# Patient Record
Sex: Female | Born: 1955 | Race: Black or African American | Hispanic: No | Marital: Married | Smoking: Never smoker
Health system: Southern US, Community
[De-identification: ages and names within clinical notes are randomized; demographics above are authoritative.]

## PROBLEM LIST (undated history)

## (undated) DIAGNOSIS — G8929 Other chronic pain: Secondary | ICD-10-CM

## (undated) DIAGNOSIS — I639 Cerebral infarction, unspecified: Secondary | ICD-10-CM

## (undated) DIAGNOSIS — R739 Hyperglycemia, unspecified: Secondary | ICD-10-CM

## (undated) DIAGNOSIS — F329 Major depressive disorder, single episode, unspecified: Secondary | ICD-10-CM

## (undated) DIAGNOSIS — D573 Sickle-cell trait: Secondary | ICD-10-CM

## (undated) DIAGNOSIS — K529 Noninfective gastroenteritis and colitis, unspecified: Secondary | ICD-10-CM

## (undated) DIAGNOSIS — E785 Hyperlipidemia, unspecified: Secondary | ICD-10-CM

## (undated) DIAGNOSIS — F419 Anxiety disorder, unspecified: Secondary | ICD-10-CM

## (undated) DIAGNOSIS — I10 Essential (primary) hypertension: Secondary | ICD-10-CM

## (undated) DIAGNOSIS — M1990 Unspecified osteoarthritis, unspecified site: Secondary | ICD-10-CM

## (undated) DIAGNOSIS — I5041 Acute combined systolic (congestive) and diastolic (congestive) heart failure: Secondary | ICD-10-CM

## (undated) DIAGNOSIS — I509 Heart failure, unspecified: Secondary | ICD-10-CM

## (undated) DIAGNOSIS — F32A Depression, unspecified: Secondary | ICD-10-CM

## (undated) HISTORY — DX: Anxiety disorder, unspecified: F41.9

## (undated) HISTORY — DX: Cerebral infarction, unspecified: I63.9

## (undated) HISTORY — DX: Depression, unspecified: F32.A

## (undated) HISTORY — DX: Essential (primary) hypertension: I10

## (undated) HISTORY — DX: Other chronic pain: G89.29

## (undated) HISTORY — DX: Sickle-cell trait: D57.3

## (undated) HISTORY — DX: Major depressive disorder, single episode, unspecified: F32.9

## (undated) HISTORY — DX: Hyperlipidemia, unspecified: E78.5

---

## 1998-05-30 ENCOUNTER — Emergency Department (HOSPITAL_COMMUNITY): Admission: EM | Admit: 1998-05-30 | Discharge: 1998-05-30 | Payer: Self-pay | Admitting: Emergency Medicine

## 2000-02-28 ENCOUNTER — Emergency Department (HOSPITAL_COMMUNITY): Admission: EM | Admit: 2000-02-28 | Discharge: 2000-02-28 | Payer: Self-pay | Admitting: *Deleted

## 2000-05-01 ENCOUNTER — Encounter: Payer: Self-pay | Admitting: Cardiovascular Disease

## 2000-05-01 ENCOUNTER — Encounter: Admission: RE | Admit: 2000-05-01 | Discharge: 2000-05-01 | Payer: Self-pay | Admitting: Cardiovascular Disease

## 2003-07-10 ENCOUNTER — Emergency Department (HOSPITAL_COMMUNITY): Admission: EM | Admit: 2003-07-10 | Discharge: 2003-07-10 | Payer: Self-pay | Admitting: Emergency Medicine

## 2003-07-10 ENCOUNTER — Encounter: Payer: Self-pay | Admitting: Emergency Medicine

## 2003-11-30 ENCOUNTER — Emergency Department (HOSPITAL_COMMUNITY): Admission: AD | Admit: 2003-11-30 | Discharge: 2003-11-30 | Payer: Self-pay | Admitting: Family Medicine

## 2003-12-12 ENCOUNTER — Emergency Department (HOSPITAL_COMMUNITY): Admission: AD | Admit: 2003-12-12 | Discharge: 2003-12-12 | Payer: Self-pay | Admitting: Family Medicine

## 2004-04-22 ENCOUNTER — Emergency Department (HOSPITAL_COMMUNITY): Admission: EM | Admit: 2004-04-22 | Discharge: 2004-04-22 | Payer: Self-pay | Admitting: Internal Medicine

## 2004-06-16 ENCOUNTER — Emergency Department (HOSPITAL_COMMUNITY): Admission: EM | Admit: 2004-06-16 | Discharge: 2004-06-17 | Payer: Self-pay | Admitting: Emergency Medicine

## 2004-08-10 ENCOUNTER — Encounter: Admission: RE | Admit: 2004-08-10 | Discharge: 2004-08-10 | Payer: Self-pay | Admitting: Nephrology

## 2005-01-06 ENCOUNTER — Emergency Department (HOSPITAL_COMMUNITY): Admission: EM | Admit: 2005-01-06 | Discharge: 2005-01-06 | Payer: Self-pay | Admitting: Emergency Medicine

## 2005-05-31 ENCOUNTER — Inpatient Hospital Stay (HOSPITAL_COMMUNITY): Admission: AD | Admit: 2005-05-31 | Discharge: 2005-05-31 | Payer: Self-pay | Admitting: Cardiology

## 2006-10-08 ENCOUNTER — Encounter (INDEPENDENT_AMBULATORY_CARE_PROVIDER_SITE_OTHER): Payer: Self-pay | Admitting: *Deleted

## 2006-10-08 LAB — CONVERTED CEMR LAB: Pap Smear: NORMAL

## 2006-10-30 ENCOUNTER — Ambulatory Visit: Payer: Self-pay | Admitting: Internal Medicine

## 2006-10-31 ENCOUNTER — Encounter: Payer: Self-pay | Admitting: *Deleted

## 2006-10-31 DIAGNOSIS — D573 Sickle-cell trait: Secondary | ICD-10-CM

## 2006-10-31 DIAGNOSIS — F3289 Other specified depressive episodes: Secondary | ICD-10-CM | POA: Insufficient documentation

## 2006-10-31 DIAGNOSIS — F411 Generalized anxiety disorder: Secondary | ICD-10-CM

## 2006-10-31 DIAGNOSIS — F329 Major depressive disorder, single episode, unspecified: Secondary | ICD-10-CM

## 2006-11-14 ENCOUNTER — Encounter (INDEPENDENT_AMBULATORY_CARE_PROVIDER_SITE_OTHER): Payer: Self-pay | Admitting: *Deleted

## 2006-11-14 ENCOUNTER — Ambulatory Visit: Payer: Self-pay | Admitting: Internal Medicine

## 2006-11-14 LAB — CONVERTED CEMR LAB
CO2: 26 meq/L (ref 19–32)
Cholesterol: 172 mg/dL (ref 0–200)
Creatinine, Ser: 0.58 mg/dL (ref 0.40–1.20)
Glucose, Bld: 99 mg/dL (ref 70–99)
HCT: 41.3 % (ref 34.4–43.3)
HDL: 52 mg/dL (ref >39)
MCV: 68.5 fL — ABNORMAL LOW (ref 78.8–100.0)
RBC: 6.03 M/uL — ABNORMAL HIGH (ref 3.79–4.96)
Total Bilirubin: 0.4 mg/dL (ref 0.3–1.2)
Total CHOL/HDL Ratio: 3.3
Triglycerides: 63 mg/dL (ref <150)
VLDL: 13 mg/dL (ref 0–40)
WBC: 6.8 10*3/uL (ref 3.7–10.0)

## 2007-01-17 ENCOUNTER — Inpatient Hospital Stay (HOSPITAL_COMMUNITY): Admission: AD | Admit: 2007-01-17 | Discharge: 2007-01-17 | Payer: Self-pay | Admitting: Family Medicine

## 2007-12-11 ENCOUNTER — Telehealth (INDEPENDENT_AMBULATORY_CARE_PROVIDER_SITE_OTHER): Payer: Self-pay | Admitting: *Deleted

## 2007-12-11 ENCOUNTER — Emergency Department (HOSPITAL_COMMUNITY): Admission: EM | Admit: 2007-12-11 | Discharge: 2007-12-11 | Payer: Self-pay | Admitting: Family Medicine

## 2008-01-05 ENCOUNTER — Ambulatory Visit: Payer: Self-pay | Admitting: Internal Medicine

## 2008-01-07 ENCOUNTER — Telehealth: Payer: Self-pay | Admitting: *Deleted

## 2008-03-14 ENCOUNTER — Ambulatory Visit: Payer: Self-pay | Admitting: Hospitalist

## 2008-03-14 DIAGNOSIS — J301 Allergic rhinitis due to pollen: Secondary | ICD-10-CM

## 2008-03-17 ENCOUNTER — Ambulatory Visit: Payer: Self-pay | Admitting: Internal Medicine

## 2008-03-17 LAB — CONVERTED CEMR LAB

## 2008-04-08 ENCOUNTER — Ambulatory Visit: Payer: Self-pay | Admitting: Internal Medicine

## 2008-04-08 LAB — CONVERTED CEMR LAB: Hgb A1c MFr Bld: 11.2 %

## 2008-05-06 ENCOUNTER — Telehealth (INDEPENDENT_AMBULATORY_CARE_PROVIDER_SITE_OTHER): Payer: Self-pay | Admitting: *Deleted

## 2008-06-04 ENCOUNTER — Emergency Department (HOSPITAL_COMMUNITY): Admission: EM | Admit: 2008-06-04 | Discharge: 2008-06-05 | Payer: Self-pay | Admitting: Emergency Medicine

## 2008-07-12 ENCOUNTER — Emergency Department (HOSPITAL_COMMUNITY): Admission: EM | Admit: 2008-07-12 | Discharge: 2008-07-12 | Payer: Self-pay | Admitting: Emergency Medicine

## 2008-07-15 ENCOUNTER — Telehealth: Payer: Self-pay | Admitting: Internal Medicine

## 2008-07-15 ENCOUNTER — Telehealth (INDEPENDENT_AMBULATORY_CARE_PROVIDER_SITE_OTHER): Payer: Self-pay | Admitting: Internal Medicine

## 2008-08-26 ENCOUNTER — Ambulatory Visit: Payer: Self-pay | Admitting: Internal Medicine

## 2008-08-26 ENCOUNTER — Encounter (INDEPENDENT_AMBULATORY_CARE_PROVIDER_SITE_OTHER): Payer: Self-pay | Admitting: Internal Medicine

## 2008-08-26 LAB — CONVERTED CEMR LAB
Blood Glucose, Fingerstick: 107
Hgb A1c MFr Bld: 6.7 %

## 2008-08-29 LAB — CONVERTED CEMR LAB
CO2: 22 meq/L (ref 19–32)
Cholesterol: 197 mg/dL (ref 0–200)
Glucose, Bld: 104 mg/dL — ABNORMAL HIGH (ref 70–99)
Sodium: 138 meq/L (ref 135–145)
Total Bilirubin: 0.3 mg/dL (ref 0.3–1.2)
Total Protein: 8.5 g/dL — ABNORMAL HIGH (ref 6.0–8.3)
Triglycerides: 83 mg/dL (ref <150)
VLDL: 17 mg/dL (ref 0–40)

## 2008-09-07 ENCOUNTER — Ambulatory Visit: Payer: Self-pay | Admitting: Infectious Disease

## 2008-09-07 LAB — CONVERTED CEMR LAB
OCCULT 2: NEGATIVE
OCCULT 3: NEGATIVE

## 2008-09-13 ENCOUNTER — Encounter (INDEPENDENT_AMBULATORY_CARE_PROVIDER_SITE_OTHER): Payer: Self-pay | Admitting: Internal Medicine

## 2008-09-16 ENCOUNTER — Ambulatory Visit (HOSPITAL_COMMUNITY): Admission: RE | Admit: 2008-09-16 | Discharge: 2008-09-16 | Payer: Self-pay | Admitting: Internal Medicine

## 2008-09-28 ENCOUNTER — Encounter (INDEPENDENT_AMBULATORY_CARE_PROVIDER_SITE_OTHER): Payer: Self-pay | Admitting: Internal Medicine

## 2008-10-12 ENCOUNTER — Encounter: Admission: RE | Admit: 2008-10-12 | Discharge: 2008-10-12 | Payer: Self-pay | Admitting: Internal Medicine

## 2009-06-08 ENCOUNTER — Ambulatory Visit: Payer: Self-pay | Admitting: Internal Medicine

## 2009-06-08 ENCOUNTER — Encounter: Payer: Self-pay | Admitting: Internal Medicine

## 2009-06-08 LAB — CONVERTED CEMR LAB
Blood Glucose, Fingerstick: 409
CO2: 23 meq/L (ref 19–32)
Calcium: 9.4 mg/dL (ref 8.4–10.5)
Creatinine, Urine: 28 mg/dL
Hgb A1c MFr Bld: >14 %
Microalb Creat Ratio: 90.4 mg/g — ABNORMAL HIGH (ref 0.0–30.0)
Microalb, Ur: 2.53 mg/dL — ABNORMAL HIGH (ref 0.00–1.89)
Sodium: 134 meq/L — ABNORMAL LOW (ref 135–145)

## 2009-06-22 ENCOUNTER — Encounter: Payer: Self-pay | Admitting: Internal Medicine

## 2009-06-22 ENCOUNTER — Ambulatory Visit: Payer: Self-pay | Admitting: Internal Medicine

## 2009-06-22 LAB — CONVERTED CEMR LAB: Blood Glucose, Fingerstick: 147

## 2009-07-24 ENCOUNTER — Ambulatory Visit: Payer: Self-pay | Admitting: Internal Medicine

## 2009-07-24 DIAGNOSIS — E785 Hyperlipidemia, unspecified: Secondary | ICD-10-CM | POA: Insufficient documentation

## 2009-07-26 ENCOUNTER — Telehealth: Payer: Self-pay | Admitting: Licensed Clinical Social Worker

## 2009-07-30 LAB — CONVERTED CEMR LAB
ALT: 17 units/L (ref 0–35)
AST: 18 units/L (ref 0–37)
Alkaline Phosphatase: 67 units/L (ref 39–117)
Creatinine, Ser: 0.55 mg/dL (ref 0.40–1.20)
HDL: 61 mg/dL (ref >39)
Total Bilirubin: 0.4 mg/dL (ref 0.3–1.2)
Total CHOL/HDL Ratio: 3.3
VLDL: 20 mg/dL (ref 0–40)

## 2009-08-07 ENCOUNTER — Ambulatory Visit: Payer: Self-pay | Admitting: Infectious Diseases

## 2009-08-07 ENCOUNTER — Encounter: Payer: Self-pay | Admitting: Licensed Clinical Social Worker

## 2009-08-07 LAB — CONVERTED CEMR LAB: Blood Glucose, Home Monitor: 1 mg/dL

## 2009-08-08 ENCOUNTER — Telehealth (INDEPENDENT_AMBULATORY_CARE_PROVIDER_SITE_OTHER): Payer: Self-pay | Admitting: *Deleted

## 2009-08-29 ENCOUNTER — Ambulatory Visit: Payer: Self-pay | Admitting: Internal Medicine

## 2009-10-05 ENCOUNTER — Telehealth (INDEPENDENT_AMBULATORY_CARE_PROVIDER_SITE_OTHER): Payer: Self-pay | Admitting: *Deleted

## 2009-12-25 ENCOUNTER — Telehealth (INDEPENDENT_AMBULATORY_CARE_PROVIDER_SITE_OTHER): Payer: Self-pay | Admitting: Internal Medicine

## 2010-06-18 ENCOUNTER — Telehealth: Payer: Self-pay | Admitting: Internal Medicine

## 2010-06-26 ENCOUNTER — Ambulatory Visit: Payer: Self-pay | Admitting: Internal Medicine

## 2010-06-26 DIAGNOSIS — D239 Other benign neoplasm of skin, unspecified: Secondary | ICD-10-CM | POA: Insufficient documentation

## 2010-06-26 LAB — CONVERTED CEMR LAB: Blood Glucose, Fingerstick: 209

## 2010-06-29 LAB — CONVERTED CEMR LAB
Chlamydia, DNA Probe: NEGATIVE
GC Probe Amp, Genital: NEGATIVE
Gardnerella vaginalis: NEGATIVE

## 2010-11-21 ENCOUNTER — Telehealth: Payer: Self-pay | Admitting: Internal Medicine

## 2010-12-16 ENCOUNTER — Encounter: Payer: Self-pay | Admitting: Internal Medicine

## 2010-12-16 ENCOUNTER — Encounter: Payer: Self-pay | Admitting: Cardiology

## 2010-12-19 ENCOUNTER — Ambulatory Visit: Admission: RE | Admit: 2010-12-19 | Discharge: 2010-12-19 | Payer: Self-pay | Source: Home / Self Care

## 2010-12-19 LAB — CONVERTED CEMR LAB
ALT: 15 units/L (ref 0–35)
Alkaline Phosphatase: 63 units/L (ref 39–117)
Creatinine, Ser: 0.52 mg/dL (ref 0.40–1.20)
Glucose, Bld: 177 mg/dL — ABNORMAL HIGH (ref 70–99)
Hgb A1c MFr Bld: 9.8 %
Sodium: 135 meq/L (ref 135–145)
Total Bilirubin: 0.3 mg/dL (ref 0.3–1.2)
Total Protein: 8.5 g/dL — ABNORMAL HIGH (ref 6.0–8.3)

## 2010-12-25 NOTE — Progress Notes (Signed)
Summary: Refill/gh  Phone Note Refill Request Message from:  Pharmacy on December 25, 2009 11:59 AM  Refills Requested: Medication #1:  GLIPIZIDE 2.5 MG XR24H-TAB Take 1 tablet by mouth once a day Call from pharmacy say that they do not carry the 2.5 mg tablets.  5 mg tablets cannot be broken.  What should they do.   Method Requested: Fax to Local Pharmacy Initial call taken by: Angelina Ok RN,  December 25, 2009 12:00 PM  Follow-up for Phone Call        She can take the 5 mg tablets.  I have not seen her but it appears from her record her last a1c would support a slightly higher dose anyway.  However, please let herknow she is overdue for an appt.  Follow-up by: Joaquin Courts  MD,  December 25, 2009 12:03 PM  Additional Follow-up for Phone Call Additional follow up Details #1::        Rx faxed to pharmacy.  Flag to [facility] to schedule an appointment. Additional Follow-up by: Angelina Ok RN,  December 28, 2009 1:47 PM    New/Updated Medications: GLIPIZIDE 5 MG XR24H-TAB (GLIPIZIDE) Take 1 tablet by mouth once a day Prescriptions: GLIPIZIDE 5 MG XR24H-TAB (GLIPIZIDE) Take 1 tablet by mouth once a day  #30 x 0   Entered and Authorized by:   Joaquin Courts  MD   Signed by:   Joaquin Courts  MD on 12/25/2009   Method used:   Faxed to ...       [facility] (retail)       [address]       [place], [place]  [address]       Ph: [phone]       Fax: [phone]   RxID:   [id number]

## 2010-12-25 NOTE — Progress Notes (Signed)
Summary: med refill/gp  #1  Phone Note Refill Request Message from:  Patient on June 18, 2010 11:32 AM  Refills Requested: Medication #1:  XANAX 1 MG TABS Take 1 tablet by mouth two times a day as needed  Medication #2:  METFORMIN HCL 1000 MG  TABS Take 1 tablet by mouth two times a day  Medication #3:  LISINOPRIL 10 MG TABS Take 1 tablet by mouth once a day  Medication #4:  CETIRIZINE HCL 10 MG  TABS Take 1 tablet by mouth once a day Pt. has been scheduled for Aug 23 w/Dr. Rogelia Boga.   Method Requested: Telephone to Pharmacy Initial call taken by: Chinita Pester RN,  June 18, 2010 11:32 AM  Follow-up for Phone Call        I faxed all meds except Xanax to pharmacy. I made a mistake with the Xanax but don;t know how to delete the Rx with refills.  Please call Xanax 1 mg #60 with no refills to Pharmacy.  Thanks for making pt an appt. Follow-up by: Blanch Media MD,  June 18, 2010 1:21 PM  Additional Follow-up for Phone Call Additional follow up Details #1::        Xanax w/no refills called to [facility]; pt. was called and made awared. Additional Follow-up by: Chinita Pester RN,  June 19, 2010 9:55 AM    Prescriptions: Prudy Feeler 1 MG TABS (ALPRAZOLAM) Take 1 tablet by mouth two times a day as needed  #60 x 0   Entered and Authorized by:   Blanch Media MD   Signed by:   Blanch Media MD on 06/18/2010   Method used:   Telephoned to ...       [facility] (retail)       [address]       [place], [place]  [address]       Ph: [phone]       Fax: [phone]   RxID:   [id number] Prudy Feeler 1 MG TABS (ALPRAZOLAM) Take 1 tablet by mouth two times a day as needed  #60 x 3   Entered and Authorized by:   Blanch Media MD   Signed by:   Blanch Media MD on 06/18/2010   Method used:   Telephoned to ...       [facility] (retail)       [address]       [place], [place]   [address]       Ph: [phone]       Fax: [phone]   RxID:   [id number] GLIPIZIDE 5 MG XR24H-TAB (GLIPIZIDE) Take 1 tablet by mouth once a day  #30 x 0   Entered and Authorized by:   Blanch Media MD   Signed by:   Blanch Media MD on 06/18/2010   Method used:   Faxed to ...       [facility] (retail)       [address]       [place], [place]  [address]       Ph: [phone]       Fax: [phone]   RxID:   [id number] SINGULAIR 10 MG TABS (MONTELUKAST SODIUM) Take 1 tablet by mouth once a day  #30 x 0   Entered and Authorized by:   Blanch Media MD   Signed by:   Blanch Media MD on 06/18/2010   Method used:  Faxed to ...       [facility] (retail)       [address]       [place], [place]  [address]       Ph: [phone]       Fax: [phone]   RxID:   [id number] CETIRIZINE HCL 10 MG  TABS (CETIRIZINE HCL) Take 1 tablet by mouth once a day  #30 x 0   Entered and Authorized by:   Blanch Media MD   Signed by:   Blanch Media MD on 06/18/2010   Method used:   Faxed to ...       [facility] (retail)       [address]       [place], [place]  [address]       Ph: [phone]       Fax: [phone]   RxID:   [id number] LISINOPRIL 10 MG TABS (LISINOPRIL) Take 1 tablet by mouth once a day  #31 x 0   Entered and Authorized by:   Blanch Media MD   Signed by:   Blanch Media MD on 06/18/2010   Method used:   Faxed to ...       [facility] (retail)       [address]       [place], [place]  [address]       Ph: [phone]       Fax: [phone]   RxID:   [id number] METFORMIN HCL 1000 MG  TABS (METFORMIN HCL) Take 1 tablet by mouth two times a day  #60 x 0   Entered and Authorized by:   Blanch Media MD   Signed by:   Blanch Media MD on 06/18/2010   Method  used:   Faxed to ...       [facility] (retail)       [address]       [place], [place]  [address]       Ph: [phone]       Fax: [phone]   RxID:   [id number]

## 2010-12-25 NOTE — Progress Notes (Signed)
Summary: med refill/gp  #2  Phone Note Refill Request Message from:  Patient on June 18, 2010 11:34 AM  Refills Requested: Medication #1:  GLIPIZIDE 5 MG XR24H-TAB Take 1 tablet by mouth once a day  Medication #2:  SINGULAIR 10 MG TABS Take 1 tablet by mouth once a day  Method Requested: Telephone to Pharmacy Initial call taken by: Morrison Old RN,  June 18, 2010 11:35 AM

## 2010-12-25 NOTE — Assessment & Plan Note (Signed)
Summary: acute/butcher/vaginal discharge/ch   Vital Signs:  Patient profile:   55 year old female Height:      65 inches (165.10 cm) Weight:      123.7 pounds (56.23 kg) BMI:     20.66 Temp:     98.1 degrees F Pulse rate:   98 / minute BP sitting:   153 / 86  (right arm) Cuff size:   regular  Vitals Entered By: Dorie Rank RN (June 26, 2010 2:41 PM)  Serial Vital Signs/Assessments:  Time      Position  BP       Pulse  Resp  Temp     By 2:54 PM   L Arm     148/84                         Dorie Rank RN   Diabetic Foot Exam Last Podiatry Exam Date: 04/08/2008 Foot Inspection Is there a history of a foot ulcer?              No Is there a foot ulcer now?              No Can the patient see the bottom of their feet?          Yes Are the shoes appropriate in style and fit?          Yes Is there swelling or an abnormal foot shape?          No Are the toenails long?                No Are the toenails thick?                No Are the toenails ingrown?              No Is there heavy callous build-up?              No Is there pain in the calf muscle (Intermittent claudication) when walking?    NoIs there a claw toe deformity?              No Is there elevated skin temperature?            No Is there limited ankle dorsiflexion?            No Is there foot or ankle muscle weakness?            No  Diabetic Foot Care Education Patient educated on appropriate care of diabetic feet.  Comments: takes care of own feet and goes to nail salon - wearing protective slip on shoes   10-g (5.07) Semmes-Weinstein Monofilament Test Performed by: Dorie Rank RN          Right Foot          Left Foot Visual Inspection     normal           normal Site 1         normal         normal Site 2         normal         normal Site 3         normal         normal Site 4         normal         normal Site 5         normal  normal Site 6         normal         normal Site 9         normal          normal  Impression      normal         normal  Legend:  Site 1 = Plantar aspect of first toe (center of pad) Site 2 = Plantar aspect of third toe (center of pad) Site 3 = Plantar aspect of fifth toe (center of pad) Site 4 = Plantar aspect of first metatarsal head Site 5 = Plantar aspect of third metatarsal head Site 6 = Plantar aspect of fifth metatarsal head Site 7 = Plantar aspect of medial midfoot Site 8 = Plantar aspect of lateral midfoot Site 9 = Plantar aspect of heel Site 10 = dorsal aspect of foot between the base of the first and second toes   Result is Abnormal if patient was unable to perceive the monofilament at site indicated.   CC: "eyes bothering me" - "thought I had the pink eye - my left eye was stuck together but I can see a lot better now"- ran out of several meds for at least a week - started back on them last week, Depression Is Patient Diabetic? Yes Did you bring your meter with you today? No Pain Assessment Patient in pain? no      Nutritional Status BMI of 19 -24 = normal CBG Result 209  Have you ever been in a relationship where you felt threatened, hurt or afraid?No   Does patient need assistance? Functional Status Self care Ambulation Normal Comments also c/o vaginal discharge about 1 week - yellow and clear needs meds sent to [facility] on [address] - needs meds refills   Primary Care Provider:  Joaquin Courts  MD  CC:  "eyes bothering me" - "thought I had the pink eye - my left eye was stuck together but I can see a lot better now"- ran out of several meds for at least a week - started back on them last week and Depression.  History of Present Illness: 55 yo female with PMH of HTN, DM type II, HLP, Depression/Anxiety, DJD presents for pink eye and vaginal discharge.  Pink eye- started about 1 week ago on her left eye, red, watery eyes and runny nose, itching, kept rubbing, white crusty discharge when she wakes upon Saturday morning, sunlight  does bother her a little.   She uses her drops that was prescribed for allergy and her symptoms improved.   Vaginal discharge- 1 wk duration, pouring out like water, clear and yellow with odor, sore & itch, wetting her panties.  Burning sensation, no dysuria.  Urinary frequency-4 times per day.  Sexually active with 1 partner.    DM- she ran out of her meds: she ran out of metformin and Glipizide for about 1 week.  CBG 209, HbA1c >14.  She reports of BS at home 140s.  She said she knows all the soda she drink makes her sugar run very high.  Also complains of 5x16mm mole on her right cheek and would like to see a dermatologist.    Depression History:      The patient denies a depressed mood most of the day and a diminished interest in her usual daily activities.        Comments:  "sometimes my nerves make me upset but most days I am OK".  Lipid Management History:      Positive NCEP/ATP III risk factors include female age 40 years old or older, diabetes, and hypertension.  Negative NCEP/ATP III risk factors include HDL cholesterol greater than 60 and non-tobacco-user status.      Allergies: No Known Drug Allergies  Review of Systems  The patient denies anorexia, fever, weight loss, weight gain, vision loss, decreased hearing, hoarseness, chest pain, syncope, dyspnea on exertion, peripheral edema, prolonged cough, headaches, hemoptysis, abdominal pain, melena, hematochezia, severe indigestion/heartburn, hematuria, incontinence, genital sores, muscle weakness, suspicious skin lesions, transient blindness, difficulty walking, depression, unusual weight change, abnormal bleeding, enlarged lymph nodes, angioedema, breast masses, and testicular masses.         vaginal discharge, pink eye (L)  Physical Exam  General:  alert, well-developed, well-nourished, and well-hydrated.  alert, well-developed, well-nourished, and well-hydrated.   Head:  normocephalic and atraumatic.  normocephalic and  atraumatic.   Eyes:  vision grossly intact, pupils equal, pupils round, and pupils reactive to light.  vision grossly intact, pupils equal, pupils round, and pupils reactive to light.   Ears:  R ear normal and L ear normal.  R ear normal and L ear normal.   Nose:  no external deformity.  no external deformity.   Mouth:  good dentition and no gingival abnormalities.  good dentition and no gingival abnormalities.   Neck:  supple, full ROM, and no masses.  supple, full ROM, and no masses.   Chest Wall:  no deformities and no tenderness.  no deformities and no tenderness.   Breasts:  skin/areolae normal and no masses.  skin/areolae normal and no masses.   Lungs:  normal respiratory effort, no intercostal retractions, no accessory muscle use, normal breath sounds, no crackles, and no wheezes.  normal respiratory effort, no intercostal retractions, no accessory muscle use, normal breath sounds, no crackles, and no wheezes.   Heart:  normal rate, regular rhythm, no murmur, no gallop, no rub, and no JVD.  normal rate, regular rhythm, no murmur, no gallop, no rub, and no JVD.   Abdomen:  soft, non-tender, normal bowel sounds, no distention, and no guarding.  soft, non-tender, normal bowel sounds, no distention, and no guarding.   Genitalia:  no external lesions, white discharge, erythematous and blood around cervix os.  No supra-pubis tenderness. Msk:  No deformity or scoliosis noted of thoracic or lumbar spine.   Extremities:  No clubbing, cyanosis, edema, or deformity noted with normal full range of motion of all joints.   Neurologic:  alert & oriented X3, cranial nerves II-XII intact, strength normal in all extremities, gait normal, and DTRs symmetrical and normal.  Decrease sensation to lower extremities bilaterally. Abnormal monofilament test. Skin:  turgor normal, color normal, no rashes, and no edema.  turgor normal, color normal, no rashes, and no edema.   5x74mm hyperpigmented, lobulated ,irregular  border mole on right cheek.   Cervical Nodes:  no anterior cervical adenopathy and no posterior cervical adenopathy.  no anterior cervical adenopathy and no posterior cervical adenopathy.   Psych:  Oriented X3, memory intact for recent and remote, normally interactive, good eye contact, not anxious appearing, and not depressed appearing.  Oriented X3, memory intact for recent and remote, normally interactive, good eye contact, not anxious appearing, and not depressed appearing.    Diabetes Management Exam:    Foot Exam (with socks and/or shoes not present):       Sensory-Monofilament:          Left foot: normal  Right foot: normal   Impression & Recommendations:  Problem # 1:  VAGINAL DISCHARGE (ICD-623.5) Assessment New Most likely yeast infection given she has uncontrolled DM with HbA1c of >14 today.  I will call the patient for test results: Wet prep, chlamydia and Gonorrhea probe and will prescribe appropriate medications depending on the results.  I also discussed with patient the risk of recurrent vaginal yeast infection if she does not get her DM under good control.  Advised her to stop drinking all the sodas and drink more water instead.  Orders: T-Wet Prep by Molecular Probe [id number]) T-Chlamydia & GC Probe, Genital ([id number])  Problem # 2:  UNSPECIFIED CONJUNCTIVITIS (ICD-372.30) Assessment: New Patient has allergic rhinitis and her conjunctivitis improves with allergy medications that she is taking at home. Her conjunctiva was slight pink and was not inflammed with no discharge.   I advised her to continue Singulair, Zyrtec, and Flonase.  If symptoms worsen, she can call clinic to see me sooner.  Problem # 3:  DIABETES MELLITUS, TYPE II (ICD-250.00) Assessment: Unchanged Her HbA1c today was >14.  Patient reported running out of her meds in the past week due to financial problem.  I discussed the option of insulin; however, patient does not want to be on  insulin at this time.  I advised her to make an appointment to see Lupita Leash, Diabetic Educator so she can discuss more in depth about how we can help her manage DM better.  I will increase her Glipizide to 10mg  Xr by mouth daily and follow-up in 3 months.     Her updated medication list for this problem includes:    Metformin Hcl 1000 Mg Tabs (Metformin hcl) .Marland Kitchen... Take 1 tablet by mouth two times a day    Lisinopril 10 Mg Tabs (Lisinopril) .Marland Kitchen... Take 1 tablet by mouth once a day    Glipizide 5 Mg Xr24h-tab (Glipizide) .Marland Kitchen... Take 1 tablet by mouth once a day  Orders: T-Hgb A1C (in-house) ([id number]) T- Capillary Blood Glucose (30865)  Problem # 4:  HYPERTENSION (ICD-401.9) Assessment: Deteriorated BP today 153/86.  Rechecked 150/80.  Patient reported not taking her BP med in the past week.  Continue to take Lisinopril 10mg  and will need to adjust medication at next office visit if her BP is still elevated.    Her updated medication list for this problem includes:    Lisinopril 10 Mg Tabs (Lisinopril) .Marland Kitchen... Take 1 tablet by mouth once a day  Complete Medication List: 1)  Xanax 1 Mg Tabs (Alprazolam) .... Take 1 tablet by mouth two times a day as needed 2)  Metformin Hcl 1000 Mg Tabs (Metformin hcl) .... Take 1 tablet by mouth two times a day 3)  Lisinopril 10 Mg Tabs (Lisinopril) .... Take 1 tablet by mouth once a day 4)  Cetirizine Hcl 10 Mg Tabs (Cetirizine hcl) .... Take 1 tablet by mouth once a day 5)  Fluticasone Propionate 50 Mcg/act Susp (Fluticasone propionate) .... 2 sprays per nostril once a day 6)  Singulair 10 Mg Tabs (Montelukast sodium) .... Take 1 tablet by mouth once a day 7)  Pravachol 20 Mg Tabs (Pravastatin sodium) .... Take 1 tab by mouth at bedtime 8)  Glipizide 5 Mg Xr24h-tab (Glipizide) .... Take 1 tablet by mouth once a day 9)  Truetrack Test Strp (Glucose blood) .... Use to test blood sugar once daily in the morning before breakfast 10)  Accu-chek Softclix Lancets Misc  (Lancets) .... Use to check blood sugar  Other  Orders: Dermatology Referral (Derma)  Lipid Assessment/Plan:      Based on NCEP/ATP III, the patient's risk factor category is "history of diabetes".  The patient's lipid goals are as follows: Total cholesterol goal is 200; LDL cholesterol goal is 100; HDL cholesterol goal is 40; Triglyceride goal is 150.  Her LDL cholesterol goal has not been met.  Secondary causes for hyperlipidemia have been ruled out.  She has been counseled on adjunctive measures for lowering her cholesterol and has been provided with dietary instructions.    Patient Instructions: 1)  1. Continue taking zyrtec, singulair, flonase for allergic rhinitis 2)  2. Will call if test result is positive 3)  3. Increase Glipizide to 10mg  once daily 4)  4. Make an appointment with Lupita Leash, Diabetic educator 5)  5. Follow up in 3 months Process Orders Check Orders Results:     [facility]: Order checked:     5990 -- T-Chlamydia & GC Probe, Genital -- ABN required due to diagnosis (CPT: [address])     [id number] -- T-Wet Prep by Molecular Probe -- ABN required due to diagnosis (CPT: 9141056864) Tests Sent for requisitioning (June 27, 2010 9:55 AM):     06/26/2010: [facility] -- T-Wet Prep by Molecular Probe [phone] (signed)     06/26/2010: [facility] -- T-Chlamydia & GC Probe, Genital [[id number]] (signed)     Prevention & Chronic Care Immunizations   Influenza vaccine: Not documented    Tetanus booster: Not documented    Pneumococcal vaccine: Not documented  Colorectal Screening   Hemoccult: Not documented   Hemoccult action/deferral: Ordered  (06/22/2009)    Colonoscopy: Not documented   Colonoscopy action/deferral: Deferred  (06/22/2009)  Other Screening   Pap smear: NEGATIVE FOR INTRAEPITHELIAL LESIONS OR MALIGNANCY.  (06/22/2009)   Pap smear action/deferral: Ordered  (06/22/2009)    Mammogram:  BI-RADS CATEGORY 2:  Benign finding(s).^MM DIGITAL DIAGNOSTIC BILAT LTD  (10/12/2008)   Smoking status: never  (07/24/2009)  Diabetes Mellitus   HgbA1C: >14.0  (06/26/2010)   HgbA1C action/deferral: Ordered  (06/26/2010)    Eye exam: No diabetic retinopathy.     (09/13/2008)   Eye exam due: 09/2009    Foot exam: yes  (06/26/2010)   Foot exam action/deferral: Do today   High risk foot: Not documented   Foot care education: Done  (06/26/2010)    Urine microalbumin/creatinine ratio: 90.4  (06/08/2009)  Lipids   Total Cholesterol: 203  (07/24/2009)   LDL: 122  (07/24/2009)   LDL Direct: Not documented   HDL: 61  (07/24/2009)   Triglycerides: 102  (07/24/2009)    SGOT (AST): 18  (07/24/2009)   SGPT (ALT): 17  (07/24/2009)   Alkaline phosphatase: 67  (07/24/2009)   Total bilirubin: 0.4  (07/24/2009)  Hypertension   Last Blood Pressure: 153 / 86  (06/26/2010)   Serum creatinine: 0.55  (07/24/2009)   Serum potassium 4.1  (07/24/2009)  Self-Management Support :   Personal Goals (by the next clinic visit) :     Personal A1C goal: 7  (06/26/2010)     Personal blood pressure goal: 130/80  (06/26/2010)     Personal LDL goal: 100  (06/26/2010)    Patient will work on the following items until the next clinic visit to reach self-care goals:     Medications and monitoring: examine my feet every day  (06/26/2010)     Eating: drink diet soda or water instead of juice or soda, eat more vegetables, eat foods  that are low in salt, eat baked foods instead of fried foods  (06/26/2010)     Activity: take a 30 minute walk every day, take the stairs instead of the elevator  (06/26/2010)     Other: drinks some regular sodas and some fried foods  (06/26/2010)    Diabetes self-management support: Pre-printed educational material, Written self-care plan, Resources for patients handout  (06/26/2010)   Diabetes care plan printed   Last diabetes self-management training by diabetes educator:  08/29/2009   Last medical nutrition therapy: 04/08/2008    Hypertension self-management support: Pre-printed educational material, Written self-care plan, Resources for patients handout  (06/26/2010)   Hypertension self-care plan printed.    Lipid self-management support: Pre-printed educational material, Written self-care plan, Resources for patients handout  (06/26/2010)   Lipid self-care plan printed.      Resource handout printed.   Nursing Instructions: HgbA1C today (see order) CBG today (see order) Diabetic foot exam today    Laboratory Results   Blood Tests   Date/Time Received: June 26, 2010 3:04 PM  Date/Time Reported: Alric Quan  June 26, 2010 3:04 PM   HGBA1C: >14.0%   (Normal Range: Non-Diabetic - 3-6%   Control Diabetic - 6-8%) CBG Random:: 209mg /dL     Process Orders Check Orders Results:     [facility]: Order checked:     5990 -- T-Chlamydia & GC Probe, Genital -- ABN required due to diagnosis (CPT: 04540,98119)     14782 -- T-Wet Prep by Molecular Probe -- ABN required due to diagnosis (CPT: [phone]) Tests Sent for requisitioning (June 27, 2010 9:55 AM):     06/26/2010: [facility] -- T-Wet Prep by Molecular Probe [phone] (signed)     06/26/2010: [facility] -- T-Chlamydia & GC Probe, Genital [[id number]] (signed)

## 2010-12-27 NOTE — Progress Notes (Signed)
Summary: med refill/gp  Phone Note Refill Request Message from:  Fax from Pharmacy on November 21, 2010 4:24 PM  Refills Requested: Medication #1:  XANAX 1 MG TABS Take 1 tablet by mouth two times a day as needed   Last Refilled: 06/20/2010 Last appt. 06/26/10.   Method Requested: Telephone to Pharmacy Initial call taken by: Morrison Old RN,  November 21, 2010 4:24 PM  Follow-up for Phone Call        No showed / cancelled last two appts with me. Xanax has not been addressed in over one year. Will give 10 and ask that pt be assessed. I sent a flag to Ms Cyndi Bender to schedule an appt.  Follow-up by: Larey Dresser MD,  November 21, 2010 8:36 PM  Additional Follow-up for Phone Call Additional follow up Details #1::        Rx called to [facility]. Additional Follow-up by: Morrison Old RN,  November 22, 2010 12:10 PM    Prescriptions: Duanne Moron 1 MG TABS (ALPRAZOLAM) Take 1 tablet by mouth two times a day as needed  #10 x 0   Entered and Authorized by:   Larey Dresser MD   Signed by:   Larey Dresser MD on 11/21/2010   Method used:   Telephoned to ...       [facility] [phone]* (retail)       [address]       [place], [place]  [address]       Ph: [phone]       Fax: [phone]   RxID:   [id number]
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
## 2011-02-08 LAB — GLUCOSE, CAPILLARY: Glucose-Capillary: 209 mg/dL — ABNORMAL HIGH (ref 70–99)

## 2011-03-03 LAB — GLUCOSE, CAPILLARY: Glucose-Capillary: 147 mg/dL — ABNORMAL HIGH (ref 70–99)

## 2011-03-22 ENCOUNTER — Encounter: Payer: Self-pay | Admitting: Internal Medicine

## 2011-03-22 DIAGNOSIS — Z0000 Encounter for general adult medical examination without abnormal findings: Secondary | ICD-10-CM | POA: Insufficient documentation

## 2011-03-22 DIAGNOSIS — I10 Essential (primary) hypertension: Secondary | ICD-10-CM | POA: Insufficient documentation

## 2011-03-22 DIAGNOSIS — G8929 Other chronic pain: Secondary | ICD-10-CM | POA: Insufficient documentation

## 2011-03-22 DIAGNOSIS — E119 Type 2 diabetes mellitus without complications: Secondary | ICD-10-CM | POA: Insufficient documentation

## 2011-04-09 ENCOUNTER — Ambulatory Visit: Payer: Self-pay | Admitting: Internal Medicine

## 2011-07-15 ENCOUNTER — Other Ambulatory Visit: Payer: Self-pay | Admitting: Internal Medicine

## 2011-07-15 NOTE — Telephone Encounter (Signed)
Message to scheduler to schedule pt with an appointment.

## 2011-07-15 NOTE — Telephone Encounter (Signed)
I have never seen pt. Last seen 1/12 and asked for 3 month F/U but no showed. Give 2 month supply and ask that she be seen. May have to be seen by resident as my CC are limited.

## 2011-08-16 LAB — POCT URINALYSIS DIP (DEVICE)
Bilirubin Urine: NEGATIVE
Glucose, UA: >=1000 — AB
Nitrite: NEGATIVE

## 2011-08-20 ENCOUNTER — Ambulatory Visit: Payer: Self-pay | Admitting: Internal Medicine

## 2011-08-26 LAB — GLUCOSE, CAPILLARY: Glucose-Capillary: 107 — ABNORMAL HIGH

## 2011-09-11 ENCOUNTER — Other Ambulatory Visit: Payer: Self-pay | Admitting: Internal Medicine

## 2011-09-12 NOTE — Telephone Encounter (Signed)
No showed last two appts with me. Last seen Jan. DM uncontrolled. Lipids uncontrolled. HTN uncontrolled. Got #60 with 2 refills in Jan so Xanax use really isn't excessive. Will give #30 Xanax and require that she be seen for chronic med mgmt before getting more meds

## 2011-09-13 NOTE — Telephone Encounter (Signed)
Called to pharm, they will add a note for appt, will also send to schedulers for appt

## 2011-09-13 NOTE — Telephone Encounter (Signed)
Called pt w/ appt 11/20 at 1015, pt states she does not need to be "running to the md every time she turns around", " i don't have any serious health problems" it is explained that high blood pressure and diabetes can be very serious and so she can keep them under control and not be hospitalized that regular appts w/ md are a good idea, she states she will come to appt but don't expect her come all the time!

## 2011-09-13 NOTE — Telephone Encounter (Signed)
Noted. Thanks.

## 2011-10-15 ENCOUNTER — Ambulatory Visit: Payer: Self-pay | Admitting: Internal Medicine

## 2011-10-29 ENCOUNTER — Ambulatory Visit (INDEPENDENT_AMBULATORY_CARE_PROVIDER_SITE_OTHER): Payer: Medicare Other | Admitting: Internal Medicine

## 2011-10-29 ENCOUNTER — Encounter: Payer: Self-pay | Admitting: Internal Medicine

## 2011-10-29 DIAGNOSIS — F329 Major depressive disorder, single episode, unspecified: Secondary | ICD-10-CM

## 2011-10-29 DIAGNOSIS — Z0000 Encounter for general adult medical examination without abnormal findings: Secondary | ICD-10-CM

## 2011-10-29 DIAGNOSIS — F411 Generalized anxiety disorder: Secondary | ICD-10-CM

## 2011-10-29 DIAGNOSIS — E119 Type 2 diabetes mellitus without complications: Secondary | ICD-10-CM

## 2011-10-29 DIAGNOSIS — E785 Hyperlipidemia, unspecified: Secondary | ICD-10-CM

## 2011-10-29 DIAGNOSIS — I10 Essential (primary) hypertension: Secondary | ICD-10-CM

## 2011-10-29 LAB — COMPLETE METABOLIC PANEL WITH GFR
Alkaline Phosphatase: 69 U/L (ref 39–117)
CO2: 22 mEq/L (ref 19–32)
Chloride: 97 mEq/L (ref 96–112)
Creat: 0.66 mg/dL (ref 0.50–1.10)
GFR, Est African American: >89 mL/min
GFR, Est Non African American: >89 mL/min
Glucose, Bld: 264 mg/dL — ABNORMAL HIGH (ref 70–99)
Potassium: 4.1 mEq/L (ref 3.5–5.3)
Sodium: 135 mEq/L (ref 135–145)
Total Bilirubin: 0.4 mg/dL (ref 0.3–1.2)
Total Protein: 8.3 g/dL (ref 6.0–8.3)

## 2011-10-29 LAB — LIPID PANEL
HDL: 55 mg/dL (ref >39)
Total CHOL/HDL Ratio: 4.2 Ratio
Triglycerides: 128 mg/dL (ref <150)
VLDL: 26 mg/dL (ref 0–40)

## 2011-10-29 LAB — GLUCOSE, CAPILLARY: Glucose-Capillary: 320 mg/dL — ABNORMAL HIGH (ref 70–99)

## 2011-10-29 MED ORDER — LISINOPRIL 10 MG PO TABS: 10.0000 mg | ORAL_TABLET | Freq: Every day | ORAL | Status: DC

## 2011-10-29 MED ORDER — GLIPIZIDE ER 10 MG PO TB24: 20.0000 mg | ORAL_TABLET | Freq: Every day | ORAL | Status: DC

## 2011-10-29 MED ORDER — ALPRAZOLAM 1 MG PO TABS: 1.0000 mg | ORAL_TABLET | Freq: Every evening | ORAL | Status: DC | PRN

## 2011-10-29 NOTE — Progress Notes (Signed)
  Subjective:    Patient ID: Kristen Lozano, female    DOB: October 13, 1956, 55 y.o.   MRN: [id number]  HPI  This is my first visit with Kristen Lozano. Please see the A&P for the status of the pt's chronic medical problems.   Review of Systems  Constitutional: Positive for diaphoresis and unexpected weight change. Negative for chills.  HENT: Positive for hearing loss, sneezing and dental problem.   Musculoskeletal: Positive for arthralgias.  Skin:       Multiple skin tags  Psychiatric/Behavioral: Positive for sleep disturbance. Negative for agitation.       Objective:   Physical Exam  Constitutional: She is oriented to person, place, and time. She appears well-developed and well-nourished. No distress.  HENT:  Head: Normocephalic and atraumatic.  Right Ear: External ear normal.  Left Ear: External ear normal.  Nose: Nose normal.       Poor detention, many missing teeth  Eyes: Conjunctivae and EOM are normal. Pupils are equal, round, and reactive to light.  Neck: No thyromegaly present.  Cardiovascular: Normal rate, regular rhythm and normal heart sounds.   Pulmonary/Chest: Breath sounds normal. No respiratory distress. She has no wheezes.  Musculoskeletal: She exhibits no edema.  Lymphadenopathy:    She has no cervical adenopathy.  Neurological: She is alert and oriented to person, place, and time.  Skin: Skin is warm and dry. She is not diaphoretic.       Multiple skin tags  Psychiatric: She has a normal mood and affect. Her behavior is normal. Judgment and thought content normal.          Assessment & Plan:

## 2011-10-29 NOTE — Patient Instructions (Signed)
1. Stop drinking regular sodas 2. See me in March 2013 3. Take TWO sugar pills each day 3. We will need to work more on your sugar control in March

## 2011-10-30 LAB — MICROALBUMIN / CREATININE URINE RATIO: Microalb, Ur: 3.23 mg/dL — ABNORMAL HIGH (ref 0.00–1.89)

## 2011-10-31 ENCOUNTER — Telehealth: Payer: Self-pay | Admitting: Licensed Clinical Social Worker

## 2011-10-31 NOTE — Telephone Encounter (Signed)
Faxed form to DSS to change Medicaid provider from [facility] back to [facility].

## 2011-11-01 MED ORDER — PRAVASTATIN SODIUM 20 MG PO TABS: 20.0000 mg | ORAL_TABLET | Freq: Every day | ORAL | Status: DC

## 2011-11-01 NOTE — Assessment & Plan Note (Addendum)
Her depression screening was 0. She is tolerating the deaths of her sisters well.
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
## 2011-11-01 NOTE — Assessment & Plan Note (Signed)
Refuses flu vaccine - "makes me sick" Eye - problem with medicaid card. Will retry next appt. MMG - not sure she wants. Will think about.  PAP - not had for "some time" Colon - not discussed today.

## 2011-11-01 NOTE — Assessment & Plan Note (Signed)
See A/P for DM.

## 2011-11-01 NOTE — Assessment & Plan Note (Addendum)
She does not have a meter bc she states she cannot afford it. She has no hypoglycemic episodes. Her A1C today is 13.3. Her only med is glucotrol. She is intolerant of metformin "makes her sick". I increased her glipizide to max. She will need insulin but with her grief over her sisters' deaths, the fact that this is the first time I am meeting her, and the coming holidays make this a less than ideal timing to make a sig lifestyle change. I will see her back early next year to discuss insulin. Will send visit to Christene Slates so we can coordinate appts.   Her microalb is elevated but she is on an ACEI.  She had been prescribed a statin but doesn't know why she isn't taking it. Her LDL came back elevated at 152. I will call her and let her know to restart a statin. Prava 20 mg will get about a 33% LDL reduction which should get her LDL < 100.   I wanted to refer her to optho but there was an issue with her medicaid card and she would have had to pay out of pocket. Will wait until her card gets straightened out.  She has agreed to change from regular soda to diet. She doesn't think she can give up on soda entirely.

## 2011-11-01 NOTE — Assessment & Plan Note (Signed)
BP Readings from Last 3 Encounters:  10/29/11 144/84  12/19/10 149/92  06/26/10 153/86    BP remains elevated. I am trying to tackle one thing at a time and not overwhelm her. She does not recall ever being on a diuretic. I will add HCTZ at next visit.

## 2011-11-13 ENCOUNTER — Telehealth: Payer: Self-pay | Admitting: Internal Medicine

## 2011-11-13 NOTE — Telephone Encounter (Signed)
Pt was called about Pravastatin 13m; stated she has already started taking this medication; her pharmacy had called her also.

## 2011-11-13 NOTE — Telephone Encounter (Signed)
Left message for pt to call. Just need to tell her that a statin (the one she had before) has been sent to pharmacy for her since her LDL is too high.

## 2012-02-18 ENCOUNTER — Ambulatory Visit (INDEPENDENT_AMBULATORY_CARE_PROVIDER_SITE_OTHER): Payer: Medicare Other | Admitting: Internal Medicine

## 2012-02-18 ENCOUNTER — Encounter: Payer: Self-pay | Admitting: Internal Medicine

## 2012-02-18 VITALS — BP 139/84 | HR 106 | Temp 97.8°F | Ht 65.0 in | Wt 130.6 lb

## 2012-02-18 DIAGNOSIS — Z0000 Encounter for general adult medical examination without abnormal findings: Secondary | ICD-10-CM

## 2012-02-18 DIAGNOSIS — R718 Other abnormality of red blood cells: Secondary | ICD-10-CM

## 2012-02-18 DIAGNOSIS — E119 Type 2 diabetes mellitus without complications: Secondary | ICD-10-CM

## 2012-02-18 DIAGNOSIS — F411 Generalized anxiety disorder: Secondary | ICD-10-CM

## 2012-02-18 DIAGNOSIS — E785 Hyperlipidemia, unspecified: Secondary | ICD-10-CM

## 2012-02-18 DIAGNOSIS — I10 Essential (primary) hypertension: Secondary | ICD-10-CM

## 2012-02-18 DIAGNOSIS — Z1231 Encounter for screening mammogram for malignant neoplasm of breast: Secondary | ICD-10-CM

## 2012-02-18 DIAGNOSIS — Z79899 Other long term (current) drug therapy: Secondary | ICD-10-CM

## 2012-02-18 DIAGNOSIS — J301 Allergic rhinitis due to pollen: Secondary | ICD-10-CM

## 2012-02-18 DIAGNOSIS — R5383 Other fatigue: Secondary | ICD-10-CM

## 2012-02-18 DIAGNOSIS — G8929 Other chronic pain: Secondary | ICD-10-CM

## 2012-02-18 LAB — GLUCOSE, CAPILLARY: Glucose-Capillary: 272 mg/dL — ABNORMAL HIGH (ref 70–99)

## 2012-02-18 MED ORDER — ALPRAZOLAM 1 MG PO TABS: 1.0000 mg | ORAL_TABLET | Freq: Two times a day (BID) | ORAL | Status: DC | PRN

## 2012-02-18 MED ORDER — LISINOPRIL 10 MG PO TABS: 10.0000 mg | ORAL_TABLET | Freq: Every day | ORAL | Status: DC

## 2012-02-18 MED ORDER — ACETAMINOPHEN 500 MG PO CAPS: 500.0000 mg | ORAL_CAPSULE | ORAL | Status: DC | PRN

## 2012-02-18 MED ORDER — GLIPIZIDE ER 10 MG PO TB24: 20.0000 mg | ORAL_TABLET | Freq: Every day | ORAL | Status: DC

## 2012-02-18 MED ORDER — FREESTYLE SYSTEM KIT: 1.0000 | PACK | Status: AC | PRN

## 2012-02-18 MED ORDER — PRAVASTATIN SODIUM 20 MG PO TABS: 20.0000 mg | ORAL_TABLET | Freq: Every day | ORAL | Status: DC

## 2012-02-18 NOTE — Assessment & Plan Note (Signed)
She is taking the statin. No side effects. Will check lipids.

## 2012-02-18 NOTE — Assessment & Plan Note (Signed)
She c/o fatigue and alternating hot and cold. Will do some basic labs. She requested vitamins but will wait for labs.

## 2012-02-18 NOTE — Assessment & Plan Note (Signed)
Uses the meds in the spring / summer only.

## 2012-02-18 NOTE — Assessment & Plan Note (Addendum)
MMG ordered today but wants to wait a few months to get her teeth straightened out.   Optho referral - again wants to wait.   Has other HM needs but will not overwhelm her.  Seeing oral surgeon tomorrow to extract two teeth. Completed ABX. Also on NSAID.

## 2012-02-18 NOTE — Assessment & Plan Note (Signed)
Moderate control on ACEI only. Follow.

## 2012-02-18 NOTE — Assessment & Plan Note (Signed)
C/O OA of the knees occasionally. Hasn't tried any OTC. Will start with tylenol. Could try NSAID as Creatinine OK.

## 2012-02-18 NOTE — Progress Notes (Signed)
  Subjective:    Patient ID: Kristen Lozano, female    DOB: November 28, 1955, 56 y.o.   MRN: [id number]  HPI  Please see the A&P for the status of the pt's chronic medical problems.   Review of Systems  Constitutional: Positive for fatigue. Negative for appetite change and unexpected weight change.  HENT: Positive for facial swelling and dental problem.   Psychiatric/Behavioral: Positive for sleep disturbance.       Objective:   Physical Exam  Constitutional: She is oriented to person, place, and time. She appears well-developed and well-nourished. No distress.  HENT:  Head: Normocephalic and atraumatic.  Nose: Nose normal.  Neurological: She is alert and oriented to person, place, and time.  Skin: Skin is warm and dry. She is not diaphoretic.  Psychiatric: She has a normal mood and affect. Her behavior is normal. Judgment and thought content normal.          Assessment & Plan:

## 2012-02-18 NOTE — Assessment & Plan Note (Signed)
States is taking the Xanax once in the AM to help her relax for a nap and at night to help her sleep. I would like to eventually stop the AM dose as it probably isn't that important to ensure an AM nap and just continue at night. However, she is focused on her teeth and now is not the best time to change this. I refilled for 60 and 5 refills. Will reassess next visit. No red flags.

## 2012-02-18 NOTE — Assessment & Plan Note (Addendum)
She doesn't have a meter. I gave her a Rx and Christene Slates will show her how to use. Butch Penny has previously worked with her without much success. Her last A1C Dec was 13.3 and I increased her glucotrol to max bc she is intolerant of  Metformin and has been resistant to Insulin. Christene Slates suggested a referral to [facility] and [place] which I will do. She likely will still need insulin but I do not have A1C today and her focus is on her tooth abscess so again this is not the best time. Hopefully, next visit there will not be an acute issue and I can really talk to her about insulin. When Butch Penny talked to her today, she shut her down immediately regarding insulin.   On Ace I.  Referred to optho.  Check lipids today. She is taking the statin - I saw in her med bag.

## 2012-02-19 ENCOUNTER — Other Ambulatory Visit: Payer: Self-pay | Admitting: Internal Medicine

## 2012-02-19 ENCOUNTER — Encounter: Payer: Self-pay | Admitting: Internal Medicine

## 2012-02-19 LAB — LIPID PANEL
Cholesterol: 209 mg/dL — ABNORMAL HIGH (ref 0–200)
HDL: 50 mg/dL (ref >39)
LDL Cholesterol: 120 mg/dL — ABNORMAL HIGH (ref 0–99)
Total CHOL/HDL Ratio: 4.2 Ratio
VLDL: 39 mg/dL (ref 0–40)

## 2012-02-19 LAB — COMPLETE METABOLIC PANEL WITH GFR
ALT: 14 U/L (ref 0–35)
AST: 16 U/L (ref 0–37)
Albumin: 4.6 g/dL (ref 3.5–5.2)
Alkaline Phosphatase: 76 U/L (ref 39–117)
Calcium: 9.8 mg/dL (ref 8.4–10.5)
Chloride: 97 mEq/L (ref 96–112)
Creat: 0.67 mg/dL (ref 0.50–1.10)
GFR, Est African American: >89 mL/min
Glucose, Bld: 241 mg/dL — ABNORMAL HIGH (ref 70–99)
Potassium: 4.1 mEq/L (ref 3.5–5.3)
Sodium: 136 mEq/L (ref 135–145)
Total Protein: 7.9 g/dL (ref 6.0–8.3)

## 2012-02-19 MED ORDER — PRAVASTATIN SODIUM 40 MG PO TABS: 40.0000 mg | ORAL_TABLET | Freq: Every day | ORAL | Status: DC

## 2012-02-19 NOTE — Telephone Encounter (Signed)
I saw her yesterday and apparently the Rx was set to print and I never gave her the Rx. Would you please call it in. #[phone]. No red flags. Thanks

## 2012-02-27 ENCOUNTER — Telehealth: Payer: Self-pay | Admitting: Licensed Clinical Social Worker

## 2012-02-27 NOTE — Telephone Encounter (Signed)
CSW called pt to discuss referral to [facility].  Pt in agreement for referral. Pt has M/C but also M/A, will fax referral to [facility].

## 2012-03-18 ENCOUNTER — Telehealth: Payer: Self-pay | Admitting: Licensed Clinical Social Worker

## 2012-03-18 NOTE — Progress Notes (Signed)
Addended by: Hulan Fray on: 03/18/2012 03:26 PM   Modules accepted: Orders

## 2012-03-18 NOTE — Telephone Encounter (Signed)
Thank you for talking with the pt and the Home health agency.

## 2012-03-18 NOTE — Telephone Encounter (Signed)
Pt's friend, Kristen Lozano - [employer] [place], came to [facility] to pick up PCS form.  CSW and friend, telephoned Kristen Lozano regarding PCS services.  Discussed with pt and friend, per Dr. Lynnae January, Kristen Lozano is independent with ADL's.  Pt states she does have knee pain, but can put on her clothes and bathe herself.  Friend states that Kristen Lozano is a fall risk due to her arthritis and needs help around the house.  CSW informed Kristen Lozano her concerns will be forwarded to the PCP.  CSW informed pt her ADL's will be evaluated during medical appointments and pt can always discuss with PCP if changes in her abilities to perform her ADL's change.  Pt is aware that a referral to [facility] has been placed.

## 2012-03-23 ENCOUNTER — Ambulatory Visit (HOSPITAL_COMMUNITY)
Admission: RE | Admit: 2012-03-23 | Discharge: 2012-03-23 | Disposition: A | Discharge status: Home / Self Care | Payer: Medicare Other | Source: Ambulatory Visit | Attending: Internal Medicine | Admitting: Internal Medicine

## 2012-03-23 DIAGNOSIS — Z1231 Encounter for screening mammogram for malignant neoplasm of breast: Secondary | ICD-10-CM | POA: Insufficient documentation

## 2012-05-19 ENCOUNTER — Ambulatory Visit: Payer: Medicare Other | Admitting: Internal Medicine

## 2012-08-10 ENCOUNTER — Encounter: Payer: Self-pay | Admitting: Internal Medicine

## 2012-08-10 DIAGNOSIS — Z9114 Patient's other noncompliance with medication regimen: Secondary | ICD-10-CM | POA: Insufficient documentation

## 2012-08-11 ENCOUNTER — Ambulatory Visit: Payer: Medicare Other | Admitting: Internal Medicine

## 2012-08-11 ENCOUNTER — Encounter: Payer: Medicare Other | Admitting: Dietician

## 2012-12-02 ENCOUNTER — Other Ambulatory Visit: Payer: Self-pay | Admitting: Internal Medicine

## 2012-12-02 MED ORDER — HYDRALAZINE HCL 50 MG PO TABS: ORAL_TABLET | ORAL | Status: DC

## 2013-05-04 ENCOUNTER — Encounter: Payer: Self-pay | Admitting: Dietician

## 2013-06-03 ENCOUNTER — Other Ambulatory Visit: Payer: Self-pay

## 2013-10-14 ENCOUNTER — Telehealth: Payer: Self-pay | Admitting: Dietician

## 2013-10-14 NOTE — Telephone Encounter (Signed)
Patient's sister said she is out of town.

## 2013-10-15 ENCOUNTER — Encounter: Payer: Self-pay | Admitting: Internal Medicine

## 2014-04-01 ENCOUNTER — Emergency Department (HOSPITAL_COMMUNITY): Payer: No Typology Code available for payment source

## 2014-04-01 ENCOUNTER — Encounter (HOSPITAL_COMMUNITY): Payer: Self-pay | Admitting: Emergency Medicine

## 2014-04-01 ENCOUNTER — Emergency Department (HOSPITAL_COMMUNITY)
Admission: EM | Admit: 2014-04-01 | Discharge: 2014-04-02 | Disposition: A | Discharge status: Home / Self Care | Payer: No Typology Code available for payment source | Attending: Emergency Medicine | Admitting: Emergency Medicine

## 2014-04-01 DIAGNOSIS — S199XXA Unspecified injury of neck, initial encounter: Principal | ICD-10-CM

## 2014-04-01 DIAGNOSIS — R000 Tachycardia, unspecified: Secondary | ICD-10-CM | POA: Insufficient documentation

## 2014-04-01 DIAGNOSIS — Y92410 Unspecified street and highway as the place of occurrence of the external cause: Secondary | ICD-10-CM | POA: Insufficient documentation

## 2014-04-01 DIAGNOSIS — F3289 Other specified depressive episodes: Secondary | ICD-10-CM | POA: Insufficient documentation

## 2014-04-01 DIAGNOSIS — N281 Cyst of kidney, acquired: Secondary | ICD-10-CM | POA: Insufficient documentation

## 2014-04-01 DIAGNOSIS — F411 Generalized anxiety disorder: Secondary | ICD-10-CM | POA: Insufficient documentation

## 2014-04-01 DIAGNOSIS — G8929 Other chronic pain: Secondary | ICD-10-CM | POA: Insufficient documentation

## 2014-04-01 DIAGNOSIS — M129 Arthropathy, unspecified: Secondary | ICD-10-CM | POA: Insufficient documentation

## 2014-04-01 DIAGNOSIS — Z862 Personal history of diseases of the blood and blood-forming organs and certain disorders involving the immune mechanism: Secondary | ICD-10-CM | POA: Insufficient documentation

## 2014-04-01 DIAGNOSIS — S0993XA Unspecified injury of face, initial encounter: Secondary | ICD-10-CM | POA: Insufficient documentation

## 2014-04-01 DIAGNOSIS — I10 Essential (primary) hypertension: Secondary | ICD-10-CM | POA: Insufficient documentation

## 2014-04-01 DIAGNOSIS — S3981XA Other specified injuries of abdomen, initial encounter: Secondary | ICD-10-CM | POA: Insufficient documentation

## 2014-04-01 DIAGNOSIS — Z791 Long term (current) use of non-steroidal anti-inflammatories (NSAID): Secondary | ICD-10-CM | POA: Insufficient documentation

## 2014-04-01 DIAGNOSIS — E119 Type 2 diabetes mellitus without complications: Secondary | ICD-10-CM | POA: Insufficient documentation

## 2014-04-01 DIAGNOSIS — F329 Major depressive disorder, single episode, unspecified: Secondary | ICD-10-CM | POA: Insufficient documentation

## 2014-04-01 DIAGNOSIS — Y9389 Activity, other specified: Secondary | ICD-10-CM | POA: Insufficient documentation

## 2014-04-01 DIAGNOSIS — Z79899 Other long term (current) drug therapy: Secondary | ICD-10-CM | POA: Insufficient documentation

## 2014-04-01 HISTORY — DX: Unspecified osteoarthritis, unspecified site: M19.90

## 2014-04-01 LAB — URINALYSIS, ROUTINE W REFLEX MICROSCOPIC
Bilirubin Urine: NEGATIVE
Glucose, UA: >1000 mg/dL — AB
Hgb urine dipstick: NEGATIVE
Ketones, ur: 15 mg/dL — AB
LEUKOCYTES UA: NEGATIVE
NITRITE: NEGATIVE
Protein, ur: NEGATIVE mg/dL
SPECIFIC GRAVITY, URINE: 1.038 — AB (ref 1.005–1.030)
UROBILINOGEN UA: 0.2 mg/dL (ref 0.0–1.0)
pH: 5.5 (ref 5.0–8.0)

## 2014-04-01 LAB — I-STAT CHEM 8, ED
BUN: 7 mg/dL (ref 6–23)
CHLORIDE: 94 meq/L — AB (ref 96–112)
Calcium, Ion: 1.22 mmol/L (ref 1.12–1.23)
Creatinine, Ser: 0.5 mg/dL (ref 0.50–1.10)
GLUCOSE: 526 mg/dL — AB (ref 70–99)
HEMATOCRIT: 48 % — AB (ref 36.0–46.0)
HEMOGLOBIN: 16.3 g/dL — AB (ref 12.0–15.0)
POTASSIUM: 3.9 meq/L (ref 3.7–5.3)
SODIUM: 135 meq/L — AB (ref 137–147)
TCO2: 24 mmol/L (ref 0–100)

## 2014-04-01 LAB — URINE MICROSCOPIC-ADD ON

## 2014-04-01 LAB — CBG MONITORING, ED
GLUCOSE-CAPILLARY: 320 mg/dL — AB (ref 70–99)
Glucose-Capillary: 403 mg/dL — ABNORMAL HIGH (ref 70–99)

## 2014-04-01 MED ORDER — MORPHINE SULFATE 4 MG/ML IJ SOLN
4.0000 mg | Freq: Once | INTRAMUSCULAR | Status: AC
  Administered 2014-04-01: 4 mg via INTRAVENOUS
  Filled 2014-04-01: qty 1

## 2014-04-01 MED ORDER — SODIUM CHLORIDE 0.9 % IV BOLUS (SEPSIS)
1000.0000 mL | Freq: Once | INTRAVENOUS | Status: AC
  Administered 2014-04-01: 1000 mL via INTRAVENOUS

## 2014-04-01 MED ORDER — IOHEXOL 300 MG/ML  SOLN
100.0000 mL | Freq: Once | INTRAMUSCULAR | Status: AC | PRN
  Administered 2014-04-01: 100 mL via INTRAVENOUS

## 2014-04-01 MED ORDER — INSULIN ASPART 100 UNIT/ML ~~LOC~~ SOLN
12.0000 [IU] | Freq: Once | SUBCUTANEOUS | Status: AC
  Administered 2014-04-01: 12 [IU] via SUBCUTANEOUS
  Filled 2014-04-01: qty 1

## 2014-04-01 NOTE — ED Notes (Signed)
Patient transported to CT 

## 2014-04-01 NOTE — ED Notes (Signed)
Pt visitor came to nurse's station asking to speak with the nurse. Also requesting pain medication and something to lower her blood sugar.
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
## 2014-04-01 NOTE — ED Notes (Signed)
Portable xray at bedside.
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
## 2014-05-03 ENCOUNTER — Encounter: Payer: Self-pay | Admitting: Physical Medicine & Rehabilitation

## 2014-05-21 ENCOUNTER — Encounter (HOSPITAL_COMMUNITY): Payer: Self-pay | Admitting: Emergency Medicine

## 2014-05-21 ENCOUNTER — Emergency Department (HOSPITAL_COMMUNITY)
Admission: EM | Admit: 2014-05-21 | Discharge: 2014-05-21 | Disposition: A | Discharge status: Home / Self Care | Payer: Medicare Other | Attending: Emergency Medicine | Admitting: Emergency Medicine

## 2014-05-21 DIAGNOSIS — M79606 Pain in leg, unspecified: Secondary | ICD-10-CM

## 2014-05-21 DIAGNOSIS — R739 Hyperglycemia, unspecified: Secondary | ICD-10-CM

## 2014-05-21 DIAGNOSIS — Z791 Long term (current) use of non-steroidal anti-inflammatories (NSAID): Secondary | ICD-10-CM | POA: Insufficient documentation

## 2014-05-21 DIAGNOSIS — E119 Type 2 diabetes mellitus without complications: Secondary | ICD-10-CM | POA: Insufficient documentation

## 2014-05-21 DIAGNOSIS — F3289 Other specified depressive episodes: Secondary | ICD-10-CM | POA: Insufficient documentation

## 2014-05-21 DIAGNOSIS — D573 Sickle-cell trait: Secondary | ICD-10-CM | POA: Insufficient documentation

## 2014-05-21 DIAGNOSIS — R000 Tachycardia, unspecified: Secondary | ICD-10-CM

## 2014-05-21 DIAGNOSIS — IMO0001 Reserved for inherently not codable concepts without codable children: Secondary | ICD-10-CM | POA: Diagnosis present

## 2014-05-21 DIAGNOSIS — M79609 Pain in unspecified limb: Secondary | ICD-10-CM | POA: Diagnosis not present

## 2014-05-21 DIAGNOSIS — I498 Other specified cardiac arrhythmias: Secondary | ICD-10-CM | POA: Diagnosis not present

## 2014-05-21 DIAGNOSIS — F329 Major depressive disorder, single episode, unspecified: Secondary | ICD-10-CM | POA: Diagnosis not present

## 2014-05-21 DIAGNOSIS — G8929 Other chronic pain: Secondary | ICD-10-CM | POA: Diagnosis not present

## 2014-05-21 DIAGNOSIS — Z79899 Other long term (current) drug therapy: Secondary | ICD-10-CM | POA: Insufficient documentation

## 2014-05-21 DIAGNOSIS — I10 Essential (primary) hypertension: Secondary | ICD-10-CM | POA: Diagnosis not present

## 2014-05-21 DIAGNOSIS — M129 Arthropathy, unspecified: Secondary | ICD-10-CM | POA: Insufficient documentation

## 2014-05-21 DIAGNOSIS — F411 Generalized anxiety disorder: Secondary | ICD-10-CM | POA: Insufficient documentation

## 2014-05-21 LAB — COMPREHENSIVE METABOLIC PANEL
ALK PHOS: 61 U/L (ref 39–117)
ALT: 12 U/L (ref 0–35)
AST: 18 U/L (ref 0–37)
Albumin: 4.1 g/dL (ref 3.5–5.2)
BUN: 17 mg/dL (ref 6–23)
CO2: 24 meq/L (ref 19–32)
Calcium: 10.1 mg/dL (ref 8.4–10.5)
Chloride: 98 mEq/L (ref 96–112)
Creatinine, Ser: 0.5 mg/dL (ref 0.50–1.10)
GFR calc Af Amer: >90 mL/min (ref >90)
GFR calc non Af Amer: >90 mL/min (ref >90)
Glucose, Bld: 203 mg/dL — ABNORMAL HIGH (ref 70–99)
Potassium: 4.4 mEq/L (ref 3.7–5.3)
SODIUM: 138 meq/L (ref 137–147)
Total Bilirubin: 0.5 mg/dL (ref 0.3–1.2)
Total Protein: 8.9 g/dL — ABNORMAL HIGH (ref 6.0–8.3)

## 2014-05-21 LAB — CBC
HEMATOCRIT: 44 % (ref 36.0–46.0)
HEMOGLOBIN: 14.6 g/dL (ref 12.0–15.0)
MCH: 22.6 pg — AB (ref 26.0–34.0)
MCHC: 33.2 g/dL (ref 30.0–36.0)
MCV: 68.1 fL — ABNORMAL LOW (ref 78.0–100.0)
Platelets: 372 10*3/uL (ref 150–400)
RBC: 6.46 MIL/uL — AB (ref 3.87–5.11)
RDW: 14.9 % (ref 11.5–15.5)
WBC: 6.3 10*3/uL (ref 4.0–10.5)

## 2014-05-21 LAB — CBG MONITORING, ED: Glucose-Capillary: 194 mg/dL — ABNORMAL HIGH (ref 70–99)

## 2014-05-21 MED ORDER — SODIUM CHLORIDE 0.9 % IV BOLUS (SEPSIS)
1000.0000 mL | Freq: Once | INTRAVENOUS | Status: AC
  Administered 2014-05-21: 1000 mL via INTRAVENOUS

## 2014-05-21 NOTE — Discharge Instructions (Signed)
Take your Insulin as prescribed.   °

## 2014-05-21 NOTE — ED Notes (Signed)
Pt states she has "a aching pain" in bilateral thighs since missing 4 days of insulin.  Pt states pain is relieved with heat pad.
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
## 2014-05-21 NOTE — ED Notes (Signed)
Pt d/c to home with family

## 2014-05-23 NOTE — ED Provider Notes (Signed)
Medical screening examination/treatment/procedure(s) were performed by non-physician practitioner and as supervising physician I was immediately available for consultation/collaboration.   EKG Interpretation   Date/Time:  Saturday May 21 2014 12:57:25 EDT Ventricular Rate:  116 PR Interval:  146 QRS Duration: 82 QT Interval:  357 QTC Calculation: 496 R Axis:   88 Text Interpretation:  Sinus tachycardia Biatrial enlargement Borderline  prolonged QT interval Baseline wander in lead(s) V6 No significant change  since last tracing Confirmed by WARD,  DO, KRISTEN [phone]) on 05/21/2014  1:03:05 PM        Morrison, DO 05/23/14 2318

## 2014-06-20 ENCOUNTER — Encounter: Payer: Medicare Other | Attending: Physical Medicine & Rehabilitation

## 2014-06-20 ENCOUNTER — Ambulatory Visit: Payer: Self-pay | Admitting: Physical Medicine & Rehabilitation

## 2014-07-25 ENCOUNTER — Ambulatory Visit: Payer: Medicare Other | Admitting: Physical Medicine & Rehabilitation

## 2014-07-25 ENCOUNTER — Encounter: Payer: Medicare Other | Attending: Physical Medicine & Rehabilitation

## 2014-08-08 ENCOUNTER — Encounter: Payer: Self-pay | Admitting: Gastroenterology

## 2014-08-10 ENCOUNTER — Emergency Department (HOSPITAL_COMMUNITY)
Admission: EM | Admit: 2014-08-10 | Discharge: 2014-08-11 | Disposition: A | Discharge status: Home / Self Care | Payer: Medicare Other | Attending: Emergency Medicine | Admitting: Emergency Medicine

## 2014-08-10 ENCOUNTER — Ambulatory Visit: Payer: Medicare Other | Admitting: Gastroenterology

## 2014-08-10 ENCOUNTER — Encounter (HOSPITAL_COMMUNITY): Payer: Self-pay | Admitting: Emergency Medicine

## 2014-08-10 DIAGNOSIS — R112 Nausea with vomiting, unspecified: Secondary | ICD-10-CM

## 2014-08-10 DIAGNOSIS — E119 Type 2 diabetes mellitus without complications: Secondary | ICD-10-CM | POA: Insufficient documentation

## 2014-08-10 DIAGNOSIS — F411 Generalized anxiety disorder: Secondary | ICD-10-CM | POA: Diagnosis not present

## 2014-08-10 DIAGNOSIS — Z79899 Other long term (current) drug therapy: Secondary | ICD-10-CM | POA: Insufficient documentation

## 2014-08-10 DIAGNOSIS — Z862 Personal history of diseases of the blood and blood-forming organs and certain disorders involving the immune mechanism: Secondary | ICD-10-CM | POA: Insufficient documentation

## 2014-08-10 DIAGNOSIS — G8929 Other chronic pain: Secondary | ICD-10-CM | POA: Diagnosis not present

## 2014-08-10 DIAGNOSIS — Z794 Long term (current) use of insulin: Secondary | ICD-10-CM | POA: Diagnosis not present

## 2014-08-10 DIAGNOSIS — F329 Major depressive disorder, single episode, unspecified: Secondary | ICD-10-CM | POA: Diagnosis not present

## 2014-08-10 DIAGNOSIS — I10 Essential (primary) hypertension: Secondary | ICD-10-CM | POA: Insufficient documentation

## 2014-08-10 DIAGNOSIS — F3289 Other specified depressive episodes: Secondary | ICD-10-CM | POA: Insufficient documentation

## 2014-08-10 DIAGNOSIS — N390 Urinary tract infection, site not specified: Secondary | ICD-10-CM | POA: Insufficient documentation

## 2014-08-10 DIAGNOSIS — Z8709 Personal history of other diseases of the respiratory system: Secondary | ICD-10-CM | POA: Insufficient documentation

## 2014-08-10 DIAGNOSIS — M129 Arthropathy, unspecified: Secondary | ICD-10-CM | POA: Insufficient documentation

## 2014-08-10 DIAGNOSIS — R634 Abnormal weight loss: Secondary | ICD-10-CM | POA: Diagnosis not present

## 2014-08-10 LAB — COMPREHENSIVE METABOLIC PANEL
ALK PHOS: 58 U/L (ref 39–117)
ALT: 25 U/L (ref 0–35)
ANION GAP: 14 (ref 5–15)
AST: 39 U/L — ABNORMAL HIGH (ref 0–37)
Albumin: 3.7 g/dL (ref 3.5–5.2)
BILIRUBIN TOTAL: 0.7 mg/dL (ref 0.3–1.2)
BUN: 13 mg/dL (ref 6–23)
CO2: 26 meq/L (ref 19–32)
Calcium: 10.2 mg/dL (ref 8.4–10.5)
Chloride: 97 mEq/L (ref 96–112)
Creatinine, Ser: 0.6 mg/dL (ref 0.50–1.10)
GLUCOSE: 145 mg/dL — AB (ref 70–99)
POTASSIUM: 4.2 meq/L (ref 3.7–5.3)
Sodium: 137 mEq/L (ref 137–147)
TOTAL PROTEIN: 8.4 g/dL — AB (ref 6.0–8.3)

## 2014-08-10 LAB — CBC WITH DIFFERENTIAL/PLATELET
BASOS ABS: 0 10*3/uL (ref 0.0–0.1)
Basophils Relative: 0 % (ref 0–1)
Eosinophils Absolute: 0 10*3/uL (ref 0.0–0.7)
Eosinophils Relative: 0 % (ref 0–5)
HCT: 39.2 % (ref 36.0–46.0)
Hemoglobin: 14.1 g/dL (ref 12.0–15.0)
LYMPHS PCT: 20 % (ref 12–46)
Lymphs Abs: 2.3 10*3/uL (ref 0.7–4.0)
MCH: 23.5 pg — ABNORMAL LOW (ref 26.0–34.0)
MCHC: 36 g/dL (ref 30.0–36.0)
MCV: 65.2 fL — ABNORMAL LOW (ref 78.0–100.0)
Monocytes Absolute: 1.3 10*3/uL — ABNORMAL HIGH (ref 0.1–1.0)
Monocytes Relative: 11 % (ref 3–12)
Neutro Abs: 7.8 10*3/uL — ABNORMAL HIGH (ref 1.7–7.7)
Neutrophils Relative %: 69 % (ref 43–77)
PLATELETS: 333 10*3/uL (ref 150–400)
RBC: 6.01 MIL/uL — ABNORMAL HIGH (ref 3.87–5.11)
RDW: 14.5 % (ref 11.5–15.5)
WBC: 11.4 10*3/uL — ABNORMAL HIGH (ref 4.0–10.5)

## 2014-08-10 NOTE — ED Notes (Signed)
Pt. reports intermittent nausea with vomitting for several days , poor appetite and weight loss, denies fever or chills / no diarrhea.

## 2014-08-11 DIAGNOSIS — R112 Nausea with vomiting, unspecified: Secondary | ICD-10-CM | POA: Diagnosis not present

## 2014-08-11 LAB — URINALYSIS, ROUTINE W REFLEX MICROSCOPIC
Glucose, UA: NEGATIVE mg/dL
HGB URINE DIPSTICK: NEGATIVE
KETONES UR: 40 mg/dL — AB
NITRITE: NEGATIVE
PROTEIN: 30 mg/dL — AB
Specific Gravity, Urine: 1.02 (ref 1.005–1.030)
UROBILINOGEN UA: 1 mg/dL (ref 0.0–1.0)
pH: 5.5 (ref 5.0–8.0)

## 2014-08-11 LAB — URINE MICROSCOPIC-ADD ON

## 2014-08-11 MED ORDER — ONDANSETRON 4 MG PO TBDP
8.0000 mg | ORAL_TABLET | Freq: Once | ORAL | Status: AC
  Administered 2014-08-11: 8 mg via ORAL
  Filled 2014-08-11: qty 2

## 2014-08-11 MED ORDER — CIPROFLOXACIN HCL 500 MG PO TABS: 500.0000 mg | ORAL_TABLET | Freq: Two times a day (BID) | ORAL | Status: DC

## 2014-08-11 MED ORDER — METOCLOPRAMIDE HCL 10 MG PO TABS
10.0000 mg | ORAL_TABLET | Freq: Three times a day (TID) | ORAL | Status: DC
Start: 2014-08-11 — End: 2014-10-18

## 2014-08-11 MED ORDER — SODIUM CHLORIDE 0.9 % IV SOLN
1000.0000 mL | Freq: Once | INTRAVENOUS | Status: AC
  Administered 2014-08-11: 1000 mL via INTRAVENOUS

## 2014-08-11 MED ORDER — CIPROFLOXACIN HCL 500 MG PO TABS
500.0000 mg | ORAL_TABLET | Freq: Once | ORAL | Status: DC
  Filled 2014-08-11: qty 1

## 2014-08-11 MED ORDER — ONDANSETRON 8 MG PO TBDP: 8.0000 mg | ORAL_TABLET | Freq: Three times a day (TID) | ORAL | Status: DC | PRN

## 2014-08-11 MED ORDER — METOCLOPRAMIDE HCL 5 MG/ML IJ SOLN
10.0000 mg | Freq: Once | INTRAMUSCULAR | Status: AC
  Administered 2014-08-11: 10 mg via INTRAVENOUS
  Filled 2014-08-11: qty 2

## 2014-08-11 MED ORDER — SODIUM CHLORIDE 0.9 % IV SOLN: 1000.0000 mL | INTRAVENOUS | Status: DC

## 2014-08-11 NOTE — ED Notes (Signed)
Pt states that she would not like pain medication.

## 2014-08-11 NOTE — ED Notes (Signed)
Pt states that she is feeling better, so this RN gave the pt a ginger ale and some graham crackers as a challenge.

## 2014-08-11 NOTE — Discharge Instructions (Signed)
Nausea and Vomiting °Nausea is a sick feeling that often comes before throwing up (vomiting). Vomiting is a reflex where stomach contents come out of your mouth. Vomiting can cause severe loss of body fluids (dehydration). Children and elderly adults can become dehydrated quickly, especially if they also have diarrhea. Nausea and vomiting are symptoms of a condition or disease. It is important to find the cause of your symptoms. °CAUSES  °· Direct irritation of the stomach lining. This irritation can result from increased acid production (gastroesophageal reflux disease), infection, food poisoning, taking certain medicines (such as nonsteroidal anti-inflammatory drugs), alcohol use, or tobacco use. °· Signals from the brain. These signals could be caused by a headache, heat exposure, an inner ear disturbance, increased pressure in the brain from injury, infection, a tumor, or a concussion, pain, emotional stimulus, or metabolic problems. °· An obstruction in the gastrointestinal tract (bowel obstruction). °· Illnesses such as diabetes, hepatitis, gallbladder problems, appendicitis, kidney problems, cancer, sepsis, atypical symptoms of a heart attack, or eating disorders. °· Medical treatments such as chemotherapy and radiation. °· Receiving medicine that makes you sleep (general anesthetic) during surgery. °DIAGNOSIS °Your caregiver may ask for tests to be done if the problems do not improve after a few days. Tests may also be done if symptoms are severe or if the reason for the nausea and vomiting is not clear. Tests may include: °· Urine tests. °· Blood tests. °· Stool tests. °· Cultures (to look for evidence of infection). °· X-rays or other imaging studies. °Test results can help your caregiver make decisions about treatment or the need for additional tests. °TREATMENT °You need to stay well hydrated. Drink frequently but in small amounts. You may wish to drink water, sports drinks, clear broth, or eat frozen  ice pops or gelatin dessert to help stay hydrated. When you eat, eating slowly may help prevent nausea. There are also some antinausea medicines that may help prevent nausea. °HOME CARE INSTRUCTIONS  °· Take all medicine as directed by your caregiver. °· If you do not have an appetite, do not force yourself to eat. However, you must continue to drink fluids. °· If you have an appetite, eat a normal diet unless your caregiver tells you differently. °¨ Eat a variety of complex carbohydrates (rice, wheat, potatoes, bread), lean meats, yogurt, fruits, and vegetables. °¨ Avoid high-fat foods because they are more difficult to digest. °· Drink enough water and fluids to keep your urine clear or pale yellow. °· If you are dehydrated, ask your caregiver for specific rehydration instructions. Signs of dehydration may include: °¨ Severe thirst. °¨ Dry lips and mouth. °¨ Dizziness. °¨ Dark urine. °¨ Decreasing urine frequency and amount. °¨ Confusion. °¨ Rapid breathing or pulse. °SEEK IMMEDIATE MEDICAL CARE IF:  °· You have blood or brown flecks (like coffee grounds) in your vomit. °· You have black or bloody stools. °· You have a severe headache or stiff neck. °· You are confused. °· You have severe abdominal pain. °· You have chest pain or trouble breathing. °· You do not urinate at least once every 8 hours. °· You develop cold or clammy skin. °· You continue to vomit for longer than 24 to 48 hours. °· You have a fever. °MAKE SURE YOU:  °· Understand these instructions. °· Will watch your condition. °· Will get help right away if you are not doing well or get worse. °Document Released: 11/11/2005 Document Revised: 02/03/2012 Document Reviewed: 04/10/2011 °[employer]® Patient Information ©2015 [employer]. This information is not intended   to replace advice given to you by your health care provider. Make sure you discuss any questions you have with your health care provider.  Urinary Tract Infection Urinary tract  infections (UTIs) can develop anywhere along your urinary tract. Your urinary tract is your body's drainage system for removing wastes and extra water. Your urinary tract includes two kidneys, two ureters, a bladder, and a urethra. Your kidneys are a pair of bean-shaped organs. Each kidney is about the size of your fist. They are located below your ribs, one on each side of your spine. CAUSES Infections are caused by microbes, which are microscopic organisms, including fungi, viruses, and bacteria. These organisms are so small that they can only be seen through a microscope. Bacteria are the microbes that most commonly cause UTIs. SYMPTOMS  Symptoms of UTIs may vary by age and gender of the patient and by the location of the infection. Symptoms in young women typically include a frequent and intense urge to urinate and a painful, burning feeling in the bladder or urethra during urination. Older women and men are more likely to be tired, shaky, and weak and have muscle aches and abdominal pain. A fever may mean the infection is in your kidneys. Other symptoms of a kidney infection include pain in your back or sides below the ribs, nausea, and vomiting. DIAGNOSIS To diagnose a UTI, your caregiver will ask you about your symptoms. Your caregiver also will ask to provide a urine sample. The urine sample will be tested for bacteria and white blood cells. White blood cells are made by your body to help fight infection. TREATMENT  Typically, UTIs can be treated with medication. Because most UTIs are caused by a bacterial infection, they usually can be treated with the use of antibiotics. The choice of antibiotic and length of treatment depend on your symptoms and the type of bacteria causing your infection. HOME CARE INSTRUCTIONS  If you were prescribed antibiotics, take them exactly as your caregiver instructs you. Finish the medication even if you feel better after you have only taken some of the  medication.  Drink enough water and fluids to keep your urine clear or pale yellow.  Avoid caffeine, tea, and carbonated beverages. They tend to irritate your bladder.  Empty your bladder often. Avoid holding urine for long periods of time.  Empty your bladder before and after sexual intercourse.  After a bowel movement, women should cleanse from front to back. Use each tissue only once. SEEK MEDICAL CARE IF:   You have back pain.  You develop a fever.  Your symptoms do not begin to resolve within 3 days. SEEK IMMEDIATE MEDICAL CARE IF:   You have severe back pain or lower abdominal pain.  You develop chills.  You have nausea or vomiting.  You have continued burning or discomfort with urination. MAKE SURE YOU:   Understand these instructions.  Will watch your condition.  Will get help right away if you are not doing well or get worse. Document Released: 08/21/2005 Document Revised: 05/12/2012 Document Reviewed: 12/20/2011 [facility] Patient Information 2015 [place], [place]. This information is not intended to replace advice given to you by your health care provider. Make sure you discuss any questions you have with your health care provider.

## 2014-08-11 NOTE — ED Notes (Addendum)
Pt tolerating ginger ale and crackers. Pt denies nausea at this time. Sharol Given, MD notified of the pt's progress.

## 2014-08-11 NOTE — ED Notes (Signed)
Kristen Fila- [phone]

## 2014-08-11 NOTE — ED Notes (Signed)
This RN unable to gain IV access.  

## 2014-08-11 NOTE — ED Provider Notes (Signed)
CSN: [id number]     Arrival date & time 08/10/14  1900 History   First MD Initiated Contact with Patient 08/10/14 2355     Chief Complaint  Patient presents with  . Emesis  . Weight Loss     (Consider location/radiation/quality/duration/timing/severity/associated sxs/prior Treatment) HPI 58 yo female presents to the ER from home with complaint of 2-3 weeks of vomiting after each meal, weight loss.  No fever, chills, diarrhea.  No change in meds.  Pt reports she was seen yesterday by her PCM at the [facility], and had blood work done.  She is supposed to f/u in December.  Pt reports every time she eats something she gets upper abdominal pain and vomits until her stomach is empty again.  She has had diabetes for 2 years.  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   Past Medical History  Diagnosis Date  . Anxiety     Has been on Xanax since 2009. Uses it for stress, anxiety, and insomnia. No contract yet.  . Depression   . Sickle cell trait   . Diabetes mellitus     Type 2, non insulin dependent. Was dx'd prior to 2008.  Marland Kitchen Hypertension   . Chronic pain     Has OA of knees B. No Xrays in echart. Not requiring narcotics.  . Allergic rhinitis     Requires cetirizine, singulair, and fluticasone.  . Arthritis    History reviewed. No pertinent past surgical history. Family History  Problem Relation Age of Onset  . Sickle cell anemia Daughter   . Schizophrenia Daughter   . Diabetes Mother   . Heart disease Father   . Cancer Sister   . Cancer Sister    History  Substance Use Topics  . Smoking status: Never Smoker   . Smokeless tobacco: Not on file  . Alcohol Use: No   OB History   Grav Para Term Preterm Abortions TAB SAB Ect Mult Living                 Review of Systems  All other systems reviewed and are negative.     Allergies  Review of patient's allergies indicates no known allergies.  Home Medications   Prior to Admission medications    Medication Sig Start Date End Date Taking? Authorizing Provider  lisinopril (PRINIVIL,ZESTRIL) 10 MG tablet Take 10 mg by mouth daily.   Yes Historical Provider, MD  alprazolam Duanne Moron) 2 MG tablet Take 2 mg by mouth 3 (three) times daily as needed for anxiety.    Historical Provider, MD  insulin aspart protamine- aspart (NOVOLOG MIX 70/30) (70-30) 100 UNIT/ML injection Inject into the skin every morning.     Historical Provider, MD  Insulin Glargine (LANTUS SOLOSTAR) 100 UNIT/ML Solostar Pen Inject into the skin every evening.     Historical Provider, MD  meloxicam (MOBIC) 15 MG tablet Take 15 mg by mouth 2 (two) times daily as needed for pain.     Historical Provider, MD  metFORMIN (GLUCOPHAGE) 1000 MG tablet Take 1,000 mg by mouth 2 (two) times daily as needed (when insulin is not available).     Historical Provider, MD   BP 159/85  Pulse 60  Temp(Src) 98.4 F (36.9 C) (Oral)  Resp 11  Ht 5\' 4"  (1.626 m)  Wt 96 lb (43.545 kg)  BMI 16.47 kg/m2  SpO2 100% Physical Exam  Nursing note and vitals reviewed. Constitutional: She is oriented to person, place, and  time. She appears well-developed and well-nourished.  Pt appears underweight, tired  HENT:  Head: Normocephalic and atraumatic.  Nose: Nose normal.  Mouth/Throat: Oropharynx is clear and moist.  Eyes: Conjunctivae and EOM are normal. Pupils are equal, round, and reactive to light.  Neck: Normal range of motion. Neck supple. No JVD present. No tracheal deviation present. No thyromegaly present.  Cardiovascular: Normal rate, regular rhythm, normal heart sounds and intact distal pulses.  Exam reveals no gallop and no friction rub.   No murmur heard. Pulmonary/Chest: Effort normal and breath sounds normal. No stridor. No respiratory distress. She has no wheezes. She has no rales. She exhibits no tenderness.  Abdominal: Soft. Bowel sounds are normal. She exhibits no distension and no mass. There is no tenderness. There is no rebound and  no guarding.  Musculoskeletal: Normal range of motion. She exhibits no edema and no tenderness.  Lymphadenopathy:    She has no cervical adenopathy.  Neurological: She is alert and oriented to person, place, and time. She displays normal reflexes. She exhibits normal muscle tone. Coordination normal.  Skin: Skin is warm and dry. No rash noted. No erythema. No pallor.  Psychiatric: She has a normal mood and affect. Her behavior is normal. Judgment and thought content normal.    ED Course  Procedures (including critical care time) Labs Review Labs Reviewed  CBC WITH DIFFERENTIAL - Abnormal; Notable for the following:    WBC 11.4 (*)    RBC 6.01 (*)    MCV 65.2 (*)    MCH 23.5 (*)    Neutro Abs 7.8 (*)    Monocytes Absolute 1.3 (*)    All other components within normal limits  COMPREHENSIVE METABOLIC PANEL - Abnormal; Notable for the following:    Glucose, Bld 145 (*)    Total Protein 8.4 (*)    AST 39 (*)    All other components within normal limits  URINALYSIS, ROUTINE W REFLEX MICROSCOPIC - Abnormal; Notable for the following:    Color, Urine AMBER (*)    APPearance CLOUDY (*)    Bilirubin Urine SMALL (*)    Ketones, ur 40 (*)    Protein, ur 30 (*)    Leukocytes, UA SMALL (*)    All other components within normal limits  URINE MICROSCOPIC-ADD ON - Abnormal; Notable for the following:    Squamous Epithelial / LPF FEW (*)    Bacteria, UA MANY (*)    Casts HYALINE CASTS (*)    All other components within normal limits    Imaging Review No results found.   EKG Interpretation None      MDM   Final diagnoses:  Non-intractable vomiting with nausea, vomiting of unspecified type  UTI (lower urinary tract infection)    58 yo female with persistent vomiting after eating, concern for gastroparesis.  Will start on Reglan, refer to GI.      Kalman Drape, MD 08/11/14 [phone]

## 2014-09-04 ENCOUNTER — Emergency Department (HOSPITAL_COMMUNITY): Payer: Medicare Other

## 2014-09-04 ENCOUNTER — Encounter (HOSPITAL_COMMUNITY): Payer: Self-pay | Admitting: Emergency Medicine

## 2014-09-04 ENCOUNTER — Inpatient Hospital Stay (HOSPITAL_COMMUNITY)
Admission: EM | Admit: 2014-09-04 | Discharge: 2014-09-10 | DRG: 064 | Disposition: A | Discharge status: Home Health | Payer: Medicare Other | Attending: Internal Medicine | Admitting: Internal Medicine

## 2014-09-04 DIAGNOSIS — J309 Allergic rhinitis, unspecified: Secondary | ICD-10-CM | POA: Diagnosis present

## 2014-09-04 DIAGNOSIS — R29810 Facial weakness: Secondary | ICD-10-CM | POA: Diagnosis present

## 2014-09-04 DIAGNOSIS — F329 Major depressive disorder, single episode, unspecified: Secondary | ICD-10-CM | POA: Diagnosis present

## 2014-09-04 DIAGNOSIS — E119 Type 2 diabetes mellitus without complications: Secondary | ICD-10-CM | POA: Diagnosis present

## 2014-09-04 DIAGNOSIS — R000 Tachycardia, unspecified: Secondary | ICD-10-CM | POA: Diagnosis present

## 2014-09-04 DIAGNOSIS — Z794 Long term (current) use of insulin: Secondary | ICD-10-CM

## 2014-09-04 DIAGNOSIS — K3184 Gastroparesis: Secondary | ICD-10-CM | POA: Diagnosis present

## 2014-09-04 DIAGNOSIS — M1990 Unspecified osteoarthritis, unspecified site: Secondary | ICD-10-CM | POA: Diagnosis present

## 2014-09-04 DIAGNOSIS — I2510 Atherosclerotic heart disease of native coronary artery without angina pectoris: Secondary | ICD-10-CM | POA: Diagnosis present

## 2014-09-04 DIAGNOSIS — I639 Cerebral infarction, unspecified: Secondary | ICD-10-CM | POA: Diagnosis not present

## 2014-09-04 DIAGNOSIS — Z9114 Patient's other noncompliance with medication regimen: Secondary | ICD-10-CM

## 2014-09-04 DIAGNOSIS — I10 Essential (primary) hypertension: Secondary | ICD-10-CM | POA: Diagnosis present

## 2014-09-04 DIAGNOSIS — I509 Heart failure, unspecified: Secondary | ICD-10-CM | POA: Diagnosis present

## 2014-09-04 DIAGNOSIS — Z809 Family history of malignant neoplasm, unspecified: Secondary | ICD-10-CM

## 2014-09-04 DIAGNOSIS — I739 Peripheral vascular disease, unspecified: Secondary | ICD-10-CM | POA: Diagnosis present

## 2014-09-04 DIAGNOSIS — E1169 Type 2 diabetes mellitus with other specified complication: Secondary | ICD-10-CM

## 2014-09-04 DIAGNOSIS — E118 Type 2 diabetes mellitus with unspecified complications: Secondary | ICD-10-CM

## 2014-09-04 DIAGNOSIS — Z818 Family history of other mental and behavioral disorders: Secondary | ICD-10-CM

## 2014-09-04 DIAGNOSIS — Z833 Family history of diabetes mellitus: Secondary | ICD-10-CM

## 2014-09-04 DIAGNOSIS — G9341 Metabolic encephalopathy: Secondary | ICD-10-CM | POA: Diagnosis present

## 2014-09-04 DIAGNOSIS — K5900 Constipation, unspecified: Secondary | ICD-10-CM | POA: Diagnosis not present

## 2014-09-04 DIAGNOSIS — D573 Sickle-cell trait: Secondary | ICD-10-CM | POA: Diagnosis present

## 2014-09-04 DIAGNOSIS — F419 Anxiety disorder, unspecified: Secondary | ICD-10-CM | POA: Diagnosis present

## 2014-09-04 DIAGNOSIS — Z681 Body mass index (BMI) 19 or less, adult: Secondary | ICD-10-CM

## 2014-09-04 DIAGNOSIS — E46 Unspecified protein-calorie malnutrition: Secondary | ICD-10-CM | POA: Diagnosis present

## 2014-09-04 DIAGNOSIS — Z9229 Personal history of other drug therapy: Secondary | ICD-10-CM

## 2014-09-04 DIAGNOSIS — F411 Generalized anxiety disorder: Secondary | ICD-10-CM | POA: Diagnosis present

## 2014-09-04 DIAGNOSIS — Z8249 Family history of ischemic heart disease and other diseases of the circulatory system: Secondary | ICD-10-CM

## 2014-09-04 DIAGNOSIS — R634 Abnormal weight loss: Secondary | ICD-10-CM | POA: Diagnosis present

## 2014-09-04 DIAGNOSIS — I429 Cardiomyopathy, unspecified: Secondary | ICD-10-CM | POA: Diagnosis present

## 2014-09-04 DIAGNOSIS — I255 Ischemic cardiomyopathy: Secondary | ICD-10-CM

## 2014-09-04 DIAGNOSIS — G8929 Other chronic pain: Secondary | ICD-10-CM | POA: Diagnosis present

## 2014-09-04 DIAGNOSIS — F801 Expressive language disorder: Secondary | ICD-10-CM | POA: Diagnosis present

## 2014-09-04 DIAGNOSIS — R339 Retention of urine, unspecified: Secondary | ICD-10-CM | POA: Diagnosis not present

## 2014-09-04 DIAGNOSIS — R64 Cachexia: Secondary | ICD-10-CM | POA: Diagnosis present

## 2014-09-04 LAB — CBG MONITORING, ED: Glucose-Capillary: 135 mg/dL — ABNORMAL HIGH (ref 70–99)

## 2014-09-04 LAB — COMPREHENSIVE METABOLIC PANEL
ALT: 8 U/L (ref 0–35)
AST: 15 U/L (ref 0–37)
Albumin: 3.5 g/dL (ref 3.5–5.2)
Alkaline Phosphatase: 49 U/L (ref 39–117)
Anion gap: 16 — ABNORMAL HIGH (ref 5–15)
BUN: 12 mg/dL (ref 6–23)
CALCIUM: 10 mg/dL (ref 8.4–10.5)
CO2: 25 meq/L (ref 19–32)
Chloride: 98 mEq/L (ref 96–112)
Creatinine, Ser: 0.6 mg/dL (ref 0.50–1.10)
GFR calc non Af Amer: >90 mL/min (ref >90)
Glucose, Bld: 126 mg/dL — ABNORMAL HIGH (ref 70–99)
Potassium: 4 mEq/L (ref 3.7–5.3)
SODIUM: 139 meq/L (ref 137–147)
TOTAL PROTEIN: 8 g/dL (ref 6.0–8.3)
Total Bilirubin: 0.8 mg/dL (ref 0.3–1.2)

## 2014-09-04 LAB — AMMONIA: Ammonia: 20 umol/L (ref 11–60)

## 2014-09-04 LAB — I-STAT CG4 LACTIC ACID, ED: Lactic Acid, Venous: 1.53 mmol/L (ref 0.5–2.2)

## 2014-09-04 LAB — TSH: TSH: 3.52 u[IU]/mL (ref 0.350–4.500)

## 2014-09-04 LAB — ETHANOL

## 2014-09-04 LAB — CBC WITH DIFFERENTIAL/PLATELET
Basophils Absolute: 0.1 10*3/uL (ref 0.0–0.1)
Basophils Relative: 1 % (ref 0–1)
EOS ABS: 0.2 10*3/uL (ref 0.0–0.7)
EOS PCT: 2 % (ref 0–5)
HCT: 39.5 % (ref 36.0–46.0)
HEMOGLOBIN: 13 g/dL (ref 12.0–15.0)
Lymphocytes Relative: 31 % (ref 12–46)
Lymphs Abs: 2.4 10*3/uL (ref 0.7–4.0)
MCH: 22 pg — ABNORMAL LOW (ref 26.0–34.0)
MCHC: 32.9 g/dL (ref 30.0–36.0)
MCV: 66.7 fL — ABNORMAL LOW (ref 78.0–100.0)
Monocytes Absolute: 0.6 10*3/uL (ref 0.1–1.0)
Monocytes Relative: 8 % (ref 3–12)
NEUTROS PCT: 58 % (ref 43–77)
Neutro Abs: 4.3 10*3/uL (ref 1.7–7.7)
PLATELETS: 350 10*3/uL (ref 150–400)
RBC: 5.92 MIL/uL — AB (ref 3.87–5.11)
RDW: 14.9 % (ref 11.5–15.5)
WBC: 7.6 10*3/uL (ref 4.0–10.5)

## 2014-09-04 LAB — I-STAT TROPONIN, ED: TROPONIN I, POC: 0.01 ng/mL (ref 0.00–0.08)

## 2014-09-04 MED ORDER — SODIUM CHLORIDE 0.9 % IV BOLUS (SEPSIS)
500.0000 mL | Freq: Once | INTRAVENOUS | Status: AC
  Administered 2014-09-05: 500 mL via INTRAVENOUS

## 2014-09-04 NOTE — ED Notes (Signed)
Husband at bedside, states pt last seen normal at 8a.

## 2014-09-04 NOTE — ED Provider Notes (Signed)
CSN: [id number]     Arrival date & time 09/04/14  2200 History   First MD Initiated Contact with Patient 09/04/14 2203     Chief Complaint  Patient presents with  . Altered Mental Status     (Consider location/radiation/quality/duration/timing/severity/associated sxs/prior Treatment) HPI Comments: Patient is a 58 year old [place] American female comes in with chief complaint of altered mental status. Patient past medical history of sickle cell trait, anxiety, hypertension, diabetes. Patient was last seen normal at 1 PM by her husband. She had no complaints this morning and was acting normally. Husband came home and noticed that she was naked and not acting right. She has been intermittently talking to her husband but has not been her normal self. Normally she does all ADLs. Patient cannot give any history as she is aphasic. Blood sugar normal in route. Patient does not drink alcohol or take drugs per her husband.   Level V caveat as patient aphasic  The history is provided by the spouse. The history is limited by the condition of the patient.    Past Medical History  Diagnosis Date  . Anxiety     Has been on Xanax since 2009. Uses it for stress, anxiety, and insomnia. No contract yet.  . Depression   . Sickle cell trait   . Diabetes mellitus     Type 2, non insulin dependent. Was dx'd prior to 2008.  Marland Kitchen Hypertension   . Chronic pain     Has OA of knees B. No Xrays in echart. Not requiring narcotics.  . Allergic rhinitis     Requires cetirizine, singulair, and fluticasone.  . Arthritis    History reviewed. No pertinent past surgical history. Family History  Problem Relation Age of Onset  . Sickle cell anemia Daughter   . Schizophrenia Daughter   . Diabetes Mother   . Heart disease Father   . Cancer Sister   . Cancer Sister    History  Substance Use Topics  . Smoking status: Never Smoker   . Smokeless tobacco: Not on file  . Alcohol Use: No   OB History   Grav Para Term  Preterm Abortions TAB SAB Ect Mult Living                 Review of Systems  Unable to perform ROS: Patient nonverbal      Allergies  Review of patient's allergies indicates no known allergies.  Home Medications   Prior to Admission medications   Medication Sig Start Date End Date Taking? Authorizing Provider  alprazolam Duanne Moron) 2 MG tablet Take 2 mg by mouth 3 (three) times daily as needed for anxiety.   Yes Historical Provider, MD  insulin aspart protamine- aspart (NOVOLOG MIX 70/30) (70-30) 100 UNIT/ML injection Inject into the skin every morning.    Yes Historical Provider, MD  Insulin Glargine (LANTUS SOLOSTAR) 100 UNIT/ML Solostar Pen Inject into the skin every evening.    Yes Historical Provider, MD  ciprofloxacin (CIPRO) 500 MG tablet Take 1 tablet (500 mg total) by mouth 2 (two) times daily. 08/11/14   Kalman Drape, MD  lisinopril (PRINIVIL,ZESTRIL) 10 MG tablet Take 10 mg by mouth daily.    Historical Provider, MD  meloxicam (MOBIC) 15 MG tablet Take 15 mg by mouth 2 (two) times daily as needed for pain.     Historical Provider, MD  metFORMIN (GLUCOPHAGE) 1000 MG tablet Take 1,000 mg by mouth 2 (two) times daily as needed (when insulin is not available).  Historical Provider, MD  metoCLOPramide (REGLAN) 10 MG tablet Take 1 tablet (10 mg total) by mouth 4 (four) times daily -  before meals and at bedtime. 08/11/14   Kalman Drape, MD  ondansetron (ZOFRAN ODT) 8 MG disintegrating tablet Take 1 tablet (8 mg total) by mouth every 8 (eight) hours as needed for nausea or vomiting. 08/11/14   Kalman Drape, MD   BP 163/81  Pulse 110  Temp(Src) 98.4 F (36.9 C) (Oral)  Resp 32  SpO2 100% Physical Exam  Nursing note and vitals reviewed. Constitutional: She appears well-developed and well-nourished. She appears distressed.  Patient aphasic, eyes open moving all extremities but does not follow command  HENT:  Head: Normocephalic and atraumatic.  Mouth/Throat: Oropharynx is clear  and moist. No oropharyngeal exudate.  No signs of head trauma  Eyes: Conjunctivae and EOM are normal. Pupils are equal, round, and reactive to light. Right eye exhibits no discharge. Left eye exhibits no discharge. No scleral icterus.  3 mm reactive  Neck: Normal range of motion. Neck supple.  Cardiovascular: Regular rhythm and normal heart sounds.   No murmur heard. Tachycardic regular  Pulmonary/Chest: Effort normal and breath sounds normal. No respiratory distress. She has no wheezes. She has no rales.  No respiratory distress  Abdominal: Soft. She exhibits no distension and no mass. There is no tenderness (no pain response).  Neurological: She is alert. She exhibits normal muscle tone. Coordination normal.  Patient resting: Alert. Aphasic. Cannot assess orientation. 4/5 strength in all extremities 2+ DTRs in patella and brachioradialis bilateral Mild high-frequency low amplitude tremors of bilateral lower extremity  Skin: Skin is warm. No rash noted. She is not diaphoretic.    ED Course  Procedures (including critical care time) Labs Review Labs Reviewed  CBC WITH DIFFERENTIAL - Abnormal; Notable for the following:    RBC 5.92 (*)    MCV 66.7 (*)    MCH 22.0 (*)    All other components within normal limits  COMPREHENSIVE METABOLIC PANEL - Abnormal; Notable for the following:    Glucose, Bld 126 (*)    Anion gap 16 (*)    All other components within normal limits  CBG MONITORING, ED - Abnormal; Notable for the following:    Glucose-Capillary 135 (*)    All other components within normal limits  AMMONIA  TSH  ETHANOL  URINE RAPID DRUG SCREEN (HOSP PERFORMED)  URINALYSIS, ROUTINE W REFLEX MICROSCOPIC  T4, FREE  I-STAT CG4 LACTIC ACID, ED  I-STAT TROPOININ, ED    Imaging Review Ct Head Wo Contrast  09/05/2014   CLINICAL DATA:  Altered mental status, aphasia and right facial droop, onset today. Initial encounter.  EXAM: CT HEAD WITHOUT CONTRAST  TECHNIQUE: Contiguous  axial images were obtained from the base of the skull through the vertex without intravenous contrast.  COMPARISON:  None.  FINDINGS: Skull and Sinuses:Negative for fracture or destructive process. The mastoids, middle ears, and imaged paranasal sinuses are clear.  Orbits: No acute abnormality.  Brain: Wedge of low-attenuation extending from the lower left sylvian fissure through the putamen, anterior limb internal capsule, and into the left caudate head. Given the clinical circumstances, this is likely an acute perforator infarct. 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 There is no hemorrhage. No hydrocephalus, mass lesion, or shift.  These results were called by telephone at the time of interpretation on 09/04/2014 at 11:58 pm to Dr. Sol Passer , who verbally acknowledged these results.  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   Electronically Signed   By: Jorje Guild M.D.   On: 09/05/2014 00:03   Dg Chest Portable 1 View  09/04/2014   CLINICAL DATA:  Altered mental status, 1 day history of RIGHT facial droop, decreased communication.  EXAM: PORTABLE CHEST - 1 VIEW  COMPARISON:  Chest radiograph Apr 01, 2014  FINDINGS: Cardiomediastinal silhouette is unremarkable. The lungs are clear without pleural effusions or focal consolidations. Trachea projects midline and there is no pneumothorax. Soft tissue planes and included osseous structures are non-suspicious.  IMPRESSION: No acute cardiopulmonary process.   Electronically Signed   By: Elon Alas   On: 09/04/2014 23:06     EKG Interpretation None      MDM    MDM: 58 year-old female less than normal at 1 PM with aphasia. Out of code stroke window due to time. Blood sugar normal. Concern for sepsis versus CVA versus metabolic encephalopathy. We'll check head CT labs chest x-ray. We'll give bolus of fluid. Continue to monitor. Blood studies negative. CT scan shows concern  for new infarct on left. Likely cause of symptoms based on patient presentation. Discussed with neurology, out of window for any type of intervention. Neurology to see, we will admit to hospitalist service. Admit.  Final diagnoses:  Stroke    Admit    Sol Passer, MD 09/05/14 [phone]

## 2014-09-04 NOTE — ED Notes (Signed)
To room via EMS.  Pts family reports pt has had ALOC today. Pt usually has conversation with family, today she is answering in yes/no, not following commands at times, inappropriate responses.  Last seen normal 6a-7a today.  EMS reports negative drift, pt would not smile on command but noticed right sided facial droop- family states this is not normal.  Hand grips equal/weak.  Pt ambulatory at scene.  No other s/s noted.

## 2014-09-05 ENCOUNTER — Inpatient Hospital Stay (HOSPITAL_COMMUNITY): Payer: Medicare Other

## 2014-09-05 DIAGNOSIS — I639 Cerebral infarction, unspecified: Principal | ICD-10-CM

## 2014-09-05 DIAGNOSIS — R29810 Facial weakness: Secondary | ICD-10-CM | POA: Diagnosis present

## 2014-09-05 DIAGNOSIS — G9341 Metabolic encephalopathy: Secondary | ICD-10-CM | POA: Diagnosis present

## 2014-09-05 DIAGNOSIS — F411 Generalized anxiety disorder: Secondary | ICD-10-CM | POA: Diagnosis present

## 2014-09-05 DIAGNOSIS — R64 Cachexia: Secondary | ICD-10-CM | POA: Diagnosis present

## 2014-09-05 DIAGNOSIS — I10 Essential (primary) hypertension: Secondary | ICD-10-CM

## 2014-09-05 DIAGNOSIS — I509 Heart failure, unspecified: Secondary | ICD-10-CM | POA: Diagnosis present

## 2014-09-05 DIAGNOSIS — M1990 Unspecified osteoarthritis, unspecified site: Secondary | ICD-10-CM | POA: Diagnosis present

## 2014-09-05 DIAGNOSIS — Z818 Family history of other mental and behavioral disorders: Secondary | ICD-10-CM | POA: Diagnosis not present

## 2014-09-05 DIAGNOSIS — J309 Allergic rhinitis, unspecified: Secondary | ICD-10-CM | POA: Diagnosis present

## 2014-09-05 DIAGNOSIS — Z681 Body mass index (BMI) 19 or less, adult: Secondary | ICD-10-CM | POA: Diagnosis not present

## 2014-09-05 DIAGNOSIS — D573 Sickle-cell trait: Secondary | ICD-10-CM

## 2014-09-05 DIAGNOSIS — F329 Major depressive disorder, single episode, unspecified: Secondary | ICD-10-CM | POA: Diagnosis present

## 2014-09-05 DIAGNOSIS — F801 Expressive language disorder: Secondary | ICD-10-CM | POA: Diagnosis present

## 2014-09-05 DIAGNOSIS — I2510 Atherosclerotic heart disease of native coronary artery without angina pectoris: Secondary | ICD-10-CM | POA: Diagnosis present

## 2014-09-05 DIAGNOSIS — E118 Type 2 diabetes mellitus with unspecified complications: Secondary | ICD-10-CM

## 2014-09-05 DIAGNOSIS — K5900 Constipation, unspecified: Secondary | ICD-10-CM | POA: Diagnosis not present

## 2014-09-05 DIAGNOSIS — Z833 Family history of diabetes mellitus: Secondary | ICD-10-CM | POA: Diagnosis not present

## 2014-09-05 DIAGNOSIS — K3184 Gastroparesis: Secondary | ICD-10-CM | POA: Diagnosis present

## 2014-09-05 DIAGNOSIS — R634 Abnormal weight loss: Secondary | ICD-10-CM | POA: Diagnosis present

## 2014-09-05 DIAGNOSIS — R000 Tachycardia, unspecified: Secondary | ICD-10-CM | POA: Diagnosis present

## 2014-09-05 DIAGNOSIS — F419 Anxiety disorder, unspecified: Secondary | ICD-10-CM | POA: Diagnosis present

## 2014-09-05 DIAGNOSIS — I059 Rheumatic mitral valve disease, unspecified: Secondary | ICD-10-CM

## 2014-09-05 DIAGNOSIS — I739 Peripheral vascular disease, unspecified: Secondary | ICD-10-CM | POA: Diagnosis present

## 2014-09-05 DIAGNOSIS — Z8249 Family history of ischemic heart disease and other diseases of the circulatory system: Secondary | ICD-10-CM | POA: Diagnosis not present

## 2014-09-05 DIAGNOSIS — Z809 Family history of malignant neoplasm, unspecified: Secondary | ICD-10-CM | POA: Diagnosis not present

## 2014-09-05 DIAGNOSIS — E46 Unspecified protein-calorie malnutrition: Secondary | ICD-10-CM | POA: Diagnosis present

## 2014-09-05 DIAGNOSIS — E119 Type 2 diabetes mellitus without complications: Secondary | ICD-10-CM | POA: Diagnosis present

## 2014-09-05 DIAGNOSIS — I429 Cardiomyopathy, unspecified: Secondary | ICD-10-CM | POA: Diagnosis present

## 2014-09-05 DIAGNOSIS — Z794 Long term (current) use of insulin: Secondary | ICD-10-CM | POA: Diagnosis not present

## 2014-09-05 DIAGNOSIS — G8929 Other chronic pain: Secondary | ICD-10-CM | POA: Diagnosis present

## 2014-09-05 DIAGNOSIS — I6350 Cerebral infarction due to unspecified occlusion or stenosis of unspecified cerebral artery: Secondary | ICD-10-CM | POA: Insufficient documentation

## 2014-09-05 DIAGNOSIS — Z9229 Personal history of other drug therapy: Secondary | ICD-10-CM | POA: Diagnosis not present

## 2014-09-05 DIAGNOSIS — R339 Retention of urine, unspecified: Secondary | ICD-10-CM | POA: Diagnosis not present

## 2014-09-05 HISTORY — DX: Cerebral infarction, unspecified: I63.9

## 2014-09-05 LAB — COMPREHENSIVE METABOLIC PANEL
ALBUMIN: 3.1 g/dL — AB (ref 3.5–5.2)
ALT: 7 U/L (ref 0–35)
ANION GAP: 13 (ref 5–15)
AST: 14 U/L (ref 0–37)
Alkaline Phosphatase: 44 U/L (ref 39–117)
BILIRUBIN TOTAL: 0.8 mg/dL (ref 0.3–1.2)
BUN: 11 mg/dL (ref 6–23)
CALCIUM: 9.2 mg/dL (ref 8.4–10.5)
CHLORIDE: 102 meq/L (ref 96–112)
CO2: 22 mEq/L (ref 19–32)
CREATININE: 0.54 mg/dL (ref 0.50–1.10)
GFR calc Af Amer: >90 mL/min (ref >90)
Glucose, Bld: 105 mg/dL — ABNORMAL HIGH (ref 70–99)
Potassium: 4 mEq/L (ref 3.7–5.3)
Sodium: 137 mEq/L (ref 137–147)
Total Protein: 7 g/dL (ref 6.0–8.3)

## 2014-09-05 LAB — CBC WITH DIFFERENTIAL/PLATELET
Basophils Absolute: 0.1 10*3/uL (ref 0.0–0.1)
Basophils Relative: 2 % — ABNORMAL HIGH (ref 0–1)
Eosinophils Absolute: 0.1 10*3/uL (ref 0.0–0.7)
Eosinophils Relative: 2 % (ref 0–5)
HEMATOCRIT: 35.3 % — AB (ref 36.0–46.0)
Hemoglobin: 12.1 g/dL (ref 12.0–15.0)
LYMPHS ABS: 1.9 10*3/uL (ref 0.7–4.0)
Lymphocytes Relative: 31 % (ref 12–46)
MCH: 22.2 pg — ABNORMAL LOW (ref 26.0–34.0)
MCHC: 34.3 g/dL (ref 30.0–36.0)
MCV: 64.8 fL — ABNORMAL LOW (ref 78.0–100.0)
MONOS PCT: 10 % (ref 3–12)
Monocytes Absolute: 0.6 10*3/uL (ref 0.1–1.0)
NEUTROS ABS: 3.4 10*3/uL (ref 1.7–7.7)
Neutrophils Relative %: 55 % (ref 43–77)
Platelets: 295 10*3/uL (ref 150–400)
RBC: 5.45 MIL/uL — ABNORMAL HIGH (ref 3.87–5.11)
RDW: 14.8 % (ref 11.5–15.5)
WBC: 6.1 10*3/uL (ref 4.0–10.5)

## 2014-09-05 LAB — GLUCOSE, CAPILLARY
GLUCOSE-CAPILLARY: 115 mg/dL — AB (ref 70–99)
Glucose-Capillary: 117 mg/dL — ABNORMAL HIGH (ref 70–99)
Glucose-Capillary: 116 mg/dL — ABNORMAL HIGH (ref 70–99)
Glucose-Capillary: 107 mg/dL — ABNORMAL HIGH (ref 70–99)
Glucose-Capillary: 121 mg/dL — ABNORMAL HIGH (ref 70–99)
Glucose-Capillary: 125 mg/dL — ABNORMAL HIGH (ref 70–99)

## 2014-09-05 LAB — LIPID PANEL
CHOL/HDL RATIO: 2.6 ratio
CHOLESTEROL: 143 mg/dL (ref 0–200)
HDL: 56 mg/dL (ref >39)
LDL Cholesterol: 75 mg/dL (ref 0–99)
Triglycerides: 58 mg/dL (ref <150)
VLDL: 12 mg/dL (ref 0–40)

## 2014-09-05 LAB — HEMOGLOBIN A1C
HEMOGLOBIN A1C: 6.2 % — AB (ref <5.7)
Mean Plasma Glucose: 131 mg/dL — ABNORMAL HIGH (ref <117)

## 2014-09-05 LAB — RAPID URINE DRUG SCREEN, HOSP PERFORMED
Amphetamines: NOT DETECTED
Barbiturates: NOT DETECTED
Benzodiazepines: NOT DETECTED
COCAINE: NOT DETECTED
OPIATES: NOT DETECTED
Tetrahydrocannabinol: NOT DETECTED

## 2014-09-05 LAB — URINALYSIS, ROUTINE W REFLEX MICROSCOPIC
BILIRUBIN URINE: NEGATIVE
GLUCOSE, UA: NEGATIVE mg/dL
HGB URINE DIPSTICK: NEGATIVE
Ketones, ur: NEGATIVE mg/dL
LEUKOCYTES UA: NEGATIVE
Nitrite: NEGATIVE
PH: 7 (ref 5.0–8.0)
PROTEIN: NEGATIVE mg/dL
Specific Gravity, Urine: 1.015 (ref 1.005–1.030)
Urobilinogen, UA: 1 mg/dL (ref 0.0–1.0)

## 2014-09-05 LAB — T4, FREE: FREE T4: 1.43 ng/dL (ref 0.80–1.80)

## 2014-09-05 LAB — PROTIME-INR
INR: 1.04 (ref 0.00–1.49)
PROTHROMBIN TIME: 13.6 s (ref 11.6–15.2)

## 2014-09-05 MED ORDER — GLUCERNA SHAKE PO LIQD
237.0000 mL | Freq: Two times a day (BID) | ORAL | Status: DC
  Administered 2014-09-06 – 2014-09-10 (×7): 237 mL via ORAL

## 2014-09-05 MED ORDER — ONDANSETRON 4 MG PO TBDP: 8.0000 mg | ORAL_TABLET | Freq: Three times a day (TID) | ORAL | Status: DC | PRN

## 2014-09-05 MED ORDER — HEPARIN SODIUM (PORCINE) 5000 UNIT/ML IJ SOLN
5000.0000 [IU] | Freq: Three times a day (TID) | INTRAMUSCULAR | Status: DC
  Administered 2014-09-05 – 2014-09-08 (×10): 5000 [IU] via SUBCUTANEOUS
  Filled 2014-09-05 (×12): qty 1

## 2014-09-05 MED ORDER — SENNOSIDES-DOCUSATE SODIUM 8.6-50 MG PO TABS: 1.0000 | ORAL_TABLET | Freq: Every evening | ORAL | Status: DC | PRN

## 2014-09-05 MED ORDER — ALPRAZOLAM 0.5 MG PO TABS
2.0000 mg | ORAL_TABLET | Freq: Two times a day (BID) | ORAL | Status: DC | PRN
  Filled 2014-09-05: qty 4

## 2014-09-05 MED ORDER — ATORVASTATIN CALCIUM 10 MG PO TABS
20.0000 mg | ORAL_TABLET | Freq: Every day | ORAL | Status: DC
  Administered 2014-09-05 – 2014-09-09 (×5): 20 mg via ORAL
  Filled 2014-09-05 (×5): qty 2

## 2014-09-05 MED ORDER — LISINOPRIL 10 MG PO TABS
10.0000 mg | ORAL_TABLET | Freq: Every day | ORAL | Status: DC
  Administered 2014-09-05 – 2014-09-10 (×6): 10 mg via ORAL
  Filled 2014-09-05 (×6): qty 1

## 2014-09-05 MED ORDER — ACETAMINOPHEN 325 MG PO TABS
650.0000 mg | ORAL_TABLET | ORAL | Status: DC | PRN
  Filled 2014-09-05: qty 2

## 2014-09-05 MED ORDER — INSULIN ASPART 100 UNIT/ML ~~LOC~~ SOLN
0.0000 [IU] | Freq: Three times a day (TID) | SUBCUTANEOUS | Status: DC
  Administered 2014-09-06: 3 [IU] via SUBCUTANEOUS
  Administered 2014-09-06 – 2014-09-07 (×2): 2 [IU] via SUBCUTANEOUS
  Administered 2014-09-07: 3 [IU] via SUBCUTANEOUS
  Administered 2014-09-08 – 2014-09-09 (×5): 2 [IU] via SUBCUTANEOUS
  Administered 2014-09-10: 3 [IU] via SUBCUTANEOUS

## 2014-09-05 MED ORDER — ATORVASTATIN CALCIUM 40 MG PO TABS: 40.0000 mg | ORAL_TABLET | Freq: Every day | ORAL | Status: DC

## 2014-09-05 MED ORDER — INSULIN ASPART 100 UNIT/ML ~~LOC~~ SOLN: 0.0000 [IU] | Freq: Four times a day (QID) | SUBCUTANEOUS | Status: DC

## 2014-09-05 MED ORDER — ASPIRIN EC 81 MG PO TBEC
81.0000 mg | DELAYED_RELEASE_TABLET | Freq: Every day | ORAL | Status: DC
  Administered 2014-09-05 – 2014-09-09 (×5): 81 mg via ORAL
  Filled 2014-09-05 (×5): qty 1

## 2014-09-05 MED ORDER — SODIUM CHLORIDE 0.9 % IV SOLN
INTRAVENOUS | Status: DC
  Administered 2014-09-05: 1000 mL via INTRAVENOUS

## 2014-09-05 MED ORDER — METOCLOPRAMIDE HCL 10 MG PO TABS
10.0000 mg | ORAL_TABLET | Freq: Three times a day (TID) | ORAL | Status: DC
  Administered 2014-09-05 – 2014-09-10 (×18): 10 mg via ORAL
  Filled 2014-09-05 (×17): qty 1

## 2014-09-05 MED ORDER — STROKE: EARLY STAGES OF RECOVERY BOOK
Freq: Once | Status: AC
  Administered 2014-09-05: 1
  Filled 2014-09-05: qty 1

## 2014-09-05 MED ORDER — INSULIN ASPART 100 UNIT/ML ~~LOC~~ SOLN: 0.0000 [IU] | Freq: Every day | SUBCUTANEOUS | Status: DC

## 2014-09-05 NOTE — Progress Notes (Signed)
*  PRELIMINARY RESULTS* Vascular Ultrasound Carotid Duplex (Doppler) has been completed.  Findings suggest 1-39% internal carotid artery stenosis bilaterally. Vertebral arteries are patent with antegrade flow.  09/05/2014 1:38 PM Maudry Mayhew, RVT, RDCS, RDMS

## 2014-09-05 NOTE — Progress Notes (Signed)
    [facility] has been requested to perform a transesophageal echocardiogram on Kristen Lozano 09/06/14 for CVA.  After careful review of history and examination, the risks and benefits of transesophageal echocardiogram have been explained including risks of esophageal damage, perforation (1:10,000 risk), bleeding, pharyngeal hematoma as well as other potential complications associated with conscious sedation including aspiration, arrhythmia, respiratory failure and death. Alternatives to treatment were discussed, questions were answered.   It is difficult to tell if pt understands or not. Flat affect  And does not follow commands. Tries to go back to sleep. Will notify Dr. Debara Pickett, no orders yet until he agrees. Will need speech to see and evaluate first.  58 y.o. female with acute onset of a neurological syndrome consistent with a left cortical stroke, most likely embolism to the left MCA branch distribution.  INGOLD,LAURA R, NP 09/05/2014 2:30 PM

## 2014-09-05 NOTE — H&P (Signed)
[facility] History and Physical  Patient: Kristen Lozano  PVX:[id number]  DOB: 05/02/1956  DOS: the patient was seen and examined on 09/05/2014 PCP: Elyn Peers, MD  Chief Complaint: Aphasia  HPI: Kristen Lozano is a 58 y.o. female with Past medical history of hypertension, depression, specifically, diabetes mellitus. The patient presented with complaints of aphasia. She was last seen and normal at around 1 PM. The history was obtained from patient's daughter as the patient has been nonspeaking. In the evening at around 6 PM when the daughter came to take a look at the patient she was confused and responding to every question with "yes". As per the EMS the noticed a facial droopiness on right. No further abnormalities has been reported by daughter. Patient did not have any recent chest pain, fever, cough, abdominal pain. She presented with nausea vomiting with concern for gastroparesis on 08/11/2014. Her blood sugar has been running in the range of 100-120. Patient has been compliant with all her medications.  The patient is coming from home. And at her baseline independent for most of her ADL.  Review of Systems: as mentioned in the history of present illness.  A Comprehensive review of the other systems is negative.  Past Medical History  Diagnosis Date  . Anxiety     Has been on Xanax since 2009. Uses it for stress, anxiety, and insomnia. No contract yet.  . Depression   . Sickle cell trait   . Diabetes mellitus     Type 2, non insulin dependent. Was dx'd prior to 2008.  Marland Kitchen Hypertension   . Chronic pain     Has OA of knees B. No Xrays in echart. Not requiring narcotics.  . Allergic rhinitis     Requires cetirizine, singulair, and fluticasone.  . Arthritis    History reviewed. No pertinent past surgical history. Social History:  reports that she has never smoked. She does not have any smokeless tobacco history on file. She reports that she does not drink alcohol or  use illicit drugs.  No Known Allergies  Family History  Problem Relation Age of Onset  . Sickle cell anemia Daughter   . Schizophrenia Daughter   . Diabetes Mother   . Heart disease Father   . Cancer Sister   . Cancer Sister     Prior to Admission medications   Medication Sig Start Date End Date Taking? Authorizing Provider  alprazolam Duanne Moron) 2 MG tablet Take 2 mg by mouth 3 (three) times daily as needed for anxiety.   Yes Historical Provider, MD  insulin aspart protamine- aspart (NOVOLOG MIX 70/30) (70-30) 100 UNIT/ML injection Inject into the skin every morning.    Yes Historical Provider, MD  Insulin Glargine (LANTUS SOLOSTAR) 100 UNIT/ML Solostar Pen Inject into the skin every evening.    Yes Historical Provider, MD  ciprofloxacin (CIPRO) 500 MG tablet Take 1 tablet (500 mg total) by mouth 2 (two) times daily. 08/11/14   Kalman Drape, MD  lisinopril (PRINIVIL,ZESTRIL) 10 MG tablet Take 10 mg by mouth daily.    Historical Provider, MD  meloxicam (MOBIC) 15 MG tablet Take 15 mg by mouth 2 (two) times daily as needed for pain.     Historical Provider, MD  metFORMIN (GLUCOPHAGE) 1000 MG tablet Take 1,000 mg by mouth 2 (two) times daily as needed (when insulin is not available).     Historical Provider, MD  metoCLOPramide (REGLAN) 10 MG tablet Take 1 tablet (10 mg total) by mouth 4 (  four) times daily -  before meals and at bedtime. 08/11/14   Kalman Drape, MD  ondansetron (ZOFRAN ODT) 8 MG disintegrating tablet Take 1 tablet (8 mg total) by mouth every 8 (eight) hours as needed for nausea or vomiting. 08/11/14   Kalman Drape, MD    Physical Exam: Filed Vitals:   09/05/14 0130 09/05/14 0142 09/05/14 0204 09/05/14 0400  BP: 156/84  162/89 129/89  Pulse: 114  108 113  Temp:  98.6 F (37 C) 97.7 F (36.5 C) 98.1 F (36.7 C)  TempSrc:   Axillary Oral  Resp: 16  18 22   Height:   5' 6"  (1.676 m)   Weight:   45.813 kg (101 lb)   SpO2: 99%  100% 100%    General: Alert, Awake and  aphasic and unable to follow command. Appear in mild distress Eyes: PERRL ENT: Oral Mucosa clear moist. Neck: no JVD Cardiovascular: S1 and S2 Present, aortic systolic Murmur, Peripheral Pulses Present Respiratory: Bilateral Air entry equal and Decreased, Clear to Auscultation, noCrackles, no wheezes Abdomen: Bowel Sound present, Soft and non tender Skin: no Rash Extremities: no Pedal edema, no calf tenderness Neurologic: Grossly no focal neuro deficit. Withdraws to painful stimuli bilaterally, sensation is present bilaterally, strength 4 x 5 bilaterally, reflexes present bilaterally,  A physic and unable to follow command stated  Labs on Admission:  CBC:  Recent Labs Lab 09/04/14 2240  WBC 7.6  NEUTROABS 4.3  HGB 13.0  HCT 39.5  MCV 66.7*  PLT 350    CMP     Component Value Date/Time   NA 139 09/04/2014 2240   K 4.0 09/04/2014 2240   CL 98 09/04/2014 2240   CO2 25 09/04/2014 2240   GLUCOSE 126* 09/04/2014 2240   BUN 12 09/04/2014 2240   CREATININE 0.60 09/04/2014 2240   CREATININE 0.67 02/18/2012 1518   CALCIUM 10.0 09/04/2014 2240   PROT 8.0 09/04/2014 2240   ALBUMIN 3.5 09/04/2014 2240   AST 15 09/04/2014 2240   ALT 8 09/04/2014 2240   ALKPHOS 49 09/04/2014 2240   BILITOT 0.8 09/04/2014 2240   GFRNONAA >90 09/04/2014 2240   GFRNONAA >89 02/18/2012 1518   GFRAA >90 09/04/2014 2240   GFRAA >89 02/18/2012 1518    No results found for this basename: LIPASE, AMYLASE,  in the last 168 hours  Recent Labs Lab 09/04/14 2240  AMMONIA 20    No results found for this basename: CKTOTAL, CKMB, CKMBINDEX, TROPONINI,  in the last 168 hours BNP (last 3 results) No results found for this basename: PROBNP,  in the last 8760 hours  Radiological Exams on Admission: Ct Head Wo Contrast  09/05/2014   CLINICAL DATA:  Altered mental status, aphasia and right facial droop, onset today. Initial encounter.  EXAM: CT HEAD WITHOUT CONTRAST  TECHNIQUE: Contiguous axial images were  obtained from the base of the skull through the vertex without intravenous contrast.  COMPARISON:  None.  FINDINGS: Skull and Sinuses:Negative for fracture or destructive process. The mastoids, middle ears, and imaged paranasal sinuses are clear.  Orbits: No acute abnormality.  Brain: Wedge of low-attenuation extending from the lower left sylvian fissure through the putamen, anterior limb internal capsule, and into the left caudate head. Given the clinical circumstances, this is likely an acute perforator infarct. 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 There is no hemorrhage. No hydrocephalus, mass lesion, or shift.  These results were called by telephone at the time of interpretation on  09/04/2014 at 11:58 pm to Dr. Sol Passer , who verbally acknowledged these results.  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   Electronically Signed   By: Jorje Guild M.D.   On: 09/05/2014 00:03   Dg Chest Portable 1 View  09/04/2014   CLINICAL DATA:  Altered mental status, 1 day history of RIGHT facial droop, decreased communication.  EXAM: PORTABLE CHEST - 1 VIEW  COMPARISON:  Chest radiograph Apr 01, 2014  FINDINGS: Cardiomediastinal silhouette is unremarkable. The lungs are clear without pleural effusions or focal consolidations. Trachea projects midline and there is no pneumothorax. Soft tissue planes and included osseous structures are non-suspicious.  IMPRESSION: No acute cardiopulmonary process.   Electronically Signed   By: Elon Alas   On: 09/04/2014 23:06    EKG: Independently reviewed. normal sinus rhythm, nonspecific ST and T waves changes.  Assessment/Plan Principal Problem:   CVA (cerebral infarction) Active Problems:   SICKLE CELL TRAIT   Anxiety state   Diabetes mellitus   HTN (hypertension)   1. CVA (cerebral infarction) The patient is presenting with complaints of aphasia and is found to have as left perforator  infarct and a CT scan. Patient was evaluated by the neurologist and was found not to be a candidate for TPA or any other intervention. Patient was recommended for hospitalist admission. At present she will be admitted in the hospital but we will keep her n.p.o. with speech therapy evaluation in the morning. PTOT consultation an MRI brain as well as further stroke workup. At present continue with aspirin per neurology.  2. History of diabetes. Patient has history of diabetes but her blood sugar has been running a lower side recently and she has not been eating significantly recently. Placing her on sensitive siding scale.  3. Tachycardia. Etiology currently unclear. Appears sinus on evaluation. Continue gentle IV hydration.  4. Hypertension. Resume antihypertensive medications in the morning  Advance goals of care discussion: Full code as per my discussion with daughter. Patient has a living will but she does not have any information, and we discussed with her father and bring the document tomorrow.  Consults: Neurology  DVT Prophylaxis: subcutaneous Heparin Nutrition: N.p.o.  Family Communication: Daughter was present at bedside, opportunity was given to ask question and all questions were answered satisfactorily at the time of interview. Disposition: Admitted to inpatient in telemetry unit.  Author: Berle Mull, MD [facility] Pager: [phone] 09/05/2014, 4:52 AM    If 7PM-7AM, please contact night-coverage [email or web address] Password [email or web address]

## 2014-09-05 NOTE — Progress Notes (Signed)
INITIAL NUTRITION ASSESSMENT  DOCUMENTATION CODES Per approved criteria  -Underweight   INTERVENTION: Glucerna Shake po BID, each supplement provides 220 kcal and 10 grams of protein RD to follow for nutrition care plan  NUTRITION DIAGNOSIS: Inadequate oral intake related to poor appetite, s/p CVA as evidenced by PO intake 25%  Goal: Pt to meet >/= 90% of their estimated nutrition needs   Monitor:  PO & supplemental intake, weight, labs, I/O's  Reason for Assessment: Malnutrition Screening Tool Report  58 y.o. female  Admitting Dx: CVA (cerebral infarction)  ASSESSMENT: 58 y.o. Female with PMH of HTN, depression & diabetes mellitus; presented with complaints of aphasia; found to have as left perforator infarct and a CT scan.  RD unable to obtain nutrition hx; pt unresponsive and laying in fetal position; per Malnutrition Screening Tool pt has been eating poorly because of a decreased appetite and has had recent weight lost without trying.  Per wt readings below, pt's weight has trended up since September 2015; pt meets criteria for underweight state; per Nurse Tech PO intake poor at lunch (25%); would benefit from addition of oral nutrition supplements; RD to order.  RD unable to complete Nutrition Focused Physical Exam at this time.  Height: Ht Readings from Last 1 Encounters:  09/05/14 5\' 6"  (1.676 m)    Weight: Wt Readings from Last 1 Encounters:  09/05/14 101 lb (45.813 kg)    Ideal Body Weight: 130 lb  % Ideal Body Weight: 77%  Wt Readings from Last 10 Encounters:  09/05/14 101 lb (45.813 kg)  08/10/14 96 lb (43.545 kg)  02/18/12 130 lb 9.6 oz (59.24 kg)  10/29/11 127 lb 3.2 oz (57.698 kg)  12/19/10 131 lb 4.8 oz (59.557 kg)  06/26/10 123 lb 11.2 oz (56.11 kg)  07/24/09 129 lb 1.6 oz (58.559 kg)  06/22/09 126 lb 1.6 oz (57.199 kg)  06/08/09 122 lb 4.8 oz (55.475 kg)  08/26/08 134 lb 14.4 oz (61.19 kg)    Usual Body Weight: 96 lb -- September 2015  %  Usual Body Weight: 105%  BMI:  Body mass index is 16.31 kg/(m^2).  Estimated Nutritional Needs: Kcal: 1400-1600 Protein: 65-75 gm Fluid: >/= 1.5 L  Skin: Intact  Diet Order: Heart Healthy/Carbohydrate Modified   EDUCATION NEEDS: -No education needs identified at this time  Labs:   Recent Labs Lab 09/04/14 2240 09/05/14 0705  NA 139 137  K 4.0 4.0  CL 98 102  CO2 25 22  BUN 12 11  CREATININE 0.60 0.54  CALCIUM 10.0 9.2  GLUCOSE 126* 105*    CBG (last 3)   Recent Labs  09/05/14 0650 09/05/14 0905 09/05/14 1214  GLUCAP 116* 115* 107*    Scheduled Meds: . aspirin EC  81 mg Oral Daily  . atorvastatin  20 mg Oral q1800  . heparin  5,000 Units Subcutaneous 3 times per day  . insulin aspart  0-9 Units Subcutaneous Q6H  . lisinopril  10 mg Oral Daily  . metoCLOPramide  10 mg Oral TID AC & HS    Continuous Infusions: . sodium chloride 1,000 mL (09/05/14 0317)    Past Medical History  Diagnosis Date  . Anxiety     Has been on Xanax since 2009. Uses it for stress, anxiety, and insomnia. No contract yet.  . Depression   . Sickle cell trait   . Diabetes mellitus     Type 2, non insulin dependent. Was dx'd prior to 2008.  Marland Kitchen Hypertension   .  Chronic pain     Has OA of knees B. No Xrays in echart. Not requiring narcotics.  . Allergic rhinitis     Requires cetirizine, singulair, and fluticasone.  . Arthritis     History reviewed. No pertinent past surgical history.  Arthur Holms, RD, LDN Pager #: [phone] After-Hours Pager #: [phone]

## 2014-09-05 NOTE — Progress Notes (Signed)
[facility] PROGRESS NOTE  Kristen Lozano VOH:[id number] DOB: 1956-02-15 DOA: 09/04/2014 PCP: Elyn Peers, MD   Brief narrative 58 y.o. female with past medical history of hypertension, depression,  diabetes mellitus with  Possible gastroparesis  presented with complaints of aphasia. She was last seen and normal about 6 hours prior to being found by daughter to be non verbal. As per daughter,  Patient was confused and responding to every question with "yes".  EMS  noticed a facial droopon the right. Head CT on admission showing a left perforator infarct. Admitted for further stroke w/up. Patient was out of therapeutic tPA window so did not receive tPA.   Assessment/Plan: Acute left sided stroke  head CT shows left perforator infarct involving large area of left MCA distrubution.has residual aphasia and poorly responds  to commands.  MRI brain, MRA head results pending, 2D echo , carotid dopplers  cleared swallow eval PT/OT eval. May benefit from CIR. continue daily aspirin. LDL of 75. Will add statin Allow permissive BP  DM type 2 Monitor on SSI. Check A1C  Sinus tachycardia  no clear etiology. Has PVCs. continue telemetry monitoring. Follow echo  Code Status: FULL  Family Communication: Husband and daughters at bedside Disposition Plan: Pending stroke w/up and PT eval   Consultants:  Stroke team  Procedures:  MRI brain  2D echo carotid doppler  Antibiotics:  none  HPI/Subjective: Admission H&P reviewed.   Objective: Filed Vitals:   09/05/14 0600  BP: 128/84  Pulse: 110  Temp: 98.1 F (36.7 C)  Resp: 18   No intake or output data in the 24 hours ending 09/05/14 1007 Filed Weights   09/05/14 0204  Weight: 45.813 kg (101 lb)    Exam:   General: NAD, flat affect, responding poorly  HEETN: rt facial. Droop, no pallor, moist mucosa  Cardiovascular: S1&S2 tachycardic, no muurmurs  Respiratory: clear b/l  Abdomen: soft, NT, ND,  BS+  Musculoskeletal:warm, no edema  CNS: global aphsia, rt facial droop. Has good tone in all extremities, unabel to check muscle strength, hyperreflexia in RLE, plantar withdrawing b/l, unable to assess sensation and cerebellar fn   Data Reviewed: Basic Metabolic Panel:  Recent Labs Lab 09/04/14 2240 09/05/14 0705  NA 139 137  K 4.0 4.0  CL 98 102  CO2 25 22  GLUCOSE 126* 105*  BUN 12 11  CREATININE 0.60 0.54  CALCIUM 10.0 9.2   Liver Function Tests:  Recent Labs Lab 09/04/14 2240 09/05/14 0705  AST 15 14  ALT 8 7  ALKPHOS 49 44  BILITOT 0.8 0.8  PROT 8.0 7.0  ALBUMIN 3.5 3.1*   No results found for this basename: LIPASE, AMYLASE,  in the last 168 hours  Recent Labs Lab 09/04/14 2240  AMMONIA 20   CBC:  Recent Labs Lab 09/04/14 2240 09/05/14 0705  WBC 7.6 6.1  NEUTROABS 4.3 3.4  HGB 13.0 12.1  HCT 39.5 35.3*  MCV 66.7* 64.8*  PLT 350 295   Cardiac Enzymes: No results found for this basename: CKTOTAL, CKMB, CKMBINDEX, TROPONINI,  in the last 168 hours BNP (last 3 results) No results found for this basename: PROBNP,  in the last 8760 hours CBG:  Recent Labs Lab 09/04/14 2229 09/05/14 0242 09/05/14 0650 09/05/14 0905  GLUCAP 135* 117* 116* 115*    No results found for this or any previous visit (from the past 240 hour(s)).   Studies: Ct Head Wo Contrast  09/05/2014   CLINICAL DATA:  Altered mental status,  aphasia and right facial droop, onset today. Initial encounter.  EXAM: CT HEAD WITHOUT CONTRAST  TECHNIQUE: Contiguous axial images were obtained from the base of the skull through the vertex without intravenous contrast.  COMPARISON:  None.  FINDINGS: Skull and Sinuses:Negative for fracture or destructive process. The mastoids, middle ears, and imaged paranasal sinuses are clear.  Orbits: No acute abnormality.  Brain: Wedge of low-attenuation extending from the lower left sylvian fissure through the putamen, anterior limb internal capsule,  and into the left caudate head. Given the clinical circumstances, this is likely an acute perforator infarct. 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 There is no hemorrhage. No hydrocephalus, mass lesion, or shift.  These results were called by telephone at the time of interpretation on 09/04/2014 at 11:58 pm to Dr. Sol Passer , who verbally acknowledged these results.  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   Electronically Signed   By: Jorje Guild M.D.   On: 09/05/2014 00:03   Dg Chest Portable 1 View  09/04/2014   CLINICAL DATA:  Altered mental status, 1 day history of RIGHT facial droop, decreased communication.  EXAM: PORTABLE CHEST - 1 VIEW  COMPARISON:  Chest radiograph Apr 01, 2014  FINDINGS: Cardiomediastinal silhouette is unremarkable. The lungs are clear without pleural effusions or focal consolidations. Trachea projects midline and there is no pneumothorax. Soft tissue planes and included osseous structures are non-suspicious.  IMPRESSION: No acute cardiopulmonary process.   Electronically Signed   By: Elon Alas   On: 09/04/2014 23:06    Scheduled Meds: . aspirin EC  81 mg Oral Daily  . heparin  5,000 Units Subcutaneous 3 times per day  . insulin aspart  0-9 Units Subcutaneous Q6H  . lisinopril  10 mg Oral Daily  . metoCLOPramide  10 mg Oral TID AC & HS   Continuous Infusions: . sodium chloride 1,000 mL (09/05/14 0317)      Time spent: 25 minutes    Zenia Guest, [place]  [facility] Pager [phone]. If 7PM-7AM, please contact night-coverage at [email or web address], password [email or web address] 09/05/2014, 10:07 AM  LOS: 1 day

## 2014-09-05 NOTE — Progress Notes (Signed)
Pt arrived on unit Rm [id number] from ED approx 0200hrs, no obvious distress, aphasic, family present, admissions notified, telemetry applied and central monitoring notified, pt and family oriented to room and unit. Family only provides limited information and becomes agitated with too many questions. Continued assessment as documented.

## 2014-09-05 NOTE — ED Notes (Signed)
Dr. Donzetta Kohut at bedside

## 2014-09-05 NOTE — Progress Notes (Signed)
STROKE TEAM PROGRESS NOTE   HISTORY Kristen Lozano is an 58 y.o. female with a past medical history significant for HTN, DM type 2, sickle cell trait, OA, brought in by family due to new onset language impairment and confusion. Never had similar symptoms before. Husband is at the bedside and said that she was doing well when he left the house around 1 pm yesterday 09/03/2014 but when he came back she was confused, naked, no acting right, and answering yes/no to all questions. He did not noticed facial droopiness and said that she was walking and the house without any trouble except for the confusion and inability to engage in conversation. CT brain after arrival to the ED showed a wedge of low-attenuation extending from the lower left sylvian fissure through the putamen, anterior limb internal capsule, and into the left caudate head. She is alert, awake, moving all limbs, but globally aphasic. Patient was not administered TPA secondary to late presentation. She was admitted for further evaluation and treatment.  SUBJECTIVE Her family is at the bedside.  Overall she feels her condition is unchanged. They are hopeful Dr. Erlinda Hong will find the reason for her problems. Her sister stated that for the last 2-3 months, she last 100lbs. She did not want to eat, and was told that her food did not go down. She has constipation for 1-2 days. For the last 10 days she eats fair. Friday had aphasia, went away in one hour.    OBJECTIVE Temp:  [97.7 F (36.5 C)-98.6 F (37 C)] 97.7 F (36.5 C) (10/12 1400) Pulse Rate:  [87-131] 108 (10/12 1400) Cardiac Rhythm:  [-] Sinus tachycardia (10/12 0300) Resp:  [13-32] 20 (10/12 1400) BP: (128-166)/(71-94) 138/80 mmHg (10/12 1400) SpO2:  [98 %-100 %] 100 % (10/12 1400) Weight:  [45.813 kg (101 lb)] 45.813 kg (101 lb) (10/12 0204)   Recent Labs Lab 09/05/14 0242 09/05/14 0650 09/05/14 0905 09/05/14 1214 09/05/14 1558  GLUCAP 117* 116* 115* 107* 121*    Recent  Labs Lab 09/04/14 2240 09/05/14 0705  NA 139 137  K 4.0 4.0  CL 98 102  CO2 25 22  GLUCOSE 126* 105*  BUN 12 11  CREATININE 0.60 0.54  CALCIUM 10.0 9.2    Recent Labs Lab 09/04/14 2240 09/05/14 0705  AST 15 14  ALT 8 7  ALKPHOS 49 44  BILITOT 0.8 0.8  PROT 8.0 7.0  ALBUMIN 3.5 3.1*    Recent Labs Lab 09/04/14 2240 09/05/14 0705  WBC 7.6 6.1  NEUTROABS 4.3 3.4  HGB 13.0 12.1  HCT 39.5 35.3*  MCV 66.7* 64.8*  PLT 350 295   No results found for this basename: CKTOTAL, CKMB, CKMBINDEX, TROPONINI,  in the last 168 hours  Recent Labs  09/05/14 0705  LABPROT 13.6  INR 1.04    Recent Labs  09/05/14 0021  COLORURINE YELLOW  LABSPEC 1.015  PHURINE 7.0  GLUCOSEU NEGATIVE  HGBUR NEGATIVE  BILIRUBINUR NEGATIVE  KETONESUR NEGATIVE  PROTEINUR NEGATIVE  UROBILINOGEN 1.0  NITRITE NEGATIVE  LEUKOCYTESUR NEGATIVE       Component Value Date/Time   CHOL 143 09/05/2014 0615   TRIG 58 09/05/2014 0615   HDL 56 09/05/2014 0615   CHOLHDL 2.6 09/05/2014 0615   VLDL 12 09/05/2014 0615   LDLCALC 75 09/05/2014 0615   Lab Results  Component Value Date   HGBA1C 6.2* 09/04/2014      Component Value Date/Time   LABOPIA NONE DETECTED 09/05/2014 0021   COCAINSCRNUR NONE  DETECTED 09/05/2014 0021   LABBENZ NONE DETECTED 09/05/2014 0021   AMPHETMU NONE DETECTED 09/05/2014 0021   THCU NONE DETECTED 09/05/2014 0021   LABBARB NONE DETECTED 09/05/2014 0021     Recent Labs Lab 09/04/14 2240  ETH <11    Ct Head Wo Contrast  09/05/2014   IMPRESSION: 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     Mr Jodene Nam Head Wo Contrast  09/05/2014   IMPRESSION: 1. Acute nonhemorrhagic infarct involving the anterior left lentiform nucleus and caudate head. 2. Acute nonhemorrhagic infarct involving the posterior left temporal and parietal lobe, potentially affecting Wernicke's area. 3. Distal small vessel disease is apparent on the MRA without  significant proximal stenosis, aneurysm, or branch vessel occlusion. 4. The right A1 segment is hypoplastic or possibly stenosed. The left A1 segment is dominant.      Dg Chest Portable 1 View  09/04/2014   IMPRESSION: No acute cardiopulmonary process.     Carotid Doppler  No evidence of hemodynamically significant internal carotid artery stenosis. Vertebral artery flow is antegrade.   2D Echocardiogram  LVEF 35-40%, severe global hypokinesis, normal wall thickness and chamber sizes, mild MR, diastolic dysfunction with normal LV filling pressure.  PHYSICAL EXAM Physical exam  Temp:  [97.7 F (36.5 C)-98.6 F (37 C)] 98.6 F (37 C) (10/12 2147) Pulse Rate:  [87-114] 112 (10/12 2147) Resp:  [15-32] 18 (10/12 2147) BP: (116-163)/(68-89) 116/68 mmHg (10/12 2147) SpO2:  [98 %-100 %] 98 % (10/12 2147) Weight:  [101 lb (45.813 kg)] 101 lb (45.813 kg) (10/12 0204)  General - cachectic, well developed, in no apparent distress.  Ophthalmologic - not cooperative on exam  Cardiovascular - Regular rate and rhythm with no murmur.  Neuro - awake alert, global aphasia, not following commands, nonverbal. PERRL, EOMI, right facial droop, spontaneously moving all extremities. Symmetrical on muscle strength testing. Reflex 1+. No babinski  ASSESSMENT/PLAN Kristen Lozano is a 58 y.o. female with history of  HTN, DM type 2, sickle cell trait, OA, presenting with new onset language impairment and confusion. She did not receive IV t-PA due to delay in arrival.   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   MRA  small vessel disease   Carotid Doppler  No significant stenosis   2D Echo  No source of embolus TEE to look for embolic source. Arranged with [facility] for tomorrow.  If positive for PFO (patent foramen ovale),  check bilateral lower extremity venous dopplers to rule out DVT as possible source of stroke. (I have made patient NPO after midnight tonight).  Given her young age, recommend checking hypercoagulable tests (lupus anticoagulant, cardiolipin antibody, prothrombin G20120A, homocysteine) and vasculitic labs (C3, C4, CH50, ANA, ESR, ANCA screen w/ reflex titer, Sjogren's Syndrome Antibodies [SSA & SSB]) as well as HIV and RPR as possible stroke sources if not already done. Recommend and pan CT scans to look for source of malignancy  LDL 75, recommend adding a statin to meet the joint commission core measure. Now on Lipitor 20  Heparin 5000 units sq tid for VTE prophylaxis    heart healthy/carbohydrate modified thin liquids.   no antithrombotics prior to admission, now on aspirin 81 mg orally every day  Ongoing aggressive risk factor management  Therapy recommendations:  pending   Disposition:  pending  Hypertension  Home meds:   lisinopril  Stable  Diabetes  HgbA1c 6.2, at Goal < 7.0  Controlled  Other Stroke Risk Factors   Cachectic Body mass index is 16.31 kg/(m^2).  Sickle cell trait  Other Pertinent History  Chronic pain  Recent 100 pound weight loss  Hospital day # 1  SHARON BIBY, MSN, RN, ANVP-BC, ANP-BC, [facility] Pager: [phone] 09/05/2014 5:53 PM   I, the attending vascular neurologist, have personally obtained a history, examined the patient, evaluated laboratory data, individually viewed imaging studies, and formulated the assessment and plan of care.  I have made any additions or clarifications directly to the above note and agree with the findings and plan as currently documented.   Rosalin Hawking, MD PhD Stroke Neurology 09/05/2014 11:41 PM  To contact Stroke Continuity provider, please refer to [email or web address] After hours, contact General Neurology

## 2014-09-05 NOTE — Progress Notes (Signed)
UR complete.  Shalamar Plourde RN, MSN 

## 2014-09-05 NOTE — Consult Note (Addendum)
Referring Physician:  Dr. Jeanell Sparrow    Chief Complaint: aphasia  HPI:                                                                                                                                         Kristen Lozano is an 58 y.o. female with a past medical history significant for HTN, DM type 2, sickle cell trait, OA, brought in by family due to new onset language impairment and confusion. Never had similar symptoms before. Husband is at the bedside and said that she was doing well when he left the house around 1 pm yesterday but when he came back she was confused, naked, no acting right, and answering yes/no to all questions. He did not noticed facial droopiness and said that she was walking and the house without any trouble except for the confusion and inability to engage in conversation. CT brain after arrival to the ED showed a wedge of low-attenuation extending from the lower left sylvian fissure through the putamen, anterior limb internal capsule, and into the left caudate head. At this moment she is alert, awake, moving all limbs, but globally aphasic. Date last known well: 09/03/14 Time last known well: 1 pm tPA Given: no, late presentation   Past Medical History  Diagnosis Date  . Anxiety     Has been on Xanax since 2009. Uses it for stress, anxiety, and insomnia. No contract yet.  . Depression   . Sickle cell trait   . Diabetes mellitus     Type 2, non insulin dependent. Was dx'd prior to 2008.  Marland Kitchen Hypertension   . Chronic pain     Has OA of knees B. No Xrays in echart. Not requiring narcotics.  . Allergic rhinitis     Requires cetirizine, singulair, and fluticasone.  . Arthritis     History reviewed. No pertinent past surgical history.  Family History  Problem Relation Age of Onset  . Sickle cell anemia Daughter   . Schizophrenia Daughter   . Diabetes Mother   . Heart disease Father   . Cancer Sister   . Cancer Sister    Social History:  reports that she has never  smoked. She does not have any smokeless tobacco history on file. She reports that she does not drink alcohol or use illicit drugs.  Allergies: No Known Allergies  Medications:  I have reviewed the patient's current medications.  ROS: unable to obtain due to global aphasia.                                                                                                History obtained from chart review and husband  Physical exam: pleasant female in no apparent distress. Blood pressure 159/87, pulse 113, temperature 98.3 F (36.8 C), temperature source Rectal, resp. rate 15, SpO2 100.00%. Head: normocephalic. Neck: supple, no bruits, no JVD. Cardiac: no murmurs. Lungs: clear. Abdomen: soft, no tender, no mass. Extremities: no edema. Neurologic Examination:                                                                                                      General: Mental Status: Alert and awake but with global aphasia. Cranial Nerves: II: Discs flat bilaterally; Visual fields grossly normal, pupils equal, round, reactive to light III,IV, VI: ptosis not present, extra-ocular motions intact bilaterally V,VII: smile asymmetric due to right face weakness, facial light touch sensation unreliable to test VIII: hearing normal bilaterally IX,X: gag reflex present XI: bilateral shoulder shrug no tested XII: midline tongue  Motor: Moves all limbs symmetrically. Tone normal  Sensory: localizes pain Deep Tendon Reflexes:  Brisk all over Plantars: Right: downgoing   Left: downgoing Cerebellar: Unable to test due to receptive aphasia Gait:  Unable to test  Results for orders placed during the hospital encounter of 09/04/14 (from the past 48 hour(s))  CBG MONITORING, ED     Status: Abnormal   Collection Time    09/04/14 10:29 PM      Result Value Ref Range    Glucose-Capillary 135 (*) 70 - 99 mg/dL   Comment 1 Notify RN     Comment 2 Call MD NNP PA CNM    CBC WITH DIFFERENTIAL     Status: Abnormal   Collection Time    09/04/14 10:40 PM      Result Value Ref Range   WBC 7.6  4.0 - 10.5 K/uL   RBC 5.92 (*) 3.87 - 5.11 MIL/uL   Hemoglobin 13.0  12.0 - 15.0 g/dL   HCT 39.5  36.0 - 46.0 %   MCV 66.7 (*) 78.0 - 100.0 fL   MCH 22.0 (*) 26.0 - 34.0 pg   MCHC 32.9  30.0 - 36.0 g/dL   RDW 14.9  11.5 - 15.5 %   Platelets 350  150 - 400 K/uL   Neutrophils Relative % 58  43 - 77 %   Lymphocytes Relative 31  12 - 46 %   Monocytes Relative 8  3 - 12 %   Eosinophils Relative 2  0 - 5 %  Basophils Relative 1  0 - 1 %   Neutro Abs 4.3  1.7 - 7.7 K/uL   Lymphs Abs 2.4  0.7 - 4.0 K/uL   Monocytes Absolute 0.6  0.1 - 1.0 K/uL   Eosinophils Absolute 0.2  0.0 - 0.7 K/uL   Basophils Absolute 0.1  0.0 - 0.1 K/uL   Smear Review MORPHOLOGY UNREMARKABLE    COMPREHENSIVE METABOLIC PANEL     Status: Abnormal   Collection Time    09/04/14 10:40 PM      Result Value Ref Range   Sodium 139  137 - 147 mEq/L   Potassium 4.0  3.7 - 5.3 mEq/L   Chloride 98  96 - 112 mEq/L   CO2 25  19 - 32 mEq/L   Glucose, Bld 126 (*) 70 - 99 mg/dL   BUN 12  6 - 23 mg/dL   Creatinine, Ser 0.60  0.50 - 1.10 mg/dL   Calcium 10.0  8.4 - 10.5 mg/dL   Total Protein 8.0  6.0 - 8.3 g/dL   Albumin 3.5  3.5 - 5.2 g/dL   AST 15  0 - 37 U/L   ALT 8  0 - 35 U/L   Alkaline Phosphatase 49  39 - 117 U/L   Total Bilirubin 0.8  0.3 - 1.2 mg/dL   GFR calc non Af Amer >90  >90 mL/min   GFR calc Af Amer >90  >90 mL/min   Comment: (NOTE)     The eGFR has been calculated using the CKD EPI equation.     This calculation has not been validated in all clinical situations.     eGFR's persistently <90 mL/min signify possible Chronic Kidney     Disease.   Anion gap 16 (*) 5 - 15  AMMONIA     Status: None   Collection Time    09/04/14 10:40 PM      Result Value Ref Range   Ammonia 20  11 - 60  umol/L  TSH     Status: None   Collection Time    09/04/14 10:40 PM      Result Value Ref Range   TSH 3.520  0.350 - 4.500 uIU/mL  ETHANOL     Status: None   Collection Time    09/04/14 10:40 PM      Result Value Ref Range   Alcohol, Ethyl (B) <11  0 - 11 mg/dL   Comment:            LOWEST DETECTABLE LIMIT FOR     SERUM ALCOHOL IS 11 mg/dL     FOR MEDICAL PURPOSES ONLY  I-STAT TROPOININ, ED     Status: None   Collection Time    09/04/14 10:55 PM      Result Value Ref Range   Troponin i, poc 0.01  0.00 - 0.08 ng/mL   Comment 3            Comment: Due to the release kinetics of cTnI,     a negative result within the first hours     of the onset of symptoms does not rule out     myocardial infarction with certainty.     If myocardial infarction is still suspected,     repeat the test at appropriate intervals.  I-STAT CG4 LACTIC ACID, ED     Status: None   Collection Time    09/04/14 10:57 PM      Result Value Ref Range   Lactic  Acid, Venous 1.53  0.5 - 2.2 mmol/L  URINALYSIS, ROUTINE W REFLEX MICROSCOPIC     Status: None   Collection Time    09/05/14 12:21 AM      Result Value Ref Range   Color, Urine YELLOW  YELLOW   APPearance CLEAR  CLEAR   Specific Gravity, Urine 1.015  1.005 - 1.030   pH 7.0  5.0 - 8.0   Glucose, UA NEGATIVE  NEGATIVE mg/dL   Hgb urine dipstick NEGATIVE  NEGATIVE   Bilirubin Urine NEGATIVE  NEGATIVE   Ketones, ur NEGATIVE  NEGATIVE mg/dL   Protein, ur NEGATIVE  NEGATIVE mg/dL   Urobilinogen, UA 1.0  0.0 - 1.0 mg/dL   Nitrite NEGATIVE  NEGATIVE   Leukocytes, UA NEGATIVE  NEGATIVE   Comment: MICROSCOPIC NOT DONE ON URINES WITH NEGATIVE PROTEIN, BLOOD, LEUKOCYTES, NITRITE, OR GLUCOSE <1000 mg/dL.   Ct Head Wo Contrast  09/05/2014   CLINICAL DATA:  Altered mental status, aphasia and right facial droop, onset today. Initial encounter.  EXAM: CT HEAD WITHOUT CONTRAST  TECHNIQUE: Contiguous axial images were obtained from the base of the skull through  the vertex without intravenous contrast.  COMPARISON:  None.  FINDINGS: Skull and Sinuses:Negative for fracture or destructive process. The mastoids, middle ears, and imaged paranasal sinuses are clear.  Orbits: No acute abnormality.  Brain: Wedge of low-attenuation extending from the lower left sylvian fissure through the putamen, anterior limb internal capsule, and into the left caudate head. Given the clinical circumstances, this is likely an acute perforator infarct. 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 There is no hemorrhage. No hydrocephalus, mass lesion, or shift.  These results were called by telephone at the time of interpretation on 09/04/2014 at 11:58 pm to Dr. Sol Passer , who verbally acknowledged these results.  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   Electronically Signed   By: Jorje Guild M.D.   On: 09/05/2014 00:03   Dg Chest Portable 1 View  09/04/2014   CLINICAL DATA:  Altered mental status, 1 day history of RIGHT facial droop, decreased communication.  EXAM: PORTABLE CHEST - 1 VIEW  COMPARISON:  Chest radiograph Apr 01, 2014  FINDINGS: Cardiomediastinal silhouette is unremarkable. The lungs are clear without pleural effusions or focal consolidations. Trachea projects midline and there is no pneumothorax. Soft tissue planes and included osseous structures are non-suspicious.  IMPRESSION: No acute cardiopulmonary process.   Electronically Signed   By: Elon Alas   On: 09/04/2014 23:06     Assessment: 58 y.o. female with acute onset of a neurological syndrome consistent with a left cortical stroke, most likely embolism to the left MCA branch distribution. She is out of the window for thrombolysis. Admit to medicine, complete stroke work up. ASA 81 mg after passing swallowing evaluation  Stroke Risk Factors - HTN, DM  Plan: 1. HgbA1c, fasting lipid panel 2. MRI, MRA  of the brain without  contrast 3. Echocardiogram 4. Carotid dopplers 5. Prophylactic therapy-aspirin 6. Risk factor modification 7. Telemetry monitoring 8. Frequent neuro checks 9. PT/OT SLP  Dorian Pod, MD [facility] [phone]  09/05/2014, 12:47 AM

## 2014-09-06 ENCOUNTER — Encounter (HOSPITAL_COMMUNITY)
Admission: EM | Disposition: A | Discharge status: Home Health | Payer: Self-pay | Source: Home / Self Care | Attending: Internal Medicine

## 2014-09-06 ENCOUNTER — Encounter (HOSPITAL_COMMUNITY): Payer: Self-pay | Admitting: Internal Medicine

## 2014-09-06 DIAGNOSIS — I639 Cerebral infarction, unspecified: Secondary | ICD-10-CM

## 2014-09-06 DIAGNOSIS — I429 Cardiomyopathy, unspecified: Secondary | ICD-10-CM

## 2014-09-06 DIAGNOSIS — I341 Nonrheumatic mitral (valve) prolapse: Secondary | ICD-10-CM

## 2014-09-06 DIAGNOSIS — I255 Ischemic cardiomyopathy: Secondary | ICD-10-CM

## 2014-09-06 HISTORY — PX: TEE WITHOUT CARDIOVERSION: SHX5443

## 2014-09-06 LAB — LIPID PANEL
CHOL/HDL RATIO: 2.9 ratio
CHOLESTEROL: 141 mg/dL (ref 0–200)
HDL: 49 mg/dL (ref >39)
LDL Cholesterol: 77 mg/dL (ref 0–99)
TRIGLYCERIDES: 76 mg/dL (ref <150)
VLDL: 15 mg/dL (ref 0–40)

## 2014-09-06 LAB — RPR

## 2014-09-06 LAB — HOMOCYSTEINE: HOMOCYSTEINE-NORM: 7.5 umol/L (ref 4.0–15.4)

## 2014-09-06 LAB — ANTITHROMBIN III: ANTITHROMB III FUNC: 95 % (ref 75–120)

## 2014-09-06 LAB — GLUCOSE, CAPILLARY
Glucose-Capillary: 127 mg/dL — ABNORMAL HIGH (ref 70–99)
Glucose-Capillary: 99 mg/dL (ref 70–99)
Glucose-Capillary: 94 mg/dL (ref 70–99)
Glucose-Capillary: 160 mg/dL — ABNORMAL HIGH (ref 70–99)
Glucose-Capillary: 115 mg/dL — ABNORMAL HIGH (ref 70–99)

## 2014-09-06 LAB — HIV ANTIBODY (ROUTINE TESTING W REFLEX): HIV 1&2 Ab, 4th Generation: NONREACTIVE

## 2014-09-06 LAB — SEDIMENTATION RATE: Sed Rate: 13 mm/hr (ref 0–22)

## 2014-09-06 SURGERY — ECHOCARDIOGRAM, TRANSESOPHAGEAL
Anesthesia: Moderate Sedation

## 2014-09-06 MED ORDER — BUTAMBEN-TETRACAINE-BENZOCAINE 2-2-14 % EX AERO
INHALATION_SPRAY | CUTANEOUS | Status: DC | PRN
  Administered 2014-09-06: 2 via TOPICAL

## 2014-09-06 MED ORDER — IOHEXOL 300 MG/ML  SOLN
25.0000 mL | INTRAMUSCULAR | Status: AC
  Administered 2014-09-06: 25 mL via ORAL

## 2014-09-06 MED ORDER — FENTANYL CITRATE 0.05 MG/ML IJ SOLN
INTRAMUSCULAR | Status: AC
  Filled 2014-09-06: qty 2

## 2014-09-06 MED ORDER — MIDAZOLAM HCL 10 MG/2ML IJ SOLN
INTRAMUSCULAR | Status: DC | PRN
  Administered 2014-09-06: 1 mg via INTRAVENOUS
  Administered 2014-09-06: 2 mg via INTRAVENOUS
  Administered 2014-09-06: 1 mg via INTRAVENOUS

## 2014-09-06 MED ORDER — LIDOCAINE VISCOUS 2 % MT SOLN
OROMUCOSAL | Status: AC
  Filled 2014-09-06: qty 15

## 2014-09-06 MED ORDER — FENTANYL CITRATE 0.05 MG/ML IJ SOLN
INTRAMUSCULAR | Status: DC | PRN
  Administered 2014-09-06: 25 ug via INTRAVENOUS

## 2014-09-06 MED ORDER — MIDAZOLAM HCL 5 MG/ML IJ SOLN
INTRAMUSCULAR | Status: AC
  Filled 2014-09-06: qty 2

## 2014-09-06 NOTE — Progress Notes (Signed)
  Echocardiogram 2D Echocardiogram has been performed.  Kristen Lozano 09/06/2014, 1:09 PM

## 2014-09-06 NOTE — H&P (Signed)
     INTERVAL PROCEDURE H&P  History and Physical Interval Note:  09/06/2014 10:29 AM  Noni Saupe has presented today for their planned procedure. The various methods of treatment have been discussed with the patient and family. After consideration of risks, benefits and other options for treatment, the patient has consented to the procedure.  The patients' outpatient history has been reviewed, patient examined, and no change in status from most recent office note within the past 30 days. I have reviewed the patients' chart and labs and will proceed as planned. Questions were answered to the patient's satisfaction.   Pixie Casino, MD, [facility] Attending Cardiologist [facility]  Analys Ryden C 09/06/2014, 10:29 AM

## 2014-09-06 NOTE — Progress Notes (Signed)
Echocardiogram Echocardiogram Transesophageal has been performed.  Kristen Lozano 09/06/2014, 11:28 AM

## 2014-09-06 NOTE — Progress Notes (Signed)
STROKE TEAM PROGRESS NOTE   HISTORY Kristen Lozano is an 58 y.o. female with a past medical history significant for HTN, DM type 2, sickle cell trait, OA, brought in by family due to new onset language impairment and confusion. Never had similar symptoms before. Husband is at the bedside and said that she was doing well when he left the house around 1 pm yesterday 09/03/2014 but when he came back she was confused, naked, no acting right, and answering yes/no to all questions. He did not noticed facial droopiness and said that she was walking and the house without any trouble except for the confusion and inability to engage in conversation. CT brain after arrival to the ED showed a wedge of low-attenuation extending from the lower left sylvian fissure through the putamen, anterior limb internal capsule, and into the left caudate head. She is alert, awake, moving all limbs, but globally aphasic. Patient was not administered TPA secondary to late presentation. She was admitted for further evaluation and treatment.  SUBJECTIVE Husband is at the bedside.  Overall she feels her condition is unchanged. Had TEE today and venous doppler.    OBJECTIVE Temp:  [97.9 F (36.6 C)-99.5 F (37.5 C)] 97.9 F (36.6 C) (10/13 2113) Pulse Rate:  [98-164] 106 (10/13 2113) Cardiac Rhythm:  [-] Sinus tachycardia (10/13 1200) Resp:  [9-33] 18 (10/13 2113) BP: (123-208)/(58-135) 141/74 mmHg (10/13 2113) SpO2:  [94 %-100 %] 100 % (10/13 2113)   Recent Labs Lab 09/06/14 0641 09/06/14 0852 09/06/14 1142 09/06/14 1639 09/06/14 2106  GLUCAP 127* 99 94 160* 115*    Recent Labs Lab 09/04/14 2240 09/05/14 0705  NA 139 137  K 4.0 4.0  CL 98 102  CO2 25 22  GLUCOSE 126* 105*  BUN 12 11  CREATININE 0.60 0.54  CALCIUM 10.0 9.2    Recent Labs Lab 09/04/14 2240 09/05/14 0705  AST 15 14  ALT 8 7  ALKPHOS 49 44  BILITOT 0.8 0.8  PROT 8.0 7.0  ALBUMIN 3.5 3.1*    Recent Labs Lab 09/04/14 2240  09/05/14 0705  WBC 7.6 6.1  NEUTROABS 4.3 3.4  HGB 13.0 12.1  HCT 39.5 35.3*  MCV 66.7* 64.8*  PLT 350 295   No results found for this basename: CKTOTAL, CKMB, CKMBINDEX, TROPONINI,  in the last 168 hours  Recent Labs  09/05/14 0705  LABPROT 13.6  INR 1.04    Recent Labs  09/05/14 0021  COLORURINE YELLOW  LABSPEC 1.015  PHURINE 7.0  GLUCOSEU NEGATIVE  HGBUR NEGATIVE  BILIRUBINUR NEGATIVE  KETONESUR NEGATIVE  PROTEINUR NEGATIVE  UROBILINOGEN 1.0  NITRITE NEGATIVE  LEUKOCYTESUR NEGATIVE       Component Value Date/Time   CHOL 141 09/05/2014 2308   TRIG 76 09/05/2014 2308   HDL 49 09/05/2014 2308   CHOLHDL 2.9 09/05/2014 2308   VLDL 15 09/05/2014 2308   LDLCALC 77 09/05/2014 2308   Lab Results  Component Value Date   HGBA1C 6.2* 09/04/2014      Component Value Date/Time   LABOPIA NONE DETECTED 09/05/2014 0021   COCAINSCRNUR NONE DETECTED 09/05/2014 0021   LABBENZ NONE DETECTED 09/05/2014 0021   AMPHETMU NONE DETECTED 09/05/2014 0021   THCU NONE DETECTED 09/05/2014 0021   LABBARB NONE DETECTED 09/05/2014 0021     Recent Labs Lab 09/04/14 2240  ETH <11    Ct Head Wo Contrast  09/05/2014   IMPRESSION: 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  Mr Jodene Nam Head Wo Contrast  09/05/2014   IMPRESSION: 1. Acute nonhemorrhagic infarct involving the anterior left lentiform nucleus and caudate head. 2. Acute nonhemorrhagic infarct involving the posterior left temporal and parietal lobe, potentially affecting Wernicke's area. 3. Distal small vessel disease is apparent on the MRA without significant proximal stenosis, aneurysm, or branch vessel occlusion. 4. The right A1 segment is hypoplastic or possibly stenosed. The left A1 segment is dominant.      Dg Chest Portable 1 View  09/04/2014   IMPRESSION: No acute cardiopulmonary process.     Carotid Doppler  No evidence of hemodynamically significant internal carotid artery  stenosis. Vertebral artery flow is antegrade.   2D Echocardiogram  LVEF 35-40%, severe global hypokinesis, normal wall thickness and chamber sizes, mild MR, diastolic dysfunction with normal LV filling pressure.  Venous doppler - Bilateral: No evidence of DVT, superficial thrombosis, or Baker's Cyst.  TEE -  1. No LAA thrombus. 2. No PFO 3. Severe global hypokinesis - LVEF 30-35%  Pan CT - pending  Hypercoagulable labs - pending  PHYSICAL EXAM  Temp:  [97.9 F (36.6 C)-99.5 F (37.5 C)] 97.9 F (36.6 C) (10/13 2113) Pulse Rate:  [98-164] 106 (10/13 2113) Resp:  [9-33] 18 (10/13 2113) BP: (123-208)/(58-135) 141/74 mmHg (10/13 2113) SpO2:  [94 %-100 %] 100 % (10/13 2113)  General - cachectic, well developed, in no apparent distress.  Ophthalmologic - not cooperative on exam  Cardiovascular - Regular rate and rhythm with no murmur.  Neuro - awake alert, global aphasia, not following commands, nonverbal. PERRL, EOMI, right facial droop, spontaneously moving all extremities. Symmetrical on muscle strength testing. Reflex 1+. No babinski  ASSESSMENT/PLAN Kristen Lozano is a 58 y.o. female with history of  HTN, DM type 2, sickle cell trait, OA, presenting with new onset language impairment and confusion. She did not receive IV t-PA due to delay in arrival.   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   MRA  small vessel disease   Carotid Doppler  No significant stenosis   2D Echo  No source of embolus TEE showed EF 30-35% - recommend cardiology consult  Will consider coumadin given cardiomyopathy with low EF and embolic stroke pattern Given her young age, recommend checking hypercoagulable tests (lupus anticoagulant, cardiolipin antibody, prothrombin G20120A, homocysteine) and vasculitic labs (C3, C4, CH50, ANA, ESR, ANCA  screen w/ reflex titer, Sjogren's Syndrome Antibodies [SSA & SSB]) as well as HIV and RPR as possible stroke sources if not already done. Recommend and pan CT scans to look for source of malignancy - pending  LDL 75, on Lipitor 20  Heparin 5000 units sq tid for VTE prophylaxis  Carb Control heart healthy/carbohydrate modified thin liquids.   no antithrombotics prior to admission, now on aspirin 81 mg orally every day  Ongoing aggressive risk factor management  Therapy recommendations:  pending   Disposition:  pending   Hypertension  Home meds:   lisinopril  Stable  Diabetes  HgbA1c 6.2, at Goal < 7.0  Controlled  Other Stroke Risk Factors   Cachectic Body mass index is 16.31 kg/(m^2).  Sickle cell trait  Other Pertinent History  Chronic pain  Recent 100 pound weight loss  Hospital day # 2  Burnetta Sabin, MSN, RN, ANVP-BC, ANP-BC, [facility] Pager: [phone] 09/06/2014 10:49 PM   I, the  attending vascular neurologist, have personally obtained a history, examined the patient, evaluated laboratory data, individually viewed imaging studies, and formulated the assessment and plan of care.  I have made any additions or clarifications directly to the above note and agree with the findings and plan as currently documented.   Rosalin Hawking, MD PhD Stroke Neurology 09/06/2014 10:49 PM  To contact Stroke Continuity provider, please refer to [email or web address] After hours, contact General Neurology

## 2014-09-06 NOTE — CV Procedure (Signed)
    TRANSESOPHAGEAL ECHOCARDIOGRAM (TEE) NOTE  INDICATIONS: stroke  PROCEDURE:   Informed consent was obtained prior to the procedure. The risks, benefits and alternatives for the procedure were discussed and the patient comprehended these risks.  Risks include, but are not limited to, cough, sore throat, vomiting, nausea, somnolence, esophageal and stomach trauma or perforation, bleeding, low blood pressure, aspiration, pneumonia, infection, trauma to the teeth and death.    After a procedural time-out, the patient was given 3 mg versed and 25 mcg fentanyl for moderate sedation.  The oropharynx was anesthetized 2 topical cetacaine sprays.  The transesophageal probe was inserted in the esophagus and stomach without difficulty and multiple views were obtained.  The patient was kept under observation until the patient left the procedure room.  The patient left the procedure room in stable condition.   Agitated microbubble saline contrast was administered.  COMPLICATIONS:    There were no immediate complications.  Findings:  1. LEFT VENTRICLE: The left ventricular wall thickness is mildly increased.  The left ventricular cavity is dilated in size. Wall motion is severely hypokinetic.  LVEF is 30-35%.  2. RIGHT VENTRICLE:  The right ventricle is normal in structure and function without any thrombus or masses.    3. LEFT ATRIUM:  The left atrium is dilated in size without any thrombus or masses.  There is not spontaneous echo contrast ("smoke") in the left atrium consistent with a low flow state.  4. LEFT ATRIAL APPENDAGE:  The left atrial appendage is free of any thrombus or masses. The appendage has single lobes. Pulse doppler indicates moderate flow in the appendage.  5. ATRIAL SEPTUM:  The atrial septum appears intact and is free of thrombus and/or masses.  There is no evidence for interatrial shunting by color doppler and saline microbubble.  6. RIGHT ATRIUM:  The right atrium is  normal in size and function without any thrombus or masses.  7. MITRAL VALVE:  The mitral valve is normal in structure and function with Mild regurgitation.  There were no vegetations or stenosis.  8. AORTIC VALVE:  The aortic valve is trileaflet, normal in structure and function with no regurgitation.  There were no vegetations or stenosis  9. TRICUSPID VALVE:  The tricuspid valve is normal in structure and function with Mild regurgitation.  There were no vegetations or stenosis  10.  PULMONIC VALVE:  The pulmonic valve is normal in structure and function with no regurgitation.  There were no vegetations or stenosis.   11. AORTIC ARCH, ASCENDING AND DESCENDING AORTA:  There was grade 1 Ron Parker et. Al, 1992) atherosclerosis of the proximal descending aorta.  12. PULMONARY VEINS: Anomalous pulmonary venous return was not noted.  13. PERICARDIUM: The pericardium appeared normal and non-thickened.  There is no pericardial effusion.  IMPRESSION:   1. No LAA thrombus. 2. No PFO 3. Severe global hypokinesis - LVEF 30-35%  RECOMMENDATIONS:    1. Further stroke management per neurology. 2. Cardiology evaluation is recommended in light of the patient's cardiomyopathy.  Time Spent Directly with the Patient:  30 minutes   Pixie Casino, MD, [facility] Attending Cardiologist [facility]  09/06/2014, 11:06 AM

## 2014-09-06 NOTE — Evaluation (Signed)
Physical Therapy Evaluation Patient Details Name: Kristen Lozano MRN: [id number] DOB: 23-Aug-1956 Today's Date: 09/06/2014   History of Present Illness  Kristen Lozano is a 58 y.o. female with Past medical history of hypertension, depression, diabetes mellitus admitted with confusion, language impairment. Imaging positive for CVA. 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   Clinical Impression  *Pt admitted with *acute CVA**. Pt currently with functional limitations due to the deficits listed below (see PT Problem List).  Pt will benefit from skilled PT to increase their independence and safety with mobility to allow discharge to the venue listed below.   Pt ambulated 150' with min/guard assist. She was non-verbal but shook her head for yes/no. She had some difficulty with following directions. HHPT recommended.   **    Follow Up Recommendations Home health PT    Equipment Recommendations  None recommended by PT    Recommendations for Other Services       Precautions / Restrictions Precautions Precautions: Fall Restrictions Weight Bearing Restrictions: No      Mobility  Bed Mobility Overal bed mobility: Needs Assistance Bed Mobility: Supine to Sit     Supine to sit: Mod assist     General bed mobility comments: assist to rise  Transfers Overall transfer level: Needs assistance Equipment used: 1 person hand held assist Transfers: Sit to/from Stand Sit to Stand: Mod assist         General transfer comment: assist to initiate movement  Ambulation/Gait Ambulation/Gait assistance: Min guard Ambulation Distance (Feet): 150 Feet Assistive device: None Gait Pattern/deviations: Step-through pattern;Decreased stride length;Narrow base of support   Gait velocity interpretation: Below  normal speed for age/gender General Gait Details: min/guard assist for safety/balance  Stairs            Wheelchair Mobility    Modified Rankin (Stroke Patients Only)       Balance Overall balance assessment: Needs assistance   Sitting balance-Leahy Scale: Good       Standing balance-Leahy Scale: Fair                               Pertinent Vitals/Pain Pain Assessment: No/denies pain    Home Living Family/patient expects to be discharged to:: Private residence Living Arrangements: Spouse/significant other Available Help at Discharge: Family;Available 24 hours/day Type of Home: House Home Access: Stairs to enter   [employer] of Steps: 1 Home Layout: Laundry or work area in basement;Able to live on main level with bedroom/bathroom (husband can do Medical sales representative) Home Equipment: Radio producer - single point      Prior Function Level of Independence: Independent               Hand Dominance        Extremity/Trunk Assessment               Lower Extremity Assessment: Difficult to assess due to impaired cognition (pt able to weight bear thru BLEs, unable to follow directions for manual muscle testing)      Cervical / Trunk Assessment: Normal  Communication   Communication: Expressive difficulties (pt did not verbalize, but shook head yes/no)  Cognition Arousal/Alertness: Awake/alert Behavior During Therapy: WFL for tasks assessed/performed;Flat affect Overall Cognitive Status: Difficult to assess  General Comments      Exercises        Assessment/Plan    PT Assessment Patient needs continued PT services  PT Diagnosis Altered mental status;Generalized weakness   PT Problem List Decreased balance;Decreased mobility;Decreased cognition  PT Treatment Interventions Gait training;Stair training;Functional mobility training;Therapeutic activities;Therapeutic exercise;Balance training;Patient/family  education   PT Goals (Current goals can be found in the Care Plan section) Acute Rehab PT Goals Patient Stated Goal: to get stronger PT Goal Formulation: With family Time For Goal Achievement: 09/20/14 Potential to Achieve Goals: Good    Frequency Min 4X/week   Barriers to discharge        Co-evaluation               End of Session Equipment Utilized During Treatment: Gait belt Activity Tolerance: Patient tolerated treatment well Patient left: in chair;with call bell/phone within reach;with family/visitor present Nurse Communication: Mobility status         Time: 6546-5035 PT Time Calculation (min): 18 min   Charges:   PT Evaluation $Initial PT Evaluation Tier I: 1 Procedure PT Treatments $Gait Training: 8-22 mins   PT G Codes:          Philomena Doheny 09/06/2014, 1:41 PM [phone]

## 2014-09-06 NOTE — Progress Notes (Signed)
I agree with the following treatment note after reviewing documentation.   Aaleeyah Bias, Brynn OTR/L Pager: [phone] Office: [phone] .   

## 2014-09-06 NOTE — Progress Notes (Signed)
[facility] PROGRESS NOTE  Kristen Lozano YQM:[id number] DOB: [birth year] DOA: 09/04/2014 PCP: Elyn Peers, MD   Brief narrative 58 y.o. female with past medical history of hypertension, depression,  diabetes mellitus with  Possible gastroparesis  presented with complaints of aphasia. She was last seen and normal about 6 hours prior to being found by daughter to be non verbal. As per daughter,  Patient was confused and responding to every question with "yes".  EMS  noticed a facial droopon the right. Head CT on admission showing a left perforator infarct. Admitted for further stroke w/up. Patient was out of therapeutic tPA window so did not receive tPA.   Assessment/Plan: Acute left sided stroke  head CT shows left perforator infarct involving large area of left MCA distrubution.has residual aphasia and poorly responds  to commands on admission. More alert and talking today, answering questions today although still has work finding difficultly. . No focal weakness onexam.  MRI brain, MRA head shows diffuse left sided infarct as outlined below , 2D echo shows EF of 35-40% with global hypokinesis , possible recent ischemic heart disease., carotid dopplers with non significant stenosis.  cleared swallow eval, tolerating diet PT/OT eval recommends HHPT continue daily aspirin. LDL of 75. Added statin Allow permissive BP hypercoagulable and vasculitis w/up sent    DM type 2 Monitor on SSI.  A1C of 6.2  Cardiomyopathy  possibly ischemic  add BB and ACEi upon discharge. Needs cardiology follow up as outpt ( may need stress test)  HTN  stable  malnutrition  added suppleemnts  Code Status: FULL  Family Communication: Husband  at bedside Disposition Plan: further neurology recs, possible d/c home in 1-2 days   Consultants:  Stroke team  Procedures:  MRI brain  2D echo carotid doppler  Antibiotics:  none  HPI/Subjective: Pt verbal today but still has word finding  difficulty  Objective: Filed Vitals:   09/06/14 1344  BP: 128/71  Pulse: 109  Temp: 98.1 F (36.7 C)  Resp: 18   No intake or output data in the 24 hours ending 09/06/14 1647 Filed Weights   09/05/14 0204  Weight: 45.813 kg (101 lb)    Exam:   General: NAD,   HEETN: rt facial. Droop,  moist mucosa  Cardiovascular: S1&S2 normal  no muurmurs  Respiratory: clear b/l  Abdomen: soft, NT, ND,   Musculoskeletal:warm, no edema  CNS: alert and awake, oriented 1x2, has word finding difficulty, rt facial droop. Good strength b/l   Data Reviewed: Basic Metabolic Panel:  Recent Labs Lab 09/04/14 2240 09/05/14 0705  NA 139 137  K 4.0 4.0  CL 98 102  CO2 25 22  GLUCOSE 126* 105*  BUN 12 11  CREATININE 0.60 0.54  CALCIUM 10.0 9.2   Liver Function Tests:  Recent Labs Lab 09/04/14 2240 09/05/14 0705  AST 15 14  ALT 8 7  ALKPHOS 49 44  BILITOT 0.8 0.8  PROT 8.0 7.0  ALBUMIN 3.5 3.1*   No results found for this basename: LIPASE, AMYLASE,  in the last 168 hours  Recent Labs Lab 09/04/14 2240  AMMONIA 20   CBC:  Recent Labs Lab 09/04/14 2240 09/05/14 0705  WBC 7.6 6.1  NEUTROABS 4.3 3.4  HGB 13.0 12.1  HCT 39.5 35.3*  MCV 66.7* 64.8*  PLT 350 295   Cardiac Enzymes: No results found for this basename: CKTOTAL, CKMB, CKMBINDEX, TROPONINI,  in the last 168 hours BNP (last 3 results) No results found for this basename:  PROBNP,  in the last 8760 hours CBG:  Recent Labs Lab 09/05/14 2120 09/06/14 0641 09/06/14 0852 09/06/14 1142 09/06/14 1639  GLUCAP 125* 127* 99 94 160*    No results found for this or any previous visit (from the past 240 hour(s)).   Studies: Ct Head Wo Contrast  09/05/2014   CLINICAL DATA:  Altered mental status, aphasia and right facial droop, onset today. Initial encounter.  EXAM: CT HEAD WITHOUT CONTRAST  TECHNIQUE: Contiguous axial images were obtained from the base of the skull through the vertex without intravenous  contrast.  COMPARISON:  None.  FINDINGS: Skull and Sinuses:Negative for fracture or destructive process. The mastoids, middle ears, and imaged paranasal sinuses are clear.  Orbits: No acute abnormality.  Brain: Wedge of low-attenuation extending from the lower left sylvian fissure through the putamen, anterior limb internal capsule, and into the left caudate head. Given the clinical circumstances, this is likely an acute perforator infarct. 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 There is no hemorrhage. No hydrocephalus, mass lesion, or shift.  These results were called by telephone at the time of interpretation on 09/04/2014 at 11:58 pm to Dr. Sol Passer , who verbally acknowledged these results.  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   Electronically Signed   By: Jorje Guild M.D.   On: 09/05/2014 00:03   Mr Jodene Nam Head Wo Contrast  09/05/2014   CLINICAL DATA:  A aphasia beginning at 1 o\'clock p.m. yesterday. Confusion. Right-sided facial droop. Initial encounter.  EXAM: MRI HEAD WITHOUT CONTRAST  MRA HEAD WITHOUT CONTRAST  TECHNIQUE: Multiplanar, multiecho pulse sequences of the brain and surrounding structures were obtained without intravenous contrast. Angiographic images of the head were obtained using MRA technique without contrast.  COMPARISON:  CT head without contrast 09/04/2014.  FINDINGS: MRI HEAD FINDINGS  The diffusion-weighted images confirm an acute nonhemorrhagic infarct involving the left lentiform nucleus extending superiorly into the caudate head. Additionally, there is cortical infarction within the posterior left temporal and parietal lobe. This involves or is just posterior to the expected location of Wernicke's area. No hemorrhage or mass lesion is present. The study is somewhat distorted by significant patient motion.  The examination had to be discontinued prior to completion due to patient being unable to  cooperate.  Flow is present in the major intracranial arteries. The globes and orbits are intact. The paranasal sinuses and mastoid air cells are clear.  MRA HEAD FINDINGS  Despite motion on other sequences, the patient was able level relatively still for the MRA sequence.  There is mild ectasia of the left internal carotid artery, particularly through the cavernous segment without a discrete aneurysm. The internal carotid arteries are otherwise within normal limits bilaterally. The right A1 segment is hypoplastic with a prominent posterior anterior communicating artery. Both A2 segments are well visualized on the left. The M1 segments are within normal limits. The MCA bifurcations are within normal limits bilaterally. There is some attenuation of distal MCA branch vessels.  The left vertebral artery is slightly dominant to the right. There is some tortuosity of the vertebral arteries bilaterally without significant stenoses. The PICA origins are not visualized. The basilar artery is within normal limits. Both posterior cerebral arteries originate from the basilar tip. There is some attenuation of distal PCA branch vessels.  IMPRESSION: 1. Acute nonhemorrhagic infarct involving the anterior left lentiform nucleus and caudate head. 2. Acute nonhemorrhagic infarct involving the posterior left temporal and parietal lobe,  potentially affecting Wernicke's area. 3. Distal small vessel disease is apparent on the MRA without significant proximal stenosis, aneurysm, or branch vessel occlusion. 4. The right A1 segment is hypoplastic or possibly stenosed. The left A1 segment is dominant.   Electronically Signed   By: Lawrence Santiago M.D.   On: 09/05/2014 10:58   Mr Brain Wo Contrast  09/05/2014   CLINICAL DATA:  A aphasia beginning at 1 o\'clock p.m. yesterday. Confusion. Right-sided facial droop. Initial encounter.  EXAM: MRI HEAD WITHOUT CONTRAST  MRA HEAD WITHOUT CONTRAST  TECHNIQUE: Multiplanar, multiecho pulse  sequences of the brain and surrounding structures were obtained without intravenous contrast. Angiographic images of the head were obtained using MRA technique without contrast.  COMPARISON:  CT head without contrast 09/04/2014.  FINDINGS: MRI HEAD FINDINGS  The diffusion-weighted images confirm an acute nonhemorrhagic infarct involving the left lentiform nucleus extending superiorly into the caudate head. Additionally, there is cortical infarction within the posterior left temporal and parietal lobe. This involves or is just posterior to the expected location of Wernicke's area. No hemorrhage or mass lesion is present. The study is somewhat distorted by significant patient motion.  The examination had to be discontinued prior to completion due to patient being unable to cooperate.  Flow is present in the major intracranial arteries. The globes and orbits are intact. The paranasal sinuses and mastoid air cells are clear.  MRA HEAD FINDINGS  Despite motion on other sequences, the patient was able level relatively still for the MRA sequence.  There is mild ectasia of the left internal carotid artery, particularly through the cavernous segment without a discrete aneurysm. The internal carotid arteries are otherwise within normal limits bilaterally. The right A1 segment is hypoplastic with a prominent posterior anterior communicating artery. Both A2 segments are well visualized on the left. The M1 segments are within normal limits. The MCA bifurcations are within normal limits bilaterally. There is some attenuation of distal MCA branch vessels.  The left vertebral artery is slightly dominant to the right. There is some tortuosity of the vertebral arteries bilaterally without significant stenoses. The PICA origins are not visualized. The basilar artery is within normal limits. Both posterior cerebral arteries originate from the basilar tip. There is some attenuation of distal PCA branch vessels.  IMPRESSION: 1. Acute  nonhemorrhagic infarct involving the anterior left lentiform nucleus and caudate head. 2. Acute nonhemorrhagic infarct involving the posterior left temporal and parietal lobe, potentially affecting Wernicke's area. 3. Distal small vessel disease is apparent on the MRA without significant proximal stenosis, aneurysm, or branch vessel occlusion. 4. The right A1 segment is hypoplastic or possibly stenosed. The left A1 segment is dominant.   Electronically Signed   By: Lawrence Santiago M.D.   On: 09/05/2014 10:58   Dg Chest Portable 1 View  09/04/2014   CLINICAL DATA:  Altered mental status, 1 day history of RIGHT facial droop, decreased communication.  EXAM: PORTABLE CHEST - 1 VIEW  COMPARISON:  Chest radiograph Apr 01, 2014  FINDINGS: Cardiomediastinal silhouette is unremarkable. The lungs are clear without pleural effusions or focal consolidations. Trachea projects midline and there is no pneumothorax. Soft tissue planes and included osseous structures are non-suspicious.  IMPRESSION: No acute cardiopulmonary process.   Electronically Signed   By: Elon Alas   On: 09/04/2014 23:06    Scheduled Meds: . aspirin EC  81 mg Oral Daily  . atorvastatin  20 mg Oral q1800  . feeding supplement (GLUCERNA SHAKE)  237 mL Oral BID  BM  . heparin  5,000 Units Subcutaneous 3 times per day  . insulin aspart  0-15 Units Subcutaneous TID WC  . insulin aspart  0-5 Units Subcutaneous QHS  . lisinopril  10 mg Oral Daily  . metoCLOPramide  10 mg Oral TID AC & HS   Continuous Infusions: . sodium chloride 1,000 mL (09/05/14 0317)      Time spent: 25 minutes    [name], [place]  [facility] Pager [phone]. If 7PM-7AM, please contact night-coverage at [email or web address], password [facility] 09/06/2014, 4:47 PM  LOS: 2 days

## 2014-09-06 NOTE — Progress Notes (Signed)
VASCULAR LAB PRELIMINARY  PRELIMINARY  PRELIMINARY  PRELIMINARY  Bilateral lower extremity venous duplex  completed.    Preliminary report:  Bilateral:  No evidence of DVT, superficial thrombosis, or Baker's Cyst.    Jahmere Bramel, RVT 09/06/2014, 4:04 PM

## 2014-09-06 NOTE — Progress Notes (Signed)
OT Cancellation Note  Patient Details Name: KAYLENN CIVIL MRN: [id number] DOB: 06-Jul-1956   Cancelled Treatment:    Reason Eval/Treat Not Completed: Patient at procedure or test/ unavailable (Vascular)  Redmond Baseman 09/06/2014, 4:00 PM

## 2014-09-07 ENCOUNTER — Encounter (HOSPITAL_COMMUNITY): Payer: Self-pay | Admitting: Radiology

## 2014-09-07 ENCOUNTER — Other Ambulatory Visit: Payer: Self-pay | Admitting: Neurology

## 2014-09-07 ENCOUNTER — Inpatient Hospital Stay (HOSPITAL_COMMUNITY): Payer: Medicare Other

## 2014-09-07 DIAGNOSIS — I429 Cardiomyopathy, unspecified: Secondary | ICD-10-CM

## 2014-09-07 DIAGNOSIS — R339 Retention of urine, unspecified: Secondary | ICD-10-CM

## 2014-09-07 DIAGNOSIS — I639 Cerebral infarction, unspecified: Secondary | ICD-10-CM

## 2014-09-07 LAB — SJOGRENS SYNDROME-A EXTRACTABLE NUCLEAR ANTIBODY: SSA (RO) (ENA) ANTIBODY, IGG: NEGATIVE

## 2014-09-07 LAB — URINALYSIS, ROUTINE W REFLEX MICROSCOPIC
BILIRUBIN URINE: NEGATIVE
Glucose, UA: NEGATIVE mg/dL
Hgb urine dipstick: NEGATIVE
Ketones, ur: NEGATIVE mg/dL
Leukocytes, UA: NEGATIVE
NITRITE: NEGATIVE
PH: 6 (ref 5.0–8.0)
Protein, ur: NEGATIVE mg/dL
SPECIFIC GRAVITY, URINE: 1.021 (ref 1.005–1.030)
UROBILINOGEN UA: 1 mg/dL (ref 0.0–1.0)

## 2014-09-07 LAB — SJOGRENS SYNDROME-B EXTRACTABLE NUCLEAR ANTIBODY: SSB (La) (ENA) Antibody, IgG: <1

## 2014-09-07 LAB — CBC
HCT: 35.5 % — ABNORMAL LOW (ref 36.0–46.0)
Hemoglobin: 12 g/dL (ref 12.0–15.0)
MCH: 21.9 pg — AB (ref 26.0–34.0)
MCHC: 33.8 g/dL (ref 30.0–36.0)
MCV: 64.9 fL — ABNORMAL LOW (ref 78.0–100.0)
PLATELETS: 312 10*3/uL (ref 150–400)
RBC: 5.47 MIL/uL — AB (ref 3.87–5.11)
RDW: 14.7 % (ref 11.5–15.5)
WBC: 5.9 10*3/uL (ref 4.0–10.5)

## 2014-09-07 LAB — GLUCOSE, CAPILLARY
GLUCOSE-CAPILLARY: 172 mg/dL — AB (ref 70–99)
GLUCOSE-CAPILLARY: 154 mg/dL — AB (ref 70–99)
Glucose-Capillary: 135 mg/dL — ABNORMAL HIGH (ref 70–99)
Glucose-Capillary: 122 mg/dL — ABNORMAL HIGH (ref 70–99)

## 2014-09-07 LAB — C4 COMPLEMENT: COMPLEMENT C4, BODY FLUID: 39 mg/dL (ref 10–40)

## 2014-09-07 LAB — MPO/PR-3 (ANCA) ANTIBODIES: Myeloperoxidase Abs: <1

## 2014-09-07 LAB — ANCA SCREEN W REFLEX TITER
Atypical p-ANCA Screen: NEGATIVE
c-ANCA Screen: NEGATIVE
p-ANCA Screen: NEGATIVE

## 2014-09-07 LAB — C3 COMPLEMENT: C3 Complement: 99 mg/dL (ref 90–180)

## 2014-09-07 MED ORDER — DOCUSATE SODIUM 100 MG PO CAPS: 100.0000 mg | ORAL_CAPSULE | Freq: Two times a day (BID) | ORAL | Status: DC

## 2014-09-07 MED ORDER — IOHEXOL 300 MG/ML  SOLN: 80.0000 mL | Freq: Once | INTRAMUSCULAR | Status: AC | PRN

## 2014-09-07 MED ORDER — CARVEDILOL 6.25 MG PO TABS
6.2500 mg | ORAL_TABLET | Freq: Two times a day (BID) | ORAL | Status: DC
  Administered 2014-09-07 – 2014-09-09 (×4): 6.25 mg via ORAL
  Filled 2014-09-07 (×4): qty 1

## 2014-09-07 MED ORDER — DEXTROSE 5 % IV SOLN
1.0000 g | INTRAVENOUS | Status: DC
  Administered 2014-09-07 – 2014-09-09 (×3): 1 g via INTRAVENOUS
  Filled 2014-09-07 (×3): qty 10

## 2014-09-07 MED ORDER — POLYETHYLENE GLYCOL 3350 17 G PO PACK
17.0000 g | PACK | Freq: Every day | ORAL | Status: DC
  Administered 2014-09-07 – 2014-09-09 (×2): 17 g via ORAL
  Filled 2014-09-07 (×3): qty 1

## 2014-09-07 NOTE — Care Management Note (Addendum)
  Page 1 of 1   09/07/2014     3:55:11 PM CARE MANAGEMENT NOTE 09/07/2014  Patient:  Kristen Lozano, Kristen Lozano   Account Number:  [id number]  Date Initiated:  09/05/2014  Documentation initiated by:  Lorne Skeens  Subjective/Objective Assessment:   Patient was admitted with CVA.  Lives at home with spouse     Action/Plan:   Will follow for discharge needs pending PT/OT evals and physician orders.   Anticipated DC Date:     Anticipated DC Plan:           Choice offered to / List presented to:             Status of service:  In process, will continue to follow Medicare Important Message given?  YES (If response is "NO", the following Medicare IM given date fields will be blank) Date Medicare IM given:  09/07/2014 Medicare IM given by:  Lorne Skeens Date Additional Medicare IM given:   Additional Medicare IM given by:    Discharge Disposition:    Per UR Regulation:  Reviewed for med. necessity/level of care/duration of stay  If discussed at [place] of Stay Meetings, dates discussed:    Comments:  09/07/14 [place], MSN, CM- Medicare IM letter provided.

## 2014-09-07 NOTE — Progress Notes (Signed)
I agree with the following treatment note after reviewing documentation.   Tashanna Dolin, Brynn OTR/L Pager: [phone] Office: [phone] .   

## 2014-09-07 NOTE — Progress Notes (Signed)
STROKE TEAM PROGRESS NOTE   HISTORY Kristen Lozano is an 58 y.o. female with a past medical history significant for HTN, DM type 2, sickle cell trait, OA, brought in by family due to new onset language impairment and confusion. Never had similar symptoms before. Husband is at the bedside and said that she was doing well when he left the house around 1 pm yesterday 09/03/2014 but when he came back she was confused, naked, no acting right, and answering yes/no to all questions. He did not noticed facial droopiness and said that she was walking and the house without any trouble except for the confusion and inability to engage in conversation. CT brain after arrival to the ED showed a wedge of low-attenuation extending from the lower left sylvian fissure through the putamen, anterior limb internal capsule, and into the left caudate head. She is alert, awake, moving all limbs, but globally aphasic. Patient was not administered TPA secondary to late presentation. She was admitted for further evaluation and treatment.  SUBJECTIVE Her daughter is at the bedside.  Working with therapy. Her daughter thinks she is not revealing her true abilities. However, pt speech is much better than 2 days ago, able to follow some commands and speak short sentences. Pan CT did not show malignancy but TEE showed low EF and cardiology was consulted.   OBJECTIVE Temp:  [97.9 F (36.6 C)-99.1 F (37.3 C)] 98.7 F (37.1 C) (10/14 0553) Pulse Rate:  [102-111] 111 (10/14 0553) Cardiac Rhythm:  [-] Sinus tachycardia (10/14 0800) Resp:  [16-20] 18 (10/14 0553) BP: (128-149)/(71-80) 137/80 mmHg (10/14 0553) SpO2:  [94 %-100 %] 98 % (10/14 0553)   Recent Labs Lab 09/06/14 0852 09/06/14 1142 09/06/14 1639 09/06/14 2106 09/07/14 0633  GLUCAP 99 94 160* 115* 135*    Recent Labs Lab 09/04/14 2240 09/05/14 0705  NA 139 137  K 4.0 4.0  CL 98 102  CO2 25 22  GLUCOSE 126* 105*  BUN 12 11  CREATININE 0.60 0.54  CALCIUM  10.0 9.2    Recent Labs Lab 09/04/14 2240 09/05/14 0705  AST 15 14  ALT 8 7  ALKPHOS 49 44  BILITOT 0.8 0.8  PROT 8.0 7.0  ALBUMIN 3.5 3.1*    Recent Labs Lab 09/04/14 2240 09/05/14 0705  WBC 7.6 6.1  NEUTROABS 4.3 3.4  HGB 13.0 12.1  HCT 39.5 35.3*  MCV 66.7* 64.8*  PLT 350 295   No results found for this basename: CKTOTAL, CKMB, CKMBINDEX, TROPONINI,  in the last 168 hours  Recent Labs  09/05/14 0705  LABPROT 13.6  INR 1.04    Recent Labs  09/05/14 0021  COLORURINE YELLOW  LABSPEC 1.015  PHURINE 7.0  GLUCOSEU NEGATIVE  HGBUR NEGATIVE  BILIRUBINUR NEGATIVE  KETONESUR NEGATIVE  PROTEINUR NEGATIVE  UROBILINOGEN 1.0  NITRITE NEGATIVE  LEUKOCYTESUR NEGATIVE       Component Value Date/Time   CHOL 141 09/05/2014 2308   TRIG 76 09/05/2014 2308   HDL 49 09/05/2014 2308   CHOLHDL 2.9 09/05/2014 2308   VLDL 15 09/05/2014 2308   LDLCALC 77 09/05/2014 2308   Lab Results  Component Value Date   HGBA1C 6.2* 09/04/2014      Component Value Date/Time   LABOPIA NONE DETECTED 09/05/2014 0021   COCAINSCRNUR NONE DETECTED 09/05/2014 0021   LABBENZ NONE DETECTED 09/05/2014 0021   AMPHETMU NONE DETECTED 09/05/2014 0021   THCU NONE DETECTED 09/05/2014 0021   LABBARB NONE DETECTED 09/05/2014 0021  Recent Labs Lab 09/04/14 [place] <11    Ct Head Wo Contrast 09/05/2014    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     Mr Jodene Nam Head Wo Contrast 09/05/2014    1. Acute nonhemorrhagic infarct involving the anterior left lentiform nucleus and caudate head. 2. Acute nonhemorrhagic infarct involving the posterior left temporal and parietal lobe, potentially affecting Wernicke's area. 3. Distal small vessel disease is apparent on the MRA without significant proximal stenosis, aneurysm, or branch vessel occlusion. 4. The right A1 segment is hypoplastic or possibly stenosed. The left A1 segment is dominant.      Dg Chest Portable 1  View 09/04/2014    No acute cardiopulmonary process.     Carotid Doppler  No evidence of hemodynamically significant internal carotid artery stenosis. Vertebral artery flow is antegrade.   2D Echocardiogram  LVEF 35-40%, severe global hypokinesis, normal wall thickness and chamber sizes, mild MR, diastolic dysfunction with normal LV filling pressure.  Venous doppler - Bilateral: No evidence of DVT, superficial thrombosis, or Baker's Cyst.  TEE -  1. No LAA thrombus.  2. No PFO  3. Severe global hypokinesis - LVEF 30-35%  Pan CT non-revealing for malignancy  Ischemic Hypercoagulable & Vasculitic Workup Normal - RPR, ESR, HIV, antithrombin III,  Homocysteine, C3, C4 Pending - Protein C & S,  CH50, ANA, lupus anticoagulant, cardiolipin antibody, prothrombin gene mutation, ANCA screen, Mpo/pr-3 (anca), SSA & SSB   PHYSICAL EXAM Temp:  [97.9 F (36.6 C)-99.1 F (37.3 C)] 98.7 F (37.1 C) (10/14 0553) Pulse Rate:  [102-111] 111 (10/14 0553) Resp:  [16-20] 18 (10/14 0553) BP: (128-149)/(71-80) 137/80 mmHg (10/14 0553) SpO2:  [94 %-100 %] 98 % (10/14 0553)  General - cachectic, well developed, in no apparent distress.  Ophthalmologic - not cooperative on exam  Cardiovascular - Regular rate and rhythm with no murmur.  Neuro - awake alert, partial global aphasia, able to following some simple commands but not complex commands, able to see short sentences but still largely impaired. PERRL, EOMI, right facial droop, spontaneously moving all extremities. Symmetrical on muscle strength testing. Reflex 1+. No babinski  ASSESSMENT/PLAN [name] is a 58 y.o. female with history of  HTN, DM type 2, sickle cell trait, OA, presenting with new onset language impairment and confusion. She did not receive IV t-PA due to delay in arrival.   Stroke:  Dominant left MCA infarcts secondary to unknown embolic source   MRI  anterior left lentiform nucleus and caudate head infarct and posterior  left temporal and parietal lobe infarct.   MRA  small vessel disease   Carotid Doppler  No significant stenosis   2D Echo  No source of embolus TEE showed EF 30-35% - recommend cardiology consult  Will consider coumadin given cardiomyopathy with low EF and embolic stroke pattern Given her young age, recommend checking hypercoagulable tests (lupus anticoagulant, cardiolipin antibody, prothrombin G20120A, homocysteine) and vasculitic labs (C3, C4, CH50, ANA, ESR, ANCA screen w/ reflex titer, Sjogren's Syndrome Antibodies [SSA & SSB]) as well as HIV and RPR as possible stroke sources if not already done. Pan CT non-revealing for malignancy  LDL 75, on Lipitor 20  Heparin 5000 units sq tid for VTE prophylaxis  Carb Control heart healthy/carbohydrate modified thin liquids.   no antithrombotics prior to admission, now on aspirin 81 mg orally every day  Ongoing aggressive risk factor management  Resultant aphasia which is improving, right facial weakness  Therapy recommendations:  CIR. Have placed consult order  Disposition:  pending  Cardiomyopathy - cardiology consult - EF 64-29% - with embolic stroke, we will recommend coumadin for stroke prevention - due to the stroke size and her possible procedure , would recommend to start coumadin on 10/16 (day 5) with expectation of goal INR at day 7-10 post stroke. INR goal 2-3.   Hypertension  Home meds:   lisinopril  Stable  Diabetes  HgbA1c 6.2, at Goal < 7.0  Controlled  Other Stroke Risk Factors   Cachectic Body mass index is 16.31 kg/(m^2).  Sickle cell trait  Other Pertinent History  Chronic pain  Recent 100 pound weight loss  Hospital day # 3  SHARON BIBY, MSN, RN, ANVP-BC, ANP-BC, [facility] Pager: [phone] 09/07/2014 11:09 AM   I, the attending vascular neurologist, have personally obtained a history, examined the patient, evaluated laboratory data, individually viewed imaging  studies, and formulated the assessment and plan of care.  I have made any additions or clarifications directly to the above note and agree with the findings and plan as currently documented.   Neurology will sign off. Please call with questions. Pt will follow up with Dr. Erlinda Hong at [facility] in about 2 months. Thanks for the consult.  Rosalin Hawking, MD PhD Stroke Neurology 09/07/2014 10:51 PM    To contact Stroke Continuity provider, please refer to [email or web address] After hours, contact General Neurology

## 2014-09-07 NOTE — ED Provider Notes (Signed)
59 y.o. Female presents with family with difficulty speaking.  LNN at 1300- patient presents at 2200. PE tachycardia Filed Vitals:   09/07/14 0553  BP: 137/80  Pulse: 111  Temp: 98.7 F (37.1 C)  Resp: 18   Patient with perseveration in speech.  Facial asymmetry.  No lateralized extremity deficit noted. eom intact.  Patient is out side of window for tpa.  CT shows low attenuation area from left lower sylvian fissure though putamen and into left caudate head. I have reviewed the report and personally reviewed the above radiology studies.    I saw and evaluated the patient, reviewed the resident's note and I agree with the findings and plan.   EKG Interpretation   Date/Time:  Sunday September 04 2014 22:04:49 EDT Ventricular Rate:  134 PR Interval:  127 QRS Duration: 77 QT Interval:  329 QTC Calculation: 491 R Axis:   44 Text Interpretation:  Sinus tachycardia Atrial premature complex Consider  right atrial enlargement Left ventricular hypertrophy Borderline prolonged  QT interval ED PHYSICIAN INTERPRETATION AVAILABLE IN [facility]  Confirmed by TEST, Record ([id number]) on 09/06/2014 7:08:44 AM        Shaune Pollack, MD 09/07/14 [phone]

## 2014-09-07 NOTE — Consult Note (Signed)
CONSULTATION NOTE  Reason for Consult: Cardiomyopathy  Requesting Physician: Dr. Tyrell Antonio  Cardiologist: None (New)  HPI: This is a 58 y.o. female with a past medical history significant for HTN, DM type 2, sickle cell trait, OA, brought in by family due to new aphasia. CT brain after arrival to the ED showed a wedge of low-attenuation extending from the lower left sylvian fissure through the putamen, anterior limb internal capsule, and into the left caudate head, consistent with acute stroke (09/05/14). Yesterday, I was asked to perform a TEE to evaluate for source of embolism.  No source was identified, however, she was noted to have a new cardiomyopathy with global hypokinesis. Cardiology is now consulted for recommendations regarding her cardiomyopathy.  PMHx:  Past Medical History  Diagnosis Date  . Anxiety     Has been on Xanax since 2009. Uses it for stress, anxiety, and insomnia. No contract yet.  . Depression   . Sickle cell trait   . Diabetes mellitus     Type 2, non insulin dependent. Was dx'd prior to 2008.  Marland Kitchen Hypertension   . Chronic pain     Has OA of knees B. No Xrays in echart. Not requiring narcotics.  . Allergic rhinitis     Requires cetirizine, singulair, and fluticasone.  . Arthritis    Past Surgical History  Procedure Laterality Date  . Tee without cardioversion N/A 09/06/2014    Procedure: TRANSESOPHAGEAL ECHOCARDIOGRAM (TEE);  Surgeon: Pixie Casino, MD;  Location: [facility] ENDOSCOPY;  Service: Cardiovascular;  Laterality: N/A;    FAMHx: Family History  Problem Relation Age of Onset  . Sickle cell anemia Daughter   . Schizophrenia Daughter   . Diabetes Mother   . Heart disease Father   . Cancer Sister   . Cancer Sister     SOCHx:  reports that she has never smoked. She does not have any smokeless tobacco history on file. She reports that she does not drink alcohol or use illicit drugs.  ALLERGIES: No Known Allergies  ROS: Review of  systems not obtained due to patient factors.  HOME MEDICATIONS: Prescriptions prior to admission  Medication Sig Dispense Refill  . alprazolam (XANAX) 2 MG tablet Take 2 mg by mouth 3 (three) times daily as needed for anxiety.      . insulin aspart protamine- aspart (NOVOLOG MIX 70/30) (70-30) 100 UNIT/ML injection Inject into the skin every morning.       . Insulin Glargine (LANTUS SOLOSTAR) 100 UNIT/ML Solostar Pen Inject into the skin every evening.       Marland Kitchen lisinopril (PRINIVIL,ZESTRIL) 10 MG tablet Take 10 mg by mouth daily.      . meloxicam (MOBIC) 15 MG tablet Take 15 mg by mouth 2 (two) times daily as needed for pain.       . metFORMIN (GLUCOPHAGE) 1000 MG tablet Take 1,000 mg by mouth 2 (two) times daily as needed (when insulin is not available).       . metoCLOPramide (REGLAN) 10 MG tablet Take 1 tablet (10 mg total) by mouth 4 (four) times daily -  before meals and at bedtime.  30 tablet  0  . ondansetron (ZOFRAN ODT) 8 MG disintegrating tablet Take 1 tablet (8 mg total) by mouth every 8 (eight) hours as needed for nausea or vomiting.  20 tablet  0  . oxyCODONE-acetaminophen (PERCOCET) 10-325 MG per tablet Take 1 tablet by mouth 3 (three) times daily as needed. For pain  HOSPITAL MEDICATIONS: Scheduled: . aspirin EC  81 mg Oral Daily  . atorvastatin  20 mg Oral q1800  . cefTRIAXone (ROCEPHIN)  IV  1 g Intravenous Q24H  . docusate sodium  100 mg Oral BID  . feeding supplement (GLUCERNA SHAKE)  237 mL Oral BID BM  . heparin  5,000 Units Subcutaneous 3 times per day  . insulin aspart  0-15 Units Subcutaneous TID WC  . insulin aspart  0-5 Units Subcutaneous QHS  . lisinopril  10 mg Oral Daily  . metoCLOPramide  10 mg Oral TID AC & HS    VITALS: Blood pressure 137/80, pulse 111, temperature 98.7 F (37.1 C), temperature source Oral, resp. rate 18, height 5' 6"  (1.676 m), weight 101 lb (45.813 kg), SpO2 98.00%.  PHYSICAL EXAM: General appearance: alert and thin, aphasic  female Neck: no carotid bruit and no JVD Lungs: clear to auscultation bilaterally Heart: regular rate and rhythm, S1, S2 normal, no S3 or S4 and systolic murmur: early systolic 2/6, crescendo at apex Abdomen: soft, non-tender; bowel sounds normal; no masses,  no organomegaly and scaphoid Extremities: extremities normal, atraumatic, no cyanosis or edema Pulses: 2+ and symmetric Skin: Skin color, texture, turgor normal. No rashes or lesions Neurologic: Mental status: Awake, aphasic Psych: Flat affect  LABS: Results for orders placed during the hospital encounter of 09/04/14 (from the past 48 hour(s))  GLUCOSE, CAPILLARY     Status: Abnormal   Collection Time    09/05/14  3:58 PM      Result Value Ref Range   Glucose-Capillary 121 (*) 70 - 99 mg/dL  GLUCOSE, CAPILLARY     Status: Abnormal   Collection Time    09/05/14  9:20 PM      Result Value Ref Range   Glucose-Capillary 125 (*) 70 - 99 mg/dL   Comment 1 Notify RN     Comment 2 Documented in Chart    LIPID PANEL     Status: None   Collection Time    09/05/14 11:08 PM      Result Value Ref Range   Cholesterol 141  0 - 200 mg/dL   Triglycerides 76  <150 mg/dL   HDL 49  >39 mg/dL   Total CHOL/HDL Ratio 2.9     VLDL 15  0 - 40 mg/dL   LDL Cholesterol 77  0 - 99 mg/dL   Comment:            Total Cholesterol/HDL:CHD Risk     Coronary Heart Disease Risk Table                         Men   Women      1/2 Average Risk   3.4   3.3      Average Risk       5.0   4.4      2 X Average Risk   9.6   7.1      3 X Average Risk  23.4   11.0                Use the calculated Patient Ratio     above and the CHD Risk Table     to determine the patient's CHD Risk.                ATP III CLASSIFICATION (LDL):      <100     mg/dL   Optimal      100-129  mg/dL   Near or Above                        Optimal      130-159  mg/dL   Borderline      160-189  mg/dL   High      >190     mg/dL   Very High  GLUCOSE, CAPILLARY     Status: Abnormal     Collection Time    09/06/14  6:41 AM      Result Value Ref Range   Glucose-Capillary 127 (*) 70 - 99 mg/dL  ANTITHROMBIN III     Status: None   Collection Time    09/06/14  8:50 AM      Result Value Ref Range   AntiThromb III Func 95  75 - 120 %  HOMOCYSTEINE     Status: None   Collection Time    09/06/14  8:50 AM      Result Value Ref Range   Homocysteine 7.5  4.0 - 15.4 umol/L   Comment: Performed at [place], IGG, IGM, IGA     Status: Abnormal   Collection Time    09/06/14  8:50 AM      Result Value Ref Range   Anticardiolipin IgG 4 (*) <23 GPL U/mL   Anticardiolipin IgM 1 (*) <11 MPL U/mL   Anticardiolipin IgA 9 (*) <22 APL U/mL   Comment: (NOTE)     Reference Range:  Cardiolipin IgG       Normal                  <23       Low Positive (+)        23-35       Moderate Positive (+)   36-50       High Positive (+)       >50     Reference Range:  Cardiolipin IgM       Normal                  <11       Low Positive (+)        11-20       Moderate Positive (+)   21-30       High Positive (+)       >30     Reference Range:  Cardiolipin IgA       Normal                  <22       Low Positive (+)        22-35       Moderate Positive (+)   36-45       High Positive (+)       >45     Performed at [facility]  HIV ANTIBODY (ROUTINE TESTING)     Status: None   Collection Time    09/06/14  8:50 AM      Result Value Ref Range   HIV 1&2 Ab, 4th Generation NONREACTIVE  NONREACTIVE   Comment: (NOTE)     A NONREACTIVE HIV Ag/Ab result does not exclude HIV infection since     the time frame for seroconversion is variable. If acute HIV infection     is suspected, a HIV-1 RNA Qualitative TMA test is recommended.     HIV-1/2 Antibody Diff  Not indicated.     HIV-1 RNA, Qual TMA           Not indicated.     PLEASE NOTE: This information has been disclosed to you from records     whose confidentiality may be protected by state law.  If your state     requires such protection, then the state law prohibits you from making     any further disclosure of the information without the specific written     consent of the person to whom it pertains, or as otherwise permitted     by law. A general authorization for the release of medical or other     information is NOT sufficient for this purpose.     The performance of this assay has not been clinically validated in     patients less than 2 years old.     Performed at [facility]  RPR     Status: None   Collection Time    09/06/14  8:50 AM      Result Value Ref Range   RPR NON REAC  NON REAC   Comment: Performed at [facility]  MPO/PR-3 (ANCA) ANTIBODIES     Status: None   Collection Time    09/06/14  8:50 AM      Result Value Ref Range   Myeloperoxidase Abs <1.0  <1.0 AI   Comment: (NOTE)                                  Value   Interpretation                                 <1.0 AI: No Antibody Detected                              >or=1.0 AI: Antibody Detected     Autoantibodies to myeloperoxidase (MPO) are commonly     associated with the following small-vessel     vasculitides: microscopic polyangiitis,     polyarteritis nodosa, Churg-Strauss syndrome,     necrotizing and crescentic glomerulonephritis and     occasionally Wegener's granulomatosis. The perinuclear     IFA pattern, (p-ANCA), is based largely on autoantibody     to myeloperoxidase which serves as the primary antigen     These autoantibodies are present in active disease     state.   Serine Protease 3 <1.0  <1.0 AI   Comment: (NOTE)                                  Value   Interpretation                                 <1.0 AI: No Antibody Detected                              >or=1.0 AI: Antibody Detected     Autoantibodies to proteinase-3 (PR-3) are accepted as     characteristic for granulomatosis with polyangiitis     (Wegener's), and are detectable in 95% of the  histologically proven cases. The cytoplasmic IFA     pattern, (c-ANCA), is based largely on autoantibody to     PR-3 which serves as the primary antigen.     These autoantibodies are present in active disease     state.     Performed at [place]     Status: None   Collection Time    09/06/14  8:50 AM      Result Value Ref Range   C3 Complement 99  90 - 180 mg/dL   Comment: Performed at [place]     Status: None   Collection Time    09/06/14  8:50 AM      Result Value Ref Range   Complement C4, Body Fluid 39  10 - 40 mg/dL   Comment: Performed at [place]     Status: None   Collection Time    09/06/14  8:50 AM      Result Value Ref Range   SSA (Ro) (ENA) Antibody, IgG <1.0 NEG  <1.0 NEG AI   Comment: Performed at [place]     Status: None   Collection Time    09/06/14  8:50 AM      Result Value Ref Range   SSB ([place]) (ENA) Antibody, IgG <1.0 NEG  <1.0 NEG AI   Comment: Performed at [place]     Status: None   Collection Time    09/06/14  8:50 AM      Result Value Ref Range   Sed Rate 13  0 - 22 mm/hr  GLUCOSE, CAPILLARY     Status: None   Collection Time    09/06/14  8:52 AM      Result Value Ref Range   Glucose-Capillary 99  70 - 99 mg/dL  GLUCOSE, CAPILLARY     Status: None   Collection Time    09/06/14 11:42 AM      Result Value Ref Range   Glucose-Capillary 94  70 - 99 mg/dL  GLUCOSE, CAPILLARY     Status: Abnormal   Collection Time    09/06/14  4:39 PM      Result Value Ref Range   Glucose-Capillary 160 (*) 70 - 99 mg/dL  GLUCOSE, CAPILLARY     Status: Abnormal   Collection Time    09/06/14  9:06 PM      Result Value Ref Range   Glucose-Capillary 115 (*) 70 - 99 mg/dL   Comment 1 Notify RN     Comment 2 Documented in Chart    GLUCOSE, CAPILLARY      Status: Abnormal   Collection Time    09/07/14  6:33 AM      Result Value Ref Range   Glucose-Capillary 135 (*) 70 - 99 mg/dL   Comment 1 Notify RN     Comment 2 Documented in Chart    GLUCOSE, CAPILLARY     Status: Abnormal   Collection Time    09/07/14 11:46 AM      Result Value Ref Range   Glucose-Capillary 122 (*) 70 - 99 mg/dL  CBC     Status: Abnormal   Collection Time    09/07/14 12:15 PM      Result Value Ref Range   WBC 5.9  4.0 - 10.5 K/uL   RBC 5.47 (*) 3.87 - 5.11  MIL/uL   Hemoglobin 12.0  12.0 - 15.0 g/dL   HCT 35.5 (*) 36.0 - 46.0 %   MCV 64.9 (*) 78.0 - 100.0 fL   MCH 21.9 (*) 26.0 - 34.0 pg   MCHC 33.8  30.0 - 36.0 g/dL   RDW 14.7  11.5 - 15.5 %   Platelets 312  150 - 400 K/uL    IMAGING: Ct Chest W Contrast  09/07/2014   CLINICAL DATA:  Unintentional weight loss.  Acute left CVA.  EXAM: CT CHEST, ABDOMEN, AND PELVIS WITH CONTRAST  TECHNIQUE: Multidetector CT imaging of the chest, abdomen and pelvis was performed following the standard protocol during bolus administration of intravenous contrast.  CONTRAST:  80 cc Omnipaque 300  COMPARISON:  09/04/2014 chest radiograph  FINDINGS: CT CHEST FINDINGS  Right lower paratracheal lymph node upper normal sized at 9 mm. No additional adenopathy in the chest.  Minimal biapical pleural parenchymal scarring. The lungs appear otherwise clear.  CT ABDOMEN AND PELVIS FINDINGS  Hepatobiliary: Punctate 4 mm calcification in segment 2 of the liver, without surrounding abnormal enhancement. Central common bile duct appears dilated at 0.9 cm, with subtle intrahepatic biliary dilatation. Difficult to differentiate the CBD and proximal pancreatic duct as they approach the ampulla ; there may be a small stenotic segment of the distal CBD or the distal CBD may be completely tapered with a dilated segment of the dorsal pancreatic duct in the ampulla.  Pancreas: Possible dilated segment of the dorsal pancreatic duct near the ampulla as noted  above. No differential enhancing mass in the pancreatic head or elsewhere in the pancreas.  Spleen: Intact  Adrenals/Urinary Tract: Abnormal peripheral regions of hypoenhancement scattered in the parenchyma of both kidneys, without enhancing capsular margins. No hydronephrosis. Distended urinary bladder, bladder a volume currently 1 L.  Stomach/Bowel: Unremarkable  Vascular/Lymphatic: Unremarkable  Reproductive: Unremarkable  Other: No supplemental non-categorized findings.  Musculoskeletal: Unremarkable  IMPRESSION: 1. Bilateral regions of abnormal low-density in both kidneys, without cortical rim sign. Appearance favors bilateral pyelonephritis. There may be incipient abscess formation in the left mid to upper kidney on image 11 of series 7. Renal infarcts right less likely differential diagnostic consideration given the appearance. 2. Mildly dilated CBD with an unusual appearance distally where there is narrowing of a segment of the CBD and either subsequent distal dilatation or a small dilated segment of the dorsal pancreatic duct. No differential enhancement in the pancreatic tissue is observed to suggest a neoplasm, although today's exam was protocol does a standard CT abdomen over rather than a pancreatic protocol exam. 3. Distended urinary bladder, significance uncertain, possibly simply incidental. 4. No worrisome findings in the chest.   Electronically Signed   By: Sherryl Barters M.D.   On: 09/07/2014 07:48   Ct Abdomen Pelvis W Contrast  09/07/2014   CLINICAL DATA:  Unintentional weight loss.  Acute left CVA.  EXAM: CT CHEST, ABDOMEN, AND PELVIS WITH CONTRAST  TECHNIQUE: Multidetector CT imaging of the chest, abdomen and pelvis was performed following the standard protocol during bolus administration of intravenous contrast.  CONTRAST:  80 cc Omnipaque 300  COMPARISON:  09/04/2014 chest radiograph  FINDINGS: CT CHEST FINDINGS  Right lower paratracheal lymph node upper normal sized at 9 mm. No  additional adenopathy in the chest.  Minimal biapical pleural parenchymal scarring. The lungs appear otherwise clear.  CT ABDOMEN AND PELVIS FINDINGS  Hepatobiliary: Punctate 4 mm calcification in segment 2 of the liver, without surrounding abnormal enhancement. Central common bile duct  appears dilated at 0.9 cm, with subtle intrahepatic biliary dilatation. Difficult to differentiate the CBD and proximal pancreatic duct as they approach the ampulla ; there may be a small stenotic segment of the distal CBD or the distal CBD may be completely tapered with a dilated segment of the dorsal pancreatic duct in the ampulla.  Pancreas: Possible dilated segment of the dorsal pancreatic duct near the ampulla as noted above. No differential enhancing mass in the pancreatic head or elsewhere in the pancreas.  Spleen: Intact  Adrenals/Urinary Tract: Abnormal peripheral regions of hypoenhancement scattered in the parenchyma of both kidneys, without enhancing capsular margins. No hydronephrosis. Distended urinary bladder, bladder a volume currently 1 L.  Stomach/Bowel: Unremarkable  Vascular/Lymphatic: Unremarkable  Reproductive: Unremarkable  Other: No supplemental non-categorized findings.  Musculoskeletal: Unremarkable  IMPRESSION: 1. Bilateral regions of abnormal low-density in both kidneys, without cortical rim sign. Appearance favors bilateral pyelonephritis. There may be incipient abscess formation in the left mid to upper kidney on image 11 of series 7. Renal infarcts right less likely differential diagnostic consideration given the appearance. 2. Mildly dilated CBD with an unusual appearance distally where there is narrowing of a segment of the CBD and either subsequent distal dilatation or a small dilated segment of the dorsal pancreatic duct. No differential enhancement in the pancreatic tissue is observed to suggest a neoplasm, although today's exam was protocol does a standard CT abdomen over rather than a pancreatic  protocol exam. 3. Distended urinary bladder, significance uncertain, possibly simply incidental. 4. No worrisome findings in the chest.   Electronically Signed   By: Sherryl Barters M.D.   On: 09/07/2014 07:48    HOSPITAL DIAGNOSES: Principal Problem:   CVA (cerebral infarction) Active Problems:   SICKLE CELL TRAIT   Anxiety state   Diabetes mellitus   HTN (hypertension)   Secondary cardiomyopathy   IMPRESSION: 1. Newly diagnosed cardiomyopathy - EF 30-35%, global hypokinesis 2. Stroke 3. DM2 4. Uncontrolled HTN  RECOMMENDATION: 1. Ms. Grogan has a newly diagnosed cardiomyopathy with global hypokinesis which could be hypertensive cardiomyopathy or given her risk factors be due to multivessel CAD. She will need an ischemia evaluation. She was active and functional prior to her stroke - he main limitation now is aphasia, but no significant motor problems. Unable to assess whether she has had chest pain symptoms. Her daughter said she had not complained of anything prior to her stroke. Given the recent stroke, catheterization is relatively contraindicated. I would recommend a lexiscan NST tomorrow to evaluate for significant ischemia. She is on aspirin, statin, and ACE-I.  Her persistent tachycardia is likely due to a hyperadrenergic state - would add carvedilol 6.25 mg BID as well.  Please keep NPO p MN for stress test tomorrow.  Thanks for consulting [place]. We will follow along with you.  Time Spent Directly with Patient: 30 minutes  Pixie Casino, MD, [facility] Attending Cardiologist [facility]  Teia Freitas C 09/07/2014, 2:34 PM

## 2014-09-07 NOTE — Progress Notes (Addendum)
[facility] PROGRESS NOTE  Kristen Lozano:[id number] DOB: 11-Sep-1956 DOA: 09/04/2014 PCP: Elyn Peers, MD   Brief narrative 58 y.o. female with past medical history of hypertension, depression,  diabetes mellitus with  Possible gastroparesis  presented with complaints of aphasia. She was last seen and normal about 6 hours prior to being found by daughter to be non verbal. As per daughter,  Patient was confused and responding to every question with "yes".  EMS  noticed a facial droopon the right. Head CT on admission showing a left perforator infarct. Admitted for further stroke w/up. Patient was out of therapeutic tPA window so did not receive tPA.   Assessment/Plan: Acute left sided stroke  -CT head shows left perforator infarct involving large area of left MCA distrubution. - MRI brain, MRA head shows diffuse left sided infarct as outlined below. -2D echo shows EF of 35-40% with global hypokinesis , possible recent ischemic heart disease. -carotid dopplers with non significant stenosis. -PT/OT eval recommends HHPT, SNF ?  -continue daily aspirin. LDL of 75. On statin -hypercoagulable and vasculitis w/up sent : ESR normal, SSA negative, Myeloperoxide antibodies negative, ANCA negative, RPR non reactive, HIV non reactive, anticardiolipin low, antithrombin III negative, Factor V leiden, lupus anticoagulant, Protein S, C  , B 2 globulin, Complement pending.  -Neuro recommending coumadin to be started in 2 to 3 days. Family wants opinion from cardiologist.   Abnormal renal finding on CT scan:  -Bilateral regions of abnormal low-density in both kidneys, without cortical rim sign. Appearance favors bilateral  pyelonephritis. There may be incipient abscess formation in the left mid to upper kidney on image 11 of series 7. Renal infarcts right less likely differential diagnostic consideration given the appearance. -Check UA, urine culture.  -bladder distension on CT. Foley placed.   -Urology consulted.  -Start Ceftriaxone.  -per husband she was diagnosed with UTI last week.   Mildly dilated CBD; LFT normal.   DM type 2 Monitor on SSI.  A1C of 6.2  Cardiomyopathy  possibly ischemic Cardiology consulted per neurologist request.  Started on coreg and lisinopril.  Follow cardiology recommendation regarding anticoagulation.   HTN  stable  malnutrition  suppleemnts  Code Status: FULL  Family Communication: Husband, daughter   at bedside Disposition Plan: possible d/c home in 1-2 days   Consultants:  Stroke team   urology  Procedures:  MRI brain  2D echo carotid doppler  Antibiotics:  Ceftriaxone 10-14  HPI/Subjective: Patient with word finding difficulty.  She relates feeling ok.  Her husband is concern regarding starting coumadin, he think she wont take the medications.    Objective: Filed Vitals:   09/07/14 0553  BP: 137/80  Pulse: 111  Temp: 98.7 F (37.1 C)  Resp: 18    Intake/Output Summary (Last 24 hours) at 09/07/14 1147 Last data filed at 09/06/14 1700  Gross per 24 hour  Intake    360 ml  Output      0 ml  Net    360 ml   Filed Weights   09/05/14 0204  Weight: 45.813 kg (101 lb)    Exam:   General: NAD,   HEETN: rt facial. Droop,  moist mucosa  Cardiovascular: S1&S2 normal  no muurmurs  Respiratory: clear b/l  Abdomen: soft, NT, ND,   Musculoskeletal:warm, no edema  CNS: alert and awake, oriented 1x2, has word finding difficulty, rt facial droop. Good strength b/l   Data Reviewed: Basic Metabolic Panel:  Recent Labs Lab 09/04/14 2240 09/05/14 0705  NA 139 137  K 4.0 4.0  CL 98 102  CO2 25 22  GLUCOSE 126* 105*  BUN 12 11  CREATININE 0.60 0.54  CALCIUM 10.0 9.2   Liver Function Tests:  Recent Labs Lab 09/04/14 2240 09/05/14 0705  AST 15 14  ALT 8 7  ALKPHOS 49 44  BILITOT 0.8 0.8  PROT 8.0 7.0  ALBUMIN 3.5 3.1*   No results found for this basename: LIPASE, AMYLASE,  in the  last 168 hours  Recent Labs Lab 09/04/14 2240  AMMONIA 20   CBC:  Recent Labs Lab 09/04/14 2240 09/05/14 0705  WBC 7.6 6.1  NEUTROABS 4.3 3.4  HGB 13.0 12.1  HCT 39.5 35.3*  MCV 66.7* 64.8*  PLT 350 295   Cardiac Enzymes: No results found for this basename: CKTOTAL, CKMB, CKMBINDEX, TROPONINI,  in the last 168 hours BNP (last 3 results) No results found for this basename: PROBNP,  in the last 8760 hours CBG:  Recent Labs Lab 09/06/14 0852 09/06/14 1142 09/06/14 1639 09/06/14 2106 09/07/14 0633  GLUCAP 99 94 160* 115* 135*    No results found for this or any previous visit (from the past 240 hour(s)).   Studies: Ct Chest W Contrast  09/07/2014   CLINICAL DATA:  Unintentional weight loss.  Acute left CVA.  EXAM: CT CHEST, ABDOMEN, AND PELVIS WITH CONTRAST  TECHNIQUE: Multidetector CT imaging of the chest, abdomen and pelvis was performed following the standard protocol during bolus administration of intravenous contrast.  CONTRAST:  80 cc Omnipaque 300  COMPARISON:  09/04/2014 chest radiograph  FINDINGS: CT CHEST FINDINGS  Right lower paratracheal lymph node upper normal sized at 9 mm. No additional adenopathy in the chest.  Minimal biapical pleural parenchymal scarring. The lungs appear otherwise clear.  CT ABDOMEN AND PELVIS FINDINGS  Hepatobiliary: Punctate 4 mm calcification in segment 2 of the liver, without surrounding abnormal enhancement. Central common bile duct appears dilated at 0.9 cm, with subtle intrahepatic biliary dilatation. Difficult to differentiate the CBD and proximal pancreatic duct as they approach the ampulla ; there may be a small stenotic segment of the distal CBD or the distal CBD may be completely tapered with a dilated segment of the dorsal pancreatic duct in the ampulla.  Pancreas: Possible dilated segment of the dorsal pancreatic duct near the ampulla as noted above. No differential enhancing mass in the pancreatic head or elsewhere in the  pancreas.  Spleen: Intact  Adrenals/Urinary Tract: Abnormal peripheral regions of hypoenhancement scattered in the parenchyma of both kidneys, without enhancing capsular margins. No hydronephrosis. Distended urinary bladder, bladder a volume currently 1 L.  Stomach/Bowel: Unremarkable  Vascular/Lymphatic: Unremarkable  Reproductive: Unremarkable  Other: No supplemental non-categorized findings.  Musculoskeletal: Unremarkable  IMPRESSION: 1. Bilateral regions of abnormal low-density in both kidneys, without cortical rim sign. Appearance favors bilateral pyelonephritis. There may be incipient abscess formation in the left mid to upper kidney on image 11 of series 7. Renal infarcts right less likely differential diagnostic consideration given the appearance. 2. Mildly dilated CBD with an unusual appearance distally where there is narrowing of a segment of the CBD and either subsequent distal dilatation or a small dilated segment of the dorsal pancreatic duct. No differential enhancement in the pancreatic tissue is observed to suggest a neoplasm, although today's exam was protocol does a standard CT abdomen over rather than a pancreatic protocol exam. 3. Distended urinary bladder, significance uncertain, possibly simply incidental. 4. No worrisome findings in the chest.   Electronically  Signed   By: Sherryl Barters M.D.   On: 09/07/2014 07:48   Ct Abdomen Pelvis W Contrast  09/07/2014   CLINICAL DATA:  Unintentional weight loss.  Acute left CVA.  EXAM: CT CHEST, ABDOMEN, AND PELVIS WITH CONTRAST  TECHNIQUE: Multidetector CT imaging of the chest, abdomen and pelvis was performed following the standard protocol during bolus administration of intravenous contrast.  CONTRAST:  80 cc Omnipaque 300  COMPARISON:  09/04/2014 chest radiograph  FINDINGS: CT CHEST FINDINGS  Right lower paratracheal lymph node upper normal sized at 9 mm. No additional adenopathy in the chest.  Minimal biapical pleural parenchymal scarring. The  lungs appear otherwise clear.  CT ABDOMEN AND PELVIS FINDINGS  Hepatobiliary: Punctate 4 mm calcification in segment 2 of the liver, without surrounding abnormal enhancement. Central common bile duct appears dilated at 0.9 cm, with subtle intrahepatic biliary dilatation. Difficult to differentiate the CBD and proximal pancreatic duct as they approach the ampulla ; there may be a small stenotic segment of the distal CBD or the distal CBD may be completely tapered with a dilated segment of the dorsal pancreatic duct in the ampulla.  Pancreas: Possible dilated segment of the dorsal pancreatic duct near the ampulla as noted above. No differential enhancing mass in the pancreatic head or elsewhere in the pancreas.  Spleen: Intact  Adrenals/Urinary Tract: Abnormal peripheral regions of hypoenhancement scattered in the parenchyma of both kidneys, without enhancing capsular margins. No hydronephrosis. Distended urinary bladder, bladder a volume currently 1 L.  Stomach/Bowel: Unremarkable  Vascular/Lymphatic: Unremarkable  Reproductive: Unremarkable  Other: No supplemental non-categorized findings.  Musculoskeletal: Unremarkable  IMPRESSION: 1. Bilateral regions of abnormal low-density in both kidneys, without cortical rim sign. Appearance favors bilateral pyelonephritis. There may be incipient abscess formation in the left mid to upper kidney on image 11 of series 7. Renal infarcts right less likely differential diagnostic consideration given the appearance. 2. Mildly dilated CBD with an unusual appearance distally where there is narrowing of a segment of the CBD and either subsequent distal dilatation or a small dilated segment of the dorsal pancreatic duct. No differential enhancement in the pancreatic tissue is observed to suggest a neoplasm, although today's exam was protocol does a standard CT abdomen over rather than a pancreatic protocol exam. 3. Distended urinary bladder, significance uncertain, possibly simply  incidental. 4. No worrisome findings in the chest.   Electronically Signed   By: Sherryl Barters M.D.   On: 09/07/2014 07:48    Scheduled Meds: . aspirin EC  81 mg Oral Daily  . atorvastatin  20 mg Oral q1800  . feeding supplement (GLUCERNA SHAKE)  237 mL Oral BID BM  . heparin  5,000 Units Subcutaneous 3 times per day  . insulin aspart  0-15 Units Subcutaneous TID WC  . insulin aspart  0-5 Units Subcutaneous QHS  . lisinopril  10 mg Oral Daily  . metoCLOPramide  10 mg Oral TID AC & HS   Continuous Infusions: . sodium chloride 1,000 mL (09/05/14 0317)      Time spent: 25 minutes    Merryl Buckels, [facility] Pager [phone]. If 7PM-7AM, please contact night-coverage at [email or web address], password [facility] 09/07/2014, 11:47 AM  LOS: 3 days

## 2014-09-07 NOTE — Progress Notes (Signed)
Physical Therapy Treatment Patient Details Name: Kristen Lozano MRN: [id number] DOB: 03-22-1956 Today's Date: 09/07/2014    History of Present Illness 58 y.o. female with PMH significant for hypertension, depression, diabetes mellitus admitted with confusion, language impairment. Imaging positive for CVA. 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     PT Comments    Pt progressing with mobility but updated d/c recommendation to CIR because of safety issues and balance deficits when challenged. Pt requiring min A to prevent fall when balance challenged during ambulation as well as being very distracted by her environment and decreased awareness of safety and deficits. Pt would greatly benefit from collaborative treatment that PT/OT/SLP can provide in an inpt rehab environment. PT will continue to follow.   Follow Up Recommendations  CIR     Equipment Recommendations  None recommended by PT    Recommendations for Other Services Rehab consult     Precautions / Restrictions Precautions Precautions: Fall Restrictions Weight Bearing Restrictions: No    Mobility  Bed Mobility Overal bed mobility: Needs Assistance Bed Mobility: Supine to Sit;Sit to Supine     Supine to sit: Supervision Sit to supine: Min assist   General bed mobility comments: pt able to come to sitting with supervision but quickly began to lie back down. Hand gestures needed to get pt to stay up to walk. With return to bed, pt able to get self into bed but laying on very edge and not following instructions, difficulty scooting to get into middle of bed. Min A given to move hips to center, bed alarm set  Transfers Overall transfer level: Needs assistance Equipment used: None Transfers: Sit to/from Stand Sit to Stand: Min  guard         General transfer comment: min-guard for safety, no LOB with standing  Ambulation/Gait Ambulation/Gait assistance: Min guard;Min assist Ambulation Distance (Feet): 200 Feet Assistive device: None Gait Pattern/deviations: Staggering left;Staggering right;Drifts right/left;Narrow base of support Gait velocity: WFL   General Gait Details: pt staggers left and right with head turns, easily distracted by environment. When pt attending to environment, ambulates with min-guard A but staggering resulted in need for intermittent min A to prevent fall. Worked on quick starting and stopping but pt having difficulty following instructions to complete task appropriately.    Stairs            Wheelchair Mobility    Modified Rankin (Stroke Patients Only) Modified Rankin (Stroke Patients Only) Pre-Morbid Rankin Score: No symptoms Modified Rankin: Moderately severe disability     Balance Overall balance assessment: Needs assistance Sitting-balance support: No upper extremity supported;Feet supported Sitting balance-Leahy Scale: Good     Standing balance support: No upper extremity supported;During functional activity Standing balance-Leahy Scale: Fair Standing balance comment: worked on standing balance activities with and without support. Again, pt shows decreased awareness to self and safety and min A needed at times.                     Cognition Arousal/Alertness: Awake/alert Behavior During Therapy: Flat affect Overall Cognitive Status: Impaired/Different from baseline Area of Impairment: Following commands;Safety/judgement       Following Commands: Follows one step commands inconsistently;Follows multi-step commands inconsistently Safety/Judgement: Decreased awareness of safety;Decreased awareness of deficits     General Comments: pt inconsistently follows commands, obvious receptive issues as well as expressive.  This along with decreased awareness  significantly limits safety with mobility    Exercises      General Comments        Pertinent Vitals/Pain Pain Assessment: No/denies pain    Home Living                      Prior Function            PT Goals (current goals can now be found in the care plan section) Acute Rehab PT Goals Patient Stated Goal: none stated PT Goal Formulation: With family Time For Goal Achievement: 09/20/14 Potential to Achieve Goals: Good Progress towards PT goals: Progressing toward goals    Frequency  Min 4X/week    PT Plan Discharge plan needs to be updated    Co-evaluation             End of Session Equipment Utilized During Treatment: Gait belt Activity Tolerance: Patient tolerated treatment well Patient left: in bed;with bed alarm set;with family/visitor present;with call bell/phone within reach     Time: 2831-5176 PT Time Calculation (min): 18 min  Charges:  $Gait Training: 8-22 mins                    G Codes:     Leighton Roach, [place]  [phone]  Leighton Roach 09/07/2014, 3:57 PM

## 2014-09-07 NOTE — Progress Notes (Signed)
Occupational Therapy Evaluation Patient Details Name: Kristen Lozano MRN: [id number] DOB: [birth year] Today's Date: 09/07/2014    History of Present Illness 58 y.o. female with PMH significant for hypertension, depression, diabetes mellitus admitted with confusion, language impairment. Imaging positive for CVA. 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    Clinical Impression   PT admitted with an acute nonhemmorrhagic infarct involving posterior L temporal and parietal lobe potentially affecting Wernicke's area . Pt currently with functional limitiations due to the deficits listed below (see OT problem list). Pt presents with global aphasia, but daughter claims that pt's communication deficits are attention seeking behaviors although pt demonstrates receptive and expressive language deficits. During adl tasks, pt demonstrates difficulty with initiating and sequencing. Pt will benefit from skilled OT to increase independence and safety with adls to allow discharge to SNF.    Follow Up Recommendations  SNF    Equipment Recommendations  None recommended by OT    Recommendations for Other Services       Precautions / Restrictions Precautions Precautions: Fall Restrictions Weight Bearing Restrictions: No      Mobility Bed Mobility Overal bed mobility: Needs Assistance Bed Mobility: Supine to Sit     Supine to sit: Min assist     General bed mobility comments: Needed min (A) to initiate moving from laying to sitting EOB  Transfers Overall transfer level: Needs assistance Equipment used: None   Sit to Stand: Min assist         General transfer comment: Pt needed verbal and tactile cues to initiate movement from sit to stand.    Balance     Sitting balance-Leahy Scale: Good        Standing balance-Leahy Scale: Fair                              ADL Overall ADL's : Needs assistance/impaired     Grooming: Wash/dry hands;Wash/dry face;Minimal assistance;Cueing for sequencing;Standing Grooming Details (indicate cue type and reason): Pt needed verbal and visual cues to initiate and complete sequence of ADL tasks.                 Toilet Transfer: Supervision/safety;Regular Museum/gallery exhibitions officer and Hygiene: Minimal assistance;Cueing for sequencing;Sit to/from stand Toileting - Clothing Manipulation Details (indicate cue type and reason): Pt stood up and walked to sink before pulling up pants completely. Pt needed mod cues to pull up pants and button them.     Functional mobility during ADLs: Supervision/safety General ADL Comments: Pt required verbal and tactile cues to initiate and complete sequence of adl tasks. Pt required manual assist to initiate hand washing and face washing and mod verbal cues to complete sequence of both tasks.      Vision                     Perception Perception Perception Tested?: Yes Perception Deficits: Object recognition   Praxis Praxis Praxis tested?: Deficits Deficits: Perseveration    Pertinent Vitals/Pain Faces Pain Scale: No hurt     Hand Dominance     Extremity/Trunk Assessment         Cervical / Trunk Assessment Cervical / Trunk Assessment: Normal   Communication Communication Communication: Receptive difficulties;Expressive difficulties   Cognition Arousal/Alertness: Awake/alert Behavior During Therapy: WFL for  tasks assessed/performed Overall Cognitive Status: Difficult to assess (aphasia)                     General Comments       Exercises       Shoulder Instructions      Home Living Family/patient expects to be discharged to:: Private residence Living Arrangements: Alone Available Help at Discharge: Family Type of Home: House        Home Layout: One level     Bathroom Shower/Tub: Teacher, early years/pre: Standard     Home Equipment: None          Prior Functioning/Environment Level of Independence: Independent        Comments: Family would come to help with household tasks    OT Diagnosis: Generalized weakness;Cognitive deficits;Other (comment) (global aphasia)   OT Problem List: Decreased cognition;Decreased safety awareness   OT Treatment/Interventions: Self-care/ADL training;Therapeutic activities;Cognitive remediation/compensation;Patient/family education    OT Goals(Current goals can be found in the care plan section) Acute Rehab OT Goals Patient Stated Goal: none stated Time For Goal Achievement: 09/21/14 Potential to Achieve Goals: Good ADL Goals Pt Will Perform Grooming: standing;with supervision Pt Will Perform Upper Body Bathing: with supervision;standing Pt Will Perform Upper Body Dressing: with supervision;standing  OT Frequency: Min 2X/week   Barriers to D/C:            Co-evaluation              End of Session Equipment Utilized During Treatment: Gait belt Nurse Communication: Mobility status;Precautions  Activity Tolerance: Patient tolerated treatment well Patient left: in bed;with call bell/phone within reach;with family/visitor present   Time: 1448-1856 OT Time Calculation (min): 24 min Charges:  OT Evaluation $Initial OT Evaluation Tier I: 1 Procedure OT Treatments $Self Care/Home Management : 8-22 mins G-Codes:    Redmond Baseman Sep 15, 2014, 2:59 PM

## 2014-09-07 NOTE — Progress Notes (Signed)
Thank you for consult on Kristen Lozano. PT evaluation done and patient is min/guard assist. Patient with language deficits due to global aphasia. Would recommend HHST in addition to HHPT recommended for follow up. Will defer CIR consult for now.

## 2014-09-07 NOTE — Evaluation (Signed)
Speech Language Pathology Evaluation Patient Details Name: Kristen Lozano MRN: [id number] DOB: 10-04-1956 Today's Date: 09/07/2014 Time: 0910-0929 SLP Time Calculation (min): 19 min  Problem List:  Patient Active Problem List   Diagnosis Date Noted  . 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 Stroke 09/05/2014  . CVA (cerebral infarction) 09/05/2014  . Non compliance w medication regimen 08/10/2012  . Fatigue 02/18/2012  . Diabetes mellitus 03/22/2011  . HTN (hypertension) 03/22/2011  . Healthcare maintenance 03/22/2011  . Chronic pain   . ALLERGIC RHINITIS, SEASONAL 03/14/2008  . SICKLE CELL TRAIT 10/31/2006  . Anxiety state 10/31/2006  . DEPRESSION 10/31/2006   Past Medical History:  Past Medical History  Diagnosis Date  . Anxiety     Has been on Xanax since 2009. Uses it for stress, anxiety, and insomnia. No contract yet.  . Depression   . Sickle cell trait   . Diabetes mellitus     Type 2, non insulin dependent. Was dx'd prior to 2008.  Marland Kitchen Hypertension   . Chronic pain     Has OA of knees B. No Xrays in echart. Not requiring narcotics.  . Allergic rhinitis     Requires cetirizine, singulair, and fluticasone.  . Arthritis    Past Surgical History:  Past Surgical History  Procedure Laterality Date  . Tee without cardioversion N/A 09/06/2014    Procedure: TRANSESOPHAGEAL ECHOCARDIOGRAM (TEE);  Surgeon: Pixie Casino, MD;  Location: [facility] ENDOSCOPY;  Service: Cardiovascular;  Laterality: N/A;   HPI:  Kristen Lozano is a 58 y.o. female with PMH of hypertension, depression, diabetes mellitus admitted with confusion, language impairment. MRI showed Acute nonhemorrhagic infarct involving the anterior left lentiform nucleus and caudate head as well as acute nonhemmorrhagic infarct involving posterior L temporal and parietal lobe.   Assessment / Plan / Recommendation Clinical Impression  Pt presents with a global aphasia, with spontaneous verbal output limited to her name. Pt  with intermittent abilities for one-word repetition, then with perseverative errors. Pt requires Max cues for one-step commands due to both impaired comprehension as well as perseveration. SLP provided Max cues for attempts at automatic speech tasks, with patient then able to count from 1-12. Her sustained attention is reduced with decreased emergent awareness of linguistic errors. Given a binary choice presented verbally by SLP, she was able to correctly determine where she was and why she was there. At this time patient has severely impaired cognitive-linguistic function which impacts her functional communication across modalities (verbal, writing, reading), decreasing her ability to convey basic wants/needs. Pt will therefore need skilled SLP services.     SLP Assessment  Patient needs continued Speech Lanaguage Pathology Services    Follow Up Recommendations  Inpatient Rehab;24 hour supervision/assistance    Frequency and Duration min 2x/week  2 weeks   Pertinent Vitals/Pain Pain Assessment: Faces Faces Pain Scale: No hurt   SLP Goals  Patient/Family Stated Goal: pt unable to state, family asleep throughout eval Potential to Achieve Goals: Fair Potential Considerations: Previous level of function;Severity of impairments;Family/community support;Other (comment) (family asleep, unable to determine PLOF/support level)  SLP Evaluation Prior Functioning  Cognitive/Linguistic Baseline: Information not available   Cognition  Overall Cognitive Status: Difficult to assess (aphasia) Arousal/Alertness: Awake/alert Orientation Level: Oriented to person;Other (comment);Oriented to place;Oriented to situation (oriented to location and situation given choice of 2) Attention: Sustained Sustained Attention: Impaired Sustained Attention Impairment: Verbal basic;Functional basic Awareness: Impaired Awareness Impairment: Emergent impairment (does not seem aware of linguistic errors) Behaviors:  Perseveration Safety/Judgment: Impaired  Comprehension  Auditory Comprehension Overall Auditory Comprehension: Impaired Yes/No Questions: Impaired Basic Biographical Questions: 0-25% accurate Basic Immediate Environment Questions: 0-24% accurate Commands: Impaired One Step Basic Commands: 25-49% accurate Interfering Components: Attention Visual Recognition/Discrimination Discrimination: Exceptions to [facility] Common Objects: Unable to indentify Reading Comprehension Reading Status: Impaired Word level: Impaired    Expression Expression Primary Mode of Expression: Nonverbal - gestures Verbal Expression Overall Verbal Expression: Impaired Initiation: Impaired Automatic Speech: Name;Counting;Social Response (counted 1-9, repeated "Sunday") Level of Generative/Spontaneous Verbalization: Word Repetition: Impaired Level of Impairment: Word level Naming: Impairment Responsive: 0-25% accurate Confrontation: Impaired Common Objects: Unable to indentify Verbal Errors: Perseveration;Not aware of errors Interfering Components: Attention Non-Verbal Means of Communication: Gestures (head nods, although inaccurate) Written Expression Written Expression: Exceptions to [facility] Dictation Ability:  (name) Self Formulation Ability:  (name)   Oral / Motor Motor Speech Overall Motor Speech: Appears within functional limits for tasks assessed (during limited assessment due to limited verbal output)   GO      Kristen Lozano, M.A. CCC-SLP [phone]  Kristen Lozano 09/07/2014, 9:53 AM

## 2014-09-07 NOTE — Progress Notes (Signed)
Rehab Admissions Coordinator Note:  Patient was screened by Sani Madariaga L for appropriateness for an Inpatient Acute Rehab Consult. PT now recommending CIR and noted pt's activity is min to min guard with gait, though pt has balance and safety concerns.  At this time, we are recommending Inpatient Rehab consult.  Christyn Gutkowski L 09/07/2014, 4:52 PM  I can be reached at [phone].

## 2014-09-07 NOTE — Consult Note (Signed)
Urology Consult   Physician requesting consult: Regalado  Reason for consult: Possible pyelonephritis and AUR  History of Present Illness: Kristen Lozano is a 58 y.o. AA female with PMH significant for DM II, sickle cell trait, HTN, anxiety, and arthritis who was admitted 09/04/14 for eval and treatment of an acute left sided CVA.  She is being treated/followed by neurology, cardiology, and IM.   As part of the workup to identify an embolic source a Pan CT was performed.  The CT A/P revealed: bilateral regions of abnormal low density in the kidneys without a cortical rim sign with an appearance that favors bilateral pyelonephritis, possible incipient abscess left upper kidney, possible renal infarcts right kidney, and a distended urinary bladder.  UA on 09/05/14 was normal, renal function is normal, pt is AF, and denies back/abdominal pain.  Per RN, output not being measured/monitored.  PVR has been ordered but not yet performed.  Pt has been going to the bathroom without assist.  UA has been ordered for today but not yet collected.  Rocephin is ordered to be started after UA collection.    Pt is still with aphasia and confusion.  She is unable to provide adequate information regarding her GU hx.  She will answer yes/no questions but will contradict herself with additional questions.  She is also unable to tell me when she last had a BM or if she has issues with constipation.  She was evaled in the ED on 08/11/14 for c/o N/V and weight loss.  Clean catch UA in ED was nitrite negative with many bacteria, small leukocytes, and no RBCs.  Probable contamination but no culture was performed.  Family states she was given an ABx at that visit but did not take it. Daughters are unable to provide any additional information regarding GI/GU hx.    Past Medical History  Diagnosis Date  . Anxiety     Has been on Xanax since 2009. Uses it for stress, anxiety, and insomnia. No contract yet.  . Depression   .  Sickle cell trait   . Diabetes mellitus     Type 2, non insulin dependent. Was dx'd prior to 2008.  Marland Kitchen Hypertension   . Chronic pain     Has OA of knees B. No Xrays in echart. Not requiring narcotics.  . Allergic rhinitis     Requires cetirizine, singulair, and fluticasone.  . Arthritis     Past Surgical History  Procedure Laterality Date  . Tee without cardioversion N/A 09/06/2014    Procedure: TRANSESOPHAGEAL ECHOCARDIOGRAM (TEE);  Surgeon: Pixie Casino, MD;  Location: [facility] ENDOSCOPY;  Service: Cardiovascular;  Laterality: N/A;     Current Hospital Medications:  Home meds:    Medication List    ASK your doctor about these medications       alprazolam 2 MG tablet  Commonly known as:  XANAX  Take 2 mg by mouth 3 (three) times daily as needed for anxiety.     LANTUS SOLOSTAR 100 UNIT/ML Solostar Pen  Generic drug:  Insulin Glargine  Inject into the skin every evening.     lisinopril 10 MG tablet  Commonly known as:  PRINIVIL,ZESTRIL  Take 10 mg by mouth daily.     meloxicam 15 MG tablet  Commonly known as:  MOBIC  Take 15 mg by mouth 2 (two) times daily as needed for pain.     metFORMIN 1000 MG tablet  Commonly known as:  GLUCOPHAGE  Take 1,000 mg by  mouth 2 (two) times daily as needed (when insulin is not available).     metoCLOPramide 10 MG tablet  Commonly known as:  REGLAN  Take 1 tablet (10 mg total) by mouth 4 (four) times daily -  before meals and at bedtime.     NOVOLOG MIX 70/30 (70-30) 100 UNIT/ML injection  Generic drug:  insulin aspart protamine- aspart  Inject into the skin every morning.     ondansetron 8 MG disintegrating tablet  Commonly known as:  ZOFRAN ODT  Take 1 tablet (8 mg total) by mouth every 8 (eight) hours as needed for nausea or vomiting.     oxyCODONE-acetaminophen 10-325 MG per tablet  Commonly known as:  PERCOCET  Take 1 tablet by mouth 3 (three) times daily as needed. For pain        Scheduled Meds: . aspirin EC  81 mg  Oral Daily  . atorvastatin  20 mg Oral q1800  . cefTRIAXone (ROCEPHIN)  IV  1 g Intravenous Q24H  . docusate sodium  100 mg Oral BID  . feeding supplement (GLUCERNA SHAKE)  237 mL Oral BID BM  . heparin  5,000 Units Subcutaneous 3 times per day  . insulin aspart  0-15 Units Subcutaneous TID WC  . insulin aspart  0-5 Units Subcutaneous QHS  . lisinopril  10 mg Oral Daily  . metoCLOPramide  10 mg Oral TID AC & HS   Continuous Infusions: . sodium chloride 1,000 mL (09/05/14 0317)   PRN Meds:.acetaminophen, alprazolam, iohexol, ondansetron, senna-docusate  Allergies: No Known Allergies  Family History  Problem Relation Age of Onset  . Sickle cell anemia Daughter   . Schizophrenia Daughter   . Diabetes Mother   . Heart disease Father   . Cancer Sister   . Cancer Sister     Social History:  reports that she has never smoked. She does not have any smokeless tobacco history on file. She reports that she does not drink alcohol or use illicit drugs.  ROS: A complete review of systems was performed.  All systems are negative except for pertinent findings as noted.  Physical Exam:  Vital signs in last 24 hours: Temp:  [97.9 F (36.6 C)-98.7 F (37.1 C)] 98.7 F (37.1 C) (10/14 0553) Pulse Rate:  [102-111] 111 (10/14 0553) Resp:  [16-18] 18 (10/14 0553) BP: (128-141)/(71-80) 137/80 mmHg (10/14 0553) SpO2:  [94 %-100 %] 98 % (10/14 0553) Constitutional:  Alert, No acute distress Cardiovascular: Regular rate and rhythm Respiratory: Normal respiratory effort GI: Abdomen is soft, nontender, nondistended, no abdominal masses GU: No CVA tenderness Lymphatic: No lymphadenopathy Neurologic: aphasia, confusion Psychiatric: Normal mood and affect  Laboratory Data:   Recent Labs  09/04/14 2240 09/05/14 0705 09/07/14 1215  WBC 7.6 6.1 5.9  HGB 13.0 12.1 12.0  HCT 39.5 35.3* 35.5*  PLT 350 295 312     Recent Labs  09/04/14 2240 09/05/14 0705  NA 139 137  K 4.0 4.0  CL 98  102  GLUCOSE 126* 105*  BUN 12 11  CALCIUM 10.0 9.2  CREATININE 0.60 0.54     Results for orders placed during the hospital encounter of 09/04/14 (from the past 24 hour(s))  GLUCOSE, CAPILLARY     Status: Abnormal   Collection Time    09/06/14  4:39 PM      Result Value Ref Range   Glucose-Capillary 160 (*) 70 - 99 mg/dL  GLUCOSE, CAPILLARY     Status: Abnormal   Collection Time  09/06/14  9:06 PM      Result Value Ref Range   Glucose-Capillary 115 (*) 70 - 99 mg/dL   Comment 1 Notify RN     Comment 2 Documented in Chart    GLUCOSE, CAPILLARY     Status: Abnormal   Collection Time    09/07/14  6:33 AM      Result Value Ref Range   Glucose-Capillary 135 (*) 70 - 99 mg/dL   Comment 1 Notify RN     Comment 2 Documented in Chart    GLUCOSE, CAPILLARY     Status: Abnormal   Collection Time    09/07/14 11:46 AM      Result Value Ref Range   Glucose-Capillary 122 (*) 70 - 99 mg/dL  CBC     Status: Abnormal   Collection Time    09/07/14 12:15 PM      Result Value Ref Range   WBC 5.9  4.0 - 10.5 K/uL   RBC 5.47 (*) 3.87 - 5.11 MIL/uL   Hemoglobin 12.0  12.0 - 15.0 g/dL   HCT 35.5 (*) 36.0 - 46.0 %   MCV 64.9 (*) 78.0 - 100.0 fL   MCH 21.9 (*) 26.0 - 34.0 pg   MCHC 33.8  30.0 - 36.0 g/dL   RDW 14.7  11.5 - 15.5 %   Platelets 312  150 - 400 K/uL   No results found for this or any previous visit (from the past 240 hour(s)).  Renal Function:  Recent Labs  09/04/14 2240 09/05/14 0705  CREATININE 0.60 0.54   Estimated Creatinine Clearance: 54.7 ml/min (by C-G formula based on Cr of 0.54).  Radiologic Imaging: Ct Chest W Contrast  09/07/2014   CLINICAL DATA:  Unintentional weight loss.  Acute left CVA.  EXAM: CT CHEST, ABDOMEN, AND PELVIS WITH CONTRAST  TECHNIQUE: Multidetector CT imaging of the chest, abdomen and pelvis was performed following the standard protocol during bolus administration of intravenous contrast.  CONTRAST:  80 cc Omnipaque 300  COMPARISON:   09/04/2014 chest radiograph  FINDINGS: CT CHEST FINDINGS  Right lower paratracheal lymph node upper normal sized at 9 mm. No additional adenopathy in the chest.  Minimal biapical pleural parenchymal scarring. The lungs appear otherwise clear.  CT ABDOMEN AND PELVIS FINDINGS  Hepatobiliary: Punctate 4 mm calcification in segment 2 of the liver, without surrounding abnormal enhancement. Central common bile duct appears dilated at 0.9 cm, with subtle intrahepatic biliary dilatation. Difficult to differentiate the CBD and proximal pancreatic duct as they approach the ampulla ; there may be a small stenotic segment of the distal CBD or the distal CBD may be completely tapered with a dilated segment of the dorsal pancreatic duct in the ampulla.  Pancreas: Possible dilated segment of the dorsal pancreatic duct near the ampulla as noted above. No differential enhancing mass in the pancreatic head or elsewhere in the pancreas.  Spleen: Intact  Adrenals/Urinary Tract: Abnormal peripheral regions of hypoenhancement scattered in the parenchyma of both kidneys, without enhancing capsular margins. No hydronephrosis. Distended urinary bladder, bladder a volume currently 1 L.  Stomach/Bowel: Unremarkable  Vascular/Lymphatic: Unremarkable  Reproductive: Unremarkable  Other: No supplemental non-categorized findings.  Musculoskeletal: Unremarkable  IMPRESSION: 1. Bilateral regions of abnormal low-density in both kidneys, without cortical rim sign. Appearance favors bilateral pyelonephritis. There may be incipient abscess formation in the left mid to upper kidney on image 11 of series 7. Renal infarcts right less likely differential diagnostic consideration given the appearance. 2. Mildly dilated  CBD with an unusual appearance distally where there is narrowing of a segment of the CBD and either subsequent distal dilatation or a small dilated segment of the dorsal pancreatic duct. No differential enhancement in the pancreatic tissue is  observed to suggest a neoplasm, although today's exam was protocol does a standard CT abdomen over rather than a pancreatic protocol exam. 3. Distended urinary bladder, significance uncertain, possibly simply incidental. 4. No worrisome findings in the chest.   Electronically Signed   By: Sherryl Barters M.D.   On: 09/07/2014 07:48   Ct Abdomen Pelvis W Contrast  09/07/2014   CLINICAL DATA:  Unintentional weight loss.  Acute left CVA.  EXAM: CT CHEST, ABDOMEN, AND PELVIS WITH CONTRAST  TECHNIQUE: Multidetector CT imaging of the chest, abdomen and pelvis was performed following the standard protocol during bolus administration of intravenous contrast.  CONTRAST:  80 cc Omnipaque 300  COMPARISON:  09/04/2014 chest radiograph  FINDINGS: CT CHEST FINDINGS  Right lower paratracheal lymph node upper normal sized at 9 mm. No additional adenopathy in the chest.  Minimal biapical pleural parenchymal scarring. The lungs appear otherwise clear.  CT ABDOMEN AND PELVIS FINDINGS  Hepatobiliary: Punctate 4 mm calcification in segment 2 of the liver, without surrounding abnormal enhancement. Central common bile duct appears dilated at 0.9 cm, with subtle intrahepatic biliary dilatation. Difficult to differentiate the CBD and proximal pancreatic duct as they approach the ampulla ; there may be a small stenotic segment of the distal CBD or the distal CBD may be completely tapered with a dilated segment of the dorsal pancreatic duct in the ampulla.  Pancreas: Possible dilated segment of the dorsal pancreatic duct near the ampulla as noted above. No differential enhancing mass in the pancreatic head or elsewhere in the pancreas.  Spleen: Intact  Adrenals/Urinary Tract: Abnormal peripheral regions of hypoenhancement scattered in the parenchyma of both kidneys, without enhancing capsular margins. No hydronephrosis. Distended urinary bladder, bladder a volume currently 1 L.  Stomach/Bowel: Unremarkable  Vascular/Lymphatic:  Unremarkable  Reproductive: Unremarkable  Other: No supplemental non-categorized findings.  Musculoskeletal: Unremarkable  IMPRESSION: 1. Bilateral regions of abnormal low-density in both kidneys, without cortical rim sign. Appearance favors bilateral pyelonephritis. There may be incipient abscess formation in the left mid to upper kidney on image 11 of series 7. Renal infarcts right less likely differential diagnostic consideration given the appearance. 2. Mildly dilated CBD with an unusual appearance distally where there is narrowing of a segment of the CBD and either subsequent distal dilatation or a small dilated segment of the dorsal pancreatic duct. No differential enhancement in the pancreatic tissue is observed to suggest a neoplasm, although today's exam was protocol does a standard CT abdomen over rather than a pancreatic protocol exam. 3. Distended urinary bladder, significance uncertain, possibly simply incidental. 4. No worrisome findings in the chest.   Electronically Signed   By: Sherryl Barters M.D.   On: 09/07/2014 07:48    Impression/Recommendation:  Abnormal kidney appearance--low suspicion of pyelonephritis as pt has had clear UA, no fever, no leukocytosis, and no back/abd pain.  Also very low suspicion of renal abscess as pt would again appear much sicker (pain, fever, elevated WBC, etc).   Review UA collected today and send for culture if appropriate.  Would not start Rocephin unless UA positive.  Kidney appearance more likely due to embolic infarcts.  Workup for embolic source is ongoing. Pt already on ASA and heparin.   Urinary retention--likely long term and related to DM with  possible neurogenic bladder.  Pt had very distended bladder on CT scan with no pain which indicates adaptation over time.  Pt has been voiding on her own but no documentation of output or residual volume in bladder.  Had RN place foley for accurate volume and to obtain UA.  There was an immediate return of  300cc clear urine with continued drainage noted.  Leave foley in place for adequate urine drainage, bladder rest, and prevention of possible UTI/hydronephrosis.  Pt will need to keep foley for several days with void trial at time of removal.  She will also need outpt workup for definitive cause of retention with possible urodynamics.   Maxden Naji 09/07/2014, 1:35 PM

## 2014-09-07 NOTE — Plan of Care (Deleted)
Problem: Acute Rehab OT Goals (only OT should resolve) Goal: Pt. Will Perform Grooming requiring min verbal cues for sequence Goal: Pt. Will Perform Upper Body Bathing Requiring min verbal cues for sequence Goal: Pt. Will Perform Upper Body Dressing Requiring min verbal cues for sequence

## 2014-09-08 ENCOUNTER — Inpatient Hospital Stay (HOSPITAL_COMMUNITY): Payer: Medicare Other

## 2014-09-08 DIAGNOSIS — R079 Chest pain, unspecified: Secondary | ICD-10-CM

## 2014-09-08 LAB — GLUCOSE, CAPILLARY
GLUCOSE-CAPILLARY: 148 mg/dL — AB (ref 70–99)
Glucose-Capillary: 153 mg/dL — ABNORMAL HIGH (ref 70–99)
Glucose-Capillary: 134 mg/dL — ABNORMAL HIGH (ref 70–99)
Glucose-Capillary: 118 mg/dL — ABNORMAL HIGH (ref 70–99)

## 2014-09-08 LAB — BASIC METABOLIC PANEL
ANION GAP: 14 (ref 5–15)
BUN: 9 mg/dL (ref 6–23)
CO2: 21 mEq/L (ref 19–32)
Calcium: 9.4 mg/dL (ref 8.4–10.5)
Chloride: 103 mEq/L (ref 96–112)
Creatinine, Ser: 0.53 mg/dL (ref 0.50–1.10)
Glucose, Bld: 139 mg/dL — ABNORMAL HIGH (ref 70–99)
POTASSIUM: 3.9 meq/L (ref 3.7–5.3)
SODIUM: 138 meq/L (ref 137–147)

## 2014-09-08 LAB — CBC
HCT: 33.7 % — ABNORMAL LOW (ref 36.0–46.0)
HEMOGLOBIN: 11.3 g/dL — AB (ref 12.0–15.0)
MCH: 21.9 pg — ABNORMAL LOW (ref 26.0–34.0)
MCHC: 33.5 g/dL (ref 30.0–36.0)
MCV: 65.4 fL — ABNORMAL LOW (ref 78.0–100.0)
PLATELETS: 281 10*3/uL (ref 150–400)
RBC: 5.15 MIL/uL — AB (ref 3.87–5.11)
RDW: 14.7 % (ref 11.5–15.5)
WBC: 5.7 10*3/uL (ref 4.0–10.5)

## 2014-09-08 LAB — CARDIOLIPIN ANTIBODIES, IGG, IGM, IGA
ANTICARDIOLIPIN IGG: 4 GPL U/mL — AB (ref <23)
ANTICARDIOLIPIN IGM: 1 [MPL'U]/mL — AB (ref <11)
Anticardiolipin IgA: 9 APL U/mL — ABNORMAL LOW (ref <22)

## 2014-09-08 LAB — BETA-2-GLYCOPROTEIN I ABS, IGG/M/A
BETA 2 GLYCO I IGG: 0 G Units (ref <20)
Beta-2-Glycoprotein I IgA: 5 A Units (ref <20)
Beta-2-Glycoprotein I IgM: 3 M Units (ref <20)

## 2014-09-08 LAB — PROTEIN C ACTIVITY: PROTEIN C ACTIVITY: 160 % — AB (ref 75–133)

## 2014-09-08 LAB — URINE CULTURE
Colony Count: NO GROWTH
Culture: NO GROWTH

## 2014-09-08 LAB — PROTEIN S ACTIVITY: Protein S Activity: 116 % (ref 69–129)

## 2014-09-08 LAB — LUPUS ANTICOAGULANT PANEL
DRVVT: 32.9 s
Lupus Anticoagulant: NOT DETECTED
PTT Lupus Anticoagulant: 40.2 s (ref 28.0–43.0)

## 2014-09-08 LAB — PROTEIN S, TOTAL: Protein S Ag, Total: 133 % (ref 60–150)

## 2014-09-08 LAB — PROTEIN C, TOTAL: Protein C, Total: 106 % (ref 72–160)

## 2014-09-08 MED ORDER — REGADENOSON 0.4 MG/5ML IV SOLN
0.4000 mg | Freq: Once | INTRAVENOUS | Status: AC
  Administered 2014-09-08: 0.4 mg via INTRAVENOUS
  Filled 2014-09-08: qty 5

## 2014-09-08 MED ORDER — TECHNETIUM TC 99M SESTAMIBI GENERIC - CARDIOLITE
10.0000 | Freq: Once | INTRAVENOUS | Status: AC | PRN
  Administered 2014-09-08: 10 via INTRAVENOUS

## 2014-09-08 MED ORDER — REGADENOSON 0.4 MG/5ML IV SOLN
INTRAVENOUS | Status: AC
  Administered 2014-09-08: 0.4 mg via INTRAVENOUS
  Filled 2014-09-08: qty 5

## 2014-09-08 MED ORDER — TECHNETIUM TC 99M SESTAMIBI GENERIC - CARDIOLITE
30.0000 | Freq: Once | INTRAVENOUS | Status: AC | PRN
  Administered 2014-09-08: 30 via INTRAVENOUS

## 2014-09-08 NOTE — Progress Notes (Signed)
Patient Profile: 58 y.o. female with a past medical history significant for HTN, DM type 2, sickle cell trait, OA admitted for CVA. TEE negative for thrombus but demonstrated newly diagnosed cardiomyopathy with global hypokinesis. EF 30-35%.   Subjective: No complaints. Denies CP. Speech improved.   Objective: Vital signs in last 24 hours: Temp:  [97.9 F (36.6 C)-98.7 F (37.1 C)] 97.9 F (36.6 C) (10/15 0454) Pulse Rate:  [95-106] 95 (10/15 0454) Resp:  [16-19] 16 (10/15 0454) BP: (127-138)/(58-76) 128/76 mmHg (10/15 0454) SpO2:  [98 %-100 %] 98 % (10/15 0454) Last BM Date: 09/03/14  Intake/Output from previous day: 10/14 0701 - 10/15 0700 In: 68 [IV Piggyback:50] Out: 700 [Urine:700] Intake/Output this shift:    Medications Current Facility-Administered Medications  Medication Dose Route Frequency Provider Last Rate Last Dose  . acetaminophen (TYLENOL) tablet 650 mg  650 mg Oral Q4H PRN Nishant Dhungel, MD      . ALPRAZolam Duanne Moron) tablet 2 mg  2 mg Oral BID PRN Berle Mull, MD      . aspirin EC tablet 81 mg  81 mg Oral Daily Amie Portland, MD   81 mg at 09/08/14 0910  . atorvastatin (LIPITOR) tablet 20 mg  20 mg Oral q1800 Nishant Dhungel, MD   20 mg at 09/07/14 1758  . carvedilol (COREG) tablet 6.25 mg  6.25 mg Oral BID WC Pixie Casino, MD   6.25 mg at 09/08/14 0854  . cefTRIAXone (ROCEPHIN) 1 g in dextrose 5 % 50 mL IVPB  1 g Intravenous Q24H Belkys A Regalado, MD   1 g at 09/07/14 1434  . feeding supplement (GLUCERNA SHAKE) (GLUCERNA SHAKE) liquid 237 mL  237 mL Oral BID BM Rogue Bussing, RD   237 mL at 09/07/14 1038  . heparin injection 5,000 Units  5,000 Units Subcutaneous 3 times per day Berle Mull, MD   5,000 Units at 09/08/14 0451  . insulin aspart (novoLOG) injection 0-15 Units  0-15 Units Subcutaneous TID WC Nishant Dhungel, MD   2 Units at 09/08/14 0716  . insulin aspart (novoLOG) injection 0-5 Units  0-5 Units Subcutaneous QHS Nishant Dhungel,  MD      . lisinopril (PRINIVIL,ZESTRIL) tablet 10 mg  10 mg Oral Daily Berle Mull, MD   10 mg at 09/08/14 0915  . metoCLOPramide (REGLAN) tablet 10 mg  10 mg Oral TID AC & HS Berle Mull, MD   10 mg at 09/08/14 0909  . ondansetron (ZOFRAN-ODT) disintegrating tablet 8 mg  8 mg Oral Q8H PRN Berle Mull, MD      . polyethylene glycol (MIRALAX / GLYCOLAX) packet 17 g  17 g Oral Daily Belkys A Regalado, MD   17 g at 09/07/14 2000  . senna-docusate (Senokot-S) tablet 1 tablet  1 tablet Oral QHS PRN Berle Mull, MD        PE: General appearance: alert, cooperative, appears older than stated age, cachectic and no distress Lungs: clear to auscultation bilaterally Heart: regular rate and rhythm, S1, S2 normal, no murmur, click, rub or gallop Extremities: frail. No LEE Pulses: 2+ and symmetric Skin: warm and dry Neurologic: Grossly normal  Lab Results:   Recent Labs  09/07/14 1215 09/08/14 0548  WBC 5.9 5.7  HGB 12.0 11.3*  HCT 35.5* 33.7*  PLT 312 281   BMET  Recent Labs  09/08/14 0548  NA 138  K 3.9  CL 103  CO2 21  GLUCOSE 139*  BUN 9  CREATININE 0.53  CALCIUM 9.4  PT/INR  Cholesterol  Recent Labs  09/05/14 2308  CHOL 141    Assessment/Plan    Principal Problem:   CVA (cerebral infarction) Active Problems:   SICKLE CELL TRAIT   Anxiety state   Diabetes mellitus   HTN (hypertension)   Secondary cardiomyopathy   Urinary retention  1. Cardiomyopathy: EF 30-35% by TEE. Given the recent stroke, catheterization is relatively contraindicated. Plan for Lexiscan NST today to assess for ischemia. Her husband has given consent. Continue ACE-I(lisinopril) and BB (Coreg). She has had some periods of sinus tach on telemetry. She may need further titration of Coreg to 12.5 mg BID. Her volume status appears stable. Continue ASA.   2. CVA: TEE negative for thrombus. No Afib on tele. Neurologic deficits improving with notable improvement with speech. On ASA and statin.    3. HTN: BP well controlled.    LOS: 4 days    Brittainy M. Ladoris Gene 09/08/2014 9:58 AM  Personally seen and examined. Agree with above. Await results of NUC stress. Consider increasing coreg tomorrow if BP allows.  Candee Furbish, MD

## 2014-09-08 NOTE — Progress Notes (Signed)
UR complete.  Kenshin Splawn RN, MSN 

## 2014-09-08 NOTE — Progress Notes (Signed)
Rehab admissions - I met with pt and her daughter in follow up to rehab MD consult. Per Dr. Kirsteins: "Mainly needs speech therapy and some supervision for mobility. With a Wernicke's aphasia do not think he will have a receptive ability to make adequate progress at CIR level"  We are recommending that home with outpt therapies be pursued if 24-7 supervision is available. If family cannot provide needed supervision, then skilled nursing could be pursued.  Dtr was in understanding of this recommendation. I will now sign off pt's case. Please call me with any questions. Thanks.   , PT Rehabilitation Admissions Coordinator [phone]   

## 2014-09-08 NOTE — Progress Notes (Signed)
Occupational Therapy Treatment Patient Details Name: Kristen Lozano MRN: [id number] DOB: 1955/12/27 Today's Date: 09/08/2014    History of present illness 58 y.o. female with PMH significant for hypertension, depression, diabetes mellitus admitted with confusion, language impairment. Imaging positive for CVA. 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    OT comments  Pt admitted with an acute L temporal and parietal CVA. Pt currently with functional limitiations due to the deficits listed below (see OT problem list). Pt was very impulsive and easily distracted during session today and had difficulty following commands. Pt demonstrating increased verbalizations, but still has significant receptive and expressive language deficits.  Pt will benefit from skilled OT to increase independence and safety with adls and balance to allow discharge home.     Follow Up Recommendations  CIR    Equipment Recommendations  None recommended by OT    Recommendations for Other Services      Precautions / Restrictions Precautions Precautions: Fall Restrictions Weight Bearing Restrictions: No       Mobility Bed Mobility Overal bed mobility: Needs Assistance Bed Mobility: Supine to Sit     Supine to sit: Supervision Sit to supine: Min guard   General bed mobility comments: Pt very impulsive and sat EOB and attempted to stand before asked to   Transfers Overall transfer level: Needs assistance Equipment used: None Transfers: Sit to/from Stand Sit to Stand: Min guard         General transfer comment: min guard for safety, pt is impulsive and has difficulty following simple commands    Balance Overall balance assessment: Needs assistance Sitting-balance support: No upper extremity supported;Feet  supported Sitting balance-Leahy Scale: Good     Standing balance support: No upper extremity supported;During functional activity Standing balance-Leahy Scale: Good Standing balance comment: Pt leaning to right side during sink level adl task.                    ADL Overall ADL's : Needs assistance/impaired     Grooming: Wash/dry face;Oral care;Min guard;Standing                               Functional mobility during ADLs: Min guard General ADL Comments: Pt's communciation improving. Pt recalled name of 2 items during adl task. pt would not walk over obstacles (x3) placed in front over her even with verbal cues while walking in hallway. Pt stepped around obstacles instead. Pt would not follow commands to look up, down, right, and left while ambulating in hallway. Pt easily distracted in hallway      Vision                     Perception     Praxis      Cognition   Behavior During Therapy: Flat affect Overall Cognitive Status: Impaired/Different from baseline Area of Impairment: Following commands;Safety/judgement;Awareness;Orientation;Attention;Problem solving   Current Attention Level: Sustained    Following Commands: Follows one step commands inconsistently;Follows multi-step commands inconsistently Safety/Judgement: Decreased awareness of safety;Decreased awareness of deficits Awareness: Intellectual Problem Solving: Decreased initiation;Difficulty sequencing;Requires verbal cues;Requires tactile cues General Comments: Pt easily distracted by people coming in and out of the room during sink level adl task. Pt follows commands inconsistently and has receptive and expressive language deficits. Pt is also  impulsive and has decreased awareness of deficits which limits safety with adls and mobility    Extremity/Trunk Assessment               Exercises     Shoulder Instructions       General Comments      Pertinent Vitals/ Pain        Pain Assessment: No/denies pain  Home Living                                          Prior Functioning/Environment              Frequency Min 2X/week     Progress Toward Goals  OT Goals(current goals can now be found in the care plan section)     Acute Rehab OT Goals Patient Stated Goal: none stated ADL Goals Pt Will Perform Grooming: standing;with supervision Pt Will Perform Upper Body Bathing: with supervision;standing Pt Will Perform Upper Body Dressing: with supervision;standing  Plan      Co-evaluation                 End of Session Equipment Utilized During Treatment: Gait belt   Activity Tolerance Patient tolerated treatment well   Patient Left in chair;with call bell/phone within reach;with chair alarm set   Nurse Communication Mobility status;Precautions        Time: 7048-8891 OT Time Calculation (min): 24 min  Charges:    Redmond Baseman 09/08/2014, 2:19 PM

## 2014-09-08 NOTE — Progress Notes (Signed)
Physical Therapy Treatment Patient Details Name: Kristen Lozano MRN: [id number] DOB: 11-16-1956 Today's Date: 09/08/2014    History of Present Illness 58 y.o. female with PMH significant for hypertension, depression, diabetes mellitus admitted with confusion, language impairment. Imaging positive for CVA. 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     PT Comments    *Increased verbalization noted today. Pt not oriented to place nor to date, she has difficulty following directions and is easily distracted. She walked 42' with min/guard assist for safety, no LOB noted. **  Follow Up Recommendations  CIR     Equipment Recommendations  None recommended by PT    Recommendations for Other Services Rehab consult     Precautions / Restrictions Precautions Precautions: Fall Restrictions Weight Bearing Restrictions: No    Mobility  Bed Mobility Overal bed mobility: Needs Assistance Bed Mobility: Supine to Sit;Sit to Supine     Supine to sit: Supervision Sit to supine: Min assist   General bed mobility comments: able to come up to sit with supervision, min A and verbal cues for BLEs into bed with sit to supine  Transfers Overall transfer level: Needs assistance Equipment used: None Transfers: Sit to/from Stand Sit to Stand: Min guard         General transfer comment: min-guard for safety, no LOB with standing  Ambulation/Gait Ambulation/Gait assistance: Min guard Ambulation Distance (Feet): 260 Feet Assistive device: None Gait Pattern/deviations: Drifts right/left;Narrow base of support;Step-through pattern Gait velocity: WFL   General Gait Details:  easily distracted by environment, no LOB with head turns today   Stairs            Wheelchair Mobility    Modified Rankin  (Stroke Patients Only) Modified Rankin (Stroke Patients Only) Pre-Morbid Rankin Score: No symptoms Modified Rankin: Moderately severe disability     Balance     Sitting balance-Leahy Scale: Good       Standing balance-Leahy Scale: Good                      Cognition Arousal/Alertness: Awake/alert Behavior During Therapy: Flat affect Overall Cognitive Status: Impaired/Different from baseline Area of Impairment: Following commands;Safety/judgement;Orientation (Pt not oriented to location, nor to year. )       Following Commands: Follows one step commands inconsistently;Follows multi-step commands inconsistently Safety/Judgement: Decreased awareness of safety;Decreased awareness of deficits     General Comments: pt inconsistently follows commands, obvious receptive issues as well as expressive. This along with decreased awareness significantly limits safety with mobility    Exercises      General Comments        Pertinent Vitals/Pain Pain Assessment: No/denies pain    Home Living                      Prior Function            PT Goals (current goals can now be found in the care plan section) Acute Rehab PT Goals Patient Stated Goal: none stated PT Goal Formulation: With family Time For Goal Achievement: 09/20/14 Potential to Achieve Goals: Good Progress towards PT goals: Progressing toward goals    Frequency  Min 4X/week    PT Plan Discharge plan needs to be updated    Co-evaluation             End of  Session Equipment Utilized During Treatment: Gait belt Activity Tolerance: Patient tolerated treatment well Patient left: in bed;with bed alarm set;with family/visitor present;with call bell/phone within reach     Time: 1015-1038 PT Time Calculation (min): 23 min  Charges:  $Gait Training: 8-22 mins $Therapeutic Activity: 8-22 mins                    G Codes:      Philomena Doheny 09/08/2014, 10:49  AM [phone]

## 2014-09-08 NOTE — Progress Notes (Signed)
Pt family expressed concern for pt possible discharge home. They do not believe there will be enough supervision for the patient at home and are concerned about her safety. Both daughters and husband would like to be spoken to about other options and possible skilled nursing.

## 2014-09-08 NOTE — Progress Notes (Signed)
[facility] PROGRESS NOTE  MONTIE GELARDI RUE:[id number] DOB: 1956/11/11 DOA: 09/04/2014 PCP: Elyn Peers, MD   Brief narrative 58 y.o. female with past medical history of hypertension, depression,  diabetes mellitus with  Possible gastroparesis  presented with complaints of aphasia. She was last seen and normal about 6 hours prior to being found by daughter to be non verbal. As per daughter,  Patient was confused and responding to every question with "yes".  EMS  noticed a facial droopon the right. Head CT on admission showing a left perforator infarct. Admitted for further stroke w/up. Patient was out of therapeutic tPA window so did not receive tPA.   Assessment/Plan: Acute left sided stroke  -CT head shows left perforator infarct involving large area of left MCA distrubution. - MRI brain, MRA head shows diffuse left sided infarct as outlined below. -2D echo shows EF of 35-40% with global hypokinesis , possible recent ischemic heart disease. -carotid dopplers with non significant stenosis. -PT/OT eval recommends HHPT, SNF ?  -continue daily aspirin. LDL of 75. On statin -hypercoagulable and vasculitis w/up sent : ESR normal, SSA negative, Myeloperoxide antibodies negative, ANCA negative, RPR non reactive, HIV non reactive, anticardiolipin low, antithrombin III negative, Factor V leiden, lupus anticoagulant, Protein S, C  , B 2 globulin negative, Complement pending.  -discussed with family regarding neurologist recommendation, coumadin for secondary stroke prevention. Family still thinking about coumadin vs aspirin. . Dr Erlinda Hong will speak with family again. Cardiology also to follow.   Abnormal renal finding on CT scan:  -Bilateral regions of abnormal low-density in both kidneys, without cortical rim sign. Appearance favors bilateral  pyelonephritis. There may be incipient abscess formation in the left mid to upper kidney on image 11 of series 7. Renal infarcts right less likely  differential diagnostic consideration given the appearance. -Check UA, urine culture pending.  -bladder distension on CT. Foley placed.  -Urology consulted.  -Start Ceftriaxone.  -per husband she was diagnosed with UTI last week.   Mildly dilated CBD; LFT normal.   DM type 2 Monitor on SSI.  A1C of 6.2  Cardiomyopathy  possibly ischemic Cardiology consulted per neurologist request.  Started on coreg and lisinopril.  Follow cardiology recommendation regarding anticoagulation.  lexiscan pending.   HTN  stable  malnutrition  suppleemnts  Code Status: FULL  Family Communication: Husband, daughter   at bedside Disposition Plan: possible d/c home in 1-2 days   Consultants:  Stroke team   urology  Procedures:  MRI brain  2D echo carotid doppler  Antibiotics:  Ceftriaxone 10-14  HPI/Subjective: Patient with word finding difficulty.    Objective: Filed Vitals:   09/08/14 1234  BP: 120/68  Pulse:   Temp:   Resp:     Intake/Output Summary (Last 24 hours) at 09/08/14 1406 Last data filed at 09/07/14 1434  Gross per 24 hour  Intake     50 ml  Output    700 ml  Net   -650 ml   Filed Weights   09/05/14 0204  Weight: 45.813 kg (101 lb)    Exam:   General: NAD,   HEETN: rt facial. Droop,  moist mucosa  Cardiovascular: S1&S2 normal  no muurmurs  Respiratory: clear b/l  Abdomen: soft, NT, ND,   Musculoskeletal:warm, no edema  CNS: alert and awake, oriented 1x2, has word finding difficulty, rt facial droop. Good strength b/l   Data Reviewed: Basic Metabolic Panel:  Recent Labs Lab 09/04/14 2240 09/05/14 0705 09/08/14 0548  NA 139 137  138  K 4.0 4.0 3.9  CL 98 102 103  CO2 25 22 21   GLUCOSE 126* 105* 139*  BUN 12 11 9   CREATININE 0.60 0.54 0.53  CALCIUM 10.0 9.2 9.4   Liver Function Tests:  Recent Labs Lab 09/04/14 2240 09/05/14 0705  AST 15 14  ALT 8 7  ALKPHOS 49 44  BILITOT 0.8 0.8  PROT 8.0 7.0  ALBUMIN 3.5 3.1*    No results found for this basename: LIPASE, AMYLASE,  in the last 168 hours  Recent Labs Lab 09/04/14 2240  AMMONIA 20   CBC:  Recent Labs Lab 09/04/14 2240 09/05/14 0705 09/07/14 1215 09/08/14 0548  WBC 7.6 6.1 5.9 5.7  NEUTROABS 4.3 3.4  --   --   HGB 13.0 12.1 12.0 11.3*  HCT 39.5 35.3* 35.5* 33.7*  MCV 66.7* 64.8* 64.9* 65.4*  PLT 350 295 312 281   Cardiac Enzymes: No results found for this basename: CKTOTAL, CKMB, CKMBINDEX, TROPONINI,  in the last 168 hours BNP (last 3 results) No results found for this basename: PROBNP,  in the last 8760 hours CBG:  Recent Labs Lab 09/07/14 0633 09/07/14 1146 09/07/14 1638 09/07/14 2054 09/08/14 0631  GLUCAP 135* 122* 172* 154* 148*    No results found for this or any previous visit (from the past 240 hour(s)).   Studies: Ct Chest W Contrast  09/07/2014   CLINICAL DATA:  Unintentional weight loss.  Acute left CVA.  EXAM: CT CHEST, ABDOMEN, AND PELVIS WITH CONTRAST  TECHNIQUE: Multidetector CT imaging of the chest, abdomen and pelvis was performed following the standard protocol during bolus administration of intravenous contrast.  CONTRAST:  80 cc Omnipaque 300  COMPARISON:  09/04/2014 chest radiograph  FINDINGS: CT CHEST FINDINGS  Right lower paratracheal lymph node upper normal sized at 9 mm. No additional adenopathy in the chest.  Minimal biapical pleural parenchymal scarring. The lungs appear otherwise clear.  CT ABDOMEN AND PELVIS FINDINGS  Hepatobiliary: Punctate 4 mm calcification in segment 2 of the liver, without surrounding abnormal enhancement. Central common bile duct appears dilated at 0.9 cm, with subtle intrahepatic biliary dilatation. Difficult to differentiate the CBD and proximal pancreatic duct as they approach the ampulla ; there may be a small stenotic segment of the distal CBD or the distal CBD may be completely tapered with a dilated segment of the dorsal pancreatic duct in the ampulla.  Pancreas: Possible  dilated segment of the dorsal pancreatic duct near the ampulla as noted above. No differential enhancing mass in the pancreatic head or elsewhere in the pancreas.  Spleen: Intact  Adrenals/Urinary Tract: Abnormal peripheral regions of hypoenhancement scattered in the parenchyma of both kidneys, without enhancing capsular margins. No hydronephrosis. Distended urinary bladder, bladder a volume currently 1 L.  Stomach/Bowel: Unremarkable  Vascular/Lymphatic: Unremarkable  Reproductive: Unremarkable  Other: No supplemental non-categorized findings.  Musculoskeletal: Unremarkable  IMPRESSION: 1. Bilateral regions of abnormal low-density in both kidneys, without cortical rim sign. Appearance favors bilateral pyelonephritis. There may be incipient abscess formation in the left mid to upper kidney on image 11 of series 7. Renal infarcts right less likely differential diagnostic consideration given the appearance. 2. Mildly dilated CBD with an unusual appearance distally where there is narrowing of a segment of the CBD and either subsequent distal dilatation or a small dilated segment of the dorsal pancreatic duct. No differential enhancement in the pancreatic tissue is observed to suggest a neoplasm, although today's exam was protocol does a standard CT abdomen over  rather than a pancreatic protocol exam. 3. Distended urinary bladder, significance uncertain, possibly simply incidental. 4. No worrisome findings in the chest.   Electronically Signed   By: Sherryl Barters M.D.   On: 09/07/2014 07:48   Ct Abdomen Pelvis W Contrast  09/07/2014   CLINICAL DATA:  Unintentional weight loss.  Acute left CVA.  EXAM: CT CHEST, ABDOMEN, AND PELVIS WITH CONTRAST  TECHNIQUE: Multidetector CT imaging of the chest, abdomen and pelvis was performed following the standard protocol during bolus administration of intravenous contrast.  CONTRAST:  80 cc Omnipaque 300  COMPARISON:  09/04/2014 chest radiograph  FINDINGS: CT CHEST FINDINGS   Right lower paratracheal lymph node upper normal sized at 9 mm. No additional adenopathy in the chest.  Minimal biapical pleural parenchymal scarring. The lungs appear otherwise clear.  CT ABDOMEN AND PELVIS FINDINGS  Hepatobiliary: Punctate 4 mm calcification in segment 2 of the liver, without surrounding abnormal enhancement. Central common bile duct appears dilated at 0.9 cm, with subtle intrahepatic biliary dilatation. Difficult to differentiate the CBD and proximal pancreatic duct as they approach the ampulla ; there may be a small stenotic segment of the distal CBD or the distal CBD may be completely tapered with a dilated segment of the dorsal pancreatic duct in the ampulla.  Pancreas: Possible dilated segment of the dorsal pancreatic duct near the ampulla as noted above. No differential enhancing mass in the pancreatic head or elsewhere in the pancreas.  Spleen: Intact  Adrenals/Urinary Tract: Abnormal peripheral regions of hypoenhancement scattered in the parenchyma of both kidneys, without enhancing capsular margins. No hydronephrosis. Distended urinary bladder, bladder a volume currently 1 L.  Stomach/Bowel: Unremarkable  Vascular/Lymphatic: Unremarkable  Reproductive: Unremarkable  Other: No supplemental non-categorized findings.  Musculoskeletal: Unremarkable  IMPRESSION: 1. Bilateral regions of abnormal low-density in both kidneys, without cortical rim sign. Appearance favors bilateral pyelonephritis. There may be incipient abscess formation in the left mid to upper kidney on image 11 of series 7. Renal infarcts right less likely differential diagnostic consideration given the appearance. 2. Mildly dilated CBD with an unusual appearance distally where there is narrowing of a segment of the CBD and either subsequent distal dilatation or a small dilated segment of the dorsal pancreatic duct. No differential enhancement in the pancreatic tissue is observed to suggest a neoplasm, although today's exam was  protocol does a standard CT abdomen over rather than a pancreatic protocol exam. 3. Distended urinary bladder, significance uncertain, possibly simply incidental. 4. No worrisome findings in the chest.   Electronically Signed   By: Sherryl Barters M.D.   On: 09/07/2014 07:48    Scheduled Meds: . aspirin EC  81 mg Oral Daily  . atorvastatin  20 mg Oral q1800  . carvedilol  6.25 mg Oral BID WC  . cefTRIAXone (ROCEPHIN)  IV  1 g Intravenous Q24H  . feeding supplement (GLUCERNA SHAKE)  237 mL Oral BID BM  . heparin  5,000 Units Subcutaneous 3 times per day  . insulin aspart  0-15 Units Subcutaneous TID WC  . insulin aspart  0-5 Units Subcutaneous QHS  . lisinopril  10 mg Oral Daily  . metoCLOPramide  10 mg Oral TID AC & HS  . polyethylene glycol  17 g Oral Daily   Continuous Infusions:      Time spent: 25 minutes    Hendrik Donath, [facility] Pager [phone]. If 7PM-7AM, please contact night-coverage at [email or web address], password [facility] 09/08/2014, 2:06 PM  LOS: 4 days

## 2014-09-08 NOTE — Progress Notes (Signed)
Attempted to evaluate patient--is of the room for procedure.

## 2014-09-08 NOTE — Progress Notes (Signed)
09/08/14 1300  OT Time Calculation  OT Start Time 1328  OT Stop Time 1352  OT Time Calculation (min) 24 min  OT Evaluation  $Initial OT Evaluation Tier I 1 Procedure  OT Treatments  $Self Care/Home Management  23-37 mins   I agree with the following treatment note after reviewing documentation.   Jeri Modena OTR/L Pager: [phone] Office: [phone] .

## 2014-09-08 NOTE — Consult Note (Signed)
Physical Medicine and Rehabilitation Consult  Reason for Consult: Global aphasia with balance deficits. Referring Physician: Dr. Tyrell Antonio.    HPI: Kristen Lozano is a 58 y.o. female with history of DM type 2, anxiety disorder, sickle cell trait, DJD bilateral knees, who was admitted on 09/04/14 with confusion and difficulty speaking. MRI brain with acute nonhemorrhagic infarct affecting left t  lentiform nucleus and caudate head as well as  posterior left temporal and parietal lobe, potentially affecting Wernicke's area. 2D echo with EF 30-35% with severe global hypokinesis with inferior akinesis. Carotid dopplers without ICA stenosis. TEE without PFO or LAA thrombus. BLE dopplers without DVT. Cardiology recommended Lexicon scan for ischemia workup as well as BB for persistent tachycardia. CT abdomen/Chest/pelvis negative for malignancy but showed evidence of bilateral pyelonephritis with question of incipient abscess formation, distended bladder and mildly dilated CBD. Urology consulted for input and recommended foley to help with likely long term urinary retention due to diabetic neurogenic bladder and doubt pyelonephritis due to negative UA and no leucocytosis or back/abdominal pain. Voiding trial after several days as well as follow up with GU on outpatient basis.   She continues to have global aphasia with poor safety awareness, difficulty following commands with verbal/tactile cues with ADL tasks as well as lack of insight into deficits. Verbal output is improving.  Patient lives alone and required assistance with home management tasks PTA. She continues to have balance deficits  with high fall risk and CIR recommended for follow up therapy.    ROS   Past Medical History  Diagnosis Date  . Anxiety     Has been on Xanax since 2009. Uses it for stress, anxiety, and insomnia. No contract yet.  . Depression   . Sickle cell trait   . Diabetes mellitus     Type 2, non insulin  dependent. Was dx'd prior to 2008.  Marland Kitchen Hypertension   . Chronic pain     Has OA of knees B. No Xrays in echart. Not requiring narcotics.  . Allergic rhinitis     Requires cetirizine, singulair, and fluticasone.  . Arthritis     Past Surgical History  Procedure Laterality Date  . Tee without cardioversion N/A 09/06/2014    Procedure: TRANSESOPHAGEAL ECHOCARDIOGRAM (TEE);  Surgeon: Pixie Casino, MD;  Location: [facility] ENDOSCOPY;  Service: Cardiovascular;  Laterality: N/A;    Family History  Problem Relation Age of Onset  . Sickle cell anemia Daughter   . Schizophrenia Daughter   . Diabetes Mother   . Heart disease Father   . Cancer Sister   . Cancer Sister     Social History:  reports that she has never smoked. She does not have any smokeless tobacco history on file. She reports that she does not drink alcohol or use illicit drugs.  Allergies: No Known Allergies  Medications Prior to Admission  Medication Sig Dispense Refill  . alprazolam (XANAX) 2 MG tablet Take 2 mg by mouth 3 (three) times daily as needed for anxiety.      . insulin aspart protamine- aspart (NOVOLOG MIX 70/30) (70-30) 100 UNIT/ML injection Inject into the skin every morning.       . Insulin Glargine (LANTUS SOLOSTAR) 100 UNIT/ML Solostar Pen Inject into the skin every evening.       Marland Kitchen lisinopril (PRINIVIL,ZESTRIL) 10 MG tablet Take 10 mg by mouth daily.      . meloxicam (MOBIC) 15 MG tablet Take 15 mg by mouth 2 (two)  times daily as needed for pain.       . metFORMIN (GLUCOPHAGE) 1000 MG tablet Take 1,000 mg by mouth 2 (two) times daily as needed (when insulin is not available).       . metoCLOPramide (REGLAN) 10 MG tablet Take 1 tablet (10 mg total) by mouth 4 (four) times daily -  before meals and at bedtime.  30 tablet  0  . ondansetron (ZOFRAN ODT) 8 MG disintegrating tablet Take 1 tablet (8 mg total) by mouth every 8 (eight) hours as needed for nausea or vomiting.  20 tablet  0  . oxyCODONE-acetaminophen  (PERCOCET) 10-325 MG per tablet Take 1 tablet by mouth 3 (three) times daily as needed. For pain        Home: Home Living Family/patient expects to be discharged to:: Private residence Living Arrangements: Alone Available Help at Discharge: Family Type of Home: House Home Access: Stairs to enter Technical brewer of Steps: 1 Home Layout: One level Home Equipment: None  Functional History: Prior Function Level of Independence: Independent Comments: Family would come to help with household tasks Functional Status:  Mobility: Bed Mobility Overal bed mobility: Needs Assistance Bed Mobility: Supine to Sit;Sit to Supine Supine to sit: Supervision Sit to supine: Min assist General bed mobility comments: pt able to come to sitting with supervision but quickly began to lie back down. Hand gestures needed to get pt to stay up to walk. With return to bed, pt able to get self into bed but laying on very edge and not following instructions, difficulty scooting to get into middle of bed. Min A given to move hips to center, bed alarm set Transfers Overall transfer level: Needs assistance Equipment used: None Transfers: Sit to/from Stand Sit to Stand: Min guard General transfer comment: min-guard for safety, no LOB with standing Ambulation/Gait Ambulation/Gait assistance: Min guard;Min assist Ambulation Distance (Feet): 200 Feet Assistive device: None Gait Pattern/deviations: Staggering left;Staggering right;Drifts right/left;Narrow base of support Gait velocity: WFL Gait velocity interpretation: Below normal speed for age/gender General Gait Details: pt staggers left and right with head turns, easily distracted by environment. When pt attending to environment, ambulates with min-guard A but staggering resulted in need for intermittent min A to prevent fall. Worked on quick starting and stopping but pt having difficulty following instructions to complete task appropriately.      ADL: ADL Overall ADL's : Needs assistance/impaired Grooming: Wash/dry hands;Wash/dry face;Minimal assistance;Cueing for sequencing;Standing Grooming Details (indicate cue type and reason): Pt needed verbal and visual cues to initiate and complete sequence of ADL tasks. Toilet Transfer: Supervision/safety;Regular Theatre manager and Hygiene: Minimal assistance;Cueing for sequencing;Sit to/from stand Toileting - Clothing Manipulation Details (indicate cue type and reason): Pt stood up and walked to sink before pulling up pants completely. Pt needed mod cues to pull up pants and button them. Functional mobility during ADLs: Supervision/safety General ADL Comments: Pt required verbal and tactile cues to initiate and complete sequence of adl tasks. Pt required manual assist to initiate hand washing and face washing and mod verbal cues to complete sequence of both tasks.   Cognition: Cognition Overall Cognitive Status: Impaired/Different from baseline Arousal/Alertness: Awake/alert Orientation Level: Oriented to person;Disoriented to place;Disoriented to time;Disoriented to situation Attention: Sustained Sustained Attention: Impaired Sustained Attention Impairment: Verbal basic;Functional basic Awareness: Impaired Awareness Impairment: Emergent impairment (does not seem aware of linguistic errors) Behaviors: Perseveration Safety/Judgment: Impaired Cognition Arousal/Alertness: Awake/alert Behavior During Therapy: Flat affect Overall Cognitive Status: Impaired/Different from baseline Area of Impairment: Following commands;Safety/judgement  Following Commands: Follows one step commands inconsistently;Follows multi-step commands inconsistently Safety/Judgement: Decreased awareness of safety;Decreased awareness of deficits General Comments: pt inconsistently follows commands, obvious receptive issues as well as expressive. This along with decreased awareness  significantly limits safety with mobility Difficult to assess due to: Impaired communication  Blood pressure 128/76, pulse 95, temperature 97.9 F (36.6 C), temperature source Oral, resp. rate 16, height 5' 6"  (1.676 m), weight 45.813 kg (101 lb), SpO2 98.00%. Physical Exam  Results for orders placed during the hospital encounter of 09/04/14 (from the past 24 hour(s))  GLUCOSE, CAPILLARY     Status: Abnormal   Collection Time    09/07/14 11:46 AM      Result Value Ref Range   Glucose-Capillary 122 (*) 70 - 99 mg/dL  CBC     Status: Abnormal   Collection Time    09/07/14 12:15 PM      Result Value Ref Range   WBC 5.9  4.0 - 10.5 K/uL   RBC 5.47 (*) 3.87 - 5.11 MIL/uL   Hemoglobin 12.0  12.0 - 15.0 g/dL   HCT 35.5 (*) 36.0 - 46.0 %   MCV 64.9 (*) 78.0 - 100.0 fL   MCH 21.9 (*) 26.0 - 34.0 pg   MCHC 33.8  30.0 - 36.0 g/dL   RDW 14.7  11.5 - 15.5 %   Platelets 312  150 - 400 K/uL  URINALYSIS, ROUTINE W REFLEX MICROSCOPIC     Status: None   Collection Time    09/07/14  2:05 PM      Result Value Ref Range   Color, Urine YELLOW  YELLOW   APPearance CLEAR  CLEAR   Specific Gravity, Urine 1.021  1.005 - 1.030   pH 6.0  5.0 - 8.0   Glucose, UA NEGATIVE  NEGATIVE mg/dL   Hgb urine dipstick NEGATIVE  NEGATIVE   Bilirubin Urine NEGATIVE  NEGATIVE   Ketones, ur NEGATIVE  NEGATIVE mg/dL   Protein, ur NEGATIVE  NEGATIVE mg/dL   Urobilinogen, UA 1.0  0.0 - 1.0 mg/dL   Nitrite NEGATIVE  NEGATIVE   Leukocytes, UA NEGATIVE  NEGATIVE  GLUCOSE, CAPILLARY     Status: Abnormal   Collection Time    09/07/14  4:38 PM      Result Value Ref Range   Glucose-Capillary 172 (*) 70 - 99 mg/dL   Comment 1 Notify RN    GLUCOSE, CAPILLARY     Status: Abnormal   Collection Time    09/07/14  8:54 PM      Result Value Ref Range   Glucose-Capillary 154 (*) 70 - 99 mg/dL   Comment 1 Notify RN     Comment 2 Documented in Chart    BASIC METABOLIC PANEL     Status: Abnormal   Collection Time    09/08/14   5:48 AM      Result Value Ref Range   Sodium 138  137 - 147 mEq/L   Potassium 3.9  3.7 - 5.3 mEq/L   Chloride 103  96 - 112 mEq/L   CO2 21  19 - 32 mEq/L   Glucose, Bld 139 (*) 70 - 99 mg/dL   BUN 9  6 - 23 mg/dL   Creatinine, Ser 0.53  0.50 - 1.10 mg/dL   Calcium 9.4  8.4 - 10.5 mg/dL   GFR calc non Af Amer >90  >90 mL/min   GFR calc Af Amer >90  >90 mL/min   Anion gap 14  5 -  15  CBC     Status: Abnormal   Collection Time    09/08/14  5:48 AM      Result Value Ref Range   WBC 5.7  4.0 - 10.5 K/uL   RBC 5.15 (*) 3.87 - 5.11 MIL/uL   Hemoglobin 11.3 (*) 12.0 - 15.0 g/dL   HCT 33.7 (*) 36.0 - 46.0 %   MCV 65.4 (*) 78.0 - 100.0 fL   MCH 21.9 (*) 26.0 - 34.0 pg   MCHC 33.5  30.0 - 36.0 g/dL   RDW 14.7  11.5 - 15.5 %   Platelets 281  150 - 400 K/uL  GLUCOSE, CAPILLARY     Status: Abnormal   Collection Time    09/08/14  6:31 AM      Result Value Ref Range   Glucose-Capillary 148 (*) 70 - 99 mg/dL   Comment 1 Notify RN     Comment 2 Documented in Chart     Ct Chest W Contrast  09/07/2014   CLINICAL DATA:  Unintentional weight loss.  Acute left CVA.  EXAM: CT CHEST, ABDOMEN, AND PELVIS WITH CONTRAST  TECHNIQUE: Multidetector CT imaging of the chest, abdomen and pelvis was performed following the standard protocol during bolus administration of intravenous contrast.  CONTRAST:  80 cc Omnipaque 300  COMPARISON:  09/04/2014 chest radiograph  FINDINGS: CT CHEST FINDINGS  Right lower paratracheal lymph node upper normal sized at 9 mm. No additional adenopathy in the chest.  Minimal biapical pleural parenchymal scarring. The lungs appear otherwise clear.  CT ABDOMEN AND PELVIS FINDINGS  Hepatobiliary: Punctate 4 mm calcification in segment 2 of the liver, without surrounding abnormal enhancement. Central common bile duct appears dilated at 0.9 cm, with subtle intrahepatic biliary dilatation. Difficult to differentiate the CBD and proximal pancreatic duct as they approach the ampulla ; there  may be a small stenotic segment of the distal CBD or the distal CBD may be completely tapered with a dilated segment of the dorsal pancreatic duct in the ampulla.  Pancreas: Possible dilated segment of the dorsal pancreatic duct near the ampulla as noted above. No differential enhancing mass in the pancreatic head or elsewhere in the pancreas.  Spleen: Intact  Adrenals/Urinary Tract: Abnormal peripheral regions of hypoenhancement scattered in the parenchyma of both kidneys, without enhancing capsular margins. No hydronephrosis. Distended urinary bladder, bladder a volume currently 1 L.  Stomach/Bowel: Unremarkable  Vascular/Lymphatic: Unremarkable  Reproductive: Unremarkable  Other: No supplemental non-categorized findings.  Musculoskeletal: Unremarkable  IMPRESSION: 1. Bilateral regions of abnormal low-density in both kidneys, without cortical rim sign. Appearance favors bilateral pyelonephritis. There may be incipient abscess formation in the left mid to upper kidney on image 11 of series 7. Renal infarcts right less likely differential diagnostic consideration given the appearance. 2. Mildly dilated CBD with an unusual appearance distally where there is narrowing of a segment of the CBD and either subsequent distal dilatation or a small dilated segment of the dorsal pancreatic duct. No differential enhancement in the pancreatic tissue is observed to suggest a neoplasm, although today's exam was protocol does a standard CT abdomen over rather than a pancreatic protocol exam. 3. Distended urinary bladder, significance uncertain, possibly simply incidental. 4. No worrisome findings in the chest.   Electronically Signed   By: Sherryl Barters M.D.   On: 09/07/2014 07:48     Assessment/Plan: Diagnosis: Left Temp parietal infarct with minimal mobility issues and a Wernicke's aphasia 1. Does the need for close, 24 hr/day medical supervision  in concert with the patient's rehab needs make it unreasonable for this  patient to be served in a less intensive setting? No 2. Co-Morbidities requiring supervision/potential complications: DM 3. Due to safety, does the patient require 24 hr/day rehab nursing? No 4. Does the patient require coordinated care of a physician, rehab nurse, PT,OT,SLP to address physical and functional deficits in the context of the above medical diagnosis(es)? No Addressing deficits in the following areas: balance, endurance, locomotion, strength, transferring, bowel/bladder control, bathing, dressing, cognition and speech 5. Can the patient actively participate in an intensive therapy program of at least 3 hrs of therapy per day at least 5 days per week? Potentially 6. The potential for patient to make measurable gains while on inpatient rehab is poor 7. Anticipated functional outcomes upon discharge from inpatient rehab are n/a  with PT, n/a with OT, n/a with SLP. 8. Estimated rehab length of stay to reach the above functional goals is: NA 9. Does the patient have adequate social supports to accommodate these discharge functional goals? Potentially 10. Anticipated D/C setting: Home 11. Anticipated post D/C treatments: Outpatient therapy 12. Overall Rehab/Functional Prognosis: good  RECOMMENDATIONS: This patient's condition is appropriate for continued rehabilitative care in the following setting: Home with oupt if 24/7 sup available otherwise SNF Patient has agreed to participate in recommended program. N/A Note that insurance prior authorization may be required for reimbursement for recommended care.  Comment: Mainly needs speech therapy and some supervision for mobility. With a Wernicke's aphasia do not think he will have a receptive ability to make adequate progress at CIR level    09/08/2014

## 2014-09-09 LAB — GLUCOSE, CAPILLARY
GLUCOSE-CAPILLARY: 125 mg/dL — AB (ref 70–99)
Glucose-Capillary: 128 mg/dL — ABNORMAL HIGH (ref 70–99)
Glucose-Capillary: 128 mg/dL — ABNORMAL HIGH (ref 70–99)
Glucose-Capillary: 126 mg/dL — ABNORMAL HIGH (ref 70–99)

## 2014-09-09 LAB — COMPLEMENT, TOTAL: Compl, Total (CH50): 60 U/mL (ref 31–60)

## 2014-09-09 MED ORDER — ASPIRIN 325 MG PO TABS
325.0000 mg | ORAL_TABLET | Freq: Every day | ORAL | Status: DC
  Administered 2014-09-10: 325 mg via ORAL
  Filled 2014-09-09: qty 1

## 2014-09-09 MED ORDER — CARVEDILOL 12.5 MG PO TABS
12.5000 mg | ORAL_TABLET | Freq: Two times a day (BID) | ORAL | Status: DC
  Administered 2014-09-09 – 2014-09-10 (×2): 12.5 mg via ORAL
  Filled 2014-09-09 (×2): qty 1

## 2014-09-09 NOTE — Progress Notes (Signed)
Patient Profile: 58 y.o. female with a past medical history significant for HTN, DM type 2, sickle cell trait, OA admitted for CVA. TEE negative for thrombus but demonstrated newly diagnosed cardiomyopathy with global hypokinesis. EF 30-40%.   Subjective: No complaints. Denies CP. Speech mildly improved.   Objective: Vital signs in last 24 hours: Temp:  [98.1 F (36.7 C)-98.4 F (36.9 C)] 98.4 F (36.9 C) (10/16 0619) Pulse Rate:  [92-110] 105 (10/16 0814) Resp:  [16-18] 17 (10/16 0619) BP: (115-137)/(57-95) 120/64 mmHg (10/16 0814) SpO2:  [99 %] 99 % (10/16 0619) Last BM Date: 09/03/14  Intake/Output from previous day: 10/15 0701 - 10/16 0700 In: 240 [P.O.:240] Out: 500 [Urine:500] Intake/Output this shift:    Medications Current Facility-Administered Medications  Medication Dose Route Frequency Provider Last Rate Last Dose  . acetaminophen (TYLENOL) tablet 650 mg  650 mg Oral Q4H PRN Nishant Dhungel, MD      . ALPRAZolam Duanne Moron) tablet 2 mg  2 mg Oral BID PRN Berle Mull, MD      . aspirin EC tablet 81 mg  81 mg Oral Daily Amie Portland, MD   81 mg at 09/08/14 0910  . atorvastatin (LIPITOR) tablet 20 mg  20 mg Oral q1800 Nishant Dhungel, MD   20 mg at 09/08/14 1739  . carvedilol (COREG) tablet 6.25 mg  6.25 mg Oral BID WC Pixie Casino, MD   6.25 mg at 09/09/14 9381  . cefTRIAXone (ROCEPHIN) 1 g in dextrose 5 % 50 mL IVPB  1 g Intravenous Q24H Belkys A Regalado, MD   1 g at 09/08/14 1351  . feeding supplement (GLUCERNA SHAKE) (GLUCERNA SHAKE) liquid 237 mL  237 mL Oral BID BM Rogue Bussing, RD   237 mL at 09/08/14 1351  . heparin injection 5,000 Units  5,000 Units Subcutaneous 3 times per day Berle Mull, MD   5,000 Units at 09/08/14 1351  . insulin aspart (novoLOG) injection 0-15 Units  0-15 Units Subcutaneous TID WC Nishant Dhungel, MD   2 Units at 09/09/14 0649  . insulin aspart (novoLOG) injection 0-5 Units  0-5 Units Subcutaneous QHS Nishant Dhungel, MD       . lisinopril (PRINIVIL,ZESTRIL) tablet 10 mg  10 mg Oral Daily Berle Mull, MD   10 mg at 09/08/14 0915  . metoCLOPramide (REGLAN) tablet 10 mg  10 mg Oral TID AC & HS Berle Mull, MD   10 mg at 09/09/14 0814  . ondansetron (ZOFRAN-ODT) disintegrating tablet 8 mg  8 mg Oral Q8H PRN Berle Mull, MD      . polyethylene glycol (MIRALAX / GLYCOLAX) packet 17 g  17 g Oral Daily Belkys A Regalado, MD   17 g at 09/07/14 2000  . senna-docusate (Senokot-S) tablet 1 tablet  1 tablet Oral QHS PRN Berle Mull, MD        PE: General appearance: alert, cooperative, appears older than stated age, cachectic and no distress Lungs: clear to auscultation bilaterally Heart: regular rate and rhythm, S1, S2 normal, no murmur, click, rub or gallop Extremities: frail. No LEE Pulses: 2+ and symmetric Skin: warm and dry Neurologic: Grossly normal  Lab Results:   Recent Labs  09/07/14 1215 09/08/14 0548  WBC 5.9 5.7  HGB 12.0 11.3*  HCT 35.5* 33.7*  PLT 312 281   BMET  Recent Labs  09/08/14 0548  NA 138  K 3.9  CL 103  CO2 21  GLUCOSE 139*  BUN 9  CREATININE 0.53  CALCIUM 9.4  PT/INR  Cholesterol No results found for this basename: CHOL,  in the last 72 hours  Assessment/Plan    Principal Problem:   CVA (cerebral infarction) Active Problems:   SICKLE CELL TRAIT   Anxiety state   Diabetes mellitus   HTN (hypertension)   Secondary cardiomyopathy   Urinary retention  1. Cardiomyopathy: EF 30-35% by TEE. Given the recent stroke, catheterization is relatively contraindicated.  Lexiscan NST:  1. Anteroseptal and inferior wall scar. Potential inducible ischemia  in a small region of the distal anterior wall.  2. Global hypokinesis and near akinesis of the septum.  3. Left ventricular ejection fraction 37%  4. Intermediate-risk stress test findings.  Continue ACE-I (lisinopril) and BB (Coreg). She has had some periods of sinus tach on telemetry. Will increase Coreg to 12.5  mg BID. Her volume status appears stable. Continue ASA.   2. CVA: TEE negative for thrombus. No Afib on tele. Neurologic deficits improving with notable improvement with speech. On ASA and statin. Without documented atrial fibrillation/ thrombus, use of coumadin in this setting (reduced EF and likely thromboembolic CVA) is suggested at some institutions although the supporting data are limited. Family is hesitant about using coumadin. I would support the decision to proceed with warfarin if family is willing to proceed. Bleeding risk would need to be weighed.   3. HTN: BP well controlled.    LOS: 5 days   Candee Furbish, MD

## 2014-09-09 NOTE — Progress Notes (Signed)
[facility] PROGRESS NOTE  Kristen Lozano FYB:[id number] DOB: 08/27/1956 DOA: 09/04/2014 PCP: Elyn Peers, MD   Brief narrative 58 y.o. female with past medical history of hypertension, depression,  diabetes mellitus with  Possible gastroparesis  presented with complaints of aphasia. She was last seen and normal about 6 hours prior to being found by daughter to be non verbal. As per daughter,  Patient was confused and responding to every question with "yes".  EMS  noticed a facial droopon the right. Head CT on admission showing a left perforator infarct. Admitted for further stroke w/up. Patient was out of therapeutic tPA window so did not receive tPA.   Assessment/Plan: Acute left sided stroke  -CT head shows left perforator infarct involving large area of left MCA distrubution. - MRI brain, MRA head shows diffuse left sided infarct as outlined below. -2D echo shows EF of 35-40% with global hypokinesis , possible recent ischemic heart disease. -carotid dopplers with non significant stenosis. -PT/OT eval recommends HHPT, SNF ?  -continue daily aspirin. LDL of 75. On statin -hypercoagulable and vasculitis w/up sent : ESR normal, SSA negative, Myeloperoxide antibodies negative, ANCA negative, RPR non reactive, HIV non reactive, anticardiolipin low, antithrombin III negative, Factor V leiden, lupus anticoagulant, Protein S, C  , B 2 globulin negative, Complement pending.  -discussed with family regarding neurologist recommendation, coumadin for secondary stroke prevention. -discussed with husband and sister, they prefer aspirin for now. Explain to them that coumadin was better option.   Abnormal renal finding on CT scan:  -Bilateral regions of abnormal low-density in both kidneys, without cortical rim sign. Appearance favors bilateral  pyelonephritis. There may be incipient abscess formation in the left mid to upper kidney on image 11 of series 7. Renal infarcts right less likely  differential diagnostic consideration given the appearance. -urine culture no growth.  -bladder distension on CT. Foley placed. Voiding trial today, if patient continue to have residual of more than 300-400 will need to insert foley catheter.  -Urology consulted. Discussed case with Dr Dalthted. CT finding probably related to infarct.  -discontinue ceftriaxone, urine culture negative.  -discussed with family, patient will benefit from coumadin. They still continue to refuse. They will follow with PCP if they decide to go with that option.   Mildly dilated CBD; LFT normal.   DM type 2 Monitor on SSI.  A1C of 6.2  Cardiomyopathy  possibly ischemic Cardiology consulted per neurologist request.  Started on coreg and lisinopril.  Lexiscan: 1. Anteroseptal and inferior wall scar. Potential inducible ischemia in a small region of the distal anterior wall. Global hypokinesis and near akinesis of the septum. Left ventricular ejection fraction 37% . Intermediate-risk stress test findings. Medical management.  Coreg dose increase today.   HTN  stable  malnutrition  suppleemnts  Code Status: FULL  Family Communication: Husband, sister   at bedside Disposition Plan: possible d/c home tomorrow.    Consultants:  Stroke team   urology  Procedures:  MRI brain  2D echo carotid doppler  Antibiotics:  Ceftriaxone 10-14--10-16  HPI/Subjective: Patient with word finding difficulty. No complaints.    Objective: Filed Vitals:   09/09/14 1337  BP: 106/57  Pulse: 109  Temp: 98.1 F (36.7 C)  Resp: 20    Intake/Output Summary (Last 24 hours) at 09/09/14 1341 Last data filed at 09/09/14 5277  Gross per 24 hour  Intake    240 ml  Output    500 ml  Net   -260 ml   Filed  Weights   09/05/14 0204  Weight: 45.813 kg (101 lb)    Exam:   General: NAD,   HEETN: rt facial. Droop,  moist mucosa  Cardiovascular: S1&S2 normal  no muurmurs  Respiratory: clear b/l  Abdomen:  soft, NT, ND,   Musculoskeletal:warm, no edema  CNS: alert and awake, oriented 1x2, has word finding difficulty, rt facial droop. Good strength b/l   Data Reviewed: Basic Metabolic Panel:  Recent Labs Lab 09/04/14 2240 09/05/14 0705 09/08/14 0548  NA 139 137 138  K 4.0 4.0 3.9  CL 98 102 103  CO2 _0 GLUCOSE 126* 105* 139*  BUN _1 CREATININE 0.60 0.54 0.53  CALCIUM 10.0 9.2 9.4   Liver Function Tests:  Recent Labs Lab 09/04/14 2240 09/05/14 0705  AST 15 14  ALT 8 7  ALKPHOS 49 44  BILITOT 0.8 0.8  PROT 8.0 7.0  ALBUMIN 3.5 3.1*   No results found for this basename: LIPASE, AMYLASE,  in the last 168 hours  Recent Labs Lab 09/04/14 2240  AMMONIA 20   CBC:  Recent Labs Lab 09/04/14 2240 09/05/14 0705 09/07/14 1215 09/08/14 0548  WBC 7.6 6.1 5.9 5.7  NEUTROABS 4.3 3.4  --   --   HGB 13.0 12.1 12.0 11.3*  HCT 39.5 35.3* 35.5* 33.7*  MCV 66.7* 64.8* 64.9* 65.4*  PLT 350 295 312 281   Cardiac Enzymes: No results found for this basename: CKTOTAL, CKMB, CKMBINDEX, TROPONINI,  in the last 168 hours BNP (last 3 results) No results found for this basename: PROBNP,  in the last 8760 hours CBG:  Recent Labs Lab 09/08/14 1454 09/08/14 1643 09/08/14 2205 09/09/14 0621 09/09/14 1115  GLUCAP 153* 134* 118* 125* 128*    Recent Results (from the past 240 hour(s))  URINE CULTURE     Status: None   Collection Time    09/07/14  2:05 PM      Result Value Ref Range Status   Specimen Description URINE, CATHETERIZED   Final   Special Requests NONE   Final   Culture  Setup Time     Final   Value: 09/07/2014 20:56     Performed at [facility]     Final   Value: NO GROWTH     Performed at [facility]   Culture     Final   Value: NO GROWTH     Performed at [facility]   Report Status 09/08/2014 FINAL   Final     Studies: Nm Myocar Multi W/spect W/wall Motion / Ef  09/08/2014   CLINICAL DATA:  Chest  pain and history of cardiomyopathy, hypertension, diabetes and cerebral infarction.  EXAM: MYOCARDIAL IMAGING WITH SPECT (REST AND PHARMACOLOGIC-STRESS)  GATED LEFT VENTRICULAR WALL MOTION STUDY  LEFT VENTRICULAR EJECTION FRACTION  TECHNIQUE: Standard myocardial SPECT imaging was performed after resting intravenous injection of 10 mCi Tc-32msestamibi. Subsequently, intravenous infusion of Lexiscan was performed under the supervision of the Cardiology staff. At peak effect of the drug, 30 mCi Tc-930mestamibi was injected intravenously and standard myocardial SPECT imaging was performed. Quantitative gated imaging was also performed to evaluate left ventricular wall motion, and estimate left ventricular ejection fraction.  COMPARISON:  None  FINDINGS: Perfusion: There is evidence of scar involving the mid to distal anteroseptal wall and the mid to basilar inferior wall. There is potentially a small region of distal anterior wall ischemia.  Wall Motion: Global hypokinesis  and nearly akinetic septum.  Left Ventricular Ejection Fraction: 37 %  End diastolic volume 073 ml  End systolic volume 64 ml  IMPRESSION: 1. Anteroseptal and inferior wall scar. Potential inducible ischemia in a small region of the distal anterior wall.  2. Global hypokinesis and near akinesis of the septum.  3. Left ventricular ejection fraction 37%  4. Intermediate-risk stress test findings*.  *2012 Appropriate Use Criteria for Coronary Revascularization Focused Update: J Am Coll Cardiol. 7106;26(9):485-462. http://content.airportbarriers.com.aspx?articleid=1201161   Electronically Signed   By: Aletta Edouard M.D.   On: 09/08/2014 16:13    Scheduled Meds: . aspirin EC  81 mg Oral Daily  . atorvastatin  20 mg Oral q1800  . carvedilol  12.5 mg Oral BID WC  . feeding supplement (GLUCERNA SHAKE)  237 mL Oral BID BM  . heparin  5,000 Units Subcutaneous 3 times per day  . insulin aspart  0-15 Units Subcutaneous TID WC  . insulin aspart   0-5 Units Subcutaneous QHS  . lisinopril  10 mg Oral Daily  . metoCLOPramide  10 mg Oral TID AC & HS  . polyethylene glycol  17 g Oral Daily   Continuous Infusions:      Time spent: 25 minutes    Kristen Lozano, [facility] Pager [phone]. If 7PM-7AM, please contact night-coverage at [email or web address], password [facility] 09/09/2014, 1:41 PM  LOS: 5 days

## 2014-09-09 NOTE — Clinical Social Work Psychosocial (Signed)
Clinical Social Work Department BRIEF PSYCHOSOCIAL ASSESSMENT 09/09/2014  Patient:  Kristen Lozano, Kristen Lozano     Account Number:  [id number]     Admit date:  09/04/2014  Clinical Social Worker:  Glendon Axe, CLINICAL SOCIAL WORKER  Date/Time:  09/09/2014 01:45 PM  Referred by:  Physician  Date Referred:  09/08/2014 Referred for  SNF Placement   Other Referral:   Interview type:  Other - See comment Other interview type:   Clinical Social Worker spoke with pt and pt's husband and pt's sister at bedside.    PSYCHOSOCIAL DATA Living Status:  HUSBAND Admitted from facility:   Level of care:   Primary support name:  Kristen Lozano Primary support relationship to patient:  SPOUSE Degree of support available:   Strong.    CURRENT CONCERNS Current Concerns  Post-Acute Placement   Other Concerns:    SOCIAL WORK ASSESSMENT / Musician met with pt and pt's husband and pt's sister in reference to post-acute placement. CSW explained SNF process with an option for Home Health. CSW provided pt and pt's husband with SNF list and bed offers. Pt's husband reviewed SNF list and reported wanting to have pt placed close to the hospital [facility]). Pt and pt's husband later reported a request for Home Health vs SNF. CSW explained the difference between SNF and Home Health. Pt and pt's husband decided on [facility]. RNCM notified of [place] request. CSW siging off but remains available as needed.   Assessment/plan status:  No Further Intervention Required Other assessment/ plan:   Information/referral to community resources:   Pt and pt's family provided with SNF list and bed offers.    PATIENT'S/FAMILY'S RESPONSE TO PLAN OF CARE: Pt lying in bed, alert and disoriented. Pt also sat up during interview without assistance. Pt and pt's husband requesting Home Health vs SNF. Pt and pt's family refusing SNF placement. RNCM notified.     Glendon Axe, MSW, LCSWA [phone] 09/09/2014 1:58 PM

## 2014-09-09 NOTE — Progress Notes (Signed)
CARE MANAGEMENT NOTE 09/09/2014  Patient:  Kristen Lozano, Kristen Lozano   Account Number:  [id number]  Date Initiated:  09/05/2014  Documentation initiated by:  Bianey Tesoro  Subjective/Objective Assessment:   Patient was admitted with CVA.  Lives at home with spouse     Action/Plan:   Will follow for discharge needs pending PT/OT evals and physician orders.   Anticipated DC Date:  09/09/2014   Anticipated DC Plan:  [place]  CM consult      Choice offered to / List presented to:          [facility] arranged  HH-1 RN  [place]      [facility].   Status of service:  Completed, signed off Medicare Important Message given?  YES (If response is "NO", the following Medicare IM given date fields will be blank) Date Medicare IM given:  09/07/2014 Medicare IM given by:  Lorne Skeens Date Additional Medicare IM given:   Additional Medicare IM given by:    Discharge Disposition:    Per UR Regulation:  Reviewed for med. necessity/level of care/duration of stay  If discussed at [place] of Stay Meetings, dates discussed:    Comments:  09/09/14 [place], MSN, CM- Home health orders received. Amy with [facility] notified and accepted the referral for discharge home today or tomorrow.     09/09/14 [place], MSN, CM- Met with patient, husband and patient's sister to discuss discharge planning, per their request.  Patient is declining any post-hospitalization and is requesting [facility] services.  Family is in agreement, but is concerned about having care in the home while her husband is working.  CM discussed that home health cannot provide 24 hr care and explained what home health can provide.  Patient was provided with a private duty agency list as well.  Family has chosen to use [facility], which they are familiar with.  Message was sent  to Dr Tyrell Antonio to relay family's request and obtain orders.  Once orders are received, CM will contact [facility].    09/07/14 [place] RN, MSN, CM- Medicare IM letter provided.

## 2014-09-09 NOTE — Clinical Social Work Placement (Addendum)
Clinical Social Work Department CLINICAL SOCIAL WORK PLACEMENT NOTE 09/09/2014  Patient:  Kristen Lozano, Kristen Lozano  Account Number:  [id number] Admit date:  09/04/2014  Clinical Social Worker:  Glendon Axe, CLINICAL SOCIAL WORKER  Date/time:  09/09/2014 01:59 PM  Clinical Social Work is seeking post-discharge placement for this patient at the following level of care:   SKILLED NURSING   (*CSW will update this form in Epic as items are completed)   09/09/2014  Patient/family provided with [place] Department of Clinical Social Work's list of facilities offering this level of care within the geographic area requested by the patient (or if unable, by the patient's family).  09/09/2014  Patient/family informed of their freedom to choose among providers that offer the needed level of care, that participate in Medicare, Medicaid or managed care program needed by the patient, have an available bed and are willing to accept the patient.  09/09/2014  Patient/family informed of [facility]' ownership interest in [facility], as well as of the fact that they are under no obligation to receive care at this facility.  PASARR submitted to EDS on 09/08/2014 PASARR number received on 09/08/2014  FL2 transmitted to all facilities in geographic area requested by pt/family on  09/08/2014 FL2 transmitted to all facilities within larger geographic area on   Patient informed that his/her managed care company has contracts with or will negotiate with  certain facilities, including the following:   YES.     Patient/family informed of bed offers received:  09/09/2014 Patient chooses bed at patient now refusing SNF placement.  Physician recommends and patient chooses bed at    Patient to be transferred to  on   Patient to be transferred to facility by  Patient and family notified of transfer on  Name of family member notified:    The following physician request were entered in  Epic:   Additional Comments: Patient and pt's family declined SNF bed offers. Pt and pt's family requesting Home Health. RNCM notified.     Glendon Axe, MSW, [place] [phone] 09/09/2014 2:04 PM

## 2014-09-09 NOTE — Progress Notes (Signed)
Speech Language Pathology Treatment: Cognitive-Linquistic  Patient Details Name: Kristen Lozano MRN: [id number] DOB: 09-06-1956 Today's Date: 09/09/2014 Time: 1130-1200 SLP Time Calculation (min): 30 min  Assessment / Plan / Recommendation Clinical Impression  SLP provided moderate question, choice, semantic and phonemic cues to elicit naming, requests and functional communcation with a meal. SLP able to extend pts spontaneous efforts to accurate phrase length speech with moderate cueing. Family observing and participated in education regarding cueing and aphasia. Pt demonstrates significant improvement since assessment. Recommend Home Health SLP f/u given pts decision to return home.    HPI HPI: Kristen Lozano is a 58 y.o. female with PMH of hypertension, depression, diabetes mellitus admitted with confusion, language impairment. MRI showed Acute nonhemorrhagic infarct involving the anterior left lentiform nucleus and caudate head as well as acute nonhemmorrhagic infarct involving posterior L temporal and parietal lobe.   Pertinent Vitals Pain Assessment: No/denies pain Faces Pain Scale: No hurt  SLP Plan  Continue with current plan of care    Recommendations                Follow up Recommendations: Home health SLP Plan: Continue with current plan of care    GO    [facility], MA CCC-SLP [phone]  Lynann Beaver 09/09/2014, 1:27 PM

## 2014-09-09 NOTE — Progress Notes (Signed)
Occupational Therapy Treatment Patient Details Name: Kristen Lozano MRN: [id number] DOB: Oct 18, 1956 Today's Date: 09/09/2014    History of present illness 58 y.o. female with PMH significant for hypertension, depression, diabetes mellitus admitted with confusion, language impairment. Imaging positive for CVA. 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    OT comments  Pt is a 58 y/o female admitted as above whom continues to demonstrate deficits in the areas of expressive and receptive aphasia. Pt participated in ADL retraining session today w/ focus on task initiation, object recognition and sequencing of tasks during ADL's while standing at sink. She is easily distracted and will cont to benefit from acute OT to address areas of impairment.  Follow Up Recommendations  SNF;Supervision/Assistance - 24 hour    Equipment Recommendations       Recommendations for Other Services      Precautions / Restrictions Precautions Precautions: Fall Restrictions Weight Bearing Restrictions: No       Mobility Bed Mobility Overal bed mobility:  (Pt sitting up at EOB w/ bed alarm on and family in room upon OT arrival.)                Transfers Overall transfer level: Needs assistance Equipment used: None Transfers: Sit to/from Stand Sit to Stand: Supervision;Min guard         General transfer comment: min guard for safety, pt is impulsive and has difficulty following simple commands    Balance Overall balance assessment: Needs assistance Sitting-balance support: No upper extremity supported;Feet supported Sitting balance-Leahy Scale: Good     Standing balance support: No upper extremity supported;During functional activity Standing balance-Leahy Scale: Good                     ADL  Overall ADL's : Needs assistance/impaired     Grooming: Wash/dry hands;Wash/dry face;Standing;Cueing for safety;Cueing for sequencing;Supervision/safety;Min guard Grooming Details (indicate cue type and reason): Pt requires frequent verbal and visual cues to initiate and complete sequence of ADL tasks.                 Toilet Transfer: Supervision/safety;BSC   Toileting- Water quality scientist and Hygiene: Minimal assistance;Cueing for sequencing;Sit to/from stand Toileting - Clothing Manipulation Details (indicate cue type and reason): Pt sat on 3:1 in bathroom during bathing/ADL's to urinate. Pt was told prior to entering bathroom that she had a catheter but sat on 3:1 to go despite Mod and consistent vc's.     Functional mobility during ADLs: Supervision/safety;Min guard General ADL Comments: Pt stood at sink today for ADL retraining session with focus on task initiation, object recognition and sequencing. She is easily distracted and when asked to wash her face, she lifted her night qown and washed her peri area. She then repeated this 3x's despite redirection to tasks at hand and being given a new wash cloth to groom/bathe with.      Vision                     Perception Perception Perception Tested?: Yes Perception Deficits: Object recognition   Praxis Praxis Praxis tested?: Deficits Deficits: Perseveration    Cognition   Behavior During Therapy: Flat affect Overall Cognitive Status: Impaired/Different from baseline Area of Impairment: Following commands;Safety/judgement;Awareness;Orientation;Attention;Problem solving   Current Attention Level: Sustained    Following Commands: Follows one step commands inconsistently;Follows multi-step  commands inconsistently Safety/Judgement: Decreased awareness of safety;Decreased awareness of deficits Awareness: Intellectual Problem Solving: Decreased initiation;Difficulty sequencing;Requires verbal cues;Requires tactile  cues General Comments: Pt easily distracted by people coming in and out of the room during sink level adl task. Pt follows commands inconsistently and has receptive and expressive language deficits. Pt is also impulsive and has decreased awareness of deficits which limits safety with adls and mobility    Extremity/Trunk Assessment               Exercises     Shoulder Instructions       General Comments      Pertinent Vitals/ Pain       Pain Assessment: No/denies pain Faces Pain Scale: No hurt  Home Living                                          Prior Functioning/Environment              Frequency Min 2X/week     Progress Toward Goals  OT Goals(current goals can now be found in the care plan section)  Progress towards OT goals: Progressing toward goals  Acute Rehab OT Goals Patient Stated Goal: none stated  Plan Discharge plan remains appropriate    Co-evaluation                 End of Session Equipment Utilized During Treatment: Gait belt   Activity Tolerance Patient tolerated treatment well   Patient Left in bed;with call bell/phone within reach;with bed alarm set;with family/visitor present   Nurse Communication Mobility status;Precautions        Time: 7893-8101 OT Time Calculation (min): 23 min  Charges: OT General Charges $OT Visit: 1 Procedure OT Treatments $Self Care/Home Management : 23-37 mins (23 min)  Carlynn Herald, Suzana Sohail Beth Dixon 09/09/2014, 11:45 AM

## 2014-09-09 NOTE — Progress Notes (Signed)
Physical Therapy Treatment Patient Details Name: Kristen Lozano MRN: [id number] DOB: 03/06/1956 Today's Date: 09/09/2014    History of Present Illness 58 y.o. female with PMH significant for hypertension, depression, diabetes mellitus admitted with confusion, language impairment. Imaging positive for CVA. 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     PT Comments    Patient's mobility is close to baseline, however needs 24/7 assist due to cognitive deficits including decreased awareness of deficits, decreased safety and decreased problem solving.  Worked today on mobility while naming items in hallway picture and color ID with assist needed 75% of the time.  Follow Up Recommendations  Home health PT;Supervision/Assistance - 24 hour     Equipment Recommendations  None recommended by PT    Recommendations for Other Services       Precautions / Restrictions Precautions Precautions: Fall    Mobility  Bed Mobility Overal bed mobility:  (Pt sitting up at EOB w/ bed alarm on and family in room upon OT arrival.)             General bed mobility comments: sitting on EOB after back from bathroom with nurse tech  Transfers Overall transfer level: Needs assistance Equipment used: None Transfers: Sit to/from Stand Sit to Stand: Supervision         General transfer comment: assist with safety due to foley  Ambulation/Gait Ambulation/Gait assistance: Supervision Ambulation Distance (Feet): 400 Feet Assistive device: None Gait Pattern/deviations: Step-through pattern     General Gait Details:  easily distracted by environment, no LOB with head turns today   Stairs            Wheelchair Mobility    Modified Rankin (Stroke Patients Only) Modified Rankin (Stroke Patients  Only) Pre-Morbid Rankin Score: No symptoms Modified Rankin: Moderately severe disability     Balance Overall balance assessment: Needs assistance Sitting-balance support: No upper extremity supported;Feet supported Sitting balance-Leahy Scale: Good     Standing balance support: No upper extremity supported;During functional activity Standing balance-Leahy Scale: Good                      Cognition Arousal/Alertness: Awake/alert Behavior During Therapy: Flat affect Overall Cognitive Status: Impaired/Different from baseline Area of Impairment: Following commands;Safety/judgement;Awareness;Problem solving   Current Attention Level: Sustained   Following Commands: Follows one step commands inconsistently Safety/Judgement: Decreased awareness of safety;Decreased awareness of deficits Awareness: Intellectual Problem Solving: Requires verbal cues;Requires tactile cues General Comments: Easily distracted walking in hallway turning to look into rooms and at staff in hallway.  Worked on naming items in pictures throughout hallway, naming colors, reading signs, etc for cognitive/language skills.    Exercises      General Comments        Pertinent Vitals/Pain Pain Assessment: No/denies pain Faces Pain Scale: No hurt    Home Living                      Prior Function            PT Goals (current goals can now be found in the care plan section) Acute Rehab PT Goals Patient Stated Goal: none stated Progress towards PT goals: Progressing toward goals    Frequency  Min 4X/week    PT Plan Discharge plan needs to be updated    Co-evaluation  End of Session   Activity Tolerance: Patient tolerated treatment well Patient left: in chair;with chair alarm set     Time: 1400-1419 PT Time Calculation (min): 19 min  Charges:  $Gait Training: 8-22 mins                    G Codes:      WYNN,CYNDI 09/12/14, 3:30 PM Magda Kiel,  [place] 09/12/14

## 2014-09-10 LAB — BASIC METABOLIC PANEL
ANION GAP: 12 (ref 5–15)
BUN: 11 mg/dL (ref 6–23)
CHLORIDE: 99 meq/L (ref 96–112)
CO2: 24 mEq/L (ref 19–32)
Calcium: 9.6 mg/dL (ref 8.4–10.5)
Creatinine, Ser: 0.51 mg/dL (ref 0.50–1.10)
GFR calc Af Amer: >90 mL/min (ref >90)
Glucose, Bld: 149 mg/dL — ABNORMAL HIGH (ref 70–99)
Potassium: 4.2 mEq/L (ref 3.7–5.3)
SODIUM: 135 meq/L — AB (ref 137–147)

## 2014-09-10 LAB — GLUCOSE, CAPILLARY
GLUCOSE-CAPILLARY: 162 mg/dL — AB (ref 70–99)
Glucose-Capillary: 154 mg/dL — ABNORMAL HIGH (ref 70–99)

## 2014-09-10 MED ORDER — POLYETHYLENE GLYCOL 3350 17 G PO PACK: 17.0000 g | PACK | Freq: Every day | ORAL | Status: DC

## 2014-09-10 MED ORDER — ATORVASTATIN CALCIUM 20 MG PO TABS: 20.0000 mg | ORAL_TABLET | Freq: Every day | ORAL | Status: DC

## 2014-09-10 MED ORDER — CARVEDILOL 12.5 MG PO TABS: 12.5000 mg | ORAL_TABLET | Freq: Two times a day (BID) | ORAL | Status: DC

## 2014-09-10 MED ORDER — GLUCERNA SHAKE PO LIQD: 237.0000 mL | Freq: Three times a day (TID) | ORAL | Status: DC

## 2014-09-10 MED ORDER — ASPIRIN 325 MG PO TABS: 325.0000 mg | ORAL_TABLET | Freq: Every day | ORAL | Status: DC

## 2014-09-10 MED ORDER — INSULIN GLARGINE 100 UNIT/ML SOLOSTAR PEN: 4.0000 [IU] | PEN_INJECTOR | Freq: Every evening | SUBCUTANEOUS | Status: DC

## 2014-09-10 NOTE — Discharge Summary (Addendum)
Physician Discharge Summary  Kristen Lozano VZD:[id number] DOB: [birth year] DOA: 09/04/2014  PCP: Kristen Peers, MD  Admit date: 09/04/2014 Discharge date: 09/10/2014  Time spent: 35 minutes  Recommendations for Outpatient Follow-up:  1. Follow up with cardio for further titration of medications.  2. Follow up with PCP , patient might benefit from coumadin in setting of stroke and probably renal infarct.  3. Needs to follow up with urology for bladder retention and further urodynamic studies.  4. Might need loop recorder, to be evaluated by cardiology.   Discharge Diagnoses:    CVA (cerebral infarction)   Possible renal infarct.    Cardiomyopathy Ef 37 %   SICKLE CELL TRAIT   Anxiety state   Diabetes mellitus   HTN (hypertension)   Secondary cardiomyopathy   Urinary retention   Discharge Condition: Stable.  Diet recommendation: Heart Healthy  Filed Weights   09/05/14 0204  Weight: 45.813 kg (101 lb)    History of present illness:  Kristen Lozano is a 58 y.o. female with Past medical history of hypertension, depression, specifically, diabetes mellitus.  The patient presented with complaints of aphasia. She was last seen and normal at around 1 PM. The history was obtained from patient's daughter as the patient has been nonspeaking. In the evening at around 6 PM when the daughter came to take a look at the patient she was confused and responding to every question with "yes". As per the EMS the noticed a facial droopiness on right.  No further abnormalities has been reported by daughter.  Patient did not have any recent chest pain, fever, cough, abdominal pain.  She presented with nausea vomiting with concern for gastroparesis on 08/11/2014.  Her blood sugar has been running in the range of 100-120.  Patient has been compliant with all her medications.   Hospital Course:  Brief narrative  58 y.o. female with past medical history of hypertension, depression, diabetes mellitus  with Possible gastroparesis presented with complaints of aphasia. She was last seen and normal about 6 hours prior to being found by daughter to be non verbal. As per daughter, Patient was confused and responding to every question with "yes". EMS noticed a facial droopon the right. Head CT on admission showing a left perforator infarct. Admitted for further stroke w/up. Patient was out of therapeutic tPA window so did not receive tPA.  Assessment/Plan:  Acute left sided stroke  -CT head shows left perforator infarct involving large area of left MCA distrubution.  - MRI brain, MRA head shows diffuse left sided infarct as outlined below.  -2D echo shows EF of 35-40% with global hypokinesis , possible recent ischemic heart disease.  -carotid dopplers with non significant stenosis.  -PT/OT eval recommends HHPT, SNF ?  -continue daily aspirin. LDL of 75. On statin  -hypercoagulable and vasculitis w/up sent : ESR normal, SSA negative, Myeloperoxide antibodies negative, ANCA negative, RPR non reactive, HIV non reactive, anticardiolipin low, antithrombin III negative, Factor V leiden, lupus anticoagulant, Protein S, C , B 2 globulin negative, Complement pending.  -discussed with family regarding neurologist recommendation, coumadin for secondary stroke prevention.  -discussed with husband and sister, they prefer aspirin for now. Explain to them that coumadin was better option.   Probably Renal infarct:  -Bilateral regions of abnormal low-density in both kidneys, without cortical rim sign. Appearance favors bilateral pyelonephritis. There may be incipient abscess formation in the left mid to upper kidney on image 11 of series 7. Renal infarcts right less likely differential  diagnostic consideration given the appearance.  -urine culture no growth.  -bladder distension on CT. Foley placed. Voiding trial today, if patient continue to have residual of more than 300-400 will need to insert foley catheter.   -Urology consulted. Discussed case with Dr Dalthted. CT finding probably related to infarct.  -discontinue ceftriaxone, urine culture negative.  -Discussed with family, patient will benefit from coumadin. They still continue to refuse. They will follow with PCP if they decide to go with that option.   Mildly dilated CBD; LFT normal.   DM type 2  Monitor on SSI. A1C of 6.2   Cardiomyopathy  possibly ischemic  Cardiology consulted. Started on coreg and lisinopril.  Lexiscan: 1. Anteroseptal and inferior wall scar. Potential inducible ischemia in a small region of the distal anterior wall. Global hypokinesis and near akinesis of the septum. Left ventricular ejection fraction 37% . Intermediate-risk stress test findings.  Medical management.  Tolerating increase dose of coreg.   HTN  stable   malnutrition  suppleemnts   Consultations:  Neurology  Urology  cardiology  Discharge Exam: Filed Vitals:   09/10/14 1006  BP: 103/52  Pulse: 96  Temp: 98.6 F (37 C)  Resp: 16    General: Alert in no distress.  Cardiovascular: S 1, S 2 RRR Respiratory: CTA  Discharge Instructions You were cared for by a hospitalist during your hospital stay. If you have any questions about your discharge medications or the care you received while you were in the hospital after you are discharged, you can call the unit and asked to speak with the hospitalist on call if the hospitalist that took care of you is not available. Once you are discharged, your primary care physician will handle any further medical issues. Please note that NO REFILLS for any discharge medications will be authorized once you are discharged, as it is imperative that you return to your primary care physician (or establish a relationship with a primary care physician if you do not have one) for your aftercare needs so that they can reassess your need for medications and monitor your lab values.  Discharge Instructions   Diet  Carb Modified    Complete by:  As directed      Increase activity slowly    Complete by:  As directed           Current Discharge Medication List    START taking these medications   Details  aspirin 325 MG tablet Take 1 tablet (325 mg total) by mouth daily. Qty: 30 tablet, Refills: 0    atorvastatin (LIPITOR) 20 MG tablet Take 1 tablet (20 mg total) by mouth daily at 6 PM. Qty: 30 tablet, Refills: 0    carvedilol (COREG) 12.5 MG tablet Take 1 tablet (12.5 mg total) by mouth 2 (two) times daily with a meal. Qty: 60 tablet, Refills: 0    feeding supplement, GLUCERNA SHAKE, (GLUCERNA SHAKE) LIQD Take 237 mLs by mouth 3 (three) times daily with meals. Qty: 30 Can, Refills: 2    polyethylene glycol (MIRALAX / GLYCOLAX) packet Take 17 g by mouth daily. Qty: 14 each, Refills: 0      CONTINUE these medications which have CHANGED   Details  Insulin Glargine (LANTUS SOLOSTAR) 100 UNIT/ML Solostar Pen Inject 4 Units into the skin every evening. Qty: 15 mL, Refills: 11      CONTINUE these medications which have NOT CHANGED   Details  alprazolam (XANAX) 2 MG tablet Take 2 mg by mouth 3 (  three) times daily as needed for anxiety.    lisinopril (PRINIVIL,ZESTRIL) 10 MG tablet Take 10 mg by mouth daily.    metFORMIN (GLUCOPHAGE) 1000 MG tablet Take 1,000 mg by mouth 2 (two) times daily as needed (when insulin is not available).     metoCLOPramide (REGLAN) 10 MG tablet Take 1 tablet (10 mg total) by mouth 4 (four) times daily -  before meals and at bedtime. Qty: 30 tablet, Refills: 0    ondansetron (ZOFRAN ODT) 8 MG disintegrating tablet Take 1 tablet (8 mg total) by mouth every 8 (eight) hours as needed for nausea or vomiting. Qty: 20 tablet, Refills: 0    oxyCODONE-acetaminophen (PERCOCET) 10-325 MG per tablet Take 1 tablet by mouth 3 (three) times daily as needed. For pain      STOP taking these medications     insulin aspart protamine- aspart (NOVOLOG MIX 70/30) (70-30) 100  UNIT/ML injection      meloxicam (MOBIC) 15 MG tablet        No Known Allergies Follow-up Information   Follow up with Kristen Peers, MD In 1 week.   Specialty:  Family Medicine   Contact information:   [address] [place] [address] [phone]       Follow up with Xu,Jindong, MD In 2 weeks.   Specialty:  Neurology   Contact information:   [address] [place] [address] [phone]       Follow up with Jorja Loa, MD. Call in 2 days.   Specialty:  Urology   Contact information:   [facility] [address] [phone]       Follow up with Candee Furbish, MD In 1 week.   Specialty:  Cardiology   Contact information:   [address] [place] [address] [phone]        The results of significant diagnostics from this hospitalization (including imaging, microbiology, ancillary and laboratory) are listed below for reference.    Significant Diagnostic Studies: Ct Head Wo Contrast  09/05/2014   CLINICAL DATA:  Altered mental status, aphasia and right facial droop, onset today. Initial encounter.  EXAM: CT HEAD WITHOUT CONTRAST  TECHNIQUE: Contiguous axial images were obtained from the base of the skull through the vertex without intravenous contrast.  COMPARISON:  None.  FINDINGS: Skull and Sinuses:Negative for fracture or destructive process. The mastoids, middle ears, and imaged paranasal sinuses are clear.  Orbits: No acute abnormality.  Brain: Wedge of low-attenuation extending from the lower left sylvian fissure through the putamen, anterior limb internal capsule, and into the left caudate head. Given the clinical circumstances, this is likely an acute perforator infarct. 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 There is no hemorrhage. No hydrocephalus, mass lesion, or shift.  These results were called by telephone at the time of interpretation on 09/04/2014 at 11:58 pm to  Dr. Sol Passer , who verbally acknowledged these results.  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   Electronically Signed   By: Jorje Guild M.D.   On: 09/05/2014 00:03   Ct Chest W Contrast  09/07/2014   CLINICAL DATA:  Unintentional weight loss.  Acute left CVA.  EXAM: CT CHEST, ABDOMEN, AND PELVIS WITH CONTRAST  TECHNIQUE: Multidetector CT imaging of the chest, abdomen and pelvis was performed following the standard protocol during bolus administration of intravenous contrast.  CONTRAST:  80 cc Omnipaque 300  COMPARISON:  09/04/2014 chest radiograph  FINDINGS: CT CHEST FINDINGS  Right lower paratracheal lymph node upper normal sized at 9 mm. No additional adenopathy in the chest.  Minimal biapical pleural parenchymal scarring. The lungs appear otherwise clear.  CT ABDOMEN AND PELVIS FINDINGS  Hepatobiliary: Punctate 4 mm calcification in segment 2 of the liver, without surrounding abnormal enhancement. Central common bile duct appears dilated at 0.9 cm, with subtle intrahepatic biliary dilatation. Difficult to differentiate the CBD and proximal pancreatic duct as they approach the ampulla ; there may be a small stenotic segment of the distal CBD or the distal CBD may be completely tapered with a dilated segment of the dorsal pancreatic duct in the ampulla.  Pancreas: Possible dilated segment of the dorsal pancreatic duct near the ampulla as noted above. No differential enhancing mass in the pancreatic head or elsewhere in the pancreas.  Spleen: Intact  Adrenals/Urinary Tract: Abnormal peripheral regions of hypoenhancement scattered in the parenchyma of both kidneys, without enhancing capsular margins. No hydronephrosis. Distended urinary bladder, bladder a volume currently 1 L.  Stomach/Bowel: Unremarkable  Vascular/Lymphatic: Unremarkable  Reproductive: Unremarkable  Other: No supplemental non-categorized findings.  Musculoskeletal: Unremarkable   IMPRESSION: 1. Bilateral regions of abnormal low-density in both kidneys, without cortical rim sign. Appearance favors bilateral pyelonephritis. There may be incipient abscess formation in the left mid to upper kidney on image 11 of series 7. Renal infarcts right less likely differential diagnostic consideration given the appearance. 2. Mildly dilated CBD with an unusual appearance distally where there is narrowing of a segment of the CBD and either subsequent distal dilatation or a small dilated segment of the dorsal pancreatic duct. No differential enhancement in the pancreatic tissue is observed to suggest a neoplasm, although today's exam was protocol does a standard CT abdomen over rather than a pancreatic protocol exam. 3. Distended urinary bladder, significance uncertain, possibly simply incidental. 4. No worrisome findings in the chest.   Electronically Signed   By: Sherryl Barters M.D.   On: 09/07/2014 07:48   Mr Jodene Nam Head Wo Contrast  09/05/2014   CLINICAL DATA:  A aphasia beginning at 1 o\'clock p.m. yesterday. Confusion. Right-sided facial droop. Initial encounter.  EXAM: MRI HEAD WITHOUT CONTRAST  MRA HEAD WITHOUT CONTRAST  TECHNIQUE: Multiplanar, multiecho pulse sequences of the brain and surrounding structures were obtained without intravenous contrast. Angiographic images of the head were obtained using MRA technique without contrast.  COMPARISON:  CT head without contrast 09/04/2014.  FINDINGS: MRI HEAD FINDINGS  The diffusion-weighted images confirm an acute nonhemorrhagic infarct involving the left lentiform nucleus extending superiorly into the caudate head. Additionally, there is cortical infarction within the posterior left temporal and parietal lobe. This involves or is just posterior to the expected location of Wernicke's area. No hemorrhage or mass lesion is present. The study is somewhat distorted by significant patient motion.  The examination had to be discontinued prior to completion  due to patient being unable to cooperate.  Flow is present in the major intracranial arteries. The globes and orbits are intact. The paranasal sinuses and mastoid air cells are clear.  MRA HEAD FINDINGS  Despite motion on other sequences, the patient was able level relatively still for the MRA sequence.  There is mild ectasia of the left internal carotid artery, particularly through the cavernous segment without a discrete aneurysm. The internal carotid arteries are otherwise within normal limits bilaterally. The right A1 segment is hypoplastic with a prominent posterior anterior communicating artery. Both A2 segments are well visualized  on the left. The M1 segments are within normal limits. The MCA bifurcations are within normal limits bilaterally. There is some attenuation of distal MCA branch vessels.  The left vertebral artery is slightly dominant to the right. There is some tortuosity of the vertebral arteries bilaterally without significant stenoses. The PICA origins are not visualized. The basilar artery is within normal limits. Both posterior cerebral arteries originate from the basilar tip. There is some attenuation of distal PCA branch vessels.  IMPRESSION: 1. Acute nonhemorrhagic infarct involving the anterior left lentiform nucleus and caudate head. 2. Acute nonhemorrhagic infarct involving the posterior left temporal and parietal lobe, potentially affecting Wernicke's area. 3. Distal small vessel disease is apparent on the MRA without significant proximal stenosis, aneurysm, or branch vessel occlusion. 4. The right A1 segment is hypoplastic or possibly stenosed. The left A1 segment is dominant.   Electronically Signed   By: Lawrence Santiago M.D.   On: 09/05/2014 10:58   Mr Brain Wo Contrast  09/05/2014   CLINICAL DATA:  A aphasia beginning at 1 o\'clock p.m. yesterday. Confusion. Right-sided facial droop. Initial encounter.  EXAM: MRI HEAD WITHOUT CONTRAST  MRA HEAD WITHOUT CONTRAST  TECHNIQUE:  Multiplanar, multiecho pulse sequences of the brain and surrounding structures were obtained without intravenous contrast. Angiographic images of the head were obtained using MRA technique without contrast.  COMPARISON:  CT head without contrast 09/04/2014.  FINDINGS: MRI HEAD FINDINGS  The diffusion-weighted images confirm an acute nonhemorrhagic infarct involving the left lentiform nucleus extending superiorly into the caudate head. Additionally, there is cortical infarction within the posterior left temporal and parietal lobe. This involves or is just posterior to the expected location of Wernicke's area. No hemorrhage or mass lesion is present. The study is somewhat distorted by significant patient motion.  The examination had to be discontinued prior to completion due to patient being unable to cooperate.  Flow is present in the major intracranial arteries. The globes and orbits are intact. The paranasal sinuses and mastoid air cells are clear.  MRA HEAD FINDINGS  Despite motion on other sequences, the patient was able level relatively still for the MRA sequence.  There is mild ectasia of the left internal carotid artery, particularly through the cavernous segment without a discrete aneurysm. The internal carotid arteries are otherwise within normal limits bilaterally. The right A1 segment is hypoplastic with a prominent posterior anterior communicating artery. Both A2 segments are well visualized on the left. The M1 segments are within normal limits. The MCA bifurcations are within normal limits bilaterally. There is some attenuation of distal MCA branch vessels.  The left vertebral artery is slightly dominant to the right. There is some tortuosity of the vertebral arteries bilaterally without significant stenoses. The PICA origins are not visualized. The basilar artery is within normal limits. Both posterior cerebral arteries originate from the basilar tip. There is some attenuation of distal PCA branch  vessels.  IMPRESSION: 1. Acute nonhemorrhagic infarct involving the anterior left lentiform nucleus and caudate head. 2. Acute nonhemorrhagic infarct involving the posterior left temporal and parietal lobe, potentially affecting Wernicke's area. 3. Distal small vessel disease is apparent on the MRA without significant proximal stenosis, aneurysm, or branch vessel occlusion. 4. The right A1 segment is hypoplastic or possibly stenosed. The left A1 segment is dominant.   Electronically Signed   By: Lawrence Santiago M.D.   On: 09/05/2014 10:58   Ct Abdomen Pelvis W Contrast  09/07/2014   CLINICAL DATA:  Unintentional weight loss.  Acute  left CVA.  EXAM: CT CHEST, ABDOMEN, AND PELVIS WITH CONTRAST  TECHNIQUE: Multidetector CT imaging of the chest, abdomen and pelvis was performed following the standard protocol during bolus administration of intravenous contrast.  CONTRAST:  80 cc Omnipaque 300  COMPARISON:  09/04/2014 chest radiograph  FINDINGS: CT CHEST FINDINGS  Right lower paratracheal lymph node upper normal sized at 9 mm. No additional adenopathy in the chest.  Minimal biapical pleural parenchymal scarring. The lungs appear otherwise clear.  CT ABDOMEN AND PELVIS FINDINGS  Hepatobiliary: Punctate 4 mm calcification in segment 2 of the liver, without surrounding abnormal enhancement. Central common bile duct appears dilated at 0.9 cm, with subtle intrahepatic biliary dilatation. Difficult to differentiate the CBD and proximal pancreatic duct as they approach the ampulla ; there may be a small stenotic segment of the distal CBD or the distal CBD may be completely tapered with a dilated segment of the dorsal pancreatic duct in the ampulla.  Pancreas: Possible dilated segment of the dorsal pancreatic duct near the ampulla as noted above. No differential enhancing mass in the pancreatic head or elsewhere in the pancreas.  Spleen: Intact  Adrenals/Urinary Tract: Abnormal peripheral regions of hypoenhancement scattered  in the parenchyma of both kidneys, without enhancing capsular margins. No hydronephrosis. Distended urinary bladder, bladder a volume currently 1 L.  Stomach/Bowel: Unremarkable  Vascular/Lymphatic: Unremarkable  Reproductive: Unremarkable  Other: No supplemental non-categorized findings.  Musculoskeletal: Unremarkable  IMPRESSION: 1. Bilateral regions of abnormal low-density in both kidneys, without cortical rim sign. Appearance favors bilateral pyelonephritis. There may be incipient abscess formation in the left mid to upper kidney on image 11 of series 7. Renal infarcts right less likely differential diagnostic consideration given the appearance. 2. Mildly dilated CBD with an unusual appearance distally where there is narrowing of a segment of the CBD and either subsequent distal dilatation or a small dilated segment of the dorsal pancreatic duct. No differential enhancement in the pancreatic tissue is observed to suggest a neoplasm, although today's exam was protocol does a standard CT abdomen over rather than a pancreatic protocol exam. 3. Distended urinary bladder, significance uncertain, possibly simply incidental. 4. No worrisome findings in the chest.   Electronically Signed   By: Sherryl Barters M.D.   On: 09/07/2014 07:48   Nm Myocar Multi W/spect W/wall Motion / Ef  09/08/2014   CLINICAL DATA:  Chest pain and history of cardiomyopathy, hypertension, diabetes and cerebral infarction.  EXAM: MYOCARDIAL IMAGING WITH SPECT (REST AND PHARMACOLOGIC-STRESS)  GATED LEFT VENTRICULAR WALL MOTION STUDY  LEFT VENTRICULAR EJECTION FRACTION  TECHNIQUE: Standard myocardial SPECT imaging was performed after resting intravenous injection of 10 mCi [id number]. Subsequently, intravenous infusion of Lexiscan was performed under the supervision of the Cardiology staff. At peak effect of the drug, 30 mCi [id number] was injected intravenously and standard myocardial SPECT imaging was performed. Quantitative  gated imaging was also performed to evaluate left ventricular wall motion, and estimate left ventricular ejection fraction.  COMPARISON:  None  FINDINGS: Perfusion: There is evidence of scar involving the mid to distal anteroseptal wall and the mid to basilar inferior wall. There is potentially a small region of distal anterior wall ischemia.  Wall Motion: Global hypokinesis and nearly akinetic septum.  Left Ventricular Ejection Fraction: 37 %  End diastolic volume 10741l  End systolic volume 64 ml  IMPRESSION: 1. Anteroseptal and inferior wall scar. Potential inducible ischemia in a small region of the distal anterior wall.  2. Global hypokinesis and near akinesis  of the septum.  3. Left ventricular ejection fraction 37%  4. Intermediate-risk stress test findings*.  *2012 Appropriate Use Criteria for Coronary Revascularization Focused Update: J Am Coll Cardiol. 5697;94(8):016-553. http://content.airportbarriers.com.aspx?articleid=1201161   Electronically Signed   By: Aletta Edouard M.D.   On: 09/08/2014 16:13   Dg Chest Portable 1 View  09/04/2014   CLINICAL DATA:  Altered mental status, 1 day history of RIGHT facial droop, decreased communication.  EXAM: PORTABLE CHEST - 1 VIEW  COMPARISON:  Chest radiograph Apr 01, 2014  FINDINGS: Cardiomediastinal silhouette is unremarkable. The lungs are clear without pleural effusions or focal consolidations. Trachea projects midline and there is no pneumothorax. Soft tissue planes and included osseous structures are non-suspicious.  IMPRESSION: No acute cardiopulmonary process.   Electronically Signed   By: Elon Alas   On: 09/04/2014 23:06    Microbiology: Recent Results (from the past 240 hour(s))  URINE CULTURE     Status: None   Collection Time    09/07/14  2:05 PM      Result Value Ref Range Status   Specimen Description URINE, CATHETERIZED   Final   Special Requests NONE   Final   Culture  Setup Time     Final   Value: 09/07/2014 20:56      Performed at [facility]     Final   Value: NO GROWTH     Performed at [facility]   Culture     Final   Value: NO GROWTH     Performed at [facility]   Report Status 09/08/2014 FINAL   Final     Labs: Basic Metabolic Panel:  Recent Labs Lab 09/04/14 2240 09/05/14 0705 09/08/14 0548 09/10/14 0940  NA 139 137 138 135*  K 4.0 4.0 3.9 4.2  CL 98 102 103 99  CO2 25 22 21 24   GLUCOSE 126* 105* 139* 149*  BUN 12 11 9 11   CREATININE 0.60 0.54 0.53 0.51  CALCIUM 10.0 9.2 9.4 9.6   Liver Function Tests:  Recent Labs Lab 09/04/14 2240 09/05/14 0705  AST 15 14  ALT 8 7  ALKPHOS 49 44  BILITOT 0.8 0.8  PROT 8.0 7.0  ALBUMIN 3.5 3.1*   No results found for this basename: LIPASE, AMYLASE,  in the last 168 hours  Recent Labs Lab 09/04/14 2240  AMMONIA 20   CBC:  Recent Labs Lab 09/04/14 2240 09/05/14 0705 09/07/14 1215 09/08/14 0548  WBC 7.6 6.1 5.9 5.7  NEUTROABS 4.3 3.4  --   --   HGB 13.0 12.1 12.0 11.3*  HCT 39.5 35.3* 35.5* 33.7*  MCV 66.7* 64.8* 64.9* 65.4*  PLT 350 295 312 281   Cardiac Enzymes: No results found for this basename: CKTOTAL, CKMB, CKMBINDEX, TROPONINI,  in the last 168 hours BNP: BNP (last 3 results) No results found for this basename: PROBNP,  in the last 8760 hours CBG:  Recent Labs Lab 09/09/14 0621 09/09/14 1115 09/09/14 1611 09/09/14 2129 09/10/14 0638  GLUCAP 125* 128* 128* 126* 154*       Signed:  Gift Rueckert A  [facility] 09/10/2014, 11:27 AM

## 2014-09-10 NOTE — Progress Notes (Signed)
Bladder scan was zero ml residual at present time; patient has ambulated to the bathroom to void twice since 0700; abd. Soft,nondistended, no pain.

## 2014-09-10 NOTE — Progress Notes (Signed)
    SUBJECTIVE:  She denies any pain or SOB.  She is answering questions   PHYSICAL EXAM Filed Vitals:   09/09/14 1739 09/09/14 2128 09/10/14 0148 09/10/14 0540  BP: 111/59 125/72 111/47 118/72  Pulse: 112 102 107 102  Temp: 97.9 F (36.6 C) 98.5 F (36.9 C) 98.7 F (37.1 C) 98.9 F (37.2 C)  TempSrc: Axillary Oral Oral Oral  Resp: 18 16 16 16   Height:      Weight:      SpO2: 100% 99% 98% 100%   General:  No distress Lungs:  Clear Heart:  RRR Abdomen:  Positive bowel sounds, no rebound no guarding Extremities:  No edema   LABS: No results found for this basename: TROPONINI   Results for orders placed during the hospital encounter of 09/04/14 (from the past 24 hour(s))  GLUCOSE, CAPILLARY     Status: Abnormal   Collection Time    09/09/14 11:15 AM      Result Value Ref Range   Glucose-Capillary 128 (*) 70 - 99 mg/dL   Comment 1 Notify RN    GLUCOSE, CAPILLARY     Status: Abnormal   Collection Time    09/09/14  4:11 PM      Result Value Ref Range   Glucose-Capillary 128 (*) 70 - 99 mg/dL   Comment 1 Notify RN    GLUCOSE, CAPILLARY     Status: Abnormal   Collection Time    09/09/14  9:29 PM      Result Value Ref Range   Glucose-Capillary 126 (*) 70 - 99 mg/dL   Comment 1 Notify RN     Comment 2 Documented in Chart    GLUCOSE, CAPILLARY     Status: Abnormal   Collection Time    09/10/14  6:38 AM      Result Value Ref Range   Glucose-Capillary 154 (*) 70 - 99 mg/dL    Intake/Output Summary (Last 24 hours) at 09/10/14 0924 Last data filed at 09/10/14 0000  Gross per 24 hour  Intake      0 ml  Output    400 ml  Net   -400 ml    ASSESSMENT AND PLAN:  Cardiomyopathy:  Abnormal stress test with scar and possible ischemia.  However, the plan now is for no invasive evaluation.  Continue medical therapy.  EF 40%.  Beta blocker increased yesterday.  No change in meds today.   HTN:  This is being managed in the context of treating his CHF  CVA:  Dr. Marlou Porch has  suggested warfarin and discussed it with the family.   They would like to continue ASA.  I would suggest talking to EP about a Linq prior to discharge to follow for atrial fib.  They would certainly understand starting NOAC or warfarin if there is proven fib.   Jeneen Rinks [facility] 09/10/2014 9:24 AM

## 2014-09-10 NOTE — Progress Notes (Signed)
Patient ready for discharge home; discharge instructions given and reviewed with patient and her husband;Rx's given; rollator walker delivered to the room for home use; patient impulsive to leave; she did sign her discharge paper's appropriately; all belongings returned from room; husband accompanied patient for discharge.  Patient will be receiving home health care after discharge.

## 2014-09-13 LAB — FACTOR 5 LEIDEN

## 2014-09-13 LAB — PROTHROMBIN GENE MUTATION

## 2014-09-14 ENCOUNTER — Telehealth: Payer: Self-pay | Admitting: Internal Medicine

## 2014-09-14 NOTE — Telephone Encounter (Signed)
Patient to see provider 10/22 at 12:10pm

## 2014-09-14 NOTE — Telephone Encounter (Signed)
Attempted to call & number provided that called in - also patient's listed # - was not available at this time

## 2014-09-14 NOTE — Telephone Encounter (Signed)
New message           Pt was seen in the hospital and Dr Debara Pickett did a consult on a pt and pt would like to know if she can get some instructions on how to take medication when pt cannot keep food down / pt has appt tomorrow at 12 with PA here / Please give pt a call

## 2014-09-15 ENCOUNTER — Ambulatory Visit (INDEPENDENT_AMBULATORY_CARE_PROVIDER_SITE_OTHER): Payer: Medicare Other | Admitting: Physician Assistant

## 2014-09-15 ENCOUNTER — Encounter: Payer: Self-pay | Admitting: Physician Assistant

## 2014-09-15 VITALS — BP 90/58 | HR 107 | Ht 66.0 in | Wt 92.0 lb

## 2014-09-15 DIAGNOSIS — I429 Cardiomyopathy, unspecified: Secondary | ICD-10-CM

## 2014-09-15 DIAGNOSIS — I10 Essential (primary) hypertension: Secondary | ICD-10-CM | POA: Diagnosis not present

## 2014-09-15 DIAGNOSIS — E118 Type 2 diabetes mellitus with unspecified complications: Secondary | ICD-10-CM

## 2014-09-15 DIAGNOSIS — I639 Cerebral infarction, unspecified: Secondary | ICD-10-CM | POA: Diagnosis not present

## 2014-09-15 NOTE — Patient Instructions (Signed)
YOU WILL NEED TO FOLLOW UP WITH PRIMARY CARE ABOUT YOUR DIABETES  Your physician recommends that you schedule a follow-up appointment in: [place] DR. HILTY

## 2014-09-15 NOTE — Progress Notes (Signed)
Cardiology Office Note   Date:  09/15/2014   ID:  Kristen Lozano, DOB 1956/08/10, MRN [id number]  PCP:  Vonna Drafts., FNP  Cardiologist:  Dr. K. Mali Hilty      History of Present Illness: Kristen Lozano is a 58 y.o. female with a hx of HTN, DM2, sickle cell trait, OA.  She was admitted recently with a L brain CVA and Echo/TEE demonstrated EF ~ 35%.  Work up included a nuclear stress test which demonstrated ant-septal and inf scar with small area of distal anterior ischemia.  However, cath was felt to be contraindicated in the setting of a recent CVA.  Coumadin was considered given reduced EF and likely thromboembolic CVA.  However, the family was hesitant to start this medication.  Prior to DC Dr. Percival Spanish mentioned an EP eval for possible LINQ device.  It does not appear that this was done.  Unfortunately, the DC summary has not been completed.  She returns for follow up.  She is here with her sister. She still continues to struggle with some expressive aphasia. She denies chest pain, dyspnea, syncope, dizziness, orthopnea, PND or edema. She appears to have a difficult situation at home. Her sister tells me that her husband does not allow her to take some medications at times. Apparently, the [facility] is coming out to discuss medications further with the patient and her husband. Her sugars have been increasing. She was apparently taken off of insulin and placed on metformin at discharge.   Studies:  - Echo (10/15): EF 35% to 40%. Severe global HK,  Gr 1 diastolic dysfunction, mild MR, mild TR, PA peak pressure: 28 mm Hg (S).  - TEE (10/15):  No LAA thrombus.  No PFO  LVEF 30-35%  - Nuclear (10/15):  Anteroseptal and inferior wall scar. Potential inducible ischemia in a small region of the distal anterior wall. Global hypokinesis and near akinesis of the septum. EF 37%; Intermediate-risk   - Carotid US (10/15):  1-39% internal carotid artery stenosis bilaterally    Recent  Labs/Images:  Recent Labs  09/04/14 2240  09/05/14 0705 09/05/14 2308  09/08/14 0548 09/10/14 0940  NA 139  --  137  --   --  138 135*  K 4.0  --  4.0  --   --  3.9 4.2  BUN 12  --  11  --   --  9 11  CREATININE 0.60  --  0.54  --   --  0.53 0.51  ALT 8  --  7  --   --   --   --   HGB 13.0  --  12.1  --   < > 11.3*  --   TSH 3.520  --   --   --   --   --   --   LDLCALC  --   < >  --  77  --   --   --   HDL  --   < >  --  49  --   --   --   < > = values in this interval not displayed.    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   Electronically Signed   By: Kristen Lozano M.D.   On: 09/05/2014 00:03   Mr Kristen Lozano Head Wo Contrast  09/05/2014    IMPRESSION: 1. Acute nonhemorrhagic infarct involving the anterior left  lentiform nucleus and caudate head. 2. Acute nonhemorrhagic infarct involving the posterior left temporal and parietal lobe, potentially affecting Wernicke's area. 3. Distal small vessel disease is apparent on the MRA without significant proximal stenosis, aneurysm, or branch vessel occlusion. 4. The right A1 segment is hypoplastic or possibly stenosed. The left A1 segment is dominant.   Electronically Signed   By: Lawrence Santiago M.D.   On: 09/05/2014 10:58    Wt Readings from Last 3 Encounters:  09/15/14 92 lb (41.731 kg)  09/05/14 101 lb (45.813 kg)  09/05/14 101 lb (45.813 kg)     Past Medical History  Diagnosis Date  . Anxiety     Has been on Xanax since 2009. Uses it for stress, anxiety, and insomnia. No contract yet.  . Depression   . Sickle cell trait   . Diabetes mellitus     Type 2, non insulin dependent. Was dx'd prior to 2008.  Marland Kitchen Hypertension   . Chronic pain     Has OA of knees B. No Xrays in echart. Not requiring narcotics.  . Allergic rhinitis     Requires cetirizine, singulair, and fluticasone.  . Arthritis     Current Outpatient Prescriptions  Medication Sig  Dispense Refill  . aspirin 325 MG tablet Take 1 tablet (325 mg total) by mouth daily.  30 tablet  0  . atorvastatin (LIPITOR) 20 MG tablet Take 20 mg by mouth daily at 6 PM.      . carvedilol (COREG) 12.5 MG tablet Take 1 tablet (12.5 mg total) by mouth 2 (two) times daily with a meal.  60 tablet  0  . feeding supplement, GLUCERNA SHAKE, (GLUCERNA SHAKE) LIQD Take 237 mLs by mouth 3 (three) times daily with meals.  30 Can  2  . lisinopril (PRINIVIL,ZESTRIL) 10 MG tablet Take 10 mg by mouth daily.      . metFORMIN (GLUCOPHAGE) 1000 MG tablet Take 1,000 mg by mouth 2 (two) times daily as needed (when insulin is not available).       . metoCLOPramide (REGLAN) 10 MG tablet Take 1 tablet (10 mg total) by mouth 4 (four) times daily -  before meals and at bedtime.  30 tablet  0  . ondansetron (ZOFRAN ODT) 8 MG disintegrating tablet Take 1 tablet (8 mg total) by mouth every 8 (eight) hours as needed for nausea or vomiting.  20 tablet  0  . oxyCODONE-acetaminophen (PERCOCET) 10-325 MG per tablet Take 1 tablet by mouth 3 (three) times daily as needed. For pain      . polyethylene glycol (MIRALAX / GLYCOLAX) packet Take 17 g by mouth daily.  14 each  0   No current facility-administered medications for this visit.     Allergies:   Review of patient's allergies indicates no known allergies.   Social History:  The patient  reports that she has never smoked. She does not have any smokeless tobacco history on file. She reports that she does not drink alcohol or use illicit drugs.   Family History:  The patient's family history includes Cancer in her sister and sister; Diabetes in her mother; Heart attack in her father; Heart disease in her father; Hypertension in her brother, daughter, father, mother, and sister; Schizophrenia in her daughter; Sickle cell anemia in her daughter. There is no history of Stroke.   ROS:  Please see the history of present illness.       All other systems reviewed and negative.     PHYSICAL  EXAM: VS:  BP 90/58  Pulse 107  Ht 5\' 6"  (1.676 m)  Wt 92 lb (41.731 kg)  BMI 14.86 kg/m2 Well nourished, well developed, in no acute distress HEENT: normal Neck:  no JVD Cardiac:  normal S1, S2;  RRR; no murmur Lungs:   clear to auscultation bilaterally, no wheezing, rhonchi or rales Abd: soft, nontender, no hepatomegaly Ext:  no edema Skin: warm and dry Neuro:  CNs 2-12 intact, no focal abnormalities noted  EKG:  Sinus tachycardia, HR 107, normal axis, no ST changes      ASSESSMENT AND PLAN:  Secondary cardiomyopathy -  She likely has an ischemic cardiomyopathy given scar noted on nuclear stress test. Cardiac catheterization was not recommended given the close proximity to her recent CVA. Medical therapy was initiated in the hospital. She remains on a beta blocker and ACE inhibitor. Apparently, Coumadin was recommended by neurology. The patient's family apparently was hesitant to proceed with this. The patient's sister tells me that the patient's husband is somewhat hesitant to allow his wife to take different medications. I will try to be in touch with the home health nurse. Her blood pressure is too low to tolerate an increase in her beta blocker or ACE inhibitor. Heart rate remains somewhat elevated. We could consider adding digoxin.  However, I am hesitant to add any more medications given her current situation.  She also has issues with gastroparesis.  She sees GI soon.  Recent Albumin was 3.1. She is malnourished.    Essential hypertension, benign -  BP running low.  Continue current Rx.  Diabetes Mellitus -  Recent A1c 6.2.  Good control. However, at DC her insulin was stopped.  Sugars at home have been higher.  I will try to arrange FU with her PCP.  Stroke -  I reviewed her case today with Dr. Lovena Le (EP). At this point, it does not seem reasonable to consider implanting a loop recorder unless the patient and her family are agreeable to starting on anticoagulation  therapy. At this point, this does not seem to be the case. This can certainly be considered in the future. For now she will continue aspirin, statin and follow up with neurology and cardiology.  Disposition:   I spent > 30 minutes today reviewing the chart and counseling the patient and her sister.  FU with Dr. Debara Pickett 4 weeks.   Signed, Versie Starks, MHS 09/15/2014 1:16 PM    [facility] [place], [place], [place]  [address] Phone: [phone]; Fax: [phone]

## 2014-09-16 ENCOUNTER — Telehealth: Payer: Self-pay | Admitting: Physician Assistant

## 2014-09-16 NOTE — Telephone Encounter (Signed)
New message     Pt want to start coumadin.  Pt saw Scott yesterday and decided to start coumadin.  Please call it in to [facility] pharmacy.  Call back and let her know it has been called in please.

## 2014-09-16 NOTE — Telephone Encounter (Signed)
Attempted to call. voicemailbox is full

## 2014-09-16 NOTE — Telephone Encounter (Signed)
Neuro in the hospital recommended coumadin and Cardiology agreed. Family was hesitant to start and she was continued on ASA only. I saw her with her sister.  Patient is still somewhat aphasic.  She was not ready to begin coumadin. I would rather she come in for a coumadin clinic visit for education prior to starting.  She can be started on Coumadin that day if the family and patient still agree to take it. Arrange coumadin clinic consult visit.

## 2014-09-16 NOTE — Telephone Encounter (Signed)
Attempted to call again/mailbox is full. Can't leave message.

## 2014-09-16 NOTE — Telephone Encounter (Signed)
Sent to [employer] PA

## 2014-09-19 ENCOUNTER — Telehealth: Payer: Self-pay | Admitting: Gastroenterology

## 2014-09-19 NOTE — Telephone Encounter (Signed)
Spoke with home health RN, Ebony Hail. She was called to the home by the family. The patient has stopped swallowing solids. She has been surviving on liquid diet. There is a difficult situation in the home. Appointment made with PA. Appointment with Deatra Ina not cancelled. Patient's records are in [place].

## 2014-09-19 NOTE — Telephone Encounter (Signed)
Got in touch with a female relative at the home, saying he was the brother-in-law of patient. Asked him to get in touch with patient's sister and ask her to call us.

## 2014-09-20 ENCOUNTER — Telehealth: Payer: Self-pay | Admitting: Gastroenterology

## 2014-09-20 NOTE — Telephone Encounter (Signed)
I have left message for the Bryson Ha (speech pathology) to call back

## 2014-09-21 ENCOUNTER — Ambulatory Visit (INDEPENDENT_AMBULATORY_CARE_PROVIDER_SITE_OTHER): Payer: Medicare Other | Admitting: Gastroenterology

## 2014-09-21 ENCOUNTER — Encounter: Payer: Self-pay | Admitting: Gastroenterology

## 2014-09-21 ENCOUNTER — Other Ambulatory Visit (INDEPENDENT_AMBULATORY_CARE_PROVIDER_SITE_OTHER): Payer: Medicare Other

## 2014-09-21 VITALS — BP 94/62 | HR 64 | Ht 64.25 in | Wt 90.1 lb

## 2014-09-21 DIAGNOSIS — K838 Other specified diseases of biliary tract: Secondary | ICD-10-CM

## 2014-09-21 DIAGNOSIS — R9389 Abnormal findings on diagnostic imaging of other specified body structures: Secondary | ICD-10-CM

## 2014-09-21 DIAGNOSIS — R938 Abnormal findings on diagnostic imaging of other specified body structures: Secondary | ICD-10-CM

## 2014-09-21 DIAGNOSIS — R1314 Dysphagia, pharyngoesophageal phase: Secondary | ICD-10-CM

## 2014-09-21 LAB — HEPATIC FUNCTION PANEL
ALK PHOS: 43 U/L (ref 39–117)
ALT: 26 U/L (ref 0–35)
AST: 28 U/L (ref 0–37)
Albumin: 3.5 g/dL (ref 3.5–5.2)
Bilirubin, Direct: 0.1 mg/dL (ref 0.0–0.3)
Total Bilirubin: 1 mg/dL (ref 0.2–1.2)
Total Protein: 7.8 g/dL (ref 6.0–8.3)

## 2014-09-21 NOTE — Telephone Encounter (Signed)
Again, attempted to contact this patient. [phone] is disconnected.

## 2014-09-21 NOTE — Telephone Encounter (Signed)
Ok. Richardson Dopp, PA-C   09/21/2014 1:23 PM

## 2014-09-21 NOTE — Telephone Encounter (Signed)
[phone] is also disconnected.

## 2014-09-21 NOTE — Patient Instructions (Signed)
You have been scheduled for a MRI/MRCP at [facility] (1st floor of the hospital) on 09-29-2014 at 10 am. Please arrive 15 minutes prior to your appointment for registration. Make certain not to have anything to eat or drink 4 hours prior to your test. If you need to reschedule for any reason, please contact radiology at [phone] to do so. ____________________________________________________________________________________________________________________________________________________________________________________________  You have been scheduled for a Barium Esophogram at [facility] (1st floor of the hospital) on 09-26-2014 at 9:30 am. Please arrive 15 minutes prior to your appointment for registration. Make certain not to have anything to eat or drink 6 hours prior to your test. If you need to reschedule for any reason, please contact radiology at [phone] to do so. __________________________________________________________________ A barium swallow is an examination that concentrates on views of the esophagus. This tends to be a double contrast exam (barium and two liquids which, when combined, create a gas to distend the wall of the oesophagus) or single contrast (non-ionic iodine based). The study is usually tailored to your symptoms so a good history is essential. Attention is paid during the study to the form, structure and configuration of the esophagus, looking for functional disorders (such as aspiration, dysphagia, achalasia, motility and reflux) EXAMINATION You may be asked to change into a gown, depending on the type of swallow being performed. A radiologist and radiographer will perform the procedure. The radiologist will advise you of the type of contrast selected for your procedure and direct you during the exam. You will be asked to stand, sit or lie in several different positions and to hold a small amount of fluid in your mouth before being asked to swallow  while the imaging is performed .In some instances you may be asked to swallow barium coated marshmallows to assess the motility of a solid food bolus. The exam can be recorded as a digital or video fluoroscopy procedure. POST PROCEDURE It will take 1-2 days for the barium to pass through your system. To facilitate this, it is important, unless otherwise directed, to increase your fluids for the next 24-48hrs and to resume your normal diet.  This test typically takes about 30 minutes to perform. _________________________________________________________________________________________________________________________________________________________________________________________

## 2014-09-21 NOTE — Telephone Encounter (Signed)
Per voicemail, Kristen Lozano (Electrical engineer) reports with much effort and verbal que, the patient swallowed a cheeto and some applesauce. It is still taking a lot of concentration.

## 2014-09-23 ENCOUNTER — Encounter: Payer: Self-pay | Admitting: Gastroenterology

## 2014-09-23 DIAGNOSIS — K838 Other specified diseases of biliary tract: Secondary | ICD-10-CM | POA: Insufficient documentation

## 2014-09-23 DIAGNOSIS — R9389 Abnormal findings on diagnostic imaging of other specified body structures: Secondary | ICD-10-CM | POA: Insufficient documentation

## 2014-09-23 DIAGNOSIS — R1314 Dysphagia, pharyngoesophageal phase: Secondary | ICD-10-CM | POA: Insufficient documentation

## 2014-09-23 NOTE — Progress Notes (Signed)
09/23/2014 Kristen Lozano [id number] 1956-04-02   HISTORY OF PRESENT ILLNESS:  This is a 58 year old female who is new to our practice.  She was apparently referred here at the request of the speech pathologist for dysphagia.  Patient had a CVA on 10/12, which left her aphasic.  She has been working with speech pathology for that issue.  She apparently has also been having issues with swallowing.  Because patient is not speaking her sister who accompanied her to her visit today provided the history.  Her sister reports that she has not been eating.  She is holding food and drinks in her mouth and sometimes will not swallow them at all.  She says that even before the stroke that her appetite was poor and she seemed to have issues with swallowing.  She has lost weight and is only 90 pounds currently.  Her sister says that the speech pathologist wanted her seen here because they think that she has an esophageal issue causing her "dysphagia".  There are no formal reports regarding swallowing evaluation with speech pathology in the computer.  Also, on CT scan that was performed in the hospital there were findings as follows:  "Mildly dilated CBD with an unusual appearance distally where there is narrowing of a segment of the CBD and either subsequent distal dilatation or a small dilated segment of the dorsal pancreatic duct. No differential enhancement in the pancreatic tissue is observed to suggest a neoplasm, although today's exam was  protocol does a standard CT abdomen over rather than a pancreatic protocol exam."  Her sister reports that she does complain of epigastric pain on occasion.  LFT's have been normal.    Past Medical History  Diagnosis Date  . Anxiety     Has been on Xanax since 2009. Uses it for stress, anxiety, and insomnia. No contract yet.  . Depression   . Sickle cell trait   . Diabetes mellitus     Type 2, non insulin dependent. Was dx'd prior to 2008.  Marland Kitchen Hypertension     . Chronic pain     Has OA of knees B. No Xrays in echart. Not requiring narcotics.  . Allergic rhinitis     Requires cetirizine, singulair, and fluticasone.  . Arthritis   . Hyperlipemia   . Stroke 09/05/14   Past Surgical History  Procedure Laterality Date  . Tee without cardioversion N/A 09/06/2014    Procedure: TRANSESOPHAGEAL ECHOCARDIOGRAM (TEE);  Surgeon: Pixie Casino, MD;  Location: [facility] ENDOSCOPY;  Service: Cardiovascular;  Laterality: N/A;    reports that she has never smoked. She does not have any smokeless tobacco history on file. She reports that she does not drink alcohol or use illicit drugs. family history includes Diabetes in her father and mother; Heart attack in her father; Heart disease in her father; Hypertension in her brother, daughter, father, mother, and sister; Kidney disease in her brother and sister; Liver cancer in her sister; Ovarian cancer in her sister; Schizophrenia in her daughter; Sickle cell anemia in her daughter. There is no history of Stroke, Esophageal cancer, Colon cancer, or Colon polyps. No Known Allergies    Outpatient Encounter Prescriptions as of 09/21/2014  Medication Sig  . ALPRAZolam (XANAX XR) 2 MG 24 hr tablet Take 2 mg by mouth daily.  Marland Kitchen aspirin 325 MG tablet Take 1 tablet (325 mg total) by mouth daily.  Marland Kitchen atorvastatin (LIPITOR) 20 MG tablet Take 20 mg by mouth daily  at 6 PM.  . carvedilol (COREG) 12.5 MG tablet Take 1 tablet (12.5 mg total) by mouth 2 (two) times daily with a meal.  . feeding supplement, GLUCERNA SHAKE, (GLUCERNA SHAKE) LIQD Take 237 mLs by mouth 3 (three) times daily with meals.  Marland Kitchen lisinopril (PRINIVIL,ZESTRIL) 10 MG tablet Take 10 mg by mouth daily.  . metFORMIN (GLUCOPHAGE) 1000 MG tablet Take 1,000 mg by mouth 2 (two) times daily as needed (when insulin is not available).   . metoCLOPramide (REGLAN) 10 MG tablet Take 1 tablet (10 mg total) by mouth 4 (four) times daily -  before meals and at bedtime.  .  ondansetron (ZOFRAN ODT) 8 MG disintegrating tablet Take 1 tablet (8 mg total) by mouth every 8 (eight) hours as needed for nausea or vomiting.  Marland Kitchen oxyCODONE-acetaminophen (PERCOCET) 10-325 MG per tablet Take 1 tablet by mouth 3 (three) times daily as needed. For pain  . polyethylene glycol (MIRALAX / GLYCOLAX) packet Take 17 g by mouth daily.     REVIEW OF SYSTEMS  : All other systems reviewed and negative except where noted in the History of Present Illness.   PHYSICAL EXAM: BP 94/62  Pulse 64  Ht 5' 4.25" (1.632 m)  Wt 90 lb 2 oz (40.88 kg)  BMI 15.35 kg/m2 General: Very thin black female in no acute distress Head: Normocephalic and atraumatic Eyes:  Sclerae anicteric,conjunctive pink. Ears: Normal auditory acuity Lungs: Clear throughout to auscultation Heart: Regular rate and rhythm Abdomen: Soft, non-distended.  Normal bowel sounds. Non-tender. Musculoskeletal: Symmetrical with no gross deformities  Skin: No lesions on visible extremities Extremities: No edema  Neurological:  Patient is aphasic since her stroke   ASSESSMENT AND PLAN: -"Dysphagia":  Difficult to decipher exactly what is happening.  It sounds like some of what is occurring is related to her stroke.  We will start with an esophagram to rule out structural issues of the esophagus.  She is not a good candidate for endoscopy at this time due to her recent stroke.  If esophagram is normal then we will ask speech pathology to do a thorough evaluation with MBSS, etc.  ? If she will need PEG tube if she continues with poor PO intake; she cannot afford to lose more weight. -Abnormal CT scan showing dilated CBD:  Will recheck LFT's today and schedule for MRI abdomen/MRCP.

## 2014-09-25 NOTE — Progress Notes (Signed)
Reviewed and agree with management plan.  Roemello Speyer T. Avel Ogawa, MD FACG 

## 2014-09-26 ENCOUNTER — Ambulatory Visit (HOSPITAL_COMMUNITY)
Admission: RE | Admit: 2014-09-26 | Discharge: 2014-09-26 | Disposition: A | Discharge status: Home / Self Care | Payer: Medicare Other | Source: Ambulatory Visit | Attending: Gastroenterology | Admitting: Gastroenterology

## 2014-09-26 ENCOUNTER — Other Ambulatory Visit: Payer: Self-pay | Admitting: *Deleted

## 2014-09-26 DIAGNOSIS — R1314 Dysphagia, pharyngoesophageal phase: Secondary | ICD-10-CM

## 2014-09-26 DIAGNOSIS — R1310 Dysphagia, unspecified: Secondary | ICD-10-CM

## 2014-09-27 ENCOUNTER — Other Ambulatory Visit (HOSPITAL_COMMUNITY): Payer: Self-pay | Admitting: Gastroenterology

## 2014-09-27 DIAGNOSIS — R1314 Dysphagia, pharyngoesophageal phase: Secondary | ICD-10-CM

## 2014-09-29 ENCOUNTER — Other Ambulatory Visit: Payer: Self-pay | Admitting: Gastroenterology

## 2014-09-29 ENCOUNTER — Ambulatory Visit (HOSPITAL_COMMUNITY)
Admission: RE | Admit: 2014-09-29 | Discharge: 2014-09-29 | Disposition: A | Discharge status: Home / Self Care | Payer: Medicare Other | Source: Ambulatory Visit | Attending: Gastroenterology | Admitting: Gastroenterology

## 2014-09-29 DIAGNOSIS — R9389 Abnormal findings on diagnostic imaging of other specified body structures: Secondary | ICD-10-CM

## 2014-09-29 DIAGNOSIS — K838 Other specified diseases of biliary tract: Secondary | ICD-10-CM | POA: Insufficient documentation

## 2014-09-29 MED ORDER — GADOBENATE DIMEGLUMINE 529 MG/ML IV SOLN
8.0000 mL | Freq: Once | INTRAVENOUS | Status: AC | PRN
  Administered 2014-09-29: 8 mL via INTRAVENOUS

## 2014-10-03 ENCOUNTER — Other Ambulatory Visit: Payer: Self-pay | Admitting: *Deleted

## 2014-10-03 ENCOUNTER — Telehealth: Payer: Self-pay | Admitting: Gastroenterology

## 2014-10-03 DIAGNOSIS — R945 Abnormal results of liver function studies: Secondary | ICD-10-CM

## 2014-10-03 DIAGNOSIS — R7989 Other specified abnormal findings of blood chemistry: Secondary | ICD-10-CM

## 2014-10-03 NOTE — Telephone Encounter (Signed)
Patient is calling for MRCP results. Please, advise.

## 2014-10-06 ENCOUNTER — Ambulatory Visit: Payer: Medicare Other | Admitting: Gastroenterology

## 2014-10-07 ENCOUNTER — Ambulatory Visit (HOSPITAL_COMMUNITY)
Admission: RE | Admit: 2014-10-07 | Discharge: 2014-10-07 | Disposition: A | Discharge status: Home / Self Care | Payer: Medicare Other | Source: Ambulatory Visit | Attending: Gastroenterology | Admitting: Gastroenterology

## 2014-10-07 ENCOUNTER — Other Ambulatory Visit (HOSPITAL_COMMUNITY): Payer: Self-pay | Admitting: Gastroenterology

## 2014-10-07 DIAGNOSIS — M1990 Unspecified osteoarthritis, unspecified site: Secondary | ICD-10-CM | POA: Insufficient documentation

## 2014-10-07 DIAGNOSIS — R1314 Dysphagia, pharyngoesophageal phase: Secondary | ICD-10-CM

## 2014-10-07 DIAGNOSIS — D573 Sickle-cell trait: Secondary | ICD-10-CM | POA: Diagnosis not present

## 2014-10-07 DIAGNOSIS — E119 Type 2 diabetes mellitus without complications: Secondary | ICD-10-CM | POA: Insufficient documentation

## 2014-10-07 DIAGNOSIS — G8929 Other chronic pain: Secondary | ICD-10-CM | POA: Insufficient documentation

## 2014-10-07 DIAGNOSIS — R1310 Dysphagia, unspecified: Secondary | ICD-10-CM

## 2014-10-07 DIAGNOSIS — I10 Essential (primary) hypertension: Secondary | ICD-10-CM | POA: Insufficient documentation

## 2014-10-07 DIAGNOSIS — R1311 Dysphagia, oral phase: Secondary | ICD-10-CM | POA: Diagnosis not present

## 2014-10-07 DIAGNOSIS — F329 Major depressive disorder, single episode, unspecified: Secondary | ICD-10-CM | POA: Insufficient documentation

## 2014-10-07 DIAGNOSIS — Z8673 Personal history of transient ischemic attack (TIA), and cerebral infarction without residual deficits: Secondary | ICD-10-CM | POA: Insufficient documentation

## 2014-10-07 DIAGNOSIS — E785 Hyperlipidemia, unspecified: Secondary | ICD-10-CM | POA: Insufficient documentation

## 2014-10-07 DIAGNOSIS — F419 Anxiety disorder, unspecified: Secondary | ICD-10-CM | POA: Insufficient documentation

## 2014-10-07 NOTE — Procedures (Addendum)
Objective Swallowing Evaluation: Modified Barium Swallowing Study  Patient Details  Name: Kristen Lozano MRN: [id number] Date of Birth: 03-28-1956  Today's Date: 10/07/2014 Time: 1250-1332 SLP Time Calculation (min) (ACUTE ONLY): 42 min  Past Medical History:  Past Medical History  Diagnosis Date  . Anxiety     Has been on Xanax since 2009. Uses it for stress, anxiety, and insomnia. No contract yet.  . Depression   . Sickle cell trait   . Diabetes mellitus     Type 2, non insulin dependent. Was dx'd prior to 2008.  Marland Kitchen Hypertension   . Chronic pain     Has OA of knees B. No Xrays in echart. Not requiring narcotics.  . Allergic rhinitis     Requires cetirizine, singulair, and fluticasone.  . Arthritis   . Hyperlipemia   . Stroke 09/05/14   Past Surgical History:  Past Surgical History  Procedure Laterality Date  . Tee without cardioversion N/A 09/06/2014    Procedure: TRANSESOPHAGEAL ECHOCARDIOGRAM (TEE);  Surgeon: Pixie Casino, MD;  Location: [facility] ENDOSCOPY;  Service: Cardiovascular;  Laterality: N/A;   HPI:  Pt referred for MBS due to concerns pt may be aspirating. Pt with PMH + dysphagia including :  changes in voice/speech, trouble swallowing pills, poor appetite, holding food in mouth.  Medication list includes (per EPIC) Coreg, Zofran, Reglan, Miralax, Glucophage, Glucerna, Lisinopril.  Pt with PMH + for anxiety, depression, sickle cell trait, HLD, chronic pain, arthritis, CVA 09/05/14 impacting left temportal/parietal lobe (Wernicke's area).  Sister Inez Catalina reports pt has lost weight - losing from 125 to 90 pounds within 4 months.       Assessment / Plan / Recommendation Clinical Impression  Dysphagia Diagnosis: Mild oral phase dysphagia  Clinical impression: Mild oral phase dysphagia suspect due to h/o recent CVA resulting in decreased coordination, impaired mastication and delayed oral transiting.  Premature spillage of boluses into pharynx noted.  Liquids spilled to  level of pyriform sinus inconsistently prior to reflexive swallow trigger.  Trace flash penetration of liquid x1 only noted with sequential swallows.  Pt takes large boluses of liquids which will increase her aspiration risk if dysphagia worsens.  Pharyngeal swallow was timely and strong.    Educated pt and sister to results, strategies to compensate for oral deficits including providing items with better sensory input, assure pt feeds herself.  Sister Inez Catalina reports pt only eats sweets.  SLP advised Inez Catalina to consider using artificial sweetner on foods (with md permission) if it helps with intake given pt's significant weight loss.    Question source of weight loss as this occured prior to her CVA in October 2015.   Recommend consider a dietician consult to help to maximize pt's nutrition.      Treatment Recommendation  Defer treatment plan to SLP at (Comment)    Diet Recommendation Thin liquid;Regular   Liquid Administration via: Cup;Straw (consider provale cup use in future if pt with increased diffiulties with liquids) Medication Administration: Crushed with puree Supervision: Patient able to self feed Compensations: Slow rate;Small sips/bites Postural Changes and/or Swallow Maneuvers: Seated upright 90 degrees;Upright 30-60 min after meal    Other  Recommendations Oral Care Recommendations: Oral care BID   Follow Up Recommendations  Home health SLP      General Date of Onset: 10/07/14 HPI: Pt referred for MBS due to concerns pt may be aspirating. Pt with PMH + dysphagia including :  changes in voice/speech, trouble swallowing pills, poor appetite, holding food in mouth.  Medication list includes (per EPIC) Coreg, Zofran, Reglan, Miralax, Glucophage, Glucerna, Lisinopril.  Pt with PMH + for anxiety, depression, sickle cell trait, HLD, chronic pain, arthritis, CVA 09/05/14 impacting left temportal/parietal lobe (Wernicke's area).  Sister Inez Catalina reports pt has lost weight - losing from 125 to  90 pounds within 4 months.   Type of Study: Modified Barium Swallowing Study Reason for Referral: Objectively evaluate swallowing function Diet Prior to this Study: Regular;Thin liquids Temperature Spikes Noted: No Respiratory Status: Room air History of Recent Intubation: No Behavior/Cognition: Alert;Cooperative;Pleasant mood Oral Cavity - Dentition: Adequate natural dentition Oral Motor / Sensory Function: Impaired motor;Impaired sensory Oral impairment: Right lingual;Right facial;Right labial Self-Feeding Abilities: Able to feed self Patient Positioning: Upright in bed Baseline Vocal Quality: Clear Volitional Cough: Weak Volitional Swallow: Unable to elicit Anatomy: Within functional limits Pharyngeal Secretions: Not observed secondary MBS    Reason for Referral Objectively evaluate swallowing function   Oral Phase Oral Preparation/Oral Phase Oral Phase: Impaired Oral - Nectar Oral - Nectar Cup: Delayed oral transit;Weak lingual manipulation;Lingual pumping;Reduced posterior propulsion Oral - Thin Oral - Thin Teaspoon: Reduced posterior propulsion;Delayed oral transit;Weak lingual manipulation;Lingual pumping Oral - Thin Cup: Delayed oral transit;Weak lingual manipulation;Lingual pumping;Reduced posterior propulsion Oral - Thin Straw: Delayed oral transit;Weak lingual manipulation;Lingual pumping;Reduced posterior propulsion Oral - Solids Oral - Puree: Delayed oral transit;Weak lingual manipulation;Lingual pumping;Reduced posterior propulsion Oral - Regular: Delayed oral transit;Impaired mastication;Weak lingual manipulation;Lingual pumping;Reduced posterior propulsion   Pharyngeal Phase Pharyngeal Phase Pharyngeal Phase: Impaired Pharyngeal - Nectar Pharyngeal - Nectar Cup: Premature spillage to valleculae Pharyngeal - Thin Pharyngeal - Thin Teaspoon: Premature spillage to valleculae Pharyngeal - Thin Cup: Penetration/Aspiration during swallow;Premature spillage to pyriform  sinuses;Premature spillage to valleculae Penetration/Aspiration details (thin cup): Material enters airway, remains ABOVE vocal cords then ejected out Pharyngeal - Thin Straw: Premature spillage to pyriform sinuses;Premature spillage to valleculae Pharyngeal - Solids Pharyngeal - Puree: Premature spillage to valleculae Pharyngeal - Regular: Premature spillage to valleculae Pharyngeal Phase - Comment Pharyngeal Comment: pharyngeal swallow was strong without residuals  Cervical Esophageal Phase    GO    Cervical Esophageal Phase Cervical Esophageal Phase: WFL (esophageal sweep x1 appeared clear)    Functional Assessment Tool Used: clinical judgement, MBS Functional Limitations: Swallowing Swallow Current Status (X6553): At least 1 percent but less than 20 percent impaired, limited or restricted Swallow Goal Status [phone]): At least 1 percent but less than 20 percent impaired, limited or restricted Swallow Discharge Status [phone]): At least 1 percent but less than 20 percent impaired, limited or restricted    Luanna Salk, [facility] SLP [phone]

## 2014-10-14 ENCOUNTER — Encounter: Payer: Self-pay | Admitting: Internal Medicine

## 2014-10-14 ENCOUNTER — Ambulatory Visit (INDEPENDENT_AMBULATORY_CARE_PROVIDER_SITE_OTHER): Payer: Medicare Other | Admitting: Internal Medicine

## 2014-10-14 VITALS — BP 100/60 | HR 98 | Ht 64.0 in | Wt 92.5 lb

## 2014-10-14 DIAGNOSIS — R9439 Abnormal result of other cardiovascular function study: Secondary | ICD-10-CM

## 2014-10-14 DIAGNOSIS — Z9114 Patient's other noncompliance with medication regimen: Secondary | ICD-10-CM | POA: Diagnosis not present

## 2014-10-14 DIAGNOSIS — I639 Cerebral infarction, unspecified: Secondary | ICD-10-CM

## 2014-10-14 DIAGNOSIS — R9389 Abnormal findings on diagnostic imaging of other specified body structures: Secondary | ICD-10-CM

## 2014-10-14 DIAGNOSIS — I429 Cardiomyopathy, unspecified: Secondary | ICD-10-CM

## 2014-10-14 DIAGNOSIS — R938 Abnormal findings on diagnostic imaging of other specified body structures: Secondary | ICD-10-CM

## 2014-10-14 DIAGNOSIS — I10 Essential (primary) hypertension: Secondary | ICD-10-CM | POA: Diagnosis not present

## 2014-10-14 DIAGNOSIS — I428 Other cardiomyopathies: Secondary | ICD-10-CM

## 2014-10-14 NOTE — Patient Instructions (Signed)
Your physician has requested that you have an echocardiogram. Echocardiography is a painless test that uses sound waves to create images of your heart. It provides your doctor with information about the size and shape of your heart and how well your heart's chambers and valves are working. This procedure takes approximately one hour. There are no restrictions for this procedure. >> in 6 months  Your physician wants you to follow-up in: 6 months with Dr. Debara Pickett. You will receive a reminder letter in the mail two months in advance. If you don't receive a letter, please call our office to schedule the follow-up appointment.

## 2014-10-17 ENCOUNTER — Encounter: Payer: Self-pay | Admitting: Internal Medicine

## 2014-10-17 DIAGNOSIS — R9439 Abnormal result of other cardiovascular function study: Secondary | ICD-10-CM | POA: Insufficient documentation
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
## 2014-10-18 ENCOUNTER — Ambulatory Visit (INDEPENDENT_AMBULATORY_CARE_PROVIDER_SITE_OTHER): Payer: Medicare Other | Admitting: Nurse Practitioner

## 2014-10-18 ENCOUNTER — Encounter: Payer: Self-pay | Admitting: Nurse Practitioner

## 2014-10-18 VITALS — BP 114/79 | HR 112 | Ht 66.5 in | Wt 93.6 lb

## 2014-10-18 DIAGNOSIS — D573 Sickle-cell trait: Secondary | ICD-10-CM

## 2014-10-18 DIAGNOSIS — I69319 Unspecified symptoms and signs involving cognitive functions following cerebral infarction: Secondary | ICD-10-CM | POA: Insufficient documentation

## 2014-10-18 DIAGNOSIS — IMO0002 Reserved for concepts with insufficient information to code with codable children: Secondary | ICD-10-CM

## 2014-10-18 DIAGNOSIS — I429 Cardiomyopathy, unspecified: Secondary | ICD-10-CM

## 2014-10-18 DIAGNOSIS — G8929 Other chronic pain: Secondary | ICD-10-CM

## 2014-10-18 DIAGNOSIS — I6981 Cognitive deficits following other cerebrovascular disease: Secondary | ICD-10-CM

## 2014-10-18 DIAGNOSIS — Z8673 Personal history of transient ischemic attack (TIA), and cerebral infarction without residual deficits: Secondary | ICD-10-CM | POA: Insufficient documentation

## 2014-10-18 DIAGNOSIS — I639 Cerebral infarction, unspecified: Secondary | ICD-10-CM

## 2014-10-18 DIAGNOSIS — I69920 Aphasia following unspecified cerebrovascular disease: Secondary | ICD-10-CM

## 2014-10-18 NOTE — Patient Instructions (Addendum)
Continue aspirin 325 mg orally every day  for secondary stroke prevention and maintain strict control of hypertension with blood pressure goal below 130/90, diabetes with hemoglobin A1c goal below 6.5% and lipids with LDL cholesterol goal below 70 mg/dL.  Continue Home Health PT/OT and Speech Therapy. Outpatient therapies are recommended when home health finishes. Please see your Primary MD for Pain medication. Followup in the future with me Dr. Erlinda Hong in 3 months, sooner as needed.    Stroke Prevention Some medical conditions and behaviors are associated with an increased chance of having a stroke. You may prevent a stroke by making healthy choices and managing medical conditions. HOW CAN I REDUCE MY RISK OF HAVING A STROKE?   Stay physically active. Get at least 30 minutes of activity on most or all days.  Do not smoke. It may also be helpful to avoid exposure to secondhand smoke.  Limit alcohol use. Moderate alcohol use is considered to be:  No more than 2 drinks per day for men.  No more than 1 drink per day for nonpregnant women.  Eat healthy foods. This involves:  Eating 5 or more servings of fruits and vegetables a day.  Making dietary changes that address high blood pressure (hypertension), high cholesterol, diabetes, or obesity.  Manage your cholesterol levels.  Making food choices that are high in fiber and low in saturated fat, trans fat, and cholesterol may control cholesterol levels.  Take any prescribed medicines to control cholesterol as directed by your health care provider.  Manage your diabetes.  Controlling your carbohydrate and sugar intake is recommended to manage diabetes.  Take any prescribed medicines to control diabetes as directed by your health care provider.  Control your hypertension.  Making food choices that are low in salt (sodium), saturated fat, trans fat, and cholesterol is recommended to manage hypertension.  Take any prescribed medicines to  control hypertension as directed by your health care provider.  Maintain a healthy weight.  Reducing calorie intake and making food choices that are low in sodium, saturated fat, trans fat, and cholesterol are recommended to manage weight.  Stop drug abuse.  Avoid taking birth control pills.  Talk to your health care provider about the risks of taking birth control pills if you are over 60 years old, smoke, get migraines, or have ever had a blood clot.  Get evaluated for sleep disorders (sleep apnea).  Talk to your health care provider about getting a sleep evaluation if you snore a lot or have excessive sleepiness.  Take medicines only as directed by your health care provider.  For some people, aspirin or blood thinners (anticoagulants) are helpful in reducing the risk of forming abnormal blood clots that can lead to stroke. If you have the irregular heart rhythm of atrial fibrillation, you should be on a blood thinner unless there is a good reason you cannot take them.  Understand all your medicine instructions.  Make sure that other conditions (such as anemia or atherosclerosis) are addressed. SEEK IMMEDIATE MEDICAL CARE IF:   You have sudden weakness or numbness of the face, arm, or leg, especially on one side of the body.  Your face or eyelid droops to one side.  You have sudden confusion.  You have trouble speaking (aphasia) or understanding.  You have sudden trouble seeing in one or both eyes.  You have sudden trouble walking.  You have dizziness.  You have a loss of balance or coordination.  You have a sudden, severe headache with  no known cause.  You have new chest pain or an irregular heartbeat. Any of these symptoms may represent a serious problem that is an emergency. Do not wait to see if the symptoms will go away. Get medical help at once. Call your local emergency services (911 in [place] Do not drive yourself to the hospital. Document Released: 12/19/2004  Document Revised: 03/28/2014 Document Reviewed: 05/14/2013 [facility] Patient Information 2015 [place], [place]. This information is not intended to replace advice given to you by your health care provider. Make sure you discuss any questions you have with your health care provider.

## 2014-10-18 NOTE — Progress Notes (Signed)
PATIENT: Kristen Lozano DOB: 04-18-1956  REASON FOR VISIT: hospital follow up for stroke HISTORY FROM: patient and husband  HISTORY OF PRESENT ILLNESS: Kristen Lozano is an 58 y.o. female who comes to the office for first hospital follow up post hospital discharge for stroke. She is accompanied by her husband. She has a past medical history significant for HTN, DM type 2, sickle cell trait, OA, brought in by family due to new onset language impairment and confusion on 09/03/14.  Never had similar symptoms before. Husband is at the bedside and said that she was doing well when he left the house around 1 pm yesterday 09/03/2014 but when he came back she was confused, naked, not acting right, and answering yes/no to all questions. He did not notice facial droop and said that she was walking around the house without any trouble except for the confusion and inability to engage in conversation. CT brain after arrival to the ED showed a wedge of low-attenuation extending from the lower left sylvian fissure through the putamen, anterior limb internal capsule, and into the left caudate head. She is alert, awake, moving all limbs, but globally aphasic. Patient was not administered TPA secondary to late presentation. She was admitted for further evaluation and treatment. MRAshows small vessel disease. Hypercoagulable and vasculitis w/up sent : ESR normal, SSA negative, Myeloperoxide antibodies negative, ANCA negative, RPR non reactive, HIV non reactive, anticardiolipin low, antithrombin III negative, Factor V leiden, lupus anticoagulant, Protein S, C , B 2 globulin negative. Carotid dopplerwith no significant stenosis, 2D Echowith no source of embolus. LDL 75, on Lipitor. HgbA1c 6.2. Started on Metformin at discharge. TEE showed EF 30-35%, received cardiology consult. She was recommended for CIR, but discharged home with Home Health.  Dr. Erlinda Hong recommended coumadin for secondary stroke prevention. After discussed  with husband and sister, they prefer aspirin for now. She still continues to struggle with receptive and expressive aphasia.   REVIEW OF SYSTEMS: Full 14 system review of systems performed and notable only for: joint pain, receptive aphasia, expressive aphasia.  ALLERGIES: No Known Allergies  HOME MEDICATIONS: Outpatient Prescriptions Prior to Visit  Medication Sig Dispense Refill  . ALPRAZolam (XANAX XR) 2 MG 24 hr tablet Take 2 mg by mouth daily.    Marland Kitchen aspirin 325 MG tablet Take 1 tablet (325 mg total) by mouth daily. 30 tablet 0  . atorvastatin (LIPITOR) 20 MG tablet Take 20 mg by mouth daily at 6 PM.    . carvedilol (COREG) 12.5 MG tablet Take 1 tablet (12.5 mg total) by mouth 2 (two) times daily with a meal. 60 tablet 0  . feeding supplement, GLUCERNA SHAKE, (GLUCERNA SHAKE) LIQD Take 237 mLs by mouth 3 (three) times daily with meals. 30 Can 2  . lisinopril (PRINIVIL,ZESTRIL) 10 MG tablet Take 10 mg by mouth daily.    . metFORMIN (GLUCOPHAGE) 1000 MG tablet Take 1,000 mg by mouth 2 (two) times daily as needed (when insulin is not available).     Marland Kitchen oxyCODONE-acetaminophen (PERCOCET) 10-325 MG per tablet Take 1 tablet by mouth 3 (three) times daily as needed. For pain    . metoCLOPramide (REGLAN) 10 MG tablet Take 1 tablet (10 mg total) by mouth 4 (four) times daily -  before meals and at bedtime. 30 tablet 0  . ondansetron (ZOFRAN ODT) 8 MG disintegrating tablet Take 1 tablet (8 mg total) by mouth every 8 (eight) hours as needed for nausea or vomiting. 20 tablet 0  .  polyethylene glycol (MIRALAX / GLYCOLAX) packet Take 17 g by mouth daily. 14 each 0   No facility-administered medications prior to visit.    PHYSICAL EXAM Filed Vitals:   10/18/14 1014  BP: 114/79  Pulse: 112  Height: 5' 6.5" (1.689 m)  Weight: 93 lb 9.6 oz (42.457 kg)   Body mass index is 14.88 kg/(m^2).  Visual Acuity Screening   Right eye Left eye Both eyes  Without correction: 20/200 20/200 20/200  With  correction:      Generalized: Well developed, in no acute distress. Frail, cachectic appearing AA female. Head: normocephalic and atraumatic. Oropharynx benign  Neck: Supple, no carotid bruits  Cardiac: Tachy rate, normal rhythm, no murmur  Musculoskeletal: No deformity   Neurological examination  Mentation: Alert.  Orientation is difficult to determine. Follows 1 step commands only - and not consistently. Slower processing time. Speech is aphasic and language non- fluent. Cranial nerve II-XII: Fundoscopic exam not done. Pupils were equal round reactive to light extraocular movements were full, visual field were full on confrontational test. Facial sensation and strength were normal. hearing was intact to finger rubbing bilaterally. Uvula tongue midline. head turning and shoulder shrug and were normal and symmetric.Tongue protrusion into cheek strength was normal. Motor: The motor testing reveals 5 over 5 strength of all 4 extremities. Good symmetric motor tone is noted throughout.  Sensory: Sensory testing is intact to soft touch on all 4 extremities. No evidence of extinction is noted.  Coordination: Cerebellar testing reveals good finger-nose-finger and heel-to-shin bilaterally.  Gait and station: Gait is normal. Tandem gait is normal. Romberg is negative. Reflexes: Deep tendon reflexes are symmetric and normal bilaterally.   DIAGNOSTIC DATA (LABS, IMAGING, TESTING) - I reviewed patient records, labs, notes, testing and imaging myself where available.  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   Mr Jodene Nam Head Wo Contrast 09/05/2014 1. Acute nonhemorrhagic infarct involving the anterior left lentiform nucleus and caudate head. 2. Acute nonhemorrhagic infarct involving the posterior left temporal and parietal lobe, potentially affecting Wernicke's area. 3. Distal small vessel disease is apparent on the MRA without  significant proximal stenosis, aneurysm, or branch vessel occlusion. 4. The right A1 segment is hypoplastic or possibly stenosed. The left A1 segment is dominant.   Dg Chest Portable 1 View 09/04/2014 No acute cardiopulmonary process.   Carotid Doppler No evidence of hemodynamically significant internal carotid artery stenosis. Vertebral artery flow is antegrade.   2D Echocardiogram LVEF 35-40%, severe global hypokinesis, normal wall thickness and chamber sizes, mild MR, diastolic dysfunction with normal LV filling pressure.  Venous doppler - Bilateral: No evidence of DVT, superficial thrombosis, or Baker's Cyst.  TEE - 1. No LAA thrombus. 2. No PFO 3. Severe global hypokinesis - LVEF 30-35%  Pan CT non-revealing for malignancy  Ischemic Hypercoagulable & Vasculitic Workup Normal - RPR, ESR, HIV, antithrombin III, Homocysteine, C3, C4 Pending - Protein C & S, CH50, ANA, lupus anticoagulant, cardiolipin antibody, prothrombin gene mutation, ANCA screen, Mpo/pr-3 (anca), SSA & SSB  ASSESSMENT: Ms. RHYAN WOLTERS is a 58 y.o. female with history of HTN, DM type 2, sickle cell trait, OA, presenting with new onset language impairment and confusion on 09/03/14. MRIrevealved an anterior left lentiform nucleus and caudate head infarct and posterior left temporal and parietal lobe infarct. Resultant expressive and receptive aphasia, cognitive difficulties.  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 She was recommended in the hospital to start Coumadin therapy by Dr. Erlinda Hong but family declined, preferred aspirin.  Continue aspirin 325 mg orally every day  for secondary stroke prevention and maintain strict control of hypertension with blood pressure goal below 130/90,  diabetes with hemoglobin A1c goal below 6.5% and lipids with LDL cholesterol goal below 70 mg/dL.  Outpatient therapies, OT and [facility] at [facility]. Followup in the future with Dr. Erlinda Hong in 3 months, sooner as needed.  Orders Placed This Encounter  Procedures  . Ambulatory referral to Speech Therapy  . Ambulatory referral to Occupational Therapy   Rudi Rummage Marijo Quizon, MSN, FNP-BC, A/GNP-C 10/18/2014, 3:25 PM [facility] [address] [place], [place] [address] [phone]  Note: This document was prepared with digital dictation and possible smart phrase technology. Any transcriptional errors that result from this process are unintentional.

## 2014-10-19 ENCOUNTER — Ambulatory Visit (INDEPENDENT_AMBULATORY_CARE_PROVIDER_SITE_OTHER): Payer: Medicare Other | Admitting: Gastroenterology

## 2014-10-19 ENCOUNTER — Encounter: Payer: Self-pay | Admitting: Gastroenterology

## 2014-10-19 VITALS — BP 94/50 | HR 108 | Ht 64.25 in | Wt 91.4 lb

## 2014-10-19 DIAGNOSIS — K5900 Constipation, unspecified: Secondary | ICD-10-CM

## 2014-10-19 DIAGNOSIS — R935 Abnormal findings on diagnostic imaging of other abdominal regions, including retroperitoneum: Secondary | ICD-10-CM

## 2014-10-19 DIAGNOSIS — I639 Cerebral infarction, unspecified: Secondary | ICD-10-CM

## 2014-10-19 DIAGNOSIS — I69391 Dysphagia following cerebral infarction: Secondary | ICD-10-CM

## 2014-10-19 NOTE — Patient Instructions (Signed)
You will need a follow up MRI/MRCP in 6 months  You will need follow up labs (LFT's) in 6 months We will contact you with a reminder call and that appointment when it comes closer to 6 months  Increase Miralax to 2-3 times daily as needed and decrease as needed  You will need to contact Neurology about your follow up for occupational therapy - speech therapy

## 2014-10-19 NOTE — Progress Notes (Signed)
History of Present Illness: This is a 58 year old female returning for follow-up for dysphagia and an abnormal bile duct on imaging accompanied by her sister. Her sister does all talking for the patient as the patient is aphasic post CVA. She was evaluated by neurology yesterday. I have reviewed that note. I reviewed the results of all the results in detail  with the patient and her sister. The patient's main complaints today are difficulty swallowing and constipation.  IMPRESSION Barium Esophagram: 1. Negative esophagram. 2. Despite 2 attempts the patient was unable to swallow a 12.5 mm barium tablet, and this appears to be due to difficulty with the oral phase of swallowing. No aspiration of barium or choking occurred.  MBSS: Dysphagia Diagnosis: Mild oral phase dysphagia  Clinical impression: Mild oral phase dysphagia suspect due to h/o recent CVA resulting in decreased coordination, impaired mastication and delayed oral transiting. Premature spillage of boluses into pharynx noted. Liquids spilled to level of pyriform sinus inconsistently prior to reflexive swallow trigger. Trace flash penetration of liquid x1 only noted with sequential swallows. Pt takes large boluses of liquids which will increase her aspiration risk if dysphagia worsens. Pharyngeal swallow was timely and strong.   Educated pt and sister to results, strategies to compensate for oral deficits including providing items with better sensory input, assure pt feeds herself. Sister Inez Catalina reports pt only eats sweets. SLP advised Inez Catalina to consider using artificial sweetner on foods (with md permission) if it helps with intake given pt's significant weight loss.   Question source of weight loss as this occured prior to her CVA in October 2015. Recommend consider a dietician consult to help to maximize pt's nutrition.   MRI/MRCP IMPRESSION: 1. Fusiform dilatation of the mid common bile duct with smooth distal tapering.  Differential includes a benign stricturing of the distal common bile duct versus a type 1 choledochal cyst. No intrahepatic bile duct dilatation. 2. No evidence of pancreatic mass. 3. Peripheral wedge-shaped defects within both kidneys associated with the cortical retraction suggests renal cortical infarctions versus chronic vesicoureteral reflux. Favor renal infarctions. Recommend cardiac evaluation for source of emboli.   Current Medications, Allergies, Past Medical History, Past Surgical History, Family History and Social History were reviewed in [employer] record.  Physical Exam: General: Well developed , well nourished, no acute distress Head: Normocephalic and atraumatic Eyes:  sclerae anicteric, EOMI Ears: Normal auditory acuity Mouth: No deformity or lesions Lungs: Clear throughout to auscultation Heart: Regular rate and rhythm; no murmurs, rubs or bruits Abdomen: Soft, non tender and non distended. No masses, hepatosplenomegaly or hernias noted. Normal Bowel sounds Musculoskeletal: Symmetrical with no gross deformities  Pulses:  Normal pulses noted Extremities: No clubbing, cyanosis, edema or deformities noted Neurological: Alert oriented x 4, grossly nonfocal Psychological:  Alert and cooperative. Normal mood and affect  Assessment and Recommendations:  1. Oral phase dysphagia related to recent CVA. No esophageal disorders uncovered. Follow-up with neurology. OT and ST rehab as per recommendations in the neurologist's office note from yesterday. Ongoing follow-up with neurology.  2. Abnormal imaging of common bile duct. This may be a benign anatomic variant or a type I choledochal cyst. Her LFTs are normal. Plan to repeat LFTs and MRCP in 6 months. Consider referral to a tertiary center for further evaluation and management of this problem.   3 Constipation. Increase MiraLAX to 2 or 3 times daily titrated for adequate bowel movements follow-up with  PCP.  4. Abnormal renal imaging on MRI.  Further follow-up and management with her PCP.

## 2014-10-28 ENCOUNTER — Other Ambulatory Visit: Payer: Self-pay | Admitting: *Deleted

## 2014-10-28 ENCOUNTER — Ambulatory Visit: Payer: Medicare Other | Admitting: Neurology

## 2014-10-28 MED ORDER — ATORVASTATIN CALCIUM 20 MG PO TABS: 20.0000 mg | ORAL_TABLET | Freq: Every day | ORAL | Status: DC

## 2014-10-28 NOTE — Telephone Encounter (Signed)
Rx was sent to pharmacy electronically. 

## 2014-11-01 ENCOUNTER — Other Ambulatory Visit: Payer: Self-pay

## 2014-11-01 MED ORDER — LISINOPRIL 10 MG PO TABS: 10.0000 mg | ORAL_TABLET | Freq: Every day | ORAL | Status: DC

## 2014-11-01 MED ORDER — CARVEDILOL 12.5 MG PO TABS: 12.5000 mg | ORAL_TABLET | Freq: Two times a day (BID) | ORAL | Status: DC

## 2014-11-01 NOTE — Telephone Encounter (Signed)
Rx was sent to pharmacy electronically. 

## 2014-11-02 ENCOUNTER — Telehealth: Payer: Self-pay

## 2014-11-02 MED ORDER — CARVEDILOL 12.5 MG PO TABS: 12.5000 mg | ORAL_TABLET | Freq: Two times a day (BID) | ORAL | Status: DC

## 2014-11-02 NOTE — Telephone Encounter (Signed)
Rx was sent to pharmacy electronically. 

## 2014-11-28 ENCOUNTER — Ambulatory Visit: Payer: No Typology Code available for payment source

## 2014-11-28 ENCOUNTER — Ambulatory Visit: Payer: No Typology Code available for payment source | Attending: Nurse Practitioner | Admitting: Occupational Therapy

## 2014-12-21 NOTE — Progress Notes (Signed)
I reviewed above note and agree with the assessment and plan.  Rosalin Hawking, MD PhD Stroke Neurology 12/21/2014 11:42 AM

## 2015-01-20 ENCOUNTER — Ambulatory Visit: Payer: Medicaid Other | Admitting: Neurology

## 2015-01-23 ENCOUNTER — Encounter: Payer: Self-pay | Admitting: Neurology

## 2015-02-28 ENCOUNTER — Other Ambulatory Visit: Payer: Self-pay

## 2015-02-28 DIAGNOSIS — R935 Abnormal findings on diagnostic imaging of other abdominal regions, including retroperitoneum: Secondary | ICD-10-CM

## 2015-03-02 ENCOUNTER — Other Ambulatory Visit (INDEPENDENT_AMBULATORY_CARE_PROVIDER_SITE_OTHER): Payer: Medicaid Other

## 2015-03-02 DIAGNOSIS — R935 Abnormal findings on diagnostic imaging of other abdominal regions, including retroperitoneum: Secondary | ICD-10-CM | POA: Diagnosis not present

## 2015-03-02 LAB — HEPATIC FUNCTION PANEL
ALBUMIN: 4.1 g/dL (ref 3.5–5.2)
ALK PHOS: 50 U/L (ref 39–117)
ALT: 6 U/L (ref 0–35)
AST: 10 U/L (ref 0–37)
Bilirubin, Direct: 0.1 mg/dL (ref 0.0–0.3)
TOTAL PROTEIN: 7.7 g/dL (ref 6.0–8.3)
Total Bilirubin: 0.4 mg/dL (ref 0.2–1.2)

## 2015-03-02 LAB — CREATININE, SERUM: CREATININE: 0.63 mg/dL (ref 0.40–1.20)

## 2015-03-08 ENCOUNTER — Ambulatory Visit (HOSPITAL_COMMUNITY)
Admission: RE | Admit: 2015-03-08 | Discharge: 2015-03-08 | Disposition: A | Discharge status: Home / Self Care | Payer: Medicare Other | Source: Ambulatory Visit | Attending: Gastroenterology | Admitting: Gastroenterology

## 2015-03-08 ENCOUNTER — Other Ambulatory Visit: Payer: Self-pay | Admitting: Gastroenterology

## 2015-03-08 DIAGNOSIS — R935 Abnormal findings on diagnostic imaging of other abdominal regions, including retroperitoneum: Secondary | ICD-10-CM | POA: Insufficient documentation

## 2015-03-08 DIAGNOSIS — K838 Other specified diseases of biliary tract: Secondary | ICD-10-CM | POA: Diagnosis present

## 2015-03-08 MED ORDER — GADOBENATE DIMEGLUMINE 529 MG/ML IV SOLN
10.0000 mL | Freq: Once | INTRAVENOUS | Status: AC | PRN
  Administered 2015-03-08: 10 mL via INTRAVENOUS

## 2015-07-14 ENCOUNTER — Other Ambulatory Visit: Payer: Self-pay | Admitting: *Deleted

## 2015-07-14 ENCOUNTER — Other Ambulatory Visit: Payer: Self-pay

## 2015-07-14 MED ORDER — ATORVASTATIN CALCIUM 20 MG PO TABS: 20.0000 mg | ORAL_TABLET | Freq: Every day | ORAL | Status: DC

## 2015-07-14 MED ORDER — CARVEDILOL 12.5 MG PO TABS: 12.5000 mg | ORAL_TABLET | Freq: Two times a day (BID) | ORAL | Status: DC

## 2015-07-14 MED ORDER — METFORMIN HCL 1000 MG PO TABS: 1000.0000 mg | ORAL_TABLET | Freq: Two times a day (BID) | ORAL | Status: DC | PRN

## 2015-07-14 NOTE — Telephone Encounter (Signed)
Rx(s) sent to pharmacy electronically.  

## 2015-08-16 ENCOUNTER — Other Ambulatory Visit: Payer: Self-pay | Admitting: Internal Medicine

## 2015-08-16 NOTE — Telephone Encounter (Signed)
Rx has been sent to the pharmacy electronically. ° °

## 2015-09-13 ENCOUNTER — Other Ambulatory Visit: Payer: Self-pay | Admitting: Internal Medicine

## 2015-09-13 DIAGNOSIS — E119 Type 2 diabetes mellitus without complications: Secondary | ICD-10-CM

## 2015-09-14 NOTE — Telephone Encounter (Signed)
Ok to refill? Please advise. Thanks, MI 

## 2015-10-10 ENCOUNTER — Other Ambulatory Visit: Payer: Self-pay | Admitting: Internal Medicine

## 2015-11-06 ENCOUNTER — Other Ambulatory Visit: Payer: Self-pay | Admitting: *Deleted

## 2015-11-06 MED ORDER — CARVEDILOL 12.5 MG PO TABS: 12.5000 mg | ORAL_TABLET | Freq: Two times a day (BID) | ORAL | Status: DC

## 2015-11-06 NOTE — Telephone Encounter (Signed)
Electronic refills on carvedilol sent to [place].

## 2015-11-09 ENCOUNTER — Other Ambulatory Visit: Payer: Self-pay | Admitting: Internal Medicine

## 2015-11-09 NOTE — Telephone Encounter (Signed)
Rx(s) sent to pharmacy electronically.  

## 2016-10-28 ENCOUNTER — Inpatient Hospital Stay (HOSPITAL_COMMUNITY)
Admission: EM | Admit: 2016-10-28 | Discharge: 2016-11-01 | DRG: 637 | Disposition: A | Discharge status: Home Health | Payer: Medicare Other | Attending: Internal Medicine | Admitting: Internal Medicine

## 2016-10-28 ENCOUNTER — Emergency Department (HOSPITAL_COMMUNITY): Payer: Medicare Other

## 2016-10-28 ENCOUNTER — Inpatient Hospital Stay (HOSPITAL_COMMUNITY): Payer: Medicare Other

## 2016-10-28 ENCOUNTER — Encounter (HOSPITAL_COMMUNITY): Payer: Self-pay

## 2016-10-28 DIAGNOSIS — Z9119 Patient's noncompliance with other medical treatment and regimen: Secondary | ICD-10-CM

## 2016-10-28 DIAGNOSIS — Z8673 Personal history of transient ischemic attack (TIA), and cerebral infarction without residual deficits: Secondary | ICD-10-CM | POA: Diagnosis present

## 2016-10-28 DIAGNOSIS — I5022 Chronic systolic (congestive) heart failure: Secondary | ICD-10-CM | POA: Diagnosis not present

## 2016-10-28 DIAGNOSIS — Z7982 Long term (current) use of aspirin: Secondary | ICD-10-CM

## 2016-10-28 DIAGNOSIS — I513 Intracardiac thrombosis, not elsewhere classified: Secondary | ICD-10-CM | POA: Diagnosis present

## 2016-10-28 DIAGNOSIS — Z841 Family history of disorders of kidney and ureter: Secondary | ICD-10-CM

## 2016-10-28 DIAGNOSIS — Z7984 Long term (current) use of oral hypoglycemic drugs: Secondary | ICD-10-CM

## 2016-10-28 DIAGNOSIS — Z800 Family history of malignant neoplasm of digestive organs: Secondary | ICD-10-CM

## 2016-10-28 DIAGNOSIS — I110 Hypertensive heart disease with heart failure: Secondary | ICD-10-CM | POA: Diagnosis present

## 2016-10-28 DIAGNOSIS — Z8041 Family history of malignant neoplasm of ovary: Secondary | ICD-10-CM | POA: Diagnosis not present

## 2016-10-28 DIAGNOSIS — J309 Allergic rhinitis, unspecified: Secondary | ICD-10-CM | POA: Diagnosis present

## 2016-10-28 DIAGNOSIS — I255 Ischemic cardiomyopathy: Secondary | ICD-10-CM | POA: Diagnosis not present

## 2016-10-28 DIAGNOSIS — Z818 Family history of other mental and behavioral disorders: Secondary | ICD-10-CM | POA: Diagnosis not present

## 2016-10-28 DIAGNOSIS — I429 Cardiomyopathy, unspecified: Secondary | ICD-10-CM

## 2016-10-28 DIAGNOSIS — R7989 Other specified abnormal findings of blood chemistry: Secondary | ICD-10-CM | POA: Diagnosis not present

## 2016-10-28 DIAGNOSIS — E1310 Other specified diabetes mellitus with ketoacidosis without coma: Secondary | ICD-10-CM | POA: Diagnosis not present

## 2016-10-28 DIAGNOSIS — E1110 Type 2 diabetes mellitus with ketoacidosis without coma: Principal | ICD-10-CM | POA: Diagnosis present

## 2016-10-28 DIAGNOSIS — E860 Dehydration: Secondary | ICD-10-CM | POA: Diagnosis not present

## 2016-10-28 DIAGNOSIS — R748 Abnormal levels of other serum enzymes: Secondary | ICD-10-CM | POA: Diagnosis not present

## 2016-10-28 DIAGNOSIS — G9340 Encephalopathy, unspecified: Secondary | ICD-10-CM | POA: Diagnosis present

## 2016-10-28 DIAGNOSIS — I248 Other forms of acute ischemic heart disease: Secondary | ICD-10-CM | POA: Diagnosis present

## 2016-10-28 DIAGNOSIS — F329 Major depressive disorder, single episode, unspecified: Secondary | ICD-10-CM | POA: Diagnosis present

## 2016-10-28 DIAGNOSIS — I236 Thrombosis of atrium, auricular appendage, and ventricle as current complications following acute myocardial infarction: Secondary | ICD-10-CM | POA: Diagnosis present

## 2016-10-28 DIAGNOSIS — E785 Hyperlipidemia, unspecified: Secondary | ICD-10-CM | POA: Diagnosis present

## 2016-10-28 DIAGNOSIS — F411 Generalized anxiety disorder: Secondary | ICD-10-CM | POA: Diagnosis present

## 2016-10-28 DIAGNOSIS — R9431 Abnormal electrocardiogram [ECG] [EKG]: Secondary | ICD-10-CM | POA: Diagnosis not present

## 2016-10-28 DIAGNOSIS — R2681 Unsteadiness on feet: Secondary | ICD-10-CM

## 2016-10-28 DIAGNOSIS — Z833 Family history of diabetes mellitus: Secondary | ICD-10-CM

## 2016-10-28 DIAGNOSIS — Z8249 Family history of ischemic heart disease and other diseases of the circulatory system: Secondary | ICD-10-CM | POA: Diagnosis not present

## 2016-10-28 DIAGNOSIS — I69320 Aphasia following cerebral infarction: Secondary | ICD-10-CM

## 2016-10-28 DIAGNOSIS — D573 Sickle-cell trait: Secondary | ICD-10-CM | POA: Diagnosis present

## 2016-10-28 DIAGNOSIS — I10 Essential (primary) hypertension: Secondary | ICD-10-CM | POA: Diagnosis not present

## 2016-10-28 DIAGNOSIS — R4182 Altered mental status, unspecified: Secondary | ICD-10-CM | POA: Diagnosis not present

## 2016-10-28 DIAGNOSIS — I428 Other cardiomyopathies: Secondary | ICD-10-CM | POA: Diagnosis present

## 2016-10-28 DIAGNOSIS — E119 Type 2 diabetes mellitus without complications: Secondary | ICD-10-CM | POA: Diagnosis present

## 2016-10-28 DIAGNOSIS — R000 Tachycardia, unspecified: Secondary | ICD-10-CM | POA: Diagnosis present

## 2016-10-28 DIAGNOSIS — R739 Hyperglycemia, unspecified: Secondary | ICD-10-CM

## 2016-10-28 DIAGNOSIS — G8929 Other chronic pain: Secondary | ICD-10-CM | POA: Diagnosis present

## 2016-10-28 DIAGNOSIS — I639 Cerebral infarction, unspecified: Secondary | ICD-10-CM | POA: Diagnosis not present

## 2016-10-28 DIAGNOSIS — E1100 Type 2 diabetes mellitus with hyperosmolarity without nonketotic hyperglycemic-hyperosmolar coma (NKHHC): Secondary | ICD-10-CM | POA: Diagnosis present

## 2016-10-28 DIAGNOSIS — E876 Hypokalemia: Secondary | ICD-10-CM | POA: Diagnosis not present

## 2016-10-28 DIAGNOSIS — R778 Other specified abnormalities of plasma proteins: Secondary | ICD-10-CM | POA: Diagnosis present

## 2016-10-28 HISTORY — DX: Hyperglycemia, unspecified: R73.9

## 2016-10-28 HISTORY — DX: Heart failure, unspecified: I50.9

## 2016-10-28 LAB — BASIC METABOLIC PANEL
ANION GAP: 14 (ref 5–15)
BUN: 11 mg/dL (ref 6–20)
CALCIUM: 9 mg/dL (ref 8.9–10.3)
CO2: 18 mmol/L — ABNORMAL LOW (ref 22–32)
Chloride: 107 mmol/L (ref 101–111)
Creatinine, Ser: 0.93 mg/dL (ref 0.44–1.00)
Glucose, Bld: 530 mg/dL (ref 65–99)
Potassium: 3.9 mmol/L (ref 3.5–5.1)
Sodium: 139 mmol/L (ref 135–145)

## 2016-10-28 LAB — URINALYSIS, ROUTINE W REFLEX MICROSCOPIC
BILIRUBIN URINE: NEGATIVE
Glucose, UA: >1000 mg/dL — AB
Hgb urine dipstick: NEGATIVE
Ketones, ur: 40 mg/dL — AB
Leukocytes, UA: NEGATIVE
NITRITE: NEGATIVE
Protein, ur: NEGATIVE mg/dL
SPECIFIC GRAVITY, URINE: 1.027 (ref 1.005–1.030)
pH: 5.5 (ref 5.0–8.0)

## 2016-10-28 LAB — CBC WITH DIFFERENTIAL/PLATELET
BASOS ABS: 0 10*3/uL (ref 0.0–0.1)
Basophils Relative: 0 %
Eosinophils Absolute: 0 10*3/uL (ref 0.0–0.7)
Eosinophils Relative: 0 %
HEMATOCRIT: 42.2 % (ref 36.0–46.0)
Hemoglobin: 14.2 g/dL (ref 12.0–15.0)
LYMPHS ABS: 1 10*3/uL (ref 0.7–4.0)
Lymphocytes Relative: 8 %
MCH: 22.3 pg — ABNORMAL LOW (ref 26.0–34.0)
MCHC: 33.6 g/dL (ref 30.0–36.0)
MCV: 66.1 fL — ABNORMAL LOW (ref 78.0–100.0)
MONOS PCT: 2 %
Monocytes Absolute: 0.3 10*3/uL (ref 0.1–1.0)
Neutro Abs: 11.4 10*3/uL — ABNORMAL HIGH (ref 1.7–7.7)
Neutrophils Relative %: 90 %
PLATELETS: 225 10*3/uL (ref 150–400)
RBC: 6.38 MIL/uL — AB (ref 3.87–5.11)
RDW: 14.1 % (ref 11.5–15.5)
WBC: 12.7 10*3/uL — ABNORMAL HIGH (ref 4.0–10.5)

## 2016-10-28 LAB — I-STAT VENOUS BLOOD GAS, ED
Acid-base deficit: 6 mmol/L — ABNORMAL HIGH (ref 0.0–2.0)
BICARBONATE: 20.3 mmol/L (ref 20.0–28.0)
O2 Saturation: 78 %
PH VEN: 7.297 (ref 7.250–7.430)
TCO2: 22 mmol/L (ref 0–100)
pCO2, Ven: 41.5 mmHg — ABNORMAL LOW (ref 44.0–60.0)
pO2, Ven: 47 mmHg — ABNORMAL HIGH (ref 32.0–45.0)

## 2016-10-28 LAB — HEPATIC FUNCTION PANEL
ALBUMIN: 4 g/dL (ref 3.5–5.0)
ALT: 24 U/L (ref 14–54)
AST: 30 U/L (ref 15–41)
Alkaline Phosphatase: 82 U/L (ref 38–126)
Bilirubin, Direct: 0.1 mg/dL (ref 0.1–0.5)
Indirect Bilirubin: 1 mg/dL — ABNORMAL HIGH (ref 0.3–0.9)
Total Bilirubin: 1.1 mg/dL (ref 0.3–1.2)
Total Protein: 8.1 g/dL (ref 6.5–8.1)

## 2016-10-28 LAB — I-STAT CHEM 8, ED
BUN: 15 mg/dL (ref 6–20)
Calcium, Ion: 1.17 mmol/L (ref 1.15–1.40)
Chloride: 104 mmol/L (ref 101–111)
Creatinine, Ser: 0.5 mg/dL (ref 0.44–1.00)
GLUCOSE: 521 mg/dL — AB (ref 65–99)
HCT: 45 % (ref 36.0–46.0)
HEMOGLOBIN: 15.3 g/dL — AB (ref 12.0–15.0)
POTASSIUM: 3.6 mmol/L (ref 3.5–5.1)
Sodium: 141 mmol/L (ref 135–145)
TCO2: 21 mmol/L (ref 0–100)

## 2016-10-28 LAB — LIPASE, BLOOD: LIPASE: 20 U/L (ref 11–51)

## 2016-10-28 LAB — URINE MICROSCOPIC-ADD ON

## 2016-10-28 LAB — CBC
HCT: 40.1 % (ref 36.0–46.0)
HEMOGLOBIN: 13.1 g/dL (ref 12.0–15.0)
MCH: 21.7 pg — ABNORMAL LOW (ref 26.0–34.0)
MCHC: 32.7 g/dL (ref 30.0–36.0)
MCV: 66.5 fL — ABNORMAL LOW (ref 78.0–100.0)
Platelets: 216 10*3/uL (ref 150–400)
RBC: 6.03 MIL/uL — AB (ref 3.87–5.11)
RDW: 14.4 % (ref 11.5–15.5)
WBC: 11.6 10*3/uL — ABNORMAL HIGH (ref 4.0–10.5)

## 2016-10-28 LAB — CBG MONITORING, ED
GLUCOSE-CAPILLARY: 521 mg/dL — AB (ref 65–99)
GLUCOSE-CAPILLARY: 324 mg/dL — AB (ref 65–99)
GLUCOSE-CAPILLARY: 234 mg/dL — AB (ref 65–99)
Glucose-Capillary: 330 mg/dL — ABNORMAL HIGH (ref 65–99)
Glucose-Capillary: 356 mg/dL — ABNORMAL HIGH (ref 65–99)

## 2016-10-28 LAB — I-STAT TROPONIN, ED: Troponin i, poc: 0.27 ng/mL (ref 0.00–0.08)

## 2016-10-28 LAB — BETA-HYDROXYBUTYRIC ACID: BETA-HYDROXYBUTYRIC ACID: 3.43 mmol/L — AB (ref 0.05–0.27)

## 2016-10-28 LAB — D-DIMER, QUANTITATIVE: D-Dimer, Quant: 7.54 ug/mL-FEU — ABNORMAL HIGH (ref 0.00–0.50)

## 2016-10-28 MED ORDER — SODIUM CHLORIDE 0.9 % IV SOLN
INTRAVENOUS | Status: DC
  Administered 2016-10-28: 20:00:00 via INTRAVENOUS

## 2016-10-28 MED ORDER — SODIUM CHLORIDE 0.9 % IV SOLN
30.0000 meq | Freq: Once | INTRAVENOUS | Status: AC
  Administered 2016-10-28 (×2): 30 meq via INTRAVENOUS
  Filled 2016-10-28: qty 15

## 2016-10-28 MED ORDER — INSULIN REGULAR HUMAN 100 UNIT/ML IJ SOLN
INTRAMUSCULAR | Status: DC
  Administered 2016-10-28: 3 [IU]/h via INTRAVENOUS
  Filled 2016-10-28: qty 2.5

## 2016-10-28 MED ORDER — LORAZEPAM 2 MG/ML IJ SOLN
0.5000 mg | Freq: Once | INTRAMUSCULAR | Status: AC
  Administered 2016-10-28: 0.5 mg via INTRAVENOUS
  Filled 2016-10-28: qty 1

## 2016-10-28 MED ORDER — SODIUM CHLORIDE 0.9 % IV BOLUS (SEPSIS)
1000.0000 mL | Freq: Once | INTRAVENOUS | Status: AC
  Administered 2016-10-28: 1000 mL via INTRAVENOUS

## 2016-10-28 MED ORDER — LORAZEPAM 0.5 MG PO TABS
0.5000 mg | ORAL_TABLET | Freq: Four times a day (QID) | ORAL | Status: DC | PRN
  Administered 2016-10-29 (×2): 0.5 mg via ORAL
  Filled 2016-10-28 (×2): qty 1

## 2016-10-28 MED ORDER — IOPAMIDOL (ISOVUE-370) INJECTION 76%
INTRAVENOUS | Status: AC
  Administered 2016-10-28: 100 mL
  Filled 2016-10-28: qty 100

## 2016-10-28 NOTE — ED Notes (Signed)
Family states the patient has been hiding snack cakes and candy and has been eating them secretly.

## 2016-10-28 NOTE — ED Provider Notes (Signed)
[facility] DEPT Provider Note   CSN: [id number] Arrival date & time: 10/28/16  1640     History   Chief Complaint Chief Complaint  Patient presents with  . Hyperglycemia  . Altered Mental Status    HPI Kristen Lozano is a 60 y.o. female.  HPI Yesterday family thought she was a little confused but this morning she seemed fine again.  When they went to check on her later in the day she was complaining of nausea and vomiting.  She vomited a number of times today.  She is listless and slow to respond.  Pt has not been compliant with her diet.  She has been eating a lot of sweets lately.  She is having diffuse abdominal pain. She has had some diarrhea.  No chest pain or shortness of breath.  Past Medical History:  Diagnosis Date  . Allergic rhinitis    Requires cetirizine, singulair, and fluticasone.  . Anxiety    Has been on Xanax since 2009. Uses it for stress, anxiety, and insomnia. No contract yet.  . Arthritis   . Chronic pain    Has OA of knees B. No Xrays in echart. Not requiring narcotics.  . Depression   . Diabetes mellitus    Type 2, non insulin dependent. Was dx'd prior to 2008.  Marland Kitchen Hyperlipemia   . Hypertension   . Sickle cell trait ([place])   . Stroke 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 Cognitive deficit due to recent stroke 10/18/2014  . 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 Abnormal stress test 10/17/2014  . Dysphagia, pharyngoesophageal phase 09/23/2014  . Abnormal CT scan 09/23/2014  . Dilated bile duct 09/23/2014  . Urinary retention 09/07/2014  . 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 Stroke ([place]) 09/05/2014  . CVA (cerebral infarction) 09/05/2014  . Non compliance w medication regimen 08/10/2012  . Fatigue 02/18/2012  . Diabetes mellitus ([place]) 03/22/2011  . HTN (hypertension) 03/22/2011  . Healthcare  maintenance 03/22/2011  . Chronic pain   . ALLERGIC RHINITIS, SEASONAL 03/14/2008  . SICKLE CELL TRAIT 10/31/2006  . Anxiety state 10/31/2006  . DEPRESSION 10/31/2006    Past Surgical History:  Procedure Laterality Date  . TEE WITHOUT CARDIOVERSION N/A 09/06/2014   Procedure: TRANSESOPHAGEAL ECHOCARDIOGRAM (TEE);  Surgeon: Pixie Casino, MD;  Location: [facility] ENDOSCOPY;  Service: Cardiovascular;  Laterality: N/A;    OB History    No data available       Home Medications    Prior to Admission medications   Medication Sig Start Date End Date Taking? Authorizing Provider  carvedilol (COREG) 12.5 MG tablet Take 1 tablet (12.5 mg total) by mouth 2 (two) times daily with a meal. 11/06/15  Yes Pixie Casino, MD  ALPRAZolam (XANAX XR) 2 MG 24 hr tablet Take 2 mg by mouth daily.    Historical Provider, MD  aspirin 325 MG tablet Take 1 tablet (325 mg total) by mouth daily. 09/10/14   Belkys A Regalado, MD  atorvastatin (LIPITOR) 20 MG tablet Take 1 tablet (20 mg total) by mouth daily at 6 PM. PATIENT NEEDS TO CONTACT OFFICE FOR ADDITIONAL REFILLS 11/09/15   Pixie Casino, MD  feeding supplement, GLUCERNA SHAKE, (GLUCERNA SHAKE) LIQD Take 237 mLs by mouth 3 (three) times daily with meals. 09/10/14   Belkys A Regalado, MD  lisinopril (PRINIVIL,ZESTRIL)  10 MG tablet Take 1 tablet (10 mg total) by mouth daily. 11/01/14   Pixie Casino, MD  metFORMIN (GLUCOPHAGE) 1000 MG tablet Take 1 tablet (1,000 mg total) by mouth 2 (two) times daily with a meal. 09/14/15   Pixie Casino, MD  metoCLOPramide (REGLAN) 10 MG tablet Take 10 mg by mouth 4 (four) times daily -  before meals and at bedtime.    Historical Provider, MD  oxyCODONE-acetaminophen (PERCOCET) 10-325 MG per tablet Take 1 tablet by mouth 3 (three) times daily as needed. For pain 08/08/14   Historical Provider, MD  polyethylene glycol Merril Abbe / GLYCOLAX) packet  09/14/14   Historical Provider, MD    Family History Family History    Problem Relation Age of Onset  . Diabetes Mother   . Hypertension Mother   . Heart disease Father   . Heart attack Father   . Hypertension Father   . Diabetes Father   . Ovarian cancer Sister   . Liver cancer Sister   . Sickle cell anemia Daughter   . Schizophrenia Daughter   . Hypertension Sister   . Hypertension Brother   . Hypertension Daughter   . Kidney disease Sister     x2  . Kidney disease Brother   . Stroke Neg Hx   . Esophageal cancer Neg Hx   . Colon cancer Neg Hx   . Colon polyps Neg Hx     Social History Social History  Substance Use Topics  . Smoking status: Never Smoker  . Smokeless tobacco: Never Used  . Alcohol use No     Allergies   Patient has no known allergies.   Review of Systems Review of Systems  All other systems reviewed and are negative.    Physical Exam Updated Vital Signs BP 144/80   Pulse 120   Temp 98 F (36.7 C) (Oral)   Resp 19   SpO2 97%   Physical Exam  Constitutional: She appears listless. No distress.  HENT:  Head: Normocephalic and atraumatic.  Right Ear: External ear normal.  Left Ear: External ear normal.  Mouth/Throat: No oropharyngeal exudate.  Mucous membranes are dry  Eyes: Conjunctivae are normal. Right eye exhibits no discharge. Left eye exhibits no discharge. No scleral icterus.  Neck: Neck supple. No tracheal deviation present.  Cardiovascular: Regular rhythm and intact distal pulses.  Tachycardia present.   Pulmonary/Chest: Effort normal and breath sounds normal. No stridor. No respiratory distress. She has no wheezes. She has no rales.  Abdominal: Soft. Bowel sounds are normal. She exhibits no distension. There is no tenderness. There is no rebound and no guarding.  Musculoskeletal: She exhibits no edema or tenderness.  Neurological: She has normal strength. She appears listless. No cranial nerve deficit (no facial droop, extraocular movements intact, no slurred speech) or sensory deficit. She  exhibits normal muscle tone. She displays no seizure activity. Coordination normal.  Skin: Skin is warm and dry. No rash noted.  Psychiatric: She has a normal mood and affect.  Nursing note and vitals reviewed.    ED Treatments / Results  Labs (all labs ordered are listed, but only abnormal results are displayed) Labs Reviewed  BASIC METABOLIC PANEL - Abnormal; Notable for the following:       Result Value   CO2 18 (*)    Glucose, Bld 530 (*)    All other components within normal limits  CBC - Abnormal; Notable for the following:    WBC 11.6 (*)  RBC 6.03 (*)    MCV 66.5 (*)    MCH 21.7 (*)    All other components within normal limits  URINALYSIS, ROUTINE W REFLEX MICROSCOPIC (NOT AT [facility]) - Abnormal; Notable for the following:    Glucose, UA >1000 (*)    Ketones, ur 40 (*)    All other components within normal limits  CBC WITH DIFFERENTIAL/PLATELET - Abnormal; Notable for the following:    WBC 12.7 (*)    RBC 6.38 (*)    MCV 66.1 (*)    MCH 22.3 (*)    Neutro Abs 11.4 (*)    All other components within normal limits  HEPATIC FUNCTION PANEL - Abnormal; Notable for the following:    Indirect Bilirubin 1.0 (*)    All other components within normal limits  URINE MICROSCOPIC-ADD ON - Abnormal; Notable for the following:    Squamous Epithelial / LPF 6-30 (*)    Bacteria, UA RARE (*)    All other components within normal limits  CBG MONITORING, ED - Abnormal; Notable for the following:    Glucose-Capillary 521 (*)    All other components within normal limits  I-STAT CHEM 8, ED - Abnormal; Notable for the following:    Glucose, Bld 521 (*)    Hemoglobin 15.3 (*)    All other components within normal limits  I-STAT VENOUS BLOOD GAS, ED - Abnormal; Notable for the following:    pCO2, Ven 41.5 (*)    pO2, Ven 47.0 (*)    Acid-base deficit 6.0 (*)    All other components within normal limits  I-STAT TROPOININ, ED - Abnormal; Notable for the following:    Troponin i, poc  0.27 (*)    All other components within normal limits  CBG MONITORING, ED - Abnormal; Notable for the following:    Glucose-Capillary 330 (*)    All other components within normal limits  LIPASE, BLOOD  BETA-HYDROXYBUTYRIC ACID  D-DIMER, QUANTITATIVE (NOT AT [facility])    EKG  EKG Interpretation  Date/Time:  Monday October 28 2016 16:45:57 EST Ventricular Rate:  120 PR Interval:    QRS Duration: 92 QT Interval:  392 QTC Calculation: 554 R Axis:   25 Text Interpretation:  Sinus tachycardia Consider right atrial enlargement Probable LVH with secondary repol abnrm , new since last tracing Abnormal T, probable ischemia, anterior leads , new since last tracing Prolonged QT interval , new since last tracing Confirmed by Niko Jakel  MD-J, Ari Engelbrecht ([id number]) on 10/28/2016 5:14:06 PM       Radiology Dg Abdomen Acute W/chest  Result Date: 10/28/2016 CLINICAL DATA:  Abdominal pain, nausea and vomiting. EXAM: DG ABDOMEN ACUTE W/ 1V CHEST COMPARISON:  09/04/2014 and prior exams FINDINGS: The cardiomediastinal silhouette is unremarkable. Interstitial prominence noted. There is no evidence of airspace disease, pleural effusion, mass or pneumothorax. The bowel gas pattern is unremarkable. No dilated bowel loops are identified. There is no evidence of bowel obstruction or pneumoperitoneum. No suspicious calcifications are present. No acute bony abnormalities are identified. IMPRESSION: Unremarkable bowel gas pattern. No evidence of bowel obstruction or pneumoperitoneum. Mild interstitial prominence within both lungs -nonspecific. Electronically Signed   By: Margarette Canada M.D.   On: 10/28/2016 18:53    Procedures Procedures (including critical care time)  Medications Ordered in ED Medications  sodium chloride 0.9 % bolus 1,000 mL (1,000 mLs Intravenous New Bag/Given 10/28/16 1742)    And  sodium chloride 0.9 % bolus 1,000 mL (1,000 mLs Intravenous New Bag/Given 10/28/16 1742)  And  0.9 %  sodium chloride infusion  (not administered)  insulin regular (NOVOLIN R,HUMULIN R) 250 Units in sodium chloride 0.9 % 250 mL (1 Units/mL) infusion (not administered)  potassium chloride 30 mEq in sodium chloride 0.9 % 265 mL (KCL MULTIRUN) IVPB (not administered)     Initial Impression / Assessment and Plan / ED Course  I have reviewed the triage vital signs and the nursing notes.  Pertinent labs & imaging results that were available during my care of the patient were reviewed by me and considered in my medical decision making (see chart for details).  Clinical Course as of Oct 28 1934  Mon Oct 28, 2016  1726 Tachycardia and st changes noted on the monitor.  Pt denies chest pain.   Will give iv fluids, check potassium considering her nausea and vomiting with hyperglycemia.  Monitor closely  [JK]  1757 Hyperglycemia without hyperkalemia.  Will start insulin and potassium infusion  [JK]  1914 Labs reviewed.  Pt does not have a metabolic acidosis.  Consistent with hyperglycemia and mild dehydration.  Pt does have an elevated troponin with tachycardia.   Denies chest pain or shortness of breath.  Will need to follow her troponin.  Add on a d dimer. Considering her persistent tachycardia and hyperglycemia I will consult for admission.  [JK]  1918 Repeat abdominal exam.  No ttp.  [JK]    Clinical Course User Index [JK] Dorie Rank, MD    Final Clinical Impressions(s) / ED Diagnoses   Final diagnoses:  Hyperglycemia  Tachycardia  Dehydration  Elevated troponin    New Prescriptions New Prescriptions   No medications on file     Dorie Rank, MD 10/28/16 1935
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
## 2016-10-28 NOTE — ED Notes (Signed)
Patient transported to CT 

## 2016-10-28 NOTE — ED Notes (Signed)
Pt back from CT, CT tech stated that pt was violently ill and was unable to lay flat for scan. Spoke w/ admitting provider who stated he couldn't give her any Zofran due to the prolonged QT, stated to give her some Ativan and see if that would help.

## 2016-10-28 NOTE — H&P (Signed)
History and Physical  Patient Name: Kristen Lozano     LYY:[id number]    DOB: 1955/12/04    DOA: 10/28/2016 PCP: Vonna Drafts, FNP   Patient coming from: Home  Chief Complaint: Confusion, vomiting  HPI: Kristen Lozano is a 60 y.o. female with a past medical history significant for NIDDM, HTN, isch CM with EF 35%, and hx of CVA who presents with 2 days confusion and vomiting.  The patient was in her usual state of health (disabled, but independent, uses a cane "sometimes", lives with husband, takes the bus to the store alone regularly) until yesterday Sunday.  Husband noticed that she was confused, talking about "getting something fixed" and he couldn't understand what she was talking about, but made nothing of it and there were no other accompanying symptoms (fever, cough, urinary symptoms, LOC, focal weakness, slurred speech).  This morning, she seemed back to her normal self, but around mid-day she was vomiting and complained of "stomach ache."  He went to work, but she called him from home saying "I can't stop vomiting" and "Something is wrong", so EMS were called.    EMS found her altered, glucose >600, vomiting, cool to touch and with "ischemic" appearing ECG.  ED course: -Afebrile, heart rate 120s, respirations and pulse ox normal, BP 153/94 -Na 139, K 3.9, Cr 0.93 (baseline 0.5), WBC 12.7K, Hgb 13.1, LFTs normal -UA with ketones, glucose, no pyuria or hematuria -Troponin 0.21 -CXR without focal opacity -ECG sinus tachycardia with diffuse TWI, new from last in 2015 -She was started on fluids and insulin and TRH were asked to evaluate for hyperglycemia without DKA  The patient has no PCP, has not seen a physician since her last hospitalization in 2015 (saw Neuro a few times after that, Cards a few times).  Husband states she takes metformin, but her bottle is labeled Dr. Debara Pickett and appears very old.  Her stroke was believed to be embolic, but family declined warfarin.  Her echo  showed a previously unknown EF 35%, but follow up catheterization was declined by family.          ROS: Review of Systems  Constitutional: Negative for chills and fever.  Respiratory: Negative for cough and shortness of breath.   Cardiovascular: Negative for chest pain.  Gastrointestinal: Positive for abdominal pain, nausea and vomiting.  Genitourinary: Negative for dysuria, frequency, hematuria and urgency.  Neurological: Negative for dizziness, sensory change, speech change, focal weakness, seizures, loss of consciousness and headaches.       Positive for confusion   All other systems reviewed and are negative.         Past Medical History:  Diagnosis Date  . Allergic rhinitis    Requires cetirizine, singulair, and fluticasone.  . Anxiety    Has been on Xanax since 2009. Uses it for stress, anxiety, and insomnia. No contract yet.  . Arthritis   . Chronic pain    Has OA of knees B. No Xrays in echart. Not requiring narcotics.  . Depression   . Diabetes mellitus    Type 2, non insulin dependent. Was dx'd prior to 2008.  Marland Kitchen Hyperlipemia   . Hypertension   . Sickle cell trait ([place])   . 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 TEE WITHOUT CARDIOVERSION N/A 09/06/2014   Procedure: TRANSESOPHAGEAL ECHOCARDIOGRAM (TEE);  Surgeon: Pixie Casino, MD;  Location: [facility] ENDOSCOPY;  Service: Cardiovascular;  Laterality: N/A;    Social History: Patient lives with her husband.  The patient walks with a cane "sometimes".  Nonsmoker.  Disabled, but independent, uses a cane "sometimes", lives with husband, takes the bus to the store alone regularly.  No Known Allergies  Family history: family history includes Diabetes in her father and mother; Heart attack in her father; Heart disease in her father; Hypertension in her brother, daughter, father, mother, and sister; Kidney disease  in her brother and sister; Liver cancer in her sister; Ovarian cancer in her sister; Schizophrenia in her daughter; Sickle cell anemia in her daughter.  Prior to Admission medications   Medication Sig Start Date End Date Taking? Authorizing Provider  carvedilol (COREG) 12.5 MG tablet Take 1 tablet (12.5 mg total) by mouth 2 (two) times daily with a meal. 11/06/15  Yes Pixie Casino, MD  ALPRAZolam (XANAX XR) 2 MG 24 hr tablet Take 2 mg by mouth daily.    Historical Provider, MD  aspirin 325 MG tablet Take 1 tablet (325 mg total) by mouth daily. 09/10/14   Belkys A Regalado, MD  atorvastatin (LIPITOR) 20 MG tablet Take 1 tablet (20 mg total) by mouth daily at 6 PM. PATIENT NEEDS TO CONTACT OFFICE FOR ADDITIONAL REFILLS 11/09/15   Pixie Casino, MD  feeding supplement, GLUCERNA SHAKE, (GLUCERNA SHAKE) LIQD Take 237 mLs by mouth 3 (three) times daily with meals. 09/10/14   Belkys A Regalado, MD  lisinopril (PRINIVIL,ZESTRIL) 10 MG tablet Take 1 tablet (10 mg total) by mouth daily. 11/01/14   Pixie Casino, MD  metFORMIN (GLUCOPHAGE) 1000 MG tablet Take 1 tablet (1,000 mg total) by mouth 2 (two) times daily with a meal. 09/14/15   Pixie Casino, MD  metoCLOPramide (REGLAN) 10 MG tablet Take 10 mg by mouth 4 (four) times daily -  before meals and at bedtime.    Historical Provider, MD  oxyCODONE-acetaminophen (PERCOCET) 10-325 MG per tablet Take 1 tablet by mouth 3 (three) times daily as needed. For pain 08/08/14   Historical Provider, MD  polyethylene glycol (MIRALAX / GLYCOLAX) packet  09/14/14   Historical Provider, MD       Physical Exam: BP 144/80   Pulse 120   Temp 98 F (36.7 C) (Oral)   Resp 19   SpO2 97%  General appearance: Thin adult female, awake and responsive but responses slowed.   Eyes: Anicteric, conjunctiva pink, lids and lashes normal. PERRL.    ENT: No nasal deformity, discharge, epistaxis.  Hearing normal. OP tacky dry without lesions.   Neck: No neck masses.   Trachea midline.  No thyromegaly/tenderness. Lymph: No cervical or supraclavicular lymphadenopathy. Skin: Warm and dry.  No jaundice.  No suspicious rashes or lesions. Cardiac: Tachycardic, regular, nl S1-S2, no murmurs appreciated.  Capillary refill is brisk.  JVP normal.  No LE edema.  Radial and DP pulses 2+ and symmetric. Respiratory: Normal respiratory rate and rhythm.  CTAB without rales or wheezes. Abdomen: Abdomen soft.  No TTP or guarding at all. No ascites, distension, hepatosplenomegaly.   MSK: No deformities or effusions.  No cyanosis or clubbing.  Diffuse lack of muscle mass or subcutaneous fat. Neuro: Cranial nerves grossly symmetric.  Patient slow to follow commands.  Sensation intact to light touch. Speech is fluent.  Muscle strength globally weak but symmetric.   States she is "at home", year is "55".  Redirectable. Psych: Sensorium intact and responding to questions, attention decreased.  Behavior confused.  Judgment and insight appear diminished.     Labs on Admission:  I have personally reviewed following labs and imaging studies: CBC:  Recent Labs Lab 10/28/16 1647 10/28/16 1724 10/28/16 1750  WBC 11.6* 12.7*  --   NEUTROABS  --  11.4*  --   HGB 13.1 14.2 15.3*  HCT 40.1 42.2 45.0  MCV 66.5* 66.1*  --   PLT 216 225  --    Basic Metabolic Panel:  Recent Labs Lab 10/28/16 1647 10/28/16 1750  NA 139 141  K 3.9 3.6  CL 107 104  CO2 18*  --   GLUCOSE 530* 521*  BUN 11 15  CREATININE 0.93 0.50  CALCIUM 9.0  --    GFR: CrCl cannot be calculated (Unknown ideal weight.).  Liver Function Tests:  Recent Labs Lab 10/28/16 1724  AST 30  ALT 24  ALKPHOS 82  BILITOT 1.1  PROT 8.1  ALBUMIN 4.0    Recent Labs Lab 10/28/16 1724  LIPASE 20   No results for input(s): AMMONIA in the last 168 hours. Coagulation Profile: No results for input(s): INR, PROTIME in the last 168 hours. Cardiac Enzymes: No results for input(s): CKTOTAL, CKMB, CKMBINDEX,  TROPONINI in the last 168 hours. BNP (last 3 results) No results for input(s): PROBNP in the last 8760 hours. HbA1C: No results for input(s): HGBA1C in the last 72 hours. CBG:  Recent Labs Lab 10/28/16 1648 10/28/16 1902  GLUCAP 521* 330*   Lipid Profile: No results for input(s): CHOL, HDL, LDLCALC, TRIG, CHOLHDL, LDLDIRECT in the last 72 hours. Thyroid Function Tests: No results for input(s): TSH, T4TOTAL, FREET4, T3FREE, THYROIDAB in the last 72 hours. Anemia Panel: No results for input(s): VITAMINB12, FOLATE, FERRITIN, TIBC, IRON, RETICCTPCT in the last 72 hours. Sepsis Labs: Invalid input(s): PROCALCITONIN, LACTICIDVEN No results found for this or any previous visit (from the past 240 hour(s)).       Radiological Exams on Admission: Personally reviewed CXR showed interstitial markings but no focal opacity.  AXR showed no distended small bowel, no air fluid levels: Dg Abdomen Acute W/chest  Result Date: 10/28/2016 CLINICAL DATA:  Abdominal pain, nausea and vomiting. EXAM: DG ABDOMEN ACUTE W/ 1V CHEST COMPARISON:  09/04/2014 and prior exams FINDINGS: The cardiomediastinal silhouette is unremarkable. Interstitial prominence noted. There is no evidence of airspace disease, pleural effusion, mass or pneumothorax. The bowel gas pattern is unremarkable. No dilated bowel loops are identified. There is no evidence of bowel obstruction or pneumoperitoneum. No suspicious calcifications are present. No acute bony abnormalities are identified. IMPRESSION: Unremarkable bowel gas pattern. No evidence of bowel obstruction or pneumoperitoneum. Mild interstitial prominence within both lungs -nonspecific. Electronically Signed   By: Margarette Canada M.D.   On: 10/28/2016 18:53    EKG: Independently reviewed. Rate 120, QTc 554, diffuse TWI, primarily inferolateral leads, new from 2015.    Echocardiogram 2015: LVEF 35-40%, severe global hypokinesis, normal wall thickness and chamber sizes, mild MR,  diastolic dysfunction with normal LV filling pressure.        Assessment/Plan  1. DKA in T2DM:  Suspect mild DKA, with minimally elevated AG, urine ketones, bicarb initially 18.  Inciting factor either excess glucose intake + nonadherence to metformin.  Will rulte out PE per EDP.  Doubt ACS as inciting factor given absence of chest pain, but will trend troponin.  No clinical signs of infection. -NPO for now -Insulin gtt -Fluids per protocol -Close monitoring of electrolytes overnight with BMP q4hrs -Check mag and  phos -Monitor on telemetry -Monitor dimer and CTA if elevated -Trend troponin -Culture for fever  -Consult SW for glucometer and PCP referral   2. Elevated troponin:  Suspect demand ischemia in setting of #1 and reduced EF. -Trend troponin -Consult Cardiology, appreciate cares  3. Ischemic CM:  No history of CHF.  EF by echo 35% in 2015. -Continue carvedilol  4. HTN:  -Continue carvedilol -Restart lisinopril if needed for BP control  5. Prolonged QT:  -Avoid QT prolonging agents -Check mag  6. Anxiety:  Medication list includes alprazolam.  Family unsure what she takes. -Lorazepam for anxiety or nausea        DVT prophylaxis: Lovenox  Code Status: FULL  Family Communication: Husband at bedside  Disposition Plan: Anticipate IV fluids and insulin and close monitoring of electrolytes.  Consult to Cardiology tomorrow. Consults called: Cardiology, via Inbasket Admission status: INPATIENT    Medical decision making: Patient seen at 7:45 PM on 10/28/2016.  The patient was discussed with Dr. Tomi Bamberger.  What exists of the patient's chart was reviewed in depth and summarized above.  Clinical condition: requiring close monitoring of electrolytes.        Edwin Dada [facility] Pager [phone]      At the time of admission, it appears that the appropriate admission status for this patient is INPATIENT. This is judged to be  reasonable and necessary in order to provide the required intensity of service to ensure the patient's safety given the presenting symptoms, physical exam findings, and initial radiographic and laboratory data in the context of their chronic comorbidities.  Together, these circumstances are felt to place her/him at high risk for further clinical deterioration threatening life, limb, or organ. The following factors support the admission status of inpatient:   A. The patient's presenting symptoms include nausea, confusion, altered mental status, vomiting B. The worrisome physical exam findings include tachycardia, weakness, confusion C. The initial radiographic and laboratory data are worrisome because of hyperglycemia, acidosis, leukocytosis, elevated troponiin, abnormal ECG D. The chronic co-morbidities include chronic systolic congestive heart failure with EF 35%, non-insulin dependent diabetes, hypertension E. Patient requires inpatient status due to high intensity of service, high risk for further deterioration and high frequency of surveillance required because of this acute illness that poses a threat to life or bodily function. F. I certify that at the point of admission it is my clinical judgment that the patient will require inpatient hospital care spanning beyond 2 midnights from the point of admission and that early discharge would result in unnecessary risk of decompensation and readmission or threat to life, limb or bodily function.

## 2016-10-28 NOTE — Progress Notes (Signed)
Report was given to RN on 3S. ED RN stated they were going to try to take patient to get CTA done before bringing patient to [facility], if patient could tolerate.

## 2016-10-28 NOTE — ED Notes (Signed)
Went to check cbg and begin insulin drip and potassium. PT remains in imaging.

## 2016-10-28 NOTE — ED Notes (Signed)
Per patient and family patient was confused yesterday and kept repeating things.  Patient takes metformin for diabetes.  Family states that they do not have a glucose meter and would like to get one.  Patient is stable at this time.  Will continue to monitor this patient.

## 2016-10-28 NOTE — ED Notes (Signed)
Spoke w/ pt placement and they stated that due to pt being on an insulin drip, she was not appropriate for Tele. They stated the bed order needs to be changed to Step down. Had admitting provider paged, waiting on call back.

## 2016-10-29 ENCOUNTER — Encounter (HOSPITAL_COMMUNITY): Payer: Self-pay | Admitting: General Practice

## 2016-10-29 DIAGNOSIS — I639 Cerebral infarction, unspecified: Secondary | ICD-10-CM

## 2016-10-29 DIAGNOSIS — R748 Abnormal levels of other serum enzymes: Secondary | ICD-10-CM

## 2016-10-29 DIAGNOSIS — R9431 Abnormal electrocardiogram [ECG] [EKG]: Secondary | ICD-10-CM

## 2016-10-29 DIAGNOSIS — E876 Hypokalemia: Secondary | ICD-10-CM

## 2016-10-29 DIAGNOSIS — R4701 Aphasia: Secondary | ICD-10-CM

## 2016-10-29 DIAGNOSIS — E1310 Other specified diabetes mellitus with ketoacidosis without coma: Secondary | ICD-10-CM

## 2016-10-29 DIAGNOSIS — I255 Ischemic cardiomyopathy: Secondary | ICD-10-CM

## 2016-10-29 DIAGNOSIS — I10 Essential (primary) hypertension: Secondary | ICD-10-CM

## 2016-10-29 DIAGNOSIS — E1100 Type 2 diabetes mellitus with hyperosmolarity without nonketotic hyperglycemic-hyperosmolar coma (NKHHC): Secondary | ICD-10-CM

## 2016-10-29 LAB — BASIC METABOLIC PANEL
ANION GAP: 6 (ref 5–15)
ANION GAP: 8 (ref 5–15)
ANION GAP: 8 (ref 5–15)
Anion gap: 6 (ref 5–15)
BUN: <5 mg/dL — ABNORMAL LOW (ref 6–20)
BUN: 5 mg/dL — ABNORMAL LOW (ref 6–20)
BUN: <5 mg/dL — ABNORMAL LOW (ref 6–20)
CALCIUM: 8.1 mg/dL — AB (ref 8.9–10.3)
CALCIUM: 8.1 mg/dL — AB (ref 8.9–10.3)
CALCIUM: 8.2 mg/dL — AB (ref 8.9–10.3)
CHLORIDE: 109 mmol/L (ref 101–111)
CO2: 19 mmol/L — ABNORMAL LOW (ref 22–32)
CO2: 20 mmol/L — AB (ref 22–32)
CO2: 21 mmol/L — AB (ref 22–32)
CO2: 22 mmol/L (ref 22–32)
CREATININE: 0.53 mg/dL (ref 0.44–1.00)
CREATININE: 0.56 mg/dL (ref 0.44–1.00)
CREATININE: 0.66 mg/dL (ref 0.44–1.00)
Calcium: 7.9 mg/dL — ABNORMAL LOW (ref 8.9–10.3)
Chloride: 115 mmol/L — ABNORMAL HIGH (ref 101–111)
Chloride: 115 mmol/L — ABNORMAL HIGH (ref 101–111)
Chloride: 113 mmol/L — ABNORMAL HIGH (ref 101–111)
Creatinine, Ser: 0.55 mg/dL (ref 0.44–1.00)
GFR calc Af Amer: >60 mL/min (ref >60)
GFR calc non Af Amer: >60 mL/min (ref >60)
GLUCOSE: 128 mg/dL — AB (ref 65–99)
GLUCOSE: 276 mg/dL — AB (ref 65–99)
Glucose, Bld: 218 mg/dL — ABNORMAL HIGH (ref 65–99)
Glucose, Bld: 195 mg/dL — ABNORMAL HIGH (ref 65–99)
POTASSIUM: 2.9 mmol/L — AB (ref 3.5–5.1)
Potassium: 3.3 mmol/L — ABNORMAL LOW (ref 3.5–5.1)
Potassium: 3.1 mmol/L — ABNORMAL LOW (ref 3.5–5.1)
Potassium: 2.7 mmol/L — CL (ref 3.5–5.1)
SODIUM: 136 mmol/L (ref 135–145)
SODIUM: 142 mmol/L (ref 135–145)
Sodium: 143 mmol/L (ref 135–145)
Sodium: 141 mmol/L (ref 135–145)

## 2016-10-29 LAB — GLUCOSE, CAPILLARY
GLUCOSE-CAPILLARY: 125 mg/dL — AB (ref 65–99)
GLUCOSE-CAPILLARY: 148 mg/dL — AB (ref 65–99)
GLUCOSE-CAPILLARY: 149 mg/dL — AB (ref 65–99)
GLUCOSE-CAPILLARY: 182 mg/dL — AB (ref 65–99)
GLUCOSE-CAPILLARY: 185 mg/dL — AB (ref 65–99)
GLUCOSE-CAPILLARY: 206 mg/dL — AB (ref 65–99)
GLUCOSE-CAPILLARY: 224 mg/dL — AB (ref 65–99)
GLUCOSE-CAPILLARY: 258 mg/dL — AB (ref 65–99)
GLUCOSE-CAPILLARY: 270 mg/dL — AB (ref 65–99)
GLUCOSE-CAPILLARY: 274 mg/dL — AB (ref 65–99)
Glucose-Capillary: 210 mg/dL — ABNORMAL HIGH (ref 65–99)
Glucose-Capillary: 134 mg/dL — ABNORMAL HIGH (ref 65–99)
Glucose-Capillary: 129 mg/dL — ABNORMAL HIGH (ref 65–99)

## 2016-10-29 LAB — PHOSPHORUS: Phosphorus: 2.1 mg/dL — ABNORMAL LOW (ref 2.5–4.6)

## 2016-10-29 LAB — MRSA PCR SCREENING: MRSA BY PCR: NEGATIVE

## 2016-10-29 LAB — TROPONIN I
TROPONIN I: 0.18 ng/mL — AB (ref <0.03)
Troponin I: 0.2 ng/mL (ref <0.03)

## 2016-10-29 LAB — MAGNESIUM: Magnesium: 1.6 mg/dL — ABNORMAL LOW (ref 1.7–2.4)

## 2016-10-29 MED ORDER — DEXTROSE-NACL 5-0.45 % IV SOLN
INTRAVENOUS | Status: DC
  Administered 2016-10-29 (×2): via INTRAVENOUS

## 2016-10-29 MED ORDER — ACETAMINOPHEN 650 MG RE SUPP: 650.0000 mg | Freq: Four times a day (QID) | RECTAL | Status: DC | PRN

## 2016-10-29 MED ORDER — CARVEDILOL 12.5 MG PO TABS
12.5000 mg | ORAL_TABLET | Freq: Two times a day (BID) | ORAL | Status: DC
  Administered 2016-10-29: 12.5 mg via ORAL
  Filled 2016-10-29: qty 1

## 2016-10-29 MED ORDER — SODIUM CHLORIDE 0.9% FLUSH
3.0000 mL | Freq: Two times a day (BID) | INTRAVENOUS | Status: DC
  Administered 2016-10-29 – 2016-10-31 (×5): 3 mL via INTRAVENOUS

## 2016-10-29 MED ORDER — METOCLOPRAMIDE HCL 5 MG/ML IJ SOLN
10.0000 mg | Freq: Once | INTRAMUSCULAR | Status: AC
  Administered 2016-10-29: 10 mg via INTRAVENOUS
  Filled 2016-10-29: qty 2

## 2016-10-29 MED ORDER — POTASSIUM CHLORIDE CRYS ER 20 MEQ PO TBCR
40.0000 meq | EXTENDED_RELEASE_TABLET | ORAL | Status: DC
  Filled 2016-10-29: qty 2

## 2016-10-29 MED ORDER — INSULIN GLARGINE 100 UNIT/ML ~~LOC~~ SOLN
10.0000 [IU] | Freq: Once | SUBCUTANEOUS | Status: AC
  Administered 2016-10-29: 10 [IU] via SUBCUTANEOUS
  Filled 2016-10-29: qty 0.1

## 2016-10-29 MED ORDER — LIVING WELL WITH DIABETES BOOK
Freq: Once | Status: AC
  Administered 2016-10-29: 12:00:00
  Filled 2016-10-29: qty 1

## 2016-10-29 MED ORDER — SODIUM CHLORIDE 0.9 % IV SOLN: INTRAVENOUS | Status: DC

## 2016-10-29 MED ORDER — INSULIN ASPART 100 UNIT/ML ~~LOC~~ SOLN
0.0000 [IU] | Freq: Three times a day (TID) | SUBCUTANEOUS | Status: DC
Start: 2016-10-29 — End: 2016-11-01
  Administered 2016-10-29 – 2016-10-30 (×3): 5 [IU] via SUBCUTANEOUS
  Administered 2016-10-30 (×2): 3 [IU] via SUBCUTANEOUS
  Administered 2016-10-31 (×2): 2 [IU] via SUBCUTANEOUS
  Administered 2016-10-31 – 2016-11-01 (×2): 3 [IU] via SUBCUTANEOUS

## 2016-10-29 MED ORDER — MAGNESIUM SULFATE 2 GM/50ML IV SOLN
2.0000 g | Freq: Once | INTRAVENOUS | Status: AC
  Administered 2016-10-29: 2 g via INTRAVENOUS
  Filled 2016-10-29: qty 50

## 2016-10-29 MED ORDER — SODIUM CHLORIDE 0.9 % IV SOLN
Freq: Once | INTRAVENOUS | Status: AC
  Administered 2016-10-29: 10:00:00 via INTRAVENOUS
  Filled 2016-10-29 (×2): qty 1000

## 2016-10-29 MED ORDER — LISINOPRIL 10 MG PO TABS
10.0000 mg | ORAL_TABLET | Freq: Every day | ORAL | Status: DC
  Administered 2016-10-29 – 2016-11-01 (×2): 10 mg via ORAL
  Filled 2016-10-29 (×3): qty 1

## 2016-10-29 MED ORDER — INSULIN ASPART 100 UNIT/ML ~~LOC~~ SOLN
0.0000 [IU] | Freq: Every day | SUBCUTANEOUS | Status: DC
  Administered 2016-10-29: 3 [IU] via SUBCUTANEOUS
  Administered 2016-10-30 – 2016-10-31 (×2): 2 [IU] via SUBCUTANEOUS

## 2016-10-29 MED ORDER — CARVEDILOL 25 MG PO TABS
25.0000 mg | ORAL_TABLET | Freq: Two times a day (BID) | ORAL | Status: DC
  Administered 2016-10-29 – 2016-11-01 (×5): 25 mg via ORAL
  Filled 2016-10-29 (×6): qty 1

## 2016-10-29 MED ORDER — POTASSIUM CHLORIDE 20 MEQ/15ML (10%) PO SOLN
40.0000 meq | ORAL | Status: DC
  Administered 2016-10-29: 40 meq via ORAL
  Filled 2016-10-29 (×2): qty 30

## 2016-10-29 MED ORDER — ATORVASTATIN CALCIUM 20 MG PO TABS
20.0000 mg | ORAL_TABLET | Freq: Every day | ORAL | Status: DC
  Administered 2016-10-29 – 2016-10-31 (×3): 20 mg via ORAL
  Filled 2016-10-29 (×3): qty 1

## 2016-10-29 MED ORDER — ENOXAPARIN SODIUM 30 MG/0.3ML ~~LOC~~ SOLN
30.0000 mg | SUBCUTANEOUS | Status: DC
  Administered 2016-10-29: 30 mg via SUBCUTANEOUS
  Filled 2016-10-29: qty 0.3

## 2016-10-29 MED ORDER — ENOXAPARIN SODIUM 40 MG/0.4ML ~~LOC~~ SOLN
40.0000 mg | SUBCUTANEOUS | Status: DC
  Administered 2016-10-30 – 2016-10-31 (×2): 40 mg via SUBCUTANEOUS
  Filled 2016-10-29 (×2): qty 0.4

## 2016-10-29 MED ORDER — POTASSIUM CHLORIDE CRYS ER 20 MEQ PO TBCR: 80.0000 meq | EXTENDED_RELEASE_TABLET | Freq: Once | ORAL | Status: DC

## 2016-10-29 MED ORDER — DOPAMINE-DEXTROSE 3.2-5 MG/ML-% IV SOLN
INTRAVENOUS | Status: AC
  Filled 2016-10-29: qty 250

## 2016-10-29 MED ORDER — ASPIRIN 325 MG PO TABS
325.0000 mg | ORAL_TABLET | Freq: Every day | ORAL | Status: DC
  Administered 2016-10-29 – 2016-10-31 (×3): 325 mg via ORAL
  Filled 2016-10-29 (×3): qty 1

## 2016-10-29 MED ORDER — ACETAMINOPHEN 325 MG PO TABS: 650.0000 mg | ORAL_TABLET | Freq: Four times a day (QID) | ORAL | Status: DC | PRN

## 2016-10-29 MED ORDER — INSULIN REGULAR HUMAN 100 UNIT/ML IJ SOLN
INTRAMUSCULAR | Status: DC
  Filled 2016-10-29: qty 2.5

## 2016-10-29 MED ORDER — DOPAMINE-DEXTROSE 3.2-5 MG/ML-% IV SOLN
0.0000 ug/kg/min | INTRAVENOUS | Status: DC
  Administered 2016-10-29: 5.027 ug/kg/min via INTRAVENOUS

## 2016-10-29 NOTE — Progress Notes (Addendum)
PROGRESS NOTE    Kristen Lozano  FGH:[id number] DOB: 04-09-1956 DOA: 10/28/2016 PCP: No primary care provider on file.   Chief Complaint  Patient presents with  . Hyperglycemia  . Altered Mental Status    Brief Narrative:  60 y.o. female with a past medical history significant for NIDDM, HTN, isch CM with EF 35%, and hx of CVA who presents with 2 days confusion and vomiting Admitted for DKA  Assessment & Plan   DKA in the setting of type 2 diabetes mellitus -Presented with sugars well over 600. Suspect secondary to noncompliance of medications, lack of follow-up, excess sugar intake. -Placed on glucose stabilizer -Gap is now closed, patient has been transitioned to Lantus and insulin sliding scale -CTA chest was negative for acute pulmonary embolism -UA unremarkable for infection -Hemoglobin A1c pending -Diabetes coronary consulted -Patient has not seen a primary care doctor or any physician for well over a year and a half to 2 years. Her husband, he has been getting her metformin. When questioned about how he is obtaining the medications he states the pharmacy is giving it to him. He does not know the prescribing physician and has not seen one. When probed more, it seems that patient had left over medication that she has been taking intermittently. -Social work and case management consulted  Elevated troponin -Likely secondary to demand ischemia -Troponin 0.27 upon admission, currently trending downward to 0.18 -Continue to monitor. Cardiology consulted and appreciated -Echocardiogram pending  Acute encephalopathy -Possibly residual from her previous CVA. (As per husband) -Possibly due to DKA. Workup currently unremarkable for infection -Continue to monitor closely  Nausea vomiting -Likely secondary to DKA -Abdominal x-ray unremarkable bowel gas pattern, no evidence of obstruction or pneumoperitoneum -Continue antiemetics as needed -Placed on clear liquid diet, will  advanced as tolerated  Ischemic cardiomyopathy -Echocardiogram in 2015 showed EF 30-35% -Continue Coreg -Patient is supposed to be on ACE inhibitor, will restart. Of note patient has not seen a physician in 2 years.  Essential hypertension -Continue Coreg  Prolonged QT/hypomagnesemia, hypokalemia -Will replace potassium and magnesium accordingly -Avoid QT prolonging medications  Anxiety -Continue Ativan as needed  History of CVA -Will start aspirin and statin -CT head in 2015 did show infarct.  Social issues -Spoke with the husband via phone, does not feel patient has been confused. Patient is been "off" since a small stroke a year and a half ago. Patient has not seen a physician in the urine half to 2 years. Patient's husband states that he helps with her medications. Although when probed, unable to answer questions appropriately. It seems that patient may have left over metformin has not been taken and she has been taking it intermittently. Patient's husband brought in a bag of medications, dated from 2015. -As noted above, social work and case management consulted.  DVT Prophylaxis  lovenox  Code Status: full  Family Communication: none at bedside at time of exam  Disposition Plan: Admitted  Consultants None  Procedures  None  Antibiotics   Anti-infectives    None      Subjective:   Kristen Lozano seen and examined today.    Patient states she is here because she ate too much candy. Does admit to having nausea and vomiting prior to admission. Currently feels hungry. Denies any chest pain, shortness of breath, abdominal pain, dizziness or headache.  Objective:   Vitals:   10/28/16 2300 10/28/16 2359 10/29/16 0312 10/29/16 0804  BP: 105/60 129/70 121/67 128/74  Pulse: 113 (!)  117 (!) 109 (!) 108  Resp: 14 14 12 16   Temp:  99.8 F (37.7 C) 98.4 F (36.9 C) 98.1 F (36.7 C)  TempSrc:  Oral Oral Oral  SpO2: 97% 97% 100% 100%  Weight:  48.8 kg (107 lb 9.4 oz)     Height:  5' 3"  (1.6 m)      Intake/Output Summary (Last 24 hours) at 10/29/16 1252 Last data filed at 10/29/16 1000  Gross per 24 hour  Intake          4664.21 ml  Output              700 ml  Net          3964.21 ml   Filed Weights   10/28/16 2359  Weight: 48.8 kg (107 lb 9.4 oz)    Exam  General: Well developed, well nourished, NAD, appears stated age  HEENT: NCAT,  mucous membranes moist.   Cardiovascular: S1 S2 auscultated, no rubs, murmurs or gallops. Regular rate and rhythm.  Respiratory: Clear to auscultation bilaterally with equal chest rise  Abdomen: Soft, nontender, nondistended, + bowel sounds  Extremities: warm dry without cyanosis clubbing or edema  Neuro: AAOx3 (confused, answers 17 to month and year, knows she is at [facility] hospital, oriented to self, can answer some questions appropriately with some coaching), nonfocal  Psych: Appropriate   Data Reviewed: I have personally reviewed following labs and imaging studies  CBC:  Recent Labs Lab 10/28/16 1647 10/28/16 1724 10/28/16 1750  WBC 11.6* 12.7*  --   NEUTROABS  --  11.4*  --   HGB 13.1 14.2 15.3*  HCT 40.1 42.2 45.0  MCV 66.5* 66.1*  --   PLT 216 225  --    Basic Metabolic Panel:  Recent Labs Lab 10/28/16 1647 10/28/16 1750 10/29/16 0025 10/29/16 0415 10/29/16 0646  NA 139 141 142 143 141  K 3.9 3.6 3.3* 3.1* 2.7*  CL 107 104 115* 115* 113*  CO2 18*  --  19* 22 20*  GLUCOSE 530* 521* 218* 195* 128*  BUN 11 15 <5* 5* <5*  CREATININE 0.93 0.50 0.66 0.56 0.53  CALCIUM 9.0  --  8.1* 8.1* 8.2*  MG  --   --  1.6*  --   --   PHOS  --   --  2.1*  --   --    GFR: Estimated Creatinine Clearance: 56.9 mL/min (by C-G formula based on SCr of 0.53 mg/dL). Liver Function Tests:  Recent Labs Lab 10/28/16 1724  AST 30  ALT 24  ALKPHOS 82  BILITOT 1.1  PROT 8.1  ALBUMIN 4.0    Recent Labs Lab 10/28/16 1724  LIPASE 20   No results for input(s): AMMONIA in the last 168  hours. Coagulation Profile: No results for input(s): INR, PROTIME in the last 168 hours. Cardiac Enzymes:  Recent Labs Lab 10/29/16 0025 10/29/16 0646  TROPONINI 0.20* 0.18*   BNP (last 3 results) No results for input(s): PROBNP in the last 8760 hours. HbA1C: No results for input(s): HGBA1C in the last 72 hours. CBG:  Recent Labs Lab 10/29/16 0600 10/29/16 0702 10/29/16 0819 10/29/16 0923 10/29/16 1028  GLUCAP 134* 129* 125* 149* 148*   Lipid Profile: No results for input(s): CHOL, HDL, LDLCALC, TRIG, CHOLHDL, LDLDIRECT in the last 72 hours. Thyroid Function Tests: No results for input(s): TSH, T4TOTAL, FREET4, T3FREE, THYROIDAB in the last 72 hours. Anemia Panel: No results for input(s): VITAMINB12, FOLATE, FERRITIN, TIBC, IRON, RETICCTPCT  in the last 72 hours. Urine analysis:    Component Value Date/Time   COLORURINE YELLOW 10/28/2016 [place] 10/28/2016 1647   LABSPEC 1.027 10/28/2016 1647   PHURINE 5.5 10/28/2016 1647   GLUCOSEU >1000 (A) 10/28/2016 1647   HGBUR NEGATIVE 10/28/2016 1647   BILIRUBINUR NEGATIVE 10/28/2016 1647   KETONESUR 40 (A) 10/28/2016 1647   PROTEINUR NEGATIVE 10/28/2016 1647   UROBILINOGEN 1.0 09/07/2014 1405   NITRITE NEGATIVE 10/28/2016 1647   LEUKOCYTESUR NEGATIVE 10/28/2016 1647   Sepsis Labs: @LABRCNTIP (procalcitonin:4,lacticidven:4)  ) Recent Results (from the past 240 hour(s))  MRSA PCR Screening     Status: None   Collection Time: 10/29/16 12:34 AM  Result Value Ref Range Status   MRSA by PCR NEGATIVE NEGATIVE Final    Comment:        The GeneXpert MRSA Assay (FDA approved for NASAL specimens only), is one component of a comprehensive MRSA colonization surveillance program. It is not intended to diagnose MRSA infection nor to guide or monitor treatment for MRSA infections.       Radiology Studies: Ct Angio Chest Pe W Or Wo Contrast  Result Date: 10/29/2016 CLINICAL DATA:  Altered mental status  yesterday. Nausea vomiting today. EXAM: CT ANGIOGRAPHY CHEST WITH CONTRAST TECHNIQUE: Multidetector CT imaging of the chest was performed using the standard protocol during bolus administration of intravenous contrast. Multiplanar CT image reconstructions and MIPs were obtained to evaluate the vascular anatomy. CONTRAST:  75 mL Isovue 370 intravenous COMPARISON:  Radiographs 10/28/2016 FINDINGS: Cardiovascular: Satisfactory opacification of the pulmonary arteries to the segmental level. No evidence of pulmonary embolism. Moderate cardiomegaly. No pericardial effusion. Mediastinum/Nodes: No enlarged mediastinal, hilar, or axillary lymph nodes. Thyroid gland, trachea, and esophagus demonstrate no significant findings. Lungs/Pleura: No airspace consolidation. There is mild interlobular septal thickening which could represent a degree of interstitial fluid. No pleural effusions. Airways are patent and normal in caliber. Upper Abdomen: No acute findings. Musculoskeletal: No significant abnormality. Review of the MIP images confirms the above findings. IMPRESSION: 1. Negative for acute pulmonary embolism. 2. Moderate cardiomegaly. 3. Mild interlobular septal thickening, consistent with interstitial fluid or thickening. No airspace consolidation. No pleural or pericardial effusion. Electronically Signed   By: Andreas Newport M.D.   On: 10/29/2016 00:28   Dg Abdomen Acute W/chest  Result Date: 10/28/2016 CLINICAL DATA:  Abdominal pain, nausea and vomiting. EXAM: DG ABDOMEN ACUTE W/ 1V CHEST COMPARISON:  09/04/2014 and prior exams FINDINGS: The cardiomediastinal silhouette is unremarkable. Interstitial prominence noted. There is no evidence of airspace disease, pleural effusion, mass or pneumothorax. The bowel gas pattern is unremarkable. No dilated bowel loops are identified. There is no evidence of bowel obstruction or pneumoperitoneum. No suspicious calcifications are present. No acute bony abnormalities are  identified. IMPRESSION: Unremarkable bowel gas pattern. No evidence of bowel obstruction or pneumoperitoneum. Mild interstitial prominence within both lungs -nonspecific. Electronically Signed   By: Margarette Canada M.D.   On: 10/28/2016 18:53     Scheduled Meds: . carvedilol  12.5 mg Oral BID WC  . [START ON 10/30/2016] enoxaparin (LOVENOX) injection  40 mg Subcutaneous Q24H  . insulin aspart  0-5 Units Subcutaneous QHS  . insulin aspart  0-9 Units Subcutaneous TID WC  . sodium chloride flush  3 mL Intravenous Q12H   Continuous Infusions: . dextrose 5 % and 0.45% NaCl 125 mL/hr at 10/29/16 0801     LOS: 1 day   Time Spent in minutes   30 minutes  Deakon Frix D.O.  on 10/29/2016 at 12:52 PM  Between 7am to 7pm - Pager - [phone]  After 7pm go to [email or web address] - password [email or web address]  And look for the night coverage person covering for me after hours  [facility] Office  [phone]

## 2016-10-29 NOTE — Progress Notes (Signed)
CSW consult acknowledged re: "No PCP, needs diabetes meds and glucometer". RN Case Manager notified. CSW signing off. Please contact if new need(s) arise.          Emiliano Dyer, LCSW [facility] ED/27M Clinical Social Worker [phone]

## 2016-10-29 NOTE — Consult Note (Signed)
Cardiology Consult    Patient ID: Kristen Lozano MRN: [id number], DOB/AGE: 61-Sep-1957   Admit date: 10/28/2016 Date of Consult: 10/29/2016  Primary Physician: No primary care provider on file. Reason for Consult: Chest Pain, Elevated Troponin Primary Cardiologist: Dr. Debara Pickett - Last seen in 2015 Requesting Provider: Dr. Ree Kida   History of Present Illness    Kristen Lozano is a 60 y.o. female with past medical history of known cardiomyopathy (EF 30-35% in 2015, presumed to be ischemic in the setting of abnormal NST), Type 2 DM, HTN, HLD, and prior CVA (2015) who presented to [facility] ED on 10/28/2016 for worsening confusion, nausea, and vomiting.  Per review of notes, she was confused starting two days prior to admission according to family. Mental status improved on 12/4 but she developed nausea, vomiting, and abdominal pain. EMS was called and her initial glucose reading was > 600. Felt diaphoretic and cool to touch.   Initial labs showed a WBC of 11.6, Hgb 13.1, platelets 216. K+ 3.9. Glucose 530. Creatinine 0.93. Cyclic troponin values have been elevated at 0.27, 0.20, and 0.18. D-dimer at 7.54. Mg 1.6. CTA negative for acute PE with mild cardiomegaly and mild interlobular septal thickening being noted. Initial EKG showed sinus tachycardia, HR 120, QTc 554 ms, with LVH and diffuse TWI in the anterior and inferior leads with moderate LVH.   In talking with the patient today, she is A&Ox2 but disoriented and confused about her current situation. Says she "feels well and wants to go home". When I ask about her cardiac history she says "my heart is fine". Unable to recall being seen by Cardiology in 2015. She denies any recent chest discomfort or dyspnea with exertion. Says she ambulates well around the house and uses a cane as needed. No family available at the bedside during this encounter. Again, by review of notes, she was not taking any medications appropriately at home. Rx's were from  2015 and she does not have a current PCP.   Was last seen by Cardiology while admitted in 08/2014. An echocardiogram was performed at that time in the setting of her acute CVA which showed an EF of 30-35% with global HK and inferior AK. A stress test was performed and showed an anteroseptal and inferior wall scar with potential inducible ischemia in the small region of the anterior wall. EF was at 37% and overall the test was intermediate-risk. No invasive evaluation was pursued in the setting of her recent CVA and instead medical management was recommended. She was seen in the office by Dr. Debara Pickett following her hospitalization and the patient's family were not interested in a cath at that time. At that time his note mentioned: I would recommend a repeat echocardiogram in 6 months. If her EF is not improved at that time we may need to consider ischemic workup if they are more amenable to it +/- ICD evaluation.   Past Medical History   Past Medical History:  Diagnosis Date  . Allergic rhinitis    Requires cetirizine, singulair, and fluticasone.  . Anxiety    Has been on Xanax since 2009. Uses it for stress, anxiety, and insomnia. No contract yet.  . Arthritis   . CHF (congestive heart failure) (HCC)    EF 35% after stroke, presumed ischemic  . Chronic pain    Has OA of knees B. No Xrays in echart. Not requiring narcotics.  . Depression   . Diabetes mellitus    Type 2,  non insulin dependent. Was dx'd prior to 2008.  Marland Kitchen Hyperglycemia   . Hyperlipemia   . Hypertension   . Sickle cell trait ([place])   . 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 TEE WITHOUT CARDIOVERSION N/A 09/06/2014   Procedure: TRANSESOPHAGEAL ECHOCARDIOGRAM (TEE);  Surgeon: Pixie Casino, MD;  Location: [facility] ENDOSCOPY;  Service: Cardiovascular;  Laterality: N/A;     Allergies  No Known Allergies  Inpatient Medications     . aspirin  325 mg Oral Daily  . atorvastatin  20 mg Oral q1800  . carvedilol  12.5 mg Oral BID WC  . [START ON 10/30/2016] enoxaparin (LOVENOX) injection  40 mg Subcutaneous Q24H  . insulin aspart  0-5 Units Subcutaneous QHS  . insulin aspart  0-9 Units Subcutaneous TID WC  . lisinopril  10 mg Oral Daily  . magnesium sulfate 1 - 4 g bolus IVPB  2 g Intravenous Once  . sodium chloride flush  3 mL Intravenous Q12H   . dextrose 5 % and 0.45% NaCl 125 mL/hr at 10/29/16 0801    Family History    Family History  Problem Relation Age of Onset  . Diabetes Mother   . Hypertension Mother   . Heart disease Father   . Heart attack Father   . Hypertension Father   . Diabetes Father   . Ovarian cancer Sister   . Liver cancer Sister   . Sickle cell anemia Daughter   . Schizophrenia Daughter   . Hypertension Sister   . Hypertension Brother   . Hypertension Daughter   . Kidney disease Sister     x2  . Kidney disease Brother   . Stroke Neg Hx   . Esophageal cancer Neg Hx   . Colon cancer Neg Hx   . Colon polyps Neg Hx     Social History    Social History   Social History  . Marital status: Legally Separated    Spouse name: N/A  . Number of children: 2  . Years of education: 31 TH   Occupational History  . Disabled    Social History Main Topics  . Smoking status: Never Smoker  . Smokeless tobacco: Never Used  . Alcohol use No  . Drug use: No  . Sexual activity: Not on file   Other Topics Concern  . Not on file   Social History Narrative   Patient is married with 2 children.   Patient is right handed.   Patient has 9 th grade education.   Patient drinks 2 cups daily.     Review of Systems    General:  No chills, fever, night sweats or weight changes.  Cardiovascular:  No chest pain, dyspnea on exertion, edema, orthopnea, palpitations, paroxysmal nocturnal dyspnea. Dermatological: No rash, lesions/masses Respiratory: No cough, dyspnea Urologic: No hematuria,  dysuria Abdominal:   No diarrhea, bright red blood per rectum, melena, or hematemesis. Positive for nausea, vomiting, and abdominal pain. Neurologic:  No visual changes, wkns, changes in mental status. All other systems reviewed and are otherwise negative except as noted above.  Physical Exam    Blood pressure 123/68, pulse (!) 109, temperature 98.1 F (36.7 C), temperature source Oral, resp. rate 19, height 5' 3"  (1.6 m), weight 107 lb 9.4 oz (48.8 kg), SpO2 96 %.  General: Pleasant, African American female appearing in NAD. Psych: Normal affect. Neuro: Alert  and oriented X 2 (person, place). Moves all extremities spontaneously. HEENT: Normal  Neck: Supple without bruits or JVD. Lungs:  Resp regular and unlabored, CTA without wheezing or rales. Heart: Regular rhythm, tachycardiac rate, no s3, s4, or murmurs. Abdomen: Soft, non-tender, non-distended, BS + x 4.  Extremities: No clubbing, cyanosis or edema. DP/PT/Radials 2+ and equal bilaterally.  Labs    Troponin [facility] Hights of Care Test)  Recent Labs  10/28/16 1805  TROPIPOC 0.27*    Recent Labs  10/29/16 0025 10/29/16 0646  TROPONINI 0.20* 0.18*   Lab Results  Component Value Date   WBC 12.7 (H) 10/28/2016   HGB 15.3 (H) 10/28/2016   HCT 45.0 10/28/2016   MCV 66.1 (L) 10/28/2016   PLT 225 10/28/2016    Recent Labs Lab 10/28/16 1724  10/29/16 1310  NA  --   < > 136  K  --   < > 2.9*  CL  --   < > 109  CO2  --   < > 21*  BUN  --   < > <5*  CREATININE  --   < > 0.55  CALCIUM  --   < > 7.9*  PROT 8.1  --   --   BILITOT 1.1  --   --   ALKPHOS 82  --   --   ALT 24  --   --   AST 30  --   --   GLUCOSE  --   < > 276*  < > = values in this interval not displayed. Lab Results  Component Value Date   CHOL 141 09/05/2014   HDL 49 09/05/2014   LDLCALC 77 09/05/2014   TRIG 76 09/05/2014   Lab Results  Component Value Date   DDIMER 7.54 (H) 10/28/2016     Radiology Studies    Ct Angio Chest Pe W Or Wo  Contrast  Result Date: 10/29/2016 CLINICAL DATA:  Altered mental status yesterday. Nausea vomiting today. EXAM: CT ANGIOGRAPHY CHEST WITH CONTRAST TECHNIQUE: Multidetector CT imaging of the chest was performed using the standard protocol during bolus administration of intravenous contrast. Multiplanar CT image reconstructions and MIPs were obtained to evaluate the vascular anatomy. CONTRAST:  75 mL Isovue 370 intravenous COMPARISON:  Radiographs 10/28/2016 FINDINGS: Cardiovascular: Satisfactory opacification of the pulmonary arteries to the segmental level. No evidence of pulmonary embolism. Moderate cardiomegaly. No pericardial effusion. Mediastinum/Nodes: No enlarged mediastinal, hilar, or axillary lymph nodes. Thyroid gland, trachea, and esophagus demonstrate no significant findings. Lungs/Pleura: No airspace consolidation. There is mild interlobular septal thickening which could represent a degree of interstitial fluid. No pleural effusions. Airways are patent and normal in caliber. Upper Abdomen: No acute findings. Musculoskeletal: No significant abnormality. Review of the MIP images confirms the above findings. IMPRESSION: 1. Negative for acute pulmonary embolism. 2. Moderate cardiomegaly. 3. Mild interlobular septal thickening, consistent with interstitial fluid or thickening. No airspace consolidation. No pleural or pericardial effusion. Electronically Signed   By: Andreas Newport M.D.   On: 10/29/2016 00:28   Dg Abdomen Acute W/chest  Result Date: 10/28/2016 CLINICAL DATA:  Abdominal pain, nausea and vomiting. EXAM: DG ABDOMEN ACUTE W/ 1V CHEST COMPARISON:  09/04/2014 and prior exams FINDINGS: The cardiomediastinal silhouette is unremarkable. Interstitial prominence noted. There is no evidence of airspace disease, pleural effusion, mass or pneumothorax. The bowel gas pattern is unremarkable. No dilated bowel loops are identified. There is no evidence of bowel obstruction or pneumoperitoneum. No  suspicious calcifications are present. No acute bony abnormalities  are identified. IMPRESSION: Unremarkable bowel gas pattern. No evidence of bowel obstruction or pneumoperitoneum. Mild interstitial prominence within both lungs -nonspecific. Electronically Signed   By: Margarette Canada M.D.   On: 10/28/2016 18:53    EKG & Cardiac Imaging    EKG: Sinus tachycardia, HR 120, QTc 554 ms, with LVH and diffuse TWI in the anterior and inferior leads with moderate LVH  Echocardiogram: 08/2014 Study Conclusions  - Left ventricle: There was mild concentric hypertrophy. Dilated ventricle. Severe global hypokinesis with inferior akinesis - LVEF 30-35%. - Aortic valve: No evidence of vegetation. - Mitral valve: Structurally normal valve. No evidence of vegetation. There was mild regurgitation. - Left atrium: The atrium was dilated. No evidence of thrombus in the atrial cavity or appendage. - Right atrium: No evidence of thrombus in the atrial cavity or appendage. - Atrial septum: No defect or patent foramen ovale was identified. - Tricuspid valve: No evidence of vegetation.  Impressions:  - No cardiac source of emboli was indentified.    Assessment & Plan    1. Cardiomyopathy, Presumed to be Ischemic - patient has a known cardiomyopathy with EF 30-35% in 2015. NST performed at the time of her echo showed an anteroseptal and inferior wall scar with potential inducible ischemia in the small region of the anterior wall with the test being intermediate-risk. No invasive evaluation was pursued in the setting of her recent CVA and instead medical management was recommended. Seen in the outpatient setting several weeks later and the patient and her family declined a cath at that time.  - she has not followed up with Cardiology or a PCP since. Not thought to be compliant with her PTA BB or ACE-I, as her Rx bottles date back to 2015. - she does not appear volume overloaded on physical exam.  Thankfully, she denies any repeat episodes of chest discomfort or dyspnea with exertion. Monitor fluid status closely in the setting of reduced EF.  - repeat echocardiogram is pending to assess LV function and wall motion. Agree with restarting Coreg 12.36m BID and Lisinopril 128mdaily. Would titrate as HR and BP allows (currently tachycardiac on exam, likely driven by her DKA). Will need to discuss further workup with family if EF remains reduced, however there is concern for compliance going forward.   2. Elevated Troponin - cyclic troponin values have been elevated at 0.27, 0.20, and 0.18. EKG shows sinus tachycardia, HR 120, QTc 554 ms, with LVH and diffuse TWI in the anterior and inferior leads with moderate LVH.  - the patient denies any recent chest discomfort or dyspnea with exertion over the past several weeks. No family at the bedside that can verify this.  - repeat echocardiogram pending to assess LV function and wall motion. Further recommendations pending following results.   3. HTN - BP at 105/60 - 161/98 in the past 24 hours.  - restarted on Coreg 12.43m22mID and Lisinopril 105m52mily. Continue to monitor BP closely.   4. HLD - possibly on Lipitor 20mg53mly PTA.  - check Lipid Panel and LFT's in AM.  5. Prolonged QTc -  QTc 554 ms on EKG on admission.  - K+ 2.9 this AM, Mg 1.6. Being replaced.  - repeat EKG in AM.  6. DKA in the Setting of Type 2 DM - according to previous notes, patient's current Rx for Metformin was from 2015.  - Glucose > 600 upon EMS arrival. At 148 on most recent check. - per admitting team  7.  Acute Encephalopathy - A&Ox2 on exam today but unable to recount past medical history. No family present during the encounter.   Signed, Erma Heritage, PA-C 10/29/2016, 2:11 PM Pager: [phone]   The patient was seen, examined and discussed with Bernerd Pho, PA-C and I agree with the above.    Kristen Lozano is a 60 y.o. female with a hx of  HTN, DM2, sickle cell trait, OA, h/o ischemic CVA with residual expressive aphasia, ischemic cardiomyopathy with LVEF 30-35% followed by Dr Debara Pickett. She had a stress test over 2015 which demonstrated ant-septal and inf scar with small area of distal anterior ischemia.  However, cath was felt to be contraindicated in the setting of a recent CVA. Coumadin was considered given reduced EF and likely thromboembolic CVA.  However, the family was hesitant to start this medication.  She has been lost to follow-up in the last 2 years.  She was now admitted on 10/28/2016 for worsening confusion, nausea, and vomiting starting two days prior to admission. Initial  glucose reading was > 600, WBC of 11.6, Hgb 13.1, platelets 216. K+ 3.9. Glucose 530. Creatinine 0.93. Cyclic troponin values have been elevated at 0.27, 0.20, and 0.18. ECG  CTA negative for acute PE.  Her EKG shows sinus tachycardia and diffuse ST depressions and negative T waves, these are very different from the prior EKG in 2015. On physical exam patient is somnolent but oriented 2, currently denies any chest pain or shortness of breath, and she appears euvolemic.  Her last echocardiogram in 2015 showed mild concentric hypertrophy. Dilated ventricle. Severe global hypokinesis with inferior akinesis LVEF 30-35%.  At this point I would focus to aggressively correct her metabolic abnormalities, replace magnesium and potassium. Her troponin elevation is minimal, considering she has a history of stroke with residual aphasia, she still a poor candidate for cardiac catheterization. We will recheck echocardiogram and if there are significant wall motion abnormalities we will consider a stress test.  We will increase carvedilol to 25 mg po BID. Continue iv hydration.   Kristen Lozano 10/29/2016

## 2016-10-29 NOTE — Progress Notes (Signed)
Notified cardiology about SBP in 70's and I was concerned about IVF bolus because last EF 35% in 2015. Patient was given coreg and lisinopril early however she hasn't been taking her lisinopril at home just the coreg. Dr. Radford Pax made aware and the plan is for dopamine gtt. I will continue to monitor.

## 2016-10-29 NOTE — Progress Notes (Signed)
CRITICAL VALUE ALERT  Critical value received:  Potassium 2.7  Date of notification:  12/5  Time of notification:  07:32  Critical value read back:Yes.    Nurse who received alert:  Josie Dixon    MD notified (1st page):  Mikhail  Time of first page:  07:34

## 2016-10-29 NOTE — Progress Notes (Signed)
Spouse (Al Dibler) brought in medication from home.  The majority of the medications were from 2015 with the exception of metformin and Coreg.  Jason Fila stated that Mrs. Slate has not been taking her metformin regularly and eats "candy and cakes" without monitoring her blood sugar.  Mr. Deutschman has concern over Mrs. Kleven needing additional coverage like insulin.  In addition, the daughter stated "who is going to give her insulin" at home.  Information was related to the attending MD and I spoke with the Diabetes Coordinator for additional assistance.

## 2016-10-30 ENCOUNTER — Inpatient Hospital Stay (HOSPITAL_COMMUNITY): Payer: Medicare Other

## 2016-10-30 DIAGNOSIS — I429 Cardiomyopathy, unspecified: Secondary | ICD-10-CM

## 2016-10-30 DIAGNOSIS — R7989 Other specified abnormal findings of blood chemistry: Secondary | ICD-10-CM

## 2016-10-30 LAB — CBC
HEMATOCRIT: 37.9 % (ref 36.0–46.0)
HEMOGLOBIN: 12.3 g/dL (ref 12.0–15.0)
MCH: 21.7 pg — AB (ref 26.0–34.0)
MCHC: 32.5 g/dL (ref 30.0–36.0)
MCV: 66.7 fL — AB (ref 78.0–100.0)
Platelets: 183 10*3/uL (ref 150–400)
RBC: 5.68 MIL/uL — AB (ref 3.87–5.11)
RDW: 14.7 % (ref 11.5–15.5)
WBC: 9.8 10*3/uL (ref 4.0–10.5)

## 2016-10-30 LAB — GLUCOSE, CAPILLARY
GLUCOSE-CAPILLARY: 294 mg/dL — AB (ref 65–99)
GLUCOSE-CAPILLARY: 241 mg/dL — AB (ref 65–99)
GLUCOSE-CAPILLARY: 245 mg/dL — AB (ref 65–99)
Glucose-Capillary: 318 mg/dL — ABNORMAL HIGH (ref 65–99)
Glucose-Capillary: 148 mg/dL — ABNORMAL HIGH (ref 65–99)
Glucose-Capillary: 210 mg/dL — ABNORMAL HIGH (ref 65–99)

## 2016-10-30 LAB — LIPID PANEL
Cholesterol: 146 mg/dL (ref 0–200)
HDL: 44 mg/dL (ref >40)
LDL CALC: 81 mg/dL (ref 0–99)
Total CHOL/HDL Ratio: 3.3 RATIO
Triglycerides: 104 mg/dL (ref <150)
VLDL: 21 mg/dL (ref 0–40)

## 2016-10-30 LAB — BASIC METABOLIC PANEL
Anion gap: 5 (ref 5–15)
CHLORIDE: 111 mmol/L (ref 101–111)
CO2: 22 mmol/L (ref 22–32)
Calcium: 8.7 mg/dL — ABNORMAL LOW (ref 8.9–10.3)
Creatinine, Ser: 0.56 mg/dL (ref 0.44–1.00)
GFR calc Af Amer: >60 mL/min (ref >60)
GFR calc non Af Amer: >60 mL/min (ref >60)
GLUCOSE: 295 mg/dL — AB (ref 65–99)
POTASSIUM: 3.7 mmol/L (ref 3.5–5.1)
SODIUM: 138 mmol/L (ref 135–145)

## 2016-10-30 LAB — HEMOGLOBIN A1C: Hgb A1c MFr Bld: >15.5 % — ABNORMAL HIGH (ref 4.8–5.6)

## 2016-10-30 LAB — HEPATIC FUNCTION PANEL
ALK PHOS: 81 U/L (ref 38–126)
ALT: 45 U/L (ref 14–54)
AST: 84 U/L — AB (ref 15–41)
Albumin: 2.9 g/dL — ABNORMAL LOW (ref 3.5–5.0)
BILIRUBIN DIRECT: 0.1 mg/dL (ref 0.1–0.5)
Indirect Bilirubin: 0.7 mg/dL (ref 0.3–0.9)
Total Bilirubin: 0.8 mg/dL (ref 0.3–1.2)
Total Protein: 6.3 g/dL — ABNORMAL LOW (ref 6.5–8.1)

## 2016-10-30 LAB — MAGNESIUM: MAGNESIUM: 2 mg/dL (ref 1.7–2.4)

## 2016-10-30 LAB — ECHOCARDIOGRAM COMPLETE
Height: 63 in
Weight: 1721.35 oz

## 2016-10-30 MED ORDER — INSULIN GLARGINE 100 UNIT/ML ~~LOC~~ SOLN
20.0000 [IU] | Freq: Every day | SUBCUTANEOUS | Status: DC
  Filled 2016-10-30: qty 0.2

## 2016-10-30 MED ORDER — INSULIN GLARGINE 100 UNIT/ML ~~LOC~~ SOLN
20.0000 [IU] | Freq: Every day | SUBCUTANEOUS | Status: DC
  Administered 2016-10-30 – 2016-11-01 (×3): 20 [IU] via SUBCUTANEOUS
  Filled 2016-10-30 (×3): qty 0.2

## 2016-10-30 MED ORDER — INSULIN ASPART 100 UNIT/ML ~~LOC~~ SOLN
5.0000 [IU] | Freq: Three times a day (TID) | SUBCUTANEOUS | Status: DC
  Administered 2016-10-30 – 2016-11-01 (×6): 5 [IU] via SUBCUTANEOUS

## 2016-10-30 NOTE — Progress Notes (Signed)
PROGRESS NOTE    Kristen Lozano  LPF:[id number] DOB: 1956-07-18 DOA: 10/28/2016 PCP: No primary care provider on file.     Brief Narrative:  Kristen Lozano is a 60 y.o. female with a past medical history significant for NIDDM, HTN, isch CM with EF 35%, and hx of CVA who presents with 2 days confusion and vomiting. She was admitted due to DKA, elevated troponin.   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 Suspect secondary to noncompliance of medications, lack of follow-up, excess sugar intake. Improved on glucostabilizer and now on Lantus, mealtime novolog and SSI  -Hemoglobin A1c pending   Ischemic cardiomyopathy -Echocardiogram in 2015 showed EF 30-35% and declined heart cath -Restart Coreg, lisinopril -Echocardiogram pending -Appreciate cardiology  -Dopamine gtt per cardiology   Elevated troponin -Likely secondary to demand ischemia. Troponin 0.27 upon admission, currently trending downward to 0.18. Continue to monitor. Cardiology consulted and appreciated -Echocardiogram pending  Acute encephalopathy -Possibly residual from her previous CVA. (As per husband) -Possibly due to DKA. Workup currently unremarkable for infection  -Continue to monitor closely, now back to baseline   Essential hypertension -Continue Coreg, lisinopril, monitor  Prolonged QT -Avoid QT prolonging medications  Anxiety -Continue Ativan as needed  History of CVA -Resume aspirin, lipitor    Medical noncompliance -Appreciate SW/CM    DVT prophylaxis: lovenox Code Status: Full Family Communication: husband at bedside, over the phone  Disposition Plan: pending further improvement and work up, SNF recommended    Consultants:   Cardiology  Procedures:   None  Antimicrobials:   None    Subjective: Patient examined with husband at bedside. He states that her mentation is now baseline. She continues to be in denial with her medical condition and states that she is okay and there is nothing wrong with her heart. I discussed with both of them that she will likely require insulin at discharge as well as few cardiac medication and close follow up with PCP and cardiology at minimum. She continues to state that she does not need these things. I discussed with husband that they have an open discussion regarding her level of care and medical status. She has no complaints on exam; denies chest pain, SOB, n/v.    Objective: Vitals:   10/30/16 0630 10/30/16 0645 10/30/16 0730 10/30/16 0820  BP: (!) 91/57 117/70 (!) 93/54 126/62  Pulse: 98 99 (!) 102 (!) 101  Resp: (!) 24 (!) 24 17 (!) 23  Temp:    98.5 F (36.9 C)  TempSrc:    Oral  SpO2: 98% 100% 97% 100%  Weight:      Height:        Intake/Output Summary (Last 24 hours) at 10/30/16 1112 Last data filed at 10/30/16 0900  Gross per 24 hour  Intake             3670 ml  Output              550 ml  Net  3120 ml   Filed Weights   10/28/16 2359  Weight: 48.8 kg (107 lb 9.4 oz)    Examination:  General exam: Appears calm and comfortable  Respiratory system: Clear to auscultation. Respiratory effort normal. Cardiovascular system: S1 & S2 heard, tachycardic, regular rhythm. No JVD, murmurs, rubs, gallops or clicks. No pedal edema. Gastrointestinal system: Abdomen is nondistended, soft and nontender. No organomegaly or masses felt. Normal bowel sounds heard. Central nervous system: Alert and oriented. No  focal neurological deficits. Extremities: Symmetric 5 x 5 power. Skin: No rashes, lesions or ulcers  Data Reviewed: I have personally reviewed following labs and imaging studies  CBC:  Recent Labs Lab 10/28/16 1647 10/28/16 1724 10/28/16 1750 10/30/16 0240  WBC 11.6* 12.7*  --  9.8  NEUTROABS  --  11.4*  --   --   HGB 13.1 14.2 15.3* 12.3  HCT 40.1 42.2 45.0 37.9  MCV 66.5* 66.1*  --  66.7*  PLT 216 225  --  253   Basic Metabolic Panel:  Recent Labs Lab 10/29/16 0025 10/29/16 0415 10/29/16 0646 10/29/16 1310 10/30/16 0240  NA 142 143 141 136 138  K 3.3* 3.1* 2.7* 2.9* 3.7  CL 115* 115* 113* 109 111  CO2 19* 22 20* 21* 22  GLUCOSE 218* 195* 128* 276* 295*  BUN <5* 5* <5* <5* <5*  CREATININE 0.66 0.56 0.53 0.55 0.56  CALCIUM 8.1* 8.1* 8.2* 7.9* 8.7*  MG 1.6*  --   --   --  2.0  PHOS 2.1*  --   --   --   --    GFR: Estimated Creatinine Clearance: 56.9 mL/min (by C-G formula based on SCr of 0.56 mg/dL). Liver Function Tests:  Recent Labs Lab 10/28/16 1724 10/30/16 0240  AST 30 84*  ALT 24 45  ALKPHOS 82 81  BILITOT 1.1 0.8  PROT 8.1 6.3*  ALBUMIN 4.0 2.9*    Recent Labs Lab 10/28/16 1724  LIPASE 20   No results for input(s): AMMONIA in the last 168 hours. Coagulation Profile: No results for input(s): INR, PROTIME in the last 168 hours. Cardiac Enzymes:  Recent Labs Lab 10/29/16 0025 10/29/16 0646  TROPONINI 0.20* 0.18*   BNP (last 3 results) No results for input(s): PROBNP in the last 8760 hours. HbA1C: No results for input(s): HGBA1C in the last 72 hours. CBG:  Recent Labs Lab 10/29/16 1218 10/29/16 1645 10/29/16 1742 10/29/16 2110 10/30/16 0813  GLUCAP 258* 318* 270* 274* 294*   Lipid Profile:  Recent Labs  10/30/16 0240  CHOL 146  HDL 44  LDLCALC 81  TRIG 104  CHOLHDL 3.3   Thyroid Function Tests: No results for input(s): TSH, T4TOTAL, FREET4, T3FREE, THYROIDAB in the last 72 hours. Anemia Panel: No results for  input(s): VITAMINB12, FOLATE, FERRITIN, TIBC, IRON, RETICCTPCT in the last 72 hours. Sepsis Labs: No results for input(s): PROCALCITON, LATICACIDVEN in the last 168 hours.  Recent Results (from the past 240 hour(s))  MRSA PCR Screening     Status: None   Collection Time: 10/29/16 12:34 AM  Result Value Ref Range Status   MRSA by PCR NEGATIVE NEGATIVE Final    Comment:        The GeneXpert MRSA Assay (FDA approved for NASAL specimens only), is one component of a comprehensive MRSA colonization surveillance program. It is not intended to diagnose MRSA infection nor to guide or monitor treatment for MRSA infections.        Radiology Studies: Ct Angio Chest Pe  W Or Wo Contrast  Result Date: 10/29/2016 CLINICAL DATA:  Altered mental status yesterday. Nausea vomiting today. EXAM: CT ANGIOGRAPHY CHEST WITH CONTRAST TECHNIQUE: Multidetector CT imaging of the chest was performed using the standard protocol during bolus administration of intravenous contrast. Multiplanar CT image reconstructions and MIPs were obtained to evaluate the vascular anatomy. CONTRAST:  75 mL Isovue 370 intravenous COMPARISON:  Radiographs 10/28/2016 FINDINGS: Cardiovascular: Satisfactory opacification of the pulmonary arteries to the segmental level. No evidence of pulmonary embolism. Moderate cardiomegaly. No pericardial effusion. Mediastinum/Nodes: No enlarged mediastinal, hilar, or axillary lymph nodes. Thyroid gland, trachea, and esophagus demonstrate no significant findings. Lungs/Pleura: No airspace consolidation. There is mild interlobular septal thickening which could represent a degree of interstitial fluid. No pleural effusions. Airways are patent and normal in caliber. Upper Abdomen: No acute findings. Musculoskeletal: No significant abnormality. Review of the MIP images confirms the above findings. IMPRESSION: 1. Negative for acute pulmonary embolism. 2. Moderate cardiomegaly. 3. Mild interlobular septal  thickening, consistent with interstitial fluid or thickening. No airspace consolidation. No pleural or pericardial effusion. Electronically Signed   By: Andreas Newport M.D.   On: 10/29/2016 00:28   Dg Abdomen Acute W/chest  Result Date: 10/28/2016 CLINICAL DATA:  Abdominal pain, nausea and vomiting. EXAM: DG ABDOMEN ACUTE W/ 1V CHEST COMPARISON:  09/04/2014 and prior exams FINDINGS: The cardiomediastinal silhouette is unremarkable. Interstitial prominence noted. There is no evidence of airspace disease, pleural effusion, mass or pneumothorax. The bowel gas pattern is unremarkable. No dilated bowel loops are identified. There is no evidence of bowel obstruction or pneumoperitoneum. No suspicious calcifications are present. No acute bony abnormalities are identified. IMPRESSION: Unremarkable bowel gas pattern. No evidence of bowel obstruction or pneumoperitoneum. Mild interstitial prominence within both lungs -nonspecific. Electronically Signed   By: Margarette Canada M.D.   On: 10/28/2016 18:53      Scheduled Meds: . aspirin  325 mg Oral Daily  . atorvastatin  20 mg Oral q1800  . carvedilol  25 mg Oral BID WC  . enoxaparin (LOVENOX) injection  40 mg Subcutaneous Q24H  . insulin aspart  0-5 Units Subcutaneous QHS  . insulin aspart  0-9 Units Subcutaneous TID WC  . insulin aspart  5 Units Subcutaneous TID WC  . insulin glargine  20 Units Subcutaneous Daily  . lisinopril  10 mg Oral Daily  . sodium chloride flush  3 mL Intravenous Q12H   Continuous Infusions: . DOPamine 5.027 mcg/kg/min (10/30/16 0625)     LOS: 2 days    Time spent: 40 minutes   Dessa Phi, DO [facility] [email or web address] Password [email or web address] 10/30/2016, 11:12 AM

## 2016-10-30 NOTE — Evaluation (Signed)
Physical Therapy Evaluation Patient Details Name: Kristen Lozano MRN: [id number] DOB: Sep 06, 1956 Today's Date: 10/30/2016   History of Present Illness  Kristen Lozano is a 60 y.o. female with a past medical history significant for NIDDM, HTN, isch CM with EF 35%, and hx of CVA who presents with 2 days confusion and vomiting.  Clinical Impression  Pt functioning at minA level due to impaired cognition, decreased insight to deficits, decreased safety awareness, and impaired balance/increased falls risk. Spoke with spouse regarding pt needing 24/7 assist, pt reports he can provide that however unsure he understands the magnitude of what she needs medically, ie. Insulin shots. Pt states "i don't need shots." Pt unsafe to return home alone as she is unable to care for herself both physically and medically. Family is inconsistent with report of their ability to assist pt. Recommend ST-SNF upon d/c to allow for pt to achieve safe mod I level of assist for safe transition home.    Follow Up Recommendations SNF;Supervision/Assistance - 24 hour    Equipment Recommendations  None recommended by PT    Recommendations for Other Services       Precautions / Restrictions Precautions Precautions: Fall Precaution Comments: lower education/comprehension deficits Restrictions Weight Bearing Restrictions: No      Mobility  Bed Mobility Overal bed mobility: Needs Assistance Bed Mobility: Supine to Sit     Supine to sit: Min guard     General bed mobility comments: pt milldy implusive and unaware of lines, v/c's for safety  Transfers Overall transfer level: Needs assistance Equipment used: None Transfers: Sit to/from [facility] Sit to Stand: Min assist Stand pivot transfers: Min assist       General transfer comment: pt impulsive, trying to get to the [facility], minA for safety, pt with noted instability, minA to steady  Ambulation/Gait Ambulation/Gait assistance: Min  guard Ambulation Distance (Feet): 300 Feet Assistive device: None Gait Pattern/deviations: Step-through pattern;Narrow base of support Gait velocity: wfl Gait velocity interpretation: at or above normal speed for age/gender General Gait Details: pt with staggering L/R, occasional crossover gait pattern, no overt LOB but min guard to maintain stability  Stairs            Wheelchair Mobility    Modified Rankin (Stroke Patients Only)       Balance Overall balance assessment: Needs assistance Sitting-balance support: Feet supported;No upper extremity supported Sitting balance-Leahy Scale: Good     Standing balance support: No upper extremity supported;During functional activity Standing balance-Leahy Scale: Fair Standing balance comment: pt stood and performed peri-care s/p urinary incontinence, min guard to maintain balance                             Pertinent Vitals/Pain Pain Assessment: No/denies pain    Home Living Family/patient expects to be discharged to:: Unsure Living Arrangements: Alone               Additional Comments: pt reports her spouse doesn't live with her, that spouse and dtr check in but she lives alone. house has 3 steps to enter, tub/shower    Prior Function Level of Independence: Independent         Comments: doesn't drive, dependent on spouse and dtr for grocery shopping, does make herself something to eat, doesn't clean, dresses/baths self     Hand Dominance   Dominant Hand: Right    Extremity/Trunk Assessment   Upper Extremity Assessment: Overall WFL for tasks  assessed           Lower Extremity Assessment:  (strength WFL however impaired coordination)      Cervical / Trunk Assessment: Normal  Communication   Communication: Receptive difficulties  Cognition Arousal/Alertness: Awake/alert Behavior During Therapy: Impulsive Overall Cognitive Status: No family/caregiver present to determine baseline  cognitive functioning Area of Impairment: Safety/judgement;Problem solving         Safety/Judgement: Decreased awareness of safety;Decreased awareness of deficits   Problem Solving: Slow processing;Requires verbal cues;Requires tactile cues General Comments: pt with impaired comprehension most likely due to lower education level    General Comments      Exercises     Assessment/Plan    PT Assessment Patient needs continued PT services  PT Problem List Decreased strength;Decreased range of motion;Decreased activity tolerance;Decreased balance;Decreased mobility          PT Treatment Interventions Gait training;Stair training;Functional mobility training;Therapeutic activities;Therapeutic exercise    PT Goals (Current goals can be found in the Care Plan section)  Acute Rehab PT Goals Patient Stated Goal: home PT Goal Formulation: With patient Time For Goal Achievement: 11/06/16 Potential to Achieve Goals: Fair Additional Goals Additional Goal #1: Pt to score >19 on DGI to indicate minimal falls risk.    Frequency Min 3X/week   Barriers to discharge Decreased caregiver support doesn't live with spouse but sister and spouse report they can provide 24/7 assist    Co-evaluation               End of Session Equipment Utilized During Treatment: Gait belt Activity Tolerance: Patient tolerated treatment well Patient left: in chair;with call bell/phone within reach;with chair alarm set Nurse Communication: Mobility status         Time: [id number] PT Time Calculation (min) (ACUTE ONLY): 24 min   Charges:   PT Evaluation $PT Eval Moderate Complexity: 1 Procedure PT Treatments $Gait Training: 8-22 mins   PT G CodesKoleen Distance Keyara Lozano 10/30/2016, 9:44 AM   Kristen Lozano, PT, DPT Pager #: [phone] Office #: [phone]

## 2016-10-30 NOTE — Progress Notes (Addendum)
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 She had a stress test over 2015 which demonstrated ant-septal and inf scar with small area of distal anterior ischemia. However, cath was felt to be contraindicated in the setting of a recent CVA. Coumadin was considered given reduced EF and likely thromboembolic CVA. However, the family was hesitant to start this medication. She has been lost to follow-up in the last 2 years. Her last echocardiogram in 2015 showed mild concentric hypertrophy. Dilated ventricle. Severe global hypokinesis with inferior akinesis LVEF 30-35%.  SUBJECTIVE  The patient denies chest pain or SOB.   CURRENT MEDS . aspirin  325 mg Oral Daily  . atorvastatin  20 mg Oral q1800  . carvedilol  25 mg Oral BID WC  . enoxaparin (LOVENOX) injection  40 mg Subcutaneous Q24H  . insulin aspart  0-5 Units Subcutaneous QHS  . insulin aspart  0-9 Units Subcutaneous TID WC  . insulin aspart  5 Units Subcutaneous TID WC  . insulin glargine  20 Units Subcutaneous Daily  . lisinopril  10 mg Oral Daily  . sodium chloride flush  3 mL Intravenous Q12H   . DOPamine Stopped (10/30/16 1809)   OBJECTIVE  Vitals:   10/30/16 0630 10/30/16 0645 10/30/16 0730 10/30/16 0820  BP: (!) 91/57 117/70 (!) 93/54 126/62  Pulse: 98  99 (!) 102 (!) 101  Resp: (!) 24 (!) 24 17 (!) 23  Temp:    98.5 F (36.9 C)  TempSrc:    Oral  SpO2: 98% 100% 97% 100%  Weight:      Height:        Intake/Output Summary (Last 24 hours) at 10/30/16 0923 Last data filed at 10/30/16 0900  Gross per 24 hour  Intake           4186.7 ml  Output              550 ml  Net           3636.7 ml   Filed Weights   10/28/16 2359  Weight: 107 lb 9.4 oz (48.8 kg)    PHYSICAL EXAM  General: sitting in chair, aphasic, AAO x1  Neck: Supple without bruits or JVD. Lungs:  Resp regular and unlabored, CTA. Heart: RRR no s3, s4, or murmurs. Abdomen: Soft, non-tender, non-distended, BS + x 4.  Extremities: No clubbing, cyanosis or edema. DP/PT/Radials 2+ and equal bilaterally.  Accessory Clinical Findings  CBC  Recent Labs  10/28/16 1724 10/28/16 1750 10/30/16 0240  WBC 12.7*  --  9.8  NEUTROABS 11.4*  --   --   HGB 14.2 15.3* 12.3  HCT 42.2 45.0 37.9  MCV 66.1*  --  66.7*  PLT 225  --  867   Basic Metabolic Panel  Recent Labs  10/29/16 0025  10/29/16 1310 10/30/16  0240  NA 142  < > 136 138  K 3.3*  < > 2.9* 3.7  CL 115*  < > 109 111  CO2 19*  < > 21* 22  GLUCOSE 218*  < > 276* 295*  BUN <5*  < > <5* <5*  CREATININE 0.66  < > 0.55 0.56  CALCIUM 8.1*  < > 7.9* 8.7*  MG 1.6*  --   --  2.0  PHOS 2.1*  --   --   --   < > = values in this interval not displayed. Liver Function Tests  Recent Labs  10/28/16 1724 10/30/16 0240  AST 30 84*  ALT 24 45  ALKPHOS 82 81  BILITOT 1.1 0.8  PROT 8.1 6.3*  ALBUMIN 4.0 2.9*    Recent Labs  10/28/16 1724  LIPASE 20   Cardiac Enzymes  Recent Labs  10/29/16 0025 10/29/16 0646  TROPONINI 0.20* 0.18*   BNP Invalid input(s): POCBNP D-Dimer  Recent Labs  10/28/16 2024  DDIMER 7.54*    Recent Labs  10/30/16 0240  CHOL 146  HDL 44  LDLCALC 81  TRIG 104  CHOLHDL 3.3   Ct Angio Chest Pe W Or Wo Contrast  Result Date: 10/29/2016 CLINICAL DATA:  Altered mental  status yesterday. Nausea vomiting today. IMPRESSION: 1. Negative for acute pulmonary embolism. 2. Moderate cardiomegaly. 3. Mild interlobular septal thickening, consistent with interstitial fluid or thickening. No airspace consolidation. No pleural or pericardial effusion. Electronically Signed   By: Andreas Newport M.D.   On: 10/29/2016 00:28   Dg Abdomen Acute W/chest  Result Date: 10/28/2016 CLINICAL DATA:  Abdominal pain, nausea and vomiting.Marland Kitchen IMPRESSION: Unremarkable bowel gas pattern. No evidence of bowel obstruction or pneumoperitoneum. Mild interstitial prominence within both lungs -nonspecific. Electronically Signed   By: Margarette Canada M.D.   On: 10/28/2016 18:53   TELE: SR to ST  ECG: ST, 97 BPM, diffuse negative T waves in the anterolateral and inferior leads  TTE: 10/30/2016 - Left ventricle: Systolic function was mildly to moderately   reduced. The estimated ejection fraction was in the range of 40%   to 45%. Akinesis of the apicalanteroseptal myocardium. Features   are consistent with a pseudonormal left ventricular filling   pattern, with concomitant abnormal relaxation and increased   filling pressure (grade 2 diastolic dysfunction). There was a   large, apicalthrombus. - Mitral valve: There was mild regurgitation.  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    ASSESSMENT AND PLAN  1. Cardiomyopathy, Presumed to be Ischemic 2. Elevated Troponin 3. HTN 4. HLD 5. Prolonged QTc 6. DKA in the Setting of Type 2 DM 7. Acute Encephalopathy  The patient admitted with DKA, electrolyte imbalance, encephalopathy, minimal troponin elevation with flat trend. CTA negative for acute PE. Her EKG shows sinus tachycardia and diffuse ST depressions and negative T waves, these are very different from the prior EKG in 2015. Hypokalemia is persistent, continue aggressive replacement, hypomagnesemia resolved, she  continues to have significantly elevated blood glucose, persistent ECG changes - diffuse negative T waves in the anterolateral and inferior leads. Her troponin elevation is minimal. Her recheck echocardiogram shows LVEF improvement from previous 30-35% with inferior hypokinesis to 40-45% with apical akinesis and a large thrombus. We would recommend full anticoagulation with NOAC.  Considering she has a history of stroke with residual aphasia, she still a poor candidate for cardiac catheterization.  Signed, Ena Dawley MD, [facility] 10/30/2016

## 2016-10-30 NOTE — Evaluation (Signed)
Occupational Therapy Evaluation Patient Details Name: TNISHA MAUTHE MRN: [id number] DOB: [birth year] Today's Date: 10/30/2016    History of Present Illness LILLIA DESMET is a 60 y.o. female with a past medical history significant for NIDDM, HTN, isch CM with EF 35%, and hx of CVA who presents with 2 days confusion and vomiting.   Clinical Impression   Pt admitted with above. She demonstrates the below listed deficits and will benefit from continued OT to maximize safety and independence with BADLs.  Pt presents to OT with generalized weakness, decreased activity tolerance, decreased balance, decreased safety awareness.   She requires min guard assist for ADLs.  Per chart, pt is insisting on returning to her home alone.  She indicates that her sister assists her with finances, and medication management, and other family members transport her to grocery.  She is obviously not functioning well in her environment and has poor awareness of medical issues.   Feel safest plan is home with 24 hour assist.  Ultimately, she is likely most appropriate for ALF level of care.        Follow Up Recommendations  Home health OT;Supervision/Assistance - 24 hour [facility] )    Equipment Recommendations  None recommended by OT    Recommendations for Other Services       Precautions / Restrictions Precautions Precautions: Fall      Mobility Bed Mobility   Bed Mobility: Supine to Sit;Sit to Supine     Supine to sit: Supervision Sit to supine: Supervision      Transfers Overall transfer level: Needs assistance Equipment used: None Transfers: Sit to/from [facility] Sit to Stand: Min guard Stand pivot transfers: Min guard       General transfer comment: Pt unsteady.  requires min guard for safety     Balance Overall balance assessment: Needs assistance Sitting-balance support: Feet supported Sitting balance-Leahy Scale: Good     Standing balance support: No upper  extremity supported Standing balance-Leahy Scale: Fair                              ADL Overall ADL's : Needs assistance/impaired Eating/Feeding: Independent   Grooming: Wash/dry face;Oral care;Applying deodorant;Brushing hair;Min guard;Standing   Upper Body Bathing: Set up;Supervision/ safety;Sitting   Lower Body Bathing: Min guard;Sit to/from stand   Upper Body Dressing : Set up;Supervision/safety;Sitting   Lower Body Dressing: Minimal assistance;Sit to/from stand   Toilet Transfer: Min guard;Ambulation;Comfort height toilet;Grab bars   Toileting- Clothing Manipulation and Hygiene: Min guard;Sit to/from stand       Functional mobility during ADLs: Min guard General ADL Comments: Pt unsteady on her feet.  Requires assist for balance      Vision     Perception     Praxis      Pertinent Vitals/Pain Pain Assessment: No/denies pain     Hand Dominance Right   Extremity/Trunk Assessment Upper Extremity Assessment Upper Extremity Assessment: Generalized weakness   Lower Extremity Assessment Lower Extremity Assessment: Defer to PT evaluation   Cervical / Trunk Assessment Cervical / Trunk Assessment: Normal   Communication Communication Communication: No difficulties   Cognition Arousal/Alertness: Awake/alert Behavior During Therapy: Impulsive Overall Cognitive Status: No family/caregiver present to determine baseline cognitive functioning Area of Impairment: Safety/judgement;Awareness;Problem solving         Safety/Judgement: Decreased awareness of safety;Decreased awareness of deficits   Problem Solving: Slow processing;Requires verbal cues;Requires tactile cues General Comments: Pt with poor  insight into medical issuse which is likely her baseline.  She is slow to process information    General Comments       Exercises       Shoulder Instructions      Home Living Family/patient expects to be discharged to:: Private residence Living  Arrangements: Alone Available Help at Discharge: Family;Available PRN/intermittently Type of Home: House Home Access: Stairs to enter [employer] of Steps: 1   Home Layout: One level     Bathroom Shower/Tub: Teacher, early years/pre: Standard     Home Equipment: Shower seat   Additional Comments: Pt reports that her spouse doesn't live with her, but that he and family live close by and check on her daily       Prior Functioning/Environment Level of Independence: Independent;Needs assistance  Gait / Transfers Assistance Needed: independent  ADL's / Homemaking Assistance Needed: Family assists with grocery shopping, medication management and finances    Comments: doesn't drive         OT Problem List: Decreased strength;Decreased activity tolerance;Impaired balance (sitting and/or standing);Decreased cognition;Decreased safety awareness   OT Treatment/Interventions: Self-care/ADL training;DME and/or AE instruction;Therapeutic activities;Cognitive remediation/compensation;Patient/family education;Balance training    OT Goals(Current goals can be found in the care plan section) Acute Rehab OT Goals Patient Stated Goal: to go home  OT Goal Formulation: With patient Time For Goal Achievement: 11/13/16 Potential to Achieve Goals: Good ADL Goals Pt Will Perform Grooming: with modified independence;standing Pt Will Perform Upper Body Bathing: with modified independence;sitting Pt Will Perform Lower Body Bathing: with modified independence;sit to/from stand Pt Will Perform Upper Body Dressing: with modified independence;sitting Pt Will Perform Lower Body Dressing: with modified independence;sit to/from stand Pt Will Transfer to Toilet: with modified independence;ambulating;regular height toilet;bedside commode;grab bars Pt Will Perform Toileting - Clothing Manipulation and hygiene: with modified independence;sit to/from stand Pt Will Perform Tub/Shower  Transfer: Tub transfer;with supervision;ambulating;shower seat  OT Frequency: Min 2X/week   Barriers to D/C:            Co-evaluation              End of Session Equipment Utilized During Treatment: Gait belt Nurse Communication: Mobility status  Activity Tolerance: Patient tolerated treatment well Patient left: in bed;with call bell/phone within reach;with bed alarm set   Time: 1432-1445 OT Time Calculation (min): 13 min Charges:  OT General Charges $OT Visit: 1 Procedure OT Evaluation $OT Eval Moderate Complexity: 1 Procedure G-Codes:    Treysean Petruzzi M Nov 06, 2016, 3:18 PM

## 2016-10-30 NOTE — Progress Notes (Signed)
Spoke with patient. Husband was in the room and stated that patient had been eating a lot of candy, drinking sweet drinks, etc. Explained to them that pancreas has been working overtime. Question as to whether patient has been taking Metformin?  Sister getting her a PCP.  Just got HgbA1C back: 15.5%!!! Told patient that she may have to take insulin. She really does not want to. Question is whether she can remember to take it, home situation? Husband had left room at this time. Will continue to monitor blood sugars while in the hospital. Harvel Ricks RN BSN CDE

## 2016-10-30 NOTE — Clinical Social Work Note (Signed)
CSW met with patient. Husband at bedside. CSW introduced role and explained that PT recommendations would be discussed. Both patient and her husband are adamant that they do not want SNF. Patient confirmed that she lives alone and prefers it that way, so therefore there is no plan for any family to stay with her. Patient states that she has 7 other family members close by that can help as needed. CSW left a SNF list in case they changed their mind. RNCM notified. Due to medical team's concerns with patient discharging home alone, CSW will continue to follow progress in case patient and family determine they do need SNF.  Dayton Scrape, [place]

## 2016-10-31 DIAGNOSIS — R7989 Other specified abnormal findings of blood chemistry: Secondary | ICD-10-CM

## 2016-10-31 DIAGNOSIS — R778 Other specified abnormalities of plasma proteins: Secondary | ICD-10-CM | POA: Diagnosis present

## 2016-10-31 DIAGNOSIS — I428 Other cardiomyopathies: Secondary | ICD-10-CM | POA: Diagnosis present

## 2016-10-31 DIAGNOSIS — I236 Thrombosis of atrium, auricular appendage, and ventricle as current complications following acute myocardial infarction: Secondary | ICD-10-CM

## 2016-10-31 DIAGNOSIS — I2129 ST elevation (STEMI) myocardial infarction involving other sites: Secondary | ICD-10-CM

## 2016-10-31 LAB — BASIC METABOLIC PANEL
ANION GAP: 6 (ref 5–15)
BUN: 6 mg/dL (ref 6–20)
CALCIUM: 8.7 mg/dL — AB (ref 8.9–10.3)
CO2: 25 mmol/L (ref 22–32)
Chloride: 105 mmol/L (ref 101–111)
Creatinine, Ser: 0.48 mg/dL (ref 0.44–1.00)
Glucose, Bld: 143 mg/dL — ABNORMAL HIGH (ref 65–99)
POTASSIUM: 2.9 mmol/L — AB (ref 3.5–5.1)
Sodium: 136 mmol/L (ref 135–145)

## 2016-10-31 LAB — GLUCOSE, CAPILLARY
GLUCOSE-CAPILLARY: 190 mg/dL — AB (ref 65–99)
Glucose-Capillary: 199 mg/dL — ABNORMAL HIGH (ref 65–99)
Glucose-Capillary: 236 mg/dL — ABNORMAL HIGH (ref 65–99)
Glucose-Capillary: 224 mg/dL — ABNORMAL HIGH (ref 65–99)

## 2016-10-31 LAB — MAGNESIUM: Magnesium: 1.8 mg/dL (ref 1.7–2.4)

## 2016-10-31 MED ORDER — APIXABAN 5 MG PO TABS
5.0000 mg | ORAL_TABLET | Freq: Two times a day (BID) | ORAL | Status: DC
  Administered 2016-10-31 – 2016-11-01 (×2): 5 mg via ORAL
  Filled 2016-10-31 (×2): qty 1

## 2016-10-31 MED ORDER — INSULIN STARTER KIT- PEN NEEDLES (ENGLISH)
1.0000 | Freq: Once | Status: AC
  Administered 2016-10-31: 1
  Filled 2016-10-31: qty 1

## 2016-10-31 MED ORDER — APIXABAN 5 MG PO TABS: 5.0000 mg | ORAL_TABLET | Freq: Two times a day (BID) | ORAL | Status: DC

## 2016-10-31 MED ORDER — POTASSIUM CHLORIDE CRYS ER 20 MEQ PO TBCR: 40.0000 meq | EXTENDED_RELEASE_TABLET | Freq: Three times a day (TID) | ORAL | Status: DC

## 2016-10-31 MED ORDER — POTASSIUM CHLORIDE CRYS ER 20 MEQ PO TBCR
40.0000 meq | EXTENDED_RELEASE_TABLET | ORAL | Status: AC
  Administered 2016-10-31 (×2): 40 meq via ORAL
  Filled 2016-10-31 (×2): qty 2

## 2016-10-31 NOTE — Progress Notes (Signed)
PROGRESS NOTE    Kristen Lozano  MCN:[id number] DOB: 03/02/1956 DOA: 10/28/2016 PCP: No primary care provider on file.     Brief Narrative:  Kristen Lozano is a 60 y.o. female with a past medical history significant for NIDDM, HTN, isch CM with EF 35%, and hx of CVA who presents with 2 days confusion and vomiting. She was admitted due to DKA, elevated troponin.   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 Suspect secondary to noncompliance of medications, lack of follow-up, excess sugar intake. Improved on glucostabilizer and now on Lantus, mealtime novolog and SSI  -Hemoglobin A1c 15.5. Will need insulin at discharge.   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 Troponin 0.27 upon admission, currently trending downward to 0.18. Continue to monitor. Cardiology consulted and appreciated  Hypokalemia -Replace today -Trend  Acute encephalopathy -Possibly residual from her previous CVA. (As per husband) -Possibly due to DKA. Workup currently unremarkable for infection  -Continue to monitor closely, now back to baseline   Essential  hypertension -Continue Coreg, lisinopril, monitor  Prolonged QT -Avoid QT prolonging medications  Anxiety -Continue Ativan as needed  History of CVA -Resume aspirin, lipitor   Medical noncompliance -Appreciate SW/CM    DVT prophylaxis: lovenox Code Status: Full Family Communication: no family at bedside  Disposition Plan: pending further improvement and work up. Transfer out of step down today. Patient and family refusing SNF which has been recommended multiple times    Consultants:   Cardiology  Procedures:   None  Antimicrobials:   None    Subjective: Patient has no complaints today. Denies Cp, SOB, N/V. States she is fine and ready to go. I have tried to counsel her on the importance of insulin 4 times daily as well as very frequent blood sugar checks, heart medications, and close follow up with physicians. She continues to refuse SNF and states she will be fine at home.    Objective: Vitals:   10/31/16 0200 10/31/16 0300 10/31/16 0400 10/31/16 0500  BP: 104/69 109/74 110/71   Pulse: 80 88 89   Resp: 15 13 17    Temp:    98.3 F (36.8 C)  TempSrc:      SpO2: 98% 99% 98%   Weight:      Height:        Intake/Output Summary (Last 24 hours) at 10/31/16 0723 Last data filed at 10/31/16 0600  Gross per 24 hour  Intake          1041.29 ml  Output             1450 ml  Net          -408.71 ml   [employer]  10/28/16 2359  Weight: 48.8 kg (107 lb 9.4 oz)    Examination:  General exam: Appears calm and comfortable  Respiratory system: Clear to auscultation. Respiratory effort normal. Cardiovascular system: S1 & S2 heard, tachycardic, regular rhythm. No JVD, murmurs, rubs, gallops or clicks. No pedal edema. Gastrointestinal system: Abdomen is nondistended, soft and nontender. No organomegaly or masses felt. Normal bowel sounds heard. Central nervous system: Alert and oriented. No focal neurological deficits. Extremities: Symmetric 5 x 5 power. Skin:  No rashes, lesions or ulcers Psych: Poor insight   Data Reviewed: I have personally reviewed following labs and imaging studies  CBC:  Recent Labs Lab 10/28/16 1647 10/28/16 1724 10/28/16 1750 10/30/16 0240  WBC 11.6* 12.7*  --  9.8  NEUTROABS  --  11.4*  --   --   HGB 13.1 14.2 15.3* 12.3  HCT 40.1 42.2 45.0 37.9  MCV 66.5* 66.1*  --  66.7*  PLT 216 225  --  428   Basic Metabolic Panel:  Recent Labs Lab 10/29/16 0025 10/29/16 0415 10/29/16 0646 10/29/16 1310 10/30/16 0240 10/31/16 0420  NA 142 143 141 136 138 136  K 3.3* 3.1* 2.7* 2.9* 3.7 2.9*  CL 115* 115* 113* 109 111 105  CO2 19* 22 20* 21* 22 25  GLUCOSE 218* 195* 128* 276* 295* 143*  BUN <5* 5* <5* <5* <5* 6  CREATININE 0.66 0.56 0.53 0.55 0.56 0.48  CALCIUM 8.1* 8.1* 8.2* 7.9* 8.7* 8.7*  MG 1.6*  --   --   --  2.0 1.8  PHOS 2.1*  --   --   --   --   --    GFR: Estimated Creatinine Clearance: 56.9 mL/min (by C-G formula based on SCr of 0.48 mg/dL). Liver Function Tests:  Recent Labs Lab 10/28/16 1724 10/30/16 0240  AST 30 84*  ALT 24 45  ALKPHOS 82 81  BILITOT 1.1 0.8  PROT 8.1 6.3*  ALBUMIN 4.0 2.9*    Recent Labs Lab 10/28/16 1724  LIPASE 20   No results for input(s): AMMONIA in the last 168 hours. Coagulation Profile: No results for input(s): INR, PROTIME in the last 168 hours. Cardiac Enzymes:  Recent Labs Lab 10/29/16 0025 10/29/16 0646  TROPONINI 0.20* 0.18*   BNP (last 3 results) No results for input(s): PROBNP in the last 8760 hours. HbA1C:  Recent Labs  10/29/16 0540  HGBA1C >15.5*   CBG:  Recent Labs Lab 10/30/16 0813 10/30/16 1213 10/30/16 1650 10/30/16 1942 10/30/16 2110  GLUCAP 294* 241* 148* 210* 245*   Lipid Profile:  Recent Labs  10/30/16 0240  CHOL 146  HDL 44  LDLCALC 81  TRIG 104  CHOLHDL 3.3   Thyroid Function Tests: No results for input(s): TSH, T4TOTAL, FREET4, T3FREE, THYROIDAB in the last 72 hours. Anemia Panel: No results for  input(s): VITAMINB12, FOLATE, FERRITIN, TIBC, IRON, RETICCTPCT in the last 72 hours. Sepsis Labs: No results for input(s): PROCALCITON, LATICACIDVEN in the last 168 hours.  Recent Results (from the past 240 hour(s))  MRSA PCR Screening     Status: None   Collection Time: 10/29/16 12:34 AM  Result Value Ref Range Status   MRSA by PCR NEGATIVE NEGATIVE Final    Comment:        The GeneXpert MRSA Assay (FDA approved for NASAL specimens only), is one component of a comprehensive MRSA colonization surveillance program. It is not intended to diagnose MRSA infection nor to guide or monitor treatment for MRSA infections.  Radiology Studies: No results found.    Scheduled Meds: . aspirin  325 mg Oral Daily  . atorvastatin  20 mg Oral q1800  . carvedilol  25 mg Oral BID WC  . enoxaparin (LOVENOX) injection  40 mg Subcutaneous Q24H  . insulin aspart  0-5 Units Subcutaneous QHS  . insulin aspart  0-9 Units Subcutaneous TID WC  . insulin aspart  5 Units Subcutaneous TID WC  . insulin glargine  20 Units Subcutaneous Daily  . lisinopril  10 mg Oral Daily  . potassium chloride  40 mEq Oral TID  . sodium chloride flush  3 mL Intravenous Q12H   Continuous Infusions: . DOPamine Stopped (10/30/16 1809)     LOS: 3 days    Time spent: 30 minutes   Dessa Phi, DO [facility] [email or web address] Password [email or web address] 10/31/2016, 7:23 AM

## 2016-10-31 NOTE — Progress Notes (Addendum)
Inpatient Diabetes Program Recommendations  AACE/ADA: New Consensus Statement on Inpatient Glycemic Control (2015)  Target Ranges:  Prepandial:   less than 140 mg/dL      Peak postprandial:   less than 180 mg/dL (1-2 hours)      Critically ill patients:  140 - 180 mg/dL   Lab Results  Component Value Date   GLUCAP 245 (H) 10/30/2016   HGBA1C >15.5 (H) 10/29/2016    Review of Glycemic ControlResults for KERA, DEACON (MRN [id number]) as of 10/31/2016 08:51  Ref. Range 10/31/2016 04:20  Glucose Latest Ref Range: 65 - 99 mg/dL 143 (H)   Current orders for Inpatient glycemic control: Novolog sensitive tid with meals and HS, Lantus 20 units daily, Novolog 5 units tid with meals  Inpatient Diabetes Program Recommendations:    Blood sugars improved with insulin.  Based on A1C, patient will need insulin at discharge in order to maintain glycemic control.  Discharge plan will need to include insulin teaching and possible support for patient for insulin administration.  Will order insulin teaching kit and instruction. Consider insulin pen at discharge for convenience. Will follow.  Thanks, Adah Perl, RN, BC-ADM Inpatient Diabetes Coordinator Pager [phone] (8a-5p)

## 2016-10-31 NOTE — Care Management Note (Addendum)
Case Management Note  Patient Details  Name: Kristen Lozano MRN: [id number] Date of Birth: [birth year]  Subjective/Objective:    Patient lives alone, she states she does not want to go to snf, she does not want to take insulin, she wants to keep taking the pill.  She states she will try to do better with her diet.  Patient states she does not feel comfortable with giving her self insulin, sounds like she will not be compliant with that.  NCM tried to contact Diabetes Educator yesterday ,will try again today.  Patient chose [facility] for [facility], PT, OT, and aide.  She does not want a Education officer, museum.  She states she will need BSC.  Patient also states her ex husband Kristen Lozano , her daugter Kristen Lozano , and her sister Kristen Lozano will be there with her , each at different times.  Referral made to [facility] with [facility], Soc will begin 24-48 hrs post dc.  NCM trying to get apt for patient at [place] at [facility]  Waiting for call back.    11/01/16 [place], BSN- NCM called patient at home, have not heard back from [place].  NCM assisted patient in getting apt with [place], [address], phone [phone].  Her apt is on Dec 19 at 2pm to see Dr. Latanya Presser.   This information was given to patient's sister, Kristen Lozano ,she states she will be taking patient to her MD apts.           Action/Plan:   Expected Discharge Date:                  Expected Discharge Plan:  Skilled Nursing Facility  In-House Referral:  Clinical Social Work  Discharge planning Services  CM Consult  Post Acute Care Choice:  Durable Medical Equipment, Home Health Choice offered to:  Patient  DME Arranged:  Bedside commode DME Agency:  [place]:  RN, PT, OT, Nurse's Aide [place] Agency:  [place]  Status of Service:  Completed, signed off  If discussed at [place] of Stay Meetings, dates discussed:    Additional  Comments:  Zenon Mayo, RN 10/31/2016, 1:07 PM

## 2016-10-31 NOTE — Progress Notes (Signed)
Attempted to call report x2 to 3 [place]. RN could not take report.

## 2016-10-31 NOTE — Progress Notes (Addendum)
ANTICOAGULATION CONSULT NOTE - Initial Consult  Pharmacy Consult for Eliquis Indication: LV thrombus   No Known Allergies  Patient Measurements: Height: 5\' 3"  (160 cm) Weight: 107 lb 9.4 oz (48.8 kg) IBW/kg (Calculated) : 52.4  Vital Signs: Temp: 98.1 F (36.7 C) (12/07 1433) Temp Source: Oral (12/07 1433) BP: 111/68 (12/07 1640) Pulse Rate: 94 (12/07 1640)  Labs:  Recent Labs  10/28/16 1750 10/29/16 0025  10/29/16 0646 10/29/16 1310 10/30/16 0240 10/31/16 0420  HGB 15.3*  --   --   --   --  12.3  --   HCT 45.0  --   --   --   --  37.9  --   PLT  --   --   --   --   --  183  --   CREATININE 0.50 0.66  < > 0.53 0.55 0.56 0.48  TROPONINI  --  0.20*  --  0.18*  --   --   --   < > = values in this interval not displayed.  Estimated Creatinine Clearance: 56.9 mL/min (by C-G formula based on SCr of 0.48 mg/dL).   Assessment: 52 YOF with hx of DM, CHF, CVA presented with DKA, and elevated troponin. Echo done yesterday and found to have a LV apical mass concern for LV thrombus. Cardiology recommended to start Eliquis for anticoagulation. LV thrombus is not a labeled indication for Eliquis, but there has been multiple case reports supporting the use of DOACs for LV thrombus. Scr 0.48, est. crcl ~ 55-60 ml/min. CBC wnl. LFTs 84/45. Noted pt was on lovenox 40 for VTE prophylaxis. Last dose at 1300. Pt is also on full dose aspirin for stroke phylaxis.  Goal of Therapy:   Monitor platelets by anticoagulation protocol: Yes   Plan:  Eliquis 5 mg po BID (dose per case report studies International Journal of [employer] 2 (2016) 57-58)  D/C lovenox, first dose Eliquis tonight at 2200 Monitor CBC Consider changing aspirin to 81 mg?  Maryanna Shape, PharmD, BCPS  Clinical Pharmacist  Pager: [phone]   10/31/2016,5:41 PM

## 2016-11-01 DIAGNOSIS — E119 Type 2 diabetes mellitus without complications: Secondary | ICD-10-CM | POA: Diagnosis present

## 2016-11-01 DIAGNOSIS — E1110 Type 2 diabetes mellitus with ketoacidosis without coma: Secondary | ICD-10-CM | POA: Diagnosis present

## 2016-11-01 LAB — BASIC METABOLIC PANEL
ANION GAP: 7 (ref 5–15)
BUN: 7 mg/dL (ref 6–20)
CO2: 24 mmol/L (ref 22–32)
Calcium: 8.9 mg/dL (ref 8.9–10.3)
Chloride: 105 mmol/L (ref 101–111)
Creatinine, Ser: 0.54 mg/dL (ref 0.44–1.00)
GFR calc Af Amer: >60 mL/min (ref >60)
GFR calc non Af Amer: >60 mL/min (ref >60)
GLUCOSE: 186 mg/dL — AB (ref 65–99)
POTASSIUM: 4 mmol/L (ref 3.5–5.1)
Sodium: 136 mmol/L (ref 135–145)

## 2016-11-01 LAB — GLUCOSE, CAPILLARY
GLUCOSE-CAPILLARY: 222 mg/dL — AB (ref 65–99)
Glucose-Capillary: 217 mg/dL — ABNORMAL HIGH (ref 65–99)

## 2016-11-01 MED ORDER — SITAGLIPTIN PHOSPHATE 100 MG PO TABS: 100.0000 mg | ORAL_TABLET | Freq: Every day | ORAL | 0 refills | Status: DC

## 2016-11-01 MED ORDER — METFORMIN HCL 1000 MG PO TABS: 1000.0000 mg | ORAL_TABLET | Freq: Two times a day (BID) | ORAL | 0 refills | Status: DC

## 2016-11-01 MED ORDER — ASPIRIN 81 MG PO CHEW
81.0000 mg | CHEWABLE_TABLET | Freq: Every day | ORAL | Status: DC
  Administered 2016-11-01: 81 mg via ORAL
  Filled 2016-11-01: qty 1

## 2016-11-01 MED ORDER — LISINOPRIL 10 MG PO TABS: 10.0000 mg | ORAL_TABLET | Freq: Every day | ORAL | 0 refills | Status: DC

## 2016-11-01 MED ORDER — ASPIRIN 81 MG PO TABS: 81.0000 mg | ORAL_TABLET | Freq: Every day | ORAL | 0 refills | Status: DC

## 2016-11-01 MED ORDER — LINAGLIPTIN 5 MG PO TABS: 5.0000 mg | ORAL_TABLET | Freq: Every day | ORAL | 0 refills | Status: DC

## 2016-11-01 MED ORDER — DABIGATRAN ETEXILATE MESYLATE 150 MG PO CAPS: 150.0000 mg | ORAL_CAPSULE | Freq: Two times a day (BID) | ORAL | 0 refills | Status: DC

## 2016-11-01 MED ORDER — APIXABAN 5 MG PO TABS: 5.0000 mg | ORAL_TABLET | Freq: Two times a day (BID) | ORAL | 0 refills | Status: DC

## 2016-11-01 MED ORDER — ATORVASTATIN CALCIUM 20 MG PO TABS: 20.0000 mg | ORAL_TABLET | Freq: Every day | ORAL | 0 refills | Status: DC

## 2016-11-01 MED ORDER — CARVEDILOL 25 MG PO TABS: 25.0000 mg | ORAL_TABLET | Freq: Two times a day (BID) | ORAL | 0 refills | Status: DC

## 2016-11-01 NOTE — Discharge Summary (Addendum)
Physician Discharge Summary  Kristen Lozano WUJ:[id number] DOB: [birth year] DOA: 10/28/2016  PCP: No primary care provider on file. Set up with [facility].   Admit date: 10/28/2016 Discharge date: 11/01/2016  Admitted From: Home Disposition:  Home. Refused SNF recommendation.   Recommendations for Outpatient Follow-up:  1. Follow up with PCP in 1 week  2. Follow up with cardiology in 1 week  3. Please obtain CBC, BMP 1 week  4. Check your blood sugars 4 times daily   Home Health: HHRN,PT,OT,aid  Equipment/Devices: None   Discharge Condition: Stable CODE STATUS: Full  Diet recommendation: Carb modified, heart healthy   Brief/Interim Summary: From H&P: Kristen Lozano is a 60 y.o. female with a past medical history significant for NIDDM, HTN, isch CM with EF 35%, and hx of CVA who presents with 2 days confusion and vomiting. The patient was in her usual state of health (disabled, but independent, uses a cane "sometimes", lives with daughter, takes the bus to the store alone regularly) until yesterday. Husband noticed that she was confused, talking about "getting something fixed" and he couldn't understand what she was talking about, but made nothing of it and there were no other accompanying symptoms (fever, cough, urinary symptoms, LOC, focal weakness, slurred speech).  This morning, she seemed back to her normal self, but around mid-day she was vomiting and complained of "stomach ache."  He went to work, but she called him from home saying "I can't stop vomiting" and "Something is wrong", so EMS were called.   She was admitted for DKA. Blood sugars improved on glucose stabilizer, which was transitioned to subcutaneous insulin. She is evaluated by diabetic coordinator as well. Her hemoglobin A1c is 15.5. Due to elevated troponin as well as ischemic cardiomyopathy, cardiology was consulted. Repeat echocardiogram revealed EF 95-62%, grade 2 diastolic dysfunction and large mass in  apex of LV concerning for apical mural thrombus. She was started on anticoagulation. She was evaluated by physical therapy and was recommended for skilled nursing placement. Patient, sister, husband all refused skilled nursing facility placement recommendation multiple times. Patient also voiced concerns in regards to insulin. Unfortunately, patient remains high risk for decompensation and readmission as she is highly noncompliant and does not appear to have good insight into her medical comorbidities. While she requires insulin, it is deemed unsafe for her to be discharged with insulin due to her lack of ownership and insight into treating her uncontrolled DM. She will be discharged with oral agents to avoid potential hypoglycemic episodes as outpatient. She should follow up with PCP and cardiology closely.   Day of discharge: No complaints today. Denies any fevers, chest pain, shortness of breath. Continues to refuse SNF placement.   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 Suspect secondary to noncompliance of medications, lack of follow-up, excess sugar intake. Improved on glucostabilizer and now on Lantus, mealtime novolog and SSI. Hemoglobin A1c 15.5. Patient voiced concern with insulin and concern for compliance as well as possible hypoglycemia. Will discharge  with oral agents (metformin and januvia) and close follow up with PCP.   Chronic combined systolic and diastolic CHF Ischemic cardiomyopathy -Echo in  2015 showed EF 30-35% and declined heart cath -Echo 10/30/2016 showed EF 38-45%, grade 2 diastolic dysfunction -Coreg, lisinopril  -Appreciate cardiology   LV apical mass, concern for apical mural thrombus -Eliquis started by cardiology. Patient also had previous embolic stroke in the past. Switched to pradaxa as eliquis not covered by insurance.   Elevated troponin -Likely secondary to demand ischemia. Troponin 0.27 upon admission, currently trending downward to 0.18. Continue to monitor. Cardiology consulted and appreciated  Acute encephalopathy -Possibly residual from her previous CVA. (As per husband) -Possibly due to DKA. Workup currently unremarkable for infection  -Continue to monitor closely, now back to baseline   Essential hypertension -Continue Coreg, lisinopril, monitor  Prolonged QT -Avoid QT prolonging medications  Anxiety -Continue Ativan as needed  History of CVA -Resume aspirin, lipitor   Medical noncompliance -Appreciate SW/CM   Discharge Instructions  Discharge Instructions    Diet - low sodium heart healthy    Complete by:  As directed    Discharge instructions    Complete by:  As directed    Recommendations for Outpatient Follow-up:  Follow up with PCP in 1 week  Follow up with cardiology in 1 week  Please obtain BMP 1 week  Check your blood sugars 4 times daily  Take your medications as prescribed   For home use only DME Glucometer    Complete by:  As directed    Increase activity slowly    Complete by:  As directed        Medication List    TAKE these medications   apixaban 5 MG Tabs tablet Commonly known as:  ELIQUIS Take 1 tablet (5 mg total) by mouth 2 (two) times daily.   aspirin 81 MG tablet Take 1 tablet (81 mg total) by mouth daily. What changed:  medication strength  how much to take   atorvastatin 20 MG tablet Commonly known as:  LIPITOR Take 1 tablet (20 mg total) by mouth daily at 6 PM. What changed:   additional instructions   carvedilol 25 MG tablet Commonly known as:  COREG Take 1 tablet (25 mg total) by mouth 2 (two) times daily with a meal. What changed:  medication strength  how much to take   linagliptin 5 MG Tabs tablet Commonly known as:  TRADJENTA Take 1 tablet (5 mg total) by mouth daily.   lisinopril 10 MG tablet Commonly known as:  PRINIVIL,ZESTRIL Take 1 tablet (10 mg total) by mouth daily.   metFORMIN 1000 MG tablet Commonly known as:  GLUCOPHAGE Take 1 tablet (1,000 mg total) by mouth 2 (two) times daily with a meal.            Durable Medical Equipment        Start     Ordered   11/01/16 0000  For home use only DME Glucometer     11/01/16 1220     Follow-up Information    [facility] Follow up.   Why:  call to make follow up apt on Monday 12/11  Contact information: [place] [address] [phone]       Ena Dawley, MD. Schedule an appointment as soon as possible for a visit in 1 week(s).   Specialty:  Cardiology Contact information: [address] [place] [address] [phone]  [facility] Follow up.   Why:  They will do your home health care at your home Contact information: [address] [place] [address] [phone]          No Known Allergies  Consultations:  Cardiology    Procedures/Studies: Ct Angio Chest Pe W Or Wo Contrast  Result Date: 10/29/2016 CLINICAL DATA:  Altered mental status yesterday. Nausea vomiting today. EXAM: CT ANGIOGRAPHY CHEST WITH CONTRAST TECHNIQUE: Multidetector CT imaging of the chest was performed using the standard protocol during bolus administration of intravenous contrast. Multiplanar CT image reconstructions and MIPs were obtained to evaluate the vascular anatomy. CONTRAST:  75 mL Isovue 370 intravenous COMPARISON:  Radiographs 10/28/2016 FINDINGS:  Cardiovascular: Satisfactory opacification of the pulmonary arteries to the segmental level. No evidence of pulmonary embolism. Moderate cardiomegaly. No pericardial effusion. Mediastinum/Nodes: No enlarged mediastinal, hilar, or axillary lymph nodes. Thyroid gland, trachea, and esophagus demonstrate no significant findings. Lungs/Pleura: No airspace consolidation. There is mild interlobular septal thickening which could represent a degree of interstitial fluid. No pleural effusions. Airways are patent and normal in caliber. Upper Abdomen: No acute findings. Musculoskeletal: No significant abnormality. Review of the MIP images confirms the above findings. IMPRESSION: 1. Negative for acute pulmonary embolism. 2. Moderate cardiomegaly. 3. Mild interlobular septal thickening, consistent with interstitial fluid or thickening. No airspace consolidation. No pleural or pericardial effusion. Electronically Signed   By: Andreas Newport M.D.   On: 10/29/2016 00:28   Dg Abdomen Acute W/chest  Result Date: 10/28/2016 CLINICAL DATA:  Abdominal pain, nausea and vomiting. EXAM: DG ABDOMEN ACUTE W/ 1V CHEST COMPARISON:  09/04/2014 and prior exams FINDINGS: The cardiomediastinal silhouette is unremarkable. Interstitial prominence noted. There is no evidence of airspace disease, pleural effusion, mass or pneumothorax. The bowel gas pattern is unremarkable. No dilated bowel loops are identified. There is no evidence of bowel obstruction or pneumoperitoneum. No suspicious calcifications are present. No acute bony abnormalities are identified. IMPRESSION: Unremarkable bowel gas pattern. No evidence of bowel obstruction or pneumoperitoneum. Mild interstitial prominence within both lungs -nonspecific. Electronically Signed   By: Margarette Canada M.D.   On: 10/28/2016 18:53    Echo 12/6 Study Conclusions  - Left ventricle: Systolic function was mildly to moderately   reduced. The estimated ejection fraction was in the range of  40%   to 45%. Akinesis of the apicalanteroseptal myocardium. Features   are consistent with a pseudonormal left ventricular filling   pattern, with concomitant abnormal relaxation and increased   filling pressure (grade 2 diastolic dysfunction). There was a   large, apicalthrombus. - Mitral valve: There was mild regurgitation.  Impressions: - There is a large mass in the apex of the LV that is concerning for apical mural thrombus.    Discharge Exam: Vitals:   11/01/16 0928 11/01/16 1124  BP: 140/78 (!) 143/74  Pulse:  96  Resp:  18  Temp:  98 F (36.7 C)   Vitals:   11/01/16 0507 11/01/16 0845 11/01/16 0928 11/01/16 1124  BP: 104/63 99/85 140/78 (!) 143/74  Pulse: 83 (!) 101  96  Resp: 20 20  18   Temp: 98.2 F (36.8 C) 98.4 F (36.9 C)  98 F (36.7 C)  TempSrc: Oral Oral  Oral  SpO2: 100% 98%  98%  Weight: 48.3 kg (106 lb 6.4 oz)     Height:       General exam: Appears calm and comfortable  Respiratory system: Clear to auscultation. Respiratory effort normal. Cardiovascular system: S1 &  S2 heard, tachycardic, regular rhythm. No JVD, murmurs, rubs, gallops or clicks. No pedal edema. Gastrointestinal system: Abdomen is nondistended, soft and nontender. No organomegaly or masses felt. Normal bowel sounds heard. Central nervous system: Alert and oriented. No focal neurological deficits. Extremities: Symmetric 5 x 5 power. Skin: No rashes, lesions or ulcers Psych: Poor insight     The results of significant diagnostics from this hospitalization (including imaging, microbiology, ancillary and laboratory) are listed below for reference.     Microbiology: Recent Results (from the past 240 hour(s))  MRSA PCR Screening     Status: None   Collection Time: 10/29/16 12:34 AM  Result Value Ref Range Status   MRSA by PCR NEGATIVE NEGATIVE Final    Comment:        The GeneXpert MRSA Assay (FDA approved for NASAL specimens only), is one component of a comprehensive MRSA  colonization surveillance program. It is not intended to diagnose MRSA infection nor to guide or monitor treatment for MRSA infections.      Labs: BNP (last 3 results) No results for input(s): BNP in the last 8760 hours. Basic Metabolic Panel:  Recent Labs Lab 10/29/16 0025  10/29/16 0646 10/29/16 1310 10/30/16 0240 10/31/16 0420 11/01/16 0239  NA 142  < > 141 136 138 136 136  K 3.3*  < > 2.7* 2.9* 3.7 2.9* 4.0  CL 115*  < > 113* 109 111 105 105  CO2 19*  < > 20* 21* 22 25 24   GLUCOSE 218*  < > 128* 276* 295* 143* 186*  BUN <5*  < > <5* <5* <5* 6 7  CREATININE 0.66  < > 0.53 0.55 0.56 0.48 0.54  CALCIUM 8.1*  < > 8.2* 7.9* 8.7* 8.7* 8.9  MG 1.6*  --   --   --  2.0 1.8  --   PHOS 2.1*  --   --   --   --   --   --   < > = values in this interval not displayed. Liver Function Tests:  Recent Labs Lab 10/28/16 1724 10/30/16 0240  AST 30 84*  ALT 24 45  ALKPHOS 82 81  BILITOT 1.1 0.8  PROT 8.1 6.3*  ALBUMIN 4.0 2.9*    Recent Labs Lab 10/28/16 1724  LIPASE 20   No results for input(s): AMMONIA in the last 168 hours. CBC:  Recent Labs Lab 10/28/16 1647 10/28/16 1724 10/28/16 1750 10/30/16 0240  WBC 11.6* 12.7*  --  9.8  NEUTROABS  --  11.4*  --   --   HGB 13.1 14.2 15.3* 12.3  HCT 40.1 42.2 45.0 37.9  MCV 66.5* 66.1*  --  66.7*  PLT 216 225  --  183   Cardiac Enzymes:  Recent Labs Lab 10/29/16 0025 10/29/16 0646  TROPONINI 0.20* 0.18*   BNP: Invalid input(s): POCBNP CBG:  Recent Labs Lab 10/31/16 1204 10/31/16 1723 10/31/16 2104 11/01/16 0631 11/01/16 1124  GLUCAP 199* 236* 224* 222* 217*   D-Dimer No results for input(s): DDIMER in the last 72 hours. Hgb A1c No results for input(s): HGBA1C in the last 72 hours. Lipid Profile  Recent Labs  10/30/16 0240  CHOL 146  HDL 44  LDLCALC 81  TRIG 104  CHOLHDL 3.3   Thyroid function studies No results for input(s): TSH, T4TOTAL, T3FREE, THYROIDAB in the last 72 hours.  Invalid  input(s): FREET3 Anemia work up No results for input(s): VITAMINB12, FOLATE, FERRITIN, TIBC, IRON, RETICCTPCT in the last 72 hours.  Urinalysis    Component Value Date/Time   COLORURINE YELLOW 10/28/2016 [place] 10/28/2016 1647   LABSPEC 1.027 10/28/2016 1647   PHURINE 5.5 10/28/2016 1647   GLUCOSEU >1000 (A) 10/28/2016 1647   HGBUR NEGATIVE 10/28/2016 1647   BILIRUBINUR NEGATIVE 10/28/2016 1647   KETONESUR 40 (A) 10/28/2016 1647   PROTEINUR NEGATIVE 10/28/2016 1647   UROBILINOGEN 1.0 09/07/2014 1405   NITRITE NEGATIVE 10/28/2016 1647   LEUKOCYTESUR NEGATIVE 10/28/2016 1647   Sepsis Labs Invalid input(s): PROCALCITONIN,  WBC,  LACTICIDVEN Microbiology Recent Results (from the past 240 hour(s))  MRSA PCR Screening     Status: None   Collection Time: 10/29/16 12:34 AM  Result Value Ref Range Status   MRSA by PCR NEGATIVE NEGATIVE Final    Comment:        The GeneXpert MRSA Assay (FDA approved for NASAL specimens only), is one component of a comprehensive MRSA colonization surveillance program. It is not intended to diagnose MRSA infection nor to guide or monitor treatment for MRSA infections.      Time coordinating discharge: Over 30 minutes  SIGNED:  Dessa Phi, DO [facility] Pager [phone]  If 7PM-7AM, please contact night-coverage [email or web address] Password [facility] 11/01/2016, 12:23 PM

## 2016-11-01 NOTE — Progress Notes (Signed)
Physical Therapy Treatment Patient Details Name: Kristen Lozano MRN: [id number] DOB: June 18, 1956 Today's Date: 11/01/2016    History of Present Illness Kristen Lozano is a 60 y.o. female with a past medical history significant for NIDDM, HTN, isch CM with EF 35%, and hx of CVA who presents with 2 days confusion and vomiting.    PT Comments    Pt is hopeful for d/c home today. D/C recommendation updated to HHPT with 24-hour family assist.   Follow Up Recommendations  Home health PT;Supervision/Assistance - 24 hour     Equipment Recommendations  None recommended by PT    Recommendations for Other Services       Precautions / Restrictions Precautions Precautions: Fall;Other (comment) Precaution Comments: lower education/comprehension deficits    Mobility  Bed Mobility         Supine to sit: Independent Sit to supine: Independent      Transfers   Equipment used: None   Sit to Stand: Supervision Stand pivot transfers: Supervision       General transfer comment: Pt steady with no physical assist needed. Pt reports she has been ambulating to/from the bathroom alone.  Ambulation/Gait Ambulation/Gait assistance: Supervision Ambulation Distance (Feet): 350 Feet Assistive device: None Gait Pattern/deviations: Step-through pattern;Narrow base of support;Decreased stride length Gait velocity: decreased Gait velocity interpretation: Below normal speed for age/gender     Stairs            Wheelchair Mobility    Modified Rankin (Stroke Patients Only)       Balance   Sitting-balance support: No upper extremity supported;Feet supported Sitting balance-Leahy Scale: Normal     Standing balance support: During functional activity;No upper extremity supported Standing balance-Leahy Scale: Good                      Cognition Arousal/Alertness: Awake/alert Behavior During Therapy: WFL for tasks assessed/performed Overall Cognitive Status: History  of cognitive impairments - at baseline                 General Comments: Pt with poor insight into medical issuse which is likely her baseline.  She is slow to process information     Exercises      General Comments        Pertinent Vitals/Pain Pain Assessment: No/denies pain    Home Living                      Prior Function            PT Goals (current goals can now be found in the care plan section) Acute Rehab PT Goals Patient Stated Goal: to go home  PT Goal Formulation: With patient Time For Goal Achievement: 11/06/16 Potential to Achieve Goals: Good Additional Goals Additional Goal #1: Pt to score >19 on DGI to indicate minimal falls risk. Progress towards PT goals: Progressing toward goals    Frequency    Min 3X/week      PT Plan Discharge plan needs to be updated    Co-evaluation             End of Session Equipment Utilized During Treatment: Gait belt Activity Tolerance: Patient tolerated treatment well Patient left: in bed;with call bell/phone within reach;with family/visitor present     Time: [id number] PT Time Calculation (min) (ACUTE ONLY): 12 min  Charges:  $Gait Training: 8-22 mins  G Codes:      Kristen Lozano 11/01/2016, 9:52 AM

## 2016-11-01 NOTE — Progress Notes (Addendum)
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 She had a stress test over 2015 which demonstrated ant-septal and inf scar with small area of distal anterior ischemia. However, cath was felt to be contraindicated in the setting of a recent CVA. Coumadin was considered given reduced EF and likely thromboembolic CVA. However, the family was hesitant to start this medication. She has been lost to follow-up in the last 2 years. Her last echocardiogram in 2015 showed mild concentric hypertrophy. Dilated ventricle. Severe global hypokinesis with inferior akinesis LVEF 30-35%. Prolonged QT has been improving with correction of electrolyte imbalance.  SUBJECTIVE  The patient denies chest pain or SOB. She feels much better today, AAOx3.  CURRENT MEDS . apixaban  5 mg Oral BID  . aspirin  81 mg Oral Daily  . atorvastatin  20 mg Oral q1800  . carvedilol  25 mg Oral BID WC  . insulin aspart  0-5 Units Subcutaneous QHS  . insulin aspart  0-9 Units Subcutaneous TID WC  . insulin aspart  5 Units Subcutaneous TID WC  . insulin glargine  20 Units Subcutaneous Daily  . lisinopril  10 mg Oral Daily  . sodium chloride flush  3  mL Intravenous Q12H   OBJECTIVE  Vitals:   10/31/16 2108 11/01/16 0507 11/01/16 0845 11/01/16 0928  BP: 104/63 104/63 99/85 140/78  Pulse: (!) 106 83 (!) 101   Resp: 20 20 20    Temp: 98.1 F (36.7 C) 98.2 F (36.8 C) 98.4 F (36.9 C)   TempSrc: Oral Oral Oral   SpO2: 100% 100% 98%   Weight: 107 lb 12.8 oz (48.9 kg) 106 lb 6.4 oz (48.3 kg)    Height: 5' 3"  (1.6 m)       Intake/Output Summary (Last 24 hours) at 11/01/16 0932 Last data filed at 11/01/16 0846  Gross per 24 hour  Intake              960 ml  Output              375 ml  Net              585 ml   Filed Weights   10/28/16 2359 10/31/16 2108 11/01/16 0507  Weight: 107 lb 9.4 oz (48.8 kg) 107 lb 12.8 oz (48.9 kg) 106 lb 6.4 oz (48.3 kg)   PHYSICAL EXAM  General: sitting in chair, aphasic, AAO x1  Neck: Supple without bruits or JVD. Lungs:  Resp regular and unlabored, CTA. Heart: RRR no s3, s4, or murmurs. Abdomen: Soft, non-tender, non-distended, BS + x 4.  Extremities: No clubbing, cyanosis or edema. DP/PT/Radials 2+ and equal bilaterally.  Accessory Clinical Findings  CBC  Recent Labs  10/30/16 0240  WBC 9.8  HGB 12.3  HCT 37.9  MCV 66.7*  PLT 929   Basic Metabolic Panel  Recent Labs  10/30/16 0240 10/31/16 0420 11/01/16 0239  NA 138 136 136  K 3.7 2.9* 4.0  CL 111 105 105  CO2 22 25 24   GLUCOSE 295* 143* 186*  BUN <5* 6 7  CREATININE 0.56 0.48 0.54  CALCIUM 8.7* 8.7* 8.9  MG 2.0 1.8  --    Liver Function Tests  Recent Labs  10/30/16 0240  AST 84*  ALT 45  ALKPHOS 81  BILITOT 0.8  PROT 6.3*  ALBUMIN 2.9*    Recent Labs  10/30/16 0240  CHOL 146  HDL 44  LDLCALC 81  TRIG 104  CHOLHDL 3.3   Ct Angio Chest Pe W Or Wo Contrast  Result Date: 10/29/2016 CLINICAL DATA:  Altered mental status yesterday. Nausea vomiting today. IMPRESSION: 1. Negative for acute pulmonary embolism. 2. Moderate cardiomegaly. 3. Mild interlobular septal thickening, consistent with interstitial  fluid or thickening. No airspace consolidation. No pleural or pericardial effusion. Electronically Signed   By: Andreas Newport M.D.   On: 10/29/2016 00:28   Dg Abdomen Acute W/chest  Result Date: 10/28/2016 CLINICAL DATA:  Abdominal pain, nausea and vomiting.Marland Kitchen IMPRESSION: Unremarkable bowel gas pattern. No evidence of bowel obstruction or pneumoperitoneum. Mild interstitial prominence within both lungs -nonspecific. Electronically Signed   By: Margarette Canada M.D.   On: 10/28/2016 18:53   TELE: SR to ST  ECG: ST, 97 BPM, diffuse negative T waves in the anterolateral and inferior leads  TTE: 10/30/2016 - Left ventricle: Systolic function was mildly to moderately   reduced. The estimated ejection fraction was in the range of 40%   to 45%. Akinesis of the apicalanteroseptal myocardium. Features   are consistent with a pseudonormal left ventricular filling   pattern, with concomitant abnormal relaxation and increased   filling pressure (grade 2 diastolic dysfunction). There was a   large, apicalthrombus. - Mitral valve: There was mild regurgitation.  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   ASSESSMENT AND PLAN  1. Cardiomyopathy, Presumed to be Ischemic 2. Elevated Troponin 3. HTN 4. HLD 5. Prolonged QTc 6. DKA in the Setting of Type 2 DM 7. Acute Encephalopathy  The patient admitted with DKA, electrolyte imbalance, encephalopathy, minimal troponin elevation with flat trend. CTA negative for acute PE. Her EKG shows sinus tachycardia and diffuse ST depressions and negative T waves, these are very different from the prior EKG in 2015. Hypokalemia is persistent, continue aggressive replacement, hypomagnesemia resolved, she continues to have significantly elevated blood glucose, persistent ECG changes - diffuse negative T waves in the anterolateral and inferior leads. Her troponin elevation is minimal. Her recheck  echocardiogram shows LVEF improvement from previous 30-35% with inferior hypokinesis to 40-45% with apical akinesis and a large thrombus. She was started on Eliquis yesterday.  Considering she has a history of stroke with residual aphasia, she still a poor candidate for cardiac catheterization.   She can be discharged from cardiac standpoint, we will arrange for outpatient follow-up with CMP and CBC of the time.  Signed, Ena Dawley MD, [facility] 11/01/2016

## 2016-11-01 NOTE — Discharge Instructions (Addendum)
Information on my medicine - ELIQUIS (apixaban)  This medication education was reviewed with me or my healthcare representative as part of my discharge preparation.  The pharmacist that spoke with me during my hospital stay was:  Einar Grad, [facility]  Why was Eliquis prescribed for you? Eliquis was prescribed for you to reduce the risk of forming blood clots that can cause a stroke if you have a medical condition called atrial fibrillation (a type of irregular heartbeat) OR to reduce the risk of a blood clots forming after orthopedic surgery.  What do You need to know about Eliquis ? Take your Eliquis TWICE DAILY - one tablet in the morning and one tablet in the evening with or without food.  It would be best to take the doses about the same time each day.  If you have difficulty swallowing the tablet whole please discuss with your pharmacist how to take the medication safely.  Take Eliquis exactly as prescribed by your doctor and DO NOT stop taking Eliquis without talking to the doctor who prescribed the medication.  Stopping may increase your risk of developing a new clot or stroke.  Refill your prescription before you run out.  After discharge, you should have regular check-up appointments with your healthcare provider that is prescribing your Eliquis.  In the future your dose may need to be changed if your kidney function or weight changes by a significant amount or as you get older.  What do you do if you miss a dose? If you miss a dose, take it as soon as you remember on the same day and resume taking twice daily.  Do not take more than one dose of ELIQUIS at the same time.  Important Safety Information A possible side effect of Eliquis is bleeding. You should call your healthcare provider right away if you experience any of the following: ? Bleeding from an injury or your nose that does not stop. ? Unusual colored urine (red or dark brown) or unusual colored stools (red or  black). ? Unusual bruising for unknown reasons. ? A serious fall or if you hit your head (even if there is no bleeding).  Some medicines may interact with Eliquis and might increase your risk of bleeding or clotting while on Eliquis. To help avoid this, consult your healthcare provider or pharmacist prior to using any new prescription or non-prescription medications, including herbals, vitamins, non-steroidal anti-inflammatory drugs (NSAIDs) and supplements.  This website has more information on Eliquis (apixaban): [email or web address].   Diabetic Ketoacidosis Diabetic ketoacidosis is a life-threatening complication of diabetes. If it is not treated, it can cause severe dehydration and organ damage and can lead to a coma or death. What are the causes? This condition develops when there is not enough of the hormone insulin in the body. Insulin helps the body to break down sugar for energy. Without insulin, the body cannot break down sugar, so it breaks down fats instead. This leads to the production of acids that are called ketones. Ketones are poisonous at high levels. This condition can be triggered by:  Stress on the body that is brought on by an illness.  Medicines that raise blood glucose levels.  Not taking diabetes medicine. What are the signs or symptoms? Symptoms of this condition include:  Fatigue.  Weight loss.  Excessive thirst.  Light-headedness.  Fruity or sweet-smelling breath.  Excessive urination.  Vision changes.  Confusion or irritability.  Nausea.  Vomiting.  Rapid breathing.  Abdominal pain.  Feeling  flushed. How is this diagnosed? This condition is diagnosed based on a medical history, a physical exam, and blood tests. You may also have a urine test that checks for ketones. How is this treated? This condition may be treated with:  Fluid replacement. This may be done to correct dehydration.  Insulin injections. These may be given through the  skin or through an IV tube.  Electrolyte replacement. Electrolytes, such as potassium and sodium, may be given in pill form or through an IV tube.  Antibiotic medicines. These may be prescribed if your condition was caused by an infection. Follow these instructions at home: Eating and drinking  Drink enough fluids to keep your urine clear or pale yellow.  If you cannot eat, alternate between drinking fluids with sugar (such as juice) and salty fluids (such as broth or bouillon).  If you can eat, follow your usual diet and drink sugar-free liquids, such as water. Other Instructions   Take insulin as directed by your health care provider. Do not skip insulin injections. Do not use expired insulin.  If your blood sugar is over 240 mg/dL, monitor your urine ketones every 4-6 hours.  If you were prescribed an antibiotic medicine, finish all of it even if you start to feel better.  Rest and exercise only as directed by your health care provider.  If you get sick, call your health care provider and begin treatment quickly. Your body often needs extra insulin to fight an illness.  Check your blood glucose levels regularly. If your blood glucose is high, drink plenty of fluids. This helps to flush out ketones. Contact a health care provider if:  Your blood glucose level is too high or too low.  You have ketones in your urine.  You have a fever.  You cannot eat.  You cannot tolerate fluids.  You have been vomiting for more than 2 hours.  You continue to have symptoms of this condition.  You develop new symptoms. Get help right away if:  Your blood glucose levels continue to be high (elevated).  Your monitor reads high even when you are taking insulin.  You faint.  You have chest pain.  You have trouble breathing.  You have a sudden, severe headache.  You have sudden weakness in one arm or one leg.  You have sudden trouble speaking or swallowing.  You have vomiting  or diarrhea that gets worse after 3 hours.  You feel severely fatigued.  You have trouble thinking.  You have abdominal pain.  You are severely dehydrated. Symptoms of severe dehydration include:  Extreme thirst.  Dry mouth.  Blue lips.  Cold hands and feet.  Rapid breathing. This information is not intended to replace advice given to you by your health care provider. Make sure you discuss any questions you have with your health care provider. Document Released: 11/08/2000 Document Revised: 04/18/2016 Document Reviewed: 10/19/2014 Elsevier Interactive Patient Education  2017 [employer].

## 2016-11-01 NOTE — Clinical Social Work Note (Signed)
Patient discharging home with home health today.  CSW signing off.  Dayton Scrape, [place]

## 2016-11-01 NOTE — Progress Notes (Signed)
Occupational Therapy Treatment Patient Details Name: RAKYIA MULLENIX MRN: [id number] DOB: Mar 15, 1956 Today's Date: 11/01/2016    History of present illness AFIFA NESSER is a 60 y.o. female with a past medical history significant for NIDDM, HTN, isch CM with EF 35%, and hx of CVA who presents with 2 days confusion and vomiting.   OT comments  Pt making good progress with functional goals. OT will continue to follow  Follow Up Recommendations  Home health OT;Supervision/Assistance - 24 hour    Equipment Recommendations  None recommended by OT    Recommendations for Other Services      Precautions / Restrictions Precautions Precautions: Fall Precaution Comments: lower education/comprehension deficits Restrictions Weight Bearing Restrictions: No       Mobility Bed Mobility Overal bed mobility: Needs Assistance Bed Mobility: Supine to Sit;Sit to Supine     Supine to sit: Independent Sit to supine: Independent      Transfers Overall transfer level: Needs assistance Equipment used: None Transfers: Sit to/from Stand Sit to Stand: Supervision Stand pivot transfers: Supervision       General transfer comment: Pt steady with no physical assist needed. Pt reports she has been ambulating to/from the bathroom alone.    Balance   Sitting-balance support: No upper extremity supported;Feet supported Sitting balance-Leahy Scale: Normal     Standing balance support: During functional activity;No upper extremity supported Standing balance-Leahy Scale: Good                     ADL Overall ADL's : Needs assistance/impaired     Grooming: Wash/dry face;Brushing hair;Standing;Wash/dry hands;Supervision/safety   Upper Body Bathing: Supervision/ safety;Standing Upper Body Bathing Details (indicate cue type and reason): simulated Lower Body Bathing: Sit to/from stand;Supervison/ safety Lower Body Bathing Details (indicate cue type and reason): simulated     Lower  Body Dressing: Sit to/from stand;Supervision/safety   Toilet Transfer: Ambulation;Comfort height toilet;Grab bars;Supervision/safety   Toileting- Clothing Manipulation and Hygiene: Supervision/safety       Functional mobility during ADLs: Supervision/safety                                        Cognition   Behavior During Therapy: WFL for tasks assessed/performed Overall Cognitive Status: History of cognitive impairments - at baseline                  General Comments: Pt with poor insight into medical issuse which is likely her baseline.  She is slow to process information     Extremity/Trunk Assessment   WFL                        General Comments  pt pleasant and cooperative    Pertinent Vitals/ Pain       Pain Assessment: No/denies pain                                                          Frequency  Min 2X/week        Progress Toward Goals  OT Goals(current goals can now be found in the care plan section)  Progress towards OT goals: Progressing toward goals  Acute Rehab OT Goals Patient Stated  Goal: to go home   Plan Discharge plan remains appropriate                     End of Session Equipment Utilized During Treatment: Gait belt   Activity Tolerance Patient tolerated treatment well   Patient Left in bed;with call bell/phone within reach             Time: 0855-0909 OT Time Calculation (min): 14 min  Charges: OT General Charges $OT Visit: 1 Procedure OT Treatments $Self Care/Home Management : 8-22 mins  Britt Bottom 11/01/2016, 11:39 AM

## 2016-11-01 NOTE — Care Management Important Message (Signed)
Important Message  Patient Details  Name: Kristen Lozano MRN: [id number] Date of Birth: 1955/12/07   Medicare Important Message Given:  Yes    [name] 11/01/2016, 11:14 AM

## 2016-11-02 NOTE — Progress Notes (Signed)
Called by pharmacy on 12/8 late afternoon regarding eliquis which is not covered under insurance. Pradaxa and Xarelto also not covered. Unfortunately, patient had already been discharged home at this point. I called and spoke with patient's sister (one of caregivers) over the phone today and explained this situation. She has not yet established with PCP yet (first appointment in 12/19) so warfarin is not a good option as she has no way of getting INR checks. We have agreed on lovenox BID. I also called and spoke with patient's husband (main caregiver) over the phone and explained. He is willing and able to give lovenox shots twice daily. This was called in to [facility]. Unfortunately they do not have supply until Monday 12/11. This was relayed to family and as well as her increased risk of stroke without blood thinning agents. They voiced understanding. All questions were answered.    Dessa Phi, DO [facility] [email or web address] Password [email or web address] 11/02/2016, 3:03 PM

## 2016-11-04 NOTE — Progress Notes (Signed)
Follow up concerning patient's medication Xarelto not covered by her insurance  [employer]- spoke to [employer] PharmD; patient has private insurance with [facility] with prescription drug coverage. Xarelto requires prior authorization before medication can be approved.  CM was not informed that patient was going to be discharged on Xarelto- no benefit check or coupon card was given to the patient. Simonne Martinet RN,MHA,BSN [phone]

## 2016-11-11 ENCOUNTER — Encounter (INDEPENDENT_AMBULATORY_CARE_PROVIDER_SITE_OTHER): Payer: Self-pay

## 2016-11-11 ENCOUNTER — Ambulatory Visit (INDEPENDENT_AMBULATORY_CARE_PROVIDER_SITE_OTHER): Payer: Medicare Other | Admitting: Physician Assistant

## 2016-11-11 ENCOUNTER — Encounter: Payer: Self-pay | Admitting: Physician Assistant

## 2016-11-11 VITALS — BP 100/62 | HR 102 | Ht 63.0 in | Wt 107.8 lb

## 2016-11-11 DIAGNOSIS — I236 Thrombosis of atrium, auricular appendage, and ventricle as current complications following acute myocardial infarction: Secondary | ICD-10-CM

## 2016-11-11 DIAGNOSIS — I10 Essential (primary) hypertension: Secondary | ICD-10-CM

## 2016-11-11 DIAGNOSIS — I639 Cerebral infarction, unspecified: Secondary | ICD-10-CM

## 2016-11-11 DIAGNOSIS — I255 Ischemic cardiomyopathy: Secondary | ICD-10-CM | POA: Diagnosis not present

## 2016-11-11 DIAGNOSIS — I2129 ST elevation (STEMI) myocardial infarction involving other sites: Secondary | ICD-10-CM | POA: Diagnosis not present

## 2016-11-11 MED ORDER — CARVEDILOL 25 MG PO TABS: 25.0000 mg | ORAL_TABLET | Freq: Two times a day (BID) | ORAL | 3 refills | Status: DC

## 2016-11-11 MED ORDER — WARFARIN SODIUM 5 MG PO TABS: 5.0000 mg | ORAL_TABLET | Freq: Every day | ORAL | 0 refills | Status: DC

## 2016-11-11 MED ORDER — LISINOPRIL 10 MG PO TABS: 10.0000 mg | ORAL_TABLET | Freq: Every day | ORAL | 3 refills | Status: DC

## 2016-11-11 MED ORDER — LISINOPRIL 10 MG PO TABS
10.0000 mg | ORAL_TABLET | Freq: Every day | ORAL | 3 refills | Status: DC
Start: 2016-11-11 — End: 2017-08-18

## 2016-11-11 MED ORDER — ASPIRIN 81 MG PO TABS: 81.0000 mg | ORAL_TABLET | Freq: Every day | ORAL | 11 refills | Status: DC

## 2016-11-11 MED ORDER — ATORVASTATIN CALCIUM 20 MG PO TABS: 20.0000 mg | ORAL_TABLET | Freq: Every day | ORAL | 3 refills | Status: DC

## 2016-11-11 NOTE — Progress Notes (Signed)
Cardiology Office Note    Date:  11/11/2016   ID:  Kristen Lozano, DOB 1956/07/10, MRN [id number]  PCP:  Audley Hose, MD  Cardiologist: Dr. Debara Pickett  No chief complaint on file.   History of Present Illness:  Kristen Lozano is a 60 y.o. female with a hx of HTN, DM2, sickle cell trait, OA. She was admitted in 2015 with a L brain CVA and Echo/TEE demonstrated EF ~ 35%. Work up included a nuclear stress test which demonstrated ant-septal and inf scar with small area of distal anterior ischemia. However, cath was felt to be contraindicated in the setting of a recent CVA. Coumadin was considered given reduced EF and likely thromboembolic CVA. However, the family was hesitant to start this medication. Prior to DC Dr. Percival Spanish mentioned an EP eval for possible LINQ device. It does not appear that this was done. Family declined w/u with cardiac catherization due to stroke.   Patient had recent hospitalization with DKA in the setting of type 2 diabetes mellitus, and minimal troponin elevation but flat trend. CTA was negative for acute PE. EKG showed sinus tachycardia with diffuse ST depression and negative T waves very different from prior EKGs in 2015. Hypokalemia was persistent and replaced but EKG changes persisted. Echocardiogram showed LVEF improved from previous 30-35% with inferior hypokinesis to 40-45% with apical akinesis and a large thrombus. Eliquis, Xarelto, and Pradaxa weren't  Approved by insurance, coumadin not a good option because she can't get INR checked. Started on Lovenox.  Patient is here today accompanied by her sister who helps care for her. Her husband has a primary caregiver. She has a fear shots and doesn't like to have them. She denies any chest pain, palpitations, dyspnea, dyspnea on exertion, dizziness or presyncope. She says her heart races at times but usually when she is nervous.        Past Medical History:  Diagnosis Date  . Allergic rhinitis    Requires  cetirizine, singulair, and fluticasone.  . Anxiety    Has been on Xanax since 2009. Uses it for stress, anxiety, and insomnia. No contract yet.  . Arthritis   . CHF (congestive heart failure) (HCC)    EF 35% after stroke, presumed ischemic  . Chronic pain    Has OA of knees B. No Xrays in echart. Not requiring narcotics.  . Depression   . Diabetes mellitus    Type 2, non insulin dependent. Was dx'd prior to 2008.  Marland Kitchen Hyperglycemia   . Hyperlipemia   . Hypertension   . Sickle cell trait ([place])   . 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 TEE WITHOUT CARDIOVERSION N/A 09/06/2014   Procedure: TRANSESOPHAGEAL ECHOCARDIOGRAM (TEE);  Surgeon: Pixie Casino, MD;  Location: [facility] ENDOSCOPY;  Service: Cardiovascular;  Laterality: N/A;    Current Medications: Outpatient Medications Prior to Visit  Medication Sig Dispense Refill  . metFORMIN (GLUCOPHAGE) 1000 MG tablet Take 1 tablet (1,000 mg total) by mouth 2 (two) times daily with a meal. 60 tablet 0  . sitaGLIPtin (JANUVIA) 100 MG tablet Take 1 tablet (100 mg total) by mouth daily. 30 tablet 0  . aspirin 81 MG tablet Take 1 tablet (81 mg total) by mouth daily. 30 tablet 0  . atorvastatin (LIPITOR) 20 MG tablet Take 1 tablet (20 mg total) by mouth daily at 6 PM. 30 tablet 0  .  carvedilol (COREG) 25 MG tablet Take 1 tablet (25 mg total) by mouth 2 (two) times daily with a meal. 60 tablet 0  . dabigatran (PRADAXA) 150 MG CAPS capsule Take 1 capsule (150 mg total) by mouth 2 (two) times daily. 60 capsule 0  . lisinopril (PRINIVIL,ZESTRIL) 10 MG tablet Take 1 tablet (10 mg total) by mouth daily. 30 tablet 0   No facility-administered medications prior to visit.      Allergies:   Patient has no known allergies.   Social History   Social History  . Marital status: Legally Separated    Spouse name: N/A  . Number of children: 2  . Years  of education: 39 TH   Occupational History  . Disabled    Social History Main Topics  . Smoking status: Never Smoker  . Smokeless tobacco: Never Used  . Alcohol use No  . Drug use: No  . Sexual activity: Not Asked   Other Topics Concern  . None   Social History Narrative   Patient is married with 2 children.   Patient is right handed.   Patient has 9 th grade education.   Patient drinks 2 cups daily.     Family History:  The patient's family history includes Diabetes in her father and mother; Heart attack in her father; Heart disease in her father; Hypertension in her brother, daughter, father, mother, and sister; Kidney disease in her brother and sister; Liver cancer in her sister; Ovarian cancer in her sister; Schizophrenia in her daughter; Sickle cell anemia in her daughter.   ROS:   Please see the history of present illness.    Review of Systems  Constitution: Negative.  HENT: Negative.   Eyes: Negative.   Cardiovascular: Negative.   Respiratory: Negative.   Hematologic/Lymphatic: Negative.   Musculoskeletal: Negative.  Negative for joint pain.  Gastrointestinal: Positive for constipation, diarrhea, nausea and vomiting.  Genitourinary: Negative.   Neurological: Negative.    All other systems reviewed and are negative.   PHYSICAL EXAM:   VS:  BP 100/62 (BP Location: Left Arm, Patient Position: Sitting, Cuff Size: Normal)   Pulse (!) 102   Ht 5' 3"  (1.6 m)   Wt 107 lb 12.8 oz (48.9 kg)   SpO2 99%   BMI 19.10 kg/m   Physical Exam  GEN: Well nourished, well developed, in no acute distress  Neck: no JVD, carotid bruits, or masses Cardiac:RRR; positive S4, 2/6 systolic murmur at the left sternal border Respiratory:  clear to auscultation bilaterally, normal work of breathing GI: soft, nontender, nondistended, + BS Ext: without cyanosis, clubbing, or edema, Good distal pulses bilaterally MS: no deformity or atrophy  Skin: warm and dry, no rash Neuro:   aphasic Psych: euthymic mood, full affect  Wt Readings from Last 3 Encounters:  11/11/16 107 lb 12.8 oz (48.9 kg)  11/01/16 106 lb 6.4 oz (48.3 kg)  03/08/15 91 lb (41.3 kg)      Studies/Labs Reviewed:   EKG:  EKG is ordered today.   Recent Labs: 10/30/2016: ALT 45; Hemoglobin 12.3; Platelets 183 10/31/2016: Magnesium 1.8 11/01/2016: BUN 7; Creatinine, Ser 0.54; Potassium 4.0; Sodium 136   Lipid Panel    Component Value Date/Time   CHOL 146 10/30/2016 0240   TRIG 104 10/30/2016 0240   HDL 44 10/30/2016 0240   CHOLHDL 3.3 10/30/2016 0240   VLDL 21 10/30/2016 0240   LDLCALC 81 10/30/2016 0240    Additional studies/ records that were reviewed today include:  TTE:  10/30/2016 - Left ventricle: Systolic function was mildly to moderately   reduced. The estimated ejection fraction was in the range of 40%   to 45%. Akinesis of the apicalanteroseptal myocardium. Features   are consistent with a pseudonormal left ventricular filling   pattern, with concomitant abnormal relaxation and increased   filling pressure (grade 2 diastolic dysfunction). There was a   large, apicalthrombus. - Mitral valve: There was mild regurgitation.   Impressions:   - There is a large mass in the apex of the LV that is concerning   for apical mural thrombus.       ASSESSMENT:    1. 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 Cardiomyopathy, ischemic   3. Cerebrovascular accident (CVA), unspecified mechanism ([place])   4. Essential hypertension      PLAN:  In order of problems listed above:  LV thrombus on 2-D echo. Patient is on Lovenox injections. She has a fear of needles and would like to take a pill. Insurance would not approve Pradaxa, Xarelto or eliquis. Coumadin not started because family could not get her for an INR check regularly. Sister is with her here today. She says she is willing to bring her weekly for her INRs. I discussed this with Coumadin clinic and will start  5 mg daily. She should continue Lovenox daily until she is therapeutic. We have scheduled a follow-up office visit with Coumadin clinic on Friday. Follow-up with Dr. Debara Pickett in 4-6 weeks.  Ischemic cardiomyopathy LVEF actually improved from 2015 and is now 40-45%. Patient had an abnormal stress test back then. Cardiac catheterization never done because of CVA. She does not have angina. Continue medical management.No heart failure and exam.   Prior CVA with expressive aphasia  Essential hypertension blood pressures on the low side. Continue current dose of Coreg and lisinopril.    Medication Adjustments/Labs and Tests Ordered: Current medicines are reviewed at length with the patient today.  Concerns regarding medicines are outlined above.  Medication changes, Labs and Tests ordered today are listed in the Patient Instructions below. Patient Instructions  Medication Instructions:  Your physician has recommended you make the following change in your medication:  1.  START Coumadin 5 mg taking 1 tablet daily and continue the Lovenox injections as well.  Labwork: None ordered  Testing/Procedures: None ordered  Follow-Up: Your physician recommends that you schedule a follow-up appointment in: 11/15/16 WITH THE COUMADIN CLINIC.  ARRIVE AT 10:15 FOR A 10:30 APPT.  Your physician recommends that you schedule a follow-up appointment in: 4-6 [place] DR. HILTY    Any Other Special Instructions Will Be Listed Below (If Applicable).   If you need a refill on your cardiac medications before your next appointment, please call your pharmacy.      Sumner Boast, PA-C  11/11/2016 10:17 AM    [facility] [address], [place], [place]  [address] Phone: [phone]; Fax: [phone]

## 2016-11-11 NOTE — Patient Instructions (Addendum)
Medication Instructions:  Your physician has recommended you make the following change in your medication:  1.  START Coumadin 5 mg taking 1 tablet daily and continue the Lovenox injections as well.  Labwork: None ordered  Testing/Procedures: None ordered  Follow-Up: Your physician recommends that you schedule a follow-up appointment in: 11/15/16 WITH THE COUMADIN CLINIC.  ARRIVE AT 10:15 FOR A 10:30 APPT.  Your physician recommends that you schedule a follow-up appointment in: 4-6 [place] DR. HILTY    Any Other Special Instructions Will Be Listed Below (If Applicable).   If you need a refill on your cardiac medications before your next appointment, please call your pharmacy.

## 2016-11-14 ENCOUNTER — Ambulatory Visit (INDEPENDENT_AMBULATORY_CARE_PROVIDER_SITE_OTHER): Payer: Medicare Other | Admitting: Pharmacist Clinician (PhC)/ Clinical Pharmacy Specialist

## 2016-11-14 DIAGNOSIS — E785 Hyperlipidemia, unspecified: Secondary | ICD-10-CM | POA: Diagnosis present

## 2016-11-14 DIAGNOSIS — I2129 ST elevation (STEMI) myocardial infarction involving other sites: Secondary | ICD-10-CM

## 2016-11-14 DIAGNOSIS — I255 Ischemic cardiomyopathy: Secondary | ICD-10-CM | POA: Insufficient documentation

## 2016-11-14 DIAGNOSIS — Z7901 Long term (current) use of anticoagulants: Secondary | ICD-10-CM

## 2016-11-14 DIAGNOSIS — Z5181 Encounter for therapeutic drug level monitoring: Secondary | ICD-10-CM

## 2016-11-14 DIAGNOSIS — I236 Thrombosis of atrium, auricular appendage, and ventricle as current complications following acute myocardial infarction: Secondary | ICD-10-CM | POA: Diagnosis present

## 2016-11-14 DIAGNOSIS — I509 Heart failure, unspecified: Secondary | ICD-10-CM

## 2016-11-14 LAB — POCT INR: INR: 1.1

## 2016-11-19 ENCOUNTER — Ambulatory Visit (INDEPENDENT_AMBULATORY_CARE_PROVIDER_SITE_OTHER): Payer: Medicare Other | Admitting: Pharmacist

## 2016-11-19 DIAGNOSIS — I2129 ST elevation (STEMI) myocardial infarction involving other sites: Secondary | ICD-10-CM

## 2016-11-19 DIAGNOSIS — Z5181 Encounter for therapeutic drug level monitoring: Secondary | ICD-10-CM

## 2016-11-19 DIAGNOSIS — Z7901 Long term (current) use of anticoagulants: Secondary | ICD-10-CM

## 2016-11-19 DIAGNOSIS — I236 Thrombosis of atrium, auricular appendage, and ventricle as current complications following acute myocardial infarction: Secondary | ICD-10-CM

## 2016-11-19 LAB — POCT INR: INR: 3

## 2016-11-26 ENCOUNTER — Ambulatory Visit (INDEPENDENT_AMBULATORY_CARE_PROVIDER_SITE_OTHER): Payer: Medicare Other | Admitting: Pharmacist Clinician (PhC)/ Clinical Pharmacy Specialist

## 2016-11-26 DIAGNOSIS — I2129 ST elevation (STEMI) myocardial infarction involving other sites: Secondary | ICD-10-CM

## 2016-11-26 DIAGNOSIS — Z7901 Long term (current) use of anticoagulants: Secondary | ICD-10-CM

## 2016-11-26 DIAGNOSIS — Z5181 Encounter for therapeutic drug level monitoring: Secondary | ICD-10-CM

## 2016-11-26 DIAGNOSIS — I236 Thrombosis of atrium, auricular appendage, and ventricle as current complications following acute myocardial infarction: Secondary | ICD-10-CM

## 2016-11-26 LAB — POCT INR: INR: 2.9

## 2016-11-27 ENCOUNTER — Other Ambulatory Visit: Payer: Self-pay | Admitting: Physician Assistant

## 2016-11-28 ENCOUNTER — Telehealth: Payer: Self-pay | Admitting: Internal Medicine

## 2016-11-28 NOTE — Telephone Encounter (Signed)
Faxed signed orders related to coumadin management to [facility]

## 2016-12-04 ENCOUNTER — Ambulatory Visit (INDEPENDENT_AMBULATORY_CARE_PROVIDER_SITE_OTHER): Payer: Medicare Other | Admitting: Pharmacist Clinician (PhC)/ Clinical Pharmacy Specialist

## 2016-12-04 DIAGNOSIS — I236 Thrombosis of atrium, auricular appendage, and ventricle as current complications following acute myocardial infarction: Secondary | ICD-10-CM

## 2016-12-04 DIAGNOSIS — I2129 ST elevation (STEMI) myocardial infarction involving other sites: Secondary | ICD-10-CM

## 2016-12-04 DIAGNOSIS — Z5181 Encounter for therapeutic drug level monitoring: Secondary | ICD-10-CM

## 2016-12-04 DIAGNOSIS — Z7901 Long term (current) use of anticoagulants: Secondary | ICD-10-CM

## 2016-12-04 LAB — POCT INR: INR: 4

## 2016-12-09 ENCOUNTER — Telehealth: Payer: Self-pay | Admitting: Internal Medicine

## 2016-12-09 NOTE — Telephone Encounter (Signed)
Faxed signed orders to [facility] regarding PT/INR & coumadin dosing

## 2016-12-11 ENCOUNTER — Ambulatory Visit (INDEPENDENT_AMBULATORY_CARE_PROVIDER_SITE_OTHER): Payer: Medicare Other | Admitting: Pharmacist Clinician (PhC)/ Clinical Pharmacy Specialist

## 2016-12-11 DIAGNOSIS — Z7901 Long term (current) use of anticoagulants: Secondary | ICD-10-CM

## 2016-12-11 DIAGNOSIS — I2129 ST elevation (STEMI) myocardial infarction involving other sites: Secondary | ICD-10-CM

## 2016-12-11 DIAGNOSIS — Z5181 Encounter for therapeutic drug level monitoring: Secondary | ICD-10-CM

## 2016-12-11 DIAGNOSIS — I236 Thrombosis of atrium, auricular appendage, and ventricle as current complications following acute myocardial infarction: Secondary | ICD-10-CM

## 2016-12-11 LAB — POCT INR: INR: 1.7

## 2016-12-16 ENCOUNTER — Ambulatory Visit (INDEPENDENT_AMBULATORY_CARE_PROVIDER_SITE_OTHER): Payer: Medicare Other | Admitting: Pharmacist

## 2016-12-16 ENCOUNTER — Encounter: Payer: Self-pay | Admitting: Internal Medicine

## 2016-12-16 ENCOUNTER — Ambulatory Visit (INDEPENDENT_AMBULATORY_CARE_PROVIDER_SITE_OTHER): Payer: Medicare Other | Admitting: Internal Medicine

## 2016-12-16 VITALS — BP 96/62 | HR 100 | Ht 63.0 in | Wt 104.0 lb

## 2016-12-16 DIAGNOSIS — I5022 Chronic systolic (congestive) heart failure: Secondary | ICD-10-CM | POA: Diagnosis not present

## 2016-12-16 DIAGNOSIS — I255 Ischemic cardiomyopathy: Secondary | ICD-10-CM | POA: Diagnosis not present

## 2016-12-16 DIAGNOSIS — Z5181 Encounter for therapeutic drug level monitoring: Secondary | ICD-10-CM | POA: Diagnosis not present

## 2016-12-16 DIAGNOSIS — I639 Cerebral infarction, unspecified: Secondary | ICD-10-CM | POA: Diagnosis not present

## 2016-12-16 DIAGNOSIS — I10 Essential (primary) hypertension: Secondary | ICD-10-CM

## 2016-12-16 DIAGNOSIS — Z7901 Long term (current) use of anticoagulants: Secondary | ICD-10-CM

## 2016-12-16 DIAGNOSIS — I236 Thrombosis of atrium, auricular appendage, and ventricle as current complications following acute myocardial infarction: Secondary | ICD-10-CM

## 2016-12-16 DIAGNOSIS — I2129 ST elevation (STEMI) myocardial infarction involving other sites: Secondary | ICD-10-CM | POA: Diagnosis not present

## 2016-12-16 DIAGNOSIS — I5042 Chronic combined systolic (congestive) and diastolic (congestive) heart failure: Secondary | ICD-10-CM | POA: Insufficient documentation

## 2016-12-16 LAB — POCT INR: INR: 1.9
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
## 2016-12-16 NOTE — Patient Instructions (Signed)
Your physician wants you to follow-up in: 6 months with Dr. Hilty. You will receive a reminder letter in the mail two months in advance. If you don't receive a letter, please call our office to schedule the follow-up appointment.    

## 2016-12-24 ENCOUNTER — Other Ambulatory Visit: Payer: Self-pay | Admitting: Internal Medicine

## 2016-12-30 ENCOUNTER — Ambulatory Visit (INDEPENDENT_AMBULATORY_CARE_PROVIDER_SITE_OTHER): Payer: Medicare Other

## 2016-12-30 DIAGNOSIS — Z5181 Encounter for therapeutic drug level monitoring: Secondary | ICD-10-CM

## 2016-12-30 DIAGNOSIS — Z7901 Long term (current) use of anticoagulants: Secondary | ICD-10-CM

## 2016-12-30 DIAGNOSIS — I236 Thrombosis of atrium, auricular appendage, and ventricle as current complications following acute myocardial infarction: Secondary | ICD-10-CM

## 2016-12-30 DIAGNOSIS — I2129 ST elevation (STEMI) myocardial infarction involving other sites: Secondary | ICD-10-CM

## 2016-12-30 LAB — POCT INR: INR: 1.7

## 2017-01-13 ENCOUNTER — Ambulatory Visit (INDEPENDENT_AMBULATORY_CARE_PROVIDER_SITE_OTHER): Payer: Medicare Other | Admitting: Pharmacist

## 2017-01-13 DIAGNOSIS — I236 Thrombosis of atrium, auricular appendage, and ventricle as current complications following acute myocardial infarction: Secondary | ICD-10-CM | POA: Diagnosis not present

## 2017-01-13 DIAGNOSIS — I2129 ST elevation (STEMI) myocardial infarction involving other sites: Secondary | ICD-10-CM | POA: Diagnosis not present

## 2017-01-13 DIAGNOSIS — Z5181 Encounter for therapeutic drug level monitoring: Secondary | ICD-10-CM

## 2017-01-13 DIAGNOSIS — Z7901 Long term (current) use of anticoagulants: Secondary | ICD-10-CM

## 2017-01-13 LAB — POCT INR: INR: 2.5

## 2017-02-03 ENCOUNTER — Ambulatory Visit (INDEPENDENT_AMBULATORY_CARE_PROVIDER_SITE_OTHER): Payer: Medicare Other

## 2017-02-03 DIAGNOSIS — Z7901 Long term (current) use of anticoagulants: Secondary | ICD-10-CM | POA: Diagnosis not present

## 2017-02-03 DIAGNOSIS — I236 Thrombosis of atrium, auricular appendage, and ventricle as current complications following acute myocardial infarction: Secondary | ICD-10-CM

## 2017-02-03 DIAGNOSIS — Z5181 Encounter for therapeutic drug level monitoring: Secondary | ICD-10-CM | POA: Diagnosis not present

## 2017-02-03 DIAGNOSIS — I2129 ST elevation (STEMI) myocardial infarction involving other sites: Secondary | ICD-10-CM

## 2017-02-03 LAB — POCT INR: INR: 2

## 2017-03-03 ENCOUNTER — Ambulatory Visit (INDEPENDENT_AMBULATORY_CARE_PROVIDER_SITE_OTHER): Payer: Medicare Other | Admitting: *Deleted

## 2017-03-03 DIAGNOSIS — Z7901 Long term (current) use of anticoagulants: Secondary | ICD-10-CM

## 2017-03-03 DIAGNOSIS — I236 Thrombosis of atrium, auricular appendage, and ventricle as current complications following acute myocardial infarction: Secondary | ICD-10-CM

## 2017-03-03 DIAGNOSIS — Z5181 Encounter for therapeutic drug level monitoring: Secondary | ICD-10-CM | POA: Diagnosis not present

## 2017-03-03 LAB — POCT INR: INR: 1.6

## 2017-03-17 ENCOUNTER — Ambulatory Visit (INDEPENDENT_AMBULATORY_CARE_PROVIDER_SITE_OTHER): Payer: Medicare Other | Admitting: *Deleted

## 2017-03-17 DIAGNOSIS — I236 Thrombosis of atrium, auricular appendage, and ventricle as current complications following acute myocardial infarction: Secondary | ICD-10-CM

## 2017-03-17 DIAGNOSIS — Z5181 Encounter for therapeutic drug level monitoring: Secondary | ICD-10-CM | POA: Diagnosis not present

## 2017-03-17 DIAGNOSIS — Z7901 Long term (current) use of anticoagulants: Secondary | ICD-10-CM

## 2017-03-17 LAB — POCT INR: INR: 1.8

## 2017-03-31 ENCOUNTER — Ambulatory Visit (INDEPENDENT_AMBULATORY_CARE_PROVIDER_SITE_OTHER): Payer: Medicare Other | Admitting: *Deleted

## 2017-03-31 DIAGNOSIS — Z7901 Long term (current) use of anticoagulants: Secondary | ICD-10-CM | POA: Diagnosis not present

## 2017-03-31 DIAGNOSIS — Z5181 Encounter for therapeutic drug level monitoring: Secondary | ICD-10-CM | POA: Diagnosis not present

## 2017-03-31 DIAGNOSIS — I236 Thrombosis of atrium, auricular appendage, and ventricle as current complications following acute myocardial infarction: Secondary | ICD-10-CM | POA: Diagnosis not present

## 2017-03-31 LAB — POCT INR: INR: 1.2

## 2017-04-07 ENCOUNTER — Ambulatory Visit (INDEPENDENT_AMBULATORY_CARE_PROVIDER_SITE_OTHER): Payer: Medicare Other | Admitting: *Deleted

## 2017-04-07 DIAGNOSIS — Z7901 Long term (current) use of anticoagulants: Secondary | ICD-10-CM

## 2017-04-07 DIAGNOSIS — I236 Thrombosis of atrium, auricular appendage, and ventricle as current complications following acute myocardial infarction: Secondary | ICD-10-CM | POA: Diagnosis not present

## 2017-04-07 DIAGNOSIS — Z5181 Encounter for therapeutic drug level monitoring: Secondary | ICD-10-CM | POA: Diagnosis not present

## 2017-04-07 LAB — POCT INR: INR: 2

## 2017-04-15 ENCOUNTER — Ambulatory Visit (INDEPENDENT_AMBULATORY_CARE_PROVIDER_SITE_OTHER): Payer: Medicare Other | Admitting: *Deleted

## 2017-04-15 DIAGNOSIS — Z5181 Encounter for therapeutic drug level monitoring: Secondary | ICD-10-CM

## 2017-04-15 DIAGNOSIS — Z7901 Long term (current) use of anticoagulants: Secondary | ICD-10-CM

## 2017-04-15 DIAGNOSIS — I236 Thrombosis of atrium, auricular appendage, and ventricle as current complications following acute myocardial infarction: Secondary | ICD-10-CM

## 2017-04-15 LAB — POCT INR: INR: 1.9

## 2017-04-23 ENCOUNTER — Encounter (INDEPENDENT_AMBULATORY_CARE_PROVIDER_SITE_OTHER): Payer: Self-pay

## 2017-04-23 ENCOUNTER — Ambulatory Visit (INDEPENDENT_AMBULATORY_CARE_PROVIDER_SITE_OTHER): Payer: Medicare Other | Admitting: *Deleted

## 2017-04-23 DIAGNOSIS — I236 Thrombosis of atrium, auricular appendage, and ventricle as current complications following acute myocardial infarction: Secondary | ICD-10-CM | POA: Diagnosis not present

## 2017-04-23 DIAGNOSIS — Z5181 Encounter for therapeutic drug level monitoring: Secondary | ICD-10-CM

## 2017-04-23 DIAGNOSIS — Z7901 Long term (current) use of anticoagulants: Secondary | ICD-10-CM | POA: Diagnosis not present

## 2017-04-23 LAB — POCT INR: INR: 2.3

## 2017-05-07 ENCOUNTER — Ambulatory Visit (INDEPENDENT_AMBULATORY_CARE_PROVIDER_SITE_OTHER): Payer: Medicare Other | Admitting: *Deleted

## 2017-05-07 DIAGNOSIS — Z5181 Encounter for therapeutic drug level monitoring: Secondary | ICD-10-CM | POA: Diagnosis not present

## 2017-05-07 DIAGNOSIS — Z7901 Long term (current) use of anticoagulants: Secondary | ICD-10-CM | POA: Diagnosis not present

## 2017-05-07 DIAGNOSIS — I236 Thrombosis of atrium, auricular appendage, and ventricle as current complications following acute myocardial infarction: Secondary | ICD-10-CM

## 2017-05-07 LAB — POCT INR: INR: 1.5

## 2017-05-14 ENCOUNTER — Ambulatory Visit (INDEPENDENT_AMBULATORY_CARE_PROVIDER_SITE_OTHER): Payer: Medicare Other

## 2017-05-14 DIAGNOSIS — Z5181 Encounter for therapeutic drug level monitoring: Secondary | ICD-10-CM | POA: Diagnosis not present

## 2017-05-14 DIAGNOSIS — Z7901 Long term (current) use of anticoagulants: Secondary | ICD-10-CM | POA: Diagnosis not present

## 2017-05-14 DIAGNOSIS — I236 Thrombosis of atrium, auricular appendage, and ventricle as current complications following acute myocardial infarction: Secondary | ICD-10-CM

## 2017-05-14 LAB — POCT INR: INR: 1.4

## 2017-05-21 ENCOUNTER — Ambulatory Visit (INDEPENDENT_AMBULATORY_CARE_PROVIDER_SITE_OTHER): Payer: Medicare Other | Admitting: *Deleted

## 2017-05-21 DIAGNOSIS — Z7901 Long term (current) use of anticoagulants: Secondary | ICD-10-CM | POA: Diagnosis not present

## 2017-05-21 DIAGNOSIS — I236 Thrombosis of atrium, auricular appendage, and ventricle as current complications following acute myocardial infarction: Secondary | ICD-10-CM

## 2017-05-21 DIAGNOSIS — Z5181 Encounter for therapeutic drug level monitoring: Secondary | ICD-10-CM

## 2017-05-21 LAB — POCT INR: INR: 2.7

## 2017-06-05 ENCOUNTER — Ambulatory Visit (INDEPENDENT_AMBULATORY_CARE_PROVIDER_SITE_OTHER): Payer: Medicare Other | Admitting: *Deleted

## 2017-06-05 DIAGNOSIS — Z5181 Encounter for therapeutic drug level monitoring: Secondary | ICD-10-CM

## 2017-06-05 DIAGNOSIS — Z7901 Long term (current) use of anticoagulants: Secondary | ICD-10-CM | POA: Diagnosis not present

## 2017-06-05 DIAGNOSIS — I236 Thrombosis of atrium, auricular appendage, and ventricle as current complications following acute myocardial infarction: Secondary | ICD-10-CM

## 2017-06-05 LAB — POCT INR: INR: 2.9

## 2017-06-26 ENCOUNTER — Ambulatory Visit (INDEPENDENT_AMBULATORY_CARE_PROVIDER_SITE_OTHER): Payer: Medicare Other | Admitting: *Deleted

## 2017-06-26 DIAGNOSIS — Z7901 Long term (current) use of anticoagulants: Secondary | ICD-10-CM

## 2017-06-26 DIAGNOSIS — Z5181 Encounter for therapeutic drug level monitoring: Secondary | ICD-10-CM

## 2017-06-26 DIAGNOSIS — I236 Thrombosis of atrium, auricular appendage, and ventricle as current complications following acute myocardial infarction: Secondary | ICD-10-CM

## 2017-06-26 LAB — POCT INR: INR: 1.9

## 2017-07-21 ENCOUNTER — Other Ambulatory Visit: Payer: Self-pay | Admitting: Physician Assistant

## 2017-07-23 ENCOUNTER — Ambulatory Visit (INDEPENDENT_AMBULATORY_CARE_PROVIDER_SITE_OTHER): Payer: Medicare Other | Admitting: *Deleted

## 2017-07-23 DIAGNOSIS — I236 Thrombosis of atrium, auricular appendage, and ventricle as current complications following acute myocardial infarction: Secondary | ICD-10-CM

## 2017-07-23 DIAGNOSIS — Z7901 Long term (current) use of anticoagulants: Secondary | ICD-10-CM

## 2017-07-23 DIAGNOSIS — Z5181 Encounter for therapeutic drug level monitoring: Secondary | ICD-10-CM | POA: Diagnosis not present

## 2017-07-23 LAB — POCT INR: INR: 1.4

## 2017-08-01 ENCOUNTER — Ambulatory Visit (INDEPENDENT_AMBULATORY_CARE_PROVIDER_SITE_OTHER): Payer: Medicare Other | Admitting: *Deleted

## 2017-08-01 DIAGNOSIS — Z5181 Encounter for therapeutic drug level monitoring: Secondary | ICD-10-CM | POA: Diagnosis not present

## 2017-08-01 DIAGNOSIS — Z7901 Long term (current) use of anticoagulants: Secondary | ICD-10-CM

## 2017-08-01 DIAGNOSIS — I236 Thrombosis of atrium, auricular appendage, and ventricle as current complications following acute myocardial infarction: Secondary | ICD-10-CM

## 2017-08-01 LAB — POCT INR: INR: 3.6

## 2017-08-15 ENCOUNTER — Ambulatory Visit (INDEPENDENT_AMBULATORY_CARE_PROVIDER_SITE_OTHER): Payer: Medicare Other | Admitting: *Deleted

## 2017-08-15 DIAGNOSIS — Z7901 Long term (current) use of anticoagulants: Secondary | ICD-10-CM

## 2017-08-15 DIAGNOSIS — I236 Thrombosis of atrium, auricular appendage, and ventricle as current complications following acute myocardial infarction: Secondary | ICD-10-CM | POA: Diagnosis not present

## 2017-08-15 DIAGNOSIS — Z5181 Encounter for therapeutic drug level monitoring: Secondary | ICD-10-CM | POA: Diagnosis not present

## 2017-08-15 LAB — POCT INR: INR: 1.8

## 2017-08-18 ENCOUNTER — Other Ambulatory Visit: Payer: Self-pay | Admitting: Physician Assistant

## 2017-08-18 NOTE — Telephone Encounter (Signed)
Rx has been sent to the pharmacy electronically. ° °

## 2017-08-19 ENCOUNTER — Other Ambulatory Visit: Payer: Self-pay | Admitting: Physician Assistant

## 2017-08-26 ENCOUNTER — Other Ambulatory Visit: Payer: Self-pay | Admitting: Physician Assistant

## 2017-09-02 ENCOUNTER — Other Ambulatory Visit: Payer: Self-pay | Admitting: Physician Assistant

## 2017-09-05 ENCOUNTER — Ambulatory Visit (INDEPENDENT_AMBULATORY_CARE_PROVIDER_SITE_OTHER): Payer: Medicare Other | Admitting: *Deleted

## 2017-09-05 ENCOUNTER — Encounter (INDEPENDENT_AMBULATORY_CARE_PROVIDER_SITE_OTHER): Payer: Self-pay

## 2017-09-05 DIAGNOSIS — Z5181 Encounter for therapeutic drug level monitoring: Secondary | ICD-10-CM | POA: Diagnosis not present

## 2017-09-05 DIAGNOSIS — I236 Thrombosis of atrium, auricular appendage, and ventricle as current complications following acute myocardial infarction: Secondary | ICD-10-CM

## 2017-09-05 DIAGNOSIS — Z7901 Long term (current) use of anticoagulants: Secondary | ICD-10-CM | POA: Diagnosis not present

## 2017-09-05 LAB — POCT INR: INR: 1.7

## 2017-09-17 ENCOUNTER — Telehealth: Payer: Self-pay | Admitting: *Deleted

## 2017-09-17 NOTE — Telephone Encounter (Signed)
Patient with diagnosis of left mural thrombus and stroke on warfarin for anticoagulation.    Procedure: dental care (not specified) Date of procedure: TBD  CHADS2 score of 5 (CHF, HTN, DM2, stroke/tia x 2);  CHADS2-VASc score of  6 (CHF, HTN, DM2, stroke/tia x 2, female)  CrCl 80 (based on SCr from Dec 2017) Platelet count 183 (Dec 2017)  We do not recommend holding warfarin for general dentistry (cleanings, fillings, crowns, root canal, or single tooth extractions).  Because of her history of stroke, if she is to have more extensive dental work that would require her being off warfarin, she would need to be bridged with enoxaparin (Lovenox) to decrease risk of future strokes.  For any such procedure you will need to send a specific request of the involved procedure and how many days you feel the patient should be off warfarin (5 days is maximum).  We will need to be sure that we have ample time to arrange bridging with the patient.    Thank you,  Tommy Medal PharmD CPP [place]    (faxed to [facility] 09/17/17)

## 2017-09-17 NOTE — Telephone Encounter (Signed)
   [facility] Pre-operative Risk Assessment    Request for surgical clearance:  1. What type of surgery is being performed? Dental care  2. When is this surgery scheduled? Awaiting clearance  3. Are there any medications that need to be held prior to surgery and how long?:Cardiac/Medical Clearance: Blood thinner  4. Practice name and name of physician performing surgery? [facility] [phone]  5. What is your office phone and fax number? PH: [phone] FX: [phone]  2. Anesthesia type (None, local, MAC, general) ? N/A   Khristian Phillippi R 09/17/2017, 11:34 AM  _________________________________________________________________   (provider comments below)

## 2017-09-19 ENCOUNTER — Ambulatory Visit (INDEPENDENT_AMBULATORY_CARE_PROVIDER_SITE_OTHER): Payer: Medicare Other | Admitting: *Deleted

## 2017-09-19 DIAGNOSIS — Z5181 Encounter for therapeutic drug level monitoring: Secondary | ICD-10-CM | POA: Diagnosis not present

## 2017-09-19 DIAGNOSIS — Z7901 Long term (current) use of anticoagulants: Secondary | ICD-10-CM | POA: Diagnosis not present

## 2017-09-19 DIAGNOSIS — I236 Thrombosis of atrium, auricular appendage, and ventricle as current complications following acute myocardial infarction: Secondary | ICD-10-CM

## 2017-09-19 LAB — POCT INR: INR: 2

## 2017-10-10 ENCOUNTER — Ambulatory Visit (INDEPENDENT_AMBULATORY_CARE_PROVIDER_SITE_OTHER): Payer: Medicare Other | Admitting: *Deleted

## 2017-10-10 DIAGNOSIS — Z7901 Long term (current) use of anticoagulants: Secondary | ICD-10-CM | POA: Diagnosis not present

## 2017-10-10 DIAGNOSIS — I236 Thrombosis of atrium, auricular appendage, and ventricle as current complications following acute myocardial infarction: Secondary | ICD-10-CM | POA: Diagnosis not present

## 2017-10-10 DIAGNOSIS — Z5181 Encounter for therapeutic drug level monitoring: Secondary | ICD-10-CM

## 2017-10-10 LAB — POCT INR: INR: 2.1

## 2017-10-10 NOTE — Patient Instructions (Signed)
Continnue taking 1.5 pills everyday except 2 pills on Fridays. Recheck in 4 weeks.

## 2017-10-27 ENCOUNTER — Other Ambulatory Visit: Payer: Self-pay | Admitting: Physician Assistant

## 2017-10-27 MED ORDER — WARFARIN SODIUM 5 MG PO TABS: ORAL_TABLET | ORAL | 3 refills | Status: DC

## 2017-11-07 ENCOUNTER — Ambulatory Visit (INDEPENDENT_AMBULATORY_CARE_PROVIDER_SITE_OTHER): Payer: Medicaid Other | Admitting: Pharmacist

## 2017-11-07 DIAGNOSIS — Z7901 Long term (current) use of anticoagulants: Secondary | ICD-10-CM

## 2017-11-07 DIAGNOSIS — I236 Thrombosis of atrium, auricular appendage, and ventricle as current complications following acute myocardial infarction: Secondary | ICD-10-CM | POA: Diagnosis not present

## 2017-11-07 DIAGNOSIS — Z5181 Encounter for therapeutic drug level monitoring: Secondary | ICD-10-CM

## 2017-11-07 LAB — POCT INR: INR: 1.6

## 2017-11-07 NOTE — Patient Instructions (Signed)
Description   Take 2.5 tablets today then continue taking 1.5 pills everyday except 2 pills on Fridays. Recheck in 2 weeks.

## 2017-11-13 ENCOUNTER — Other Ambulatory Visit: Payer: Self-pay | Admitting: Internal Medicine

## 2017-11-21 ENCOUNTER — Ambulatory Visit (INDEPENDENT_AMBULATORY_CARE_PROVIDER_SITE_OTHER): Payer: Medicare Other | Admitting: *Deleted

## 2017-11-21 DIAGNOSIS — Z5181 Encounter for therapeutic drug level monitoring: Secondary | ICD-10-CM | POA: Diagnosis not present

## 2017-11-21 DIAGNOSIS — Z7901 Long term (current) use of anticoagulants: Secondary | ICD-10-CM

## 2017-11-21 DIAGNOSIS — I236 Thrombosis of atrium, auricular appendage, and ventricle as current complications following acute myocardial infarction: Secondary | ICD-10-CM | POA: Diagnosis not present

## 2017-11-21 LAB — POCT INR: INR: 1.8

## 2017-11-21 NOTE — Patient Instructions (Signed)
Description   Take 2.5 tablets today then start  taking 1.5 pills everyday except 2 pills on Mondays and Fridays. Recheck in 2 weeks.

## 2017-12-05 ENCOUNTER — Ambulatory Visit (INDEPENDENT_AMBULATORY_CARE_PROVIDER_SITE_OTHER): Payer: Medicare Other | Admitting: *Deleted

## 2017-12-05 DIAGNOSIS — Z5181 Encounter for therapeutic drug level monitoring: Secondary | ICD-10-CM | POA: Diagnosis not present

## 2017-12-05 DIAGNOSIS — Z7901 Long term (current) use of anticoagulants: Secondary | ICD-10-CM

## 2017-12-05 DIAGNOSIS — I236 Thrombosis of atrium, auricular appendage, and ventricle as current complications following acute myocardial infarction: Secondary | ICD-10-CM | POA: Diagnosis not present

## 2017-12-05 LAB — POCT INR: INR: 3.5

## 2017-12-05 NOTE — Patient Instructions (Signed)
Description   Skip tomorrow's dose, then taking 1.5 pills everyday except 2 pills on Mondays and Fridays. Recheck in 2 weeks.

## 2017-12-20 ENCOUNTER — Other Ambulatory Visit: Payer: Self-pay | Admitting: Internal Medicine

## 2017-12-22 NOTE — Telephone Encounter (Signed)
Rx request sent to pharmacy.  

## 2017-12-26 DIAGNOSIS — K529 Noninfective gastroenteritis and colitis, unspecified: Secondary | ICD-10-CM

## 2017-12-26 HISTORY — DX: Noninfective gastroenteritis and colitis, unspecified: K52.9

## 2017-12-31 ENCOUNTER — Ambulatory Visit (INDEPENDENT_AMBULATORY_CARE_PROVIDER_SITE_OTHER): Payer: Medicare Other | Admitting: *Deleted

## 2017-12-31 DIAGNOSIS — Z7901 Long term (current) use of anticoagulants: Secondary | ICD-10-CM | POA: Diagnosis not present

## 2017-12-31 DIAGNOSIS — I236 Thrombosis of atrium, auricular appendage, and ventricle as current complications following acute myocardial infarction: Secondary | ICD-10-CM

## 2017-12-31 DIAGNOSIS — Z5181 Encounter for therapeutic drug level monitoring: Secondary | ICD-10-CM | POA: Diagnosis not present

## 2017-12-31 LAB — POCT INR: INR: 1.3

## 2017-12-31 NOTE — Patient Instructions (Signed)
Description   Today and tomorrow take 2 tablets then continue taking 1.5 pills everyday except 2 pills on Mondays and Fridays. Recheck in 1 week.

## 2018-01-02 ENCOUNTER — Other Ambulatory Visit: Payer: Self-pay | Admitting: Internal Medicine

## 2018-01-09 ENCOUNTER — Encounter (HOSPITAL_COMMUNITY): Payer: Self-pay | Admitting: Radiology

## 2018-01-09 ENCOUNTER — Emergency Department (HOSPITAL_COMMUNITY): Payer: Medicare Other

## 2018-01-09 ENCOUNTER — Emergency Department (HOSPITAL_COMMUNITY)
Admission: EM | Admit: 2018-01-09 | Discharge: 2018-01-09 | Disposition: A | Discharge status: Home / Self Care | Payer: Medicare Other | Source: Home / Self Care | Attending: Emergency Medicine | Admitting: Emergency Medicine

## 2018-01-09 ENCOUNTER — Other Ambulatory Visit: Payer: Self-pay

## 2018-01-09 DIAGNOSIS — I5022 Chronic systolic (congestive) heart failure: Secondary | ICD-10-CM | POA: Insufficient documentation

## 2018-01-09 DIAGNOSIS — Z7982 Long term (current) use of aspirin: Secondary | ICD-10-CM

## 2018-01-09 DIAGNOSIS — R112 Nausea with vomiting, unspecified: Secondary | ICD-10-CM

## 2018-01-09 DIAGNOSIS — Z7901 Long term (current) use of anticoagulants: Secondary | ICD-10-CM | POA: Insufficient documentation

## 2018-01-09 DIAGNOSIS — Z79899 Other long term (current) drug therapy: Secondary | ICD-10-CM

## 2018-01-09 DIAGNOSIS — E119 Type 2 diabetes mellitus without complications: Secondary | ICD-10-CM

## 2018-01-09 DIAGNOSIS — Z7984 Long term (current) use of oral hypoglycemic drugs: Secondary | ICD-10-CM | POA: Insufficient documentation

## 2018-01-09 DIAGNOSIS — K529 Noninfective gastroenteritis and colitis, unspecified: Secondary | ICD-10-CM

## 2018-01-09 DIAGNOSIS — R1084 Generalized abdominal pain: Secondary | ICD-10-CM

## 2018-01-09 DIAGNOSIS — I110 Hypertensive heart disease with heart failure: Secondary | ICD-10-CM | POA: Insufficient documentation

## 2018-01-09 DIAGNOSIS — A09 Infectious gastroenteritis and colitis, unspecified: Secondary | ICD-10-CM | POA: Diagnosis not present

## 2018-01-09 DIAGNOSIS — Z8673 Personal history of transient ischemic attack (TIA), and cerebral infarction without residual deficits: Secondary | ICD-10-CM | POA: Insufficient documentation

## 2018-01-09 LAB — CBC WITH DIFFERENTIAL/PLATELET
BASOS PCT: 0 %
Basophils Absolute: 0 10*3/uL (ref 0.0–0.1)
EOS ABS: 0 10*3/uL (ref 0.0–0.7)
EOS PCT: 0 %
HCT: 37.9 % (ref 36.0–46.0)
HEMOGLOBIN: 12.1 g/dL (ref 12.0–15.0)
Lymphocytes Relative: 7 %
Lymphs Abs: 0.5 10*3/uL — ABNORMAL LOW (ref 0.7–4.0)
MCH: 21.4 pg — AB (ref 26.0–34.0)
MCHC: 31.9 g/dL (ref 30.0–36.0)
MCV: 67.1 fL — AB (ref 78.0–100.0)
MONO ABS: 0.4 10*3/uL (ref 0.1–1.0)
Monocytes Relative: 5 %
NEUTROS ABS: 6.8 10*3/uL (ref 1.7–7.7)
Neutrophils Relative %: 88 %
PLATELETS: 190 10*3/uL (ref 150–400)
RBC: 5.65 MIL/uL — ABNORMAL HIGH (ref 3.87–5.11)
RDW: 14.6 % (ref 11.5–15.5)
WBC: 7.7 10*3/uL (ref 4.0–10.5)

## 2018-01-09 LAB — COMPREHENSIVE METABOLIC PANEL
ALBUMIN: 3.8 g/dL (ref 3.5–5.0)
ALT: 36 U/L (ref 14–54)
AST: 49 U/L — AB (ref 15–41)
Alkaline Phosphatase: 51 U/L (ref 38–126)
Anion gap: 16 — ABNORMAL HIGH (ref 5–15)
BUN: 31 mg/dL — AB (ref 6–20)
CHLORIDE: 101 mmol/L (ref 101–111)
CO2: 23 mmol/L (ref 22–32)
CREATININE: 1.09 mg/dL — AB (ref 0.44–1.00)
Calcium: 9.6 mg/dL (ref 8.9–10.3)
GFR calc non Af Amer: 53 mL/min — ABNORMAL LOW (ref >60)
Glucose, Bld: 264 mg/dL — ABNORMAL HIGH (ref 65–99)
Potassium: 3.6 mmol/L (ref 3.5–5.1)
SODIUM: 140 mmol/L (ref 135–145)
Total Bilirubin: 0.6 mg/dL (ref 0.3–1.2)
Total Protein: 8 g/dL (ref 6.5–8.1)

## 2018-01-09 LAB — LIPASE, BLOOD: Lipase: 26 U/L (ref 11–51)

## 2018-01-09 LAB — CBG MONITORING, ED: GLUCOSE-CAPILLARY: 246 mg/dL — AB (ref 65–99)

## 2018-01-09 MED ORDER — SODIUM CHLORIDE 0.9 % IV BOLUS (SEPSIS)
500.0000 mL | Freq: Once | INTRAVENOUS | Status: AC
  Administered 2018-01-09: 500 mL via INTRAVENOUS

## 2018-01-09 MED ORDER — METOCLOPRAMIDE HCL 5 MG/ML IJ SOLN
10.0000 mg | Freq: Once | INTRAMUSCULAR | Status: AC
  Administered 2018-01-09: 10 mg via INTRAVENOUS
  Filled 2018-01-09: qty 2

## 2018-01-09 MED ORDER — CIPROFLOXACIN HCL 500 MG PO TABS
500.0000 mg | ORAL_TABLET | Freq: Once | ORAL | Status: AC
  Administered 2018-01-09: 500 mg via ORAL
  Filled 2018-01-09: qty 1

## 2018-01-09 MED ORDER — SODIUM CHLORIDE 0.9 % IV BOLUS (SEPSIS): 1000.0000 mL | Freq: Once | INTRAVENOUS | Status: DC

## 2018-01-09 MED ORDER — METRONIDAZOLE 500 MG PO TABS: 500.0000 mg | ORAL_TABLET | Freq: Two times a day (BID) | ORAL | 0 refills | Status: DC

## 2018-01-09 MED ORDER — IOPAMIDOL (ISOVUE-300) INJECTION 61%
INTRAVENOUS | Status: AC
  Administered 2018-01-09: 75 mL
  Filled 2018-01-09: qty 75

## 2018-01-09 MED ORDER — CIPROFLOXACIN HCL 500 MG PO TABS: 500.0000 mg | ORAL_TABLET | Freq: Two times a day (BID) | ORAL | 0 refills | Status: DC

## 2018-01-09 MED ORDER — PROMETHAZINE HCL 12.5 MG PO TABS: 12.5000 mg | ORAL_TABLET | Freq: Four times a day (QID) | ORAL | 0 refills | Status: DC | PRN

## 2018-01-09 MED ORDER — MORPHINE SULFATE (PF) 4 MG/ML IV SOLN
4.0000 mg | Freq: Once | INTRAVENOUS | Status: AC
  Administered 2018-01-09: 4 mg via INTRAVENOUS
  Filled 2018-01-09: qty 1

## 2018-01-09 MED ORDER — METRONIDAZOLE 500 MG PO TABS
500.0000 mg | ORAL_TABLET | Freq: Once | ORAL | Status: AC
  Administered 2018-01-09: 500 mg via ORAL
  Filled 2018-01-09: qty 1

## 2018-01-09 NOTE — ED Triage Notes (Signed)
Pt brought in by [facility] from home for generalized abdominal pain that started yesterday. Pt also endorses NVD x1 day. Pt states pain worsens on palpation and states she has thrown up and had diarrhea "too many times to count". Pt is pale on assessment. Pt given 4mg  zofram ODT PTA and states it helped her nausea. Pt has no other c/o at this time.

## 2018-01-09 NOTE — ED Provider Notes (Signed)
[facility] EMERGENCY DEPARTMENT Provider Note   CSN: [id number] Arrival date & time: 01/09/18  1648     History   Chief Complaint Chief Complaint  Patient presents with  . Abdominal Pain    HPI  Kristen Lozano is a 62 y.o. Female with a history of CHF, hypertension, hyperlipidemia, diabetes, stroke, who presents to the ED via EMS for evaluation of generalized abdominal pain that started yesterday.  Patient also reports 2 days of nausea and vomiting, and numerous episodes of diarrhea that started today, patient denies any blood in the stool or vomit.  Patient reports she is been unable to keep down fluids or her medications.  Patient was given 4 mg of ODT Zofran prior to arrival that helped with her nausea, but on evaluation patient has began actively vomiting again.  Patient reports constant generalized abdominal pain that is described as a dull ache, patient unable to localize pain.  Patient denies fevers or chills, no urinary symptoms.  Does have history of diabetes, reports her blood sugar earlier today was around 120.  Patient denies using recent antibiotics.  Denies chest pain or shortness of breath.  Patient does have history of a stroke, on review of electronic medical record, appears to have some continued intermittent aphasia, this is confirmed with family members on arrival, who feel that the patient is at her baseline mental status.      Past Medical History:  Diagnosis Date  . Allergic rhinitis    Requires cetirizine, singulair, and fluticasone.  . Anxiety    Has been on Xanax since 2009. Uses it for stress, anxiety, and insomnia. No contract yet.  . Arthritis   . CHF (congestive heart failure) (HCC)    EF 35% after stroke, presumed ischemic  . Chronic pain    Has OA of knees B. No Xrays in echart. Not requiring narcotics.  . Depression   . Diabetes mellitus    Type 2, non insulin dependent. Was dx'd prior to 2008.  Marland Kitchen Hyperglycemia   .  Hyperlipemia   . Hypertension   . Sickle cell trait ([place])   . 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 Chronic systolic heart failure ([place]) 12/16/2016  . Monitoring for long-term anticoagulant use 11/14/2016  . DKA (diabetic ketoacidoses) ([place]) 11/01/2016  . DM (diabetes mellitus) type 2, uncontrolled, with ketoacidosis ([place]) 11/01/2016  . Elevated troponin   . Cardiomyopathy, ischemic   . LV (left ventricular) mural thrombus following MI ([place])   . Prolonged Q-T interval on ECG 10/28/2016  . Cognitive deficit due to recent stroke 10/18/2014  . 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 Abnormal stress test 10/17/2014  . Dysphagia, pharyngoesophageal phase 09/23/2014  . Abnormal CT scan 09/23/2014  . Dilated bile duct 09/23/2014  . Urinary retention 09/07/2014  . 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 Stroke ([place]) 09/05/2014  . CVA (cerebral infarction) 09/05/2014  . Non compliance w medication regimen 08/10/2012  . Fatigue 02/18/2012  . Essential hypertension 03/22/2011  . Healthcare maintenance 03/22/2011  . Chronic pain   . ALLERGIC RHINITIS, SEASONAL 03/14/2008  . SICKLE CELL TRAIT 10/31/2006  . Anxiety state 10/31/2006  . DEPRESSION 10/31/2006    Past Surgical History:  Procedure Laterality Date  . TEE WITHOUT CARDIOVERSION N/A 09/06/2014   Procedure: TRANSESOPHAGEAL ECHOCARDIOGRAM (TEE);  Surgeon: Nadean Corwin.  Hilty, MD;  Location: [facility];  Service: Cardiovascular;  Laterality: N/A;    OB History    No data available       Home Medications    Prior to Admission medications   Medication Sig Start Date End Date Taking? Authorizing Provider  aspirin 81 MG tablet Take 1 tablet (81 mg total) by mouth daily. 11/11/16  Yes Imogene Burn, PA-C  atorvastatin (LIPITOR) 20 MG tablet Take 1 tablet (20 mg total) by mouth  daily at 6 PM. Please schedule appointment for refills. 01/02/18  Yes Hilty, Nadean Corwin, MD  carvedilol (COREG) 25 MG tablet Take 1 tablet (25 mg total) by mouth 2 (two) times daily with a meal. Please schedule appointment for refills. 01/02/18  Yes Hilty, Nadean Corwin, MD  lisinopril (PRINIVIL,ZESTRIL) 10 MG tablet Take 1 tablet (10 mg total) by mouth daily. 08/18/17  Yes Hilty, Nadean Corwin, MD  lisinopril (PRINIVIL,ZESTRIL) 10 MG tablet Take 1 tablet (10 mg total) by mouth daily. 09/02/17  Yes Imogene Burn, PA-C  metFORMIN (GLUCOPHAGE) 1000 MG tablet Take 1 tablet (1,000 mg total) by mouth 2 (two) times daily with a meal. 11/01/16  Yes Dessa Phi, DO  sitaGLIPtin (JANUVIA) 100 MG tablet Take 1 tablet (100 mg total) by mouth daily. 11/01/16  Yes Dessa Phi, DO  TRADJENTA 5 MG TABS tablet Take 5 mg by mouth daily. 11/01/16  Yes [provider]  TRULICITY 6.64 QI/3.4VQ SOPN Inject 0.75 mg as directed once a week. Tuesday 12/31/17  Yes [provider]  warfarin (COUMADIN) 5 MG tablet Tale 1 tablet (5mg ) daily except 1.5  Tablets (7.5mg ) every Monday OR as directed by coumadin clinic Patient taking differently: Take 5 mg by mouth one time only at 6 PM. Tale 1 tablet (5mg ) daily   1.5  Tablets (7.5mg ) every Monday and Friday 10/27/17  Yes Hilty, Nadean Corwin, MD  carvedilol (COREG) 25 MG tablet Take 1 tablet (25 mg total) by mouth 2 (two) times daily with a meal. Patient not taking: Reported on 01/09/2018 08/26/17   Pixie Casino, MD  ciprofloxacin (CIPRO) 500 MG tablet Take 1 tablet (500 mg total) by mouth 2 (two) times daily. One po bid x 7 days 01/09/18   Jacqlyn Larsen, PA-C  lisinopril (PRINIVIL,ZESTRIL) 10 MG tablet Take 1 tablet (10 mg total) by mouth daily. Patient not taking: Reported on 01/09/2018 08/19/17   Pixie Casino, MD  metroNIDAZOLE (FLAGYL) 500 MG tablet Take 1 tablet (500 mg total) by mouth 2 (two) times daily. One po bid x 7 days 01/09/18   Jacqlyn Larsen, PA-C    promethazine (PHENERGAN) 12.5 MG tablet Take 1 tablet (12.5 mg total) by mouth every 6 (six) hours as needed for nausea or vomiting. 01/09/18   Jacqlyn Larsen, PA-C    Family History Family History  Problem Relation Age of Onset  . Diabetes Mother   . Hypertension Mother   . Heart disease Father   . Heart attack Father   . Hypertension Father   . Diabetes Father   . Ovarian cancer Sister   . Liver cancer Sister   . Sickle cell anemia Daughter   . Schizophrenia Daughter   . Hypertension Sister   . Hypertension Brother   . Hypertension Daughter   . Kidney disease Sister        x2  . Kidney disease Brother   . Stroke Neg Hx   . Esophageal cancer Neg Hx   . Colon  cancer Neg Hx   . Colon polyps Neg Hx     Social History Social History   Tobacco Use  . Smoking status: Never Smoker  . Smokeless tobacco: Never Used  Substance Use Topics  . Alcohol use: No    Alcohol/week: 0.0 oz  . Drug use: No     Allergies   Patient has no known allergies.   Review of Systems Review of Systems  Constitutional: Negative for chills and fever.  HENT: Negative for congestion, rhinorrhea and sore throat.   Eyes: Negative for visual disturbance.  Respiratory: Negative for cough and shortness of breath.   Cardiovascular: Negative for chest pain.  Gastrointestinal: Positive for abdominal pain, diarrhea, nausea and vomiting. Negative for blood in stool.  Genitourinary: Negative for dysuria and frequency.  Musculoskeletal: Negative for arthralgias and myalgias.  Skin: Negative for color change, pallor and rash.  Neurological: Negative for dizziness, weakness and light-headedness.     Physical Exam Updated Vital Signs BP 122/68   Pulse (!) 111   Resp 16   Ht 5\' 3"  (1.6 m)   Wt 49 kg (108 lb)   SpO2 98%   BMI 19.13 kg/m   Physical Exam  Constitutional: She is oriented to person, place, and time. She appears well-developed and well-nourished. No distress.  Patient is chronically  ill-appearing, but in no acute distress.  HENT:  Head: Normocephalic and atraumatic.  Eyes: EOM are normal. Pupils are equal, round, and reactive to light. Right eye exhibits no discharge. Left eye exhibits no discharge.  Neck: Neck supple.  Cardiovascular: Normal rate, regular rhythm, normal heart sounds and intact distal pulses.  Pulmonary/Chest: Effort normal and breath sounds normal. No stridor. No respiratory distress. She has no wheezes. She has no rales.  Respirations equal and unlabored, patient able to speak in full sentences, lungs clear to auscultation bilaterally  Abdominal: Soft. Bowel sounds are normal.  Abdomen is soft and bowel sounds are present throughout, nondistended, previous surgical scars present, patient unable to recall abdominal surgeries.  Periumbilical and right-sided abdominal tenderness with mild guarding, no rebound tenderness or obvious peritonitis.  Musculoskeletal: She exhibits no edema or deformity.  Neurological: She is alert and oriented to person, place, and time. Coordination normal.  Pt able to follow commands. Does frequently have to have questions or commands repeated. Patient able to move all extremities without difficulty, coordination intact.  Skin: Skin is warm and dry. Capillary refill takes less than 2 seconds. She is not diaphoretic.  Psychiatric: She has a normal mood and affect. Her behavior is normal.  Nursing note and vitals reviewed.    ED Treatments / Results  Labs (all labs ordered are listed, but only abnormal results are displayed) Labs Reviewed  CBC WITH DIFFERENTIAL/PLATELET - Abnormal; Notable for the following components:      Result Value   RBC 5.65 (*)    MCV 67.1 (*)    MCH 21.4 (*)    Lymphs Abs 0.5 (*)    All other components within normal limits  COMPREHENSIVE METABOLIC PANEL - Abnormal; Notable for the following components:   Glucose, Bld 264 (*)    BUN 31 (*)    Creatinine, Ser 1.09 (*)    AST 49 (*)    GFR calc  non Af Amer 53 (*)    Anion gap 16 (*)    All other components within normal limits  CBG MONITORING, ED - Abnormal; Notable for the following components:   Glucose-Capillary 246 (*)  All other components within normal limits  GASTROINTESTINAL PANEL BY PCR, STOOL (REPLACES STOOL CULTURE)  LIPASE, BLOOD    EKG  EKG Interpretation  Date/Time:  Friday January 09 2018 18:11:22 EST Ventricular Rate:  106 PR Interval:    QRS Duration: 83 QT Interval:  334 QTC Calculation: 444 R Axis:   63 Text Interpretation:  Sinus tachycardia Anteroseptal infarct, age indeterminate ST depression, consider subendocardial injury RESOLVED SINCE PREVIOUS Confirmed by Blanchie Dessert [phone]) on 01/09/2018 6:18:12 PM       Radiology Ct Abdomen Pelvis W Contrast  Result Date: 01/09/2018 CLINICAL DATA:  62 year old female with generalized abdominal pain, nausea vomiting diarrhea beginning yesterday. EXAM: CT ABDOMEN AND PELVIS WITH CONTRAST TECHNIQUE: Multidetector CT imaging of the abdomen and pelvis was performed using the standard protocol following bolus administration of intravenous contrast. CONTRAST:  48mL ISOVUE-300 IOPAMIDOL (ISOVUE-300) INJECTION 61% COMPARISON:  Abdomen MRI and MRCP 03/08/2015. CT chest abdomen and Pelvis 09/07/2014. FINDINGS: Lower chest: Mild respiratory motion at the lung bases which appear clear. Stable cardiac size since 2015, borderline to mild cardiac enlargement. No pericardial or pleural effusion. Hepatobiliary: Negative gallbladder. Stable biliary tree. Normal liver enhancement. Pancreas: Negative. Spleen: Negative. Adrenals/Urinary Tract: Normal adrenal glands. Scattered areas of left greater than right chronic renal cortical scarring corresponds to areas of pyelonephritis on the 2015 comparison. Bilateral renal enhancement and contrast excretion today is within normal limits. No perinephric stranding or hydronephrosis. Decompressed and unremarkable urinary bladder.  Stomach/Bowel: The rectosigmoid colon appears decompressed but at the same time partially fluid-filled with suggestion of generalized mild wall thickening (series 3, image 67). No mesenteric stranding is associated. There is a similar appearance of the descending colon. The transverse colon is completely decompressed, not containing fluid. The ascending colon and cecum demonstrate no definite wall thickening, although there is an unusual appearance of lobulated fluid within the ascending colon (coronal image 50). As elsewhere, no colonic mesenteric stranding is identified. The appendix is normal, best seen on coronal images. Negative terminal ileum. Numerous decompressed small bowel loops in the pelvis are within normal limits. Oral contrast has only reached the proximal jejunum. No dilated small bowel. Negative stomach and duodenum. No abdominal free air or free fluid. Vascular/Lymphatic: Minimal atherosclerosis. Major arterial structures in the abdomen and pelvis are patent. Portal venous system is patent. No lymphadenopathy. Reproductive: Negative. Other: No pelvic free fluid. Musculoskeletal: Negative. IMPRESSION: 1. Generalized mild to moderate wall thickening throughout the colon suspicious for acute infectious colitis in this clinical setting. Superimposed unusual appearance of fluid or fluid collection within the ascending colon. Recommend follow-up evaluation, preferably with colonoscopy or virtual colonoscopy after the acute illness has resolved. 2. No associated bowel obstruction or abdominal free fluid. No other acute or inflammatory findings in the abdomen or pelvis. 3. Chronic renal scarring related to 2015 pyelonephritis. Electronically Signed   By: Genevie Ann M.D.   On: 01/09/2018 20:25    Procedures Procedures (including critical care time)  Medications Ordered in ED Medications  morphine 4 MG/ML injection 4 mg (4 mg Intravenous Given 01/09/18 1812)  sodium chloride 0.9 % bolus 500 mL (0 mLs  Intravenous Stopped 01/09/18 1904)  iopamidol (ISOVUE-300) 61 % injection (75 mLs  Contrast Given 01/09/18 2001)  metoCLOPramide (REGLAN) injection 10 mg (10 mg Intravenous Given 01/09/18 2038)  sodium chloride 0.9 % bolus 500 mL (0 mLs Intravenous Stopped 01/09/18 2246)  ciprofloxacin (CIPRO) tablet 500 mg (500 mg Oral Given 01/09/18 2136)  metroNIDAZOLE (FLAGYL) tablet 500 mg (500 mg Oral  Given 01/09/18 2136)     Initial Impression / Assessment and Plan / ED Course  I have reviewed the triage vital signs and the nursing notes.  Pertinent labs & imaging results that were available during my care of the patient were reviewed by me and considered in my medical decision making (see chart for details).  Patient presents to the ED for evaluation of abdominal pain, with associated nausea and vomiting, and numerous episodes of loose stools today, nonbloody.  No recent antibiotic use.  No associated fevers or chills, no urinary symptoms.  On exam patient is tachycardic, but afebrile, initially a bit hypertensive, but blood pressure improved.  Patient is chronically ill-appearing, but in no acute distress.  Abdomen tender to palpation periumbilically and on the right side some guarding.  Will get labs and CT abdomen pelvis.  Will give 500 mL fluid bolus, given patient's cardiac history do not want to overload with fluids, although feel that patient is probably a bit dehydrated given symptoms.  Will also give morphine for pain and Reglan since patient has continued to have episodes of emesis.  Patient does have history of prolonged QT, but QT C is 444 today.   Labs show no leukocytosis and normal hemoglobin, no electrolyte derangements requiring intervention, creatinine is slightly elevated at 1.09, but I feel this is likely due to hydration and will be corrected with IV fluids being given.  Normal kidney function and normal lipase.  CT shows generalized mild to moderate wall thickening of the colon, suggestive of  acute infectious colitis.  There is some evidence of superimposed fluid within the ascending colon, and radiology recommends follow-up with a colonoscopy after acute illness has resolved.  There is no associated bowel obstruction or abdominal free fluid and no other acute findings.  Discussed these results with the patient and her family members, patient continues to be tachycardic, but reports she is feeling better overall, will give additional 500 mL fluid bolus, ensure that patient is able to tolerate p.o. fluids and antibiotics.  Will treat colitis with Cipro and Flagyl.  Patient tolerating p.o. fluids with no further episodes of emesis patient also able to tolerate antibiotics.  Patient ambulated in the department without difficulty and reports she is feeling much better.  Patient is still mildly tachycardic with heart rate around 110, but patient vomited up her carvedilol this morning, and has not had her evening dose which could easily explain the patient's tachycardia.  At this time patient wishes to go home, and is stable for discharge.  Patient will be discharged with 1 week of Cipro and Flagyl to treat colitis, patient will also be sent home with Phenergan for nausea.  It is currently on warfarin and Cipro can interact with this, patient will need to follow-up at the Coumadin clinic early next week for an INR check she and her family expressed understanding of this.  Strict return precautions were discussed with the patient and family members.  She is to follow-up with her primary care doctor next week, referral also provided to low-power gastroenterology for future colonoscopy as recommended by radiology.  Patient seen and evaluated by Dr. Maryan Rued as well who is in agreement with plan   Final Clinical Impressions(s) / ED Diagnoses   Final diagnoses:  Colitis  Non-intractable vomiting with nausea, unspecified vomiting type  Generalized abdominal pain    ED Discharge Orders        Ordered      metroNIDAZOLE (FLAGYL) 500 MG tablet  2 times daily     01/09/18 2302    ciprofloxacin (CIPRO) 500 MG tablet  2 times daily     01/09/18 2302    promethazine (PHENERGAN) 12.5 MG tablet  Every 6 hours PRN     01/09/18 2302       Jacqlyn Larsen, PA-C 01/10/18 0092    Blanchie Dessert, MD 01/10/18 [phone]

## 2018-01-09 NOTE — ED Notes (Signed)
Patient transported to CT 

## 2018-01-09 NOTE — ED Notes (Signed)
Pt. Ambulated throughout the hallway with no problem, no discomfort or nausea stated.

## 2018-01-09 NOTE — Discharge Instructions (Signed)
Your symptoms are caused by colitis, you will need to take the antibiotics as prescribed for the next week, may use Phenergan as needed for nausea, please be aware that this medication can make you drowsy.  Ciprofloxacin can interact with your Coumadin, please follow-up in the Coumadin clinic early next week to have your INR checked.  Please follow-up with your primary care doctor early next week, I have also provided a referral to a [facility] gastroenterology, you will need to follow-up and will need a colonoscopy once the infection has cleared up.  If you have persistent nausea and vomiting and or unable to keep down your medications or fluids, you have worsening pain, diarrhea is persisting and you are becoming more dehydrated awake, large volumes of blood in the stool or other new or concerning symptoms please return to the ED for reevaluation.

## 2018-01-10 ENCOUNTER — Encounter (HOSPITAL_COMMUNITY): Payer: Self-pay | Admitting: Emergency Medicine

## 2018-01-10 ENCOUNTER — Inpatient Hospital Stay (HOSPITAL_COMMUNITY)
Admission: EM | Admit: 2018-01-10 | Discharge: 2018-01-15 | DRG: 392 | Disposition: A | Discharge status: Home / Self Care | Payer: Medicare Other | Attending: Internal Medicine | Admitting: Internal Medicine

## 2018-01-10 ENCOUNTER — Other Ambulatory Visit: Payer: Self-pay

## 2018-01-10 DIAGNOSIS — I236 Thrombosis of atrium, auricular appendage, and ventricle as current complications following acute myocardial infarction: Secondary | ICD-10-CM | POA: Diagnosis present

## 2018-01-10 DIAGNOSIS — E119 Type 2 diabetes mellitus without complications: Secondary | ICD-10-CM | POA: Diagnosis present

## 2018-01-10 DIAGNOSIS — Z79899 Other long term (current) drug therapy: Secondary | ICD-10-CM

## 2018-01-10 DIAGNOSIS — G8929 Other chronic pain: Secondary | ICD-10-CM | POA: Diagnosis present

## 2018-01-10 DIAGNOSIS — F4321 Adjustment disorder with depressed mood: Secondary | ICD-10-CM | POA: Diagnosis present

## 2018-01-10 DIAGNOSIS — E861 Hypovolemia: Secondary | ICD-10-CM | POA: Diagnosis present

## 2018-01-10 DIAGNOSIS — N179 Acute kidney failure, unspecified: Secondary | ICD-10-CM | POA: Diagnosis present

## 2018-01-10 DIAGNOSIS — A09 Infectious gastroenteritis and colitis, unspecified: Principal | ICD-10-CM | POA: Diagnosis present

## 2018-01-10 DIAGNOSIS — E860 Dehydration: Secondary | ICD-10-CM | POA: Diagnosis present

## 2018-01-10 DIAGNOSIS — R791 Abnormal coagulation profile: Secondary | ICD-10-CM | POA: Diagnosis present

## 2018-01-10 DIAGNOSIS — E561 Deficiency of vitamin K: Secondary | ICD-10-CM | POA: Diagnosis present

## 2018-01-10 DIAGNOSIS — K920 Hematemesis: Secondary | ICD-10-CM | POA: Diagnosis present

## 2018-01-10 DIAGNOSIS — D573 Sickle-cell trait: Secondary | ICD-10-CM | POA: Diagnosis present

## 2018-01-10 DIAGNOSIS — E876 Hypokalemia: Secondary | ICD-10-CM | POA: Diagnosis present

## 2018-01-10 DIAGNOSIS — R630 Anorexia: Secondary | ICD-10-CM | POA: Diagnosis present

## 2018-01-10 DIAGNOSIS — Z8673 Personal history of transient ischemic attack (TIA), and cerebral infarction without residual deficits: Secondary | ICD-10-CM

## 2018-01-10 DIAGNOSIS — R1310 Dysphagia, unspecified: Secondary | ICD-10-CM | POA: Diagnosis present

## 2018-01-10 DIAGNOSIS — Z681 Body mass index (BMI) 19 or less, adult: Secondary | ICD-10-CM

## 2018-01-10 DIAGNOSIS — R000 Tachycardia, unspecified: Secondary | ICD-10-CM | POA: Diagnosis present

## 2018-01-10 DIAGNOSIS — I513 Intracardiac thrombosis, not elsewhere classified: Secondary | ICD-10-CM | POA: Diagnosis present

## 2018-01-10 DIAGNOSIS — E8889 Other specified metabolic disorders: Secondary | ICD-10-CM | POA: Diagnosis present

## 2018-01-10 DIAGNOSIS — M170 Bilateral primary osteoarthritis of knee: Secondary | ICD-10-CM | POA: Diagnosis present

## 2018-01-10 DIAGNOSIS — R197 Diarrhea, unspecified: Secondary | ICD-10-CM

## 2018-01-10 DIAGNOSIS — I110 Hypertensive heart disease with heart failure: Secondary | ICD-10-CM | POA: Diagnosis present

## 2018-01-10 DIAGNOSIS — I5042 Chronic combined systolic (congestive) and diastolic (congestive) heart failure: Secondary | ICD-10-CM | POA: Diagnosis present

## 2018-01-10 DIAGNOSIS — I10 Essential (primary) hypertension: Secondary | ICD-10-CM | POA: Diagnosis present

## 2018-01-10 DIAGNOSIS — E785 Hyperlipidemia, unspecified: Secondary | ICD-10-CM | POA: Diagnosis present

## 2018-01-10 DIAGNOSIS — Z7901 Long term (current) use of anticoagulants: Secondary | ICD-10-CM

## 2018-01-10 DIAGNOSIS — Z7982 Long term (current) use of aspirin: Secondary | ICD-10-CM

## 2018-01-10 DIAGNOSIS — Z7984 Long term (current) use of oral hypoglycemic drugs: Secondary | ICD-10-CM

## 2018-01-10 HISTORY — DX: Noninfective gastroenteritis and colitis, unspecified: K52.9

## 2018-01-10 LAB — COMPREHENSIVE METABOLIC PANEL
ALT: 27 U/L (ref 14–54)
AST: 31 U/L (ref 15–41)
Albumin: 4 g/dL (ref 3.5–5.0)
Alkaline Phosphatase: 57 U/L (ref 38–126)
Anion gap: 16 — ABNORMAL HIGH (ref 5–15)
BUN: 11 mg/dL (ref 6–20)
CALCIUM: 9.6 mg/dL (ref 8.9–10.3)
CO2: 25 mmol/L (ref 22–32)
CREATININE: 0.66 mg/dL (ref 0.44–1.00)
Chloride: 98 mmol/L — ABNORMAL LOW (ref 101–111)
Glucose, Bld: 191 mg/dL — ABNORMAL HIGH (ref 65–99)
Potassium: 3.2 mmol/L — ABNORMAL LOW (ref 3.5–5.1)
Sodium: 139 mmol/L (ref 135–145)
Total Bilirubin: 0.9 mg/dL (ref 0.3–1.2)
Total Protein: 8.8 g/dL — ABNORMAL HIGH (ref 6.5–8.1)

## 2018-01-10 LAB — LIPASE, BLOOD: LIPASE: 30 U/L (ref 11–51)

## 2018-01-10 LAB — PROTIME-INR
INR: 3.12
PROTHROMBIN TIME: 31.8 s — AB (ref 11.4–15.2)

## 2018-01-10 LAB — CBC
HCT: 40.1 % (ref 36.0–46.0)
Hemoglobin: 13.4 g/dL (ref 12.0–15.0)
MCH: 22.3 pg — ABNORMAL LOW (ref 26.0–34.0)
MCHC: 33.4 g/dL (ref 30.0–36.0)
MCV: 66.6 fL — ABNORMAL LOW (ref 78.0–100.0)
PLATELETS: 204 10*3/uL (ref 150–400)
RBC: 6.02 MIL/uL — ABNORMAL HIGH (ref 3.87–5.11)
RDW: 14.4 % (ref 11.5–15.5)
WBC: 8.5 10*3/uL (ref 4.0–10.5)

## 2018-01-10 MED ORDER — SODIUM CHLORIDE 0.9 % IV BOLUS (SEPSIS)
1000.0000 mL | Freq: Once | INTRAVENOUS | Status: AC
  Administered 2018-01-10: 1000 mL via INTRAVENOUS

## 2018-01-10 MED ORDER — ONDANSETRON HCL 4 MG/2ML IJ SOLN
4.0000 mg | Freq: Once | INTRAMUSCULAR | Status: AC
  Administered 2018-01-10: 4 mg via INTRAVENOUS
  Filled 2018-01-10: qty 2

## 2018-01-10 MED ORDER — MAGNESIUM SULFATE 2 GM/50ML IV SOLN
2.0000 g | Freq: Once | INTRAVENOUS | Status: AC
  Administered 2018-01-10: 2 g via INTRAVENOUS
  Filled 2018-01-10: qty 50

## 2018-01-10 MED ORDER — POTASSIUM CHLORIDE 10 MEQ/100ML IV SOLN
10.0000 meq | Freq: Once | INTRAVENOUS | Status: AC
  Administered 2018-01-10: 10 meq via INTRAVENOUS
  Filled 2018-01-10: qty 100

## 2018-01-10 NOTE — ED Triage Notes (Signed)
Pt to ED via [facility] with c/o nausea, vomiting and diarrhea x's 3 days.;   Pt was seen and treated for same yesterday and dx with Colitis.

## 2018-01-10 NOTE — ED Triage Notes (Signed)
Pt st's unable to keep anything down.  Pt dry heaving in triage.

## 2018-01-11 ENCOUNTER — Encounter (HOSPITAL_COMMUNITY): Payer: Self-pay | Admitting: Family Medicine

## 2018-01-11 DIAGNOSIS — I10 Essential (primary) hypertension: Secondary | ICD-10-CM | POA: Diagnosis not present

## 2018-01-11 DIAGNOSIS — A09 Infectious gastroenteritis and colitis, unspecified: Secondary | ICD-10-CM | POA: Diagnosis present

## 2018-01-11 DIAGNOSIS — E8889 Other specified metabolic disorders: Secondary | ICD-10-CM | POA: Diagnosis present

## 2018-01-11 DIAGNOSIS — E1165 Type 2 diabetes mellitus with hyperglycemia: Secondary | ICD-10-CM | POA: Diagnosis not present

## 2018-01-11 DIAGNOSIS — E876 Hypokalemia: Secondary | ICD-10-CM | POA: Diagnosis present

## 2018-01-11 DIAGNOSIS — E119 Type 2 diabetes mellitus without complications: Secondary | ICD-10-CM | POA: Diagnosis present

## 2018-01-11 DIAGNOSIS — E785 Hyperlipidemia, unspecified: Secondary | ICD-10-CM | POA: Diagnosis present

## 2018-01-11 DIAGNOSIS — I236 Thrombosis of atrium, auricular appendage, and ventricle as current complications following acute myocardial infarction: Secondary | ICD-10-CM | POA: Diagnosis not present

## 2018-01-11 DIAGNOSIS — N179 Acute kidney failure, unspecified: Secondary | ICD-10-CM | POA: Diagnosis present

## 2018-01-11 DIAGNOSIS — Z8673 Personal history of transient ischemic attack (TIA), and cerebral infarction without residual deficits: Secondary | ICD-10-CM | POA: Diagnosis not present

## 2018-01-11 DIAGNOSIS — E561 Deficiency of vitamin K: Secondary | ICD-10-CM | POA: Diagnosis present

## 2018-01-11 DIAGNOSIS — I513 Intracardiac thrombosis, not elsewhere classified: Secondary | ICD-10-CM | POA: Diagnosis present

## 2018-01-11 DIAGNOSIS — G8929 Other chronic pain: Secondary | ICD-10-CM | POA: Diagnosis not present

## 2018-01-11 DIAGNOSIS — R000 Tachycardia, unspecified: Secondary | ICD-10-CM | POA: Diagnosis present

## 2018-01-11 DIAGNOSIS — R197 Diarrhea, unspecified: Secondary | ICD-10-CM | POA: Diagnosis not present

## 2018-01-11 DIAGNOSIS — E860 Dehydration: Secondary | ICD-10-CM | POA: Diagnosis present

## 2018-01-11 DIAGNOSIS — F4321 Adjustment disorder with depressed mood: Secondary | ICD-10-CM | POA: Diagnosis present

## 2018-01-11 DIAGNOSIS — R791 Abnormal coagulation profile: Secondary | ICD-10-CM | POA: Diagnosis present

## 2018-01-11 DIAGNOSIS — Z7901 Long term (current) use of anticoagulants: Secondary | ICD-10-CM | POA: Diagnosis not present

## 2018-01-11 DIAGNOSIS — Z681 Body mass index (BMI) 19 or less, adult: Secondary | ICD-10-CM | POA: Diagnosis not present

## 2018-01-11 DIAGNOSIS — E861 Hypovolemia: Secondary | ICD-10-CM | POA: Diagnosis present

## 2018-01-11 DIAGNOSIS — R1310 Dysphagia, unspecified: Secondary | ICD-10-CM | POA: Diagnosis present

## 2018-01-11 DIAGNOSIS — K920 Hematemesis: Secondary | ICD-10-CM | POA: Diagnosis present

## 2018-01-11 DIAGNOSIS — D573 Sickle-cell trait: Secondary | ICD-10-CM | POA: Diagnosis present

## 2018-01-11 DIAGNOSIS — I110 Hypertensive heart disease with heart failure: Secondary | ICD-10-CM | POA: Diagnosis present

## 2018-01-11 DIAGNOSIS — M170 Bilateral primary osteoarthritis of knee: Secondary | ICD-10-CM | POA: Diagnosis present

## 2018-01-11 DIAGNOSIS — R630 Anorexia: Secondary | ICD-10-CM | POA: Diagnosis present

## 2018-01-11 DIAGNOSIS — I5042 Chronic combined systolic (congestive) and diastolic (congestive) heart failure: Secondary | ICD-10-CM

## 2018-01-11 LAB — CBC WITH DIFFERENTIAL/PLATELET
BASOS ABS: 0 10*3/uL (ref 0.0–0.1)
Basophils Absolute: 0.1 10*3/uL (ref 0.0–0.1)
Basophils Relative: 0 %
Basophils Relative: 1 %
EOS ABS: 0 10*3/uL (ref 0.0–0.7)
EOS PCT: 0 %
Eosinophils Absolute: 0 10*3/uL (ref 0.0–0.7)
Eosinophils Relative: 0 %
HCT: 33.3 % — ABNORMAL LOW (ref 36.0–46.0)
HEMATOCRIT: 36.4 % (ref 36.0–46.0)
HEMOGLOBIN: 11.1 g/dL — AB (ref 12.0–15.0)
Hemoglobin: 12.3 g/dL (ref 12.0–15.0)
LYMPHS ABS: 0.9 10*3/uL (ref 0.7–4.0)
LYMPHS ABS: 1.6 10*3/uL (ref 0.7–4.0)
LYMPHS PCT: 12 %
Lymphocytes Relative: 21 %
MCH: 22.1 pg — ABNORMAL LOW (ref 26.0–34.0)
MCH: 22.3 pg — ABNORMAL LOW (ref 26.0–34.0)
MCHC: 33.3 g/dL (ref 30.0–36.0)
MCHC: 33.8 g/dL (ref 30.0–36.0)
MCV: 66.3 fL — ABNORMAL LOW (ref 78.0–100.0)
MCV: 66.1 fL — AB (ref 78.0–100.0)
MONOS PCT: 15 %
Monocytes Absolute: 0.8 10*3/uL (ref 0.1–1.0)
Monocytes Absolute: 1.1 10*3/uL — ABNORMAL HIGH (ref 0.1–1.0)
Monocytes Relative: 11 %
NEUTROS ABS: 5.6 10*3/uL (ref 1.7–7.7)
Neutro Abs: 4.8 10*3/uL (ref 1.7–7.7)
Neutrophils Relative %: 77 %
Neutrophils Relative %: 63 %
PLATELETS: 178 10*3/uL (ref 150–400)
Platelets: 211 10*3/uL (ref 150–400)
RBC: 5.02 MIL/uL (ref 3.87–5.11)
RBC: 5.51 MIL/uL — AB (ref 3.87–5.11)
RDW: 14.4 % (ref 11.5–15.5)
RDW: 14.3 % (ref 11.5–15.5)
WBC: 7.3 10*3/uL (ref 4.0–10.5)
WBC: 7.6 10*3/uL (ref 4.0–10.5)

## 2018-01-11 LAB — BASIC METABOLIC PANEL
Anion gap: 13 (ref 5–15)
Anion gap: 10 (ref 5–15)
BUN: 6 mg/dL (ref 6–20)
BUN: 5 mg/dL — AB (ref 6–20)
CALCIUM: 8.1 mg/dL — AB (ref 8.9–10.3)
CHLORIDE: 101 mmol/L (ref 101–111)
CO2: 24 mmol/L (ref 22–32)
CO2: 26 mmol/L (ref 22–32)
Calcium: 8.5 mg/dL — ABNORMAL LOW (ref 8.9–10.3)
Chloride: 101 mmol/L (ref 101–111)
Creatinine, Ser: 0.52 mg/dL (ref 0.44–1.00)
Creatinine, Ser: 0.58 mg/dL (ref 0.44–1.00)
GFR calc Af Amer: >60 mL/min (ref >60)
GFR calc Af Amer: >60 mL/min (ref >60)
GFR calc non Af Amer: >60 mL/min (ref >60)
GLUCOSE: 169 mg/dL — AB (ref 65–99)
Glucose, Bld: 134 mg/dL — ABNORMAL HIGH (ref 65–99)
Potassium: 2.6 mmol/L — CL (ref 3.5–5.1)
Potassium: 2.8 mmol/L — ABNORMAL LOW (ref 3.5–5.1)
Sodium: 138 mmol/L (ref 135–145)
Sodium: 137 mmol/L (ref 135–145)

## 2018-01-11 LAB — URINALYSIS, ROUTINE W REFLEX MICROSCOPIC
BILIRUBIN URINE: NEGATIVE
Glucose, UA: 150 mg/dL — AB
KETONES UR: 20 mg/dL — AB
NITRITE: NEGATIVE
PROTEIN: NEGATIVE mg/dL
Specific Gravity, Urine: 1.013 (ref 1.005–1.030)
pH: 6 (ref 5.0–8.0)

## 2018-01-11 LAB — HEMOGLOBIN A1C
Hgb A1c MFr Bld: 7.7 % — ABNORMAL HIGH (ref 4.8–5.6)
Mean Plasma Glucose: 174.29 mg/dL

## 2018-01-11 LAB — GLUCOSE, CAPILLARY
GLUCOSE-CAPILLARY: 165 mg/dL — AB (ref 65–99)
Glucose-Capillary: 116 mg/dL — ABNORMAL HIGH (ref 65–99)
Glucose-Capillary: 131 mg/dL — ABNORMAL HIGH (ref 65–99)
Glucose-Capillary: 134 mg/dL — ABNORMAL HIGH (ref 65–99)
Glucose-Capillary: 154 mg/dL — ABNORMAL HIGH (ref 65–99)
Glucose-Capillary: 125 mg/dL — ABNORMAL HIGH (ref 65–99)

## 2018-01-11 LAB — MAGNESIUM: Magnesium: 1.7 mg/dL (ref 1.7–2.4)

## 2018-01-11 LAB — PROTIME-INR
INR: 3.64
PROTHROMBIN TIME: 35.9 s — AB (ref 11.4–15.2)

## 2018-01-11 LAB — HIV ANTIBODY (ROUTINE TESTING W REFLEX): HIV SCREEN 4TH GENERATION: NONREACTIVE

## 2018-01-11 MED ORDER — FAMOTIDINE 20 MG IN NS 100 ML IVPB
20.0000 mg | Freq: Two times a day (BID) | INTRAVENOUS | Status: DC
  Administered 2018-01-11 – 2018-01-15 (×10): 20 mg via INTRAVENOUS
  Filled 2018-01-11 (×22): qty 100
  Filled 2018-01-11: qty 2
  Filled 2018-01-11 (×9): qty 100

## 2018-01-11 MED ORDER — HYDRALAZINE HCL 20 MG/ML IJ SOLN
10.0000 mg | INTRAMUSCULAR | Status: DC | PRN
  Administered 2018-01-12: 10 mg via INTRAVENOUS
  Filled 2018-01-11: qty 1

## 2018-01-11 MED ORDER — WARFARIN - PHARMACIST DOSING INPATIENT: Freq: Every day | Status: DC

## 2018-01-11 MED ORDER — ONDANSETRON HCL 4 MG/2ML IJ SOLN
4.0000 mg | Freq: Three times a day (TID) | INTRAMUSCULAR | Status: DC | PRN
  Administered 2018-01-11 – 2018-01-13 (×3): 4 mg via INTRAVENOUS
  Filled 2018-01-11 (×3): qty 2

## 2018-01-11 MED ORDER — PROMETHAZINE HCL 25 MG/ML IJ SOLN
6.2500 mg | Freq: Four times a day (QID) | INTRAMUSCULAR | Status: DC | PRN
  Administered 2018-01-11: 6.25 mg via INTRAVENOUS
  Filled 2018-01-11: qty 1

## 2018-01-11 MED ORDER — POTASSIUM CHLORIDE 2 MEQ/ML IV SOLN
INTRAVENOUS | Status: AC
  Administered 2018-01-11 – 2018-01-13 (×4): via INTRAVENOUS
  Filled 2018-01-11 (×6): qty 1000

## 2018-01-11 MED ORDER — PIPERACILLIN-TAZOBACTAM 3.375 G IVPB
3.3750 g | Freq: Three times a day (TID) | INTRAVENOUS | Status: DC
  Administered 2018-01-11 – 2018-01-14 (×10): 3.375 g via INTRAVENOUS
  Filled 2018-01-11 (×11): qty 50

## 2018-01-11 MED ORDER — POTASSIUM CHLORIDE IN NACL 20-0.9 MEQ/L-% IV SOLN: INTRAVENOUS | Status: DC

## 2018-01-11 MED ORDER — INSULIN ASPART 100 UNIT/ML ~~LOC~~ SOLN
0.0000 [IU] | SUBCUTANEOUS | Status: DC
  Administered 2018-01-11: 2 [IU] via SUBCUTANEOUS
  Administered 2018-01-11: 1 [IU] via SUBCUTANEOUS
  Administered 2018-01-11: 2 [IU] via SUBCUTANEOUS
  Administered 2018-01-11 – 2018-01-12 (×3): 1 [IU] via SUBCUTANEOUS
  Administered 2018-01-12 (×4): 2 [IU] via SUBCUTANEOUS
  Administered 2018-01-12: 3 [IU] via SUBCUTANEOUS
  Administered 2018-01-13 (×4): 2 [IU] via SUBCUTANEOUS
  Administered 2018-01-13: 1 [IU] via SUBCUTANEOUS
  Administered 2018-01-14: 2 [IU] via SUBCUTANEOUS
  Administered 2018-01-14 (×3): 1 [IU] via SUBCUTANEOUS
  Administered 2018-01-14: 2 [IU] via SUBCUTANEOUS
  Administered 2018-01-14 – 2018-01-15 (×2): 1 [IU] via SUBCUTANEOUS

## 2018-01-11 MED ORDER — ONDANSETRON HCL 4 MG PO TABS: 4.0000 mg | ORAL_TABLET | Freq: Three times a day (TID) | ORAL | Status: DC | PRN

## 2018-01-11 MED ORDER — ONDANSETRON HCL 4 MG/2ML IJ SOLN: 4.0000 mg | Freq: Four times a day (QID) | INTRAMUSCULAR | Status: DC | PRN

## 2018-01-11 MED ORDER — POTASSIUM CHLORIDE 10 MEQ/100ML IV SOLN
10.0000 meq | INTRAVENOUS | Status: AC
  Administered 2018-01-11 (×2): 10 meq via INTRAVENOUS
  Filled 2018-01-11 (×2): qty 100

## 2018-01-11 MED ORDER — FENTANYL CITRATE (PF) 100 MCG/2ML IJ SOLN
25.0000 ug | INTRAMUSCULAR | Status: DC | PRN
  Administered 2018-01-11: 25 ug via INTRAVENOUS
  Administered 2018-01-11 – 2018-01-12 (×2): 50 ug via INTRAVENOUS
  Filled 2018-01-11 (×3): qty 2

## 2018-01-11 MED ORDER — ONDANSETRON HCL 4 MG PO TABS: 8.0000 mg | ORAL_TABLET | Freq: Three times a day (TID) | ORAL | Status: DC | PRN

## 2018-01-11 MED ORDER — ATORVASTATIN CALCIUM 20 MG PO TABS
20.0000 mg | ORAL_TABLET | Freq: Every day | ORAL | Status: DC
  Administered 2018-01-12 – 2018-01-14 (×3): 20 mg via ORAL
  Filled 2018-01-11 (×3): qty 1

## 2018-01-11 MED ORDER — SODIUM CHLORIDE 0.9 % IV SOLN
INTRAVENOUS | Status: DC
  Administered 2018-01-11: 03:00:00 via INTRAVENOUS

## 2018-01-11 MED ORDER — FAMOTIDINE 20 MG IN NS 100 ML IVPB: 20.0000 mg | Freq: Two times a day (BID) | INTRAVENOUS | Status: DC

## 2018-01-11 MED ORDER — ASPIRIN EC 81 MG PO TBEC
81.0000 mg | DELAYED_RELEASE_TABLET | Freq: Every day | ORAL | Status: DC
  Administered 2018-01-11 – 2018-01-15 (×5): 81 mg via ORAL
  Filled 2018-01-11 (×5): qty 1

## 2018-01-11 MED ORDER — LISINOPRIL 10 MG PO TABS
10.0000 mg | ORAL_TABLET | Freq: Every day | ORAL | Status: DC
  Administered 2018-01-11 – 2018-01-15 (×5): 10 mg via ORAL
  Filled 2018-01-11 (×5): qty 1

## 2018-01-11 MED ORDER — CARVEDILOL 25 MG PO TABS
25.0000 mg | ORAL_TABLET | Freq: Two times a day (BID) | ORAL | Status: DC
  Administered 2018-01-11 – 2018-01-15 (×8): 25 mg via ORAL
  Filled 2018-01-11 (×8): qty 1

## 2018-01-11 MED ORDER — POTASSIUM CHLORIDE CRYS ER 20 MEQ PO TBCR
40.0000 meq | EXTENDED_RELEASE_TABLET | Freq: Four times a day (QID) | ORAL | Status: DC
  Administered 2018-01-11: 40 meq via ORAL
  Filled 2018-01-11: qty 2

## 2018-01-11 MED ORDER — CIPROFLOXACIN IN D5W 400 MG/200ML IV SOLN: 400.0000 mg | Freq: Two times a day (BID) | INTRAVENOUS | Status: DC

## 2018-01-11 MED ORDER — MAGNESIUM SULFATE 2 GM/50ML IV SOLN
2.0000 g | Freq: Once | INTRAVENOUS | Status: AC
  Administered 2018-01-11: 2 g via INTRAVENOUS
  Filled 2018-01-11: qty 50

## 2018-01-11 MED ORDER — ONDANSETRON HCL 4 MG PO TABS: 4.0000 mg | ORAL_TABLET | Freq: Four times a day (QID) | ORAL | Status: DC | PRN

## 2018-01-11 MED ORDER — METRONIDAZOLE IN NACL 5-0.79 MG/ML-% IV SOLN: 500.0000 mg | Freq: Three times a day (TID) | INTRAVENOUS | Status: DC

## 2018-01-11 MED ORDER — SODIUM CHLORIDE 0.9 % IV SOLN: 8.0000 mg | Freq: Three times a day (TID) | INTRAVENOUS | Status: DC | PRN

## 2018-01-11 NOTE — Progress Notes (Signed)
ANTICOAGULATION CONSULT NOTE - Initial Consult  Pharmacy Consult for Warfarin  Indication: LV thrombus/CVA  No Known Allergies  Vital Signs: Temp: 98.5 F (36.9 C) (02/16 2011) Temp Source: Oral (02/16 2011) BP: 130/73 (02/17 0145) Pulse Rate: 106 (02/17 0145)  Labs: Recent Labs    01/09/18 1707 01/10/18 2045  HGB 12.1 13.4  HCT 37.9 40.1  PLT 190 204  LABPROT  --  31.8*  INR  --  3.12  CREATININE 1.09* 0.66    Estimated Creatinine Clearance: 56.4 mL/min (by C-G formula based on SCr of 0.66 mg/dL).   Medical History: Past Medical History:  Diagnosis Date  . Allergic rhinitis    Requires cetirizine, singulair, and fluticasone.  . Anxiety    Has been on Xanax since 2009. Uses it for stress, anxiety, and insomnia. No contract yet.  . Arthritis   . CHF (congestive heart failure) (HCC)    EF 35% after stroke, presumed ischemic  . Chronic pain    Has OA of knees B. No Xrays in echart. Not requiring narcotics.  . Depression   . Diabetes mellitus    Type 2, non insulin dependent. Was dx'd prior to 2008.  Marland Kitchen Hyperglycemia   . Hyperlipemia   . Hypertension   . Sickle cell trait ([place])   . 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 INR is just above goal at 3.12. Presents to the ED with abdominal pain. Recommended changing Cipro/Flagyl to Zosyn due to drug interactions. CBC good.   Goal of Therapy:  INR 2-3 Monitor platelets by anticoagulation protocol: Yes   Plan:  INR with AM labs to assess dosing needs  Narda Bonds 01/11/2018,1:57 AM

## 2018-01-11 NOTE — Progress Notes (Signed)
MD paged about critical value of potassium of 2.6. No call back and no new orders as at now.

## 2018-01-11 NOTE — H&P (Addendum)
History and Physical    MARVIN MAENZA FVC:[id number] DOB: [birth year] DOA: 01/10/2018  PCP: Audley Hose, MD   Patient coming from: Home  Chief Complaint: Abdominal pain with N/V/D   HPI: Kristen Lozano is a 62 y.o. female with medical history significant for hypertension, type 2 diabetes mellitus, history of CVA, and chronic combined systolic and diastolic CHF, now presenting to the emergency department for evaluation of generalized abdominal discomfort with nausea, vomiting, and diarrhea.  Patient reports that symptoms developed several days ago, she was seen in the ED yesterday CT of the abdomen and pelvis was concerning for infectious colitis, and she was discharged home with ciprofloxacin, Flagyl, and Phenergan.  She continues to have persistent nausea with nonbloody vomiting despite the Phenergan and has been unable to tolerate her other medications.  Denies fevers or chills, denies sick contacts, denies travel, and denies chest pain or palpitations.  No melena or hematochezia.  ED Course: Upon arrival to the ED, patient is found to be afebrile, saturating well on room air, slightly tachycardic, and with stable blood pressure.  EKG features sinus tachycardia with rate 107.  CT abdomen/pelvis from yesterday revealed generalized mild to moderate wall thickening throughout the colon concerning for acute infectious colitis.  Chemistry panel is notable for potassium of 3.2, CBC features a stable chronic microcytosis, and INR is within the therapeutic range.  Patient was given 2 L of normal saline, 2 g IV magnesium, 10 mEq IV potassium, and Zofran in the ED.  She continues to vomit and will be observed on the medical-surgical unit for ongoing evaluation and infectious colitis with ongoing nausea, vomiting, and diarrhea with inability to take her medications secondary to this and concern for dehydration.  Review of Systems:  All other systems reviewed and apart from HPI, are negative.  Past  Medical History:  Diagnosis Date  . Allergic rhinitis    Requires cetirizine, singulair, and fluticasone.  . Anxiety    Has been on Xanax since 2009. Uses it for stress, anxiety, and insomnia. No contract yet.  . Arthritis   . CHF (congestive heart failure) (HCC)    EF 35% after stroke, presumed ischemic  . Chronic pain    Has OA of knees B. No Xrays in echart. Not requiring narcotics.  . Depression   . Diabetes mellitus    Type 2, non insulin dependent. Was dx'd prior to 2008.  Marland Kitchen Hyperglycemia   . Hyperlipemia   . Hypertension   . Sickle cell trait ([place])   . 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 TEE WITHOUT CARDIOVERSION N/A 09/06/2014   Procedure: TRANSESOPHAGEAL ECHOCARDIOGRAM (TEE);  Surgeon: Pixie Casino, MD;  Location: [facility] ENDOSCOPY;  Service: Cardiovascular;  Laterality: N/A;     reports that  has never smoked. she has never used smokeless tobacco. She reports that she does not drink alcohol or use drugs.  No Known Allergies  Family History  Problem Relation Age of Onset  . Diabetes Mother   . Hypertension Mother   . Heart disease Father   . Heart attack Father   . Hypertension Father   . Diabetes Father   . Ovarian cancer Sister   . Liver cancer Sister   . Sickle cell anemia Daughter   . Schizophrenia Daughter   . Hypertension Sister   . Hypertension Brother   . Hypertension Daughter   .  Kidney disease Sister        x2  . Kidney disease Brother   . Stroke Neg Hx   . Esophageal cancer Neg Hx   . Colon cancer Neg Hx   . Colon polyps Neg Hx      Prior to Admission medications   Medication Sig Start Date End Date Taking? Authorizing Provider  aspirin 81 MG tablet Take 1 tablet (81 mg total) by mouth daily. 11/11/16   Imogene Burn, PA-C  atorvastatin (LIPITOR) 20 MG tablet Take 1 tablet (20 mg total) by mouth daily at 6 PM. Please schedule  appointment for refills. 01/02/18   Hilty, Nadean Corwin, MD  carvedilol (COREG) 25 MG tablet Take 1 tablet (25 mg total) by mouth 2 (two) times daily with a meal. Please schedule appointment for refills. 01/02/18   Hilty, Nadean Corwin, MD  ciprofloxacin (CIPRO) 500 MG tablet Take 1 tablet (500 mg total) by mouth 2 (two) times daily. One po bid x 7 days 01/09/18   Jacqlyn Larsen, PA-C  lisinopril (PRINIVIL,ZESTRIL) 10 MG tablet Take 1 tablet (10 mg total) by mouth daily. 08/18/17   Hilty, Nadean Corwin, MD  metFORMIN (GLUCOPHAGE) 1000 MG tablet Take 1 tablet (1,000 mg total) by mouth 2 (two) times daily with a meal. 11/01/16   Dessa Phi, DO  metroNIDAZOLE (FLAGYL) 500 MG tablet Take 1 tablet (500 mg total) by mouth 2 (two) times daily. One po bid x 7 days 01/09/18   Jacqlyn Larsen, PA-C  promethazine (PHENERGAN) 12.5 MG tablet Take 1 tablet (12.5 mg total) by mouth every 6 (six) hours as needed for nausea or vomiting. 01/09/18   Jacqlyn Larsen, PA-C  sitaGLIPtin (JANUVIA) 100 MG tablet Take 1 tablet (100 mg total) by mouth daily. 11/01/16   Dessa Phi, DO  TRADJENTA 5 MG TABS tablet Take 5 mg by mouth daily. 11/01/16   [provider]  TRULICITY 2.72 ZD/6.6YQ SOPN Inject 0.75 mg as directed once a week. Tuesday 12/31/17   [provider]  warfarin (COUMADIN) 5 MG tablet Tale 1 tablet (90m) daily except 1.5  Tablets (7.531m every Monday OR as directed by coumadin clinic Patient taking differently: Take 5 mg by mouth one time only at 6 PM. Tale 1 tablet (66m32mdaily   1.5  Tablets (7.66mg44mvery Monday and Friday 10/27/17   HiltPixie Casino    Physical Exam: Vitals:   01/10/18 2011  BP: (!) 152/87  Pulse: (!) 101  Resp: 16  Temp: 98.5 F (36.9 C)  TempSrc: Oral  SpO2: 100%      Constitutional: NAD, calm, frail Eyes: PERTLA, lids and conjunctivae normal ENMT: Mucous membranes are moist. Posterior pharynx clear of any exudate or lesions.   Neck: normal, supple, no masses, no  thyromegaly Respiratory: clear to auscultation bilaterally, no wheezing, no crackles. Normal respiratory effort.   Cardiovascular: Rate ~110 and regular. No extremity edema. No significant JVD. Abdomen: No distension, soft, mild generalized tenderness. Bowel sounds active.  Musculoskeletal: no clubbing / cyanosis. No joint deformity upper and lower extremities.  Skin: no significant rashes, lesions, ulcers. Poor turgor. Neurologic: CN 2-12 grossly intact. Sensation intact. Strength 5/5 in all 4 limbs.  Psychiatric: Alert and oriented x 3. Pleasant, cooperative.     Labs on Admission: I have personally reviewed following labs and imaging studies  CBC: Recent Labs  Lab 01/09/18 1707 01/10/18 2045  WBC 7.7 8.5  NEUTROABS 6.8  --   HGB 12.1 13.4  HCT 37.9 40.1  MCV 67.1* 66.6*  PLT 190 332   Basic Metabolic Panel: Recent Labs  Lab 01/09/18 1707 01/10/18 2045 01/10/18 2307  NA 140 139  --   K 3.6 3.2*  --   CL 101 98*  --   CO2 23 25  --   GLUCOSE 264* 191*  --   BUN 31* 11  --   CREATININE 1.09* 0.66  --   CALCIUM 9.6 9.6  --   MG  --   --  1.7   GFR: Estimated Creatinine Clearance: 56.4 mL/min (by C-G formula based on SCr of 0.66 mg/dL). Liver Function Tests: Recent Labs  Lab 01/09/18 1707 01/10/18 2045  AST 49* 31  ALT 36 27  ALKPHOS 51 57  BILITOT 0.6 0.9  PROT 8.0 8.8*  ALBUMIN 3.8 4.0   Recent Labs  Lab 01/09/18 1707 01/10/18 2045  LIPASE 26 30   No results for input(s): AMMONIA in the last 168 hours. Coagulation Profile: Recent Labs  Lab 01/10/18 2045  INR 3.12   Cardiac Enzymes: No results for input(s): CKTOTAL, CKMB, CKMBINDEX, TROPONINI in the last 168 hours. BNP (last 3 results) No results for input(s): PROBNP in the last 8760 hours. HbA1C: No results for input(s): HGBA1C in the last 72 hours. CBG: Recent Labs  Lab 01/09/18 1809  GLUCAP 246*   Lipid Profile: No results for input(s): CHOL, HDL, LDLCALC, TRIG, CHOLHDL, LDLDIRECT in  the last 72 hours. Thyroid Function Tests: No results for input(s): TSH, T4TOTAL, FREET4, T3FREE, THYROIDAB in the last 72 hours. Anemia Panel: No results for input(s): VITAMINB12, FOLATE, FERRITIN, TIBC, IRON, RETICCTPCT in the last 72 hours. Urine analysis:    Component Value Date/Time   COLORURINE YELLOW 01/10/2018 2350   APPEARANCEUR HAZY (A) 01/10/2018 2350   LABSPEC 1.013 01/10/2018 2350   PHURINE 6.0 01/10/2018 2350   GLUCOSEU 150 (A) 01/10/2018 2350   HGBUR SMALL (A) 01/10/2018 2350   BILIRUBINUR NEGATIVE 01/10/2018 2350   KETONESUR 20 (A) 01/10/2018 2350   PROTEINUR NEGATIVE 01/10/2018 2350   UROBILINOGEN 1.0 09/07/2014 1405   NITRITE NEGATIVE 01/10/2018 2350   LEUKOCYTESUR MODERATE (A) 01/10/2018 2350   Sepsis Labs: @LABRCNTIP (procalcitonin:4,lacticidven:4) )No results found for this or any previous visit (from the past 240 hour(s)).   Radiological Exams on Admission: Ct Abdomen Pelvis W Contrast  Result Date: 01/09/2018 CLINICAL DATA:  62 year old female with generalized abdominal pain, nausea vomiting diarrhea beginning yesterday. EXAM: CT ABDOMEN AND PELVIS WITH CONTRAST TECHNIQUE: Multidetector CT imaging of the abdomen and pelvis was performed using the standard protocol following bolus administration of intravenous contrast. CONTRAST:  41m ISOVUE-300 IOPAMIDOL (ISOVUE-300) INJECTION 61% COMPARISON:  Abdomen MRI and MRCP 03/08/2015. CT chest abdomen and Pelvis 09/07/2014. FINDINGS: Lower chest: Mild respiratory motion at the lung bases which appear clear. Stable cardiac size since 2015, borderline to mild cardiac enlargement. No pericardial or pleural effusion. Hepatobiliary: Negative gallbladder. Stable biliary tree. Normal liver enhancement. Pancreas: Negative. Spleen: Negative. Adrenals/Urinary Tract: Normal adrenal glands. Scattered areas of left greater than right chronic renal cortical scarring corresponds to areas of pyelonephritis on the 2015 comparison. Bilateral  renal enhancement and contrast excretion today is within normal limits. No perinephric stranding or hydronephrosis. Decompressed and unremarkable urinary bladder. Stomach/Bowel: The rectosigmoid colon appears decompressed but at the same time partially fluid-filled with suggestion of generalized mild wall thickening (series 3, image 67). No mesenteric stranding is associated. There is a similar appearance of the descending colon. The transverse colon is completely decompressed,  not containing fluid. The ascending colon and cecum demonstrate no definite wall thickening, although there is an unusual appearance of lobulated fluid within the ascending colon (coronal image 50). As elsewhere, no colonic mesenteric stranding is identified. The appendix is normal, best seen on coronal images. Negative terminal ileum. Numerous decompressed small bowel loops in the pelvis are within normal limits. Oral contrast has only reached the proximal jejunum. No dilated small bowel. Negative stomach and duodenum. No abdominal free air or free fluid. Vascular/Lymphatic: Minimal atherosclerosis. Major arterial structures in the abdomen and pelvis are patent. Portal venous system is patent. No lymphadenopathy. Reproductive: Negative. Other: No pelvic free fluid. Musculoskeletal: Negative. IMPRESSION: 1. Generalized mild to moderate wall thickening throughout the colon suspicious for acute infectious colitis in this clinical setting. Superimposed unusual appearance of fluid or fluid collection within the ascending colon. Recommend follow-up evaluation, preferably with colonoscopy or virtual colonoscopy after the acute illness has resolved. 2. No associated bowel obstruction or abdominal free fluid. No other acute or inflammatory findings in the abdomen or pelvis. 3. Chronic renal scarring related to 2015 pyelonephritis. Electronically Signed   By: Genevie Ann M.D.   On: 01/09/2018 20:25    EKG: Independently reviewed. Sinus tachycardia  (rate 107).   Assessment/Plan  1. Colitis, presumed infectious  - Presents with N/V/D for several days, continuing to vomit despite phenergan at home and has been unable to tolerate her medications due to this  - Afebrile and no leukocytosis on admission  - CT abd/pelvis suggests infectious colitis  - Continue empiric abx (pharmacy recommends Zosyn d/t Flagyl's effects on INR), gentle IVF hydration, monitor and replete lytes, prn antiemetics and analgesia, enteric precautions, send stool for GI pathogen panel    2. Hypokalemia  - Serum potassium is 3.2 in setting of N/V/D  - Treated with 30 mEq IV potassium  - Repeat chem panel in am   3. Chronic combined systolic & diastolic CHF  - Hypovolemic on admission in setting of diarrhea and loss of appetite  - Treated with 2 liters NS in ED  - Follow daily wt and I/O's, continue Coreg and lisinopril    4. LV thrombus  - Continue warfarin   5. Hypertension  - BP at goal  - Continue Coreg and lisinopril    6. Type II DM  - A1c was >15.5% one year ago  - Managed at home with metformin, Tradjenta, Trulicity, and Januvia; held on admission  - Follow CBG's and start SSI with Novolog    7. Hx of CVA  - Stable  - Continue aspirin and statin     DVT prophylaxis: warfarin  Code Status: Full  Family Communication: Discussed with patient Disposition Plan: Observe on med-surg Consults called: None Admission status: Observation    Vianne Bulls, MD [facility] Pager [phone]  If 7PM-7AM, please contact night-coverage [email or web address] Password [email or web address]  01/11/2018, 1:25 AM

## 2018-01-11 NOTE — ED Notes (Signed)
Admitting MD paged for pt. Reporting stomach pain. MD will put in orders.

## 2018-01-11 NOTE — ED Provider Notes (Signed)
[facility] [facility]  SURGICAL Provider Note   CSN: [id number] Arrival date & time: 01/10/18  2004     History   Chief Complaint Chief Complaint  Patient presents with  . Emesis  . Diarrhea    HPI Kristen Lozano is a 62 y.o. female.  HPI   62 year old female with a history of diabetes, hypertension, hyperlipidemia, CHF, recent presentation with nausea, vomiting via, with evaluation in the ED yesterday including CT scan and fluids with diagnosis of likely infectious colitis, presents with concern for continued nausea, vomiting and generalized weakness despite home antiemetics.  Patient reports that since she left the emergency department, she has had at least 10-11 episodes of emesis, and has had more diarrhea than she can count.  Denies any black or bloody stools.  Patient reports she does not know if she is been feeling lightheaded.  Reports that she has abdominal pain which is diffuse.  Denies any known fevers.  Denies urinary symptoms.  Reports she has not been on recent antibiotics prior to development of nausea, vomiting and diarrhea, although she was prescribed Cipro and Flagyl yesterday for her colitis. She has been taking antinausea medicine prescribed without relief.   Past Medical History:  Diagnosis Date  . Allergic rhinitis    Requires cetirizine, singulair, and fluticasone.  . Anxiety    Has been on Xanax since 2009. Uses it for stress, anxiety, and insomnia. No contract yet.  . Arthritis   . CHF (congestive heart failure) (HCC)    EF 35% after stroke, presumed ischemic  . Chronic pain    Has OA of knees B. No Xrays in echart. Not requiring narcotics.  . Depression   . Diabetes mellitus    Type 2, non insulin dependent. Was dx'd prior to 2008.  Marland Kitchen Hyperglycemia   . Hyperlipemia   . Hypertension   . Sickle cell trait ([place])   . 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 Infectious colitis 01/11/2018  . Hypokalemia 01/11/2018  . Uncontrolled type 2 diabetes mellitus with hyperglycemia ([place])   . Chronic combined systolic and diastolic CHF (congestive heart failure) ([place]) 12/16/2016  . Monitoring for long-term anticoagulant use 11/14/2016  . DKA (diabetic ketoacidoses) ([place]) 11/01/2016  . Diabetes mellitus 11/01/2016  . Elevated troponin   . Cardiomyopathy, ischemic   . LV (left ventricular) mural thrombus following MI ([place])   . Cognitive deficit due to recent stroke 10/18/2014  . History of completed stroke 10/18/2014  . Abnormal stress test 10/17/2014  . Dysphagia, pharyngoesophageal phase 09/23/2014  . Abnormal CT scan 09/23/2014  . Dilated bile duct 09/23/2014  . Urinary retention 09/07/2014  . 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 Stroke ([place]) 09/05/2014  . CVA (cerebral infarction) 09/05/2014  . Non compliance w medication regimen 08/10/2012  . Fatigue 02/18/2012  . Essential hypertension 03/22/2011  . Healthcare maintenance 03/22/2011  . Chronic pain   . ALLERGIC RHINITIS, SEASONAL 03/14/2008  . SICKLE CELL TRAIT 10/31/2006  . Anxiety state 10/31/2006  . DEPRESSION 10/31/2006    Past Surgical History:  Procedure Laterality Date  . TEE WITHOUT CARDIOVERSION N/A 09/06/2014   Procedure: TRANSESOPHAGEAL ECHOCARDIOGRAM (TEE);  Surgeon: Pixie Casino, MD;  Location: [facility] ENDOSCOPY;  Service: Cardiovascular;  Laterality: N/A;    OB History    No data available       Home  Medications    Prior to Admission medications   Medication Sig Start Date End Date Taking? Authorizing Provider  aspirin 81 MG tablet Take 1 tablet (81 mg total) by mouth daily. 11/11/16   Imogene Burn, PA-C  atorvastatin (LIPITOR) 20 MG tablet Take 1 tablet (20 mg total) by mouth daily at 6 PM. Please schedule appointment for refills. 01/02/18   Hilty, Nadean Corwin, MD  carvedilol (COREG) 25 MG tablet Take 1 tablet (25 mg total) by  mouth 2 (two) times daily with a meal. Please schedule appointment for refills. 01/02/18   Hilty, Nadean Corwin, MD  ciprofloxacin (CIPRO) 500 MG tablet Take 1 tablet (500 mg total) by mouth 2 (two) times daily. One po bid x 7 days 01/09/18   Jacqlyn Larsen, PA-C  lisinopril (PRINIVIL,ZESTRIL) 10 MG tablet Take 1 tablet (10 mg total) by mouth daily. 08/18/17   Hilty, Nadean Corwin, MD  metFORMIN (GLUCOPHAGE) 1000 MG tablet Take 1 tablet (1,000 mg total) by mouth 2 (two) times daily with a meal. 11/01/16   Dessa Phi, DO  metroNIDAZOLE (FLAGYL) 500 MG tablet Take 1 tablet (500 mg total) by mouth 2 (two) times daily. One po bid x 7 days 01/09/18   Jacqlyn Larsen, PA-C  promethazine (PHENERGAN) 12.5 MG tablet Take 1 tablet (12.5 mg total) by mouth every 6 (six) hours as needed for nausea or vomiting. 01/09/18   Jacqlyn Larsen, PA-C  sitaGLIPtin (JANUVIA) 100 MG tablet Take 1 tablet (100 mg total) by mouth daily. 11/01/16   Dessa Phi, DO  TRADJENTA 5 MG TABS tablet Take 5 mg by mouth daily. 11/01/16   [provider]  TRULICITY 2.95 JO/8.4ZY SOPN Inject 0.75 mg as directed once a week. Tuesday 12/31/17   [provider]  warfarin (COUMADIN) 5 MG tablet Tale 1 tablet (5mg ) daily except 1.5  Tablets (7.5mg ) every Monday OR as directed by coumadin clinic Patient taking differently: Take 5 mg by mouth one time only at 6 PM. Tale 1 tablet (5mg ) daily   1.5  Tablets (7.5mg ) every Monday and Friday 10/27/17   Hilty, Nadean Corwin, MD    Family History Family History  Problem Relation Age of Onset  . Diabetes Mother   . Hypertension Mother   . Heart disease Father   . Heart attack Father   . Hypertension Father   . Diabetes Father   . Ovarian cancer Sister   . Liver cancer Sister   . Sickle cell anemia Daughter   . Schizophrenia Daughter   . Hypertension Sister   . Hypertension Brother   . Hypertension Daughter   . Kidney disease Sister        x2  . Kidney disease Brother   . Stroke Neg Hx     . Esophageal cancer Neg Hx   . Colon cancer Neg Hx   . Colon polyps Neg Hx     Social History Social History   Tobacco Use  . Smoking status: Never Smoker  . Smokeless tobacco: Never Used  Substance Use Topics  . Alcohol use: No    Alcohol/week: 0.0 oz  . Drug use: No     Allergies   Patient has no known allergies.   Review of Systems Review of Systems  Constitutional: Positive for fatigue. Negative for fever.  HENT: Negative for sore throat.   Eyes: Negative for visual disturbance.  Respiratory: Negative for cough and shortness of breath.   Cardiovascular: Negative for chest pain.  Gastrointestinal: Positive  for abdominal pain, diarrhea and nausea. Negative for vomiting.  Genitourinary: Negative for difficulty urinating.  Musculoskeletal: Negative for back pain and neck pain.  Skin: Negative for rash.  Neurological: Negative for syncope, light-headedness and headaches.     Physical Exam Updated Vital Signs BP (!) 164/93 (BP Location: Left Arm)   Pulse (!) 104   Temp 98.7 F (37.1 C) (Oral)   Resp 12   Wt 46.2 kg (101 lb 13.6 oz)   SpO2 99%   BMI 18.04 kg/m   Physical Exam General: Fatigued, sleepy, in no acute distress HEENT: Normal conjunctivae, normal EOM, atraumatic, mucous membranes dry Cardiac: tachycardia, regular rhythm, no murmurs Lungs: CTAB Abdomen: Diffuse mild tenderness, soft, nondistended Ext: No peripheral edema Skin: warm, dry  ED Treatments / Results  Labs (all labs ordered are listed, but only abnormal results are displayed) Labs Reviewed  COMPREHENSIVE METABOLIC PANEL - Abnormal; Notable for the following components:      Result Value   Potassium 3.2 (*)    Chloride 98 (*)    Glucose, Bld 191 (*)    Total Protein 8.8 (*)    Anion gap 16 (*)    All other components within normal limits  CBC - Abnormal; Notable for the following components:   RBC 6.02 (*)    MCV 66.6 (*)    MCH 22.3 (*)    All other components within normal  limits  URINALYSIS, ROUTINE W REFLEX MICROSCOPIC - Abnormal; Notable for the following components:   APPearance HAZY (*)    Glucose, UA 150 (*)    Hgb urine dipstick SMALL (*)    Ketones, ur 20 (*)    Leukocytes, UA MODERATE (*)    Bacteria, UA RARE (*)    Squamous Epithelial / LPF 6-30 (*)    All other components within normal limits  PROTIME-INR - Abnormal; Notable for the following components:   Prothrombin Time 31.8 (*)    All other components within normal limits  GASTROINTESTINAL PANEL BY PCR, STOOL (REPLACES STOOL CULTURE)  LIPASE, BLOOD  MAGNESIUM  HIV ANTIBODY (ROUTINE TESTING)  BASIC METABOLIC PANEL  CBC WITH DIFFERENTIAL/PLATELET  PROTIME-INR    EKG  EKG Interpretation  Date/Time:  Saturday January 10 2018 22:52:30 EST Ventricular Rate:  107 PR Interval:  140 QRS Duration: 90 QT Interval:  337 QTC Calculation: 450 R Axis:   68 Text Interpretation:  Sinus tachycardia No significant change since last tracing Confirmed by Gareth Morgan [phone]) on 01/11/2018 2:37:34 AM       Radiology Ct Abdomen Pelvis W Contrast  Result Date: 01/09/2018 CLINICAL DATA:  62 year old female with generalized abdominal pain, nausea vomiting diarrhea beginning yesterday. EXAM: CT ABDOMEN AND PELVIS WITH CONTRAST TECHNIQUE: Multidetector CT imaging of the abdomen and pelvis was performed using the standard protocol following bolus administration of intravenous contrast. CONTRAST:  83mL ISOVUE-300 IOPAMIDOL (ISOVUE-300) INJECTION 61% COMPARISON:  Abdomen MRI and MRCP 03/08/2015. CT chest abdomen and Pelvis 09/07/2014. FINDINGS: Lower chest: Mild respiratory motion at the lung bases which appear clear. Stable cardiac size since 2015, borderline to mild cardiac enlargement. No pericardial or pleural effusion. Hepatobiliary: Negative gallbladder. Stable biliary tree. Normal liver enhancement. Pancreas: Negative. Spleen: Negative. Adrenals/Urinary Tract: Normal adrenal glands. Scattered areas  of left greater than right chronic renal cortical scarring corresponds to areas of pyelonephritis on the 2015 comparison. Bilateral renal enhancement and contrast excretion today is within normal limits. No perinephric stranding or hydronephrosis. Decompressed and unremarkable urinary bladder. Stomach/Bowel: The rectosigmoid colon appears  decompressed but at the same time partially fluid-filled with suggestion of generalized mild wall thickening (series 3, image 67). No mesenteric stranding is associated. There is a similar appearance of the descending colon. The transverse colon is completely decompressed, not containing fluid. The ascending colon and cecum demonstrate no definite wall thickening, although there is an unusual appearance of lobulated fluid within the ascending colon (coronal image 50). As elsewhere, no colonic mesenteric stranding is identified. The appendix is normal, best seen on coronal images. Negative terminal ileum. Numerous decompressed small bowel loops in the pelvis are within normal limits. Oral contrast has only reached the proximal jejunum. No dilated small bowel. Negative stomach and duodenum. No abdominal free air or free fluid. Vascular/Lymphatic: Minimal atherosclerosis. Major arterial structures in the abdomen and pelvis are patent. Portal venous system is patent. No lymphadenopathy. Reproductive: Negative. Other: No pelvic free fluid. Musculoskeletal: Negative. IMPRESSION: 1. Generalized mild to moderate wall thickening throughout the colon suspicious for acute infectious colitis in this clinical setting. Superimposed unusual appearance of fluid or fluid collection within the ascending colon. Recommend follow-up evaluation, preferably with colonoscopy or virtual colonoscopy after the acute illness has resolved. 2. No associated bowel obstruction or abdominal free fluid. No other acute or inflammatory findings in the abdomen or pelvis. 3. Chronic renal scarring related to 2015  pyelonephritis. Electronically Signed   By: Genevie Ann M.D.   On: 01/09/2018 20:25    Procedures Procedures (including critical care time)  Medications Ordered in ED Medications  fentaNYL (SUBLIMAZE) injection 25-50 mcg (25 mcg Intravenous Given 01/11/18 0108)  famotidine (PEPCID) IVPB 20 mg in NS 100 mL IVPB (not administered)  aspirin EC tablet 81 mg (not administered)  atorvastatin (LIPITOR) tablet 20 mg (not administered)  carvedilol (COREG) tablet 25 mg (not administered)  lisinopril (PRINIVIL,ZESTRIL) tablet 10 mg (not administered)  ondansetron (ZOFRAN) tablet 4 mg (not administered)    Or  ondansetron (ZOFRAN) injection 4 mg (not administered)  potassium chloride 10 mEq in 100 mL IVPB (not administered)  0.9 %  sodium chloride infusion (not administered)  insulin aspart (novoLOG) injection 0-9 Units (not administered)  piperacillin-tazobactam (ZOSYN) IVPB 3.375 g (not administered)  sodium chloride 0.9 % bolus 1,000 mL (0 mLs Intravenous Stopped 01/10/18 2321)  sodium chloride 0.9 % bolus 1,000 mL (0 mLs Intravenous Stopped 01/11/18 0010)  potassium chloride 10 mEq in 100 mL IVPB (0 mEq Intravenous Stopped 01/11/18 0010)  ondansetron (ZOFRAN) injection 4 mg (4 mg Intravenous Given 01/10/18 2257)  magnesium sulfate IVPB 2 g 50 mL (0 g Intravenous Stopped 01/11/18 0010)     Initial Impression / Assessment and Plan / ED Course  I have reviewed the triage vital signs and the nursing notes.  Pertinent labs & imaging results that were available during my care of the patient were reviewed by me and considered in my medical decision making (see chart for details).      62 year old female with a history of diabetes, hypertension, hyperlipidemia, CHF, recent presentation with nausea, vomiting via, with evaluation in the ED yesterday including CT scan and fluids with diagnosis of likely infectious colitis, presents with concern for continued nausea, vomiting and generalized weakness despite  home antiemetics.  Patient with overall benign abdominal exam, and given recent CT imaging yesterday, have low suspicion for acute intra-abdominal process.  History, physical exam, imaging yesterday are consistent with colitis.  Patient has been unable to tolerate p.o. at home despite taking antiemetics.  She is persistently tachycardic in the emergency department, and  suspect dehydration secondary to diarrhea.  She is appropriate for inpatient management of her dehydration with continued fluid resuscitation and nausea control.  Final Clinical Impressions(s) / ED Diagnoses   Final diagnoses:  Diarrhea of presumed infectious origin  Dehydration    ED Discharge Orders    None       Gareth Morgan, MD 01/11/18 [phone]

## 2018-01-11 NOTE — Progress Notes (Signed)
ANTICOAGULATION CONSULT NOTE - Follow Up Consult  Pharmacy Consult for Warfarin Indication: LV thrombus/CVA  No Known Allergies  Patient Measurements: Height: 5\' 3"  (160 cm) Weight: 101 lb 13.6 oz (46.2 kg) IBW/kg (Calculated) : 52.4  Vital Signs: Temp: 98.1 F (36.7 C) (02/17 0542) Temp Source: Oral (02/17 0542) BP: 140/80 (02/17 0542) Pulse Rate: 89 (02/17 0542)  Labs: Recent Labs    01/09/18 1707 01/10/18 2045 01/11/18 0357  HGB 12.1 13.4 11.1*  HCT 37.9 40.1 33.3*  PLT 190 204 178  LABPROT  --  31.8* 35.9*  INR  --  3.12 3.64  CREATININE 1.09* 0.66 0.52    Estimated Creatinine Clearance: 53.2 mL/min (by C-G formula based on SCr of 0.52 mg/dL).  Assessment: 62 year old female presenting to ED with abdominal pain who has been receiving Cipro and Flagyl outpatient. Patient was on chronic Warfarin therapy for LV thrombus and CVA. Pharmacy was consulted to continue Warfarin therapy. Cipro and Flagyl were changed to Zosyn to avoid significant interaction between Cipro and Flagyl and Warfarin.   INR this AM is elevated further at 3.64 - likely due to recent Flagyl and Cipro.   Goal of Therapy:  INR 2-3 Monitor platelets by anticoagulation protocol: Yes   Plan:  No Warfarin tonight due to elevated INR.  Daily PT/INR.   Sloan Leiter, PharmD, BCPS, BCCCP Clinical Pharmacist Clinical phone 01/11/2018 until 3:[phone] After hours, please call [phone] 01/11/2018,7:14 AM

## 2018-01-11 NOTE — Progress Notes (Signed)
[facility] PROGRESS NOTE  Kristen Lozano  QJJ:[id number] DOB: 1956-05-04 DOA: 01/10/2018 PCP: Audley Hose, MD  Brief Narrative: Kristen Lozano is a 62 y.o. female with a history of chronic combined systolic and diastolic CHF, G8JE, HTN, hx CVA who presented to the ED due to refractory generalized abdominal pain, nausea, vomiting and diarrhea. She had been evaluated previously in the ED with abdominal CT showing generalized mild to moderate wall thickening throughout the colon concerning for acute infectious colitis. She had been discharged home with cipro/flagyl and phenergan, but returned 2/16 due to inability to keep any food, fluids, or medications down. On arrival she was afebrile, tachycardic with slightly elevated BP. Creatinine above baseline at 1.09, potassium low at 3.2. IV fluids and electrolyte repletion ordered. Zosyn was started and the patient was admitted early this morning by Dr. Myna Hidalgo.   Subjective: Emesis has continued despite antiemetics, potassium down to 2.6. On my arrival to her room she is actively heaving stomach contents without blood or bile. Having loose stools without blood. Abdominal pain is minimal. Denies dyspnea or swelling or chest pain.   Objective: BP (!) 156/93   Pulse 89   Temp 98.1 F (36.7 C) (Oral)   Resp 15   Ht 5\' 3"  (1.6 m)   Wt 46.2 kg (101 lb 13.6 oz)   SpO2 98%   BMI 18.04 kg/m   Gen: Frail thin female retching. Pulm: Clear and nonlabored on room air, no crackles CV: RRR, no murmur, no JVD, no peripheral edema GI: Soft, not tender to palpation, not distended. Normoactive bowel sounds.  Neuro: Alert and oriented.   Assessment & Plan: Colitis, presumed infectious with intractable nausea, vomiting and diarrhea: Failed outpatient management due to refractory nausea and vomiting. Afebrile, no leukocytosis, exam benign.  - Continue empiric zosyn started at admission.  - Take back to NPO except sips and chips for colon rest.  - IVF's  w/D5 (had starvation ketosis with mildly elevated anion gap without acidosis or significant hyperglycemia).  - Continue empiric contact precautions pending results of GI pathogen panel (needs to be sent).  - Continue zofran dose (hasn't received yet today), will add phenergan prn refractory symptoms  Hypokalemia: Worsened despite replacement due to GI losses.  - Empirically received magnesium, will recheck this in AM - Administered potassium po though do not believe this was down long enough to be absorbed. Will give 69mEq/L in IVF and recheck BMP in PM.   AKI: Improved with IV fluids.  - Monitor  Chronic combined systolic & diastolic CHF: Last echo Dec 2017 with EF 40-45%, G2DD. - Hypovolemic on admission in setting of diarrhea and loss of appetite, received 2L NS. Remains volume down, so will continue IV fluids at maintenance rate.  - Monitor strict I/O, daily weights.  - continue home medications.   LV thrombus:   - Continue warfarin. Appreciate pharmacy assistance dosing.   Hypertension:  - Continue home medications if tolerated and provide prn hydralazine for severe range HTN.   T2DM: HbA1c >15.5% in Dec 2017.  - Recheck HbA1c - Cover with SSI q4h and hold home medications for now.    Hx of CVA: - Continue aspirin and statin     Vance Gather, MD [facility] [email or web address] Password [facility] 01/11/2018, 12:31 PM

## 2018-01-12 ENCOUNTER — Encounter (HOSPITAL_COMMUNITY): Payer: Self-pay | Admitting: General Practice

## 2018-01-12 DIAGNOSIS — E860 Dehydration: Secondary | ICD-10-CM

## 2018-01-12 DIAGNOSIS — G8929 Other chronic pain: Secondary | ICD-10-CM

## 2018-01-12 LAB — CBC WITH DIFFERENTIAL/PLATELET
BASOS PCT: 2 %
Basophils Absolute: 0.1 10*3/uL (ref 0.0–0.1)
EOS ABS: 0 10*3/uL (ref 0.0–0.7)
EOS PCT: 0 %
HCT: 37.2 % (ref 36.0–46.0)
HEMOGLOBIN: 12.4 g/dL (ref 12.0–15.0)
LYMPHS PCT: 19 %
Lymphs Abs: 1.2 10*3/uL (ref 0.7–4.0)
MCH: 22.1 pg — AB (ref 26.0–34.0)
MCHC: 33.3 g/dL (ref 30.0–36.0)
MCV: 66.3 fL — AB (ref 78.0–100.0)
MONO ABS: 0.9 10*3/uL (ref 0.1–1.0)
Monocytes Relative: 14 %
NEUTROS ABS: 4.1 10*3/uL (ref 1.7–7.7)
NEUTROS PCT: 65 %
Platelets: 202 10*3/uL (ref 150–400)
RBC: 5.61 MIL/uL — ABNORMAL HIGH (ref 3.87–5.11)
RDW: 14.2 % (ref 11.5–15.5)
WBC: 6.3 10*3/uL (ref 4.0–10.5)

## 2018-01-12 LAB — GLUCOSE, CAPILLARY
GLUCOSE-CAPILLARY: 149 mg/dL — AB (ref 65–99)
GLUCOSE-CAPILLARY: 163 mg/dL — AB (ref 65–99)
GLUCOSE-CAPILLARY: 175 mg/dL — AB (ref 65–99)
GLUCOSE-CAPILLARY: 201 mg/dL — AB (ref 65–99)
Glucose-Capillary: 199 mg/dL — ABNORMAL HIGH (ref 65–99)
Glucose-Capillary: 174 mg/dL — ABNORMAL HIGH (ref 65–99)

## 2018-01-12 LAB — BASIC METABOLIC PANEL
Anion gap: 11 (ref 5–15)
CALCIUM: 8.5 mg/dL — AB (ref 8.9–10.3)
CO2: 25 mmol/L (ref 22–32)
Chloride: 102 mmol/L (ref 101–111)
Creatinine, Ser: 0.62 mg/dL (ref 0.44–1.00)
GFR calc Af Amer: >60 mL/min (ref >60)
GLUCOSE: 170 mg/dL — AB (ref 65–99)
Potassium: 3.1 mmol/L — ABNORMAL LOW (ref 3.5–5.1)
Sodium: 138 mmol/L (ref 135–145)

## 2018-01-12 LAB — PROTIME-INR
INR: 3.68
PROTHROMBIN TIME: 36.2 s — AB (ref 11.4–15.2)

## 2018-01-12 LAB — MAGNESIUM: Magnesium: 1.9 mg/dL (ref 1.7–2.4)

## 2018-01-12 MED ORDER — METOCLOPRAMIDE HCL 5 MG/ML IJ SOLN
5.0000 mg | Freq: Three times a day (TID) | INTRAMUSCULAR | Status: DC
  Administered 2018-01-12 – 2018-01-13 (×3): 5 mg via INTRAVENOUS
  Filled 2018-01-12 (×3): qty 2

## 2018-01-12 NOTE — Progress Notes (Signed)
PROGRESS NOTE  Kristen Lozano  FXT:[id number] DOB: 03/16/1956 DOA: 01/10/2018 PCP: Audley Hose, MD   Brief Narrative: Kristen Lozano is a 62 y.o. female with a history of chronic combined systolic and diastolic CHF, G9JM, HTN, hx CVA who presented to the ED due to refractory generalized abdominal pain, nausea, vomiting and diarrhea. She had been evaluated previously in the ED with abdominal CT showing generalized mild to moderate wall thickening throughout the colon concerning for acute infectious colitis. She had been discharged home with cipro/flagyl and phenergan, but returned 2/16 due to inability to keep any food, fluids, or medications down. On arrival she was afebrile, tachycardic with slightly elevated BP. Creatinine above baseline at 1.09, potassium low at 3.2. IV fluids and electrolyte repletion ordered. Zosyn was started and the patient has had resolution in diarrhea. she continues to have nausea and vomiting, but is requesting a diet.  Assessment & Plan: Colitis, presumed infectious with intractable nausea, vomiting and diarrhea: Failed outpatient management due to refractory nausea and vomiting. Afebrile, no leukocytosis, exam benign. Diarrhea has resolved since admission. Lipase and LFTs wnl. - Continue empiric zosyn started at admission.  - Advance to clear liquids, start reglan scheduled for ?gastroparesis. - Continue IVF's w/D5 (had starvation ketosis with mildly elevated anion gap without acidosis or significant hyperglycemia).  - Continue zofran and phenergan prn  Hematemesis: Minimal red specks in vomitus most consistent with MW tear. Gastritis, esophagitis, ulcer also possibilities.  - Continue acid suppression - No current indication to reverse anticoagulation despite slightly supratherapeutic INR. - Monitor CBC (no current anemia, hgb stable)  Hypokalemia: Improving - Continue IVF w/potassium.  AKI: Improved with IV fluids.  - Monitor  Chronic combined systolic  & diastolic CHF: Last echo Dec 2017 with EF 40-45%, G2DD. -Hypovolemic on admission in setting of diarrhea and loss of appetite, received 2L NS. Remains volume down and not reliably taking po, so will continue IV fluids at maintenance rate.  - Monitor strict I/O, daily weights.  - Continue home medications.   LV thrombus:  -Continue monitoring INR daily, holding coumadin with supratherapeutic INR (thought to be due to use of cipro/flagyl). Appreciate pharmacy assistance dosing.   Hypertension:  - Continue home medications if tolerated and provide prn hydralazine for severe range HTN.   T2DM: HbA1c >15.5% in Dec 2017, now down to 7.7%.  - Given poor control historically, would benefit from gastric emptying study once able to tolerate this.  - Cover with SSI q4h and hold home medications for now.   Hx of CVA: -Continue aspirin and statin  DVT prophylaxis: Coumadin Code Status: Full Family Communication: None at bedside, declined offer to call.  Disposition Plan: Home when improved.   Consultants:   None  Procedures:   None  Antimicrobials:  Zosyn 2/17 >>    Subjective: Feels nauseated and had emesis overnight. Still really wants to advance diet this morning. Abdominal pain is stable. No diarrhea since arrival.   Objective: Vitals:   01/11/18 1434 01/11/18 2023 01/12/18 0357 01/12/18 0627  BP: (!) 146/84 (!) 145/86 (!) 172/86 (!) 171/89  Pulse: 91 89 96   Resp:  15 17   Temp: 98.2 F (36.8 C) 98.6 F (37 C) 98.6 F (37 C)   TempSrc: Oral  Oral   SpO2: 100% 97% 98%   Weight:   47.4 kg (104 lb 8 oz)   Height:        Intake/Output Summary (Last 24 hours) at 01/12/2018 0932 Last data  filed at 01/12/2018 0500 Gross per 24 hour  Intake 1356 ml  Output -  Net 1356 ml   Filed Weights   01/11/18 0220 01/11/18 0542 01/12/18 0357  Weight: 46.2 kg (101 lb 13.6 oz) 46.2 kg (101 lb 13.6 oz) 47.4 kg (104 lb 8 oz)    Gen: Frail 62 y.o. female in no  distress Pulm: Non-labored breathing room air. Clear to auscultation bilaterally.  CV: Regular rate and rhythm. No murmur, rub, or gallop. No JVD, no pedal edema. GI: Abdomen soft, mildly tender diffusely without rebound, guarding or distention. + bowel sounds.   Ext: Warm, no deformities Skin: No rashes, lesions or ulcers Neuro: Alert and oriented. No focal neurological deficits. Psych: Judgement and insight appear normal. Mood & affect appropriate.   Data Reviewed: I have personally reviewed following labs and imaging studies  CBC: Recent Labs  Lab 01/09/18 1707 01/10/18 2045 01/11/18 0357 01/11/18 1920 01/12/18 0320  WBC 7.7 8.5 7.3 7.6 6.3  NEUTROABS 6.8  --  5.6 4.8 4.1  HGB 12.1 13.4 11.1* 12.3 12.4  HCT 37.9 40.1 33.3* 36.4 37.2  MCV 67.1* 66.6* 66.3* 66.1* 66.3*  PLT 190 204 178 211 732   Basic Metabolic Panel: Recent Labs  Lab 01/09/18 1707 01/10/18 2045 01/10/18 2307 01/11/18 0357 01/11/18 1700 01/12/18 0320  NA 140 139  --  138 137 138  K 3.6 3.2*  --  2.6* 2.8* 3.1*  CL 101 98*  --  101 101 102  CO2 23 25  --  24 26 25   GLUCOSE 264* 191*  --  134* 169* 170*  BUN 31* 11  --  6 5* <5*  CREATININE 1.09* 0.66  --  0.52 0.58 0.62  CALCIUM 9.6 9.6  --  8.1* 8.5* 8.5*  MG  --   --  1.7  --   --  1.9   GFR: Estimated Creatinine Clearance: 54.6 mL/min (by C-G formula based on SCr of 0.62 mg/dL). Liver Function Tests: Recent Labs  Lab 01/09/18 1707 01/10/18 2045  AST 49* 31  ALT 36 27  ALKPHOS 51 57  BILITOT 0.6 0.9  PROT 8.0 8.8*  ALBUMIN 3.8 4.0   Recent Labs  Lab 01/09/18 1707 01/10/18 2045  LIPASE 26 30   No results for input(s): AMMONIA in the last 168 hours. Coagulation Profile: Recent Labs  Lab 01/10/18 2045 01/11/18 0357 01/12/18 0320  INR 3.12 3.64 3.68   Cardiac Enzymes: No results for input(s): CKTOTAL, CKMB, CKMBINDEX, TROPONINI in the last 168 hours. BNP (last 3 results) No results for input(s): PROBNP in the last 8760  hours. HbA1C: Recent Labs    01/11/18 1700  HGBA1C 7.7*   CBG: Recent Labs  Lab 01/11/18 1542 01/11/18 2020 01/11/18 2328 01/12/18 0355 01/12/18 0837  GLUCAP 154* 125* 165* 175* 199*   Lipid Profile: No results for input(s): CHOL, HDL, LDLCALC, TRIG, CHOLHDL, LDLDIRECT in the last 72 hours. Thyroid Function Tests: No results for input(s): TSH, T4TOTAL, FREET4, T3FREE, THYROIDAB in the last 72 hours. Anemia Panel: No results for input(s): VITAMINB12, FOLATE, FERRITIN, TIBC, IRON, RETICCTPCT in the last 72 hours. Urine analysis:    Component Value Date/Time   COLORURINE YELLOW 01/10/2018 2350   APPEARANCEUR HAZY (A) 01/10/2018 2350   LABSPEC 1.013 01/10/2018 2350   PHURINE 6.0 01/10/2018 2350   GLUCOSEU 150 (A) 01/10/2018 2350   HGBUR SMALL (A) 01/10/2018 2350   BILIRUBINUR NEGATIVE 01/10/2018 2350   KETONESUR 20 (A) 01/10/2018 2350   PROTEINUR  NEGATIVE 01/10/2018 2350   UROBILINOGEN 1.0 09/07/2014 1405   NITRITE NEGATIVE 01/10/2018 2350   LEUKOCYTESUR MODERATE (A) 01/10/2018 2350   No results found for this or any previous visit (from the past 240 hour(s)).    Radiology Studies: No results found.  Scheduled Meds: . aspirin EC  81 mg Oral Daily  . atorvastatin  20 mg Oral q1800  . carvedilol  25 mg Oral BID WC  . famotidine (PEPCID) IV  20 mg Intravenous Q12H  . insulin aspart  0-9 Units Subcutaneous Q4H  . lisinopril  10 mg Oral Daily  . Warfarin - Pharmacist Dosing Inpatient   Does not apply q1800   Continuous Infusions: . dextrose 5 % and 0.9% NaCl 1,000 mL with potassium chloride 40 mEq infusion 85 mL/hr at 01/12/18 0058  . piperacillin-tazobactam (ZOSYN)  IV Stopped (01/12/18 0851)     LOS: 1 day   Time spent: 25 minutes.  Vance Gather, MD [facility] Pager [phone]  If 7PM-7AM, please contact night-coverage [email or web address] Password [facility] 01/12/2018, 9:32 AM

## 2018-01-12 NOTE — Progress Notes (Signed)
ANTICOAGULATION CONSULT NOTE - Follow Up Consult  Pharmacy Consult:  Coumadin Indication: LV thrombus/CVA  No Known Allergies  Patient Measurements: Height: 5\' 3"  (160 cm) Weight: 104 lb 8 oz (47.4 kg) IBW/kg (Calculated) : 52.4  Vital Signs: Temp: 98.6 F (37 C) (02/18 0357) Temp Source: Oral (02/18 0357) BP: 171/89 (02/18 0627) Pulse Rate: 96 (02/18 0357)  Labs: Recent Labs    01/10/18 2045 01/11/18 0357 01/11/18 1700 01/11/18 1920 01/12/18 0320  HGB 13.4 11.1*  --  12.3 12.4  HCT 40.1 33.3*  --  36.4 37.2  PLT 204 178  --  211 202  LABPROT 31.8* 35.9*  --   --  36.2*  INR 3.12 3.64  --   --  3.68  CREATININE 0.66 0.52 0.58  --  0.62    Estimated Creatinine Clearance: 54.6 mL/min (by C-G formula based on SCr of 0.62 mg/dL).   Assessment: 54 YOF presented to ED with abdominal pain.  Pharmacy consulted to continue Coumadin from PTA for history of  LV thrombus and CVA.  INR supra-therapeutic and appears to be leveling out.  She was on Cipro and Flagyl PTA, which likely is the cause of the elevated INR.  No bleeding reported.   Goal of Therapy:  INR 2-3 Monitor platelets by anticoagulation protocol: Yes    Plan:  Continue to hold Coumadin Daily PT / INR   Armari Fussell D. Mina Marble, PharmD, BCPS Pager:  [phone] 01/12/2018, 12:32 PM

## 2018-01-12 NOTE — Discharge Instructions (Signed)

## 2018-01-12 NOTE — H&P (Incomplete)
Patient very nauseated & unable to answer questions at this time

## 2018-01-13 ENCOUNTER — Other Ambulatory Visit: Payer: Self-pay

## 2018-01-13 DIAGNOSIS — R197 Diarrhea, unspecified: Secondary | ICD-10-CM

## 2018-01-13 LAB — CBC
HEMATOCRIT: 37.6 % (ref 36.0–46.0)
HEMOGLOBIN: 12.6 g/dL (ref 12.0–15.0)
MCH: 22.1 pg — ABNORMAL LOW (ref 26.0–34.0)
MCHC: 33.5 g/dL (ref 30.0–36.0)
MCV: 65.8 fL — ABNORMAL LOW (ref 78.0–100.0)
Platelets: 256 10*3/uL (ref 150–400)
RBC: 5.71 MIL/uL — ABNORMAL HIGH (ref 3.87–5.11)
RDW: 14.3 % (ref 11.5–15.5)
WBC: 6.6 10*3/uL (ref 4.0–10.5)

## 2018-01-13 LAB — COMPREHENSIVE METABOLIC PANEL
ALBUMIN: 3 g/dL — AB (ref 3.5–5.0)
ALK PHOS: 42 U/L (ref 38–126)
ALT: 20 U/L (ref 14–54)
ANION GAP: 12 (ref 5–15)
AST: 25 U/L (ref 15–41)
BUN: <5 mg/dL — ABNORMAL LOW (ref 6–20)
CALCIUM: 8.9 mg/dL (ref 8.9–10.3)
CO2: 25 mmol/L (ref 22–32)
Chloride: 100 mmol/L — ABNORMAL LOW (ref 101–111)
Creatinine, Ser: 0.57 mg/dL (ref 0.44–1.00)
GFR calc Af Amer: >60 mL/min (ref >60)
GFR calc non Af Amer: >60 mL/min (ref >60)
GLUCOSE: 178 mg/dL — AB (ref 65–99)
Potassium: 2.8 mmol/L — ABNORMAL LOW (ref 3.5–5.1)
SODIUM: 137 mmol/L (ref 135–145)
Total Bilirubin: 0.7 mg/dL (ref 0.3–1.2)
Total Protein: 7.2 g/dL (ref 6.5–8.1)

## 2018-01-13 LAB — PROTIME-INR
INR: 4.38
Prothrombin Time: 41.6 seconds — ABNORMAL HIGH (ref 11.4–15.2)

## 2018-01-13 LAB — GLUCOSE, CAPILLARY
GLUCOSE-CAPILLARY: 187 mg/dL — AB (ref 65–99)
Glucose-Capillary: 172 mg/dL — ABNORMAL HIGH (ref 65–99)
Glucose-Capillary: 198 mg/dL — ABNORMAL HIGH (ref 65–99)
Glucose-Capillary: 173 mg/dL — ABNORMAL HIGH (ref 65–99)
Glucose-Capillary: 138 mg/dL — ABNORMAL HIGH (ref 65–99)

## 2018-01-13 MED ORDER — POTASSIUM CHLORIDE 10 MEQ/100ML IV SOLN
10.0000 meq | INTRAVENOUS | Status: AC
  Administered 2018-01-13 (×5): 10 meq via INTRAVENOUS
  Filled 2018-01-13 (×5): qty 100

## 2018-01-13 MED ORDER — POTASSIUM CHLORIDE CRYS ER 20 MEQ PO TBCR: 40.0000 meq | EXTENDED_RELEASE_TABLET | ORAL | Status: DC

## 2018-01-13 MED ORDER — POTASSIUM CHLORIDE 2 MEQ/ML IV SOLN
INTRAVENOUS | Status: DC
  Administered 2018-01-13: 19:00:00 via INTRAVENOUS
  Filled 2018-01-13: qty 1000

## 2018-01-13 MED ORDER — ONDANSETRON HCL 4 MG/2ML IJ SOLN
4.0000 mg | Freq: Four times a day (QID) | INTRAMUSCULAR | Status: DC | PRN
  Administered 2018-01-13 (×2): 4 mg via INTRAVENOUS
  Filled 2018-01-13 (×2): qty 2

## 2018-01-13 MED ORDER — ONDANSETRON HCL 4 MG PO TABS: 4.0000 mg | ORAL_TABLET | Freq: Three times a day (TID) | ORAL | Status: DC | PRN

## 2018-01-13 NOTE — Progress Notes (Signed)
PT /INR reported to pharmacy

## 2018-01-13 NOTE — Progress Notes (Signed)
PROGRESS NOTE  Kristen Lozano  YBO:[id number] DOB: October 02, 1956 DOA: 01/10/2018 PCP: Audley Hose, MD   Brief Narrative: Kristen Lozano is a 62 y.o. female with a history of chronic combined systolic and diastolic CHF, I7PO, HTN, hx CVA who presented to the ED due to refractory generalized abdominal pain, nausea, vomiting and diarrhea. She had been evaluated previously in the ED with abdominal CT showing generalized mild to moderate wall thickening throughout the colon concerning for acute infectious colitis. She had been discharged home with cipro/flagyl and phenergan, but returned 2/16 due to inability to keep any food, fluids, or medications down. On arrival she was afebrile, tachycardic with slightly elevated BP. Creatinine above baseline at 1.09, potassium low at 3.2. IV fluids and electrolyte repletion ordered. Zosyn was started   Assessment & Plan:  Acute colitis/likely infectious -Admitted with diarrhea, vomiting and CT evidence of moderate: Wall thickening -Clinically improving started on IV Zosyn on admission -Advance diet to soft foods  -Supportive care, antiemetics,  -Replace potassium  -Follow-up GI pathogen panel  Hematemesis: Minimal red specks in vomitus on admission most consistent with MW tear. Gastritis, esophagitis, ulcer also possibilities.  -Resolved, hemoglobin stable, monitor  Hypokalemia:  -Due to diarrhea -Replace  AKI: Improved with IV fluids.  - Monitor  Chronic combined systolic & diastolic CHF: - Last echo Dec 2017 with EF 40-45%, G2DD. -Stop IV fluids, advance diet  LV thrombus:  -Continue monitoring INR daily, holding coumadin with supratherapeutic INR (thought to be due to use of cipro/flagyl).  -Per pharmacy  T2DM:  -HbA1c >15.5% in Dec 2017, now down to 7.7%.  -Continue home regimen, sliding scale insulin  Hx of CVA: -Continue aspirin and statin  DVT prophylaxis: Coumadin Code Status: Full Family Communication: None at  bedside Disposition Plan: Home when improved.   Consultants:   None  Procedures:   None  Antimicrobials:  Zosyn 2/17 >>    Subjective: -Diarrhea improving, some nausea, no further vomiting  Objective: Vitals:   01/12/18 1458 01/12/18 2111 01/13/18 0358 01/13/18 1437  BP: 93/60 (!) 163/88 (!) 173/95 136/68  Pulse: (!) 109 99 (!) 107 100  Resp: 16 16 16 16   Temp: (!) 100.4 F (38 C) 99.4 F (37.4 C) 99 F (37.2 C) 98.3 F (36.8 C)  TempSrc: Oral Oral Oral Oral  SpO2: 98% 100% 99% 100%  Weight:   47.7 kg (105 lb 2.6 oz)   Height:        Intake/Output Summary (Last 24 hours) at 01/13/2018 1441 Last data filed at 01/13/2018 1437 Gross per 24 hour  Intake 3000.91 ml  Output 1675 ml  Net 1325.91 ml   Filed Weights   01/11/18 0542 01/12/18 0357 01/13/18 0358  Weight: 46.2 kg (101 lb 13.6 oz) 47.4 kg (104 lb 8 oz) 47.7 kg (105 lb 2.6 oz)   Gen: Frail thinly built female, flat affect sitting up in chair HEENT: PERRLA, Neck supple, no JVD Lungs: Good air movement bilaterally, CTAB CVS: S1-S2/regular rate rhythm Abd: soft, Non tender, non distended, BS present Extremities: No Cyanosis, Clubbing or edema Skin: no new rashes Psych: Flat/depressed affect  Data Reviewed: I have personally reviewed following labs and imaging studies  CBC: Recent Labs  Lab 01/09/18 1707 01/10/18 2045 01/11/18 0357 01/11/18 1920 01/12/18 0320 01/13/18 0524  WBC 7.7 8.5 7.3 7.6 6.3 6.6  NEUTROABS 6.8  --  5.6 4.8 4.1  --   HGB 12.1 13.4 11.1* 12.3 12.4 12.6  HCT 37.9 40.1 33.3* 36.4  37.2 37.6  MCV 67.1* 66.6* 66.3* 66.1* 66.3* 65.8*  PLT 190 204 178 211 202 696   Basic Metabolic Panel: Recent Labs  Lab 01/10/18 2045 01/10/18 2307 01/11/18 0357 01/11/18 1700 01/12/18 0320 01/13/18 0524  NA 139  --  138 137 138 137  K 3.2*  --  2.6* 2.8* 3.1* 2.8*  CL 98*  --  101 101 102 100*  CO2 25  --  24 26 25 25   GLUCOSE 191*  --  134* 169* 170* 178*  BUN 11  --  6 5* <5* <5*    CREATININE 0.66  --  0.52 0.58 0.62 0.57  CALCIUM 9.6  --  8.1* 8.5* 8.5* 8.9  MG  --  1.7  --   --  1.9  --    GFR: Estimated Creatinine Clearance: 54.9 mL/min (by C-G formula based on SCr of 0.57 mg/dL). Liver Function Tests: Recent Labs  Lab 01/09/18 1707 01/10/18 2045 01/13/18 0524  AST 49* 31 25  ALT 36 27 20  ALKPHOS 51 57 42  BILITOT 0.6 0.9 0.7  PROT 8.0 8.8* 7.2  ALBUMIN 3.8 4.0 3.0*   Recent Labs  Lab 01/09/18 1707 01/10/18 2045  LIPASE 26 30   No results for input(s): AMMONIA in the last 168 hours. Coagulation Profile: Recent Labs  Lab 01/10/18 2045 01/11/18 0357 01/12/18 0320 01/13/18 0524  INR 3.12 3.64 3.68 4.38*   Cardiac Enzymes: No results for input(s): CKTOTAL, CKMB, CKMBINDEX, TROPONINI in the last 168 hours. BNP (last 3 results) No results for input(s): PROBNP in the last 8760 hours. HbA1C: Recent Labs    01/11/18 1700  HGBA1C 7.7*   CBG: Recent Labs  Lab 01/12/18 1956 01/12/18 2347 01/13/18 0354 01/13/18 0807 01/13/18 1224  GLUCAP 149* 163* 187* 172* 198*   Lipid Profile: No results for input(s): CHOL, HDL, LDLCALC, TRIG, CHOLHDL, LDLDIRECT in the last 72 hours. Thyroid Function Tests: No results for input(s): TSH, T4TOTAL, FREET4, T3FREE, THYROIDAB in the last 72 hours. Anemia Panel: No results for input(s): VITAMINB12, FOLATE, FERRITIN, TIBC, IRON, RETICCTPCT in the last 72 hours. Urine analysis:    Component Value Date/Time   COLORURINE YELLOW 01/10/2018 2350   APPEARANCEUR HAZY (A) 01/10/2018 2350   LABSPEC 1.013 01/10/2018 2350   PHURINE 6.0 01/10/2018 2350   GLUCOSEU 150 (A) 01/10/2018 2350   HGBUR SMALL (A) 01/10/2018 2350   BILIRUBINUR NEGATIVE 01/10/2018 2350   KETONESUR 20 (A) 01/10/2018 2350   PROTEINUR NEGATIVE 01/10/2018 2350   UROBILINOGEN 1.0 09/07/2014 1405   NITRITE NEGATIVE 01/10/2018 2350   LEUKOCYTESUR MODERATE (A) 01/10/2018 2350   No results found for this or any previous visit (from the past 240  hour(s)).    Radiology Studies: No results found.  Scheduled Meds: . aspirin EC  81 mg Oral Daily  . atorvastatin  20 mg Oral q1800  . carvedilol  25 mg Oral BID WC  . famotidine (PEPCID) IV  20 mg Intravenous Q12H  . insulin aspart  0-9 Units Subcutaneous Q4H  . lisinopril  10 mg Oral Daily  . Warfarin - Pharmacist Dosing Inpatient   Does not apply q1800   Continuous Infusions: . dextrose 5 % and 0.9% NaCl 1,000 mL with potassium chloride 40 mEq infusion 85 mL/hr at 01/13/18 0420  . piperacillin-tazobactam (ZOSYN)  IV 3.375 g (01/13/18 1348)  . potassium chloride Stopped (01/13/18 1348)     LOS: 2 days   Time spent: 25 minutes.  Domenic Polite, MD [facility]  Page via [email or web address] password [email or web address]  If 7PM-7AM, please contact night-coverage  01/13/2018, 2:41 PM

## 2018-01-13 NOTE — Progress Notes (Signed)
ANTICOAGULATION CONSULT NOTE - Follow Up Consult  Pharmacy Consult:  Coumadin Indication: LV thrombus/CVA  No Known Allergies  Patient Measurements: Height: 5\' 3"  (160 cm) Weight: 105 lb 2.6 oz (47.7 kg) IBW/kg (Calculated) : 52.4  Vital Signs: Temp: 99 F (37.2 C) (02/19 0358) Temp Source: Oral (02/19 0358) BP: 173/95 (02/19 0358) Pulse Rate: 107 (02/19 0358)  Labs: Recent Labs    01/11/18 0357 01/11/18 1700 01/11/18 1920 01/12/18 0320 01/13/18 0524  HGB 11.1*  --  12.3 12.4 12.6  HCT 33.3*  --  36.4 37.2 37.6  PLT 178  --  211 202 256  LABPROT 35.9*  --   --  36.2* 41.6*  INR 3.64  --   --  3.68 4.38*  CREATININE 0.52 0.58  --  0.62 0.57    Estimated Creatinine Clearance: 54.9 mL/min (by C-G formula based on SCr of 0.57 mg/dL).   Assessment: 38 YOF presented to ED with abdominal pain.  Pharmacy consulted to continue Coumadin from PTA for history of  LV thrombus and CVA.  INR supra-therapeutic and trended up further today (no Coumadin given since admission).  She was on interacting medications Cipro and Flagyl and last doses were on 01/09/18.  Condition worsened by minimal to no PO intake.  No bleeding reported.   Goal of Therapy:  INR 2-3 Monitor platelets by anticoagulation protocol: Yes    Plan:  Continue to hold Coumadin Daily PT / INR   Tapanga Ottaway D. Mina Marble, PharmD, BCPS Pager:  [phone] 01/13/2018, 9:46 AM

## 2018-01-14 LAB — BASIC METABOLIC PANEL
Anion gap: 11 (ref 5–15)
CALCIUM: 8.7 mg/dL — AB (ref 8.9–10.3)
CHLORIDE: 99 mmol/L — AB (ref 101–111)
CO2: 23 mmol/L (ref 22–32)
CREATININE: 0.56 mg/dL (ref 0.44–1.00)
GFR calc Af Amer: >60 mL/min (ref >60)
GFR calc non Af Amer: >60 mL/min (ref >60)
GLUCOSE: 146 mg/dL — AB (ref 65–99)
Potassium: 3.1 mmol/L — ABNORMAL LOW (ref 3.5–5.1)
Sodium: 133 mmol/L — ABNORMAL LOW (ref 135–145)

## 2018-01-14 LAB — GLUCOSE, CAPILLARY
GLUCOSE-CAPILLARY: 123 mg/dL — AB (ref 65–99)
GLUCOSE-CAPILLARY: 130 mg/dL — AB (ref 65–99)
GLUCOSE-CAPILLARY: 137 mg/dL — AB (ref 65–99)
GLUCOSE-CAPILLARY: 144 mg/dL — AB (ref 65–99)
Glucose-Capillary: 145 mg/dL — ABNORMAL HIGH (ref 65–99)
Glucose-Capillary: 163 mg/dL — ABNORMAL HIGH (ref 65–99)
Glucose-Capillary: 173 mg/dL — ABNORMAL HIGH (ref 65–99)

## 2018-01-14 LAB — PROTIME-INR
INR: 3.92
Prothrombin Time: 38.1 seconds — ABNORMAL HIGH (ref 11.4–15.2)

## 2018-01-14 LAB — CBC
HEMATOCRIT: 37.5 % (ref 36.0–46.0)
HEMOGLOBIN: 12.6 g/dL (ref 12.0–15.0)
MCH: 22 pg — AB (ref 26.0–34.0)
MCHC: 33.6 g/dL (ref 30.0–36.0)
MCV: 65.3 fL — AB (ref 78.0–100.0)
Platelets: 275 10*3/uL (ref 150–400)
RBC: 5.74 MIL/uL — ABNORMAL HIGH (ref 3.87–5.11)
RDW: 14.2 % (ref 11.5–15.5)
WBC: 7 10*3/uL (ref 4.0–10.5)

## 2018-01-14 MED ORDER — AMOXICILLIN-POT CLAVULANATE 875-125 MG PO TABS
1.0000 | ORAL_TABLET | Freq: Two times a day (BID) | ORAL | Status: DC
  Administered 2018-01-14 – 2018-01-15 (×3): 1 via ORAL
  Filled 2018-01-14 (×3): qty 1

## 2018-01-14 MED ORDER — POTASSIUM CHLORIDE CRYS ER 20 MEQ PO TBCR
40.0000 meq | EXTENDED_RELEASE_TABLET | Freq: Once | ORAL | Status: AC
  Administered 2018-01-14: 40 meq via ORAL
  Filled 2018-01-14: qty 2

## 2018-01-14 NOTE — Progress Notes (Addendum)
ANTICOAGULATION CONSULT NOTE - Follow Up Consult  Pharmacy Consult:  Coumadin Indication: LV thrombus/CVA  No Known Allergies  Patient Measurements: Height: 5\' 3"  (160 cm) Weight: 104 lb 15 oz (47.6 kg) IBW/kg (Calculated) : 52.4  Vital Signs: Temp: 98.3 F (36.8 C) (02/20 0511) Temp Source: Oral (02/20 0511) BP: 154/88 (02/20 0511) Pulse Rate: 90 (02/20 0511)  Labs: Recent Labs    01/12/18 0320 01/13/18 0524 01/14/18 0445  HGB 12.4 12.6 12.6  HCT 37.2 37.6 37.5  PLT 202 256 275  LABPROT 36.2* 41.6* 38.1*  INR 3.68 4.38* 3.92  CREATININE 0.62 0.57 0.56    Estimated Creatinine Clearance: 54.8 mL/min (by C-G formula based on SCr of 0.56 mg/dL).   Assessment: 80 YOF presented to ED with abdominal pain.  Pharmacy consulted to continue Coumadin from PTA for history of  LV thrombus and CVA.  INR supra-therapeutic on admit, peaked at 4.38 and now trending down (no Coumadin given since admission).  She was on interacting medications Cipro and Flagyl and last doses were on 01/09/18.  PO intake remains inadequate.  No bleeding reported.   Goal of Therapy:  INR 2-3 Monitor platelets by anticoagulation protocol: Yes    Plan:  Continue to hold Coumadin Daily PT / INR F/U KCL supplementation, abx LOT   Senia Even D. Mina Marble, PharmD, BCPS Pager:  [phone] 01/14/2018, 8:52 AM

## 2018-01-14 NOTE — Progress Notes (Signed)
PROGRESS NOTE  Kristen Lozano  GGE:[id number] DOB: 05-06-1956 DOA: 01/10/2018 PCP: Audley Hose, MD   Brief Narrative: Kristen Lozano is a 62 y.o. female with a history of chronic combined systolic and diastolic CHF, Y6TK, HTN, hx CVA who presented to the ED due to refractory generalized abdominal pain, nausea, vomiting and diarrhea. She had been evaluated previously in the ED with abdominal CT showing generalized mild to moderate wall thickening throughout the colon concerning for acute infectious colitis. She had been discharged home with cipro/flagyl and phenergan, but returned 2/16 due to inability to keep any food, fluids, or medications down. On arrival she was afebrile, tachycardic with slightly elevated BP. Creatinine above baseline at 1.09, potassium low at 3.2. IV fluids and electrolyte repletion ordered. Zosyn was started   Assessment & Plan:  Acute colitis/likely infectious -Admitted with diarrhea, vomiting and CT evidence of moderate: Wall thickening -Clinically improving, diarrhea has resolved, started on IV Zosyn on admission -Unfortunately PO intake is minimal to none -Transitioned to oral Augmentin today, I encouraged ensure and other foods today  Situational depression/grief -Spouse currently on a ventilator following massive accident -No suicidal thoughts or ideations, had long discussion with patient about this  Hematemesis:  transient Minimal red specks in vomitus on admission most consistent with MW tear. -Resolved, hemoglobin stable, monitor  Hypokalemia:  -Due to diarrhea -Replaced  AKI: Improved with IV fluids.  - Monitor  Chronic combined systolic & diastolic CHF: - Last echo Dec 2017 with EF 40-45%, G2DD. -Stopped IV fluids, advance diet  -encouraged oral intake   LV thrombus:  -Continue monitoring INR daily, INRs supratherapeutic likely secondary to diarrhea & vitamin K deficiency   T2DM:  -HbA1c >15.5% in Dec 2017, now down to 7.7%.    -Continue home regimen, sliding scale insulin  Hx of CVA: -Continue aspirin and statin  DVT prophylaxis: Coumadin Code Status: Full Family Communication: None at bedside Disposition Plan: Home when improved,  hopefully tomorrow   Consultants:   None  Procedures:   None  Antimicrobials:  Zosyn 2/17 >>    Subjective: -Diarrhea improving, some nausea, no further vomiting  Objective: Vitals:   01/13/18 2059 01/14/18 0429 01/14/18 0511 01/14/18 1044  BP: (!) 164/99  (!) 154/88 (!) 142/83  Pulse: 95  90 99  Resp: 16  17   Temp: 98.4 F (36.9 C)  98.3 F (36.8 C)   TempSrc: Oral  Oral   SpO2: 98%  98% 99%  Weight:  47.6 kg (104 lb 15 oz)    Height:        Intake/Output Summary (Last 24 hours) at 01/14/2018 1459 Last data filed at 01/14/2018 3546 Gross per 24 hour  Intake 1080.83 ml  Output 1100 ml  Net -19.17 ml   Filed Weights   01/12/18 0357 01/13/18 0358 01/14/18 0429  Weight: 47.4 kg (104 lb 8 oz) 47.7 kg (105 lb 2.6 oz) 47.6 kg (104 lb 15 oz)   Gen: Frail thinly built female, flat affect sitting up in chair HEENT: PERRLA, Neck supple, no JVD Lungs: Good air movement bilaterally, CTAB CVS: S1-S2/regular rate rhythm Abd: soft, Non tender, non distended, BS present Extremities: No Cyanosis, Clubbing or edema Skin: no new rashes Psych: Flat/depressed affect  Data Reviewed: I have personally reviewed following labs and imaging studies  CBC: Recent Labs  Lab 01/09/18 1707  01/11/18 0357 01/11/18 1920 01/12/18 0320 01/13/18 0524 01/14/18 0445  WBC 7.7   < > 7.3 7.6 6.3 6.6 7.0  NEUTROABS 6.8  --  5.6 4.8 4.1  --   --   HGB 12.1   < > 11.1* 12.3 12.4 12.6 12.6  HCT 37.9   < > 33.3* 36.4 37.2 37.6 37.5  MCV 67.1*   < > 66.3* 66.1* 66.3* 65.8* 65.3*  PLT 190   < > 178 211 202 256 275   < > = values in this interval not displayed.   Basic Metabolic Panel: Recent Labs  Lab 01/10/18 2307 01/11/18 0357 01/11/18 1700 01/12/18 0320 01/13/18 0524  01/14/18 0445  NA  --  138 137 138 137 133*  K  --  2.6* 2.8* 3.1* 2.8* 3.1*  CL  --  101 101 102 100* 99*  CO2  --  24 26 25 25 23   GLUCOSE  --  134* 169* 170* 178* 146*  BUN  --  6 5* <5* <5* <5*  CREATININE  --  0.52 0.58 0.62 0.57 0.56  CALCIUM  --  8.1* 8.5* 8.5* 8.9 8.7*  MG 1.7  --   --  1.9  --   --    GFR: Estimated Creatinine Clearance: 54.8 mL/min (by C-G formula based on SCr of 0.56 mg/dL). Liver Function Tests: Recent Labs  Lab 01/09/18 1707 01/10/18 2045 01/13/18 0524  AST 49* 31 25  ALT 36 27 20  ALKPHOS 51 57 42  BILITOT 0.6 0.9 0.7  PROT 8.0 8.8* 7.2  ALBUMIN 3.8 4.0 3.0*   Recent Labs  Lab 01/09/18 1707 01/10/18 2045  LIPASE 26 30   No results for input(s): AMMONIA in the last 168 hours. Coagulation Profile: Recent Labs  Lab 01/10/18 2045 01/11/18 0357 01/12/18 0320 01/13/18 0524 01/14/18 0445  INR 3.12 3.64 3.68 4.38* 3.92   Cardiac Enzymes: No results for input(s): CKTOTAL, CKMB, CKMBINDEX, TROPONINI in the last 168 hours. BNP (last 3 results) No results for input(s): PROBNP in the last 8760 hours. HbA1C: Recent Labs    01/11/18 1700  HGBA1C 7.7*   CBG: Recent Labs  Lab 01/13/18 2000 01/14/18 0016 01/14/18 0356 01/14/18 0800 01/14/18 1205  GLUCAP 138* 163* 137* 145* 173*   Lipid Profile: No results for input(s): CHOL, HDL, LDLCALC, TRIG, CHOLHDL, LDLDIRECT in the last 72 hours. Thyroid Function Tests: No results for input(s): TSH, T4TOTAL, FREET4, T3FREE, THYROIDAB in the last 72 hours. Anemia Panel: No results for input(s): VITAMINB12, FOLATE, FERRITIN, TIBC, IRON, RETICCTPCT in the last 72 hours. Urine analysis:    Component Value Date/Time   COLORURINE YELLOW 01/10/2018 2350   APPEARANCEUR HAZY (A) 01/10/2018 2350   LABSPEC 1.013 01/10/2018 2350   PHURINE 6.0 01/10/2018 2350   GLUCOSEU 150 (A) 01/10/2018 2350   HGBUR SMALL (A) 01/10/2018 2350   BILIRUBINUR NEGATIVE 01/10/2018 2350   KETONESUR 20 (A) 01/10/2018 2350    PROTEINUR NEGATIVE 01/10/2018 2350   UROBILINOGEN 1.0 09/07/2014 1405   NITRITE NEGATIVE 01/10/2018 2350   LEUKOCYTESUR MODERATE (A) 01/10/2018 2350   No results found for this or any previous visit (from the past 240 hour(s)).    Radiology Studies: No results found.  Scheduled Meds: . amoxicillin-clavulanate  1 tablet Oral Q12H  . aspirin EC  81 mg Oral Daily  . atorvastatin  20 mg Oral q1800  . carvedilol  25 mg Oral BID WC  . famotidine (PEPCID) IV  20 mg Intravenous Q12H  . insulin aspart  0-9 Units Subcutaneous Q4H  . lisinopril  10 mg Oral Daily  . Warfarin - Pharmacist Dosing Inpatient  Does not apply q1800   Continuous Infusions:    LOS: 3 days   Time spent: 25 minutes.  Domenic Polite, MD [facility] Page via [email or web address] password [email or web address]  If 7PM-7AM, please contact night-coverage  01/14/2018, 2:59 PM

## 2018-01-15 LAB — PROTIME-INR
INR: 2.99
PROTHROMBIN TIME: 30.8 s — AB (ref 11.4–15.2)

## 2018-01-15 LAB — GLUCOSE, CAPILLARY
Glucose-Capillary: 102 mg/dL — ABNORMAL HIGH (ref 65–99)
Glucose-Capillary: 104 mg/dL — ABNORMAL HIGH (ref 65–99)

## 2018-01-15 MED ORDER — WARFARIN SODIUM 1 MG PO TABS
1.0000 mg | ORAL_TABLET | Freq: Once | ORAL | Status: DC
  Filled 2018-01-15: qty 1

## 2018-01-15 MED ORDER — AMOXICILLIN-POT CLAVULANATE 250-62.5 MG/5ML PO SUSR: 500.0000 mg | Freq: Two times a day (BID) | ORAL | 0 refills | Status: AC

## 2018-01-15 MED ORDER — AMOXICILLIN-POT CLAVULANATE 875-125 MG PO TABS: 1.0000 | ORAL_TABLET | Freq: Two times a day (BID) | ORAL | 0 refills | Status: DC

## 2018-01-15 MED ORDER — ONDANSETRON HCL 4 MG/5ML PO SOLN
4.0000 mg | Freq: Three times a day (TID) | ORAL | 0 refills | Status: DC | PRN
Start: 2018-01-15 — End: 2018-08-17

## 2018-01-15 MED ORDER — FAMOTIDINE 20 MG PO TABS: 20.0000 mg | ORAL_TABLET | Freq: Two times a day (BID) | ORAL | Status: DC

## 2018-01-15 NOTE — Discharge Summary (Signed)
Physician Discharge Summary  Kristen Lozano QPY:[id number] DOB: 08-13-1956 DOA: 01/10/2018  PCP: Audley Hose, MD  Admit date: 01/10/2018 Discharge date: 01/15/2018  Time spent: 35 minutes  Recommendations for Outpatient Follow-up:  1. PCP in 1 week 2. Please check INR in 1 week 3. 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 She had been evaluated previously in the ED with abdominal CT showinggeneralized mild to moderate wall thickening throughout the colon concerning for acute infectious colitis  Hospital Course:   Acute colitis/likely infectious -Admitted with diarrhea, vomiting and CT evidence of moderate: Wall thickening of colon -Clinically improving, diarrhea has resolved, started on IV Zosyn on admission -Unfortunately PO intake remains poor, which is a chronic problem per family, ensure encouraged -Transitioned to oral Augmentin yesterday for 83moe days to complete course -pt adamant to go home, spouse is hospitalized and critically ill following an accident at this time and she is felt to be overall stable for discharge  Situational depression/grief -Spouse currently on a ventilator  following massive accident -No suicidal thoughts or ideations, had long discussion with patient about this  Hypokalemia:  -Due to diarrhea -Replaced  AKI: Improved with IV fluids.   Chronic combined systolic & diastolic CHF: - Last echo Dec 2017 with EF 40-45%, G2DD. -compensated, was dry on admission due to severe diarrhea, continued on coreg and lisinopril -euvolemic at discharge, not on diuretics at baseline and considering very poor Po intake this is likely not going to be a problem  LV thrombus: -INRs supratherapeutic initially likely secondary to diarrhea & vitamin K deficiency , improved 2.9 at discharge today -resume Coumadin  T2DM:  -HbA1c >15.5% in Dec 2017, now down to 7.7%.  -Continue home regimen, sliding scale insulin  Hx of CVA: -Continue aspirin and statin  DYsphagia -chronic long standing problem per family -multifactorial in part due to stroke and had GI workup in the past too, takes most of foods crushed with apple sauce and eats soft foods   Discharge Exam: Vitals:   01/15/18 0507 01/15/18 1015  BP: (!) 145/80 115/72  Pulse: (!) 110   Resp: 17   Temp: 99.3 F (37.4 C)   SpO2: 100%     General: AAOx3 Cardiovascular: S1S2/RRR Respiratory: CTAB  Discharge Instructions   Discharge Instructions    Diet - low sodium heart healthy   Complete by:  As directed    Diet Carb Modified   Complete by:  As directed    Increase activity slowly   Complete by:  As directed      Allergies as of 01/15/2018   No Known Allergies     Medication List    STOP taking these  medications   ciprofloxacin 500 MG tablet Commonly known as:  CIPRO   metroNIDAZOLE 500 MG tablet Commonly known as:  FLAGYL     TAKE these medications   amoxicillin-clavulanate 250-62.5 MG/5ML suspension Commonly known as:  AUGMENTIN Take 10 mLs (500 mg total) by mouth 2 (two) times daily for 3 days.   aspirin 81 MG tablet Take 1 tablet (81 mg total) by mouth  daily.   atorvastatin 20 MG tablet Commonly known as:  LIPITOR Take 1 tablet (20 mg total) by mouth daily at 6 PM. Please schedule appointment for refills.   carvedilol 25 MG tablet Commonly known as:  COREG Take 1 tablet (25 mg total) by mouth 2 (two) times daily with a meal. Please schedule appointment for refills.   lisinopril 10 MG tablet Commonly known as:  PRINIVIL,ZESTRIL Take 1 tablet (10 mg total) by mouth daily.   metFORMIN 1000 MG tablet Commonly known as:  GLUCOPHAGE Take 1 tablet (1,000 mg total) by mouth 2 (two) times daily with a meal.   ondansetron 4 MG/5ML solution Commonly known as:  ZOFRAN Take 5 mLs (4 mg total) by mouth every 8 (eight) hours as needed for nausea or vomiting.   promethazine 12.5 MG tablet Commonly known as:  PHENERGAN Take 1 tablet (12.5 mg total) by mouth every 6 (six) hours as needed for nausea or vomiting.   sitaGLIPtin 100 MG tablet Commonly known as:  JANUVIA Take 1 tablet (100 mg total) by mouth daily.   TRADJENTA 5 MG Tabs tablet Generic drug:  linagliptin Take 5 mg by mouth daily.   TRULICITY 0.30 SP/2.3RA Sopn Generic drug:  Dulaglutide Inject 0.75 mg as directed once a week. Tuesday   warfarin 5 MG tablet Commonly known as:  COUMADIN Take as directed. If you are unsure how to take this medication, talk to your nurse or doctor. Original instructions:  Tale 1 tablet (24m) daily except 1.5  Tablets (7.569m every Monday OR as directed by coumadin clinic What changed:    how much to take  how to take this  when to take this  additional instructions      No Known Allergies Follow-up Information    Bakare, Mobolaji B, MD. Schedule an appointment as soon as possible for a visit in 1 week(s).   Specialty:  Internal Medicine Contact information: [address] [phone]          The results of significant diagnostics from this hospitalization (including imaging,  microbiology, ancillary and laboratory) are listed below for reference.    Significant Diagnostic Studies: Ct Abdomen Pelvis W Contrast  Result Date: 01/09/2018 CLINICAL DATA:  [age]ear old female with generalized abdominal pain, nausea vomiting diarrhea beginning yesterday. EXAM: CT ABDOMEN AND PELVIS WITH CONTRAST TECHNIQUE: Multidetector CT imaging of the abdomen and pelvis was performed using the standard protocol following bolus administration of intravenous contrast. CONTRAST:  7566mSOVUE-300 IOPAMIDOL (ISOVUE-300) INJECTION 61% COMPARISON:  Abdomen MRI and MRCP 03/08/2015. CT chest abdomen and Pelvis 09/07/2014. FINDINGS: Lower chest: Mild respiratory motion at the lung bases which appear clear. Stable cardiac size since 2015, borderline to mild cardiac enlargement. No pericardial or pleural effusion. Hepatobiliary: Negative gallbladder. Stable biliary tree. Normal liver enhancement. Pancreas: Negative. Spleen: Negative. Adrenals/Urinary Tract: Normal adrenal glands. Scattered areas of left greater than right chronic renal cortical scarring corresponds to areas of pyelonephritis on the 2015 comparison. Bilateral renal enhancement and contrast excretion today is within normal limits. No perinephric stranding  or hydronephrosis. Decompressed and unremarkable urinary bladder. Stomach/Bowel: The rectosigmoid colon appears decompressed but at the same time partially fluid-filled with suggestion of generalized mild wall thickening (series 3, image 67). No mesenteric stranding is associated. There is a similar appearance of the descending colon. The transverse colon is completely decompressed, not containing fluid. The ascending colon and cecum demonstrate no definite wall thickening, although there is an unusual appearance of lobulated fluid within the ascending colon (coronal image 50). As elsewhere, no colonic mesenteric stranding is identified. The appendix is normal, best seen on coronal images. Negative  terminal ileum. Numerous decompressed small bowel loops in the pelvis are within normal limits. Oral contrast has only reached the proximal jejunum. No dilated small bowel. Negative stomach and duodenum. No abdominal free air or free fluid. Vascular/Lymphatic: Minimal atherosclerosis. Major arterial structures in the abdomen and pelvis are patent. Portal venous system is patent. No lymphadenopathy. Reproductive: Negative. Other: No pelvic free fluid. Musculoskeletal: Negative. IMPRESSION: 1. Generalized mild to moderate wall thickening throughout the colon suspicious for acute infectious colitis in this clinical setting. Superimposed unusual appearance of fluid or fluid collection within the ascending colon. Recommend follow-up evaluation, preferably with colonoscopy or virtual colonoscopy after the acute illness has resolved. 2. No associated bowel obstruction or abdominal free fluid. No other acute or inflammatory findings in the abdomen or pelvis. 3. Chronic renal scarring related to 2015 pyelonephritis. Electronically Signed   By: Genevie Ann M.D.   On: 01/09/2018 20:25    Microbiology: No results found for this or any previous visit (from the past 240 hour(s)).   Labs: Basic Metabolic Panel: Recent Labs  Lab 01/10/18 2307 01/11/18 0357 01/11/18 1700 01/12/18 0320 01/13/18 0524 01/14/18 0445  NA  --  138 137 138 137 133*  K  --  2.6* 2.8* 3.1* 2.8* 3.1*  CL  --  101 101 102 100* 99*  CO2  --  24 26 25 25 23   GLUCOSE  --  134* 169* 170* 178* 146*  BUN  --  6 5* <5* <5* <5*  CREATININE  --  0.52 0.58 0.62 0.57 0.56  CALCIUM  --  8.1* 8.5* 8.5* 8.9 8.7*  MG 1.7  --   --  1.9  --   --    Liver Function Tests: Recent Labs  Lab 01/09/18 1707 01/10/18 2045 01/13/18 0524  AST 49* 31 25  ALT 36 27 20  ALKPHOS 51 57 42  BILITOT 0.6 0.9 0.7  PROT 8.0 8.8* 7.2  ALBUMIN 3.8 4.0 3.0*   Recent Labs  Lab 01/09/18 1707 01/10/18 2045  LIPASE 26 30   No results for input(s): AMMONIA in the  last 168 hours. CBC: Recent Labs  Lab 01/09/18 1707  01/11/18 0357 01/11/18 1920 01/12/18 0320 01/13/18 0524 01/14/18 0445  WBC 7.7   < > 7.3 7.6 6.3 6.6 7.0  NEUTROABS 6.8  --  5.6 4.8 4.1  --   --   HGB 12.1   < > 11.1* 12.3 12.4 12.6 12.6  HCT 37.9   < > 33.3* 36.4 37.2 37.6 37.5  MCV 67.1*   < > 66.3* 66.1* 66.3* 65.8* 65.3*  PLT 190   < > 178 211 202 256 275   < > = values in this interval not displayed.   Cardiac Enzymes: No results for input(s): CKTOTAL, CKMB, CKMBINDEX, TROPONINI in the last 168 hours. BNP: BNP (last 3 results) No results for input(s): BNP in the last 8760 hours.  ProBNP (  last 3 results) No results for input(s): PROBNP in the last 8760 hours.  CBG: Recent Labs  Lab 01/14/18 1627 01/14/18 2117 01/15/18 0001 01/15/18 0424 01/15/18 0850  GLUCAP 144* 123* 130* 102* 104*       Signed:  Domenic Polite MD.  [facility] 01/15/2018, 3:21 PM

## 2018-01-15 NOTE — Progress Notes (Signed)
ANTICOAGULATION CONSULT NOTE - Follow Up Consult  Pharmacy Consult:  Coumadin Indication: LV thrombus/CVA  No Known Allergies  Patient Measurements: Height: 5\' 3"  (160 cm) Weight: 104 lb 15 oz (47.6 kg) IBW/kg (Calculated) : 52.4  Vital Signs: Temp: 99.3 F (37.4 C) (02/21 0507) Temp Source: Oral (02/21 0507) BP: 115/72 (02/21 1015) Pulse Rate: 110 (02/21 0507)  Labs: Recent Labs    01/13/18 0524 01/14/18 0445 01/15/18 0542  HGB 12.6 12.6  --   HCT 37.6 37.5  --   PLT 256 275  --   LABPROT 41.6* 38.1* 30.8*  INR 4.38* 3.92 2.99  CREATININE 0.57 0.56  --     Estimated Creatinine Clearance: 54.8 mL/min (by C-G formula based on SCr of 0.56 mg/dL).   Assessment: 40 YOF presented to ED with abdominal pain.  Pharmacy consulted to continue Coumadin from PTA for history of  LV thrombus and CVA.  INR supra-therapeutic on admit, peaked at 4.38 and now trending down (no Coumadin given since admission).  She was on interacting medications Cipro and Flagyl and last doses were on 01/09/18.  PO intake remains inadequate.  No bleeding reported.  HOME REGIMEN:  Warfarin 5mg  daily except 7.5mg  on Mon/Friday.  2/21 INR - 2.99 and just under desired goal range.  She is without noted bleeding complications.  Goal of Therapy:  INR 2-3 Monitor platelets by anticoagulation protocol: Yes   Plan:  Will give low dose Warfarin today to keep her from falling below goal range. Daily PT / INR F/U KCL supplementation, abx LOT  Rober Minion, PharmD., MS Clinical Pharmacist Pager:  [phone] Thank you for allowing pharmacy to be part of this patients care team. 01/15/2018, 10:53 AM

## 2018-01-15 NOTE — Progress Notes (Signed)
Discharge instructions reviewed with the Pt and her sister.  Pt given her prescriptions for discharge.  Pt going home with her sister and other person Pt and sister had no questions,  Discharge instructions given to pt, along with her prescriptions

## 2018-01-22 ENCOUNTER — Other Ambulatory Visit: Payer: Self-pay | Admitting: Physician Assistant

## 2018-01-22 ENCOUNTER — Ambulatory Visit (INDEPENDENT_AMBULATORY_CARE_PROVIDER_SITE_OTHER): Payer: Medicare Other | Admitting: *Deleted

## 2018-01-22 DIAGNOSIS — Z7901 Long term (current) use of anticoagulants: Secondary | ICD-10-CM | POA: Diagnosis not present

## 2018-01-22 DIAGNOSIS — Z5181 Encounter for therapeutic drug level monitoring: Secondary | ICD-10-CM | POA: Diagnosis not present

## 2018-01-22 DIAGNOSIS — I236 Thrombosis of atrium, auricular appendage, and ventricle as current complications following acute myocardial infarction: Secondary | ICD-10-CM

## 2018-01-22 LAB — POCT INR: INR: 2.6

## 2018-01-22 NOTE — Patient Instructions (Addendum)
Description   Continue taking 1.5 pills everyday, which is the dose you have been taking since discharge. Recheck in 2 weeks.  [facility] [phone] Main [phone]

## 2018-02-20 ENCOUNTER — Other Ambulatory Visit: Payer: Self-pay | Admitting: Internal Medicine

## 2018-02-25 ENCOUNTER — Other Ambulatory Visit: Payer: Self-pay | Admitting: Internal Medicine

## 2018-03-03 ENCOUNTER — Telehealth: Payer: Self-pay | Admitting: Internal Medicine

## 2018-03-03 NOTE — Telephone Encounter (Signed)
New Message:     Curt Bears ([facility])states she has received clearance for this pt but is confused about some of it and needs clarification on the clearance. Curt Bears has some more questions that are on their clearance for but not on ours. Curt Bears needs to know how to proceed about not stopping the pt's medication recommended on the form that they would like to hold for the procedure.

## 2018-03-03 NOTE — Telephone Encounter (Signed)
Returned call to [employer]. She is needing clarification on a clearance request from 09/17/2017. She states patient is having scaling, root planning which involves going 2-53mm below gum line. She has multiple questions about patient's clearance - pre-meds, OK to hold warfarin, etc. Asked that Curt Bears fax a clearance request to [phone] with all the specifications of the clearance, since patient has not been seen in >1 year. She asked if this could be addressed & faxed back today and I told her this was unlikely.

## 2018-03-03 NOTE — Telephone Encounter (Signed)
   [facility] Pre-operative Risk Assessment    Request for surgical clearance:  1. What type of surgery is being performed? Faxed clearance does note state but per previous call on 4/9 - scaling & root plan which involves 2-59m below gum line  2. When is this surgery scheduled? TBD   3. What type of clearance is required (medical clearance vs. Pharmacy clearance to hold med vs. Both)? Both   4. Are there any medications that need to be held prior to surgery and how long? Warfarin  1. - how long will patient need to hold for "deep cleaning" or "scaling" to remove heavy deposits below the gum line  2. - how long will patient need to hold for "full mouth debridement cleaning" which is ultrasonic cleaning to remove moderate deposits above the gum line  3. - how long will patient need to how to dental extractions   5. Practice name and name of physician performing surgery? [facility]   6. What is your office phone and fax number? (p) [phone](f) [phone]  7. Anesthesia type (None, local, MAC, general) ? Local  Also requesting if patient needs pre-meds.   ESheral ApleyM 03/03/2018, 4:32 PM  _________________________________________________________________   (provider comments below)

## 2018-03-05 NOTE — Telephone Encounter (Signed)
   Primary Cardiologist:Kenneth C Hilty, MD  Chart reviewed as part of pre-operative protocol coverage. Because of Kristen Lozano's past medical history and time since last visit, he/she will require a follow-up visit in order to better assess preoperative cardiovascular risk.  Pre-op covering staff: - Please schedule appointment and call patient to inform them. - Please contact requesting surgeon's office via preferred method (i.e, phone, fax) to inform them of need for appointment prior to surgery.   I will also forward this request to pharmacy for guidance on coumadin.  Ledora Bottcher, PA  03/05/2018, 4:36 PM

## 2018-03-06 ENCOUNTER — Ambulatory Visit (INDEPENDENT_AMBULATORY_CARE_PROVIDER_SITE_OTHER): Payer: Medicare HMO | Admitting: *Deleted

## 2018-03-06 DIAGNOSIS — Z7901 Long term (current) use of anticoagulants: Secondary | ICD-10-CM | POA: Diagnosis not present

## 2018-03-06 DIAGNOSIS — I236 Thrombosis of atrium, auricular appendage, and ventricle as current complications following acute myocardial infarction: Secondary | ICD-10-CM | POA: Diagnosis not present

## 2018-03-06 DIAGNOSIS — Z5181 Encounter for therapeutic drug level monitoring: Secondary | ICD-10-CM | POA: Diagnosis not present

## 2018-03-06 LAB — POCT INR: INR: 1

## 2018-03-06 NOTE — Telephone Encounter (Signed)
Left pt a detailed for pt that she will need an appt before she can be cleared and to call the office back to get that scheduled.

## 2018-03-06 NOTE — Patient Instructions (Addendum)
Description   Today and tomorrow take 2 pills then continue taking 1.5 pills everyday.  Recheck in 2 weeks.  C[facility]madin Clinic [phone] Main [phone]    Please call with any questions or to make an appointment at [phone] or [phone]   We do not permit Walk-Ins.

## 2018-03-06 NOTE — Telephone Encounter (Signed)
We do NOT recommend holding warfarin for:  Dental cleaning  Deep cleaning  Debridement cleaning   Single dental extraction  OR  Any minor dental procedure  Any other more invasive procedure like: multiple extractions, root canal, etc will need re-assessment prior to procedure.  Patient has prior hx of CVA/stroke and mural thrombus   Taiya Nutting Rodriguez-Guzman PharmD, BCPS, [place] [address] [place] [address] 03/06/2018 1:17 PM

## 2018-03-09 NOTE — Telephone Encounter (Signed)
Spoke with pt and scheduled her to see Almyra Deforest, PA tomorrow for clearance.  Pt confused about location and said she has never been to [facility] office.  Attempted to look for appt at [facility] office.  Pt asked that I cancel appt with Isaac Laud and she will call back to reschedule.

## 2018-03-10 ENCOUNTER — Ambulatory Visit: Payer: Medicare HMO | Admitting: Physician Assistant

## 2018-03-10 NOTE — Telephone Encounter (Signed)
Pt cancel appt with Almyra Deforest 03/10/2018

## 2018-03-13 ENCOUNTER — Ambulatory Visit (INDEPENDENT_AMBULATORY_CARE_PROVIDER_SITE_OTHER): Payer: Medicare HMO | Admitting: *Deleted

## 2018-03-13 ENCOUNTER — Encounter (INDEPENDENT_AMBULATORY_CARE_PROVIDER_SITE_OTHER): Payer: Self-pay

## 2018-03-13 DIAGNOSIS — Z7901 Long term (current) use of anticoagulants: Secondary | ICD-10-CM | POA: Diagnosis not present

## 2018-03-13 DIAGNOSIS — Z5181 Encounter for therapeutic drug level monitoring: Secondary | ICD-10-CM | POA: Diagnosis not present

## 2018-03-13 DIAGNOSIS — I236 Thrombosis of atrium, auricular appendage, and ventricle as current complications following acute myocardial infarction: Secondary | ICD-10-CM | POA: Diagnosis not present

## 2018-03-13 LAB — POCT INR: INR: 1.3

## 2018-03-13 NOTE — Patient Instructions (Signed)
Description   Today and tomorrow take 2 pills then continue taking 1.5 pills everyday.  Recheck in 1 week  Coumadin Clinic [phone] Main [phone]

## 2018-03-19 NOTE — Telephone Encounter (Addendum)
Received fax from [place] concerning clearance. Called this dental office Janett Billow) and explained that patient will need to be seen for clearance and she cancelled her 03/10/18 appointment with PA. Janett Billow will notify patient of this.

## 2018-03-20 ENCOUNTER — Ambulatory Visit (INDEPENDENT_AMBULATORY_CARE_PROVIDER_SITE_OTHER): Payer: Medicare HMO | Admitting: *Deleted

## 2018-03-20 DIAGNOSIS — Z5181 Encounter for therapeutic drug level monitoring: Secondary | ICD-10-CM

## 2018-03-20 DIAGNOSIS — I236 Thrombosis of atrium, auricular appendage, and ventricle as current complications following acute myocardial infarction: Secondary | ICD-10-CM | POA: Diagnosis not present

## 2018-03-20 DIAGNOSIS — Z8673 Personal history of transient ischemic attack (TIA), and cerebral infarction without residual deficits: Secondary | ICD-10-CM | POA: Diagnosis not present

## 2018-03-20 DIAGNOSIS — Z7901 Long term (current) use of anticoagulants: Secondary | ICD-10-CM

## 2018-03-20 LAB — POCT INR: INR: 2.6

## 2018-03-20 NOTE — Patient Instructions (Signed)
Description   Continue taking 1.5 pills everyday.  Recheck in 2 weeks  [facility] [phone] Main [phone]

## 2018-04-03 ENCOUNTER — Ambulatory Visit (INDEPENDENT_AMBULATORY_CARE_PROVIDER_SITE_OTHER): Payer: Medicare HMO | Admitting: *Deleted

## 2018-04-03 DIAGNOSIS — Z7901 Long term (current) use of anticoagulants: Secondary | ICD-10-CM | POA: Diagnosis not present

## 2018-04-03 DIAGNOSIS — Z5181 Encounter for therapeutic drug level monitoring: Secondary | ICD-10-CM | POA: Diagnosis not present

## 2018-04-03 DIAGNOSIS — I236 Thrombosis of atrium, auricular appendage, and ventricle as current complications following acute myocardial infarction: Secondary | ICD-10-CM | POA: Diagnosis not present

## 2018-04-03 LAB — POCT INR: INR: 3.8

## 2018-04-03 NOTE — Patient Instructions (Signed)
Description   Skip today's dose, the continue taking 1.5 pills everyday.  Recheck in 2 weeks  [facility] [phone] Main [phone]

## 2018-04-17 ENCOUNTER — Ambulatory Visit (INDEPENDENT_AMBULATORY_CARE_PROVIDER_SITE_OTHER): Payer: Medicare HMO | Admitting: Pharmacist

## 2018-04-17 DIAGNOSIS — Z7901 Long term (current) use of anticoagulants: Secondary | ICD-10-CM | POA: Diagnosis not present

## 2018-04-17 DIAGNOSIS — I236 Thrombosis of atrium, auricular appendage, and ventricle as current complications following acute myocardial infarction: Secondary | ICD-10-CM

## 2018-04-17 DIAGNOSIS — Z5181 Encounter for therapeutic drug level monitoring: Secondary | ICD-10-CM

## 2018-04-17 LAB — POCT INR: INR: 3 (ref 2.0–3.0)

## 2018-04-17 NOTE — Patient Instructions (Signed)
Continue taking 1.5 pills everyday.  Recheck in 2 weeks  [facility] [phone] Main [phone]

## 2018-04-30 ENCOUNTER — Other Ambulatory Visit: Payer: Self-pay

## 2018-04-30 ENCOUNTER — Other Ambulatory Visit: Payer: Self-pay | Admitting: Pharmacist Clinician (PhC)/ Clinical Pharmacy Specialist

## 2018-04-30 MED ORDER — LISINOPRIL 10 MG PO TABS: 10.0000 mg | ORAL_TABLET | Freq: Every day | ORAL | 0 refills | Status: DC

## 2018-04-30 MED ORDER — WARFARIN SODIUM 5 MG PO TABS: ORAL_TABLET | ORAL | 1 refills | Status: DC

## 2018-05-01 ENCOUNTER — Ambulatory Visit (INDEPENDENT_AMBULATORY_CARE_PROVIDER_SITE_OTHER): Payer: Medicare HMO | Admitting: *Deleted

## 2018-05-01 DIAGNOSIS — Z5181 Encounter for therapeutic drug level monitoring: Secondary | ICD-10-CM

## 2018-05-01 DIAGNOSIS — Z7901 Long term (current) use of anticoagulants: Secondary | ICD-10-CM

## 2018-05-01 DIAGNOSIS — I236 Thrombosis of atrium, auricular appendage, and ventricle as current complications following acute myocardial infarction: Secondary | ICD-10-CM

## 2018-05-01 LAB — POCT INR: INR: 3.4 — AB (ref 2.0–3.0)

## 2018-05-01 NOTE — Patient Instructions (Signed)
Description   Since you missed your dose yesterday continue taking 1.5 pills everyday and have something dark green today and remain consistent.  Recheck in 2 weeks.  [facility] [phone] Main [phone]

## 2018-05-08 ENCOUNTER — Other Ambulatory Visit: Payer: Self-pay | Admitting: Internal Medicine

## 2018-05-15 ENCOUNTER — Ambulatory Visit (INDEPENDENT_AMBULATORY_CARE_PROVIDER_SITE_OTHER): Payer: Medicare HMO | Admitting: *Deleted

## 2018-05-15 DIAGNOSIS — Z7901 Long term (current) use of anticoagulants: Secondary | ICD-10-CM | POA: Diagnosis not present

## 2018-05-15 DIAGNOSIS — I236 Thrombosis of atrium, auricular appendage, and ventricle as current complications following acute myocardial infarction: Secondary | ICD-10-CM

## 2018-05-15 DIAGNOSIS — Z5181 Encounter for therapeutic drug level monitoring: Secondary | ICD-10-CM

## 2018-05-15 LAB — POCT INR: INR: 3.9 — AB (ref 2.0–3.0)

## 2018-05-15 NOTE — Patient Instructions (Signed)
Description   Do not take any Coumadin today then start taking 1.5 pills everyday except for 1 pill on Tuesdays and have something dark green today and remain consistent.  Recheck in 2 weeks.  [facility] [phone] Main [phone]

## 2018-05-26 ENCOUNTER — Other Ambulatory Visit: Payer: Self-pay | Admitting: Internal Medicine

## 2018-05-29 ENCOUNTER — Other Ambulatory Visit: Payer: Self-pay | Admitting: Internal Medicine

## 2018-05-29 ENCOUNTER — Ambulatory Visit (INDEPENDENT_AMBULATORY_CARE_PROVIDER_SITE_OTHER): Payer: Medicare HMO | Admitting: Pharmacist

## 2018-05-29 DIAGNOSIS — I236 Thrombosis of atrium, auricular appendage, and ventricle as current complications following acute myocardial infarction: Secondary | ICD-10-CM

## 2018-05-29 DIAGNOSIS — Z5181 Encounter for therapeutic drug level monitoring: Secondary | ICD-10-CM

## 2018-05-29 DIAGNOSIS — Z7901 Long term (current) use of anticoagulants: Secondary | ICD-10-CM

## 2018-05-29 LAB — POCT INR: INR: 5.4 — AB (ref 2.0–3.0)

## 2018-05-29 NOTE — Patient Instructions (Signed)
Description   Do not take any Coumadin today or tomorrow then take only 1/2 tablet on Sunday then start taking 1.5 pills everyday except for 1 pill on Tuesdays and Saturdays.  Recheck in 1 week.  [facility] [phone] Main [phone]

## 2018-06-03 DIAGNOSIS — E1142 Type 2 diabetes mellitus with diabetic polyneuropathy: Secondary | ICD-10-CM | POA: Insufficient documentation

## 2018-06-03 DIAGNOSIS — IMO0002 Reserved for concepts with insufficient information to code with codable children: Secondary | ICD-10-CM | POA: Insufficient documentation

## 2018-06-03 DIAGNOSIS — E1165 Type 2 diabetes mellitus with hyperglycemia: Secondary | ICD-10-CM | POA: Insufficient documentation

## 2018-06-05 ENCOUNTER — Ambulatory Visit (INDEPENDENT_AMBULATORY_CARE_PROVIDER_SITE_OTHER): Payer: Medicare HMO | Admitting: *Deleted

## 2018-06-05 DIAGNOSIS — I236 Thrombosis of atrium, auricular appendage, and ventricle as current complications following acute myocardial infarction: Secondary | ICD-10-CM

## 2018-06-05 DIAGNOSIS — Z7901 Long term (current) use of anticoagulants: Secondary | ICD-10-CM

## 2018-06-05 DIAGNOSIS — Z5181 Encounter for therapeutic drug level monitoring: Secondary | ICD-10-CM | POA: Diagnosis not present

## 2018-06-05 LAB — POCT INR: INR: 1.9 — AB (ref 2.0–3.0)

## 2018-06-05 NOTE — Patient Instructions (Signed)
Description   Today take 2 pills, then continue taking 1.5 pills everyday except for 1 pill on Tuesdays and Saturdays.  Recheck in 10 days.   [facility] [phone] Main [phone]

## 2018-06-15 ENCOUNTER — Ambulatory Visit (INDEPENDENT_AMBULATORY_CARE_PROVIDER_SITE_OTHER): Payer: Medicare HMO | Admitting: *Deleted

## 2018-06-15 DIAGNOSIS — Z7901 Long term (current) use of anticoagulants: Secondary | ICD-10-CM | POA: Diagnosis not present

## 2018-06-15 DIAGNOSIS — I236 Thrombosis of atrium, auricular appendage, and ventricle as current complications following acute myocardial infarction: Secondary | ICD-10-CM

## 2018-06-15 DIAGNOSIS — Z5181 Encounter for therapeutic drug level monitoring: Secondary | ICD-10-CM

## 2018-06-15 LAB — POCT INR: INR: 3.9 — AB (ref 2.0–3.0)

## 2018-06-15 NOTE — Patient Instructions (Signed)
Description   Skip today's dose,  then start taking 1.5 pills everyday except for 1 pill on Tuesdays, Thursdays and Saturdays.  Recheck in 2 weeks.  Eat Green vegetables 2 times a week.  [facility] [phone] Main [phone]

## 2018-06-18 ENCOUNTER — Other Ambulatory Visit: Payer: Self-pay | Admitting: Internal Medicine

## 2018-06-18 NOTE — Telephone Encounter (Signed)
Rx request sent to pharmacy.  

## 2018-06-29 ENCOUNTER — Ambulatory Visit (INDEPENDENT_AMBULATORY_CARE_PROVIDER_SITE_OTHER): Payer: Medicare HMO | Admitting: *Deleted

## 2018-06-29 DIAGNOSIS — Z5181 Encounter for therapeutic drug level monitoring: Secondary | ICD-10-CM

## 2018-06-29 DIAGNOSIS — I236 Thrombosis of atrium, auricular appendage, and ventricle as current complications following acute myocardial infarction: Secondary | ICD-10-CM | POA: Diagnosis not present

## 2018-06-29 DIAGNOSIS — Z7901 Long term (current) use of anticoagulants: Secondary | ICD-10-CM | POA: Diagnosis not present

## 2018-06-29 LAB — POCT INR: INR: 3.5 — AB (ref 2.0–3.0)

## 2018-06-29 NOTE — Patient Instructions (Signed)
Description   Skip today's dose,  then start taking 1 pill everyday except 1.5 pills on Mondays, Wednesdays and Fridays.  Recheck in 2 weeks.  Eat Green vegetables 2 times a week.  [facility] [phone] Main [phone]

## 2018-07-13 ENCOUNTER — Ambulatory Visit (INDEPENDENT_AMBULATORY_CARE_PROVIDER_SITE_OTHER): Payer: Medicare HMO | Admitting: *Deleted

## 2018-07-13 DIAGNOSIS — I236 Thrombosis of atrium, auricular appendage, and ventricle as current complications following acute myocardial infarction: Secondary | ICD-10-CM

## 2018-07-13 DIAGNOSIS — Z5181 Encounter for therapeutic drug level monitoring: Secondary | ICD-10-CM | POA: Diagnosis not present

## 2018-07-13 DIAGNOSIS — Z7901 Long term (current) use of anticoagulants: Secondary | ICD-10-CM | POA: Diagnosis not present

## 2018-07-13 LAB — POCT INR: INR: 3.4 — AB (ref 2.0–3.0)

## 2018-07-13 NOTE — Patient Instructions (Signed)
Description   Skip today's dose, then start taking 1 pill everyday except 1.5 pills on Mondays and Fridays.  Recheck in 2 weeks.  Eat Green vegetables 2 times a week.  [facility] [phone] Main [phone]

## 2018-07-21 ENCOUNTER — Other Ambulatory Visit: Payer: Self-pay | Admitting: Internal Medicine

## 2018-08-05 ENCOUNTER — Ambulatory Visit (INDEPENDENT_AMBULATORY_CARE_PROVIDER_SITE_OTHER): Payer: Medicare HMO | Admitting: *Deleted

## 2018-08-05 DIAGNOSIS — I236 Thrombosis of atrium, auricular appendage, and ventricle as current complications following acute myocardial infarction: Secondary | ICD-10-CM

## 2018-08-05 DIAGNOSIS — Z5181 Encounter for therapeutic drug level monitoring: Secondary | ICD-10-CM | POA: Diagnosis not present

## 2018-08-05 DIAGNOSIS — Z7901 Long term (current) use of anticoagulants: Secondary | ICD-10-CM

## 2018-08-05 LAB — POCT INR: INR: 2.2 (ref 2.0–3.0)

## 2018-08-05 NOTE — Patient Instructions (Signed)
Description   Continue  taking 1 pill everyday except 1.5 pills on Mondays and Fridays.  Recheck in 2 weeks.  Eat Green vegetables 2 times a week.  [facility] [phone] Main [phone]

## 2018-08-09 ENCOUNTER — Inpatient Hospital Stay (HOSPITAL_COMMUNITY)
Admission: EM | Admit: 2018-08-09 | Discharge: 2018-08-17 | DRG: 385 | Disposition: A | Discharge status: Home Health | Payer: Medicare HMO | Attending: Internal Medicine | Admitting: Internal Medicine

## 2018-08-09 ENCOUNTER — Encounter (HOSPITAL_COMMUNITY): Payer: Self-pay

## 2018-08-09 ENCOUNTER — Emergency Department (HOSPITAL_COMMUNITY): Payer: Medicare HMO

## 2018-08-09 DIAGNOSIS — Z832 Family history of diseases of the blood and blood-forming organs and certain disorders involving the immune mechanism: Secondary | ICD-10-CM

## 2018-08-09 DIAGNOSIS — I4891 Unspecified atrial fibrillation: Secondary | ICD-10-CM | POA: Diagnosis present

## 2018-08-09 DIAGNOSIS — Z8041 Family history of malignant neoplasm of ovary: Secondary | ICD-10-CM

## 2018-08-09 DIAGNOSIS — M170 Bilateral primary osteoarthritis of knee: Secondary | ICD-10-CM | POA: Diagnosis present

## 2018-08-09 DIAGNOSIS — I5023 Acute on chronic systolic (congestive) heart failure: Secondary | ICD-10-CM

## 2018-08-09 DIAGNOSIS — R918 Other nonspecific abnormal finding of lung field: Secondary | ICD-10-CM

## 2018-08-09 DIAGNOSIS — Z7984 Long term (current) use of oral hypoglycemic drugs: Secondary | ICD-10-CM

## 2018-08-09 DIAGNOSIS — I5042 Chronic combined systolic (congestive) and diastolic (congestive) heart failure: Secondary | ICD-10-CM | POA: Diagnosis present

## 2018-08-09 DIAGNOSIS — R1030 Lower abdominal pain, unspecified: Secondary | ICD-10-CM

## 2018-08-09 DIAGNOSIS — I5043 Acute on chronic combined systolic (congestive) and diastolic (congestive) heart failure: Secondary | ICD-10-CM | POA: Diagnosis present

## 2018-08-09 DIAGNOSIS — Z9114 Patient's other noncompliance with medication regimen: Secondary | ICD-10-CM

## 2018-08-09 DIAGNOSIS — R000 Tachycardia, unspecified: Secondary | ICD-10-CM | POA: Diagnosis present

## 2018-08-09 DIAGNOSIS — Z7901 Long term (current) use of anticoagulants: Secondary | ICD-10-CM

## 2018-08-09 DIAGNOSIS — J90 Pleural effusion, not elsewhere classified: Secondary | ICD-10-CM

## 2018-08-09 DIAGNOSIS — R112 Nausea with vomiting, unspecified: Secondary | ICD-10-CM | POA: Diagnosis present

## 2018-08-09 DIAGNOSIS — R109 Unspecified abdominal pain: Secondary | ICD-10-CM | POA: Diagnosis present

## 2018-08-09 DIAGNOSIS — J690 Pneumonitis due to inhalation of food and vomit: Secondary | ICD-10-CM | POA: Diagnosis present

## 2018-08-09 DIAGNOSIS — I110 Hypertensive heart disease with heart failure: Secondary | ICD-10-CM | POA: Diagnosis present

## 2018-08-09 DIAGNOSIS — I236 Thrombosis of atrium, auricular appendage, and ventricle as current complications following acute myocardial infarction: Secondary | ICD-10-CM | POA: Diagnosis not present

## 2018-08-09 DIAGNOSIS — D573 Sickle-cell trait: Secondary | ICD-10-CM | POA: Diagnosis present

## 2018-08-09 DIAGNOSIS — I10 Essential (primary) hypertension: Secondary | ICD-10-CM | POA: Diagnosis present

## 2018-08-09 DIAGNOSIS — Z681 Body mass index (BMI) 19 or less, adult: Secondary | ICD-10-CM

## 2018-08-09 DIAGNOSIS — Z8249 Family history of ischemic heart disease and other diseases of the circulatory system: Secondary | ICD-10-CM

## 2018-08-09 DIAGNOSIS — I420 Dilated cardiomyopathy: Secondary | ICD-10-CM

## 2018-08-09 DIAGNOSIS — Z7982 Long term (current) use of aspirin: Secondary | ICD-10-CM

## 2018-08-09 DIAGNOSIS — I2510 Atherosclerotic heart disease of native coronary artery without angina pectoris: Secondary | ICD-10-CM | POA: Diagnosis present

## 2018-08-09 DIAGNOSIS — E860 Dehydration: Secondary | ICD-10-CM | POA: Diagnosis present

## 2018-08-09 DIAGNOSIS — E119 Type 2 diabetes mellitus without complications: Secondary | ICD-10-CM | POA: Diagnosis present

## 2018-08-09 DIAGNOSIS — F419 Anxiety disorder, unspecified: Secondary | ICD-10-CM | POA: Diagnosis present

## 2018-08-09 DIAGNOSIS — G8929 Other chronic pain: Secondary | ICD-10-CM | POA: Diagnosis present

## 2018-08-09 DIAGNOSIS — E785 Hyperlipidemia, unspecified: Secondary | ICD-10-CM | POA: Diagnosis present

## 2018-08-09 DIAGNOSIS — E876 Hypokalemia: Secondary | ICD-10-CM | POA: Diagnosis present

## 2018-08-09 DIAGNOSIS — Z800 Family history of malignant neoplasm of digestive organs: Secondary | ICD-10-CM

## 2018-08-09 DIAGNOSIS — Z841 Family history of disorders of kidney and ureter: Secondary | ICD-10-CM

## 2018-08-09 DIAGNOSIS — Z23 Encounter for immunization: Secondary | ICD-10-CM

## 2018-08-09 DIAGNOSIS — K529 Noninfective gastroenteritis and colitis, unspecified: Secondary | ICD-10-CM

## 2018-08-09 DIAGNOSIS — F329 Major depressive disorder, single episode, unspecified: Secondary | ICD-10-CM | POA: Diagnosis present

## 2018-08-09 DIAGNOSIS — I340 Nonrheumatic mitral (valve) insufficiency: Secondary | ICD-10-CM | POA: Diagnosis present

## 2018-08-09 DIAGNOSIS — Z818 Family history of other mental and behavioral disorders: Secondary | ICD-10-CM

## 2018-08-09 DIAGNOSIS — G4700 Insomnia, unspecified: Secondary | ICD-10-CM | POA: Diagnosis present

## 2018-08-09 DIAGNOSIS — K5000 Crohn's disease of small intestine without complications: Principal | ICD-10-CM | POA: Diagnosis present

## 2018-08-09 DIAGNOSIS — E1159 Type 2 diabetes mellitus with other circulatory complications: Secondary | ICD-10-CM | POA: Diagnosis present

## 2018-08-09 DIAGNOSIS — Z8673 Personal history of transient ischemic attack (TIA), and cerebral infarction without residual deficits: Secondary | ICD-10-CM

## 2018-08-09 DIAGNOSIS — Z833 Family history of diabetes mellitus: Secondary | ICD-10-CM

## 2018-08-09 DIAGNOSIS — R110 Nausea: Secondary | ICD-10-CM | POA: Diagnosis present

## 2018-08-09 DIAGNOSIS — Z79899 Other long term (current) drug therapy: Secondary | ICD-10-CM

## 2018-08-09 DIAGNOSIS — E440 Moderate protein-calorie malnutrition: Secondary | ICD-10-CM

## 2018-08-09 LAB — URINALYSIS, ROUTINE W REFLEX MICROSCOPIC
BILIRUBIN URINE: NEGATIVE
Bacteria, UA: NONE SEEN
Glucose, UA: NEGATIVE mg/dL
Ketones, ur: 5 mg/dL — AB
NITRITE: NEGATIVE
PH: 5 (ref 5.0–8.0)
Protein, ur: >=300 mg/dL — AB
Specific Gravity, Urine: 1.023 (ref 1.005–1.030)

## 2018-08-09 LAB — CBC
HEMATOCRIT: 42 % (ref 36.0–46.0)
HEMOGLOBIN: 12.9 g/dL (ref 12.0–15.0)
MCH: 21.6 pg — ABNORMAL LOW (ref 26.0–34.0)
MCHC: 30.7 g/dL (ref 30.0–36.0)
MCV: 70.4 fL — ABNORMAL LOW (ref 78.0–100.0)
Platelets: 291 10*3/uL (ref 150–400)
RBC: 5.97 MIL/uL — AB (ref 3.87–5.11)
RDW: 15 % (ref 11.5–15.5)
WBC: 6.9 10*3/uL (ref 4.0–10.5)

## 2018-08-09 LAB — PROTIME-INR
INR: 2.37
Prothrombin Time: 25.7 seconds — ABNORMAL HIGH (ref 11.4–15.2)

## 2018-08-09 LAB — I-STAT CG4 LACTIC ACID, ED
Lactic Acid, Venous: 2.1 mmol/L (ref 0.5–1.9)
Lactic Acid, Venous: 1.53 mmol/L (ref 0.5–1.9)

## 2018-08-09 LAB — COMPREHENSIVE METABOLIC PANEL
ALT: 32 U/L (ref 0–44)
ANION GAP: 10 (ref 5–15)
AST: 29 U/L (ref 15–41)
Albumin: 3.8 g/dL (ref 3.5–5.0)
Alkaline Phosphatase: 41 U/L (ref 38–126)
BUN: 12 mg/dL (ref 8–23)
CHLORIDE: 107 mmol/L (ref 98–111)
CO2: 22 mmol/L (ref 22–32)
Calcium: 9.1 mg/dL (ref 8.9–10.3)
Creatinine, Ser: 0.82 mg/dL (ref 0.44–1.00)
GFR calc Af Amer: >60 mL/min (ref >60)
GFR calc non Af Amer: >60 mL/min (ref >60)
Glucose, Bld: 183 mg/dL — ABNORMAL HIGH (ref 70–99)
POTASSIUM: 3.8 mmol/L (ref 3.5–5.1)
SODIUM: 139 mmol/L (ref 135–145)
Total Bilirubin: 1.1 mg/dL (ref 0.3–1.2)
Total Protein: 6.9 g/dL (ref 6.5–8.1)

## 2018-08-09 LAB — GLUCOSE, CAPILLARY: GLUCOSE-CAPILLARY: 225 mg/dL — AB (ref 70–99)

## 2018-08-09 LAB — BRAIN NATRIURETIC PEPTIDE: B NATRIURETIC PEPTIDE 5: 1148.3 pg/mL — AB (ref 0.0–100.0)

## 2018-08-09 LAB — TROPONIN I

## 2018-08-09 LAB — CBG MONITORING, ED: Glucose-Capillary: 148 mg/dL — ABNORMAL HIGH (ref 70–99)

## 2018-08-09 LAB — LIPASE, BLOOD: LIPASE: 30 U/L (ref 11–51)

## 2018-08-09 MED ORDER — HYDROMORPHONE HCL 1 MG/ML IJ SOLN
0.5000 mg | Freq: Once | INTRAMUSCULAR | Status: AC
  Administered 2018-08-09: 0.5 mg via INTRAVENOUS
  Filled 2018-08-09: qty 1

## 2018-08-09 MED ORDER — INSULIN ASPART 100 UNIT/ML ~~LOC~~ SOLN
0.0000 [IU] | SUBCUTANEOUS | Status: DC
  Administered 2018-08-09: 3 [IU] via SUBCUTANEOUS
  Administered 2018-08-10 (×2): 2 [IU] via SUBCUTANEOUS
  Administered 2018-08-10 – 2018-08-11 (×2): 1 [IU] via SUBCUTANEOUS
  Administered 2018-08-11: 2 [IU] via SUBCUTANEOUS
  Administered 2018-08-11: 3 [IU] via SUBCUTANEOUS
  Administered 2018-08-11: 1 [IU] via SUBCUTANEOUS
  Administered 2018-08-11: 3 [IU] via SUBCUTANEOUS
  Administered 2018-08-12: 2 [IU] via SUBCUTANEOUS
  Administered 2018-08-12 (×2): 1 [IU] via SUBCUTANEOUS
  Administered 2018-08-12: 2 [IU] via SUBCUTANEOUS
  Administered 2018-08-12: 1 [IU] via SUBCUTANEOUS
  Administered 2018-08-13 (×2): 2 [IU] via SUBCUTANEOUS
  Administered 2018-08-13: 1 [IU] via SUBCUTANEOUS
  Administered 2018-08-13: 5 [IU] via SUBCUTANEOUS
  Administered 2018-08-14: 2 [IU] via SUBCUTANEOUS
  Administered 2018-08-14: 1 [IU] via SUBCUTANEOUS
  Administered 2018-08-14 (×2): 5 [IU] via SUBCUTANEOUS
  Administered 2018-08-15: 7 [IU] via SUBCUTANEOUS
  Administered 2018-08-15: 1 [IU] via SUBCUTANEOUS
  Administered 2018-08-15: 3 [IU] via SUBCUTANEOUS
  Administered 2018-08-15: 2 [IU] via SUBCUTANEOUS
  Administered 2018-08-16 (×2): 3 [IU] via SUBCUTANEOUS
  Administered 2018-08-16: 1 [IU] via SUBCUTANEOUS
  Administered 2018-08-16: 5 [IU] via SUBCUTANEOUS
  Administered 2018-08-16: 3 [IU] via SUBCUTANEOUS
  Administered 2018-08-17: 1 [IU] via SUBCUTANEOUS
  Administered 2018-08-17: 7 [IU] via SUBCUTANEOUS
  Administered 2018-08-17: 3 [IU] via SUBCUTANEOUS

## 2018-08-09 MED ORDER — SODIUM CHLORIDE 0.9 % IV BOLUS: 500.0000 mL | Freq: Once | INTRAVENOUS | Status: DC

## 2018-08-09 MED ORDER — IOHEXOL 300 MG/ML  SOLN
100.0000 mL | Freq: Once | INTRAMUSCULAR | Status: AC | PRN
  Administered 2018-08-09: 100 mL via INTRAVENOUS

## 2018-08-09 MED ORDER — ACETAMINOPHEN 650 MG RE SUPP: 650.0000 mg | Freq: Four times a day (QID) | RECTAL | Status: DC | PRN

## 2018-08-09 MED ORDER — PIPERACILLIN-TAZOBACTAM 3.375 G IVPB 30 MIN
3.3750 g | Freq: Once | INTRAVENOUS | Status: AC
  Administered 2018-08-09: 3.375 g via INTRAVENOUS
  Filled 2018-08-09: qty 50

## 2018-08-09 MED ORDER — ONDANSETRON HCL 4 MG/2ML IJ SOLN
4.0000 mg | Freq: Once | INTRAMUSCULAR | Status: AC
  Administered 2018-08-09: 4 mg via INTRAVENOUS
  Filled 2018-08-09: qty 2

## 2018-08-09 MED ORDER — SODIUM CHLORIDE 0.9 % IV SOLN
1.0000 g | Freq: Once | INTRAVENOUS | Status: AC
  Administered 2018-08-09: 1 g via INTRAVENOUS
  Filled 2018-08-09: qty 10

## 2018-08-09 MED ORDER — PIPERACILLIN-TAZOBACTAM 3.375 G IVPB
3.3750 g | Freq: Three times a day (TID) | INTRAVENOUS | Status: AC
  Administered 2018-08-10 – 2018-08-17 (×21): 3.375 g via INTRAVENOUS
  Filled 2018-08-09 (×22): qty 50

## 2018-08-09 MED ORDER — ONDANSETRON 4 MG PO TBDP
4.0000 mg | ORAL_TABLET | Freq: Once | ORAL | Status: AC
  Administered 2018-08-09: 4 mg via ORAL
  Filled 2018-08-09: qty 1

## 2018-08-09 MED ORDER — ONDANSETRON HCL 4 MG/2ML IJ SOLN
4.0000 mg | Freq: Four times a day (QID) | INTRAMUSCULAR | Status: DC | PRN
  Administered 2018-08-10 – 2018-08-12 (×3): 4 mg via INTRAVENOUS
  Filled 2018-08-09 (×3): qty 2

## 2018-08-09 MED ORDER — ONDANSETRON HCL 4 MG PO TABS: 4.0000 mg | ORAL_TABLET | Freq: Four times a day (QID) | ORAL | Status: DC | PRN

## 2018-08-09 MED ORDER — SODIUM CHLORIDE 0.9 % IV SOLN
500.0000 mg | Freq: Once | INTRAVENOUS | Status: AC
  Administered 2018-08-09: 500 mg via INTRAVENOUS
  Filled 2018-08-09: qty 500

## 2018-08-09 MED ORDER — SODIUM CHLORIDE 0.9 % IV SOLN
INTRAVENOUS | Status: DC
  Administered 2018-08-09: via INTRAVENOUS

## 2018-08-09 MED ORDER — METOPROLOL TARTRATE 5 MG/5ML IV SOLN
5.0000 mg | Freq: Once | INTRAVENOUS | Status: AC
  Administered 2018-08-09: 5 mg via INTRAVENOUS
  Filled 2018-08-09: qty 5

## 2018-08-09 MED ORDER — ACETAMINOPHEN 325 MG PO TABS
650.0000 mg | ORAL_TABLET | Freq: Four times a day (QID) | ORAL | Status: DC | PRN
  Administered 2018-08-12: 650 mg via ORAL
  Filled 2018-08-09: qty 2

## 2018-08-09 NOTE — ED Notes (Signed)
Attempted report but cut short due to floor emergency

## 2018-08-09 NOTE — ED Notes (Signed)
Patient transported to CT 

## 2018-08-09 NOTE — ED Triage Notes (Signed)
Pt presents for evaluation of generalized abd pain with N/V/D x 1 day.

## 2018-08-09 NOTE — Progress Notes (Signed)
Pharmacy Antibiotic Note  Kristen Lozano is a 62 y.o. female admitted on 08/09/2018 with intra-abdominal infection.  Pharmacy has been consulted for zosyn dosing.  WBC WNL, afebrile, LA 1.53. Scr 0.82 (CrCl 55 mL/min). Received 1 times doses of azithromycin and ceftriaxone this evening. CT abdomen showing few small bowel loops with wall thickening possibly due to enteritis.   Plan: Zosyn 3.375g IV q8h (4 hour infusion).  Monitor renal function, cx results, clinical pic  Height: 5\' 3"  (160 cm) Weight: 110 lb (49.9 kg) IBW/kg (Calculated) : 52.4  Temp (24hrs), Avg:97.6 F (36.4 C), Min:97.6 F (36.4 C), Max:97.6 F (36.4 C)  Recent Labs  Lab 08/09/18 1321 08/09/18 1343 08/09/18 1500  WBC 6.9  --   --   CREATININE 0.82  --   --   LATICACIDVEN  --  2.10* 1.53    Estimated Creatinine Clearance: 55.3 mL/min (by C-G formula based on SCr of 0.82 mg/dL).    No Known Allergies  Antimicrobials this admission: Ceftriaxone/azithromycin 9/15 x1 Zosyn 9/15 >>   Dose adjustments this admission: N/A  Microbiology results: 9/15 BCx: sent 9/15 Sputum: sent   Thank you for allowing pharmacy to be a part of this patient's care.  Doylene Canard, PharmD Clinical Pharmacist  Pager: [phone] Phone: [phone] 08/09/2018 9:46 PM

## 2018-08-09 NOTE — ED Notes (Signed)
Pt given ice chips

## 2018-08-09 NOTE — H&P (Addendum)
History and Physical    Kristen Lozano VBT:[id number] DOB: 12/28/1955 DOA: 08/09/2018  PCP: Audley Hose, MD  Patient coming from: Home.  Chief Complaint: Vomiting and abdominal pain.  HPI: Kristen Lozano is a 62 y.o. female with history of stroke and LV thrombus on anticoagulation, hypertension, diabetes mellitus type 2 presents to the ER with persistent vomiting over the last 4 days.  Patient also has been having lower abdominal pain crampy in nature denies any diarrhea last bowel movement was yesterday.  Denies any recent sick contacts, recent travel.  Denies any recent use of antibiotics.  Patient also after started having vomiting started developing productive cough.  Denies any fever chills or shortness of breath.  ED Course: In the ER CT abdomen pelvis done shows left lower quadrant bowel loops showing intermittently versus infectious changes.  Initial lactate was mildly elevated improved without any intervention.  Also shows is possibility of pneumonia.  Patient was started on empiric antibiotics for and admitted for abdominal pain with possible inflammatory versus infectious enteritis.  Review of Systems: As per HPI, rest all negative.   Past Medical History:  Diagnosis Date  . Allergic rhinitis    Requires cetirizine, singulair, and fluticasone.  . Anxiety    Has been on Xanax since 2009. Uses it for stress, anxiety, and insomnia. No contract yet.  . Arthritis   . CHF (congestive heart failure) (HCC)    EF 35% after stroke, presumed ischemic  . Chronic pain    Has OA of knees B. No Xrays in echart. Not requiring narcotics.  . Colitis 12/2017  . Depression   . Diabetes mellitus    Type 2, non insulin dependent. Was dx'd prior to 2008.  Marland Kitchen Hyperglycemia   . Hyperlipemia   . Hypertension   . Sickle cell trait ([place])   . Stroke [facility]) 09/05/14   Dominant left MCA infarcts secondary to unknown embolic source     Past Surgical History:  Procedure Laterality Date  .  TEE WITHOUT CARDIOVERSION N/A 09/06/2014   Procedure: TRANSESOPHAGEAL ECHOCARDIOGRAM (TEE);  Surgeon: Pixie Casino, MD;  Location: [facility] ENDOSCOPY;  Service: Cardiovascular;  Laterality: N/A;     reports that she has never smoked. She has never used smokeless tobacco. She reports that she does not drink alcohol or use drugs.  No Known Allergies  Family History  Problem Relation Age of Onset  . Diabetes Mother   . Hypertension Mother   . Heart disease Father   . Heart attack Father   . Hypertension Father   . Diabetes Father   . Ovarian cancer Sister   . Liver cancer Sister   . Sickle cell anemia Daughter   . Schizophrenia Daughter   . Hypertension Sister   . Hypertension Brother   . Hypertension Daughter   . Kidney disease Sister        x2  . Kidney disease Brother   . Stroke Neg Hx   . Esophageal cancer Neg Hx   . Colon cancer Neg Hx   . Colon polyps Neg Hx     Prior to Admission medications   Medication Sig Start Date End Date Taking? Authorizing Provider  Ascorbic Acid (VITAMIN C GUMMIE PO) Take 250 mg by mouth daily.   Yes [provider]  ASPIRIN LOW DOSE 81 MG EC tablet Take 1 tablet (81 mg total) by mouth daily. Patient taking differently: Take 81 mg by mouth daily.  01/22/18  Yes Hilty, Nadean Corwin,  MD  atorvastatin (LIPITOR) 20 MG tablet Take 1 tablet (20 mg total) by mouth daily at 6 PM. Please schedule appointment for refills. 01/02/18  Yes Hilty, Nadean Corwin, MD  b complex vitamins tablet Take 1 tablet by mouth daily.   Yes [provider]  carvedilol (COREG) 25 MG tablet Take 1 tablet (25 mg total) by mouth 2 (two) times daily with a meal. Please schedule appointment for refills. 01/02/18  Yes Hilty, Nadean Corwin, MD  Cholecalciferol (VITAMIN D3 ADULT GUMMIES PO) Take 2,000 Units by mouth daily.   Yes [provider]  Cyanocobalamin (VITAMIN B-12) 5000 MCG SUBL Place 5,000 mcg under the tongue daily.   Yes [provider]  Dulaglutide  (TRULICITY) 4.82 NO/0.3BC SOPN Inject 0.75 mg into the skin every Saturday.   Yes [provider]  lisinopril (PRINIVIL,ZESTRIL) 10 MG tablet Take 1 tablet (10 mg total) by mouth daily. Please schedule appointment for refills. 07/21/18  Yes Hilty, Nadean Corwin, MD  metFORMIN (GLUCOPHAGE-XR) 500 MG 24 hr tablet Take 500 mg by mouth 2 (two) times daily. 08/06/18  Yes [provider]  Vitamin E 400 units CHEW Chew 400 Units by mouth daily.   Yes [provider]  warfarin (COUMADIN) 5 MG tablet Tale 1.5 Tablets (7.3m)daily OR as directed by coumadin clinic Patient taking differently: Take 5-7.5 mg by mouth See admin instructions. Take 1 1/2 tablets (7.5 mg) on Monday and Friday evening, take 1 tablet (5 mg) on Sunday, Tuesday, Wednesday, Thursday, Saturday evening or as directed by coumadin clinic 05/29/18  Yes Hilty, KNadean Corwin MD  metFORMIN (GLUCOPHAGE) 1000 MG tablet Take 1 tablet (1,000 mg total) by mouth 2 (two) times daily with a meal. Patient not taking: Reported on 08/09/2018 11/01/16   CDessa Phi DO  ondansetron ([facility] 4 MG/5ML solution Take 5 mLs (4 mg total) by mouth every 8 (eight) hours as needed for nausea or vomiting. Patient not taking: Reported on 08/09/2018 01/15/18   JDomenic Polite MD  promethazine (PHENERGAN) 12.5 MG tablet Take 1 tablet (12.5 mg total) by mouth every 6 (six) hours as needed for nausea or vomiting. Patient not taking: Reported on 08/09/2018 01/09/18   FJacqlyn Larsen PA-C  sitaGLIPtin (JANUVIA) 100 MG tablet Take 1 tablet (100 mg total) by mouth daily. Patient not taking: Reported on 08/09/2018 11/01/16   CDessa Phi DO    Physical Exam: Vitals:   08/09/18 2000 08/09/18 2030 08/09/18 2100 08/09/18 2130  BP: 125/88 102/81 (!) 123/91 (!) 123/93  Pulse: (!) 129 (!) 120 (!) 125 (!) 123  Resp: (!) 27 16 (!) 22 (!) 22  Temp:      TempSrc:      SpO2: 96% 93% 97% 94%      Constitutional: Moderately built and nourished. Vitals:    08/09/18 2000 08/09/18 2030 08/09/18 2100 08/09/18 2130  BP: 125/88 102/81 (!) 123/91 (!) 123/93  Pulse: (!) 129 (!) 120 (!) 125 (!) 123  Resp: (!) 27 16 (!) 22 (!) 22  Temp:      TempSrc:      SpO2: 96% 93% 97% 94%   Eyes: Anicteric no pallor. ENMT: No discharge from the ears eyes nose or mouth Neck: No mass felt.  No neck rigidity. Respiratory: No rhonchi or crepitation. Cardiovascular: S1-S2 heard no murmurs appreciated. Abdomen: Soft nontender bowel sounds present. Musculoskeletal: No edema. Skin: No rash. Neurologic: Alert awake oriented to time place and person.  Moves all extremities. Psychiatric: Appears normal.  Normal affect.  Labs on Admission: I have personally reviewed following labs and imaging studies  CBC: Recent Labs  Lab 08/09/18 1321  WBC 6.9  HGB 12.9  HCT 42.0  MCV 70.4*  PLT 619   Basic Metabolic Panel: Recent Labs  Lab 08/09/18 1321  NA 139  K 3.8  CL 107  CO2 22  GLUCOSE 183*  BUN 12  CREATININE 0.82  CALCIUM 9.1   GFR: CrCl cannot be calculated (Unknown ideal weight.). Liver Function Tests: Recent Labs  Lab 08/09/18 1321  AST 29  ALT 32  ALKPHOS 41  BILITOT 1.1  PROT 6.9  ALBUMIN 3.8   Recent Labs  Lab 08/09/18 1321  LIPASE 30   No results for input(s): AMMONIA in the last 168 hours. Coagulation Profile: Recent Labs  Lab 08/05/18 1133 08/09/18 1643  INR 2.2 2.37   Cardiac Enzymes: Recent Labs  Lab 08/09/18 1643  TROPONINI <0.03   BNP (last 3 results) No results for input(s): PROBNP in the last 8760 hours. HbA1C: No results for input(s): HGBA1C in the last 72 hours. CBG: Recent Labs  Lab 08/09/18 1303  GLUCAP 148*   Lipid Profile: No results for input(s): CHOL, HDL, LDLCALC, TRIG, CHOLHDL, LDLDIRECT in the last 72 hours. Thyroid Function Tests: No results for input(s): TSH, T4TOTAL, FREET4, T3FREE, THYROIDAB in the last 72 hours. Anemia Panel: No results for input(s): VITAMINB12, FOLATE, FERRITIN,  TIBC, IRON, RETICCTPCT in the last 72 hours. Urine analysis:    Component Value Date/Time   COLORURINE YELLOW 08/09/2018 1328   APPEARANCEUR HAZY (A) 08/09/2018 1328   LABSPEC 1.023 08/09/2018 1328   PHURINE 5.0 08/09/2018 1328   GLUCOSEU NEGATIVE 08/09/2018 1328   HGBUR SMALL (A) 08/09/2018 1328   BILIRUBINUR NEGATIVE 08/09/2018 1328   KETONESUR 5 (A) 08/09/2018 1328   PROTEINUR >=300 (A) 08/09/2018 1328   UROBILINOGEN 1.0 09/07/2014 1405   NITRITE NEGATIVE 08/09/2018 1328   LEUKOCYTESUR TRACE (A) 08/09/2018 1328   Sepsis Labs: @LABRCNTIP (procalcitonin:4,lacticidven:4) )No results found for this or any previous visit (from the past 240 hour(s)).   Radiological Exams on Admission: Dg Chest 2 View  Result Date: 08/09/2018 CLINICAL DATA:  Generalized abdominal pain. Nausea, vomiting and diarrhea. EXAM: CHEST - 2 VIEW COMPARISON:  Chest x-ray dated 09/04/2014. FINDINGS: Cardiomegaly, partially obscured by dense bibasilar opacities. Upper lungs are relatively clear. Osseous structures about the chest are unremarkable. IMPRESSION: 1. Dense bibasilar opacities, suspicious for bilateral pneumonia, less likely asymmetric pulmonary edema. 2. Probable bilateral pleural effusions, small to moderate in size. 3. Probable cardiomegaly, difficult to definitively characterize due to obscuration by the bibasilar opacities. Electronically Signed   By: Franki Cabot M.D.   On: 08/09/2018 14:11   Ct Abdomen Pelvis W Contrast  Result Date: 08/09/2018 CLINICAL DATA:  Abdominal pain with nausea, vomiting and diarrhea 3 days. EXAM: CT ABDOMEN AND PELVIS WITH CONTRAST TECHNIQUE: Multidetector CT imaging of the abdomen and pelvis was performed using the standard protocol following bolus administration of intravenous contrast. CONTRAST:  15m OMNIPAQUE IOHEXOL 300 MG/ML  SOLN COMPARISON:  01/09/2018 and 09/07/2014 FINDINGS: Lower chest: Examination demonstrates moderate size bilateral pleural effusions with  associated compressive atelectasis in the lung bases. Mild cardiomegaly. Hepatobiliary: Mild diffuse low-attenuation of the liver without focal mass. Gallbladder and biliary tree are normal. Pancreas: Normal. Spleen: Normal. Adrenals/Urinary Tract: Adrenal glands are normal. Kidneys are normal in size with patchy bilateral renal cortical scarring. Few small subcentimeter bilateral cortical hypodensities too small to characterize but likely cysts. No hydronephrosis or nephrolithiasis. Bladder  and ureters are normal. Stomach/Bowel: Stomach is within normal. Mild wall thickening of several small bowel loops over the left lower quadrant which may be due to regional enteritis of infectious or inflammatory nature. No bowel obstruction. Appendix is normal. Mild decompression of the transverse and descending colon as the colon is otherwise unremarkable. Vascular/Lymphatic: Normal. Reproductive: Normal. Other: No free fluid or focal inflammatory change. Musculoskeletal: Unremarkable. IMPRESSION: Few small bowel loops over the left lower quadrant with wall thickening which may be due to a regional enteritis of infectious or inflammatory nature. No bowel obstruction. Moderate size bilateral pleural effusions with bibasilar atelectasis. Bilateral renal cortical scarring. Couple small subcentimeter bilateral renal cortical hypodensities too small to characterize but likely cysts. Attic steatosis. Mild cardiomegaly. Electronically Signed   By: Marin Olp M.D.   On: 08/09/2018 16:46    EKG: Independently reviewed.  Sinus tachycardia with nonspecific ST changes.  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 Nausea vomiting abdominal pain  with CAT scan showing bowel loops in the left lower quadrant could be having infectious versus inflammatory changes.  For now we will keep patient n.p.o. and repeat lactate levels.  Will keep patient on empiric antibiotics.  If pain and vomiting persist may consider consulting GI in the morning. 2. Pneumonia -likely aspiration.  Patient received ceftriaxone and Zithromax in the ER.  For now patient is on Zosyn.  Will check urine for Legionella and strep antigen to rule out any atypical pneumonia. 3. Chronic systolic and diastolic heart failure last EF measured in 2017 was 40 to 45% -BNP is elevated at counseling but patient is not hypoxic.  Closely observe. 4. Sinus tachycardia with history of hypertension and patient's husband state patient also has history of A. Fib -has not been able to take her medications including Coreg last few days since patient was vomiting.  Will keep patient on scheduled dose of IV metoprolol closely observe. 5. Diabetes mellitus type 2 we will keep patient on sliding scale coverage. 6. History of LV thrombus and patient's husband states patient also has history of A. fib and history of embolic stroke on Coumadin -patient has persistent vomiting and abdominal pain and unreliable take oral Coumadin will keep patient on IV heparin.   DVT prophylaxis: IV heparin infusion. Code Status: Full code. Family Communication: Patient's husband. Disposition Plan: Home. Consults called: None. Admission status: Observation.   Rise Patience MD [facility] Pager [phone].  If 7PM-7AM, please contact night-coverage [email or web address] Password [facility]  08/09/2018, 9:41 PM

## 2018-08-09 NOTE — ED Provider Notes (Signed)
[facility] Provider Note   CSN: [id number] Arrival date & time: 08/09/18  1249     History   Chief Complaint Chief Complaint  Patient presents with  . Abdominal Pain    HPI Kristen Lozano is a 62 y.o. female.  Patient c/o mid to lower abd pain and recurrent nausea and vomiting in past 1-2 days. Symptoms persistent, constant, moderate-severe, non radiating. Denies hx same. Last bm 2 days ago. No dysuria or gu c/o. No back or flank pain. No fever or chills. Denies chest pain or discomfort. No sob. +non prod cough. No known ill contacts or bad food ingestion.   The history is provided by the patient.  Abdominal Pain   Associated symptoms include vomiting. Pertinent negatives include fever, diarrhea and headaches.    Past Medical History:  Diagnosis Date  . Allergic rhinitis    Requires cetirizine, singulair, and fluticasone.  . Anxiety    Has been on Xanax since 2009. Uses it for stress, anxiety, and insomnia. No contract yet.  . Arthritis   . CHF (congestive heart failure) (HCC)    EF 35% after stroke, presumed ischemic  . Chronic pain    Has OA of knees B. No Xrays in echart. Not requiring narcotics.  . Colitis 12/2017  . Depression   . Diabetes mellitus    Type 2, non insulin dependent. Was dx'd prior to 2008.  Marland Kitchen Hyperglycemia   . Hyperlipemia   . Hypertension   . Sickle cell trait ([place])   . Stroke [facility]) 09/05/14   Dominant left MCA infarcts secondary to unknown embolic source     Patient Active Problem List   Diagnosis Date Noted  . Infectious colitis 01/11/2018  . Hypokalemia 01/11/2018  . Uncontrolled type 2 diabetes mellitus with hyperglycemia ([place])   . Chronic combined systolic and diastolic CHF (congestive heart failure) ([place]) 12/16/2016  . Monitoring for long-term anticoagulant use 11/14/2016  . DKA (diabetic ketoacidoses) ([place]) 11/01/2016  . Diabetes mellitus 11/01/2016  . Elevated troponin   . Cardiomyopathy,  ischemic   . LV (left ventricular) mural thrombus following MI ([place])   . Cognitive deficit due to recent stroke 10/18/2014  . History of completed stroke 10/18/2014  . Abnormal stress test 10/17/2014  . Dysphagia, pharyngoesophageal phase 09/23/2014  . Abnormal CT scan 09/23/2014  . Dilated bile duct 09/23/2014  . Urinary retention 09/07/2014  . 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 Stroke ([place]) 09/05/2014  . CVA (cerebral infarction) 09/05/2014  . Non compliance w medication regimen 08/10/2012  . Fatigue 02/18/2012  . Essential hypertension 03/22/2011  . Healthcare maintenance 03/22/2011  . Chronic pain   . ALLERGIC RHINITIS, SEASONAL 03/14/2008  . SICKLE CELL TRAIT 10/31/2006  . Anxiety state 10/31/2006  . DEPRESSION 10/31/2006    Past Surgical History:  Procedure Laterality Date  . TEE WITHOUT CARDIOVERSION N/A 09/06/2014   Procedure: TRANSESOPHAGEAL ECHOCARDIOGRAM (TEE);  Surgeon: Pixie Casino, MD;  Location: [facility] ENDOSCOPY;  Service: Cardiovascular;  Laterality: N/A;     OB History   None      Home Medications    Prior to Admission medications   Medication Sig Start Date End Date Taking? Authorizing Provider  ASPIRIN LOW DOSE 81 MG EC tablet Take 1 tablet (81 mg total) by mouth daily. Patient taking differently: Take 81 mg by mouth daily.  01/22/18  Yes Hilty, Nadean Corwin, MD  atorvastatin (LIPITOR) 20 MG tablet Take 1 tablet (20 mg total) by mouth daily  at 6 PM. Please schedule appointment for refills. 01/02/18  Yes Hilty, Nadean Corwin, MD  carvedilol (COREG) 25 MG tablet Take 1 tablet (25 mg total) by mouth 2 (two) times daily with a meal. Please schedule appointment for refills. 01/02/18  Yes Hilty, Nadean Corwin, MD  lisinopril (PRINIVIL,ZESTRIL) 10 MG tablet Take 1 tablet (10 mg total) by mouth daily. Please schedule appointment for refills. 07/21/18  Yes Hilty, Nadean Corwin, MD  metFORMIN (GLUCOPHAGE) 1000 MG tablet Take 1 tablet (1,000 mg total) by mouth 2 (two) times  daily with a meal. 11/01/16  Yes Dessa Phi, DO  TRULICITY 5.94 VO/5.9YT SOPN Inject 0.75 mg as directed once a week. Tuesday 12/31/17  Yes [provider]  warfarin (COUMADIN) 5 MG tablet Tale 1.5 Tablets (7.69m)daily OR as directed by coumadin clinic 05/29/18  Yes Hilty, KNadean Corwin MD  ondansetron ([facility] 4 MG/5ML solution Take 5 mLs (4 mg total) by mouth every 8 (eight) hours as needed for nausea or vomiting. Patient not taking: Reported on 08/09/2018 01/15/18   JDomenic Polite MD  promethazine (PHENERGAN) 12.5 MG tablet Take 1 tablet (12.5 mg total) by mouth every 6 (six) hours as needed for nausea or vomiting. Patient not taking: Reported on 08/09/2018 01/09/18   FJacqlyn Larsen PA-C  sitaGLIPtin (JANUVIA) 100 MG tablet Take 1 tablet (100 mg total) by mouth daily. Patient not taking: Reported on 08/09/2018 11/01/16   CDessa Phi DO  TRADJENTA 5 MG TABS tablet Take 5 mg by mouth daily. 11/01/16   [provider]    Family History Family History  Problem Relation Age of Onset  . Diabetes Mother   . Hypertension Mother   . Heart disease Father   . Heart attack Father   . Hypertension Father   . Diabetes Father   . Ovarian cancer Sister   . Liver cancer Sister   . Sickle cell anemia Daughter   . Schizophrenia Daughter   . Hypertension Sister   . Hypertension Brother   . Hypertension Daughter   . Kidney disease Sister        x2  . Kidney disease Brother   . Stroke Neg Hx   . Esophageal cancer Neg Hx   . Colon cancer Neg Hx   . Colon polyps Neg Hx     Social History Social History   Tobacco Use  . Smoking status: Never Smoker  . Smokeless tobacco: Never Used  Substance Use Topics  . Alcohol use: No    Alcohol/week: 0.0 standard drinks  . Drug use: No     Allergies   Patient has no known allergies.   Review of Systems Review of Systems  Constitutional: Negative for fever.  HENT: Negative for sore throat.   Eyes: Negative for redness.    Respiratory: Negative for shortness of breath.   Cardiovascular: Negative for chest pain.  Gastrointestinal: Positive for abdominal pain and vomiting. Negative for diarrhea.  Endocrine: Negative for polyuria.  Genitourinary: Negative for flank pain.  Musculoskeletal: Negative for back pain and neck pain.  Skin: Negative for rash.  Neurological: Negative for headaches.  Hematological: Does not bruise/bleed easily.  Psychiatric/Behavioral: Negative for confusion.     Physical Exam Updated Vital Signs BP (!) 151/103 (BP Location: Left Arm)   Pulse (!) 131   Temp 97.6 F (36.4 C) (Oral)   Resp 20   SpO2 98%   Physical Exam  Constitutional: She appears well-developed and well-nourished.  Vomiting yellowish material. Tachycardic.   HENT:  Mouth/Throat: Oropharynx is clear and moist.  Eyes: Pupils are equal, round, and reactive to light. Conjunctivae are normal. No scleral icterus.  Neck: Neck supple. No tracheal deviation present.  Cardiovascular: Regular rhythm, normal heart sounds and intact distal pulses. Exam reveals no gallop and no friction rub.  No murmur heard. Pulmonary/Chest: Effort normal and breath sounds normal. No respiratory distress.  Abdominal: Soft. Normal appearance. She exhibits no distension and no mass. There is tenderness. There is no rebound and no guarding. No hernia.  Mid to lower abd tenderness. No peritonitis.   Genitourinary:  Genitourinary Comments: No cva tenderness  Musculoskeletal: She exhibits no edema or tenderness.  Neurological: She is alert.  Skin: Skin is warm and dry. No rash noted.  Psychiatric: She has a normal mood and affect.  Nursing note and vitals reviewed.    ED Treatments / Results  Labs (all labs ordered are listed, but only abnormal results are displayed) Results for orders placed or performed during the hospital encounter of 08/09/18  Lipase, blood  Result Value Ref Range   Lipase 30 11 - 51 U/L  Comprehensive  metabolic panel  Result Value Ref Range   Sodium 139 135 - 145 mmol/L   Potassium 3.8 3.5 - 5.1 mmol/L   Chloride 107 98 - 111 mmol/L   CO2 22 22 - 32 mmol/L   Glucose, Bld 183 (H) 70 - 99 mg/dL   BUN 12 8 - 23 mg/dL   Creatinine, Ser 0.82 0.44 - 1.00 mg/dL   Calcium 9.1 8.9 - 10.3 mg/dL   Total Protein 6.9 6.5 - 8.1 g/dL   Albumin 3.8 3.5 - 5.0 g/dL   AST 29 15 - 41 U/L   ALT 32 0 - 44 U/L   Alkaline Phosphatase 41 38 - 126 U/L   Total Bilirubin 1.1 0.3 - 1.2 mg/dL   GFR calc non Af Amer >60 >60 mL/min   GFR calc Af Amer >60 >60 mL/min   Anion gap 10 5 - 15  CBC  Result Value Ref Range   WBC 6.9 4.0 - 10.5 K/uL   RBC 5.97 (H) 3.87 - 5.11 MIL/uL   Hemoglobin 12.9 12.0 - 15.0 g/dL   HCT 42.0 36.0 - 46.0 %   MCV 70.4 (L) 78.0 - 100.0 fL   MCH 21.6 (L) 26.0 - 34.0 pg   MCHC 30.7 30.0 - 36.0 g/dL   RDW 15.0 11.5 - 15.5 %   Platelets 291 150 - 400 K/uL  Urinalysis, Routine w reflex microscopic  Result Value Ref Range   Color, Urine YELLOW YELLOW   APPearance HAZY (A) CLEAR   Specific Gravity, Urine 1.023 1.005 - 1.030   pH 5.0 5.0 - 8.0   Glucose, UA NEGATIVE NEGATIVE mg/dL   Hgb urine dipstick SMALL (A) NEGATIVE   Bilirubin Urine NEGATIVE NEGATIVE   Ketones, ur 5 (A) NEGATIVE mg/dL   Protein, ur >=300 (A) NEGATIVE mg/dL   Nitrite NEGATIVE NEGATIVE   Leukocytes, UA TRACE (A) NEGATIVE   RBC / HPF 0-5 0 - 5 RBC/hpf   WBC, UA 0-5 0 - 5 WBC/hpf   Bacteria, UA NONE SEEN NONE SEEN   Squamous Epithelial / LPF 0-5 0 - 5   Mucus PRESENT   Brain natriuretic peptide  Result Value Ref Range   B Natriuretic Peptide 1,148.3 (H) 0.0 - 100.0 pg/mL  Protime-INR  Result Value Ref Range   Prothrombin Time 25.7 (H) 11.4 - 15.2 seconds   INR 2.37  Troponin I  Result Value Ref Range   Troponin I <0.03 <0.03 ng/mL  I-Stat CG4 Lactic Acid, ED  Result Value Ref Range   Lactic Acid, Venous 2.10 (HH) 0.5 - 1.9 mmol/L   Comment NOTIFIED PHYSICIAN   CBG monitoring, ED  Result Value Ref  Range   Glucose-Capillary 148 (H) 70 - 99 mg/dL  I-Stat CG4 Lactic Acid, ED  Result Value Ref Range   Lactic Acid, Venous 1.53 0.5 - 1.9 mmol/L   Dg Chest 2 View  Result Date: 08/09/2018 CLINICAL DATA:  Generalized abdominal pain. Nausea, vomiting and diarrhea. EXAM: CHEST - 2 VIEW COMPARISON:  Chest x-ray dated 09/04/2014. FINDINGS: Cardiomegaly, partially obscured by dense bibasilar opacities. Upper lungs are relatively clear. Osseous structures about the chest are unremarkable. IMPRESSION: 1. Dense bibasilar opacities, suspicious for bilateral pneumonia, less likely asymmetric pulmonary edema. 2. Probable bilateral pleural effusions, small to moderate in size. 3. Probable cardiomegaly, difficult to definitively characterize due to obscuration by the bibasilar opacities. Electronically Signed   By: Franki Cabot M.D.   On: 08/09/2018 14:11    EKG EKG Interpretation  Date/Time:  Sunday August 09 2018 13:10:38 EDT Ventricular Rate:  134 PR Interval:    QRS Duration: 80 QT Interval:  374 QTC Calculation: 558 R Axis:   -13 Text Interpretation:  Sinus tachycardia with occasional Premature ventricular complexes Non-specific ST-t changes Confirmed by Lajean Saver [id number]) on 08/09/2018 3:28:38 PM   Radiology Dg Chest 2 View  Result Date: 08/09/2018 CLINICAL DATA:  Generalized abdominal pain. Nausea, vomiting and diarrhea. EXAM: CHEST - 2 VIEW COMPARISON:  Chest x-ray dated 09/04/2014. FINDINGS: Cardiomegaly, partially obscured by dense bibasilar opacities. Upper lungs are relatively clear. Osseous structures about the chest are unremarkable. IMPRESSION: 1. Dense bibasilar opacities, suspicious for bilateral pneumonia, less likely asymmetric pulmonary edema. 2. Probable bilateral pleural effusions, small to moderate in size. 3. Probable cardiomegaly, difficult to definitively characterize due to obscuration by the bibasilar opacities. Electronically Signed   By: Franki Cabot M.D.   On:  08/09/2018 14:11   Ct Abdomen Pelvis W Contrast  Result Date: 08/09/2018 CLINICAL DATA:  Abdominal pain with nausea, vomiting and diarrhea 3 days. EXAM: CT ABDOMEN AND PELVIS WITH CONTRAST TECHNIQUE: Multidetector CT imaging of the abdomen and pelvis was performed using the standard protocol following bolus administration of intravenous contrast. CONTRAST:  133m OMNIPAQUE IOHEXOL 300 MG/ML  SOLN COMPARISON:  01/09/2018 and 09/07/2014 FINDINGS: Lower chest: Examination demonstrates moderate size bilateral pleural effusions with associated compressive atelectasis in the lung bases. Mild cardiomegaly. Hepatobiliary: Mild diffuse low-attenuation of the liver without focal mass. Gallbladder and biliary tree are normal. Pancreas: Normal. Spleen: Normal. Adrenals/Urinary Tract: Adrenal glands are normal. Kidneys are normal in size with patchy bilateral renal cortical scarring. Few small subcentimeter bilateral cortical hypodensities too small to characterize but likely cysts. No hydronephrosis or nephrolithiasis. Bladder and ureters are normal. Stomach/Bowel: Stomach is within normal. Mild wall thickening of several small bowel loops over the left lower quadrant which may be due to regional enteritis of infectious or inflammatory nature. No bowel obstruction. Appendix is normal. Mild decompression of the transverse and descending colon as the colon is otherwise unremarkable. Vascular/Lymphatic: Normal. Reproductive: Normal. Other: No free fluid or focal inflammatory change. Musculoskeletal: Unremarkable. IMPRESSION: Few small bowel loops over the left lower quadrant with wall thickening which may be due to a regional enteritis of infectious or inflammatory nature. No bowel obstruction. Moderate size bilateral pleural effusions with bibasilar atelectasis. Bilateral renal cortical scarring.  Couple small subcentimeter bilateral renal cortical hypodensities too small to characterize but likely cysts. Attic steatosis. Mild  cardiomegaly. Electronically Signed   By: Marin Olp M.D.   On: 08/09/2018 16:46    Procedures Procedures (including critical care time)  Medications Ordered in ED Medications  ondansetron (ZOFRAN-ODT) disintegrating tablet 4 mg (4 mg Oral Not Given 08/09/18 1525)  HYDROmorphone (DILAUDID) injection 0.5 mg (has no administration in time range)  ondansetron (ZOFRAN) injection 4 mg (has no administration in time range)     Initial Impression / Assessment and Plan / ED Course  I have reviewed the triage vital signs and the nursing notes.  Pertinent labs & imaging results that were available during my care of the patient were reviewed by me and considered in my medical decision making (see chart for details).  Iv ns bolus. zofran iv. Labs. Ecg. Imaging.  Reviewed nursing notes and prior charts for additional history.   cxr reviewed - infiltrate ?pna. Antibiotics given.   Labs reviewed - wbc normal.   Additional labs pending.   CT reviewed c/w enteritis, ?infectious.   Pt with recurrent episodes of nausea/vomiting. No peritonitis on abd exam. Additional fluids and nausea med.  Given persistent symptoms, tachycardia - will admit. Unassigned medicine consulted for admission.    Final Clinical Impressions(s) / ED Diagnoses   Final diagnoses:  None    ED Discharge Orders    None       Lajean Saver, MD 08/09/18 2043

## 2018-08-09 NOTE — Progress Notes (Signed)
ANTICOAGULATION CONSULT NOTE - Initial Consult  Pharmacy Consult for heparin Indication: hx LV thrombus/CVA  No Known Allergies  Patient Measurements: Height: 5\' 3"  (160 cm) Weight: 110 lb (49.9 kg) IBW/kg (Calculated) : 52.4 Heparin Dosing Weight: 49.9 kg   Vital Signs: Temp: 97.6 F (36.4 C) (09/15 1259) Temp Source: Oral (09/15 1259) BP: 123/93 (09/15 2130) Pulse Rate: 121 (09/15 2145)  Labs: Recent Labs    08/09/18 1321 08/09/18 1643  HGB 12.9  --   HCT 42.0  --   PLT 291  --   LABPROT  --  25.7*  INR  --  2.37  CREATININE 0.82  --   TROPONINI  --  <0.03    Estimated Creatinine Clearance: 55.3 mL/min (by C-G formula based on SCr of 0.82 mg/dL).   Medical History: Past Medical History:  Diagnosis Date  . Allergic rhinitis    Requires cetirizine, singulair, and fluticasone.  . Anxiety    Has been on Xanax since 2009. Uses it for stress, anxiety, and insomnia. No contract yet.  . Arthritis   . CHF (congestive heart failure) (HCC)    EF 35% after stroke, presumed ischemic  . Chronic pain    Has OA of knees B. No Xrays in echart. Not requiring narcotics.  . Colitis 12/2017  . Depression   . Diabetes mellitus    Type 2, non insulin dependent. Was dx'd prior to 2008.  Marland Kitchen Hyperglycemia   . Hyperlipemia   . Hypertension   . Sickle cell trait ([place])   . 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 insulin aspart  0-9 Units Subcutaneous Q4H  . metoprolol tartrate  5 mg Intravenous Once    Assessment: 35 yom presenting with mid-lower abdominal pain and nausea/vomiting x1-2 days. On warfarin PTA for hx of LV thrombus/CVA. Home regimen is 5 mg daily except 7.5 mg on Monday/Friday.   INR on admission today is 2.37. Last dose was 9/13. Hgb 12.9, plt 291. No s/sx of bleeding. Of note, did receive azithromycin today which can impact INR sensitivity.   Plan to start heparin infusion once  INR<2.   Goal of Therapy:  INR 2-3 Monitor platelets by anticoagulation protocol: Yes   Plan:  Continue to hold warfarin Monitor daily INR- start heparin infusion when INR<2 Monitor CBC and for s/sx of bleeding   Doylene Canard, PharmD Clinical Pharmacist  Pager: [phone] Phone: [phone] 08/09/2018,9:54 PM

## 2018-08-10 ENCOUNTER — Other Ambulatory Visit: Payer: Self-pay

## 2018-08-10 ENCOUNTER — Observation Stay (HOSPITAL_BASED_OUTPATIENT_CLINIC_OR_DEPARTMENT_OTHER): Payer: Medicare HMO

## 2018-08-10 ENCOUNTER — Observation Stay (HOSPITAL_COMMUNITY): Payer: Medicare HMO

## 2018-08-10 DIAGNOSIS — I340 Nonrheumatic mitral (valve) insufficiency: Secondary | ICD-10-CM

## 2018-08-10 DIAGNOSIS — I236 Thrombosis of atrium, auricular appendage, and ventricle as current complications following acute myocardial infarction: Secondary | ICD-10-CM | POA: Diagnosis not present

## 2018-08-10 DIAGNOSIS — R918 Other nonspecific abnormal finding of lung field: Secondary | ICD-10-CM | POA: Diagnosis not present

## 2018-08-10 DIAGNOSIS — I10 Essential (primary) hypertension: Secondary | ICD-10-CM | POA: Diagnosis not present

## 2018-08-10 DIAGNOSIS — K529 Noninfective gastroenteritis and colitis, unspecified: Secondary | ICD-10-CM | POA: Diagnosis not present

## 2018-08-10 LAB — PROTIME-INR
INR: 2.3
Prothrombin Time: 25.1 seconds — ABNORMAL HIGH (ref 11.4–15.2)

## 2018-08-10 LAB — LACTIC ACID, PLASMA
Lactic Acid, Venous: 2 mmol/L (ref 0.5–1.9)
Lactic Acid, Venous: 2.1 mmol/L (ref 0.5–1.9)
Lactic Acid, Venous: 2 mmol/L (ref 0.5–1.9)

## 2018-08-10 LAB — GLUCOSE, CAPILLARY
GLUCOSE-CAPILLARY: 141 mg/dL — AB (ref 70–99)
GLUCOSE-CAPILLARY: 170 mg/dL — AB (ref 70–99)
GLUCOSE-CAPILLARY: 191 mg/dL — AB (ref 70–99)
Glucose-Capillary: 116 mg/dL — ABNORMAL HIGH (ref 70–99)
Glucose-Capillary: 151 mg/dL — ABNORMAL HIGH (ref 70–99)
Glucose-Capillary: 185 mg/dL — ABNORMAL HIGH (ref 70–99)

## 2018-08-10 LAB — RAPID URINE DRUG SCREEN, HOSP PERFORMED
AMPHETAMINES: NOT DETECTED
Barbiturates: NOT DETECTED
Benzodiazepines: NOT DETECTED
COCAINE: NOT DETECTED
OPIATES: NOT DETECTED
TETRAHYDROCANNABINOL: NOT DETECTED

## 2018-08-10 LAB — BASIC METABOLIC PANEL
Anion gap: 10 (ref 5–15)
BUN: 12 mg/dL (ref 8–23)
CALCIUM: 9.1 mg/dL (ref 8.9–10.3)
CO2: 25 mmol/L (ref 22–32)
CREATININE: 0.87 mg/dL (ref 0.44–1.00)
Chloride: 107 mmol/L (ref 98–111)
Glucose, Bld: 172 mg/dL — ABNORMAL HIGH (ref 70–99)
Potassium: 3.6 mmol/L (ref 3.5–5.1)
SODIUM: 142 mmol/L (ref 135–145)

## 2018-08-10 LAB — STREP PNEUMONIAE URINARY ANTIGEN: STREP PNEUMO URINARY ANTIGEN: NEGATIVE

## 2018-08-10 LAB — HEPATIC FUNCTION PANEL
ALK PHOS: 40 U/L (ref 38–126)
ALT: 39 U/L (ref 0–44)
AST: 33 U/L (ref 15–41)
Albumin: 3.6 g/dL (ref 3.5–5.0)
BILIRUBIN INDIRECT: 0.9 mg/dL (ref 0.3–0.9)
Bilirubin, Direct: 0.2 mg/dL (ref 0.0–0.2)
TOTAL PROTEIN: 7 g/dL (ref 6.5–8.1)
Total Bilirubin: 1.1 mg/dL (ref 0.3–1.2)

## 2018-08-10 LAB — CBC WITH DIFFERENTIAL/PLATELET
BASOS ABS: 0 10*3/uL (ref 0.0–0.1)
Basophils Relative: 0 %
Eosinophils Absolute: 0 10*3/uL (ref 0.0–0.7)
Eosinophils Relative: 0 %
HCT: 40.3 % (ref 36.0–46.0)
Hemoglobin: 12.5 g/dL (ref 12.0–15.0)
LYMPHS ABS: 0.8 10*3/uL (ref 0.7–4.0)
Lymphocytes Relative: 11 %
MCH: 21.5 pg — ABNORMAL LOW (ref 26.0–34.0)
MCHC: 31 g/dL (ref 30.0–36.0)
MCV: 69.4 fL — ABNORMAL LOW (ref 78.0–100.0)
MONOS PCT: 5 %
Monocytes Absolute: 0.4 10*3/uL (ref 0.1–1.0)
NEUTROS PCT: 84 %
Neutro Abs: 6.4 10*3/uL (ref 1.7–7.7)
PLATELETS: 261 10*3/uL (ref 150–400)
RBC: 5.81 MIL/uL — AB (ref 3.87–5.11)
RDW: 14.8 % (ref 11.5–15.5)
WBC: 7.6 10*3/uL (ref 4.0–10.5)

## 2018-08-10 LAB — ECHOCARDIOGRAM COMPLETE
HEIGHTINCHES: 63 in
Weight: 1760 oz

## 2018-08-10 MED ORDER — SODIUM CHLORIDE 0.9 % IV SOLN
INTRAVENOUS | Status: DC
  Administered 2018-08-10: 14:00:00 via INTRAVENOUS

## 2018-08-10 MED ORDER — METOPROLOL TARTRATE 5 MG/5ML IV SOLN
5.0000 mg | Freq: Three times a day (TID) | INTRAVENOUS | Status: DC
  Administered 2018-08-10 (×2): 5 mg via INTRAVENOUS
  Filled 2018-08-10 (×3): qty 5

## 2018-08-10 MED ORDER — WARFARIN SODIUM 7.5 MG PO TABS
7.5000 mg | ORAL_TABLET | Freq: Once | ORAL | Status: AC
  Filled 2018-08-10: qty 1

## 2018-08-10 MED ORDER — WARFARIN - PHARMACIST DOSING INPATIENT
Freq: Every day | Status: DC
  Administered 2018-08-14: 18:00:00

## 2018-08-10 MED ORDER — CARVEDILOL 25 MG PO TABS
25.0000 mg | ORAL_TABLET | Freq: Two times a day (BID) | ORAL | Status: DC
  Filled 2018-08-10 (×2): qty 1

## 2018-08-10 MED ORDER — METOPROLOL TARTRATE 5 MG/5ML IV SOLN
2.5000 mg | Freq: Three times a day (TID) | INTRAVENOUS | Status: DC | PRN
  Administered 2018-08-11: 2.5 mg via INTRAVENOUS
  Filled 2018-08-10: qty 5

## 2018-08-10 NOTE — Progress Notes (Signed)
CRITICAL VALUE ALERT  Critical Value:  Lactic Acid 2.0  Date & Time Notied: 08/10/2018   0305  Provider Notified: Kennon Holter  Orders Received/Actions taken: No new orders

## 2018-08-10 NOTE — Progress Notes (Signed)
New Admission Note:  Arrival Method: By bed from ED around 2200 Mental Orientation: Alert and oriented x4 Telemetry: Box 14, CCMD notified Assessment: Completed Skin: Completed, refer to flowsheets IV: Right A.C. S.L. Pain: Denies  Tubes: None Safety Measures: Safety Fall Prevention Plan was given, discussed  Admission: Completed [facility] Orientation: Patient has been orientated to the room, unit and the staff. Family: Husband at bedside  Orders have been reviewed and implemented. Will continue to monitor the patient. Call light has been placed within reach.  Perry Mount, RN  Phone Number: [phone]

## 2018-08-10 NOTE — Progress Notes (Signed)
ANTICOAGULATION CONSULT NOTE --Follow up Pharmacy Consult for heparin, coumadin Indication: hx LV thrombus/CVA  No Known Allergies  Patient Measurements: Height: 5\' 3"  (160 cm) Weight: 110 lb (49.9 kg) IBW/kg (Calculated) : 52.4 Heparin Dosing Weight: 49.9 kg   Vital Signs: Temp: 98.9 F (37.2 C) (09/16 0421) BP: 142/95 (09/16 0421) Pulse Rate: 113 (09/16 0421)  Labs: Recent Labs    08/09/18 1321 08/09/18 1643 08/10/18 0211  HGB 12.9  --  12.5  HCT 42.0  --  40.3  PLT 291  --  261  LABPROT  --  25.7* 25.1*  INR  --  2.37 2.30  CREATININE 0.82  --  0.87  TROPONINI  --  <0.03  --     Estimated Creatinine Clearance: 52.1 mL/min (by C-G formula based on SCr of 0.87 mg/dL).   Medical History: Past Medical History:  Diagnosis Date  . Allergic rhinitis    Requires cetirizine, singulair, and fluticasone.  . Anxiety    Has been on Xanax since 2009. Uses it for stress, anxiety, and insomnia. No contract yet.  . Arthritis   . CHF (congestive heart failure) (HCC)    EF 35% after stroke, presumed ischemic  . Chronic pain    Has OA of knees B. No Xrays in echart. Not requiring narcotics.  . Colitis 12/2017  . Depression   . Diabetes mellitus    Type 2, non insulin dependent. Was dx'd prior to 2008.  Marland Kitchen Hyperglycemia   . Hyperlipemia   . Hypertension   . Sickle cell trait ([place])   . 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 carvedilol  25 mg Oral BID WC  . insulin aspart  0-9 Units Subcutaneous Q4H    Assessment: 39 yom presenting with mid-lower abdominal pain and nausea/vomiting x1-2 days. On warfarin PTA for hx of LV thrombus/CVA. Home regimen is 5 mg daily except 7.5 mg on Monday/Friday. INR on admission today was 2.37 -warfarin to restart today if patient can take po; Heparin to start if INR < 2.0   Goal of Therapy:  INR 2-3 Monitor platelets by anticoagulation protocol: Yes    Plan:  -Warfarin 7.5mg  po today -Daily PT/INR  Hildred Laser, PharmD Clinical Pharmacist Please check Amion for pharmacy contact number

## 2018-08-10 NOTE — Progress Notes (Signed)
ANTICOAGULATION CONSULT NOTE --Follow up Pharmacy Consult for heparin Indication: hx LV thrombus/CVA  No Known Allergies  Patient Measurements: Height: 5\' 3"  (160 cm) Weight: 110 lb (49.9 kg) IBW/kg (Calculated) : 52.4 Heparin Dosing Weight: 49.9 kg   Vital Signs: Temp: 98.2 F (36.8 C) (09/15 2215) BP: 138/97 (09/16 0148) Pulse Rate: 122 (09/16 0148)  Labs: Recent Labs    08/09/18 1321 08/09/18 1643 08/10/18 0211  HGB 12.9  --  12.5  HCT 42.0  --  40.3  PLT 291  --  261  LABPROT  --  25.7* 25.1*  INR  --  2.37 2.30  CREATININE 0.82  --  0.87  TROPONINI  --  <0.03  --     Estimated Creatinine Clearance: 52.1 mL/min (by C-G formula based on SCr of 0.87 mg/dL).   Medical History: Past Medical History:  Diagnosis Date  . Allergic rhinitis    Requires cetirizine, singulair, and fluticasone.  . Anxiety    Has been on Xanax since 2009. Uses it for stress, anxiety, and insomnia. No contract yet.  . Arthritis   . CHF (congestive heart failure) (HCC)    EF 35% after stroke, presumed ischemic  . Chronic pain    Has OA of knees B. No Xrays in echart. Not requiring narcotics.  . Colitis 12/2017  . Depression   . Diabetes mellitus    Type 2, non insulin dependent. Was dx'd prior to 2008.  Marland Kitchen Hyperglycemia   . Hyperlipemia   . Hypertension   . Sickle cell trait ([place])   . 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 insulin aspart  0-9 Units Subcutaneous Q4H  . metoprolol tartrate  5 mg Intravenous Q8H    Assessment: 20 yom presenting with mid-lower abdominal pain and nausea/vomiting x1-2 days. On warfarin PTA for hx of LV thrombus/CVA. Home regimen is 5 mg daily except 7.5 mg on Monday/Friday.   INR on admission today is 2.37. Last dose was 9/13. . Of note, did receive azithromycin yesterday which can impact INR sensitivity.   Plan to start heparin infusion once INR<2.  INR this AM 9/16  is 2.30 CBC is within normal. No bleeding noted.  Goal of Therapy:  INR 2-3 Monitor platelets by anticoagulation protocol: Yes   Plan:  Continue to hold warfarin Monitor daily INR- start heparin infusion when INR<2 Monitor CBC and for s/sx of bleeding   Nicole Cella, RPh Clinical Pharmacist Pager: [phone] Please check AMION for all [place] phone numbers After 10:00 PM, call [place] [phone]  08/10/2018,4:02 AM

## 2018-08-10 NOTE — Progress Notes (Addendum)
PROGRESS NOTE    Kristen Lozano  YSA:[id number] DOB: 05-08-1956 DOA: 08/09/2018 PCP: Audley Hose, MD   Brief Narrative:  62 year old woman with a history of LV thrombus and CVA, on anticoagulation, history of hypertension, diabetes type 2, presenting to the emergency department with persistent nausea and vomiting over the last 4 days, accompanied by lower abdominal crampy pain, without diarrhea and non productive.  No apparent food poisoning, sick recent contacts or recent travel. She was not on antibiotics. without fever or chills.  CT of the abdomen and pelvis was remarkable for possible LLQ infectious versus inflammatory changes.  She was placed on empiric antibiotics with azithromycin and ceftriaxone. She is now on Zosyn.   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  Patient was placed n.p.o, given IV antiemetics with improvement of symptoms., lactic acid is 2.1.  White count is 7.6.  Afebrile.  Continue Zosyn IV per pharmacy for intra-abdominal infection  Pneumonia, likely due to aspiration.  Patient received ceftriaxone and Zithromax in the ER, now the patient is on Zosyn white count is 6.9.  Lactic acid 2.1. Legionella and strep pneumonia are pending to rule out atypical pneumonia Urine culture pending Continue Zosyn Gentle IVF at 76 cc/h  -echo to r/o heart failure  Hypertension BP   -resume home meds as able  Hyperlipidemia Continue home Lipitor   Chronic systolic and diastolic heart failure, last EF in 2017 was 40 to 45%.  BNP on admission was 1100, but   not hypoxic.  She has not been taking Coreg due to vomiting over the last 4 days prior to admission.  Repeat 2 D echo today     History of diabetes uncontrolled. A1C 12.5, similar to prior  Januvia and Glucophage were held  Sliding scale insulin   History of LV thrombus.  Per chart report, there is a history of atrial fibrillation and a history of embolic stroke, the patient is on Coumadin.  She had not taken this medicine due to vomiting over 4 days prior to admission.  She was placed on heparin when INR <2--- hope to not need and add back coumadin if able to take PO  DVT prophylaxis: Heparin  Code Status: Full code   Family Communication: none Disposition Plan: home when stable   Consultants:   None   Procedures:  None  Antimicrobials:  Zosyn per Pharmacy   Subjective:  she is a poor historian and is tired so does not provide much history Vital signs show she is sinus tachy- NPO so will introduce clears for now  Objective: Vitals:   08/09/18 2145 08/09/18 2215 08/10/18 0148 08/10/18 0421  BP:  (!) 125/91 (!) 138/97 (!) 142/95  Pulse: (!) 121 (!) 129 (!) 122 (!) 113  Resp: 19 18  18   Temp:  98.2 F (36.8 C)  98.9 F (37.2 C)  TempSrc:      SpO2: 99% 95% 97% 92%  Weight:      Height:        Intake/Output Summary (Last 24 hours) at 08/10/2018 1300 Last  data filed at 08/10/2018 0640 Gross per 24 hour  Intake 291.25 ml  Output -  Net 291.25 ml   Filed Weights   08/09/18 2130  Weight: 49.9 kg    Examination:  General exam: asleep and will awaken briefly Oral cavity: Edentulous, no thrush is notable. Respiratory system: Clear to auscultation. Respiratory effort normal. Cardiovascular system: S1 & S2 heard, tachy with occasional ectopic beat . No JVD, murmurs, rubs, gallops or clicks. No pedal edema. Gastrointestinal system: Abdomen is nondistended, soft and nontender. No organomegaly or masses felt. Normal bowel sounds heard. Extremities: Symmetric 5 x 5  power. Psychiatry:   Flat affect.     Data Reviewed: I have personally reviewed following labs and imaging studies  CBC: Recent Labs  Lab 08/09/18 1321 08/10/18 0211  WBC 6.9 7.6  NEUTROABS  --  6.4  HGB 12.9 12.5  HCT 42.0 40.3  MCV 70.4* 69.4*  PLT 291 284   Basic Metabolic Panel: Recent Labs  Lab 08/09/18 1321 08/10/18 0211  NA 139 142  K 3.8 3.6  CL 107 107  CO2 22 25  GLUCOSE 183* 172*  BUN 12 12  CREATININE 0.82 0.87  CALCIUM 9.1 9.1   GFR: Estimated Creatinine Clearance: 52.1 mL/min (by C-G formula based on SCr of 0.87 mg/dL). Liver Function Tests: Recent Labs  Lab 08/09/18 1321 08/10/18 0211  AST 29 33  ALT 32 39  ALKPHOS 41 40  BILITOT 1.1 1.1  PROT 6.9 7.0  ALBUMIN 3.8 3.6   Recent Labs  Lab 08/09/18 1321  LIPASE 30   No results for input(s): AMMONIA in the last 168 hours. Coagulation Profile: Recent Labs  Lab 08/05/18 1133 08/09/18 1643 08/10/18 0211  INR 2.2 2.37 2.30   Cardiac Enzymes: Recent Labs  Lab 08/09/18 1643  TROPONINI <0.03   BNP (last 3 results) No results for input(s): PROBNP in the last 8760 hours. HbA1C: No results for input(s): HGBA1C in the last 72 hours. CBG: Recent Labs  Lab 08/09/18 2215 08/10/18 0007 08/10/18 0421 08/10/18 0740 08/10/18 1008  GLUCAP 225* 170* 116* 151* 191*   Lipid Profile: No results for input(s): CHOL, HDL, LDLCALC, TRIG, CHOLHDL, LDLDIRECT in the last 72 hours. Thyroid Function Tests: No results for input(s): TSH, T4TOTAL, FREET4, T3FREE, THYROIDAB in the last 72 hours. Anemia Panel: No results for input(s): VITAMINB12, FOLATE, FERRITIN, TIBC, IRON, RETICCTPCT in the last 72 hours. Sepsis Labs: Recent Labs  Lab 08/09/18 1343 08/09/18 1500 08/10/18 0211 08/10/18 0532  LATICACIDVEN 2.10* 1.53 2.0* 2.1*    Recent Results (from the past 240 hour(s))  Blood culture (routine x 2)     Status: None (Preliminary result)   Collection Time: 08/09/18  4:42 PM  Result Value Ref  Range Status   Specimen Description BLOOD LEFT FOREARM  Final   Special Requests   Final    BOTTLES DRAWN AEROBIC ONLY Blood Culture results may not be optimal due to an excessive volume of blood received in culture bottles   Culture   Final    NO GROWTH < 24 HOURS Performed at [facility] [address], [place],  [address]    Report Status PENDING  Incomplete  Blood culture (routine x 2)     Status: None (Preliminary result)   Collection Time: 08/09/18  4:44 PM  Result Value Ref Range Status   Specimen Description BLOOD RIGHT ANTECUBITAL  Final   Special Requests   Final    BOTTLES DRAWN AEROBIC AND ANAEROBIC  Blood Culture adequate volume   Culture   Final    NO GROWTH < 24 HOURS Performed at [facility], [place] [address] [place], [place] [address]    Report Status PENDING  Incomplete         Radiology Studies: Dg Chest 2 View  Result Date: 08/09/2018 CLINICAL DATA:  Generalized abdominal pain. Nausea, vomiting and diarrhea. EXAM: CHEST - 2 VIEW COMPARISON:  Chest x-ray dated 09/04/2014. FINDINGS: Cardiomegaly, partially obscured by dense bibasilar opacities. Upper lungs are relatively clear. Osseous structures about the chest are unremarkable. IMPRESSION: 1. Dense bibasilar opacities, suspicious for bilateral pneumonia, less likely asymmetric pulmonary edema. 2. Probable bilateral pleural effusions, small to moderate in size. 3. Probable cardiomegaly, difficult to definitively characterize due to obscuration by the bibasilar opacities. Electronically Signed   By: Franki Cabot M.D.   On: 08/09/2018 14:11   Ct Abdomen Pelvis W Contrast  Result Date: 08/09/2018 CLINICAL DATA:  Abdominal pain with nausea, vomiting and diarrhea 3 days. EXAM: CT ABDOMEN AND PELVIS WITH CONTRAST TECHNIQUE: Multidetector CT imaging of the abdomen and pelvis was performed using the standard protocol following bolus administration of intravenous contrast. CONTRAST:  133m OMNIPAQUE  IOHEXOL 300 MG/ML  SOLN COMPARISON:  01/09/2018 and 09/07/2014 FINDINGS: Lower chest: Examination demonstrates moderate size bilateral pleural effusions with associated compressive atelectasis in the lung bases. Mild cardiomegaly. Hepatobiliary: Mild diffuse low-attenuation of the liver without focal mass. Gallbladder and biliary tree are normal. Pancreas: Normal. Spleen: Normal. Adrenals/Urinary Tract: Adrenal glands are normal. Kidneys are normal in size with patchy bilateral renal cortical scarring. Few small subcentimeter bilateral cortical hypodensities too small to characterize but likely cysts. No hydronephrosis or nephrolithiasis. Bladder and ureters are normal. Stomach/Bowel: Stomach is within normal. Mild wall thickening of several small bowel loops over the left lower quadrant which may be due to regional enteritis of infectious or inflammatory nature. No bowel obstruction. Appendix is normal. Mild decompression of the transverse and descending colon as the colon is otherwise unremarkable. Vascular/Lymphatic: Normal. Reproductive: Normal. Other: No free fluid or focal inflammatory change. Musculoskeletal: Unremarkable. IMPRESSION: Few small bowel loops over the left lower quadrant with wall thickening which may be due to a regional enteritis of infectious or inflammatory nature. No bowel obstruction. Moderate size bilateral pleural effusions with bibasilar atelectasis. Bilateral renal cortical scarring. Couple small subcentimeter bilateral renal cortical hypodensities too small to characterize but likely cysts. Attic steatosis. Mild cardiomegaly. Electronically Signed   By: DMarin OlpM.D.   On: 08/09/2018 16:46   Dg Abd Acute W/chest  Result Date: 08/10/2018 CLINICAL DATA:  Abdomen pain with nausea and vomiting EXAM: DG ABDOMEN ACUTE W/ 1V CHEST COMPARISON:  CT 08/09/2018, radiograph 08/09/2018 FINDINGS: Single-view chest demonstrates cardiomegaly with vascular congestion, bilateral pleural  effusion, and bibasilar consolidations. Increased peripheral opacity in the right mid lung. Supine and upright views of the abdomen demonstrate no free air beneath the diaphragm. Paucity of abdominal bowel gas which is nonspecific. Residual contrast within the urinary bladder and renal collecting systems. IMPRESSION: 1. Small moderate bilateral pleural effusions. Cardiomegaly with vascular congestion and mild interstitial edema. Bibasilar atelectasis or pneumonia. Increasing peripheral infiltrate in the right mid lung. 2. Nonspecific diffuse decreased bowel gas. Electronically Signed   By: KDonavan FoilM.D.   On: 08/10/2018 01:54        Scheduled Meds: . carvedilol  25 mg Oral BID WC  . insulin aspart  0-9 Units Subcutaneous Q4H  . metoprolol tartrate  5 mg Intravenous  Q8H   Continuous Infusions: . piperacillin-tazobactam (ZOSYN)  IV 3.375 g (08/10/18 0640)     LOS: 0 days        Eulogio Bear DO [facility]   If 7PM-7AM, please contact night-coverage [email or web address] Password [email or web address] 08/10/2018, 1:00 PM

## 2018-08-10 NOTE — Progress Notes (Signed)
  Echocardiogram 2D Echocardiogram has been performed.  Jennette Dubin 08/10/2018, 3:37 PM

## 2018-08-11 ENCOUNTER — Encounter (HOSPITAL_COMMUNITY): Payer: Self-pay | Admitting: General Practice

## 2018-08-11 DIAGNOSIS — I5023 Acute on chronic systolic (congestive) heart failure: Secondary | ICD-10-CM

## 2018-08-11 DIAGNOSIS — R112 Nausea with vomiting, unspecified: Secondary | ICD-10-CM | POA: Diagnosis not present

## 2018-08-11 DIAGNOSIS — Z8041 Family history of malignant neoplasm of ovary: Secondary | ICD-10-CM | POA: Diagnosis not present

## 2018-08-11 DIAGNOSIS — Z818 Family history of other mental and behavioral disorders: Secondary | ICD-10-CM | POA: Diagnosis not present

## 2018-08-11 DIAGNOSIS — D573 Sickle-cell trait: Secondary | ICD-10-CM | POA: Diagnosis present

## 2018-08-11 DIAGNOSIS — K5000 Crohn's disease of small intestine without complications: Secondary | ICD-10-CM | POA: Diagnosis present

## 2018-08-11 DIAGNOSIS — R1084 Generalized abdominal pain: Secondary | ICD-10-CM | POA: Diagnosis not present

## 2018-08-11 DIAGNOSIS — R918 Other nonspecific abnormal finding of lung field: Secondary | ICD-10-CM | POA: Diagnosis not present

## 2018-08-11 DIAGNOSIS — E1159 Type 2 diabetes mellitus with other circulatory complications: Secondary | ICD-10-CM | POA: Diagnosis not present

## 2018-08-11 DIAGNOSIS — E876 Hypokalemia: Secondary | ICD-10-CM | POA: Diagnosis present

## 2018-08-11 DIAGNOSIS — R000 Tachycardia, unspecified: Secondary | ICD-10-CM | POA: Diagnosis not present

## 2018-08-11 DIAGNOSIS — I420 Dilated cardiomyopathy: Secondary | ICD-10-CM

## 2018-08-11 DIAGNOSIS — Z833 Family history of diabetes mellitus: Secondary | ICD-10-CM | POA: Diagnosis not present

## 2018-08-11 DIAGNOSIS — Z800 Family history of malignant neoplasm of digestive organs: Secondary | ICD-10-CM | POA: Diagnosis not present

## 2018-08-11 DIAGNOSIS — I10 Essential (primary) hypertension: Secondary | ICD-10-CM | POA: Diagnosis not present

## 2018-08-11 DIAGNOSIS — I5021 Acute systolic (congestive) heart failure: Secondary | ICD-10-CM

## 2018-08-11 DIAGNOSIS — I236 Thrombosis of atrium, auricular appendage, and ventricle as current complications following acute myocardial infarction: Secondary | ICD-10-CM | POA: Diagnosis not present

## 2018-08-11 DIAGNOSIS — Z23 Encounter for immunization: Secondary | ICD-10-CM | POA: Diagnosis present

## 2018-08-11 DIAGNOSIS — K529 Noninfective gastroenteritis and colitis, unspecified: Secondary | ICD-10-CM | POA: Diagnosis not present

## 2018-08-11 DIAGNOSIS — F419 Anxiety disorder, unspecified: Secondary | ICD-10-CM | POA: Diagnosis present

## 2018-08-11 DIAGNOSIS — E119 Type 2 diabetes mellitus without complications: Secondary | ICD-10-CM | POA: Diagnosis present

## 2018-08-11 DIAGNOSIS — I110 Hypertensive heart disease with heart failure: Secondary | ICD-10-CM | POA: Diagnosis present

## 2018-08-11 DIAGNOSIS — Z8249 Family history of ischemic heart disease and other diseases of the circulatory system: Secondary | ICD-10-CM | POA: Diagnosis not present

## 2018-08-11 DIAGNOSIS — J90 Pleural effusion, not elsewhere classified: Secondary | ICD-10-CM | POA: Diagnosis not present

## 2018-08-11 DIAGNOSIS — Z8673 Personal history of transient ischemic attack (TIA), and cerebral infarction without residual deficits: Secondary | ICD-10-CM | POA: Diagnosis not present

## 2018-08-11 DIAGNOSIS — E785 Hyperlipidemia, unspecified: Secondary | ICD-10-CM | POA: Diagnosis present

## 2018-08-11 DIAGNOSIS — M170 Bilateral primary osteoarthritis of knee: Secondary | ICD-10-CM | POA: Diagnosis present

## 2018-08-11 DIAGNOSIS — I5043 Acute on chronic combined systolic (congestive) and diastolic (congestive) heart failure: Secondary | ICD-10-CM | POA: Diagnosis present

## 2018-08-11 DIAGNOSIS — G8929 Other chronic pain: Secondary | ICD-10-CM | POA: Diagnosis present

## 2018-08-11 DIAGNOSIS — Z681 Body mass index (BMI) 19 or less, adult: Secondary | ICD-10-CM | POA: Diagnosis not present

## 2018-08-11 DIAGNOSIS — Z832 Family history of diseases of the blood and blood-forming organs and certain disorders involving the immune mechanism: Secondary | ICD-10-CM | POA: Diagnosis not present

## 2018-08-11 DIAGNOSIS — Z7984 Long term (current) use of oral hypoglycemic drugs: Secondary | ICD-10-CM | POA: Diagnosis not present

## 2018-08-11 DIAGNOSIS — J690 Pneumonitis due to inhalation of food and vomit: Secondary | ICD-10-CM | POA: Diagnosis present

## 2018-08-11 DIAGNOSIS — F329 Major depressive disorder, single episode, unspecified: Secondary | ICD-10-CM | POA: Diagnosis present

## 2018-08-11 DIAGNOSIS — R1030 Lower abdominal pain, unspecified: Secondary | ICD-10-CM | POA: Diagnosis not present

## 2018-08-11 DIAGNOSIS — R110 Nausea: Secondary | ICD-10-CM | POA: Diagnosis present

## 2018-08-11 LAB — GLUCOSE, CAPILLARY
GLUCOSE-CAPILLARY: 109 mg/dL — AB (ref 70–99)
GLUCOSE-CAPILLARY: 140 mg/dL — AB (ref 70–99)
GLUCOSE-CAPILLARY: 161 mg/dL — AB (ref 70–99)
GLUCOSE-CAPILLARY: 185 mg/dL — AB (ref 70–99)
GLUCOSE-CAPILLARY: 214 mg/dL — AB (ref 70–99)
Glucose-Capillary: 239 mg/dL — ABNORMAL HIGH (ref 70–99)
Glucose-Capillary: 132 mg/dL — ABNORMAL HIGH (ref 70–99)

## 2018-08-11 LAB — BASIC METABOLIC PANEL
Anion gap: 17 — ABNORMAL HIGH (ref 5–15)
BUN: 12 mg/dL (ref 8–23)
CALCIUM: 9.3 mg/dL (ref 8.9–10.3)
CO2: 24 mmol/L (ref 22–32)
CREATININE: 0.9 mg/dL (ref 0.44–1.00)
Chloride: 100 mmol/L (ref 98–111)
Glucose, Bld: 159 mg/dL — ABNORMAL HIGH (ref 70–99)
Potassium: 2.7 mmol/L — CL (ref 3.5–5.1)
SODIUM: 141 mmol/L (ref 135–145)

## 2018-08-11 LAB — PROTIME-INR
INR: 2.71
Prothrombin Time: 28.6 seconds — ABNORMAL HIGH (ref 11.4–15.2)

## 2018-08-11 LAB — CBC
HCT: 40.2 % (ref 36.0–46.0)
Hemoglobin: 13 g/dL (ref 12.0–15.0)
MCH: 21.9 pg — ABNORMAL LOW (ref 26.0–34.0)
MCHC: 32.3 g/dL (ref 30.0–36.0)
MCV: 67.7 fL — ABNORMAL LOW (ref 78.0–100.0)
PLATELETS: 279 10*3/uL (ref 150–400)
RBC: 5.94 MIL/uL — ABNORMAL HIGH (ref 3.87–5.11)
RDW: 14.4 % (ref 11.5–15.5)
WBC: 11.9 10*3/uL — ABNORMAL HIGH (ref 4.0–10.5)

## 2018-08-11 LAB — MAGNESIUM: Magnesium: 1.7 mg/dL (ref 1.7–2.4)

## 2018-08-11 LAB — LACTIC ACID, PLASMA: Lactic Acid, Venous: 1.8 mmol/L (ref 0.5–1.9)

## 2018-08-11 MED ORDER — POTASSIUM CHLORIDE 10 MEQ/100ML IV SOLN: 10.0000 meq | INTRAVENOUS | Status: DC

## 2018-08-11 MED ORDER — POTASSIUM CHLORIDE 10 MEQ/100ML IV SOLN
10.0000 meq | INTRAVENOUS | Status: AC
  Administered 2018-08-11 (×5): 10 meq via INTRAVENOUS
  Filled 2018-08-11 (×3): qty 100

## 2018-08-11 MED ORDER — METOPROLOL TARTRATE 5 MG/5ML IV SOLN
5.0000 mg | Freq: Four times a day (QID) | INTRAVENOUS | Status: DC
  Administered 2018-08-11 – 2018-08-12 (×3): 5 mg via INTRAVENOUS
  Filled 2018-08-11 (×3): qty 5

## 2018-08-11 MED ORDER — SODIUM CHLORIDE 0.9 % IV SOLN: INTRAVENOUS | Status: DC

## 2018-08-11 MED ORDER — MAGNESIUM SULFATE 2 GM/50ML IV SOLN
2.0000 g | Freq: Once | INTRAVENOUS | Status: AC
  Administered 2018-08-11: 2 g via INTRAVENOUS
  Filled 2018-08-11: qty 50

## 2018-08-11 MED ORDER — FUROSEMIDE 10 MG/ML IJ SOLN
40.0000 mg | Freq: Two times a day (BID) | INTRAMUSCULAR | Status: DC
  Administered 2018-08-11 – 2018-08-12 (×2): 40 mg via INTRAVENOUS
  Filled 2018-08-11 (×2): qty 4

## 2018-08-11 MED ORDER — POTASSIUM CHLORIDE CRYS ER 20 MEQ PO TBCR
40.0000 meq | EXTENDED_RELEASE_TABLET | Freq: Every day | ORAL | Status: DC
  Administered 2018-08-12: 40 meq via ORAL
  Filled 2018-08-11: qty 2

## 2018-08-11 MED ORDER — POTASSIUM CHLORIDE 10 MEQ/100ML IV SOLN
10.0000 meq | INTRAVENOUS | Status: AC
  Administered 2018-08-11 (×3): 10 meq via INTRAVENOUS
  Filled 2018-08-11 (×4): qty 100

## 2018-08-11 MED ORDER — WARFARIN SODIUM 2.5 MG PO TABS
2.5000 mg | ORAL_TABLET | Freq: Once | ORAL | Status: AC
  Administered 2018-08-11: 2.5 mg via ORAL
  Filled 2018-08-11: qty 1

## 2018-08-11 MED ORDER — METOCLOPRAMIDE HCL 5 MG/ML IJ SOLN
5.0000 mg | Freq: Three times a day (TID) | INTRAMUSCULAR | Status: DC
  Administered 2018-08-11 – 2018-08-15 (×14): 5 mg via INTRAVENOUS
  Filled 2018-08-11 (×14): qty 2

## 2018-08-11 MED ORDER — INFLUENZA VAC SPLIT QUAD 0.5 ML IM SUSY
0.5000 mL | PREFILLED_SYRINGE | INTRAMUSCULAR | Status: AC
  Administered 2018-08-12: 0.5 mL via INTRAMUSCULAR
  Filled 2018-08-11: qty 0.5

## 2018-08-11 NOTE — Progress Notes (Signed)
ANTICOAGULATION CONSULT NOTE --Follow up  Pharmacy Consult for Warfarin, Hep if INR<2 Indication: hx LV thrombus/CVA  No Known Allergies  Patient Measurements: Height: 5\' 3"  (160 cm) Weight: 109 lb 15.8 oz (49.9 kg) IBW/kg (Calculated) : 52.4 Heparin Dosing Weight: 49.9 kg   Vital Signs: Temp: 98.5 F (36.9 C) (09/17 0414) Temp Source: Oral (09/17 0414) BP: 154/104 (09/17 0414) Pulse Rate: 121 (09/17 0414)  Labs: Recent Labs    08/09/18 1321 08/09/18 1643 08/10/18 0211 08/11/18 0636  HGB 12.9  --  12.5 13.0  HCT 42.0  --  40.3 40.2  PLT 291  --  261 279  LABPROT  --  25.7* 25.1* 28.6*  INR  --  2.37 2.30 2.71  CREATININE 0.82  --  0.87 0.90  TROPONINI  --  <0.03  --   --     Estimated Creatinine Clearance: 50.4 mL/min (by C-G formula based on SCr of 0.9 mg/dL).   Medications:  Scheduled:  . carvedilol  25 mg Oral BID WC  . insulin aspart  0-9 Units Subcutaneous Q4H  . metoCLOPramide (REGLAN) injection  5 mg Intravenous Q8H  . warfarin  7.5 mg Oral ONCE-1800  . Warfarin - Pharmacist Dosing Inpatient   Does not apply q1800    Assessment: 9 YOF  presenting with mid-lower abdominal pain and nausea/vomiting x1-2 days. On warfarin PTA for hx of LV thrombus/CVA. Home regimen is 5 mg daily except 7.5 mg on Monday/Friday. INR on admission today was 2.37. Warfarin held initially while NPO, then pharmacy consulted to resume on 9/16 evening.   INR today is therapeutic despite the fact that the patient refused her warfarin dose yesterday evening (INR 2.71 << 2.3, goal of 2-3). CBC wnl and stable. Despite starting a clear diet - the patient still has poor po intake charted which will increase the warfarin sensitivity.  Goal of Therapy:  INR 2-3 Monitor platelets by anticoagulation protocol: Yes   Plan:  - Warfarin 2.5 x 1 dose at 1800 today - Will continue to monitor for any signs/symptoms of bleeding and will follow up with PT/INR in the a.m.   Thank you for allowing  pharmacy to be a part of this patient's care.  Alycia Rossetti, PharmD, BCPS Clinical Pharmacist Pager: [phone] Clinical phone for 08/11/2018 from 7a-3:30p: [phone] If after 3:30p, please call main pharmacy at: [phone] Please check AMION for all [facility] numbers 08/11/2018 8:46 AM

## 2018-08-11 NOTE — Progress Notes (Addendum)
PROGRESS NOTE    Kristen Lozano  NUU:[id number] DOB: 1956-05-25 DOA: 08/09/2018 PCP: Audley Hose, MD   Brief Narrative:   62 year old woman with a history of LV thrombus and CVA, on anticoagulation, history of hypertension, diabetes type 2, presenting to the ER with persistent nausea and vomiting over the last 4 days, accompanied by lower abdominal crampy pain, without diarrhea and non productive.  No apparent food poisoning, sick recent contacts or recent travel. She was not on antibiotics. without fever or chills.  CT of the abdomen and pelvis was remarkable for possible LLQ infectious versus inflammatory changes (similar to prior hospitalization in 12/2017).  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   -still nauseous and with dry heaves   Continue Zosyn IV per pharmacy for intra-abdominal infection, day 2 Reglan as she did not respond to zofran Advance diet as tolered  Pneumonia, likely due to aspiration.  Patient received ceftriaxone and Zithromax in the ER, now the patient is on Zosyn  day 2. WBC  11.9   BCX neg to date  Urine culture pending Tmx 99 Continue Zosyn day 2   Received Flu vaccine    Hypertension BP  Continue Lopressor IV as NOT able to take PO coreg   Hyperlipidemia Continue home Lipitor  Hypokalemia, likely due to volume loss was 2.7 this am -replace IV as not able to take PO -Mg lower end of normal  Chronic  systolic and diastolic heart failure,  BNP on admission was 1100, but  not hypoxic.  She has not been taking Coreg due to vomiting over the last 4 days prior to admission.  New 2 D echo shows EF 20-25% (prior was 45) with diffuse wall motion abnormalities Close monitoring of I and O's, and weight, currently at 49.9 kg Cardiology consult is appreciated, will follow recommendations, meds   History of diabetes uncontrolled. A1C 12.5, similar to prior  Januvia and Glucophage were held  Sliding scale insulin  History of LV thrombus, history of atrial fibrillation.  on Coumadin.  She had not taken this medicine due to vomiting over 4 days prior to admission.  She can be on heparin when INR <2, if not able to take Coumadin   DVT prophylaxis: Coumadin  Code Status: FUll code   Family Communication:  None (will need to confirm her baseline-- it appears from last admission she is chronically frail) Disposition Plan:  Home when stable   Consultants:   Cardiology  Procedures:  2 D echo, EF 20-50 percent, severely reduced systolic   Antimicrobials:   Zosyn day 2    Subjective: Still not able to keep down much food Denies pain  Objective: Vitals:   08/10/18 0421 08/10/18 2032 08/11/18 0414 08/11/18 0923  BP: (!) 142/95 136/85 (!) 154/104 (!) 162/98  Pulse: (!) 113 (!) 123 (!) 121 (!) 104  Resp: 18 18 18 18   Temp: 98.9 F (37.2 C) 98 F (  36.7 C) 98.5 F (36.9 C) 99 F (37.2 C)  TempSrc:  Oral Oral Oral  SpO2: 92% 97% 97% 98%  Weight:  49.9 kg    Height:        Intake/Output Summary (Last 24 hours) at 08/11/2018 1113 Last data filed at 08/11/2018 2130 Gross per 24 hour  Intake 364.85 ml  Output 0 ml  Net 364.85 ml   Filed Weights   08/09/18 2130 08/10/18 2032  Weight: 49.9 kg 49.9 kg    Examination:  General exam:  chronically ill appearing   Respiratory system: poor effort, diminished Cardiovascular system: tachy Gastrointestinal system: +BS, thin Central nervous  system: Alert, speech difficult to understand. Extremities: moves all 4 ext    Data Reviewed: I have personally reviewed following labs and imaging studies  CBC: Recent Labs  Lab 08/09/18 1321 08/10/18 0211 08/11/18 0636  WBC 6.9 7.6 11.9*  NEUTROABS  --  6.4  --   HGB 12.9 12.5 13.0  HCT 42.0 40.3 40.2  MCV 70.4* 69.4* 67.7*  PLT 291 261 865   Basic Metabolic Panel: Recent Labs  Lab 08/09/18 1321 08/10/18 0211 08/11/18 0636  NA 139 142 141  K 3.8 3.6 2.7*  CL 107 107 100  CO2 22 25 24   GLUCOSE 183* 172* 159*  BUN 12 12 12   CREATININE 0.82 0.87 0.90  CALCIUM 9.1 9.1 9.3  MG  --   --  1.7   GFR: Estimated Creatinine Clearance: 50.4 mL/min (by C-G formula based on SCr of 0.9 mg/dL). Liver Function Tests: Recent Labs  Lab 08/09/18 1321 08/10/18 0211  AST 29 33  ALT 32 39  ALKPHOS 41 40  BILITOT 1.1 1.1  PROT 6.9 7.0  ALBUMIN 3.8 3.6   Recent Labs  Lab 08/09/18 1321  LIPASE 30   No results for input(s): AMMONIA in the last 168 hours. Coagulation Profile: Recent Labs  Lab 08/05/18 1133 08/09/18 1643 08/10/18 0211 08/11/18 0636  INR 2.2 2.37 2.30 2.71   Cardiac Enzymes: Recent Labs  Lab 08/09/18 1643  TROPONINI <0.03   BNP (last 3 results) No results for input(s): PROBNP in the last 8760 hours. HbA1C: No results for input(s): HGBA1C in the last 72 hours. CBG: Recent Labs  Lab 08/10/18 1621 08/10/18 2032 08/11/18 0003 08/11/18 0414 08/11/18 0729  GLUCAP 185* 141* 140* 109* 161*   Lipid Profile: No results for input(s): CHOL, HDL, LDLCALC, TRIG, CHOLHDL, LDLDIRECT in the last 72 hours. Thyroid Function Tests: No results for input(s): TSH, T4TOTAL, FREET4, T3FREE, THYROIDAB in the last 72 hours. Anemia Panel: No results for input(s): VITAMINB12, FOLATE, FERRITIN, TIBC, IRON, RETICCTPCT in the last 72 hours. Sepsis Labs: Recent Labs  Lab 08/09/18 1500 08/10/18 0211 08/10/18 0532 08/10/18 1338  LATICACIDVEN 1.53 2.0* 2.1* 2.0*     Recent Results (from the past 240 hour(s))  Blood culture (routine x 2)     Status: None (Preliminary result)   Collection Time: 08/09/18  4:42 PM  Result Value Ref Range Status   Specimen Description BLOOD LEFT FOREARM  Final   Special Requests   Final    BOTTLES DRAWN AEROBIC ONLY Blood Culture results may not be optimal due to an excessive volume of blood received in culture bottles   Culture   Final    NO GROWTH < 24 HOURS Performed at [facility] [address], [place], [place] [address]    Report Status PENDING  Incomplete  Blood culture (routine x 2)  Status: None (Preliminary result)   Collection Time: 08/09/18  4:44 PM  Result Value Ref Range Status   Specimen Description BLOOD RIGHT ANTECUBITAL  Final   Special Requests   Final    BOTTLES DRAWN AEROBIC AND ANAEROBIC Blood Culture adequate volume   Culture   Final    NO GROWTH < 24 HOURS Performed at [facility], [address], [place], [place] [address]    Report Status PENDING  Incomplete         Radiology Studies: Dg Chest 2 View  Result Date: 08/09/2018 CLINICAL DATA:  Generalized abdominal pain. Nausea, vomiting and diarrhea. EXAM: CHEST - 2 VIEW COMPARISON:  Chest x-ray dated 09/04/2014. FINDINGS: Cardiomegaly, partially obscured by dense bibasilar opacities. Upper lungs are relatively clear. Osseous structures about the chest are unremarkable. IMPRESSION: 1. Dense bibasilar opacities, suspicious for bilateral pneumonia, less likely asymmetric pulmonary edema. 2. Probable bilateral pleural effusions, small to moderate in size. 3. Probable cardiomegaly, difficult to definitively characterize due to obscuration by the bibasilar opacities. Electronically Signed   By: Franki Cabot M.D.   On: 08/09/2018 14:11   Ct Abdomen Pelvis W Contrast  Result Date: 08/09/2018 CLINICAL DATA:  Abdominal pain with nausea, vomiting and diarrhea 3 days. EXAM: CT ABDOMEN AND PELVIS WITH CONTRAST TECHNIQUE:  Multidetector CT imaging of the abdomen and pelvis was performed using the standard protocol following bolus administration of intravenous contrast. CONTRAST:  167m OMNIPAQUE IOHEXOL 300 MG/ML  SOLN COMPARISON:  01/09/2018 and 09/07/2014 FINDINGS: Lower chest: Examination demonstrates moderate size bilateral pleural effusions with associated compressive atelectasis in the lung bases. Mild cardiomegaly. Hepatobiliary: Mild diffuse low-attenuation of the liver without focal mass. Gallbladder and biliary tree are normal. Pancreas: Normal. Spleen: Normal. Adrenals/Urinary Tract: Adrenal glands are normal. Kidneys are normal in size with patchy bilateral renal cortical scarring. Few small subcentimeter bilateral cortical hypodensities too small to characterize but likely cysts. No hydronephrosis or nephrolithiasis. Bladder and ureters are normal. Stomach/Bowel: Stomach is within normal. Mild wall thickening of several small bowel loops over the left lower quadrant which may be due to regional enteritis of infectious or inflammatory nature. No bowel obstruction. Appendix is normal. Mild decompression of the transverse and descending colon as the colon is otherwise unremarkable. Vascular/Lymphatic: Normal. Reproductive: Normal. Other: No free fluid or focal inflammatory change. Musculoskeletal: Unremarkable. IMPRESSION: Few small bowel loops over the left lower quadrant with wall thickening which may be due to a regional enteritis of infectious or inflammatory nature. No bowel obstruction. Moderate size bilateral pleural effusions with bibasilar atelectasis. Bilateral renal cortical scarring. Couple small subcentimeter bilateral renal cortical hypodensities too small to characterize but likely cysts. Attic steatosis. Mild cardiomegaly. Electronically Signed   By: DMarin OlpM.D.   On: 08/09/2018 16:46   Dg Abd Acute W/chest  Result Date: 08/10/2018 CLINICAL DATA:  Abdomen pain with nausea and vomiting EXAM: DG  ABDOMEN ACUTE W/ 1V CHEST COMPARISON:  CT 08/09/2018, radiograph 08/09/2018 FINDINGS: Single-view chest demonstrates cardiomegaly with vascular congestion, bilateral pleural effusion, and bibasilar consolidations. Increased peripheral opacity in the right mid lung. Supine and upright views of the abdomen demonstrate no free air beneath the diaphragm. Paucity of abdominal bowel gas which is nonspecific. Residual contrast within the urinary bladder and renal collecting systems. IMPRESSION: 1. Small moderate bilateral pleural effusions. Cardiomegaly with vascular congestion and mild interstitial edema. Bibasilar atelectasis or pneumonia. Increasing peripheral infiltrate in the right mid lung. 2. Nonspecific diffuse decreased bowel gas. Electronically Signed   By: KMaudie Mercury  Francoise Ceo M.D.   On: 08/10/2018 01:54        Scheduled Meds: . [START ON 08/12/2018] Influenza vac split quadrivalent PF  0.5 mL Intramuscular Tomorrow-1000  . insulin aspart  0-9 Units Subcutaneous Q4H  . metoCLOPramide (REGLAN) injection  5 mg Intravenous Q8H  . metoprolol tartrate  5 mg Intravenous Q6H  . warfarin  7.5 mg Oral ONCE-1800  . Warfarin - Pharmacist Dosing Inpatient   Does not apply q1800   Continuous Infusions: . piperacillin-tazobactam (ZOSYN)  IV Stopped (08/11/18 1015)  . potassium chloride 10 mEq (08/11/18 1023)     LOS: 0 days        Eulogio Bear DO [facility]   If 7PM-7AM, please contact night-coverage [email or web address] Password [email or web address] 08/11/2018, 11:13 AM

## 2018-08-11 NOTE — Progress Notes (Addendum)
Pt refusing to take po coreg, " I can't take it right now, feel sick, I'll try it later" she states.   Dr Eliseo Squires in at this time and made her aware.

## 2018-08-11 NOTE — Consult Note (Addendum)
Cardiology Consultation:   Patient ID: Kristen Lozano; [id number]; 1956-04-24   Admit date: 08/09/2018 Date of Consult: 08/11/2018  Primary Care Provider: Audley Hose, MD Primary Cardiologist: Dr. Debara Pickett  Patient Profile:   Kristen Lozano is a 62 y.o. 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 Eliseo Squires.   History of Present Illness:   Kristen Lozano is a 62 year old female with a history stated above who presented to [facility] on 08/09/2018 with persistent nausea and vomiting with lower abdominal pain and cramping without diarrhea for approximately 4 days in duration. She denies fever, chills, apparent food poisoning or recent sick contacts. She states that prior to the last week, she was in her normal state of health. She reports that she had been able to ambulate without complication however was having mild exertional SOB within the last week. Uncertain if this was related to underlying infection versus possible ischemia driving her symptoms. She denies chest pain, LE swelling, orthopnea, recent fatigue unrelated to acute illness, palpitations, syncope or presyncopal episodes. She has significant risk factors for CAD including DM2, HTN, HLD and prior evidence of LV dysfunction on echocardiogram studies without definitive ischemic evaluation secondary to pt and family preference and recent CVA at that time.   Upon arrival to the emergency department, BNP on arrival 1,148.  Troponin negative x1.  Lactic acid mildly elevated with a peak of 2.1. CT of the abdomen and pelvis revealed possible LLQ infectious versus inflammatory changes. The patient was placed on empiric antibiotics with azithromycin and ceftriaxone>> now on Zosyn. CXR on presentation showed evidence of pneumonia with probable bilateral pleural effusions consistent with pulmonary  edema. Repeat imaging from 08/10/2018 revealed small moderate bilateral pleural effusions with cardiomegaly and vascular congestion and mild interstitial edema.  There is evidence of bibasilar atelectasis or pneumonia with increasing peripheral infiltrate in the right mid lung. Cardiology was asked to follow given echocardiogram results revealing further decrease in LV function, now down to 20-25%.   Of note, patient was last seen in follow-up on 12/16/2016 per primary cardiologist.  She had previously been admitted with left brain CVA in which an echocardiogram was performed and demonstrated LVEF of approximately 35%.  Work-up included a nuclear stress test which demonstrated anterior septal and inferior scar with small area of distal anterior ischemia.  Cardiac catheterization at that time was felt to be contraindicated in the setting of recent CVA along with patient and family preference.  When followed on 12/16/2016 she again had a recent hospitalization in which there was a concern for LV thrombus found per echocardiogram.  She was started on Coumadin and it was noted that her LVEF improved from 30 to 35% initially to 40 to 45%.  She was noted to be on carvedilol and lisinopril, as well as ASA and Coumadin.  Past Medical History:  Diagnosis Date  . Allergic rhinitis    Requires cetirizine, singulair, and fluticasone.  . Anxiety    Has been on Xanax since 2009. Uses it for stress, anxiety, and insomnia. No contract yet.  . Arthritis   . CHF (congestive heart failure) (HCC)    EF 35% after stroke, presumed ischemic  . Chronic pain    Has OA of knees B. No Xrays in echart. Not requiring narcotics.  . Colitis 12/2017  . Depression   .  Diabetes mellitus    Type 2, non insulin dependent. Was dx'd prior to 2008.  Marland Kitchen Hyperglycemia   . Hyperlipemia   . Hypertension   . Sickle cell trait ([place])   . Stroke [facility]) 09/05/14   Dominant left MCA infarcts secondary to unknown embolic source     Past  Surgical History:  Procedure Laterality Date  . TEE WITHOUT CARDIOVERSION N/A 09/06/2014   Procedure: TRANSESOPHAGEAL ECHOCARDIOGRAM (TEE);  Surgeon: Pixie Casino, MD;  Location: [facility] ENDOSCOPY;  Service: Cardiovascular;  Laterality: N/A;     Prior to Admission medications   Medication Sig Start Date End Date Taking? Authorizing Provider  Ascorbic Acid (VITAMIN C GUMMIE PO) Take 250 mg by mouth daily.   Yes [provider]  ASPIRIN LOW DOSE 81 MG EC tablet Take 1 tablet (81 mg total) by mouth daily. Patient taking differently: Take 81 mg by mouth daily.  01/22/18  Yes Hilty, Nadean Corwin, MD  atorvastatin (LIPITOR) 20 MG tablet Take 1 tablet (20 mg total) by mouth daily at 6 PM. Please schedule appointment for refills. 01/02/18  Yes Hilty, Nadean Corwin, MD  b complex vitamins tablet Take 1 tablet by mouth daily.   Yes [provider]  carvedilol (COREG) 25 MG tablet Take 1 tablet (25 mg total) by mouth 2 (two) times daily with a meal. Please schedule appointment for refills. 01/02/18  Yes Hilty, Nadean Corwin, MD  Cholecalciferol (VITAMIN D3 ADULT GUMMIES PO) Take 2,000 Units by mouth daily.   Yes [provider]  Cyanocobalamin (VITAMIN B-12) 5000 MCG SUBL Place 5,000 mcg under the tongue daily.   Yes [provider]  Dulaglutide (TRULICITY) 3.24 MW/1.0UV SOPN Inject 0.75 mg into the skin every Saturday.   Yes [provider]  lisinopril (PRINIVIL,ZESTRIL) 10 MG tablet Take 1 tablet (10 mg total) by mouth daily. Please schedule appointment for refills. 07/21/18  Yes Hilty, Nadean Corwin, MD  metFORMIN (GLUCOPHAGE-XR) 500 MG 24 hr tablet Take 500 mg by mouth 2 (two) times daily. 08/06/18  Yes [provider]  Vitamin E 400 units CHEW Chew 400 Units by mouth daily.   Yes [provider]  warfarin (COUMADIN) 5 MG tablet Tale 1.5 Tablets (7.36m)daily OR as directed by coumadin clinic Patient taking differently: Take 5-7.5 mg by mouth See admin  instructions. Take 1 1/2 tablets (7.5 mg) on Monday and Friday evening, take 1 tablet (5 mg) on Sunday, Tuesday, Wednesday, Thursday, Saturday evening or as directed by coumadin clinic 05/29/18  Yes Hilty, KNadean Corwin MD  metFORMIN (GLUCOPHAGE) 1000 MG tablet Take 1 tablet (1,000 mg total) by mouth 2 (two) times daily with a meal. Patient not taking: Reported on 08/09/2018 11/01/16   CDessa Phi DO  ondansetron ([facility] 4 MG/5ML solution Take 5 mLs (4 mg total) by mouth every 8 (eight) hours as needed for nausea or vomiting. Patient not taking: Reported on 08/09/2018 01/15/18   JDomenic Polite MD  promethazine (PHENERGAN) 12.5 MG tablet Take 1 tablet (12.5 mg total) by mouth every 6 (six) hours as needed for nausea or vomiting. Patient not taking: Reported on 08/09/2018 01/09/18   FJacqlyn Larsen PA-C  sitaGLIPtin (JANUVIA) 100 MG tablet Take 1 tablet (100 mg total) by mouth daily. Patient not taking: Reported on 08/09/2018 11/01/16   CDessa Phi DO    Inpatient Medications: Scheduled Meds: . [START ON 08/12/2018] Influenza vac split quadrivalent PF  0.5 mL Intramuscular Tomorrow-1000  . insulin aspart  0-9 Units Subcutaneous Q4H  . metoCLOPramide (  REGLAN) injection  5 mg Intravenous Q8H  . metoprolol tartrate  5 mg Intravenous Q6H  . warfarin  7.5 mg Oral ONCE-1800  . Warfarin - Pharmacist Dosing Inpatient   Does not apply q1800   Continuous Infusions: . sodium chloride    . piperacillin-tazobactam (ZOSYN)  IV Stopped (08/11/18 1015)  . potassium chloride 10 mEq (08/11/18 1023)   PRN Meds: acetaminophen **OR** acetaminophen, ondansetron **OR** ondansetron (ZOFRAN) IV  Allergies:   No Known Allergies  Social History:   Social History   Socioeconomic History  . Marital status: Legally Separated    Spouse name: Not on file  . Number of children: 2  . Years of education: 9 TH  . Highest education level: Not on file  Occupational History  . Occupation: Disabled  Social Needs  .  Financial resource strain: Not on file  . Food insecurity:    Worry: Not on file    Inability: Not on file  . Transportation needs:    Medical: Not on file    Non-medical: Not on file  Tobacco Use  . Smoking status: Never Smoker  . Smokeless tobacco: Never Used  Substance and Sexual Activity  . Alcohol use: No    Alcohol/week: 0.0 standard drinks  . Drug use: No  . Sexual activity: Not on file  Lifestyle  . Physical activity:    Days per week: Not on file    Minutes per session: Not on file  . Stress: Not on file  Relationships  . Social connections:    Talks on phone: Not on file    Gets together: Not on file    Attends religious service: Not on file    Active member of club or organization: Not on file    Attends meetings of clubs or organizations: Not on file    Relationship status: Not on file  . Intimate partner violence:    Fear of current or ex partner: Not on file    Emotionally abused: Not on file    Physically abused: Not on file    Forced sexual activity: Not on file  Other Topics Concern  . Not on file  Social History Narrative   Patient is married with 2 children.   Patient is right handed.   Patient has 9 th grade education.   Patient drinks 2 cups daily.    Family History:   Family History  Problem Relation Age of Onset  . Diabetes Mother   . Hypertension Mother   . Heart disease Father   . Heart attack Father   . Hypertension Father   . Diabetes Father   . Ovarian cancer Sister   . Liver cancer Sister   . Sickle cell anemia Daughter   . Schizophrenia Daughter   . Hypertension Sister   . Hypertension Brother   . Hypertension Daughter   . Kidney disease Sister        x2  . Kidney disease Brother   . Stroke Neg Hx   . Esophageal cancer Neg Hx   . Colon cancer Neg Hx   . Colon polyps Neg Hx    Family Status:  Family Status  Relation Name Status  . Mother  Deceased  . Father  Deceased  . Sister  Deceased  . Sister  Deceased  . Brother   Deceased  . Daughter  (Not Specified)  . Daughter  (Not Specified)  . Sister  (Not Specified)  . Brother  (Not Specified)  .  Daughter  (Not Specified)  . Sister  (Not Specified)  . Brother  (Not Specified)  . Neg Hx  (Not Specified)    ROS:  Please see the history of present illness.  All other ROS reviewed and negative.     Physical Exam/Data:   Vitals:   08/10/18 0421 08/10/18 2032 08/11/18 0414 08/11/18 0923  BP: (!) 142/95 136/85 (!) 154/104 (!) 162/98  Pulse: (!) 113 (!) 123 (!) 121 (!) 104  Resp: 18 18 18 18   Temp: 98.9 F (37.2 C) 98 F (36.7 C) 98.5 F (36.9 C) 99 F (37.2 C)  TempSrc:  Oral Oral Oral  SpO2: 92% 97% 97% 98%  Weight:  49.9 kg    Height:        Intake/Output Summary (Last 24 hours) at 08/11/2018 1123 Last data filed at 08/11/2018 0924 Gross per 24 hour  Intake 364.85 ml  Output 0 ml  Net 364.85 ml   Filed Weights   08/09/18 2130 08/10/18 2032  Weight: 49.9 kg 49.9 kg   Body mass index is 19.48 kg/m.   General: Frail, older than stated age,  NAD Skin: Warm, dry, intact  Head: Normocephalic, atraumatic,  clear, moist mucus membranes. Neck: Negative for carotid bruits. No JVD Lungs:Clear to ausculation bilaterally. No wheezes, rales, or rhonchi. Breathing is unlabored. Cardiovascular: RRR with S1 S2. No murmurs, rubs, gallops, or LV heave appreciated. Abdomen: Soft, non-tender, non-distended with normoactive bowel sounds. No obvious abdominal masses. MSK: Strength and tone appear normal for age. 5/5 in all extremities Extremities: No edema. No clubbing or cyanosis. DP/PT pulses 2+ bilaterally Neuro: Alert and oriented. No focal deficits. No facial asymmetry. MAE spontaneously. Psych: Responds to questions appropriately with normal affect.     EKG:  The EKG was personally reviewed and demonstrates: 08/10/2018 sinus tachycardia with prolonged QTC at 558 but no acute ischemic changes Telemetry:  Telemetry was personally reviewed and  demonstrates: 08/11/18 NSR/ST   Relevant CV Studies:  Echocardiogram: 08/10/2018: Study Conclusions  - Left ventricle: The cavity size was normal. Wall thickness was   normal. Systolic function was severely reduced. The estimated   ejection fraction was in the range of 20% to 25%. Diffuse   hypokinesis. The study is not technically sufficient to allow   evaluation of LV diastolic function. - Ventricular septum: Septal motion showed abnormal function and   dyssynergy. - Aortic valve: There was trivial regurgitation. - Mitral valve: There was moderate regurgitation. - Left atrium: The atrium was mildly dilated. - Tricuspid valve: There was trivial regurgitation. - Pulmonary arteries: Systolic pressure was moderately increased.   PA peak pressure: 53 mm Hg (S). - Pericardium, extracardiac: A trivial pericardial effusion was   identified. There was a left pleural effusion.  Impressions:  - EF is reduced when compared to prior study (40%)  Echocardiogram 10/30/2016: Study Conclusions  - Left ventricle: Systolic function was mildly to moderately   reduced. The estimated ejection fraction was in the range of 40%   to 45%. Akinesis of the apicalanteroseptal myocardium. Features   are consistent with a pseudonormal left ventricular filling   pattern, with concomitant abnormal relaxation and increased   filling pressure (grade 2 diastolic dysfunction). There was a   large, apicalthrombus. - Mitral valve: There was mild regurgitation.  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  Echocardiogram 09/06/2014: Study Conclusions  - Left ventricle: There was mild concentric  hypertrophy. Dilated ventricle. Severe global hypokinesis with inferior akinesis - LVEF 30-35%. - Aortic valve: No evidence of vegetation. - Mitral valve: Structurally normal valve. No evidence of vegetation. There was mild  regurgitation. - Left atrium: The atrium was dilated. No evidence of thrombus in the atrial cavity or appendage. - Right atrium: No evidence of thrombus in the atrial cavity or appendage. - Atrial septum: No defect or patent foramen ovale was identified. - Tricuspid valve: No evidence of vegetation.  Impressions:  - No cardiac source of emboli was indentified.  CATH: None  Laboratory Data:  Chemistry Recent Labs  Lab 08/09/18 1321 08/10/18 0211 08/11/18 0636  NA 139 142 141  K 3.8 3.6 2.7*  CL 107 107 100  CO2 22 25 24   GLUCOSE 183* 172* 159*  BUN 12 12 12   CREATININE 0.82 0.87 0.90  CALCIUM 9.1 9.1 9.3  GFRNONAA >60 >60 >60  GFRAA >60 >60 >60  ANIONGAP 10 10 17*    Total Protein  Date Value Ref Range Status  08/10/2018 7.0 6.5 - 8.1 g/dL Final   Albumin  Date Value Ref Range Status  08/10/2018 3.6 3.5 - 5.0 g/dL Final   AST  Date Value Ref Range Status  08/10/2018 33 15 - 41 U/L Final   ALT  Date Value Ref Range Status  08/10/2018 39 0 - 44 U/L Final   Alkaline Phosphatase  Date Value Ref Range Status  08/10/2018 40 38 - 126 U/L Final   Total Bilirubin  Date Value Ref Range Status  08/10/2018 1.1 0.3 - 1.2 mg/dL Final   Hematology Recent Labs  Lab 08/09/18 1321 08/10/18 0211 08/11/18 0636  WBC 6.9 7.6 11.9*  RBC 5.97* 5.81* 5.94*  HGB 12.9 12.5 13.0  HCT 42.0 40.3 40.2  MCV 70.4* 69.4* 67.7*  MCH 21.6* 21.5* 21.9*  MCHC 30.7 31.0 32.3  RDW 15.0 14.8 14.4  PLT 291 261 279   Cardiac Enzymes Recent Labs  Lab 08/09/18 1643  TROPONINI <0.03   No results for input(s): TROPIPOC in the last 168 hours.  BNP Recent Labs  Lab 08/09/18 1643  BNP 1,148.3*    DDimer No results for input(s): DDIMER in the last 168 hours. TSH:  Lab Results  Component Value Date   TSH 3.520 09/04/2014   Lipids: Lab Results  Component Value Date   CHOL 146 10/30/2016   HDL 44 10/30/2016   LDLCALC 81 10/30/2016   TRIG 104 10/30/2016   CHOLHDL 3.3  10/30/2016   HgbA1c: Lab Results  Component Value Date   HGBA1C 7.7 (H) 01/11/2018    Radiology/Studies:  Dg Chest 2 View  Result Date: 08/09/2018 CLINICAL DATA:  Generalized abdominal pain. Nausea, vomiting and diarrhea. EXAM: CHEST - 2 VIEW COMPARISON:  Chest x-ray dated 09/04/2014. FINDINGS: Cardiomegaly, partially obscured by dense bibasilar opacities. Upper lungs are relatively clear. Osseous structures about the chest are unremarkable. IMPRESSION: 1. Dense bibasilar opacities, suspicious for bilateral pneumonia, less likely asymmetric pulmonary edema. 2. Probable bilateral pleural effusions, small to moderate in size. 3. Probable cardiomegaly, difficult to definitively characterize due to obscuration by the bibasilar opacities. Electronically Signed   By: Franki Cabot M.D.   On: 08/09/2018 14:11   Ct Abdomen Pelvis W Contrast  Result Date: 08/09/2018 CLINICAL DATA:  Abdominal pain with nausea, vomiting and diarrhea 3 days. EXAM: CT ABDOMEN AND PELVIS WITH CONTRAST TECHNIQUE: Multidetector CT imaging of the abdomen and pelvis was performed using the standard protocol following bolus administration of intravenous contrast.  CONTRAST:  145m OMNIPAQUE IOHEXOL 300 MG/ML  SOLN COMPARISON:  01/09/2018 and 09/07/2014 FINDINGS: Lower chest: Examination demonstrates moderate size bilateral pleural effusions with associated compressive atelectasis in the lung bases. Mild cardiomegaly. Hepatobiliary: Mild diffuse low-attenuation of the liver without focal mass. Gallbladder and biliary tree are normal. Pancreas: Normal. Spleen: Normal. Adrenals/Urinary Tract: Adrenal glands are normal. Kidneys are normal in size with patchy bilateral renal cortical scarring. Few small subcentimeter bilateral cortical hypodensities too small to characterize but likely cysts. No hydronephrosis or nephrolithiasis. Bladder and ureters are normal. Stomach/Bowel: Stomach is within normal. Mild wall thickening of several small  bowel loops over the left lower quadrant which may be due to regional enteritis of infectious or inflammatory nature. No bowel obstruction. Appendix is normal. Mild decompression of the transverse and descending colon as the colon is otherwise unremarkable. Vascular/Lymphatic: Normal. Reproductive: Normal. Other: No free fluid or focal inflammatory change. Musculoskeletal: Unremarkable. IMPRESSION: Few small bowel loops over the left lower quadrant with wall thickening which may be due to a regional enteritis of infectious or inflammatory nature. No bowel obstruction. Moderate size bilateral pleural effusions with bibasilar atelectasis. Bilateral renal cortical scarring. Couple small subcentimeter bilateral renal cortical hypodensities too small to characterize but likely cysts. Attic steatosis. Mild cardiomegaly. Electronically Signed   By: DMarin OlpM.D.   On: 08/09/2018 16:46   Dg Abd Acute W/chest  Result Date: 08/10/2018 CLINICAL DATA:  Abdomen pain with nausea and vomiting EXAM: DG ABDOMEN ACUTE W/ 1V CHEST COMPARISON:  CT 08/09/2018, radiograph 08/09/2018 FINDINGS: Single-view chest demonstrates cardiomegaly with vascular congestion, bilateral pleural effusion, and bibasilar consolidations. Increased peripheral opacity in the right mid lung. Supine and upright views of the abdomen demonstrate no free air beneath the diaphragm. Paucity of abdominal bowel gas which is nonspecific. Residual contrast within the urinary bladder and renal collecting systems. IMPRESSION: 1. Small moderate bilateral pleural effusions. Cardiomegaly with vascular congestion and mild interstitial edema. Bibasilar atelectasis or pneumonia. Increasing peripheral infiltrate in the right mid lung. 2. Nonspecific diffuse decreased bowel gas. Electronically Signed   By: KDonavan FoilM.D.   On: 08/10/2018 01:54   Assessment and Plan:   1.  Acute on chronic systolic diastolic heart failure: -Patient presented with persistent  nausea and vomiting with abdominal pain for approximately 4 days in duration. Given significantly elevated BNP (>1000) and chest imaging on arrival, an echocardiogram was performed which showed further decrease in LV function, now down to 20 to 25% with diffuse hypokinesis, moderate mitral regurgitation, PA peak pressure 53 mmHg and trivial pericardial effusion.  Last echocardiogram 10/30/2014 with LVEF 40 to 45%.  Patient had a nuclear stress test 08/2014 which was abnormal, showing anterior septal and inferior wall scarring with potential inducible ischemia and small region of the distal anterior wall with global hypokinesis and near akinesis of the septum.  At that time, patient family opted not to pursue further definitive ischemic evaluation with cardiac catheterization secondary to recent CVA -BNP on admission, 1100 -Chest CXR and upper abdominal and chest imaging consistent with CHF exacerbation with bilateral pleural effusions  -Weight, 109lb today, 110lb on admission -I&O, net +656 mL since admission -Will start IV Lasix 457mtwice daily and monitor her response>>> creatinine stable 0.90 today -Could consider Entresto given stable BP and renal function with the addition of Spironolactone>>will discuss with MD  -If Entresto, will need to start tomorrow 08/12/18 given unknown time of last ACE-I  -Will consider possible OP ischemic evaluation with coronary CTA once infection  clears and she is more stabilized.   2.  History of LV thrombus: -Patient with a history of LV thrombus and probable embolic stroke, placed on Coumadin.  -Patient reports noncompliance with Coumadin secondary to severe nausea and vomiting for the last several days -INR upon presentation, 2.37 -Coumadin dosing per pharmacy  3.  Nausea and vomiting: -Stable at the knee, per primary team -Continue IV antiemetics, Reglan -Found to have hypokalemia likely in the setting of acute volume loss secondary to AV  4.   Pneumonia: -Currently treated with IV Zosyn -Blood cultures pending, following lactic acid trend -Per primary team  5.  Hypertension:  -Elevated, 162/98, 134/104, 136/85 -Patient receiving IV metoprolol 5 mg every 6 hours -Will add home dose lisinopril 10 mg -Could consider Entresto given stable BP and renal function with the addition of Spironolactone>>will discuss with MD    6.  Hyperlipidemia: -Stable, last LDL 81 on 10/30/2016 -Will recheck lipid panel   7.  DM2: -Uncontrolled, last hemoglobin A1c 12.5 -SSI for glucose control while inpatient status -Per primary team  8. Hypokalemia: -K+, 2.7 today -Will replaced with 30mq IV per primary team -Will add daily 453m given the addition of Lasix  -Follow with daily BMET   For questions or updates, please contact [facility] consult [email or web address] for contact info under Cardiology/STEMI.   Signed, Kathyrn DrownP-C [facility] Pager: [phone]/17/2019 11:23 AM   I have personally seen and examined this patient with JiKathyrn DrownNP-C. I agree with the assessment and plan as outlined above.  Ms. DaFoulkess known to have a cardiomyopathy since 2015. She has had an abnormal stress test in 2017 but she and her family refused a cardiac cath due to risk of stroke. She has been on coumadin for LV thrombus. She has no prior issues with volume overload.  She is now admitted with N/V/diarrhea and felt to have an acute abdominal infection with possible pneumonia. She endorses some dyspnea but no chest pain.  CT abd/pelvis with limited views of the lower lungs shows moderate sized bilateral pleural effusions.  BNP is elevated. Creatinine is normal.   My exam:   General: Well developed, well nourished, NAD  HEENT: OP clear, mucus membranes moist  SKIN: warm, dry. No rashes. Neuro: No focal deficits  Musculoskeletal: Muscle strength 5/5 all ext  Psychiatric: Mood and affect normal  Neck: No JVD, no carotid bruits, no  thyromegaly, no lymphadenopathy.  Lungs:Bibasilar crackles.  Cardiovascular: Regular rate and rhythm. No murmurs, gallops or rubs. Abdomen:Soft. Bowel sounds present. Non-tender.  Extremities: No lower extremity edema. Pulses are 2 + in the bilateral DP/PT.  1. Cardiomyopathy-unknown if this ischemic in etiology. Her cardiomyopathy is not new. LV systolic is read as slightly lower on echo here. No evidence of acute coronary syndrome. I would consider an ischemic evaluation with cath vs coronary CTA to exclude CAD but this can be done as an outpatient.  2. Acute systolic CHF: She has bilateral pleural effusions on CT and elevated BNP with c/o dyspnea with exertion. No peripheral edema on exam. I would start Lasix iV 40 mg BID. Hopefully some volume removal will help with her dyspnea.   We will follow with you.  ChLauree Chandler/17/2019 1:29 PM

## 2018-08-12 DIAGNOSIS — J90 Pleural effusion, not elsewhere classified: Secondary | ICD-10-CM

## 2018-08-12 DIAGNOSIS — I5023 Acute on chronic systolic (congestive) heart failure: Secondary | ICD-10-CM

## 2018-08-12 DIAGNOSIS — K529 Noninfective gastroenteritis and colitis, unspecified: Secondary | ICD-10-CM

## 2018-08-12 LAB — CBC
HEMATOCRIT: 44.5 % (ref 36.0–46.0)
HEMOGLOBIN: 14.2 g/dL (ref 12.0–15.0)
MCH: 21.5 pg — AB (ref 26.0–34.0)
MCHC: 31.9 g/dL (ref 30.0–36.0)
MCV: 67.5 fL — AB (ref 78.0–100.0)
Platelets: 268 10*3/uL (ref 150–400)
RBC: 6.59 MIL/uL — ABNORMAL HIGH (ref 3.87–5.11)
RDW: 15.3 % (ref 11.5–15.5)
WBC: 9.8 10*3/uL (ref 4.0–10.5)

## 2018-08-12 LAB — GLUCOSE, CAPILLARY
GLUCOSE-CAPILLARY: 123 mg/dL — AB (ref 70–99)
GLUCOSE-CAPILLARY: 132 mg/dL — AB (ref 70–99)
GLUCOSE-CAPILLARY: 160 mg/dL — AB (ref 70–99)
Glucose-Capillary: 138 mg/dL — ABNORMAL HIGH (ref 70–99)
Glucose-Capillary: 185 mg/dL — ABNORMAL HIGH (ref 70–99)

## 2018-08-12 LAB — BASIC METABOLIC PANEL
Anion gap: 14 (ref 5–15)
BUN: 9 mg/dL (ref 8–23)
CHLORIDE: 99 mmol/L (ref 98–111)
CO2: 26 mmol/L (ref 22–32)
Calcium: 8.7 mg/dL — ABNORMAL LOW (ref 8.9–10.3)
Creatinine, Ser: 0.81 mg/dL (ref 0.44–1.00)
GFR calc Af Amer: >60 mL/min (ref >60)
GFR calc non Af Amer: >60 mL/min (ref >60)
Glucose, Bld: 135 mg/dL — ABNORMAL HIGH (ref 70–99)
POTASSIUM: 3.1 mmol/L — AB (ref 3.5–5.1)
SODIUM: 139 mmol/L (ref 135–145)

## 2018-08-12 LAB — PROTIME-INR
INR: 2.63
Prothrombin Time: 27.9 seconds — ABNORMAL HIGH (ref 11.4–15.2)

## 2018-08-12 LAB — LEGIONELLA PNEUMOPHILA SEROGP 1 UR AG: L. pneumophila Serogp 1 Ur Ag: NEGATIVE

## 2018-08-12 MED ORDER — FUROSEMIDE 10 MG/ML IJ SOLN
40.0000 mg | Freq: Every day | INTRAMUSCULAR | Status: DC
  Administered 2018-08-13: 40 mg via INTRAVENOUS
  Filled 2018-08-12: qty 4

## 2018-08-12 MED ORDER — METOPROLOL TARTRATE 5 MG/5ML IV SOLN
7.5000 mg | Freq: Four times a day (QID) | INTRAVENOUS | Status: DC
  Administered 2018-08-12 (×3): 7.5 mg via INTRAVENOUS
  Filled 2018-08-12 (×3): qty 10

## 2018-08-12 MED ORDER — POTASSIUM CHLORIDE 10 MEQ/100ML IV SOLN
10.0000 meq | INTRAVENOUS | Status: AC
  Administered 2018-08-12: 10 meq via INTRAVENOUS
  Filled 2018-08-12: qty 100

## 2018-08-12 MED ORDER — BOOST / RESOURCE BREEZE PO LIQD CUSTOM
1.0000 | Freq: Three times a day (TID) | ORAL | Status: DC
  Administered 2018-08-12 (×2): 1 via ORAL
  Filled 2018-08-12 (×4): qty 1

## 2018-08-12 MED ORDER — HYDROMORPHONE HCL 1 MG/ML IJ SOLN
1.0000 mg | Freq: Once | INTRAMUSCULAR | Status: AC
  Administered 2018-08-12: 1 mg via INTRAVENOUS
  Filled 2018-08-12: qty 1

## 2018-08-12 MED ORDER — POTASSIUM CHLORIDE 10 MEQ/100ML IV SOLN
10.0000 meq | INTRAVENOUS | Status: AC
  Administered 2018-08-12 (×2): 10 meq via INTRAVENOUS
  Filled 2018-08-12 (×2): qty 100

## 2018-08-12 MED ORDER — WARFARIN SODIUM 2.5 MG PO TABS
2.5000 mg | ORAL_TABLET | Freq: Once | ORAL | Status: AC
  Administered 2018-08-12: 2.5 mg via ORAL
  Filled 2018-08-12: qty 1

## 2018-08-12 NOTE — Progress Notes (Signed)
PROGRESS NOTE    Kristen Lozano  BTD:[id number] DOB: October 11, 1956 DOA: 08/09/2018 PCP: Audley Hose, MD Brief Narrative:62 year old woman with a history of LV thrombus and CVA, on anticoagulation, history of hypertension, diabetes type 2, presenting to the ER with persistent nausea and vomiting over the last 4 days,accompanied by lower abdominal crampy pain, without diarrheaand non productive. No apparent food poisoning, sickrecent contacts or recent travel. She was not on antibiotics.without fever or chills. CT of the abdomen and pelvis was remarkable for possible LLQinfectious versus inflammatory changes (similar to prior hospitalization in 12/2017). 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  Continue Zosyn IV per pharmacyfor intra-abdominal infection, day 3 Reglan as she did not respond to zofran Advance diet as tolered  Pneumonia, likely due to aspiration.Patient received ceftriaxone and Zithromax in the ER, now the patient is on Zosyn day 3 WBC  11.9   BCX neg to date  Urine culture pending Tmx 99  Received Flu vaccine    Hypertension BP  Continue Lopressor IV as NOT able to take PO coreg   Hyperlipidemia Continue homeLipitor  Hypokalemia replete K  Chronic systolic and diastolic heart failure, BNP on admission was 1100,  butnot hypoxic. She has not been taking Coreg due to vomiting over the last 4 days prior to admission.  New 2 D echo shows EF 20-25% (prior was 45) with diffuse wall motion abnormalities Close monitoring of I and O's, and weight, currently at 49.9 kg Cardiology consult is appreciated, will follow recommendations, meds   History of diabetesuncontrolled.A1C 12.5, similar to prior  Januvia and Glucophage were held Sliding scale insulin  History of LV thrombus, history of atrial fibrillation.on Coumadin. She had not taken this medicine due to vomiting over 4 days prior to admission. She can be on heparin when INR <2, if not able to take Coumadin     DVT prophylaxis coumadin Code Status: full Family Communicationnone Disposition Plan: TBD   Consultants:  Cardiology Procedures: Echocardiogram Antimicrobials: Zosyn day 3  Subjective: Reports that she did not have any vomiting or diarrhea abdominal pain is better however she reported that she has difficulty swallowing.  Objective: Vitals:   08/11/18 2014 08/11/18 2319 08/12/18 0419 08/12/18 0734  BP: 118/82 122/84 (!) 144/98 (!) 150/96  Pulse: (!) 118 (!) 123 (!) 119 (!) 116  Resp: 18  18 18   Temp: 98 F (36.7 C)  98.7 F (37.1 C) 99.1 F (37.3 C)  TempSrc: Oral   Oral  SpO2: 99% 100% 100% 98%  Weight:      Height:        Intake/Output Summary (Last 24 hours) at 08/12/2018 1253 Last data filed at 08/12/2018 1100 Gross per 24 hour  Intake 180 ml  Output 2870 ml  Net -2690 ml   Filed Weights   08/09/18 2130 08/10/18 2032  Weight: 49.9 kg 49.9 kg    Examination:  General exam: Appears calm and comfortable  Respiratory system: Decreased breath sounds at the bases auscultation. Respiratory effort normal. Cardiovascular system: S1 & S2 heard, RRR. No JVD, murmurs, rubs, gallops or clicks. No pedal edema. Gastrointestinal system: Abdomen is nondistended, soft and  Mild tender. No organomegaly or masses felt.  Normal bowel sounds heard. Central nervous system: Alert and oriented. No focal neurological deficits. Extremities: Symmetric 5 x 5 power. Skin: No rashes, lesions or ulcers Psychiatry: Judgement and insight appear normal. Mood & affect appropriate.     Data Reviewed: I have personally reviewed following labs and imaging studies  CBC: Recent Labs  Lab 08/09/18 1321 08/10/18 0211 08/11/18 0636 08/12/18 0637  WBC 6.9 7.6 11.9* 9.8  NEUTROABS  --  6.4  --   --   HGB 12.9 12.5 13.0 14.2  HCT 42.0 40.3 40.2 44.5  MCV 70.4* 69.4* 67.7* 67.5*  PLT 291 261 279 258   Basic Metabolic Panel: Recent Labs  Lab 08/09/18 1321 08/10/18 0211 08/11/18 0636 08/12/18 0637  NA 139 142 141 139  K 3.8 3.6 2.7* 3.1*  CL 107 107 100 99  CO2 22 25 24 26   GLUCOSE 183* 172* 159* 135*  BUN 12 12 12 9   CREATININE 0.82 0.87 0.90 0.81  CALCIUM 9.1 9.1 9.3 8.7*  MG  --   --  1.7  --    GFR: Estimated Creatinine Clearance: 56 mL/min (by C-G formula based on SCr of 0.81 mg/dL). Liver Function Tests: Recent Labs  Lab 08/09/18 1321 08/10/18 0211  AST 29 33  ALT 32 39  ALKPHOS 41 40  BILITOT 1.1 1.1  PROT 6.9 7.0  ALBUMIN 3.8 3.6   Recent Labs  Lab 08/09/18 1321  LIPASE 30   No results for input(s): AMMONIA in the last 168 hours. Coagulation Profile: Recent Labs  Lab 08/09/18 1643 08/10/18 0211 08/11/18 0636 08/12/18 0637  INR 2.37 2.30 2.71 2.63   Cardiac Enzymes: Recent Labs  Lab 08/09/18 1643  TROPONINI <0.03   BNP (last 3 results) No results for input(s): PROBNP in the last 8760 hours. HbA1C: No results for input(s): HGBA1C in the last 72 hours. CBG: Recent Labs  Lab 08/11/18 2014 08/11/18 2313 08/12/18 0347 08/12/18 0734 08/12/18 1110  GLUCAP 185* 132* 123* 132* 138*   Lipid Profile: No results for input(s): CHOL, HDL, LDLCALC, TRIG, CHOLHDL, LDLDIRECT in the last 72 hours. Thyroid Function Tests: No results for input(s): TSH, T4TOTAL, FREET4, T3FREE,  THYROIDAB in the last 72 hours. Anemia Panel: No results for input(s): VITAMINB12, FOLATE, FERRITIN, TIBC, IRON, RETICCTPCT in the last 72 hours. Sepsis Labs: Recent Labs  Lab 08/10/18 0211 08/10/18 0532 08/10/18 1338 08/11/18 1142  LATICACIDVEN 2.0* 2.1* 2.0* 1.8    Recent Results (from the past 240 hour(s))  Blood culture (routine x 2)     Status: None (Preliminary result)   Collection Time: 08/09/18  4:42 PM  Result Value Ref Range Status   Specimen Description BLOOD LEFT FOREARM  Final   Special Requests   Final    BOTTLES DRAWN AEROBIC ONLY Blood Culture results may not be optimal due to an excessive volume of blood received in culture bottles   Culture   Final    NO GROWTH 3 DAYS Performed at [facility], [place] [address] [place], [place] [address]    Report Status PENDING  Incomplete  Blood culture (routine x 2)  Status: None (Preliminary result)   Collection Time: 08/09/18  4:44 PM  Result Value Ref Range Status   Specimen Description BLOOD RIGHT ANTECUBITAL  Final   Special Requests   Final    BOTTLES DRAWN AEROBIC AND ANAEROBIC Blood Culture adequate volume   Culture   Final    NO GROWTH 3 DAYS Performed at [facility], [address], [place], [place] [address]    Report Status PENDING  Incomplete         Radiology Studies: No results found.      Scheduled Meds: . [START ON 08/13/2018] furosemide  40 mg Intravenous Daily  . insulin aspart  0-9 Units Subcutaneous Q4H  . metoCLOPramide (REGLAN) injection  5 mg Intravenous Q8H  . metoprolol tartrate  7.5 mg Intravenous Q6H  . potassium chloride  40 mEq Oral Daily  . warfarin  2.5 mg Oral ONCE-1800  . Warfarin - Pharmacist Dosing Inpatient   Does not apply q1800   Continuous Infusions: . piperacillin-tazobactam (ZOSYN)  IV 3.375 g (08/12/18 0602)     LOS: 1 day     Georgette Shell, MD [facility]  If 7PM-7AM, please contact night-coverage [email or web address] Password  [facility] 08/12/2018, 12:53 PM

## 2018-08-12 NOTE — Progress Notes (Addendum)
Progress Note  Patient Name: Kristen Lozano Date of Encounter: 08/12/2018  Primary Cardiologist: Pixie Casino, MD   Subjective   Breathing improving. No chest pain. Nausea and vomiting improving.   Inpatient Medications    Scheduled Meds: . furosemide  40 mg Intravenous BID  . insulin aspart  0-9 Units Subcutaneous Q4H  . metoCLOPramide (REGLAN) injection  5 mg Intravenous Q8H  . metoprolol tartrate  5 mg Intravenous Q6H  . potassium chloride  40 mEq Oral Daily  . warfarin  2.5 mg Oral ONCE-1800  . Warfarin - Pharmacist Dosing Inpatient   Does not apply q1800   Continuous Infusions: . piperacillin-tazobactam (ZOSYN)  IV 3.375 g (08/12/18 0602)   PRN Meds: acetaminophen **OR** acetaminophen, ondansetron **OR** ondansetron (ZOFRAN) IV   Vital Signs    Vitals:   08/11/18 2014 08/11/18 2319 08/12/18 0419 08/12/18 0734  BP: 118/82 122/84 (!) 144/98 (!) 150/96  Pulse: (!) 118 (!) 123 (!) 119 (!) 116  Resp: 18  18 18   Temp: 98 F (36.7 C)  98.7 F (37.1 C) 99.1 F (37.3 C)  TempSrc: Oral   Oral  SpO2: 99% 100% 100% 98%  Weight:      Height:        Intake/Output Summary (Last 24 hours) at 08/12/2018 0923 Last data filed at 08/12/2018 0856 Gross per 24 hour  Intake 240 ml  Output 2210 ml  Net -1970 ml   Filed Weights   08/09/18 2130 08/10/18 2032  Weight: 49.9 kg 49.9 kg    Telemetry    Sinus tachycardia at 120-130s- Personally Reviewed  ECG    N/A  Physical Exam   GEN: Thin frail female in NAD Neck: No JVD Cardiac: RRR, no murmurs, rubs, or gallops.  Respiratory: Clear to auscultation bilaterally. GI: Soft, nontender, non-distended  MS: No edema; No deformity. Neuro:  Nonfocal  Psych: Normal affect   Labs    Chemistry Recent Labs  Lab 08/09/18 1321 08/10/18 0211 08/11/18 0636 08/12/18 0637  NA 139 142 141 139  K 3.8 3.6 2.7* 3.1*  CL 107 107 100 99  CO2 22 25 24 26   GLUCOSE 183* 172* 159* 135*  BUN 12 12 12 9   CREATININE 0.82 0.87  0.90 0.81  CALCIUM 9.1 9.1 9.3 8.7*  PROT 6.9 7.0  --   --   ALBUMIN 3.8 3.6  --   --   AST 29 33  --   --   ALT 32 39  --   --   ALKPHOS 41 40  --   --   BILITOT 1.1 1.1  --   --   GFRNONAA >60 >60 >60 >60  GFRAA >60 >60 >60 >60  ANIONGAP 10 10 17* 14     Hematology Recent Labs  Lab 08/10/18 0211 08/11/18 0636 08/12/18 0637  WBC 7.6 11.9* 9.8  RBC 5.81* 5.94* 6.59*  HGB 12.5 13.0 14.2  HCT 40.3 40.2 44.5  MCV 69.4* 67.7* 67.5*  MCH 21.5* 21.9* 21.5*  MCHC 31.0 32.3 31.9  RDW 14.8 14.4 15.3  PLT 261 279 268    Cardiac Enzymes Recent Labs  Lab 08/09/18 1643  TROPONINI <0.03   BNP Recent Labs  Lab 08/09/18 1643  BNP 1,148.3*   Radiology    No results found.  Cardiac Studies   Echo 08/10/18 Study Conclusions  - Left ventricle: The cavity size was normal. Wall thickness was   normal. Systolic function was severely reduced. The estimated  ejection fraction was in the range of 20% to 25%. Diffuse   hypokinesis. The study is not technically sufficient to allow   evaluation of LV diastolic function. - Ventricular septum: Septal motion showed abnormal function and   dyssynergy. - Aortic valve: There was trivial regurgitation. - Mitral valve: There was moderate regurgitation. - Left atrium: The atrium was mildly dilated. - Tricuspid valve: There was trivial regurgitation. - Pulmonary arteries: Systolic pressure was moderately increased.   PA peak pressure: 53 mm Hg (S). - Pericardium, extracardiac: A trivial pericardial effusion was   identified. There was a left pleural effusion.  Impressions:  - EF is reduced when compared to prior study (40%)  Patient Profile     [name] is a 62 y.o. female with a hx of CVA, HTN, DM, LV thrombus on chronic Coumadin, hypertension, chronic systolic CHF,  DM 2 presented 9/15 with persistent nausea and vomiting over the last 4 days,accompanied by lower abdominal crampy pain, without diarrheaand non productive.  CT of the abdomen and pelvis was remarkable for possible LLQinfectious versus inflammatory changes (similar to prior hospitalization in 12/2017). Treated empirically with abx antibiotics for likely aspiration pneumonia.   Assessment & Plan    1. Acute on Chronic systolic CHF - Echo showed newly reduced LVEF of 20 to 25% (40% in 2017)  with diffuse hypokinesis, moderate mitral regurgitation, PA peak pressure 53 mmHg and trivial pericardial effusion. - Uncertain etiology. ? tachy mediated. Her HR in 120-130s here.   - Plan for ischemic evaluation with cath vs coronary CTA to exclude CAD as outpatient.  - She is unable to Po. Currently on IV metoprolol. Home ACE on hold.  - BNP 1100. She diuresed 1.1L. She looks euvolemic. Will reduce lasix to 72m IV daily>> likely change to low dose PO tomorrow.   2. Sinus tachycardia - HR in 120-130s. No arrhythmias. ? Elevated rate due to GI upset.  - Increase IV metoprolol to 7.519mq 6 hours.   3. Hypokalemia - On supplement. May add spironolactone as if no improvement.   4. Hx of CVA/LV thrombus - On warfarin per pharmacy  She is unable to take PO. May consider swallow evaluation. Change to PO medication when able to tolerate.    For questions or updates, please contact [facility] consult [email or web address] for contact info under        Signed, BhLeanor KailPA  08/12/2018, 9:23 AM    I have personally seen and examined this patient. I agree with the assessment and plan as outlined above.  She is overall feeling better this am. She diuresed well with IV Lasix. Will lower IV Lasix to one dose today and re-evaluate tomorrow. She remains tachycardic. ? If this is related to ongoing infection. I would anticipate changing to po Lasix tomorrow if volume status is ok. Will likely need outpatient workup for CAD with coronary CTA.  ChLauree Chandler/18/2019 11:12 AM

## 2018-08-12 NOTE — Progress Notes (Addendum)
Initial Nutrition Assessment  DOCUMENTATION CODES:   Non-severe (moderate) malnutrition in context of chronic illness  INTERVENTION:    Boost Breeze po TID, each supplement provides 250 kcal and 9 grams of protein  Monitor for diet advancement/toleration  Recommend checking A1C  NUTRITION DIAGNOSIS:   Moderate Malnutrition related to chronic illness(CHF/prior CVA) as evidenced by mild fat depletion, moderate fat depletion, moderate muscle depletion, severe muscle depletion, energy intake < 75% for > or equal to 1 month.  GOAL:   Patient will meet greater than or equal to 90% of their needs  MONITOR:   PO intake, Supplement acceptance, Diet advancement, Weight trends, I & O's  REASON FOR ASSESSMENT:   Consult Assessment of nutrition requirement/status  ASSESSMENT:   Patient with PMH significant for HTN, DM, CHF, depression, HLD, and CVA. Presents this admission with complaints of vomiting for 4 days PTA. Admitted for CAT scan showing bowel loops in the left lower quadrant with possible infectious versus inflammatory changes and PNA (likely aspiration).    Pt denies any loss in appetite. States she has never been a "big eater" and she typically consumes 2 meals daily. Meals consist of oatmeal, ham, fried bologna sandwiches, and chicken. For the last 4 days she hasn't had anything PO due to vomiting. Pt attempted to eat apple sauce today but almost immediatly vomited. SLP saw pt today and recommends consideration of esophageal work up for stricture/motility disorder. Pt to stay on clear liquid diet. RD to order supplementation to maximize calories and protein.   Pt endorses a UBW of 110 lb and that she has always been smaller framed. Denies any recent wt loss. Records indicate pt weighed 105 lb 01/14/18 and 109 lb this admission. Pt has history of CHF making it hard to quantify dry wt loss/gain. Nutrition-Focused physical exam completed.   Medications reviewed and include: 40 mg  lasix once daily, reglan TID, 40 mEq KCl once daily Labs reviewed: K 3.1 (L)   NUTRITION - FOCUSED PHYSICAL EXAM:    Most Recent Value  Orbital Region  Mild depletion  Upper Arm Region  Moderate depletion  Thoracic and Lumbar Region  Moderate depletion  Buccal Region  Mild depletion  Temple Region  Mild depletion  Clavicle Bone Region  Moderate depletion  Clavicle and Acromion Bone Region  Moderate depletion  Scapular Bone Region  Moderate depletion  Dorsal Hand  Mild depletion  Patellar Region  Severe depletion  Anterior Thigh Region  Severe depletion  Posterior Calf Region  Severe depletion  Edema (RD Assessment)  None     Diet Order:   Diet Order            Diet clear liquid Room service appropriate? Yes; Fluid consistency: Thin  Diet effective now              EDUCATION NEEDS:   Education needs have been addressed  Skin:  Skin Assessment: Reviewed RN Assessment  Last BM:  08/12/18  Height:   Ht Readings from Last 1 Encounters:  08/09/18 5\' 3"  (1.6 m)    Weight:   Wt Readings from Last 1 Encounters:  08/10/18 49.9 kg    Ideal Body Weight:  52.3 kg  BMI:  Body mass index is 19.48 kg/m.  Estimated Nutritional Needs:   Kcal:  1500-1700 kcal  Protein:  75-90 grams   Fluid:  >/= 1.5 L/day  Mariana Single RD, LDN Clinical Nutrition Pager # - [phone]

## 2018-08-12 NOTE — Evaluation (Signed)
Clinical/Bedside Swallow Evaluation Patient Details  Name: Kristen Lozano MRN: [id number] Date of Birth: 03-02-1956  Today's Date: 08/12/2018 Time: SLP Start Time (ACUTE ONLY): 1339 SLP Stop Time (ACUTE ONLY): 1358 SLP Time Calculation (min) (ACUTE ONLY): 19 min  Past Medical History:  Past Medical History:  Diagnosis Date  . Allergic rhinitis    Requires cetirizine, singulair, and fluticasone.  . Anxiety    Has been on Xanax since 2009. Uses it for stress, anxiety, and insomnia. No contract yet.  . Arthritis   . CHF (congestive heart failure) (HCC)    EF 35% after stroke, presumed ischemic  . Chronic pain    Has OA of knees B. No Xrays in echart. Not requiring narcotics.  . Colitis 12/2017  . Depression   . Diabetes mellitus    Type 2, non insulin dependent. Was dx'd prior to 2008.  Marland Kitchen Hyperglycemia   . Hyperlipemia   . Hypertension   . Sickle cell trait ([place])   . Stroke [facility]) 09/05/14   Dominant left MCA infarcts secondary to unknown embolic source    Past Surgical History:  Past Surgical History:  Procedure Laterality Date  . TEE WITHOUT CARDIOVERSION N/A 09/06/2014   Procedure: TRANSESOPHAGEAL ECHOCARDIOGRAM (TEE);  Surgeon: Kristen Casino, MD;  Location: [facility] ENDOSCOPY;  Service: Cardiovascular;  Laterality: N/A;   HPI:  62 year old woman with a history of LV thrombus and CVA, on anticoagulation, history of hypertension, diabetes type 2, presenting to the ER with persistent nausea and vomiting. CT of the abdomen and pelvis showing thickened bowel loops in the left lower quadrant, infectious versus inflammatory. Dx includes pna, likely due to aspiration per MD notes.  Remote hx of a mild oral dysphagia per [facility] November 2015.    Assessment / Plan / Recommendation Clinical Impression  Pt presents with signs of a primary esophageal dysphagia.  There are no s/s of an oropharyngeal dysphagia.  There were no concerns for aspiration; she tolerated thin liquids without  difficulty.  Oral attention/preparation was WNL. However, trials of purees elicited consistent regurgitation of what appeared to be most of bolus 3/3 trials (undigested applesauce).  Recommend consideration of esophageal w/u for stricture/motility disorder. Continue clear liquids - no further SLP f/u recommended for what appears to primarily esophageal.  SLP Visit Diagnosis: Dysphagia, unspecified (R13.10)    Aspiration Risk       Diet Recommendation   clear liquids as ordered  Medication Administration: Whole meds with liquid    Other  Recommendations Recommended Consults: Consider esophageal assessment Oral Care Recommendations: Oral care BID   Follow up Recommendations None      Frequency and Duration            Prognosis        Swallow Study   General Date of Onset: 08/09/18 HPI: 62 year old woman with a history of LV thrombus and CVA, on anticoagulation, history of hypertension, diabetes type 2, presenting to the ER with persistent nausea and vomiting. CT of the abdomen and pelvis showing thickened bowel loops in the left lower quadrant, infectious versus inflammatory. Dx includes pna, likely due to aspiration per MD notes.  Remote hx of a mild oral dysphagia per [facility] November 2015.  Type of Study: Bedside Swallow Evaluation Diet Prior to this Study: Other (Comment)(clear liquid diet) Temperature Spikes Noted: No Respiratory Status: Room air History of Recent Intubation: No Behavior/Cognition: Alert Oral Cavity Assessment: Within Functional Limits Oral Care Completed by SLP: No Oral Cavity - Dentition: Missing dentition Vision:  Functional for self-feeding Self-Feeding Abilities: Able to feed self Patient Positioning: Upright in bed Baseline Vocal Quality: Normal;Low vocal intensity Volitional Cough: Strong Volitional Swallow: Able to elicit    Oral/Motor/Sensory Function Overall Oral Motor/Sensory Function: Other (comment)(right lower facial asymmetry)   Ice Chips  Ice chips: Not tested   Thin Liquid Thin Liquid: Within functional limits    Nectar Thick Nectar Thick Liquid: Not tested   Honey Thick Honey Thick Liquid: Not tested   Puree Puree: Impaired Other Comments: regurgitation   Solid     Solid: Not tested      Kristen Lozano 08/12/2018,2:10 PM

## 2018-08-12 NOTE — Progress Notes (Signed)
ANTICOAGULATION CONSULT NOTE --Follow up  Pharmacy Consult for Warfarin, Hep if INR<2 Indication: hx LV thrombus/CVA  No Known Allergies  Patient Measurements: Height: 5\' 3"  (160 cm) Weight: 109 lb 15.8 oz (49.9 kg) IBW/kg (Calculated) : 52.4 Heparin Dosing Weight: 49.9 kg   Vital Signs: Temp: 99.1 F (37.3 C) (09/18 0734) Temp Source: Oral (09/18 0734) BP: 150/96 (09/18 0734) Pulse Rate: 116 (09/18 0734)  Labs: Recent Labs    08/09/18 1321  08/09/18 1643 08/10/18 0211 08/11/18 0636 08/12/18 0637  HGB 12.9  --   --  12.5 13.0  --   HCT 42.0  --   --  40.3 40.2  --   PLT 291  --   --  261 279  --   LABPROT  --    < > 25.7* 25.1* 28.6* 27.9*  INR  --    < > 2.37 2.30 2.71 2.63  CREATININE 0.82  --   --  0.87 0.90 0.81  TROPONINI  --   --  <0.03  --   --   --    < > = values in this interval not displayed.    Estimated Creatinine Clearance: 56 mL/min (by C-G formula based on SCr of 0.81 mg/dL).   Medications:  Scheduled:  . furosemide  40 mg Intravenous BID  . Influenza vac split quadrivalent PF  0.5 mL Intramuscular Tomorrow-1000  . insulin aspart  0-9 Units Subcutaneous Q4H  . metoCLOPramide (REGLAN) injection  5 mg Intravenous Q8H  . metoprolol tartrate  5 mg Intravenous Q6H  . potassium chloride  40 mEq Oral Daily  . Warfarin - Pharmacist Dosing Inpatient   Does not apply q1800    Assessment: 66 YOF  presenting with mid-lower abdominal pain and nausea/vomiting x1-2 days. On warfarin PTA for hx of LV thrombus/CVA. Home regimen is 5 mg daily except 7.5 mg on Monday/Friday. INR on admission today was 2.37. Warfarin held initially while NPO, then pharmacy consulted to resume on 9/16 evening.   INR today is therapeutic despite the fact that the patient refused her warfarin dose on 9/16 in the evening (INR 2.63, goal of 2-3). CBC wnl and stable. Despite starting a clear diet - the patient still has poor po intake charted which will increase the warfarin  sensitivity.  Goal of Therapy:  INR 2-3 Monitor platelets by anticoagulation protocol: Yes   Plan:  - Warfarin 2.5 x 1 dose at 1800 today - Will continue to monitor for any signs/symptoms of bleeding and will follow up with PT/INR in the a.m.   Eleana Tocco A. Levada Dy, PharmD, [place] Pager: [phone] Please utilize Amion for appropriate phone number to reach the unit pharmacist ([place])   08/12/2018 8:02 AM

## 2018-08-13 DIAGNOSIS — E440 Moderate protein-calorie malnutrition: Secondary | ICD-10-CM

## 2018-08-13 LAB — GLUCOSE, CAPILLARY
GLUCOSE-CAPILLARY: 102 mg/dL — AB (ref 70–99)
Glucose-Capillary: 139 mg/dL — ABNORMAL HIGH (ref 70–99)
Glucose-Capillary: 151 mg/dL — ABNORMAL HIGH (ref 70–99)
Glucose-Capillary: 154 mg/dL — ABNORMAL HIGH (ref 70–99)
Glucose-Capillary: 170 mg/dL — ABNORMAL HIGH (ref 70–99)
Glucose-Capillary: 182 mg/dL — ABNORMAL HIGH (ref 70–99)
Glucose-Capillary: 251 mg/dL — ABNORMAL HIGH (ref 70–99)

## 2018-08-13 LAB — POTASSIUM: Potassium: 3.3 mmol/L — ABNORMAL LOW (ref 3.5–5.1)

## 2018-08-13 LAB — BASIC METABOLIC PANEL
Anion gap: 12 (ref 5–15)
BUN: 11 mg/dL (ref 8–23)
CO2: 32 mmol/L (ref 22–32)
Calcium: 9 mg/dL (ref 8.9–10.3)
Chloride: 95 mmol/L — ABNORMAL LOW (ref 98–111)
Creatinine, Ser: 0.88 mg/dL (ref 0.44–1.00)
GFR calc Af Amer: >60 mL/min (ref >60)
GFR calc non Af Amer: >60 mL/min (ref >60)
Glucose, Bld: 151 mg/dL — ABNORMAL HIGH (ref 70–99)
POTASSIUM: 2.7 mmol/L — AB (ref 3.5–5.1)
Sodium: 139 mmol/L (ref 135–145)

## 2018-08-13 LAB — PROTIME-INR
INR: 2.59
PROTHROMBIN TIME: 27.5 s — AB (ref 11.4–15.2)

## 2018-08-13 LAB — MAGNESIUM: Magnesium: 1.9 mg/dL (ref 1.7–2.4)

## 2018-08-13 MED ORDER — METOPROLOL TARTRATE 50 MG PO TABS
50.0000 mg | ORAL_TABLET | Freq: Two times a day (BID) | ORAL | Status: DC
  Administered 2018-08-13 – 2018-08-14 (×3): 50 mg via ORAL
  Filled 2018-08-13 (×3): qty 1

## 2018-08-13 MED ORDER — POTASSIUM CHLORIDE 10 MEQ/100ML IV SOLN
10.0000 meq | INTRAVENOUS | Status: AC
  Administered 2018-08-13 (×4): 10 meq via INTRAVENOUS
  Filled 2018-08-13 (×4): qty 100

## 2018-08-13 MED ORDER — ENSURE ENLIVE PO LIQD
237.0000 mL | Freq: Three times a day (TID) | ORAL | Status: DC
  Administered 2018-08-13 – 2018-08-17 (×10): 237 mL via ORAL

## 2018-08-13 MED ORDER — POTASSIUM CHLORIDE CRYS ER 20 MEQ PO TBCR
40.0000 meq | EXTENDED_RELEASE_TABLET | Freq: Two times a day (BID) | ORAL | Status: AC
  Administered 2018-08-13 (×2): 40 meq via ORAL
  Filled 2018-08-13 (×2): qty 2

## 2018-08-13 MED ORDER — WARFARIN SODIUM 2.5 MG PO TABS
2.5000 mg | ORAL_TABLET | Freq: Once | ORAL | Status: AC
  Administered 2018-08-13: 2.5 mg via ORAL
  Filled 2018-08-13: qty 1

## 2018-08-13 NOTE — Evaluation (Signed)
Physical Therapy Evaluation Patient Details Name: Kristen Lozano MRN: [id number] DOB: January 15, 1956 Today's Date: 08/13/2018   History of Present Illness  Pt is a 62 y.o. F with significant PMH of LV thrombus and CVA, history of hypertension, diabetes type 2, presenting to ER with persistent nausea and vomiting over last 4 days, accompanied by lower abdominal pain without diarrhea.   Clinical Impression  Pt admitted with above diagnosis. Pt currently with functional limitations due to the deficits listed below (see PT Problem List). Presenting with decreased functional mobility secondary to decreased attention, orientation, awareness, and safety/judgment. Likely at baseline cognitively but no family/caregiver present to confirm. Pt endorses 3 falls recently. On PT evaluation, ambulating with minimal assistance for balance and demonstrates gross unsteadiness. Decreased gait speed indicates high risk for recurrent falls. Concerned about patient's ability to perform ADL's/IADL's safely and decreased caregiver support, therefore recommending SNF currently. Pt will benefit from skilled PT to increase their independence and safety with mobility to allow discharge to the venue listed below.       Follow Up Recommendations SNF;Supervision/Assistance - 24 hour    Equipment Recommendations  Other (comment)(TBD)    Recommendations for Other Services       Precautions / Restrictions Precautions Precautions: Fall Restrictions Weight Bearing Restrictions: No      Mobility  Bed Mobility Overal bed mobility: Needs Assistance Bed Mobility: Supine to Sit;Sit to Supine     Supine to sit: Supervision Sit to supine: Supervision      Transfers Overall transfer level: Needs assistance Equipment used: None Transfers: Sit to/from Stand Sit to Stand: Min guard            Ambulation/Gait Ambulation/Gait assistance: Min assist Gait Distance (Feet): 100 Feet Assistive device: None Gait  Pattern/deviations: Step-through pattern;Narrow base of support;Drifts right/left;Scissoring Gait velocity: decr Gait velocity interpretation: <1.8 ft/sec, indicate of risk for recurrent falls General Gait Details: Patient with uncoordinated gait, and increased unsteadiness with dual tasking or distractions. No arm swing  Stairs            Wheelchair Mobility    Modified Rankin (Stroke Patients Only)       Balance Overall balance assessment: Needs assistance Sitting-balance support: No upper extremity supported;Feet supported Sitting balance-Leahy Scale: Good     Standing balance support: No upper extremity supported;During functional activity Standing balance-Leahy Scale: Poor                               Pertinent Vitals/Pain Pain Assessment: Faces Faces Pain Scale: No hurt    Home Living Family/patient expects to be discharged to:: Private residence Living Arrangements: Alone                    Prior Function Level of Independence: Needs assistance         Comments: Pt is a poor historian. States her ex husband performs IADL's i.e. grocery shopping, transportation to appointments and she is independent with ADL's. Endorses 3 falls recently, "in the yard."     Hand Dominance        Extremity/Trunk Assessment   Upper Extremity Assessment Upper Extremity Assessment: Generalized weakness    Lower Extremity Assessment Lower Extremity Assessment: Generalized weakness       Communication   Communication: No difficulties  Cognition Arousal/Alertness: Awake/alert Behavior During Therapy: Flat affect Overall Cognitive Status: Impaired/Different from baseline Area of Impairment: Orientation;Attention;Memory;Following commands;Safety/judgement;Awareness;Problem solving  Orientation Level: Disoriented to;Time;Situation Current Attention Level: Sustained Memory: Decreased short-term memory Following Commands:  Follows one step commands inconsistently;Follows one step commands with increased time Safety/Judgement: Decreased awareness of safety;Decreased awareness of deficits Awareness: Intellectual Problem Solving: Slow processing;Requires verbal cues;Requires tactile cues General Comments: Per chart review, pt has lower education/comprehension level at baseline with poor insight into medical issues. States year is 1976 and continuously perseverating on this.      General Comments      Exercises     Assessment/Plan    PT Assessment Patient needs continued PT services  PT Problem List Decreased strength;Decreased activity tolerance;Decreased balance;Decreased mobility;Decreased coordination;Decreased cognition;Decreased safety awareness       PT Treatment Interventions DME instruction;Gait training;Stair training;Functional mobility training;Therapeutic activities;Therapeutic exercise;Balance training;Patient/family education    PT Goals (Current goals can be found in the Care Plan section)  Acute Rehab PT Goals Patient Stated Goal: none stated, agreeable to participate in therapy PT Goal Formulation: With patient Time For Goal Achievement: 08/27/18 Potential to Achieve Goals: Fair    Frequency Min 3X/week   Barriers to discharge Decreased caregiver support      Co-evaluation               AM-PAC PT "6 Clicks" Daily Activity  Outcome Measure Difficulty turning over in bed (including adjusting bedclothes, sheets and blankets)?: None Difficulty moving from lying on back to sitting on the side of the bed? : None Difficulty sitting down on and standing up from a chair with arms (e.g., wheelchair, bedside commode, etc,.)?: None Help needed moving to and from a bed to chair (including a wheelchair)?: A Little Help needed walking in hospital room?: A Little Help needed climbing 3-5 steps with a railing? : A Lot 6 Click Score: 20    End of Session Equipment Utilized During  Treatment: Gait belt Activity Tolerance: Patient tolerated treatment well Patient left: in bed;with call bell/phone within reach Nurse Communication: Mobility status PT Visit Diagnosis: Unsteadiness on feet (R26.81);Muscle weakness (generalized) (M62.81);Difficulty in walking, not elsewhere classified (R26.2)    Time: 7782-4235 PT Time Calculation (min) (ACUTE ONLY): 14 min   Charges:   PT Evaluation $PT Eval Moderate Complexity: 1 Mod         Ellamae Sia, [place], DPT Acute Rehabilitation Services Pager [phone] Office [phone]   Willy Eddy 08/13/2018, 3:44 PM

## 2018-08-13 NOTE — Progress Notes (Addendum)
PROGRESS NOTE    Kristen Lozano  WUG:[id number] DOB: 1956/09/08 DOA: 08/09/2018 PCP: Audley Hose, MD    Brief Narrative:  62 year old woman with a history of LV thrombus and CVA, on anticoagulation, history of hypertension, diabetes type 2, presenting to theERwith persistent nausea and vomiting over the last 4 days,accompanied by lower abdominal crampy pain, without diarrheaand non productive. No apparent food poisoning, sickrecent contacts or recent travel. She was not on antibiotics.without fever or chills. CT of the abdomen and pelvis was remarkable for possible LLQinfectious versus inflammatory changes(similar to prior hospitalization in 12/2017). She was placed on empiric antibiotics with azithromycin and ceftriaxonethenon Zosyn.  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 Abdominal pain improved but not completely resolved.  She is currently on zosyn. And clear liquid diet.  Advance to full liquid diet today and advance as tolerated.  She will probably need colonoscopy for further evaluation, recommend outpatient followup with GASTROENTEROLOGY on discharge.  She is afebrile and wbc wnl.     Right mid lobe infiltrate: possibly aspiration pneumonia:  Currently on zosyn, .  SLP evaluation to evaluate for aspiration.    Acute systolic and diastolic heart failure:  Echocardiogram showed LVEF of 20 to 25%,  with diffuse wall abnormalities.  She was started on IV lasix with good diuresis about 2.6 lit since admission.  cardilogy consulted and appreciate recommendations.  Plan to transition to oral lasix in am as she appears compensated.  Replace potassium as needed.  Magnesium level is adequate.  Creatinine stable .    Severe hypokalemia:  Replace with both IV and po potassium .    Hypertension Well controlled.    Hyperlipidemia:  Resume lipitor.    Diabetes Mellitus: CBG (last 3)  Recent Labs    08/13/18 0035 08/13/18 0422 08/13/18 0729  GLUCAP 151* 102* 139*   Resume SSI.  Resume home meds on discharge.    H/o LV thrombus and h/o of aFIB:  Rate controlled.  On coumadin for anti coagulation and INR is therapeutic.   Tachycardia:  Probably from enteritis and aspiration pneumonia, on BB.   Moderate Malnutrition:  Nutrition consulted and PT evaluation ordered.    DVT prophylaxis: coumadin.  Code Status: full code.  Family Communication:  None at bedside.  Disposition Plan: pending PT evaluation.   Consultants:   Cardiology.    Procedures: none.   Antimicrobials: zosyn.   Subjective: Denies any chest pain or sob, or palpitation. No nausea, or vomiting.   Objective: Vitals:   08/12/18 1602 08/12/18 2200 08/13/18 0422 08/13/18 0730  BP:  (!) 142/95 108/76 123/77  Pulse: (!) 118 (!) 106 (!) 106 (!) 113  Resp:  20 18 18   Temp: 99 F (37.2 C) 97.8 F (36.6 C) 98.9 F (37.2 C) 98.5 F (36.9 C)  TempSrc: Oral Oral  Oral  SpO2: 98% 98% 98% 98%  Weight:  Height:        Intake/Output Summary (Last 24 hours) at 08/13/2018 1031 Last data filed at 08/13/2018 1020 Gross per 24 hour  Intake 602.46 ml  Output 1700 ml  Net -1097.54 ml   Filed Weights   08/09/18 2130 08/10/18 2032  Weight: 49.9 kg 49.9 kg    Examination:  General exam: calm, off oxygen. Frail.  Respiratory system: diminished at bases.  Cardiovascular system: S1 & S2 heard,  tachycardic, no JVD or murmer.   Gastrointestinal system: Abdomen is nondistended, soft and nontender. No organomegaly or masses felt. Normal bowel sounds heard. Central nervous system: Alert and oriented. No focal neurological deficits. Extremities: Symmetric 5 x 5 power. Skin: No rashes, lesions or ulcers Psychiatry: Mood & affect appropriate.     Data Reviewed: I have personally reviewed following labs and imaging studies  CBC: Recent Labs  Lab 08/09/18 1321 08/10/18 0211 08/11/18 0636 08/12/18 0637  WBC 6.9 7.6 11.9* 9.8  NEUTROABS  --  6.4  --   --   HGB 12.9 12.5 13.0 14.2  HCT 42.0 40.3 40.2 44.5  MCV 70.4* 69.4* 67.7* 67.5*  PLT 291 261 279 638   Basic Metabolic Panel: Recent Labs  Lab 08/09/18 1321 08/10/18 0211 08/11/18 0636 08/12/18 0637 08/13/18 0645  NA 139 142 141 139 139  K 3.8 3.6 2.7* 3.1* 2.7*  CL 107 107 100 99 95*  CO2 22 25 24 26  32  GLUCOSE 183* 172* 159* 135* 151*  BUN 12 12 12 9 11   CREATININE 0.82 0.87 0.90 0.81 0.88  CALCIUM 9.1 9.1 9.3 8.7* 9.0  MG  --   --  1.7  --  1.9   GFR: Estimated Creatinine Clearance: 51.5 mL/min (by C-G formula based on SCr of 0.88 mg/dL). Liver Function Tests: Recent Labs  Lab 08/09/18 1321 08/10/18 0211  AST 29 33  ALT 32 39  ALKPHOS 41 40  BILITOT 1.1 1.1  PROT 6.9 7.0  ALBUMIN 3.8 3.6   Recent Labs  Lab 08/09/18 1321  LIPASE 30   No results for input(s): AMMONIA in the last 168 hours. Coagulation Profile: Recent Labs  Lab 08/09/18 1643 08/10/18 0211 08/11/18 0636 08/12/18 0637 08/13/18 0645  INR 2.37 2.30 2.71 2.63 2.59   Cardiac Enzymes: Recent Labs  Lab 08/09/18 1643  TROPONINI <0.03   BNP (last 3 results) No results for input(s): PROBNP in the last 8760 hours. HbA1C: No results for input(s): HGBA1C in the last 72 hours. CBG: Recent Labs  Lab 08/12/18 1601 08/12/18 2037 08/13/18 0035 08/13/18 0422 08/13/18 0729  GLUCAP 160* 185* 151* 102* 139*   Lipid Profile: No  results for input(s): CHOL, HDL, LDLCALC, TRIG, CHOLHDL, LDLDIRECT in the last 72 hours. Thyroid Function Tests: No results for input(s): TSH, T4TOTAL, FREET4, T3FREE, THYROIDAB in the last 72 hours. Anemia Panel: No results for input(s): VITAMINB12, FOLATE, FERRITIN, TIBC, IRON, RETICCTPCT in the last 72 hours. Sepsis Labs: Recent Labs  Lab 08/10/18 0211 08/10/18 0532 08/10/18 1338 08/11/18 1142  LATICACIDVEN 2.0* 2.1* 2.0* 1.8    Recent Results (from the past 240 hour(s))  Blood culture (routine x 2)     Status: None (Preliminary result)   Collection Time: 08/09/18  4:42 PM  Result Value Ref Range Status   Specimen Description BLOOD LEFT FOREARM  Final   Special Requests   Final    BOTTLES DRAWN AEROBIC ONLY Blood Culture results may not be optimal due to an excessive volume of blood received in  culture bottles   Culture   Final    NO GROWTH 4 DAYS Performed at [facility], [place] [address], [place], [place] [address]    Report Status PENDING  Incomplete  Blood culture (routine x 2)     Status: None (Preliminary result)   Collection Time: 08/09/18  4:44 PM  Result Value Ref Range Status   Specimen Description BLOOD RIGHT ANTECUBITAL  Final   Special Requests   Final    BOTTLES DRAWN AEROBIC AND ANAEROBIC Blood Culture adequate volume   Culture   Final    NO GROWTH 4 DAYS Performed at [facility], [place] [address] [place], [place] [address]    Report Status PENDING  Incomplete         Radiology Studies: No results found.      Scheduled Meds: . feeding supplement  1 Container Oral TID BM  . insulin aspart  0-9 Units Subcutaneous Q4H  . metoCLOPramide (REGLAN) injection  5 mg Intravenous Q8H  . metoprolol tartrate  50 mg Oral BID  . potassium chloride  40 mEq Oral BID  . Warfarin - Pharmacist Dosing Inpatient   Does not apply q1800   Continuous Infusions: . piperacillin-tazobactam (ZOSYN)  IV 3.375 g (08/13/18 0927)  . potassium chloride 10 mEq  (08/13/18 0921)     LOS: 2 days    Time spent: 35 minutes.     Hosie Poisson, MD [facility] Pager [phone] If 7PM-7AM, please contact night-coverage [email or web address] Password [email or web address] 08/13/2018, 10:31 AM

## 2018-08-13 NOTE — Plan of Care (Signed)
  Problem: Health Behavior/Discharge Planning: Goal: Ability to manage health-related needs will improve Outcome: Progressing   

## 2018-08-13 NOTE — Progress Notes (Addendum)
Progress Note  Patient Name: Kristen Lozano Date of Encounter: 08/13/2018  Primary Cardiologist: Pixie Casino, MD   Subjective  Feeling well. No chest pain, sob or palpitations. Now on clear liquid diet.   Inpatient Medications    Scheduled Meds: . feeding supplement  1 Container Oral TID BM  . furosemide  40 mg Intravenous Daily  . insulin aspart  0-9 Units Subcutaneous Q4H  . metoCLOPramide (REGLAN) injection  5 mg Intravenous Q8H  . metoprolol tartrate  7.5 mg Intravenous Q6H  . potassium chloride  40 mEq Oral BID  . Warfarin - Pharmacist Dosing Inpatient   Does not apply q1800   Continuous Infusions: . piperacillin-tazobactam (ZOSYN)  IV 3.375 g (08/13/18 0927)  . potassium chloride 10 mEq (08/13/18 0921)   PRN Meds: acetaminophen **OR** acetaminophen, ondansetron **OR** ondansetron (ZOFRAN) IV   Vital Signs    Vitals:   08/12/18 1602 08/12/18 2200 08/13/18 0422 08/13/18 0730  BP:  (!) 142/95 108/76 123/77  Pulse: (!) 118 (!) 106 (!) 106 (!) 113  Resp:  20 18 18   Temp: 99 F (37.2 C) 97.8 F (36.6 C) 98.9 F (37.2 C) 98.5 F (36.9 C)  TempSrc: Oral Oral  Oral  SpO2: 98% 98% 98% 98%  Weight:      Height:        Intake/Output Summary (Last 24 hours) at 08/13/2018 0947 Last data filed at 08/13/2018 0930 Gross per 24 hour  Intake 662.46 ml  Output 1560 ml  Net -897.54 ml   Filed Weights   08/09/18 2130 08/10/18 2032  Weight: 49.9 kg 49.9 kg    Telemetry    Sinus tachycardia at 120-140s- Personally Reviewed  ECG    N/A  Physical Exam   GEN: Thin frail female in NAD Neck: No JVD Cardiac: RRR, no murmurs, rubs, or gallops.  Respiratory: Clear to auscultation bilaterally. GI: Soft, nontender, non-distended  MS: No edema; No deformity. Neuro:  Nonfocal  Psych: Normal affect   Labs    Chemistry Recent Labs  Lab 08/09/18 1321 08/10/18 0211 08/11/18 0636 08/12/18 0637 08/13/18 0645  NA 139 142 141 139 139  K 3.8 3.6 2.7* 3.1* 2.7*    CL 107 107 100 99 95*  CO2 22 25 24 26  32  GLUCOSE 183* 172* 159* 135* 151*  BUN 12 12 12 9 11   CREATININE 0.82 0.87 0.90 0.81 0.88  CALCIUM 9.1 9.1 9.3 8.7* 9.0  PROT 6.9 7.0  --   --   --   ALBUMIN 3.8 3.6  --   --   --   AST 29 33  --   --   --   ALT 32 39  --   --   --   ALKPHOS 41 40  --   --   --   BILITOT 1.1 1.1  --   --   --   GFRNONAA >60 >60 >60 >60 >60  GFRAA >60 >60 >60 >60 >60  ANIONGAP 10 10 17* 14 12     Hematology Recent Labs  Lab 08/10/18 0211 08/11/18 0636 08/12/18 0637  WBC 7.6 11.9* 9.8  RBC 5.81* 5.94* 6.59*  HGB 12.5 13.0 14.2  HCT 40.3 40.2 44.5  MCV 69.4* 67.7* 67.5*  MCH 21.5* 21.9* 21.5*  MCHC 31.0 32.3 31.9  RDW 14.8 14.4 15.3  PLT 261 279 268    Cardiac Enzymes Recent Labs  Lab 08/09/18 1643  TROPONINI <0.03   No results for input(s): TROPIPOC  in the last 168 hours.   BNP Recent Labs  Lab 08/09/18 1643  BNP 1,148.3*     DDimer No results for input(s): DDIMER in the last 168 hours.   Radiology    No results found.  Cardiac Studies   Echo 08/10/18 Study Conclusions  - Left ventricle: The cavity size was normal. Wall thickness was normal. Systolic function was severely reduced. The estimated ejection fraction was in the range of 20% to 25%. Diffuse hypokinesis. The study is not technically sufficient to allow evaluation of LV diastolic function. - Ventricular septum: Septal motion showed abnormal function and dyssynergy. - Aortic valve: There was trivial regurgitation. - Mitral valve: There was moderate regurgitation. - Left atrium: The atrium was mildly dilated. - Tricuspid valve: There was trivial regurgitation. - Pulmonary arteries: Systolic pressure was moderately increased. PA peak pressure: 53 mm Hg (S). - Pericardium, extracardiac: A trivial pericardial effusion was identified. There was a left pleural effusion.  Impressions:  - EF is reduced when compared to prior study (40%)  Patient  Profile     Kristen Lozano a 62 y.o.femalewith a hx of CVA, HTN, DM, LV thrombus on chronic Coumadin, hypertension, chronic systolic CHF,  DM 2presented 9/15 with persistent nausea and vomiting over the last 4 days,accompanied by lower abdominal crampy pain, without diarrheaand non productive. CT of the abdomen and pelvis was remarkable for possible LLQinfectious versus inflammatory changes(similar to prior hospitalization in 12/2017). Treated empirically with abx antibiotics for likely aspiration pneumonia.   Assessment & Plan    1. Acute on Chronic systolic CHF - Echo showed newly reduced LVEF of 20 to 25% (40% in 2017)  with diffuse hypokinesis, moderate mitral regurgitation, PA peak pressure 53 mmHg and trivial pericardial effusion. - Uncertain etiology. ? tachy mediated. Her HR in 120-130s here.   - Plan for ischemic evaluation with cath vs coronary CTA to exclude CAD as outpatient.  - BNP 1100. She diuresed 2.4L. She looks euvolemic. She got her IV lasix today. Will hold. Restart tomorrow after reevaluation. Likely will need low dose.  2. Sinus tachycardia - HR in 120-130s. No arrhythmias. ? Elevated rate due to GI upset/underlying infection.  - Change to metoprolol 38m BID.   3. Hypokalemia - On supplement. She is schedule for IV supplement today. Adjust tomorrow.   4. Hx of CVA/LV thrombus - On warfarin per pharmacy    For questions or updates, please contact CHarneyPlease consult [email or web address] for contact info under        Signed, BLeanor Kail PA  08/13/2018, 9:47 AM    I have personally seen and examined this patient. I agree with the assessment and plan as outlined above.  Volume status is ok. She received IV lasix this am. Will probably be ok to switch to po Lasix tomorrow. No other cardiac recs. Outpatient workup will probably include a coronary CTA.   CLauree Chandler9/19/2019 10:06 AM

## 2018-08-13 NOTE — Progress Notes (Signed)
Nutrition Follow-up  DOCUMENTATION CODES:   Non-severe (moderate) malnutrition in context of chronic illness  INTERVENTION:    Ensure Enlive po TID, each supplement provides 350 kcal and 20 grams of protein  Magic cup BID with meals, each supplement provides 290 kcal and 9 grams of protein  Monitor for diet advancement/toleration  NUTRITION DIAGNOSIS:   Moderate Malnutrition related to chronic illness(CHF/prior CVA) as evidenced by mild fat depletion, moderate fat depletion, moderate muscle depletion, severe muscle depletion, energy intake < 75% for > or equal to 1 month.  Ongoing  GOAL:   Patient will meet greater than or equal to 90% of their needs  Progressing but not met  MONITOR:   PO intake, Supplement acceptance, Diet advancement, Weight trends, I & O's  REASON FOR ASSESSMENT:   Consult Assessment of nutrition requirement/status  ASSESSMENT:   Patient with PMH significant for HTN, DM, CHF, depression, HLD, and CVA. Presents this admission with complaints of vomiting for 4 days PTA. Admitted for CAT scan showing bowel loops in the left lower quadrant with possible infectious versus inflammatory changes and PNA (likely aspiration).    9/16- clear liquid diet, vomiting liquids 9/19- full liquid diet  Pt reports vomiting has resolved and her last vomiting episode was yesterday afternoon. She tolerated jello and applesauce without complication for breakfast this morning (completed 25%). Denies any nausea, vomiting, of difficulty swallowing. Pt not meeting nutrition requirements at this time. MD to advanced diet contingent on toleration of full liquids. RD to provide Magic Cup and Ensure to trial thicker consistency supplements.   A recent weight has not been obtained since last RD visit. Will need to obtain weight to monitor trends.   Pt eager to go home.   Medications reviewed and include: reglan TID Labs reviewed: K 2.7 (L)   Diet Order:   Diet Order             Diet full liquid Room service appropriate? Yes; Fluid consistency: Thin  Diet effective now              EDUCATION NEEDS:   Education needs have been addressed  Skin:  Skin Assessment: Reviewed RN Assessment  Last BM:  08/13/18  Height:   Ht Readings from Last 1 Encounters:  08/09/18 '5\' 3"'$  (1.6 m)    Weight:   Wt Readings from Last 1 Encounters:  08/10/18 49.9 kg    Ideal Body Weight:  52.3 kg  BMI:  Body mass index is 19.48 kg/m.  Estimated Nutritional Needs:   Kcal:  1500-1700 kcal  Protein:  75-90 grams   Fluid:  >/= 1.5 L/day  Mariana Single RD, LDN Clinical Nutrition Pager # - [phone]

## 2018-08-13 NOTE — Progress Notes (Signed)
ANTICOAGULATION CONSULT NOTE --Follow up  Pharmacy Consult for Warfarin, Hep if INR<2 Indication: hx LV thrombus/CVA  No Known Allergies  Patient Measurements: Height: 5\' 3"  (160 cm) Weight: 109 lb 15.8 oz (49.9 kg) IBW/kg (Calculated) : 52.4 Heparin Dosing Weight: 49.9 kg   Vital Signs: Temp: 98.5 F (36.9 C) (09/19 0730) Temp Source: Oral (09/19 0730) BP: 123/77 (09/19 0730) Pulse Rate: 113 (09/19 0730)  Labs: Recent Labs    08/11/18 0636 08/12/18 0637 08/13/18 0645  HGB 13.0 14.2  --   HCT 40.2 44.5  --   PLT 279 268  --   LABPROT 28.6* 27.9* 27.5*  INR 2.71 2.63 2.59  CREATININE 0.90 0.81 0.88    Estimated Creatinine Clearance: 51.5 mL/min (by C-G formula based on SCr of 0.88 mg/dL).   Medications:  Scheduled:  . feeding supplement  1 Container Oral TID BM  . insulin aspart  0-9 Units Subcutaneous Q4H  . metoCLOPramide (REGLAN) injection  5 mg Intravenous Q8H  . metoprolol tartrate  50 mg Oral BID  . potassium chloride  40 mEq Oral BID  . Warfarin - Pharmacist Dosing Inpatient   Does not apply q1800    Assessment: 50 YOF  presenting with mid-lower abdominal pain and nausea/vomiting x1-2 days. On warfarin PTA for hx of LV thrombus/CVA. Home regimen is 5 mg daily except 7.5 mg on Monday/Friday. INR on admission was 2.37. Warfarin held initially while NPO, then pharmacy consulted to resume on 9/16 evening.   9/19: INR today is therapeutic 2.59, goal of 2-3. CBC WNL and stable. Patient still has poor intake which will increase the warfarin sensitivity. As she begins to increase diet, will likely need increased warfarin dosing  Goal of Therapy:  INR 2-3 Monitor platelets by anticoagulation protocol: Yes   Plan:  - Warfarin 2.5 x 1 dose at 1800 today - Will continue to monitor INR and any signs/symptoms of bleeding   Thank you for involving pharmacy in this patient's care.  Janae Bridgeman, PharmD PGY1 Pharmacy Resident Phone: [phone] 08/13/2018 11:07  AM

## 2018-08-14 ENCOUNTER — Inpatient Hospital Stay (HOSPITAL_COMMUNITY): Payer: Medicare HMO

## 2018-08-14 LAB — GLUCOSE, CAPILLARY
Glucose-Capillary: 142 mg/dL — ABNORMAL HIGH (ref 70–99)
Glucose-Capillary: 144 mg/dL — ABNORMAL HIGH (ref 70–99)
Glucose-Capillary: 293 mg/dL — ABNORMAL HIGH (ref 70–99)
Glucose-Capillary: 165 mg/dL — ABNORMAL HIGH (ref 70–99)
Glucose-Capillary: 251 mg/dL — ABNORMAL HIGH (ref 70–99)

## 2018-08-14 LAB — BASIC METABOLIC PANEL
Anion gap: 12 (ref 5–15)
BUN: 13 mg/dL (ref 8–23)
CALCIUM: 9.2 mg/dL (ref 8.9–10.3)
CO2: 34 mmol/L — AB (ref 22–32)
Chloride: 95 mmol/L — ABNORMAL LOW (ref 98–111)
Creatinine, Ser: 0.98 mg/dL (ref 0.44–1.00)
GFR calc Af Amer: >60 mL/min (ref >60)
GFR calc non Af Amer: >60 mL/min (ref >60)
GLUCOSE: 151 mg/dL — AB (ref 70–99)
POTASSIUM: 3.2 mmol/L — AB (ref 3.5–5.1)
Sodium: 141 mmol/L (ref 135–145)

## 2018-08-14 LAB — CBC
HEMATOCRIT: 44.3 % (ref 36.0–46.0)
Hemoglobin: 14.1 g/dL (ref 12.0–15.0)
MCH: 21.6 pg — AB (ref 26.0–34.0)
MCHC: 31.8 g/dL (ref 30.0–36.0)
MCV: 67.8 fL — AB (ref 78.0–100.0)
PLATELETS: 264 10*3/uL (ref 150–400)
RBC: 6.53 MIL/uL — ABNORMAL HIGH (ref 3.87–5.11)
RDW: 14.8 % (ref 11.5–15.5)
WBC: 7.6 10*3/uL (ref 4.0–10.5)

## 2018-08-14 LAB — CULTURE, BLOOD (ROUTINE X 2)
Culture: NO GROWTH
Culture: NO GROWTH
SPECIAL REQUESTS: ADEQUATE

## 2018-08-14 LAB — PROTIME-INR
INR: 2.03
Prothrombin Time: 22.8 seconds — ABNORMAL HIGH (ref 11.4–15.2)

## 2018-08-14 MED ORDER — SPIRONOLACTONE 12.5 MG HALF TABLET
12.5000 mg | ORAL_TABLET | Freq: Every day | ORAL | Status: DC
  Administered 2018-08-14 – 2018-08-17 (×4): 12.5 mg via ORAL
  Filled 2018-08-14 (×4): qty 1

## 2018-08-14 MED ORDER — POTASSIUM CHLORIDE CRYS ER 20 MEQ PO TBCR
40.0000 meq | EXTENDED_RELEASE_TABLET | Freq: Every day | ORAL | Status: AC
  Administered 2018-08-14 – 2018-08-15 (×2): 40 meq via ORAL
  Filled 2018-08-14 (×2): qty 2

## 2018-08-14 MED ORDER — PANTOPRAZOLE SODIUM 40 MG PO TBEC
40.0000 mg | DELAYED_RELEASE_TABLET | Freq: Every day | ORAL | Status: DC
  Administered 2018-08-14 – 2018-08-17 (×4): 40 mg via ORAL
  Filled 2018-08-14 (×4): qty 1

## 2018-08-14 MED ORDER — POTASSIUM CHLORIDE 20 MEQ PO PACK
40.0000 meq | PACK | Freq: Once | ORAL | Status: AC
  Administered 2018-08-14: 40 meq via ORAL
  Filled 2018-08-14: qty 2

## 2018-08-14 MED ORDER — METOPROLOL TARTRATE 50 MG PO TABS
75.0000 mg | ORAL_TABLET | Freq: Two times a day (BID) | ORAL | Status: DC
  Administered 2018-08-14 – 2018-08-17 (×6): 75 mg via ORAL
  Filled 2018-08-14 (×6): qty 1

## 2018-08-14 MED ORDER — METOPROLOL TARTRATE 25 MG PO TABS
25.0000 mg | ORAL_TABLET | Freq: Every day | ORAL | Status: AC
  Administered 2018-08-14: 25 mg via ORAL
  Filled 2018-08-14: qty 1

## 2018-08-14 MED ORDER — WARFARIN SODIUM 7.5 MG PO TABS
7.5000 mg | ORAL_TABLET | Freq: Once | ORAL | Status: AC
  Administered 2018-08-14: 7.5 mg via ORAL
  Filled 2018-08-14: qty 1

## 2018-08-14 NOTE — Progress Notes (Signed)
08/14/18 1044  PT Visit Information  Last PT Received On 08/14/18  Assistance Needed +1  History of Present Illness Pt is a 62 y.o. F with significant PMH of LV thrombus and CVA, history of hypertension, diabetes type 2, presenting to ER with persistent nausea and vomiting over last 4 days, accompanied by lower abdominal pain without diarrhea.   Subjective Data  Patient Stated Goal home  Precautions  Precautions Fall  Restrictions  Weight Bearing Restrictions No  Pain Assessment  Pain Assessment No/denies pain  Cognition  Arousal/Alertness Awake/alert  Behavior During Therapy Flat affect  Overall Cognitive Status Impaired/Different from baseline  Area of Impairment Orientation;Attention;Memory;Following commands;Safety/judgement;Awareness;Problem solving  Orientation Level Disoriented to;Time;Situation  Current Attention Level Sustained  Memory Decreased short-term memory  Following Commands Follows one step commands inconsistently;Follows one step commands with increased time  Safety/Judgement Decreased awareness of safety;Decreased awareness of deficits  Awareness Intellectual  Problem Solving Slow processing;Requires verbal cues;Requires tactile cues  General Comments Per chart review, pt has lower education/comprehension level at baseline with poor insight into medical issues.  Bed Mobility  Overal bed mobility Needs Assistance  Bed Mobility Supine to Sit;Sit to Supine  Supine to sit Min assist  Sit to supine Supervision  General bed mobility comments Reaching for PT hand during trunk elevation. REquiring min A for trunk assist to come to sitting.   Transfers  Overall transfer level Needs assistance  Equipment used 1 person hand held assist  Transfers Sit to/from Stand  Sit to Stand Min guard  General transfer comment Min guard for steadying assist to stand.   Ambulation/Gait  Ambulation/Gait assistance Min assist  Gait Distance (Feet) 100 Feet  Assistive device  None;Straight cane  Gait Pattern/deviations Step-through pattern;Narrow base of support;Drifts right/left;Shuffle;Trunk flexed  General Gait Details Slow, unsteady gait. Pt unable to dual task to perform dynamic gait activities (horizontal/vertical head turns). Pt easily distracted during gait. Attempted gait with cane part of the way, and pt with slightly improved balance, however, remained unsteady.   Gait velocity decr  Gait velocity interpretation <1.8 ft/sec, indicate of risk for recurrent falls  Balance  Overall balance assessment Needs assistance  Sitting-balance support No upper extremity supported;Feet supported  Sitting balance-Leahy Scale Good  Standing balance support Single extremity supported;No upper extremity supported  Standing balance-Leahy Scale Poor  Standing balance comment external support for static standing  PT - End of Session  Equipment Utilized During Treatment Gait belt  Activity Tolerance Patient tolerated treatment well  Patient left in bed;with call bell/phone within reach;with bed alarm set  Nurse Communication Mobility status   PT - Assessment/Plan  PT Plan Current plan remains appropriate  PT Visit Diagnosis Unsteadiness on feet (R26.81);Muscle weakness (generalized) (M62.81);Difficulty in walking, not elsewhere classified (R26.2)  PT Frequency (ACUTE ONLY) Min 3X/week  Follow Up Recommendations SNF;Supervision/Assistance - 24 hour  PT equipment Other (comment) (TBD )  AM-PAC PT "6 Clicks" Daily Activity Outcome Measure  Difficulty turning over in bed (including adjusting bedclothes, sheets and blankets)? 4  Difficulty moving from lying on back to sitting on the side of the bed?  1  Difficulty sitting down on and standing up from a chair with arms (e.g., wheelchair, bedside commode, etc,.)? 1  Help needed moving to and from a bed to chair (including a wheelchair)? 3  Help needed walking in hospital room? 3  Help needed climbing 3-5 steps with a railing?   2  6 Click Score 14  Mobility G Code  CK  PT Goal Progression  Progress towards PT goals Progressing toward goals  Acute Rehab PT Goals  PT Goal Formulation With patient  Time For Goal Achievement 08/27/18  Potential to Achieve Goals Fair  PT Time Calculation  PT Start Time (ACUTE ONLY) 1027  PT Stop Time (ACUTE ONLY) 1040  PT Time Calculation (min) (ACUTE ONLY) 13 min  PT General Charges  $$ ACUTE PT VISIT 1 Visit  PT Treatments  $Gait Training 8-22 mins   Pt progressing towards goals, however, remains unsteady, and presenting with continued cognitive deficits. Pt unable to perform dual task activities and dynamic balance activities during gait. Attempted gait with cane, with slight improvement in balance, however, remained grossly unsteady. Current recommendations appropriate. Will continue to follow acutely to maximize functional mobility independence and safety.   Leighton Ruff, PT, DPT  Acute Rehabilitation Services  Pager: [phone] Office: [phone]

## 2018-08-14 NOTE — Progress Notes (Addendum)
Progress Note  Patient Name: Kristen Lozano Date of Encounter: 08/14/2018  Primary Cardiologist: Pixie Casino, MD   Subjective   Improving. No more abdominal pain. Denies chest pain or dyspnea.   Inpatient Medications    Scheduled Meds: . feeding supplement (ENSURE ENLIVE)  237 mL Oral TID BM  . insulin aspart  0-9 Units Subcutaneous Q4H  . metoCLOPramide (REGLAN) injection  5 mg Intravenous Q8H  . metoprolol tartrate  75 mg Oral BID  . potassium chloride  40 mEq Oral Daily  . spironolactone  12.5 mg Oral Daily  . Warfarin - Pharmacist Dosing Inpatient   Does not apply q1800   Continuous Infusions: . piperacillin-tazobactam (ZOSYN)  IV 3.375 g (08/14/18 0910)   PRN Meds: acetaminophen **OR** acetaminophen, ondansetron **OR** ondansetron (ZOFRAN) IV   Vital Signs    Vitals:   08/13/18 1736 08/13/18 2225 08/14/18 0436 08/14/18 0816  BP: 131/78 121/78 123/76 121/77  Pulse: (!) 118 (!) 115 (!) 125 (!) 111  Resp:   20 18  Temp: 98.5 F (36.9 C) 98.5 F (36.9 C) 98.6 F (37 C) 98.2 F (36.8 C)  TempSrc: Oral Oral Oral Oral  SpO2:  96% 98% 96%  Weight:      Height:        Intake/Output Summary (Last 24 hours) at 08/14/2018 0948 Last data filed at 08/14/2018 0315 Gross per 24 hour  Intake 995.23 ml  Output 775 ml  Net 220.23 ml   Filed Weights   08/09/18 2130 08/10/18 2032  Weight: 49.9 kg 49.9 kg    Telemetry    Sinus tachycardia at 120-130s- Personally Reviewed  ECG  N/A  Physical Exam   GEN: Thin frail female in NAD Neck: No JVD Cardiac: Regular tachycardic, no murmurs, rubs, or gallops.  Respiratory: Clear to auscultation bilaterally. GI: Soft, nontender, non-distended  MS: No edema; No deformity. Neuro:  Nonfocal  Psych: Normal affect   Labs    Chemistry Recent Labs  Lab 08/09/18 1321 08/10/18 0211  08/12/18 6734 08/13/18 0645 08/13/18 1927 08/14/18 0712  NA 139 142   < > 139 139  --  141  K 3.8 3.6   < > 3.1* 2.7* 3.3* 3.2*    CL 107 107   < > 99 95*  --  95*  CO2 22 25   < > 26 32  --  34*  GLUCOSE 183* 172*   < > 135* 151*  --  151*  BUN 12 12   < > 9 11  --  13  CREATININE 0.82 0.87   < > 0.81 0.88  --  0.98  CALCIUM 9.1 9.1   < > 8.7* 9.0  --  9.2  PROT 6.9 7.0  --   --   --   --   --   ALBUMIN 3.8 3.6  --   --   --   --   --   AST 29 33  --   --   --   --   --   ALT 32 39  --   --   --   --   --   ALKPHOS 41 40  --   --   --   --   --   BILITOT 1.1 1.1  --   --   --   --   --   GFRNONAA >60 >60   < > >60 >60  --  >60  GFRAA >60 >60   < > >  60 >60  --  >60  ANIONGAP 10 10   < > 14 12  --  12   < > = values in this interval not displayed.     Hematology Recent Labs  Lab 08/11/18 0636 08/12/18 0637 08/14/18 0712  WBC 11.9* 9.8 7.6  RBC 5.94* 6.59* 6.53*  HGB 13.0 14.2 14.1  HCT 40.2 44.5 44.3  MCV 67.7* 67.5* 67.8*  MCH 21.9* 21.5* 21.6*  MCHC 32.3 31.9 31.8  RDW 14.4 15.3 14.8  PLT 279 268 264    Cardiac Enzymes Recent Labs  Lab 08/09/18 1643  TROPONINI <0.03   No results for input(s): TROPIPOC in the last 168 hours.   BNP Recent Labs  Lab 08/09/18 1643  BNP 1,148.3*     Radiology    No results found.  Cardiac Studies   Echo 08/10/18 Study Conclusions  - Left ventricle: The cavity size was normal. Wall thickness was normal. Systolic function was severely reduced. The estimated ejection fraction was in the range of 20% to 25%. Diffuse hypokinesis. The study is not technically sufficient to allow evaluation of LV diastolic function. - Ventricular septum: Septal motion showed abnormal function and dyssynergy. - Aortic valve: There was trivial regurgitation. - Mitral valve: There was moderate regurgitation. - Left atrium: The atrium was mildly dilated. - Tricuspid valve: There was trivial regurgitation. - Pulmonary arteries: Systolic pressure was moderately increased. PA peak pressure: 53 mm Hg (S). - Pericardium, extracardiac: A trivial pericardial  effusion was identified. There was a left pleural effusion.  Impressions:  - EF is reduced when compared to prior study (40%)  Patient Profile   KLAIRE COURT a 62 y.o.femalewith a hx of CVA, HTN, DM,LV thrombus on chronic Coumadin, hypertension,chronic systolic CHF,DM 2presented 9/15 withpersistent nausea and vomiting over the last 4 days,accompanied by lower abdominal crampy pain, without diarrheaand non productive. CT of the abdomen and pelvis was remarkable for possible LLQinfectious versus inflammatory changes(similar to prior hospitalization in 12/2017). Treated empirically with abxantibiotics for likely aspiration pneumonia.  Assessment & Plan   1. Acute on Chronic systolic CHF - Echo showed newly reduced LVEF of20 to 25%(40% in 2017)with diffuse hypokinesis, moderate mitral regurgitation, PA peak pressure 53 mmHg and trivial pericardial effusion.  Uncertain etiology. ? tachy mediated. Her HR in 120-130s here. Plan forischemic evaluation with cath vs coronary CTA to exclude CADas outpatient.  - BNP 1100. She diuresed 2.1L. She is euvolemic. May be on dryer side. Concern for low PO intakel  - Increase metoprolol as below. Start low dose spironolactone (consider increasing at discharge if BP and renal function remains stable). ACE/ARB as outpatient   2. Sinus tachycardia - HR in 120-130s. No arrhythmias. Her elevated rate due to GI upset/underlying infection.  - Increase metoprolol to 75 mg BID. Will not add cardizem due to low EF.  - IF no improvement during follow up may consider EP eval.   3. Hypokalemia - Give Kdur 61m x 1 today. Add spironolactone as above. Recheck lab tomorrow.   4. Hx of CVA/LV thrombus - On warfarin per pharmacy  CO[place] sign off.   Medication Recommendations:  As discussed above Other recommendations (labs, testing, etc):  Ischemic evaluation as outpatient.  Follow up as an outpatient:  With APP  10/3  For questions or updates, please contact [place]ak[place] ParkPlease consult [email or web address] for contact info under        Signed,Leanor Kail PA  08/14/2018, 9:48 AM    I have  personally seen and examined this patient. I agree with the assessment and plan as outlined above.  Volume status is ok. Agree with adding aldactone and increasing Lopressor. Can discuss coronary CTA at f/u appt.  No further recommendations.   Lauree Chandler 08/14/2018 10:10 AM

## 2018-08-14 NOTE — Evaluation (Signed)
Clinical/Bedside Swallow Evaluation Patient Details  Name: Kristen Lozano MRN: [id number] Date of Birth: [birth year]  Today's Date: 08/14/2018 Time: SLP Start Time (ACUTE ONLY): 4854 SLP Stop Time (ACUTE ONLY): 0925 SLP Time Calculation (min) (ACUTE ONLY): 20 min  Past Medical History:  Past Medical History:  Diagnosis Date  . Allergic rhinitis    Requires cetirizine, singulair, and fluticasone.  . Anxiety    Has been on Xanax since 2009. Uses it for stress, anxiety, and insomnia. No contract yet.  . Arthritis   . CHF (congestive heart failure) (HCC)    EF 35% after stroke, presumed ischemic  . Chronic pain    Has OA of knees B. No Xrays in echart. Not requiring narcotics.  . Colitis 12/2017  . Depression   . Diabetes mellitus    Type 2, non insulin dependent. Was dx'd prior to 2008.  Marland Kitchen Hyperglycemia   . Hyperlipemia   . Hypertension   . Sickle cell trait ([place])   . Stroke [facility]) 09/05/14   Dominant left MCA infarcts secondary to unknown embolic source    Past Surgical History:  Past Surgical History:  Procedure Laterality Date  . TEE WITHOUT CARDIOVERSION N/A 09/06/2014   Procedure: TRANSESOPHAGEAL ECHOCARDIOGRAM (TEE);  Surgeon: Pixie Casino, MD;  Location: [facility] ENDOSCOPY;  Service: Cardiovascular;  Laterality: N/A;   HPI:  62 year old woman with a history of LV thrombus and CVA, hypertension, diabetes type 2, presenting to the ER with persistent nausea and vomiting. Per chart CT of the abdomen and pelvis showing thickened bowel loops in the left lower quadrant, infectious versus inflammatory.  Dx includes pna, likely due to aspiration per MD notes. Remote hx of a mild oral dysphagia per [facility] November 2015. BSE 08/12/18 suspected primary esophageal dysphagia, no concerns for oropharyngeal dysphagia and recommended work up for stricture/motility disorder and d/c ST. BSE reordered 9/10.    Assessment / Plan / Recommendation Clinical Impression  Differential diagnosis includes  primary esophageal component vs primary pharyngeal dysphagia with esophageal involvement exacerbated by suspected behavioral component. On arrival pt expecorated small pill, manipulated single particulate of grits, expectorated additional small piece pill with significant difficulty initiating pharyngeal swallow after multiple attempts. Laryngeal elevation appears decreased on palpation although cannot fully evaluate clinically. Consumed approximately 4-5 straw sips water without difficulty initiating swallow or s/s aspiration. Several occasions she reclined and began to cough suspicious for esophageal deficits. Pt reluctant to eat (behaviorally not unusual if esophageal impairments present) and participate in recommended MBS however with additional explanation/reasoning for assessment was agreeable. MBS scheduled for 11:30 today.   SLP Visit Diagnosis: Dysphagia, unspecified (R13.10)    Aspiration Risk  Mild aspiration risk    Diet Recommendation Thin liquid(clears)   Liquid Administration via: Straw;Cup Medication Administration: Crushed with puree Supervision: Patient able to self feed Postural Changes: Seated upright at 90 degrees;Remain upright for at least 30 minutes after po intake    Other  Recommendations Oral Care Recommendations: Oral care BID   Follow up Recommendations Other (comment)(TBD)      Frequency and Duration            Prognosis        Swallow Study   General HPI: 62 year old woman with a history of LV thrombus and CVA, hypertension, diabetes type 2, presenting to the ER with persistent nausea and vomiting. Per chart CT of the abdomen and pelvis showing thickened bowel loops in the left lower quadrant, infectious versus inflammatory.  Dx includes pna, likely due to  aspiration per MD notes. Remote hx of a mild oral dysphagia per [facility] November 2015. BSE 08/12/18 suspected primary esophageal dysphagia, no concerns for oropharyngeal dysphagia and recommended work up for  stricture/motility disorder and d/c ST. BSE reordered 9/10.  Type of Study: Bedside Swallow Evaluation Previous Swallow Assessment: see HPI Diet Prior to this Study: Thin liquids(clear liquids) Temperature Spikes Noted: No Respiratory Status: Room air History of Recent Intubation: No Behavior/Cognition: Alert;Requires cueing;Cooperative Oral Cavity Assessment: Within Functional Limits Oral Care Completed by SLP: No Oral Cavity - Dentition: Missing dentition Vision: Functional for self-feeding Self-Feeding Abilities: Able to feed self Patient Positioning: Upright in bed Baseline Vocal Quality: Normal Volitional Cough: Strong Volitional Swallow: Unable to elicit    Oral/Motor/Sensory Function Overall Oral Motor/Sensory Function: Within functional limits   Ice Chips Ice chips: Not tested   Thin Liquid Thin Liquid: Impaired Presentation: Straw Oral Phase Impairments: (none) Pharyngeal  Phase Impairments: Suspected delayed Swallow;Decreased hyoid-laryngeal movement    Nectar Thick Nectar Thick Liquid: Not tested   Honey Thick Honey Thick Liquid: Not tested   Puree Puree: Impaired Presentation: Self Fed Oral Phase Impairments: Other (comment)(sensitive to particulates of grapes, pill) Oral Phase Functional Implications: Prolonged oral transit Pharyngeal Phase Impairments: (none)   Solid     Solid: Not tested      Houston Siren 08/14/2018,9:55 AM Orbie Pyo Colvin Caroli.Ed Risk analyst [phone] Office [phone]

## 2018-08-14 NOTE — Care Management Important Message (Signed)
Important Message  Patient Details  Name: Kristen Lozano MRN: [id number] Date of Birth: June 13, 1956   Medicare Important Message Given:  Yes    Orbie Pyo 08/14/2018, 2:07 PM

## 2018-08-14 NOTE — Progress Notes (Signed)
PROGRESS NOTE    Kristen Lozano  GHW:[id number] DOB: 1956-07-31 DOA: 08/09/2018 PCP: Audley Hose, MD    Brief Narrative:  62 year old woman with a history of LV thrombus and CVA, on anticoagulation, history of hypertension, diabetes type 2, presenting to theERwith persistent nausea and vomiting over the last 4 days,accompanied by lower abdominal crampy pain, without diarrheaand non productive. No apparent food poisoning, sickrecent contacts or recent travel. She was not on antibiotics.without fever or chills. CT of the abdomen and pelvis was remarkable for possible LLQinfectious versus inflammatory changes(similar to prior hospitalization in 12/2017). She was placed on empiric antibiotics with azithromycin and ceftriaxonethenon Zosyn.  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 Pt reports no abdominal pain.  She is currently on zosyn. She was started on clear liquid diet, advanced to soft diet today, but RN reports she had an episode of vomiting earlier today after taking potassium supplements.  She will probably need colonoscopy for further evaluation, recommend outpatient followup with gastroenterology on discharge.  She is afebrile and wbc wnl.     Right mid lobe infiltrate: possibly aspiration pneumonia:  Currently on zosyn, .  SLP evaluation to evaluate for aspiration. MBS ordered today by SLP.    Acute  systolic and diastolic heart failure:  Echocardiogram showed LVEF of 20 to 25%, with diffuse wall abnormalities.  She was started on IV lasix with good diuresis about 2.1 lit since admission.  cardilogy consulted and appreciate recommendations. Cardiology started the patient on spironolactone and to continue with BB.  Replace potassium as needed.  Magnesium level is adequate.  Creatinine stable .    Severe hypokalemia:  Replace with oral potassium.    Hypertension Well controlled.    Hyperlipidemia:  Resume lipitor.    Diabetes Mellitus: CBG (last 3)  Recent Labs    08/14/18 0434 08/14/18 0813 08/14/18 1218  GLUCAP 142* 144* 293*   Resume SSI.  Resume home meds on discharge.    H/o LV thrombus and h/o of aFIB:  Rate at 110 to 120/min. On coumadin for anti coagulation and INR is therapeutic.   Tachycardia:  Probably from enteritis and aspiration pneumonia, on BB.   Moderate Malnutrition:  Nutrition consulted and PT evaluation ordered, recommending SNF, but patient refusing SNF.   DVT prophylaxis: coumadin.  Code Status: full code.  Family Communication:  None at bedside.  Disposition Plan: Home with Home health when able to tolerate soft diet without any  Nausea or vomiting.   Consultants:   Cardiology.   SLP   Procedures: none.   Antimicrobials: zosyn.   Subjective: She had one episode of vomiting with potassium supplements.  abd pain has improved.  She is on RA and denies sob or chest pain.  She is absolutely refusing to go to SNF. Wants to go home on discharge.    Objective: Vitals:   08/13/18 1736 08/13/18 2225 08/14/18 7893 08/14/18 8101  BP: 131/78 121/78 123/76 121/77  Pulse: (!) 118 (!) 115 (!) 125 (!) 111  Resp:   20 18  Temp: 98.5 F (36.9 C) 98.5 F (36.9 C) 98.6 F (37 C) 98.2 F (36.8 C)  TempSrc: Oral Oral Oral Oral  SpO2:  96% 98% 96%  Weight:      Height:        Intake/Output Summary (Last 24 hours) at 08/14/2018  1311 Last data filed at 08/14/2018 0315 Gross per 24 hour  Intake 995.23 ml  Output -  Net 995.23 ml   Filed Weights   08/09/18 2130 08/10/18 2032  Weight: 49.9 kg 49.9 kg    Examination:  General exam: calm, off oxygen. Frail.  Respiratory system: diminished at bases. No wheezing or rhonchi.  Cardiovascular system: S1 & S2 heard, tachycardic, no JVD or murmer.   Gastrointestinal system: Abdomen is soft non tender non distended bowel sounds good.  Central nervous system: Alert and oriented. Non focal.  Extremities: no pedal edema. Able to move all extremities.  Skin: No rashes, lesions or ulcers Psychiatry: Mood & affect appropriate.     Data Reviewed: I have personally reviewed following labs and imaging studies  CBC: Recent Labs  Lab 08/09/18 1321 08/10/18 0211 08/11/18 0636 08/12/18 0637 08/14/18 0712  WBC 6.9 7.6 11.9* 9.8 7.6  NEUTROABS  --  6.4  --   --   --   HGB 12.9 12.5 13.0 14.2 14.1  HCT 42.0 40.3 40.2 44.5 44.3  MCV 70.4* 69.4* 67.7* 67.5* 67.8*  PLT 291 261 279 268 096   Basic Metabolic Panel: Recent Labs  Lab 08/10/18 0211 08/11/18 0636 08/12/18 0637 08/13/18 0645 08/13/18 1927 08/14/18 0712  NA 142 141 139 139  --  141  K 3.6 2.7* 3.1* 2.7* 3.3* 3.2*  CL 107 100 99 95*  --  95*  CO2 25 24 26  32  --  34*  GLUCOSE 172* 159* 135* 151*  --  151*  BUN 12 12 9 11   --  13  CREATININE 0.87 0.90 0.81 0.88  --  0.98  CALCIUM 9.1 9.3 8.7* 9.0  --  9.2  MG  --  1.7  --  1.9  --   --    GFR: Estimated Creatinine Clearance: 46.3 mL/min (by C-G formula based on SCr of 0.98 mg/dL). Liver Function Tests: Recent Labs  Lab 08/09/18 1321 08/10/18 0211  AST 29 33  ALT 32 39  ALKPHOS 41 40  BILITOT 1.1 1.1  PROT 6.9 7.0  ALBUMIN 3.8 3.6   Recent Labs  Lab 08/09/18 1321  LIPASE 30   No results for input(s): AMMONIA in the last 168 hours. Coagulation Profile: Recent Labs  Lab 08/10/18 0211 08/11/18 0636 08/12/18 0637 08/13/18 0645  08/14/18 0712  INR 2.30 2.71 2.63 2.59 2.03   Cardiac Enzymes: Recent Labs  Lab 08/09/18 1643  TROPONINI <0.03   BNP (last 3 results) No results for input(s): PROBNP in the last 8760 hours. HbA1C: No results for input(s): HGBA1C in the last 72 hours. CBG: Recent Labs  Lab 08/13/18 2019 08/13/18 2343 08/14/18 0434 08/14/18 0813 08/14/18 1218  GLUCAP 182* 170* 142* 144* 293*   Lipid Profile: No results for input(s): CHOL, HDL, LDLCALC, TRIG, CHOLHDL, LDLDIRECT in the last 72 hours. Thyroid Function Tests: No results for input(s): TSH, T4TOTAL, FREET4, T3FREE, THYROIDAB in the last 72 hours. Anemia Panel: No results for input(s): VITAMINB12, FOLATE, FERRITIN, TIBC, IRON, RETICCTPCT in the last 72 hours.  Sepsis Labs: Recent Labs  Lab 08/10/18 0211 08/10/18 0532 08/10/18 1338 08/11/18 1142  LATICACIDVEN 2.0* 2.1* 2.0* 1.8    Recent Results (from the past 240 hour(s))  Blood culture (routine x 2)     Status: None   Collection Time: 08/09/18  4:42 PM  Result Value Ref Range Status   Specimen Description BLOOD LEFT FOREARM  Final   Special Requests   Final    BOTTLES DRAWN AEROBIC ONLY Blood Culture results may not be optimal due to an excessive volume of blood received in culture bottles   Culture   Final    NO GROWTH 5 DAYS Performed at [facility], [place] [address] [place], [place] [address]    Report Status 08/14/2018 FINAL  Final  Blood culture (routine x 2)     Status: None   Collection Time: 08/09/18  4:44 PM  Result Value Ref Range Status   Specimen Description BLOOD RIGHT ANTECUBITAL  Final   Special Requests   Final    BOTTLES DRAWN AEROBIC AND ANAEROBIC Blood Culture adequate volume   Culture   Final    NO GROWTH 5 DAYS Performed at [facility], [place] [address], [place], [place] [address]    Report Status 08/14/2018 FINAL  Final         Radiology Studies: No results found.      Scheduled Meds: . feeding supplement (ENSURE  ENLIVE)  237 mL Oral TID BM  . insulin aspart  0-9 Units Subcutaneous Q4H  . metoCLOPramide (REGLAN) injection  5 mg Intravenous Q8H  . metoprolol tartrate  75 mg Oral BID  . potassium chloride  40 mEq Oral Once  . potassium chloride  40 mEq Oral Daily  . spironolactone  12.5 mg Oral Daily  . warfarin  7.5 mg Oral ONCE-1800  . Warfarin - Pharmacist Dosing Inpatient   Does not apply q1800   Continuous Infusions: . piperacillin-tazobactam (ZOSYN)  IV 3.375 g (08/14/18 0910)     LOS: 3 days    Time spent: 35 minutes.     Hosie Poisson, MD [facility] Pager [phone] If 7PM-7AM, please contact night-coverage [email or web address] Password [email or web address] 08/14/2018, 1:11 PM

## 2018-08-14 NOTE — Progress Notes (Signed)
ANTICOAGULATION CONSULT NOTE --Follow up  Pharmacy Consult for Warfarin, Hep if INR<2 Indication: hx LV thrombus/CVA  No Known Allergies  Patient Measurements: Height: 5\' 3"  (160 cm) Weight: 109 lb 15.8 oz (49.9 kg) IBW/kg (Calculated) : 52.4 Heparin Dosing Weight: 49.9 kg   Vital Signs: Temp: 98.2 F (36.8 C) (09/20 0816) Temp Source: Oral (09/20 0816) BP: 121/77 (09/20 0816) Pulse Rate: 111 (09/20 0816)  Labs: Recent Labs    08/12/18 0637 08/13/18 0645 08/14/18 0712  HGB 14.2  --  14.1  HCT 44.5  --  44.3  PLT 268  --  264  LABPROT 27.9* 27.5* 22.8*  INR 2.63 2.59 2.03  CREATININE 0.81 0.88 0.98    Estimated Creatinine Clearance: 46.3 mL/min (by C-G formula based on SCr of 0.98 mg/dL).   Medications:  Scheduled:  . feeding supplement (ENSURE ENLIVE)  237 mL Oral TID BM  . insulin aspart  0-9 Units Subcutaneous Q4H  . metoCLOPramide (REGLAN) injection  5 mg Intravenous Q8H  . metoprolol tartrate  25 mg Oral Daily  . metoprolol tartrate  75 mg Oral BID  . potassium chloride  40 mEq Oral Daily  . spironolactone  12.5 mg Oral Daily  . Warfarin - Pharmacist Dosing Inpatient   Does not apply q1800    Assessment: 57 YOF  presenting with mid-lower abdominal pain and nausea/vomiting x1-2 days. On warfarin PTA for hx of LV thrombus/CVA. Home regimen is 5 mg daily except 7.5 mg on Monday/Friday. INR on admission was 2.37. Warfarin held initially while NPO, then pharmacy consulted to resume on 9/16 evening.   9/19: INR today is therapeutic 2.03, but down from 2.59 yesterday, goal of 2-3. Decrease likely due to recent increases in PO intake. Will return to PTA dosing for now and follow. CBC WNL and stable.   Goal of Therapy:  INR 2-3 Monitor platelets by anticoagulation protocol: Yes   Plan:  - Warfarin 7.5 x 1 dose at 1800 today - Will continue to monitor INR and any signs/symptoms of bleeding   Thank you for involving pharmacy in this patient's care.  Janae Bridgeman, PharmD PGY1 Pharmacy Resident Phone: [phone] 08/14/2018 10:03 AM

## 2018-08-14 NOTE — Progress Notes (Signed)
Modified Barium Swallow Progress Note  Patient Details  Name: Kristen Lozano MRN: [id number] Date of Birth: July 20, 1956  Today's Date: 08/14/2018  Modified Barium Swallow completed.  Full report located under Chart Review in the Imaging Section.  Brief recommendations include the following:  Clinical Impression  Pt exhibited moderate oral and mild pharyneal dysphagia marked by delayed transit and flash penetration. With thicker textures (puree, graham cracker) oral transit was delayed, significant difficulty propelling boluses lingual rocking motion and prolonged mastication with cracker. Initiation of swallow occured primarily at the pyriforms and vallecule with slight incomplete laryngeal closure with barium entering laryngeal vestibule and exiting during the swallow. No pharyngeal residue observed however of significant note, barium passed UES without difficulty but then reappeared ascending cervical esophagus into pyriform sinuses (suspicious for GERD). MBS does not diagnose below the level of the UES however esophageal scan with subsequent swallows did not reveal overt abnormalities. Pt with neurological dysphagia likely from prior CVA however suspect mild behavioral component and suspect GERD. Would benefit from further work up for esophageal impairment   Swallow Evaluation Recommendations   Recommended Consults: Consider GI evaluation   SLP Diet Recommendations: Dysphagia 3 (Mech soft) solids;Thin liquid   Liquid Administration via: Straw;Cup   Medication Administration: Whole meds with liquid   Supervision: Patient able to self feed   Compensations: Small sips/bites;Slow rate   Postural Changes: Seated upright at 90 degrees;Remain semi-upright after after feeds/meals (Comment)   Oral Care Recommendations: Oral care BID        Houston Siren 08/14/2018,3:25 PM

## 2018-08-14 NOTE — Evaluation (Signed)
Occupational Therapy Evaluation Patient Details Name: Kristen Lozano MRN: [id number] DOB: 1956/04/22 Today's Date: 08/14/2018    History of Present Illness Pt is a 62 y.o. F with significant PMH of LV thrombus and CVA, history of hypertension, diabetes type 2, presenting to ER with persistent nausea and vomiting over last 4 days, accompanied by lower abdominal pain without diarrhea.    Clinical Impression   Pt admitted with the above diagnoses and presents with below problem list. Pt will benefit from continued acute OT to address the below listed deficits and maximize independence with basic ADLs prior to d/c home. PTA pt was mod I with basic ADLs, family/friends assisting with IADLs (grocery shopping, finances, transportation, etc). She does report a h/o falls. Pt is currently min to mod A with LB ADLs, toilet and shower transfers, and functional mobility. Of note, pt with 1 LOB walking out of bathroom needing mod A to prevent a fall. Pt verbalizing that she wants to d/c home. Unsure if assist is available in the home.     Follow Up Recommendations  SNF;Supervision/Assistance - 24 hour    Equipment Recommendations  Other (comment)(defer to next venue)    Recommendations for Other Services       Precautions / Restrictions Precautions Precautions: Fall Restrictions Weight Bearing Restrictions: No      Mobility Bed Mobility Overal bed mobility: Needs Assistance Bed Mobility: Supine to Sit;Sit to Supine     Supine to sit: Min guard Sit to supine: Supervision   General bed mobility comments: reaching for therapist hand during trunk elevation  Transfers Overall transfer level: Needs assistance Equipment used: 1 person hand held assist Transfers: Sit to/from Stand Sit to Stand: Min guard         General transfer comment: +1 HHA in lieu of cane    Balance Overall balance assessment: Needs assistance Sitting-balance support: No upper extremity supported;Feet  supported Sitting balance-Leahy Scale: Good     Standing balance support: Single extremity supported;No upper extremity supported Standing balance-Leahy Scale: Poor Standing balance comment: external support for static standing                           ADL either performed or assessed with clinical judgement   ADL Overall ADL's : Needs assistance/impaired Eating/Feeding: Set up;Sitting   Grooming: Set up;Sitting;Standing;Min guard Grooming Details (indicate cue type and reason): min guard in standing Upper Body Bathing: Set up;Sitting   Lower Body Bathing: Moderate assistance;Sit to/from stand   Upper Body Dressing : Set up;Sitting   Lower Body Dressing: Minimal assistance   Toilet Transfer: Minimal assistance;Ambulation;Moderate assistance Toilet Transfer Details (indicate cue type and reason): +1 HHA with 1 LOB turning out of bathroom needing mod A to correct. Toileting- Clothing Manipulation and Hygiene: Minimal assistance;Sit to/from stand   Tub/ Shower Transfer: Tub transfer;Moderate assistance;Ambulation   Functional mobility during ADLs: Minimal assistance;Moderate assistance General ADL Comments: Pt completed bed mobility, toilet transfer, pericare, grooming task standing at sink. +1 HHA provided in lieu of cane (baseline). 1 LOB walking back from bathroom needing mod A to prevent fall     Vision         Perception     Praxis      Pertinent Vitals/Pain Pain Assessment: Faces Pain Score: 0-No pain Faces Pain Scale: Hurts little more Pain Intervention(s): Monitored during session     Hand Dominance Right   Extremity/Trunk Assessment Upper Extremity Assessment Upper Extremity Assessment: Generalized weakness  Lower Extremity Assessment Lower Extremity Assessment: Generalized weakness       Communication Communication Communication: No difficulties   Cognition Arousal/Alertness: Awake/alert Behavior During Therapy: Flat affect Overall  Cognitive Status: Impaired/Different from baseline Area of Impairment: Orientation;Attention;Memory;Following commands;Safety/judgement;Awareness;Problem solving                 Orientation Level: Disoriented to;Time;Situation Current Attention Level: Sustained Memory: Decreased short-term memory Following Commands: Follows one step commands inconsistently;Follows one step commands with increased time Safety/Judgement: Decreased awareness of safety;Decreased awareness of deficits Awareness: Intellectual Problem Solving: Slow processing;Requires verbal cues;Requires tactile cues General Comments: Per chart review, pt has lower education/comprehension level at baseline with poor insight into medical issues. States year is [place].   General Comments       Exercises     Shoulder Instructions      Home Living Family/patient expects to be discharged to:: Private residence Living Arrangements: Alone   Type of Home: House Home Access: Level entry     Home Layout: One level     Bathroom Shower/Tub: Tub/shower unit         Home Equipment: Cane - single point;Shower seat          Prior Functioning/Environment Level of Independence: Needs assistance  Gait / Transfers Assistance Needed: pt reports she walks with a cane in the house     Comments: Pt is a poor historian. Per PT note,  States her ex husband performs IADL's i.e. grocery shopping, transportation to appointments and she is independent with ADL's. Endorses 3 falls recently, "in the yard."        OT Problem List: Impaired balance (sitting and/or standing);Decreased activity tolerance;Decreased strength;Decreased cognition;Decreased safety awareness;Decreased knowledge of use of DME or AE;Decreased knowledge of precautions      OT Treatment/Interventions: Self-care/ADL training;Energy conservation;DME and/or AE instruction;Therapeutic activities;Cognitive remediation/compensation;Patient/family education;Balance  training    OT Goals(Current goals can be found in the care plan section) Acute Rehab OT Goals Patient Stated Goal: home OT Goal Formulation: With patient Time For Goal Achievement: 08/28/18 Potential to Achieve Goals: Good ADL Goals Pt Will Perform Grooming: with modified independence;sitting;standing Pt Will Perform Lower Body Bathing: with modified independence;sit to/from stand Pt Will Perform Lower Body Dressing: with modified independence;sit to/from stand Pt Will Transfer to Toilet: with modified independence;ambulating Pt Will Perform Toileting - Clothing Manipulation and hygiene: with modified independence;sit to/from stand Pt Will Perform Tub/Shower Transfer: with supervision;ambulating;shower seat  OT Frequency: Min 2X/week   Barriers to D/C: Decreased caregiver support  lives alone       Co-evaluation              AM-PAC PT "6 Clicks" Daily Activity     Outcome Measure Help from another person eating meals?: None Help from another person taking care of personal grooming?: None Help from another person toileting, which includes using toliet, bedpan, or urinal?: A Lot Help from another person bathing (including washing, rinsing, drying)?: A Little Help from another person to put on and taking off regular upper body clothing?: None Help from another person to put on and taking off regular lower body clothing?: A Little 6 Click Score: 20   End of Session Equipment Utilized During Treatment: Gait belt  Activity Tolerance: Patient tolerated treatment well Patient left: in bed;with bed alarm set  OT Visit Diagnosis: Unsteadiness on feet (R26.81);History of falling (Z91.81);Muscle weakness (generalized) (M62.81);Other abnormalities of gait and mobility (R26.89);Other symptoms and signs involving cognitive function  Time: 9643-8381 OT Time Calculation (min): 17 min Charges:  OT General Charges $OT Visit: 1 Visit OT Evaluation $OT Eval Low Complexity:  [place], OT Acute Rehabilitation Services Pager: [phone] Office: [phone]   Hortencia Pilar 08/14/2018, 10:33 AM

## 2018-08-15 DIAGNOSIS — K529 Noninfective gastroenteritis and colitis, unspecified: Secondary | ICD-10-CM

## 2018-08-15 DIAGNOSIS — R1030 Lower abdominal pain, unspecified: Secondary | ICD-10-CM

## 2018-08-15 DIAGNOSIS — I10 Essential (primary) hypertension: Secondary | ICD-10-CM

## 2018-08-15 DIAGNOSIS — R918 Other nonspecific abnormal finding of lung field: Secondary | ICD-10-CM

## 2018-08-15 DIAGNOSIS — R112 Nausea with vomiting, unspecified: Secondary | ICD-10-CM

## 2018-08-15 DIAGNOSIS — I236 Thrombosis of atrium, auricular appendage, and ventricle as current complications following acute myocardial infarction: Secondary | ICD-10-CM

## 2018-08-15 DIAGNOSIS — I5042 Chronic combined systolic (congestive) and diastolic (congestive) heart failure: Secondary | ICD-10-CM

## 2018-08-15 LAB — GLUCOSE, CAPILLARY
GLUCOSE-CAPILLARY: 140 mg/dL — AB (ref 70–99)
GLUCOSE-CAPILLARY: 196 mg/dL — AB (ref 70–99)
Glucose-Capillary: 151 mg/dL — ABNORMAL HIGH (ref 70–99)
Glucose-Capillary: 132 mg/dL — ABNORMAL HIGH (ref 70–99)
Glucose-Capillary: 325 mg/dL — ABNORMAL HIGH (ref 70–99)
Glucose-Capillary: 246 mg/dL — ABNORMAL HIGH (ref 70–99)

## 2018-08-15 LAB — PROTIME-INR
INR: 1.98
PROTHROMBIN TIME: 22.3 s — AB (ref 11.4–15.2)

## 2018-08-15 LAB — BASIC METABOLIC PANEL
Anion gap: 14 (ref 5–15)
BUN: 10 mg/dL (ref 8–23)
CALCIUM: 9.3 mg/dL (ref 8.9–10.3)
CO2: 28 mmol/L (ref 22–32)
CREATININE: 0.81 mg/dL (ref 0.44–1.00)
Chloride: 99 mmol/L (ref 98–111)
GFR calc Af Amer: >60 mL/min (ref >60)
GLUCOSE: 152 mg/dL — AB (ref 70–99)
POTASSIUM: 3.4 mmol/L — AB (ref 3.5–5.1)
Sodium: 141 mmol/L (ref 135–145)

## 2018-08-15 MED ORDER — METOCLOPRAMIDE HCL 5 MG/5ML PO SOLN
10.0000 mg | Freq: Three times a day (TID) | ORAL | Status: DC
  Filled 2018-08-15: qty 10

## 2018-08-15 MED ORDER — METOCLOPRAMIDE HCL 10 MG/10ML PO SOLN
10.0000 mg | Freq: Three times a day (TID) | ORAL | Status: DC
  Administered 2018-08-16 – 2018-08-17 (×6): 10 mg via ORAL
  Filled 2018-08-15 (×7): qty 10

## 2018-08-15 MED ORDER — WARFARIN SODIUM 7.5 MG PO TABS
7.5000 mg | ORAL_TABLET | Freq: Once | ORAL | Status: AC
  Administered 2018-08-15: 7.5 mg via ORAL
  Filled 2018-08-15: qty 1

## 2018-08-15 NOTE — Progress Notes (Signed)
PROGRESS NOTE  Kristen Lozano NLZ:[id number] DOB: [birth year] DOA: 08/09/2018 PCP: Audley Hose, MD  HPI/Brief Narrative  Kristen Lozano is a 62 y.o. year old female with medical history significant for history of LV thrombus and CVA, on anticoagulation, history of hypertension, diabetes type 2 who presented on 08/09/2018 with nausea, vomiting for several days and abdominal pain without diarrhea and was found to have left lower quadrant inflammatory changes on CT abdomen concerning for enteritis initially empirically treated with azithromycin and ceftriaxone then transition into Zosyn (completed 6 days of 7).  Subjective Did well with lunch today  Assessment/Plan:  Small bowel enteritis, improving.  Today's first day tolerating some oral intake once avoid starting any medications they can make to go backwards we will continue IV Zosyn for 1 additional day to complete therapy on 9/22.  Continue supportive care with antiemetics, transition to oral regimen  Also concern for aspiration pneumonia given right middle lobe infiltrate on chest x-ray.  No cough, no fevers, no signs of sepsis.  Zosyn will treat as well.  Speech evaluation dysphagia 3/mechanical soft diet, concern for GERD based on evaluation will likely need further GI evaluation for esophageal impairment.  Acute combined systolic and diastolic heart failure, improving.  TTE showed EF of 20-25% with diffuse wall abnormalities had good diuresis throughout hospital stay with IV Lasix now holding off on further diuresis given patient seems to be a bit more dry.  Appreciate cardiology recommendations we will continue spironolactone plan to increase dose on discharge  Severe hypokalemia, improving, continue to monitor BMP expect some improvement with initiation of spironolactone continue oral repletion.  Hypertension, at goal  History of LV thrombus and history of A. fib.  INR today 1.8 has been therapy throughout hospital stay will  repeat INR, appreciate pharmacy management  Code Status: Full code  Family Communication: Husband at bedside  Disposition Plan: Continue tolerates oral diet, stabilization of potassium, completion of IV Zosyn.   Consultants: cardiology    Antimicrobials: Anti-infectives (From admission, onward)   Start     Dose/Rate Route Frequency Ordered Stop   08/10/18 0700  piperacillin-tazobactam (ZOSYN) IVPB 3.375 g     3.375 g 12.5 mL/hr over 240 Minutes Intravenous Every 8 hours 08/09/18 2145 08/17/18 0659   08/09/18 2300  piperacillin-tazobactam (ZOSYN) IVPB 3.375 g     3.375 g 100 mL/hr over 30 Minutes Intravenous  Once 08/09/18 2143 08/10/18 0023   08/09/18 1700  cefTRIAXone (ROCEPHIN) 1 g in sodium chloride 0.9 % 100 mL IVPB     1 g 200 mL/hr over 30 Minutes Intravenous  Once 08/09/18 1655 08/09/18 1908   08/09/18 1700  azithromycin (ZITHROMAX) 500 mg in sodium chloride 0.9 % 250 mL IVPB     500 mg 250 mL/hr over 60 Minutes Intravenous  Once 08/09/18 1655 08/09/18 2109         Cultures:  9/15, blood cultures x2 neg  Telemetry:yes  DVT prophylaxis: warfarin   Objective: Vitals:   08/14/18 1633 08/14/18 2127 08/15/18 0418 08/15/18 0942  BP: 107/73 134/89 116/85 110/73  Pulse: (!) 128 (!) 124 (!) 115 (!) 119  Resp:  16 16 16   Temp: 98.7 F (37.1 C) 98.5 F (36.9 C) 98.3 F (36.8 C) (!) 97.3 F (36.3 C)  TempSrc: Oral Oral Oral Oral  SpO2: 99% 98% 98% 99%  Weight:      Height:        Intake/Output Summary (Last 24 hours) at 08/15/2018 1540 Last data  filed at 08/15/2018 1400 Gross per 24 hour  Intake 910.79 ml  Output 0 ml  Net 910.79 ml   Filed Weights   08/09/18 2130 08/10/18 2032  Weight: 49.9 kg 49.9 kg    Exam:  Constitutional:thin, cachectic female Eyes: EOMI, anicteric, normal conjunctivae ENMT: Oropharynx with moist mucous membranes, normal dentition Cardiovascular: RRR no MRGs, with no peripheral edema Respiratory: Normal respiratory  effort, clear breath sounds  Abdomen: Soft,non-tender,  Skin: No rash ulcers, or lesions. Without skin tenting  Neurologic: Grossly no focal neuro deficit. Psychiatric:Appropriate affect, and mood. Mental status AAOx3  Data Reviewed: CBC: Recent Labs  Lab 08/09/18 1321 08/10/18 0211 08/11/18 0636 08/12/18 0637 08/14/18 0712  WBC 6.9 7.6 11.9* 9.8 7.6  NEUTROABS  --  6.4  --   --   --   HGB 12.9 12.5 13.0 14.2 14.1  HCT 42.0 40.3 40.2 44.5 44.3  MCV 70.4* 69.4* 67.7* 67.5* 67.8*  PLT 291 261 279 268 409   Basic Metabolic Panel: Recent Labs  Lab 08/11/18 0636 08/12/18 0637 08/13/18 0645 08/13/18 1927 08/14/18 0712 08/15/18 0452  NA 141 139 139  --  141 141  K 2.7* 3.1* 2.7* 3.3* 3.2* 3.4*  CL 100 99 95*  --  95* 99  CO2 24 26 32  --  34* 28  GLUCOSE 159* 135* 151*  --  151* 152*  BUN 12 9 11   --  13 10  CREATININE 0.90 0.81 0.88  --  0.98 0.81  CALCIUM 9.3 8.7* 9.0  --  9.2 9.3  MG 1.7  --  1.9  --   --   --    GFR: Estimated Creatinine Clearance: 56 mL/min (by C-G formula based on SCr of 0.81 mg/dL). Liver Function Tests: Recent Labs  Lab 08/09/18 1321 08/10/18 0211  AST 29 33  ALT 32 39  ALKPHOS 41 40  BILITOT 1.1 1.1  PROT 6.9 7.0  ALBUMIN 3.8 3.6   Recent Labs  Lab 08/09/18 1321  LIPASE 30   No results for input(s): AMMONIA in the last 168 hours. Coagulation Profile: Recent Labs  Lab 08/11/18 0636 08/12/18 0637 08/13/18 0645 08/14/18 0712 08/15/18 0452  INR 2.71 2.63 2.59 2.03 1.98   Cardiac Enzymes: Recent Labs  Lab 08/09/18 1643  TROPONINI <0.03   BNP (last 3 results) No results for input(s): PROBNP in the last 8760 hours. HbA1C: No results for input(s): HGBA1C in the last 72 hours. CBG: Recent Labs  Lab 08/14/18 2046 08/15/18 0103 08/15/18 0416 08/15/18 0745 08/15/18 1209  GLUCAP 251* 151* 140* 132* 325*   Lipid Profile: No results for input(s): CHOL, HDL, LDLCALC, TRIG, CHOLHDL, LDLDIRECT in the last 72 hours. Thyroid  Function Tests: No results for input(s): TSH, T4TOTAL, FREET4, T3FREE, THYROIDAB in the last 72 hours. Anemia Panel: No results for input(s): VITAMINB12, FOLATE, FERRITIN, TIBC, IRON, RETICCTPCT in the last 72 hours. Urine analysis:    Component Value Date/Time   COLORURINE YELLOW 08/09/2018 1328   APPEARANCEUR HAZY (A) 08/09/2018 1328   LABSPEC 1.023 08/09/2018 1328   PHURINE 5.0 08/09/2018 1328   GLUCOSEU NEGATIVE 08/09/2018 1328   HGBUR SMALL (A) 08/09/2018 1328   BILIRUBINUR NEGATIVE 08/09/2018 1328   KETONESUR 5 (A) 08/09/2018 1328   PROTEINUR >=300 (A) 08/09/2018 1328   UROBILINOGEN 1.0 09/07/2014 1405   NITRITE NEGATIVE 08/09/2018 1328   LEUKOCYTESUR TRACE (A) 08/09/2018 1328   Sepsis Labs: @LABRCNTIP (procalcitonin:4,lacticidven:4)  ) Recent Results (from the past 240 hour(s))  Blood culture (routine  x 2)     Status: None   Collection Time: 08/09/18  4:42 PM  Result Value Ref Range Status   Specimen Description BLOOD LEFT FOREARM  Final   Special Requests   Final    BOTTLES DRAWN AEROBIC ONLY Blood Culture results may not be optimal due to an excessive volume of blood received in culture bottles   Culture   Final    NO GROWTH 5 DAYS Performed at [facility], [place] [address], [place], [place] [address]    Report Status 08/14/2018 FINAL  Final  Blood culture (routine x 2)     Status: None   Collection Time: 08/09/18  4:44 PM  Result Value Ref Range Status   Specimen Description BLOOD RIGHT ANTECUBITAL  Final   Special Requests   Final    BOTTLES DRAWN AEROBIC AND ANAEROBIC Blood Culture adequate volume   Culture   Final    NO GROWTH 5 DAYS Performed at [facility], [place] [address] [place],  [address]    Report Status 08/14/2018 FINAL  Final      Studies: No results found.  Scheduled Meds: . feeding supplement (ENSURE ENLIVE)  237 mL Oral TID BM  . insulin aspart  0-9 Units Subcutaneous Q4H  . metoCLOPramide  10 mg Oral TID AC  .  metoprolol tartrate  75 mg Oral BID  . pantoprazole  40 mg Oral Q0600  . spironolactone  12.5 mg Oral Daily  . warfarin  7.5 mg Oral ONCE-1800  . Warfarin - Pharmacist Dosing Inpatient   Does not apply q1800    Continuous Infusions: . piperacillin-tazobactam (ZOSYN)  IV 3.375 g (08/15/18 1434)     LOS: 4 days     Desiree Hane, MD [facility] Pager [phone]  If 7PM-7AM, please contact night-coverage [email or web address] Password [facility] 08/15/2018, 3:40 PM

## 2018-08-15 NOTE — Progress Notes (Signed)
ANTICOAGULATION CONSULT NOTE --Follow up  Pharmacy Consult for Warfarin, Hep if INR<2 Indication: hx LV thrombus/CVA  No Known Allergies  Patient Measurements: Height: 5' 3"  (160 cm) Weight: 109 lb 15.8 oz (49.9 kg) IBW/kg (Calculated) : 52.4 Heparin Dosing Weight: 49.9 kg   Vital Signs: Temp: 97.3 F (36.3 C) (09/21 0942) Temp Source: Oral (09/21 0942) BP: 110/73 (09/21 0942) Pulse Rate: 119 (09/21 0942)  Labs: Recent Labs    08/13/18 0645 08/14/18 0712 08/15/18 0452  HGB  --  14.1  --   HCT  --  44.3  --   PLT  --  264  --   LABPROT 27.5* 22.8* 22.3*  INR 2.59 2.03 1.98  CREATININE 0.88 0.98 0.81    Estimated Creatinine Clearance: 56 mL/min (by C-G formula based on SCr of 0.81 mg/dL).   Medications:  Scheduled:  . feeding supplement (ENSURE ENLIVE)  237 mL Oral TID BM  . insulin aspart  0-9 Units Subcutaneous Q4H  . metoCLOPramide (REGLAN) injection  5 mg Intravenous Q8H  . metoprolol tartrate  75 mg Oral BID  . pantoprazole  40 mg Oral Q0600  . spironolactone  12.5 mg Oral Daily  . warfarin  7.5 mg Oral ONCE-1800  . Warfarin - Pharmacist Dosing Inpatient   Does not apply q1800    Assessment: 20 YOF  presenting with mid-lower abdominal pain and nausea/vomiting x1-2 days. On warfarin PTA for hx of LV thrombus/CVA. Home regimen is 5 mg daily except 7.5 mg on Monday/Friday. INR on admission was 2.37. Warfarin held initially while NPO, then pharmacy consulted to resume on 9/16 evening.   INR 1.98 now.  We will repeat the 7.64m dose.   Goal of Therapy:  INR 2-3 Monitor platelets by anticoagulation protocol: Yes   Plan:  - Warfarin 7.5 x 1 dose at 1800 today - Will continue to monitor INR and any signs/symptoms of bleeding  Consider stopping Zosyn at 7 days  MOnnie Boer PharmD, [place]owder Horn AAHIVP, CPP Infectious Disease Pharmacist Pager: [phone]/21/2019 11:22 AM

## 2018-08-16 DIAGNOSIS — E440 Moderate protein-calorie malnutrition: Secondary | ICD-10-CM

## 2018-08-16 DIAGNOSIS — E1159 Type 2 diabetes mellitus with other circulatory complications: Secondary | ICD-10-CM

## 2018-08-16 DIAGNOSIS — R1084 Generalized abdominal pain: Secondary | ICD-10-CM

## 2018-08-16 LAB — GLUCOSE, CAPILLARY
GLUCOSE-CAPILLARY: 206 mg/dL — AB (ref 70–99)
GLUCOSE-CAPILLARY: 221 mg/dL — AB (ref 70–99)
Glucose-Capillary: 124 mg/dL — ABNORMAL HIGH (ref 70–99)
Glucose-Capillary: 133 mg/dL — ABNORMAL HIGH (ref 70–99)
Glucose-Capillary: 213 mg/dL — ABNORMAL HIGH (ref 70–99)
Glucose-Capillary: 266 mg/dL — ABNORMAL HIGH (ref 70–99)

## 2018-08-16 LAB — BASIC METABOLIC PANEL
ANION GAP: 10 (ref 5–15)
BUN: 9 mg/dL (ref 8–23)
CALCIUM: 8.9 mg/dL (ref 8.9–10.3)
CO2: 31 mmol/L (ref 22–32)
Chloride: 98 mmol/L (ref 98–111)
Creatinine, Ser: 0.86 mg/dL (ref 0.44–1.00)
GFR calc Af Amer: >60 mL/min (ref >60)
GFR calc non Af Amer: >60 mL/min (ref >60)
GLUCOSE: 129 mg/dL — AB (ref 70–99)
Potassium: 3.8 mmol/L (ref 3.5–5.1)
Sodium: 139 mmol/L (ref 135–145)

## 2018-08-16 LAB — PROTIME-INR
INR: 2.24
PROTHROMBIN TIME: 24.6 s — AB (ref 11.4–15.2)

## 2018-08-16 LAB — TSH: TSH: 0.9 u[IU]/mL (ref 0.350–4.500)

## 2018-08-16 MED ORDER — WARFARIN SODIUM 7.5 MG PO TABS
7.5000 mg | ORAL_TABLET | ORAL | Status: DC
  Administered 2018-08-17: 7.5 mg via ORAL
  Filled 2018-08-16: qty 1

## 2018-08-16 MED ORDER — WARFARIN SODIUM 5 MG PO TABS
5.0000 mg | ORAL_TABLET | ORAL | Status: DC
  Administered 2018-08-16: 5 mg via ORAL
  Filled 2018-08-16: qty 1

## 2018-08-16 NOTE — Progress Notes (Signed)
PROGRESS NOTE  Kristen Lozano YYT:[id number] DOB: 03-Mar-1956 DOA: 08/09/2018 PCP: Audley Hose, MD  HPI/Brief Narrative  Kristen Lozano is a 62 y.o. year old female with medical history significant for history of LV thrombus and CVA, on anticoagulation, history of hypertension, diabetes type 2 who presented on 08/09/2018 with nausea, vomiting for several days and abdominal pain without diarrhea and was found to have left lower quadrant inflammatory changes on CT abdomen concerning for enteritis initially empirically treated with azithromycin and ceftriaxone then transition into Zosyn (completed 6 days of 7).  Subjective Feels great. Eating dinner. No emesis.   Assessment/Plan:  Sinus Tachycardia. Likely related to dehydration given poor PO intake though her appetite has increased. Check TSH to ensure.   Small bowel enteritis, improving.  9/22 last day to complete total 7 days of IV zosyn.  Continue supportive care with antiemetics, monitor oral intake  Aspiration pneumonia, resolved given right middle lobe infiltrate on chest x-ray.  No cough, no fevers, no signs of sepsis.  Zosyn will treat as well, last day of course on 9/22.  Speech evaluation recs: dysphagia 3/mechanical soft diet, concern for GERD based on evaluation will likely need further GI evaluation for esophageal impairment.  Acute combined systolic and diastolic heart failure, improving.  TTE showed EF of 20-25% with diffuse wall abnormalities had good diuresis throughout hospital stay with IV Lasix. Now holding off on further diuresis given patient seems to be a bit more dry.  Appreciate cardiology recommendations we will continue spironolactone plan to increase dose on discharge  Severe hypokalemia, improving, continue to monitor BMP expect some improvement with initiation of spironolactone continue oral repletion.  Hypertension, at goal  History of LV thrombus and history of A. fib.  INR therapeutic on 9/22 appreciate  pharmacy management  Code Status: Full code  Family Communication: Husband at bedside  Disposition Plan: Continue tolerates oral diet, stabilization of potassium, completion of IV Zosyn today ( 9/22) anticipate discharge on 9/23 home with home health therapy.   Consultants: cardiology    Antimicrobials: Anti-infectives (From admission, onward)   Start     Dose/Rate Route Frequency Ordered Stop   08/10/18 0700  piperacillin-tazobactam (ZOSYN) IVPB 3.375 g     3.375 g 12.5 mL/hr over 240 Minutes Intravenous Every 8 hours 08/09/18 2145 08/17/18 0659   08/09/18 2300  piperacillin-tazobactam (ZOSYN) IVPB 3.375 g     3.375 g 100 mL/hr over 30 Minutes Intravenous  Once 08/09/18 2143 08/10/18 0023   08/09/18 1700  cefTRIAXone (ROCEPHIN) 1 g in sodium chloride 0.9 % 100 mL IVPB     1 g 200 mL/hr over 30 Minutes Intravenous  Once 08/09/18 1655 08/09/18 1908   08/09/18 1700  azithromycin (ZITHROMAX) 500 mg in sodium chloride 0.9 % 250 mL IVPB     500 mg 250 mL/hr over 60 Minutes Intravenous  Once 08/09/18 1655 08/09/18 2109        Cultures:  9/15, blood cultures x2 neg  Telemetry:yes  DVT prophylaxis: warfarin   Objective: Vitals:   08/15/18 1631 08/15/18 2020 08/16/18 0457 08/16/18 0818  BP: 111/72 101/67 109/83 131/83  Pulse: (!) 118 (!) 118 (!) 104 (!) 105  Resp: 16 18 16 16   Temp: 98.2 F (36.8 C) 98.7 F (37.1 C) 98.1 F (36.7 C) 98.1 F (36.7 C)  TempSrc: Oral Oral Oral Oral  SpO2: 99% 100% 93% 98%  Weight:      Height:        Intake/Output Summary (  Last 24 hours) at 08/16/2018 1634 Last data filed at 08/16/2018 1445 Gross per 24 hour  Intake 570.04 ml  Output 0 ml  Net 570.04 ml   Filed Weights   08/09/18 2130 08/10/18 2032  Weight: 49.9 kg 49.9 kg    Exam:  Constitutional:thin, cachectic female Eyes: EOMI, anicteric, normal conjunctivae ENMT: Oropharynx with moist mucous membranes, normal dentition Cardiovascular: tachycardic, with no  peripheral edema Respiratory: Normal respiratory effort, clear breath sounds  Abdomen: Soft,non-tender,  Skin: No rash ulcers, or lesions. Without skin tenting  Neurologic: Grossly no focal neuro deficit. Psychiatric:Appropriate affect, and mood. Mental status AAOx3  Data Reviewed: CBC: Recent Labs  Lab 08/10/18 0211 08/11/18 0636 08/12/18 0637 08/14/18 0712  WBC 7.6 11.9* 9.8 7.6  NEUTROABS 6.4  --   --   --   HGB 12.5 13.0 14.2 14.1  HCT 40.3 40.2 44.5 44.3  MCV 69.4* 67.7* 67.5* 67.8*  PLT 261 279 268 876   Basic Metabolic Panel: Recent Labs  Lab 08/11/18 0636 08/12/18 0637 08/13/18 0645 08/13/18 1927 08/14/18 0712 08/15/18 0452 08/16/18 0759  NA 141 139 139  --  141 141 139  K 2.7* 3.1* 2.7* 3.3* 3.2* 3.4* 3.8  CL 100 99 95*  --  95* 99 98  CO2 24 26 32  --  34* 28 31  GLUCOSE 159* 135* 151*  --  151* 152* 129*  BUN 12 9 11   --  13 10 9   CREATININE 0.90 0.81 0.88  --  0.98 0.81 0.86  CALCIUM 9.3 8.7* 9.0  --  9.2 9.3 8.9  MG 1.7  --  1.9  --   --   --   --    GFR: Estimated Creatinine Clearance: 52.7 mL/min (by C-G formula based on SCr of 0.86 mg/dL). Liver Function Tests: Recent Labs  Lab 08/10/18 0211  AST 33  ALT 39  ALKPHOS 40  BILITOT 1.1  PROT 7.0  ALBUMIN 3.6   No results for input(s): LIPASE, AMYLASE in the last 168 hours. No results for input(s): AMMONIA in the last 168 hours. Coagulation Profile: Recent Labs  Lab 08/12/18 0637 08/13/18 0645 08/14/18 0712 08/15/18 0452 08/16/18 0510  INR 2.63 2.59 2.03 1.98 2.24   Cardiac Enzymes: Recent Labs  Lab 08/09/18 1643  TROPONINI <0.03   BNP (last 3 results) No results for input(s): PROBNP in the last 8760 hours. HbA1C: No results for input(s): HGBA1C in the last 72 hours. CBG: Recent Labs  Lab 08/15/18 2020 08/16/18 0029 08/16/18 0457 08/16/18 0827 08/16/18 1210  GLUCAP 246* 206* 124* 133* 213*   Lipid Profile: No results for input(s): CHOL, HDL, LDLCALC, TRIG, CHOLHDL,  LDLDIRECT in the last 72 hours. Thyroid Function Tests: No results for input(s): TSH, T4TOTAL, FREET4, T3FREE, THYROIDAB in the last 72 hours. Anemia Panel: No results for input(s): VITAMINB12, FOLATE, FERRITIN, TIBC, IRON, RETICCTPCT in the last 72 hours. Urine analysis:    Component Value Date/Time   COLORURINE YELLOW 08/09/2018 1328   APPEARANCEUR HAZY (A) 08/09/2018 1328   LABSPEC 1.023 08/09/2018 1328   PHURINE 5.0 08/09/2018 1328   GLUCOSEU NEGATIVE 08/09/2018 1328   HGBUR SMALL (A) 08/09/2018 1328   BILIRUBINUR NEGATIVE 08/09/2018 1328   KETONESUR 5 (A) 08/09/2018 1328   PROTEINUR >=300 (A) 08/09/2018 1328   UROBILINOGEN 1.0 09/07/2014 1405   NITRITE NEGATIVE 08/09/2018 1328   LEUKOCYTESUR TRACE (A) 08/09/2018 1328   Sepsis Labs: @LABRCNTIP (procalcitonin:4,lacticidven:4)  ) Recent Results (from the past 240 hour(s))  Blood culture (  routine x 2)     Status: None   Collection Time: 08/09/18  4:42 PM  Result Value Ref Range Status   Specimen Description BLOOD LEFT FOREARM  Final   Special Requests   Final    BOTTLES DRAWN AEROBIC ONLY Blood Culture results may not be optimal due to an excessive volume of blood received in culture bottles   Culture   Final    NO GROWTH 5 DAYS Performed at [facility], [place] [address], [place], [place] [address]    Report Status 08/14/2018 FINAL  Final  Blood culture (routine x 2)     Status: None   Collection Time: 08/09/18  4:44 PM  Result Value Ref Range Status   Specimen Description BLOOD RIGHT ANTECUBITAL  Final   Special Requests   Final    BOTTLES DRAWN AEROBIC AND ANAEROBIC Blood Culture adequate volume   Culture   Final    NO GROWTH 5 DAYS Performed at [facility], [place] [address], [place], [place] [address]    Report Status 08/14/2018 FINAL  Final      Studies: No results found.  Scheduled Meds: . feeding supplement (ENSURE ENLIVE)  237 mL Oral TID BM  . insulin aspart  0-9 Units Subcutaneous Q4H  .  metoCLOPramide  10 mg Oral TID AC  . metoprolol tartrate  75 mg Oral BID  . pantoprazole  40 mg Oral Q0600  . spironolactone  12.5 mg Oral Daily  . warfarin  5 mg Oral Once per day on Sun Tue Wed Thu Sat  . [START ON 08/17/2018] warfarin  7.5 mg Oral Once per day on Mon Fri  . Warfarin - Pharmacist Dosing Inpatient   Does not apply q1800    Continuous Infusions: . piperacillin-tazobactam (ZOSYN)  IV 3.375 g (08/16/18 1546)     LOS: 5 days     Desiree Hane, MD [facility] Pager [phone]  If 7PM-7AM, please contact night-coverage [email or web address] Password [facility] 08/16/2018, 4:34 PM

## 2018-08-16 NOTE — Progress Notes (Signed)
ANTICOAGULATION CONSULT NOTE --Follow up  Pharmacy Consult for Warfarin, Hep if INR<2 Indication: hx LV thrombus/CVA  No Known Allergies  Patient Measurements: Height: 5\' 3"  (160 cm) Weight: 109 lb 15.8 oz (49.9 kg) IBW/kg (Calculated) : 52.4 Heparin Dosing Weight: 49.9 kg   Vital Signs: Temp: 98.1 F (36.7 C) (09/22 0818) Temp Source: Oral (09/22 0818) BP: 131/83 (09/22 0818) Pulse Rate: 105 (09/22 0818)  Labs: Recent Labs    08/14/18 0712 08/15/18 0452 08/16/18 0510 08/16/18 0759  HGB 14.1  --   --   --   HCT 44.3  --   --   --   PLT 264  --   --   --   LABPROT 22.8* 22.3* 24.6*  --   INR 2.03 1.98 2.24  --   CREATININE 0.98 0.81  --  0.86    Estimated Creatinine Clearance: 52.7 mL/min (by C-G formula based on SCr of 0.86 mg/dL).   Medications:  Scheduled:  . feeding supplement (ENSURE ENLIVE)  237 mL Oral TID BM  . insulin aspart  0-9 Units Subcutaneous Q4H  . metoCLOPramide  10 mg Oral TID AC  . metoprolol tartrate  75 mg Oral BID  . pantoprazole  40 mg Oral Q0600  . spironolactone  12.5 mg Oral Daily  . Warfarin - Pharmacist Dosing Inpatient   Does not apply q1800    Assessment: 49 YOF  presenting with mid-lower abdominal pain and nausea/vomiting x1-2 days. On warfarin PTA for hx of LV thrombus/CVA. Home regimen is 5 mg daily except 7.5 mg on Monday/Friday. INR on admission was 2.37. Warfarin held initially while NPO, then pharmacy consulted to resume on 9/16 evening.   INR 2.24 today.  We will try to resume home dose.   Goal of Therapy:  INR 2-3 Monitor platelets by anticoagulation protocol: Yes   Plan:  - Warfarin 5mg  qday except 7.5mg  MF - Will continue to monitor INR and any signs/symptoms of bleeding  - Consider low dose ACEI due to low EF  Onnie Boer, PharmD, Jake Samples, AAHIVP, CPP Infectious Disease Pharmacist Pager: [phone] 08/16/2018 11:09 AM

## 2018-08-16 NOTE — Care Management Note (Addendum)
Case Management Note  Patient Details  Name: TIMIRA BIEDA MRN: [id number] Date of Birth: 10-19-1956  Subjective/Objective:                    Action/Plan:  Spoke w patient at bedside. She confirms that she does not want SNF at DC. She states she has a sister who help provide transportation. She lives at home alone. She has a RW and 3/1. Her house is easily accessible. She would like to use [facility] for [facility] services. Referral made to [facility], clinical liaison.     No other CM needs   Expected Discharge Date:                  Expected Discharge Plan:  [place]  In-House Referral:     Discharge planning Services  CM Consult  Post Acute Care Choice:  Home Health Choice offered to:  Patient  DME Arranged:    DME Agency:     HH Arranged:    [place] Agency:  [place]  Status of Service:  In process, will continue to follow  If discussed at Long Length of Stay Meetings, dates discussed:    Additional Comments:  Carles Collet, RN 08/16/2018, 1:52 PM

## 2018-08-16 NOTE — Clinical Social Work Note (Signed)
CSW spoke with pt at bedside to complete assessment. Pt states she is not going to rehab she is going home. Pt states she has someone at home who can assist her. Pt states she has no stairs to get up in her home. Pt states she is ready to go home. Pt states the address in the chart is correct. RNCM notified.  Clinical Social Worker will sign off for now as social work intervention is no longer needed. Please consult [place] again if new need arises.   [place], [place]

## 2018-08-17 LAB — GLUCOSE, CAPILLARY
GLUCOSE-CAPILLARY: 127 mg/dL — AB (ref 70–99)
GLUCOSE-CAPILLARY: 141 mg/dL — AB (ref 70–99)
GLUCOSE-CAPILLARY: 146 mg/dL — AB (ref 70–99)
Glucose-Capillary: 208 mg/dL — ABNORMAL HIGH (ref 70–99)
Glucose-Capillary: 302 mg/dL — ABNORMAL HIGH (ref 70–99)

## 2018-08-17 LAB — PROTIME-INR
INR: 2.3
PROTHROMBIN TIME: 25.1 s — AB (ref 11.4–15.2)

## 2018-08-17 MED ORDER — METOCLOPRAMIDE HCL 10 MG/10ML PO SOLN: 10.0000 mg | Freq: Three times a day (TID) | ORAL | 0 refills | Status: DC

## 2018-08-17 MED ORDER — METOPROLOL TARTRATE 75 MG PO TABS: 75.0000 mg | ORAL_TABLET | Freq: Two times a day (BID) | ORAL | 0 refills | Status: DC

## 2018-08-17 MED ORDER — SPIRONOLACTONE 25 MG PO TABS: 25.0000 mg | ORAL_TABLET | Freq: Every day | ORAL | 0 refills | Status: DC

## 2018-08-17 MED ORDER — ENSURE ENLIVE PO LIQD: 237.0000 mL | Freq: Three times a day (TID) | ORAL | 12 refills | Status: DC

## 2018-08-17 MED ORDER — PANTOPRAZOLE SODIUM 40 MG PO TBEC: 40.0000 mg | DELAYED_RELEASE_TABLET | Freq: Every day | ORAL | 0 refills | Status: DC

## 2018-08-17 NOTE — Discharge Summary (Signed)
Discharge Summary  Kristen Lozano QAS:[id number] DOB: 1955-12-10  PCP: Audley Hose, MD  Admit date: 08/09/2018 Discharge date: 08/20/2018   Time spent: < 25 minutes  Admitted From: home Disposition:  home  Recommendations for Outpatient Follow-up:  1. Follow up with PCP in 1-2 weeks 2. Plan to start ACE/ARB as outpt, in HF f/u 3.     Discharge Diagnoses:  Active Hospital Problems   Diagnosis Date Noted  . Malnutrition of moderate degree 08/13/2018  . Enteritis   . Pleural effusion   . Nausea and vomiting 08/11/2018  . Acute systolic CHF (congestive heart failure), NYHA class 3 ([place])   . Dilated cardiomyopathy ([place])   . Nausea & vomiting 08/09/2018  . Type 2 diabetes mellitus with vascular disease ([place]) 08/09/2018  . Abdominal pain 08/09/2018  . Chronic combined systolic and diastolic CHF (congestive heart failure) ([place]) 12/16/2016  . LV (left ventricular) mural thrombus following MI ([place])   . History of completed stroke 10/18/2014  . Essential hypertension 03/22/2011    Resolved Hospital Problems  No resolved problems to display.    Discharge Condition: stable   CODE STATUS:FULL    History of present illness:   Kristen Lozano is a 62 y.o. year old female with medical history significant for history of LV thrombus and CVA, on anticoagulation, history of hypertension, diabetes type 2 who presented on 08/09/2018 with nausea, vomiting for several days and abdominal pain without diarrhea and was found to have left lower quadrant inflammatory changes on CT abdomen concerning for enteritis initially empirically treated with azithromycin and ceftriaxone then transition into Zosyn Remaining hospital course addressed in problem based format below:   Hospital Course:    Small bowel enteritis. Wall thickening concerning for regional enteritis on CT imaging on 08/09/18.  Completed 7 day course of IV zosyn. Had nausea and vomiting that improved with treatment and  antiemetics. Prior to discharge was tolerating   Acute on chronic systolic CHF. TTE with newly reduced EF of 20-25%. Unclear etiology maybe tachycardic mediated. Plan for ischemic evaluation by cardiology as outpatient. Diuresed with IV lasix here well. Did not transition to oral lasix as she was a bit dry in setting of diminished oral intake related to prolonged nausea/vomiting from small bowel enteritis. Will follow with HF clnic on 10/3. Continue metoprolol, spironolactone. ACE/ARB to be considered as outpatient by cardiology  History of CVA and LV thrombus. Pharmacy managed warfarin. No change made to home regimen  Hypokalemia, resolved with oral repletion and addition of spirinolactone  Sinus tachycardia. Multifactorial from pain and decreased hydration related to N/V and small bowel enteritis. Improved greatly. Metoprolol helped as well. Will follow with HF clinic if does not improve will consider EP eval per cardiology recs   Consultations:  Cardiology  Procedures/Studies: TTE  Discharge Exam: BP 93/65 (BP Location: Right Arm)   Pulse (!) 107   Temp 98.4 F (36.9 C) (Oral)   Resp 19   Ht 5' 3"  (1.6 m)   Wt 49.9 kg   SpO2 100%   BMI 19.48 kg/m   General: Lying in bed, thin female, no apparent distress Eyes: EOMI, anicteric ENT: Oral Mucosa clear and moist Cardiovascular: regular rate and rhythm, no murmurs, rubs or gallops, no edema, Respiratory: Normal respiratory effort, lungs clear to auscultation bilaterally Abdomen: soft, non-distended, non-tender, normal bowel sounds Skin: No Rash Neurologic: Grossly no focal neuro deficit.Mental status AAOx3, speech normal, Psychiatric:Appropriate affect, and mood   Discharge Instructions You were cared for  by a hospitalist during your hospital stay. If you have any questions about your discharge medications or the care you received while you were in the hospital after you are discharged, you can call the unit and asked to speak  with the hospitalist on call if the hospitalist that took care of you is not available. Once you are discharged, your primary care physician will handle any further medical issues. Please note that NO REFILLS for any discharge medications will be authorized once you are discharged, as it is imperative that you return to your primary care physician (or establish a relationship with a primary care physician if you do not have one) for your aftercare needs so that they can reassess your need for medications and monitor your lab values.  Discharge Instructions    Diet - low sodium heart healthy   Complete by:  As directed    Increase activity slowly   Complete by:  As directed      Allergies as of 08/17/2018   No Known Allergies     Medication List    STOP taking these medications   carvedilol 25 MG tablet Commonly known as:  COREG   lisinopril 10 MG tablet Commonly known as:  PRINIVIL,ZESTRIL   ondansetron 4 MG/5ML solution Commonly known as:  ZOFRAN   promethazine 12.5 MG tablet Commonly known as:  PHENERGAN     TAKE these medications   ASPIRIN LOW DOSE 81 MG EC tablet Generic drug:  aspirin Take 1 tablet (81 mg total) by mouth daily. What changed:  See the new instructions.   atorvastatin 20 MG tablet Commonly known as:  LIPITOR Take 1 tablet (20 mg total) by mouth daily at 6 PM. Please schedule appointment for refills.   b complex vitamins tablet Take 1 tablet by mouth daily.   feeding supplement (ENSURE ENLIVE) Liqd Take 237 mLs by mouth 3 (three) times daily between meals.   metFORMIN 500 MG 24 hr tablet Commonly known as:  GLUCOPHAGE-XR Take 500 mg by mouth 2 (two) times daily. What changed:  Another medication with the same name was removed. Continue taking this medication, and follow the directions you see here.   metoCLOPramide 10 MG/10ML Soln Commonly known as:  REGLAN Take 10 mLs (10 mg total) by mouth 3 (three) times daily before meals.   Metoprolol  Tartrate 75 MG Tabs Take 75 mg by mouth 2 (two) times daily.   pantoprazole 40 MG tablet Commonly known as:  PROTONIX Take 1 tablet (40 mg total) by mouth daily.   sitaGLIPtin 100 MG tablet Commonly known as:  JANUVIA Take 1 tablet (100 mg total) by mouth daily.   spironolactone 25 MG tablet Commonly known as:  ALDACTONE Take 1 tablet (25 mg total) by mouth daily.   TRULICITY 6.64 QI/3.4VQ Sopn Generic drug:  Dulaglutide Inject 0.75 mg into the skin every Saturday.   Vitamin B-12 5000 MCG Subl Place 5,000 mcg under the tongue daily.   VITAMIN C GUMMIE PO Take 250 mg by mouth daily.   VITAMIN D3 ADULT GUMMIES PO Take 2,000 Units by mouth daily.   Vitamin E 400 units Chew Chew 400 Units by mouth daily.   warfarin 5 MG tablet Commonly known as:  COUMADIN Take as directed. If you are unsure how to take this medication, talk to your nurse or doctor. Original instructions:  Tale 1.5 Tablets (7.34m)daily OR as directed by coumadin clinic What changed:  See the new instructions.      No  Known Allergies Follow-up Information    Lendon Colonel, NP. Go on 08/27/2018.   Specialties:  Nurse Practitioner, Radiology, Cardiology Why:  @10 :30am for hospital follow up Contact information: [place] [address] [address] [place], [place] Follow up.   Specialty:  Home Health Services Why:  For home health services. they will call you in 1-2 days to set up home services  Contact information: [address] [place] [address] [phone]            The results of significant diagnostics from this hospitalization (including imaging, microbiology, ancillary and laboratory) are listed below for reference.    Significant Diagnostic Studies: Dg Chest 2 View  Result Date: 08/09/2018 CLINICAL DATA:  Generalized abdominal pain. Nausea, vomiting and diarrhea. EXAM: CHEST - 2 VIEW COMPARISON:  Chest x-ray dated  09/04/2014. FINDINGS: Cardiomegaly, partially obscured by dense bibasilar opacities. Upper lungs are relatively clear. Osseous structures about the chest are unremarkable. IMPRESSION: 1. Dense bibasilar opacities, suspicious for bilateral pneumonia, less likely asymmetric pulmonary edema. 2. Probable bilateral pleural effusions, small to moderate in size. 3. Probable cardiomegaly, difficult to definitively characterize due to obscuration by the bibasilar opacities. Electronically Signed   By: Franki Cabot M.D.   On: 08/09/2018 14:11   Ct Abdomen Pelvis W Contrast  Result Date: 08/09/2018 CLINICAL DATA:  Abdominal pain with nausea, vomiting and diarrhea 3 days. EXAM: CT ABDOMEN AND PELVIS WITH CONTRAST TECHNIQUE: Multidetector CT imaging of the abdomen and pelvis was performed using the standard protocol following bolus administration of intravenous contrast. CONTRAST:  111m OMNIPAQUE IOHEXOL 300 MG/ML  SOLN COMPARISON:  01/09/2018 and 09/07/2014 FINDINGS: Lower chest: Examination demonstrates moderate size bilateral pleural effusions with associated compressive atelectasis in the lung bases. Mild cardiomegaly. Hepatobiliary: Mild diffuse low-attenuation of the liver without focal mass. Gallbladder and biliary tree are normal. Pancreas: Normal. Spleen: Normal. Adrenals/Urinary Tract: Adrenal glands are normal. Kidneys are normal in size with patchy bilateral renal cortical scarring. Few small subcentimeter bilateral cortical hypodensities too small to characterize but likely cysts. No hydronephrosis or nephrolithiasis. Bladder and ureters are normal. Stomach/Bowel: Stomach is within normal. Mild wall thickening of several small bowel loops over the left lower quadrant which may be due to regional enteritis of infectious or inflammatory nature. No bowel obstruction. Appendix is normal. Mild decompression of the transverse and descending colon as the colon is otherwise unremarkable. Vascular/Lymphatic: Normal.  Reproductive: Normal. Other: No free fluid or focal inflammatory change. Musculoskeletal: Unremarkable. IMPRESSION: Few small bowel loops over the left lower quadrant with wall thickening which may be due to a regional enteritis of infectious or inflammatory nature. No bowel obstruction. Moderate size bilateral pleural effusions with bibasilar atelectasis. Bilateral renal cortical scarring. Couple small subcentimeter bilateral renal cortical hypodensities too small to characterize but likely cysts. Attic steatosis. Mild cardiomegaly. Electronically Signed   By: DMarin OlpM.D.   On: 08/09/2018 16:46   Dg Abd Acute W/chest  Result Date: 08/10/2018 CLINICAL DATA:  Abdomen pain with nausea and vomiting EXAM: DG ABDOMEN ACUTE W/ 1V CHEST COMPARISON:  CT 08/09/2018, radiograph 08/09/2018 FINDINGS: Single-view chest demonstrates cardiomegaly with vascular congestion, bilateral pleural effusion, and bibasilar consolidations. Increased peripheral opacity in the right mid lung. Supine and upright views of the abdomen demonstrate no free air beneath the diaphragm. Paucity of abdominal bowel gas which is nonspecific. Residual contrast within the urinary bladder and renal collecting systems. IMPRESSION: 1. Small moderate bilateral  pleural effusions. Cardiomegaly with vascular congestion and mild interstitial edema. Bibasilar atelectasis or pneumonia. Increasing peripheral infiltrate in the right mid lung. 2. Nonspecific diffuse decreased bowel gas. Electronically Signed   By: Donavan Foil M.D.   On: 08/10/2018 01:54   Dg Swallowing Func-speech Pathology  Result Date: 08/14/2018 Objective Swallowing Evaluation: Type of Study: MBS-Modified Barium Swallow Study  Patient Details Name: AUTUMN PRUITT MRN: [id number] Date of Birth: 1956/04/25 Today's Date: 08/14/2018 Time: SLP Start Time (ACUTE ONLY): 68 -SLP Stop Time (ACUTE ONLY): 1153 SLP Time Calculation (min) (ACUTE ONLY): 23 min Past Medical History: Past Medical  History: Diagnosis Date . Allergic rhinitis   Requires cetirizine, singulair, and fluticasone. . Anxiety   Has been on Xanax since 2009. Uses it for stress, anxiety, and insomnia. No contract yet. . Arthritis  . CHF (congestive heart failure) (HCC)   EF 35% after stroke, presumed ischemic . Chronic pain   Has OA of knees B. No Xrays in echart. Not requiring narcotics. . Colitis 12/2017 . Depression  . Diabetes mellitus   Type 2, non insulin dependent. Was dx'd prior to 2008. [facility] Kitchen Hyperglycemia  . Hyperlipemia  . Hypertension  . Sickle cell trait ([place])  . Stroke [facility]) 09/05/14  Dominant left MCA infarcts secondary to unknown embolic source  Past Surgical History: Past Surgical History: Procedure Laterality Date . TEE WITHOUT CARDIOVERSION N/A 09/06/2014  Procedure: TRANSESOPHAGEAL ECHOCARDIOGRAM (TEE);  Surgeon: Pixie Casino, MD;  Location: [facility] ENDOSCOPY;  Service: Cardiovascular;  Laterality: N/A; HPI: 62 year old woman with a history of LV thrombus and CVA, hypertension, diabetes type 2, presenting to the ER with persistent nausea and vomiting. Per chart CT of the abdomen and pelvis showing thickened bowel loops in the left lower quadrant, infectious versus inflammatory.  Dx includes pna, likely due to aspiration per MD notes. Remote hx of a mild oral dysphagia per [facility] November 2015. BSE 08/12/18 suspected primary esophageal dysphagia, no concerns for oropharyngeal dysphagia and recommended work up for stricture/motility disorder and d/c ST. BSE reordered 9/10.  Subjective: sluggish; delayed responses Assessment / Plan / Recommendation CHL IP CLINICAL IMPRESSIONS 08/14/2018 Clinical Impression Pt exhibited moderate oral and mild pharyneal dysphagia marked by delayed transit and flash penetration. With thicker textures (puree, graham cracker) oral transit was delayed, significant difficulty propelling boluses lingual rocking motion and prolonged mastication with cracker. Initiation of swallow occured primarily at the  pyriforms and vallecule with slight incomplete laryngeal closure with barium entering laryngeal vestibule and exiting during the swallow. No pharyngeal residue observed however of significant note, barium passed UES without difficulty but then reappeared ascending cervical esophagus into pyriform sinuses. MBS does not diagnose below the level of the UES however esophageal scan with subsequent swallows did not reveal overt abnormalities. Pt with neurological dysphagia likely from prior CVA however suspect mild behavioral component and suspect GERD. Would benefit from further work up for esophageal impairment SLP Visit Diagnosis Dysphagia, oropharyngeal phase (R13.12) Attention and concentration deficit following -- Frontal lobe and executive function deficit following -- Impact on safety and function Mild aspiration risk   CHL IP TREATMENT RECOMMENDATION 08/14/2018 Treatment Recommendations Therapy as outlined in treatment plan below   Prognosis 08/14/2018 Prognosis for Safe Diet Advancement Fair Barriers to Reach Goals -- Barriers/Prognosis Comment -- CHL IP DIET RECOMMENDATION 08/14/2018 SLP Diet Recommendations Dysphagia 3 (Mech soft) solids;Thin liquid Liquid Administration via Straw;Cup Medication Administration Whole meds with liquid Compensations Small sips/bites;Slow rate Postural Changes Seated upright at 90 degrees;Remain semi-upright after after feeds/meals (Comment)  CHL IP OTHER RECOMMENDATIONS 08/14/2018 Recommended Consults Consider GI evaluation Oral Care Recommendations Oral care BID Other Recommendations --   CHL IP FOLLOW UP RECOMMENDATIONS 08/14/2018 Follow up Recommendations None   CHL IP FREQUENCY AND DURATION 08/14/2018 Speech Therapy Frequency (ACUTE ONLY) min 1 x/week Treatment Duration 2 weeks      CHL IP ORAL PHASE 08/14/2018 Oral Phase Impaired Oral - Pudding Teaspoon -- Oral - Pudding Cup -- Oral - Honey Teaspoon -- Oral - Honey Cup -- Oral - Nectar Teaspoon -- Oral - Nectar Cup -- Oral -  Nectar Straw -- Oral - Thin Teaspoon -- Oral - Thin Cup WFL Oral - Thin Straw WFL Oral - Puree Lingual pumping;Reduced posterior propulsion;Delayed oral transit Oral - Mech Soft -- Oral - Regular Delayed oral transit;Reduced posterior propulsion Oral - Multi-Consistency -- Oral - Pill -- Oral Phase - Comment --  CHL IP PHARYNGEAL PHASE 08/14/2018 Pharyngeal Phase Impaired Pharyngeal- Pudding Teaspoon -- Pharyngeal -- Pharyngeal- Pudding Cup -- Pharyngeal -- Pharyngeal- Honey Teaspoon -- Pharyngeal -- Pharyngeal- Honey Cup -- Pharyngeal -- Pharyngeal- Nectar Teaspoon -- Pharyngeal -- Pharyngeal- Nectar Cup -- Pharyngeal -- Pharyngeal- Nectar Straw -- Pharyngeal -- Pharyngeal- Thin Teaspoon -- Pharyngeal -- Pharyngeal- Thin Cup Delayed swallow initiation-pyriform sinuses;Penetration/Aspiration during swallow Pharyngeal Material enters airway, remains ABOVE vocal cords then ejected out Pharyngeal- Thin Straw Penetration/Aspiration during swallow Pharyngeal Material enters airway, remains ABOVE vocal cords then ejected out Pharyngeal- Puree Delayed swallow initiation-vallecula Pharyngeal -- Pharyngeal- Mechanical Soft -- Pharyngeal -- Pharyngeal- Regular WFL Pharyngeal -- Pharyngeal- Multi-consistency -- Pharyngeal -- Pharyngeal- Pill -- Pharyngeal -- Pharyngeal Comment --  CHL IP CERVICAL ESOPHAGEAL PHASE 08/14/2018 Cervical Esophageal Phase WFL Pudding Teaspoon -- Pudding Cup -- Honey Teaspoon -- Honey Cup -- Nectar Teaspoon -- Nectar Cup -- Nectar Straw -- Thin Teaspoon -- Thin Cup -- Thin Straw -- Puree -- Mechanical Soft -- Regular -- Multi-consistency -- Pill -- Cervical Esophageal Comment -- Houston Siren 08/14/2018, 3:25 PM Orbie Pyo Litaker M.Ed Actor Pager [phone] Office [phone]               Microbiology: No results found for this or any previous visit (from the past 240 hour(s)).   Labs: Basic Metabolic Panel: Recent Labs  Lab 08/13/18 1927 08/14/18 0712  08/15/18 0452 08/16/18 0759  NA  --  141 141 139  K 3.3* 3.2* 3.4* 3.8  CL  --  95* 99 98  CO2  --  34* 28 31  GLUCOSE  --  151* 152* 129*  BUN  --  13 10 9   CREATININE  --  0.98 0.81 0.86  CALCIUM  --  9.2 9.3 8.9   Liver Function Tests: No results for input(s): AST, ALT, ALKPHOS, BILITOT, PROT, ALBUMIN in the last 168 hours. No results for input(s): LIPASE, AMYLASE in the last 168 hours. No results for input(s): AMMONIA in the last 168 hours. CBC: Recent Labs  Lab 08/14/18 0712  WBC 7.6  HGB 14.1  HCT 44.3  MCV 67.8*  PLT 264   Cardiac Enzymes: No results for input(s): CKTOTAL, CKMB, CKMBINDEX, TROPONINI in the last 168 hours. BNP: BNP (last 3 results) Recent Labs    08/09/18 1643  BNP 1,148.3*    ProBNP (last 3 results) No results for input(s): PROBNP in the last 8760 hours.  CBG: Recent Labs  Lab 08/17/18 0028 08/17/18 0435 08/17/18 0726 08/17/18 1107 08/17/18 1620  GLUCAP 127* 141* 146* 208* 302*       Signed:  Desiree Hane, MD [facility] 08/20/2018, 11:25 AM

## 2018-08-17 NOTE — Progress Notes (Signed)
  Speech Language Pathology Treatment: Dysphagia  Patient Details Name: Kristen Lozano MRN: [id number] DOB: 1956/08/10 Today's Date: 08/17/2018 Time: 0228-4069 SLP Time Calculation (min) (ACUTE ONLY): 8 min  Assessment / Plan / Recommendation Clinical Impression  Reviewed results of MBS 9/19 and clinical reasoning for recommendations to remain upright after meals. No s/s aspiration with thin via straw or regular texture. Pt demonstrated mildly prolonged mastication and propulsion as noted during bedside swallow and MBS. Recommend continue Dys 3 diet, thin liquids and pills with thin as tolerated. No further ST needed and pt being discharged home today.    HPI HPI: 62 year old woman with a history of LV thrombus and CVA, hypertension, diabetes type 2, presenting to the ER with persistent nausea and vomiting. Per chart CT of the abdomen and pelvis showing thickened bowel loops in the left lower quadrant, infectious versus inflammatory.  Dx includes pna, likely due to aspiration per MD notes. Remote hx of a mild oral dysphagia per [facility] November 2015. BSE 08/12/18 suspected primary esophageal dysphagia, no concerns for oropharyngeal dysphagia and recommended work up for stricture/motility disorder and d/c ST. BSE reordered 9/10.       SLP Plan  All goals met;Discharge SLP treatment due to (comment)       Recommendations  Diet recommendations: Dysphagia 3 (mechanical soft);Thin liquid Liquids provided via: Cup;Straw Medication Administration: Whole meds with puree Supervision: Patient able to self feed Compensations: Slow rate;Small sips/bites Postural Changes and/or Swallow Maneuvers: Seated upright 90 degrees;Upright 30-60 min after meal                Oral Care Recommendations: Oral care BID Follow up Recommendations: None SLP Visit Diagnosis: Dysphagia, oropharyngeal phase (R13.12) Plan: All goals met;Discharge SLP treatment due to (comment)       GO                Houston Siren 08/17/2018, 3:18 PM   Orbie Pyo Colvin Caroli.Ed Risk analyst [phone] Office [phone]

## 2018-08-17 NOTE — Progress Notes (Signed)
Inpatient Diabetes Program Recommendations  AACE/ADA: New Consensus Statement on Inpatient Glycemic Control (2015)  Target Ranges:  Prepandial:   less than 140 mg/dL      Peak postprandial:   less than 180 mg/dL (1-2 hours)      Critically ill patients:  140 - 180 mg/dL   Lab Results  Component Value Date   GLUCAP 208 (H) 08/17/2018   HGBA1C 7.7 (H) 01/11/2018    Review of Glycemic ControlResults for Kristen, Lozano (MRN [id number]) as of 08/17/2018 12:51  Ref. Range 08/16/2018 20:20 08/17/2018 00:28 08/17/2018 04:35 08/17/2018 07:26 08/17/2018 11:07  Glucose-Capillary Latest Ref Range: 70 - 99 mg/dL 266 (H) 127 (H) 141 (H) 146 (H) 208 (H)   Diabetes history:  Type 2 DM  Outpatient Diabetes medications: Trulicity .75 weekly, Metformin 500 mg bid, Januvia 100 mg daily Current orders for Inpatient glycemic control: Novolog sensitive q 4 hours Inpatient Diabetes Program Recommendations:   May consider reducing Novolog correction to tid with meals and HS.  If fasting>180 mg/dL, consider adding Levemir 6 units daily.   Thanks,  Adah Perl, RN, BC-ADM Inpatient Diabetes Coordinator Pager [phone] (8a-5p)

## 2018-08-17 NOTE — Progress Notes (Signed)
Discharge instructions given to patient and family member, verbalized understanding.  VSS.  Denies pain. Pt left floor via wheelchair accompanied by staff and family.

## 2018-08-17 NOTE — Discharge Instructions (Signed)
Nurse Practitioner Radiology Cardiology [phone] [facility] Cardiovascular Division [address] [place] [address]      Next Steps: Go on 08/27/2018    Instructions: @10 :30am for hospital follow up     Please follow up with your heart doctor.  Your appointment is scheduled for 10:30 am on 10/3. If you have any questions call the number above

## 2018-08-17 NOTE — Progress Notes (Signed)
Physical Therapy Treatment Patient Details Name: Kristen Lozano MRN: [id number] DOB: 02-24-1956 Today's Date: 08/17/2018    History of Present Illness Pt is a 62 y.o. F with significant PMH of LV thrombus and CVA, history of hypertension, diabetes type 2, presenting to ER with persistent nausea and vomiting over last 4 days, accompanied by lower abdominal pain without diarrhea.     PT Comments    Continuing work on functional mobility and activity tolerance;  Session focused on progressive ambulation -- noted pt is declining SNF for post-acute rehab, and will be going home with [facility] therapies; We walked a much longer distance, noting mild unsteadiness, but no frank loss of balance; We discussed use of cane for third point of stability and Ms. Adelstein is open to the idea  Follow Up Recommendations  Home health PT;Supervision/Assistance - 24 hour     Equipment Recommendations  Cane    Recommendations for Other Services       Precautions / Restrictions Precautions Precautions: Fall    Mobility  Bed Mobility Overal bed mobility: Needs Assistance Bed Mobility: Supine to Sit     Supine to sit: Supervision     General bed mobility comments: Supervision for safety  Transfers Overall transfer level: Needs assistance Equipment used: None Transfers: Sit to/from Stand Sit to Stand: Min guard         General transfer comment: Min guard for steadying assist to stand.   Ambulation/Gait Ambulation/Gait assistance: Min guard(without physical contact) Gait Distance (Feet): 400 Feet Assistive device: None Gait Pattern/deviations: Step-through pattern;Narrow base of support;Drifts right/left;Shuffle;Trunk flexed Gait velocity: decr   General Gait Details: Slow unsteady gait with erratic step width; no frank loss of balance   Stairs             Wheelchair Mobility    Modified Rankin (Stroke Patients Only)       Balance     Sitting balance-Leahy Scale: Good        Standing balance-Leahy Scale: Fair                              Cognition Arousal/Alertness: Awake/alert Behavior During Therapy: Flat affect Overall Cognitive Status: Impaired/Different from baseline Area of Impairment: Safety/judgement;Problem solving                   Current Attention Level: Selective Memory: Decreased short-term memory Following Commands: Follows one step commands consistently Safety/Judgement: Decreased awareness of safety;Decreased awareness of deficits   Problem Solving: Slow processing General Comments: Per chart review, pt has lower education/comprehension level at baseline with poor insight into medical issues.      Exercises      General Comments        Pertinent Vitals/Pain Pain Assessment: No/denies pain Faces Pain Scale: No hurt    Home Living                      Prior Function            PT Goals (current goals can now be found in the care plan section) Acute Rehab PT Goals Patient Stated Goal: home PT Goal Formulation: With patient Time For Goal Achievement: 08/27/18 Potential to Achieve Goals: Fair Progress towards PT goals: Progressing toward goals    Frequency    Min 3X/week      PT Plan Discharge plan needs to be updated(pt and family choosing to dc home)  Co-evaluation              AM-PAC PT "6 Clicks" Daily Activity  Outcome Measure  Difficulty turning over in bed (including adjusting bedclothes, sheets and blankets)?: None Difficulty moving from lying on back to sitting on the side of the bed? : A Little Difficulty sitting down on and standing up from a chair with arms (e.g., wheelchair, bedside commode, etc,.)?: A Little Help needed moving to and from a bed to chair (including a wheelchair)?: None Help needed walking in hospital room?: A Little Help needed climbing 3-5 steps with a railing? : A Little 6 Click Score: 20    End of Session   Activity Tolerance: Patient  tolerated treatment well Patient left: in chair;with call bell/phone within reach Nurse Communication: Mobility status PT Visit Diagnosis: Unsteadiness on feet (R26.81);Muscle weakness (generalized) (M62.81);Difficulty in walking, not elsewhere classified (R26.2)     Time: 6160-7371 PT Time Calculation (min) (ACUTE ONLY): 15 min  Charges:  $Gait Training: 8-22 mins                     Roney Marion, [place]  Acute Rehabilitation Services Pager [phone] Office [place] 08/17/2018, 3:02 PM

## 2018-08-17 NOTE — Care Management Note (Signed)
Case Management Note  Patient Details  Name: Kristen Lozano MRN: [id number] Date of Birth: 1956/09/08  Subjective/Objective:                    Action/Plan:  Previous CM set patient up with [facility] for home health. Ordered and called Jeneen Rinks with [place] [phone] for cane Expected Discharge Date:                  Expected Discharge Plan:  [place]  In-House Referral:     Discharge planning Services  CM Consult  Post Acute Care Choice:  Home Health Choice offered to:  Patient  DME Arranged:  Kasandra Knudsen DME Agency:  [place] Arranged:  RN, PT, OT, Disease Management, Nurse's Aide, Social Work [employer] Agency:  [place]  Status of Service:  Completed, signed off  If discussed at [employer], dates discussed:    Additional Comments:  Marilu Favre, RN 08/17/2018, 3:28 PM

## 2018-08-24 NOTE — Progress Notes (Deleted)
Cardiology Office Note   Date:  08/24/2018   ID:  Kristen Lozano, DOB 06/23/1956, MRN [id number]  PCP:  Audley Hose, MD  Cardiologist:   No chief complaint on file.    History of Present Illness: Kristen Lozano is a 62 y.o. 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   She was seen during recent hospitalization for decompensated CHF    Past Medical History:  Diagnosis Date  . Allergic rhinitis    Requires cetirizine, singulair, and fluticasone.  . Anxiety    Has been on Xanax since 2009. Uses it for stress, anxiety, and insomnia. No contract yet.  . Arthritis   . CHF (congestive heart failure) (HCC)    EF 35% after stroke, presumed ischemic  . Chronic pain    Has OA of knees B. No Xrays in echart. Not requiring narcotics.  . Colitis 12/2017  . Depression   . Diabetes mellitus    Type 2, non insulin dependent. Was dx'd prior to 2008.  Marland Kitchen Hyperglycemia   . Hyperlipemia   . Hypertension   . Sickle cell trait ([place])   . 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 TEE WITHOUT CARDIOVERSION N/A 09/06/2014   Procedure: TRANSESOPHAGEAL ECHOCARDIOGRAM (TEE);  Surgeon: Pixie Casino, MD;  Location: [facility] ENDOSCOPY;  Service: Cardiovascular;  Laterality: N/A;     Current Outpatient Medications  Medication Sig Dispense Refill  . Ascorbic Acid (VITAMIN C GUMMIE PO) Take 250 mg by mouth daily.    . ASPIRIN LOW DOSE 81 MG EC tablet Take 1 tablet (81 mg total) by mouth daily. (Patient taking differently: Take 81 mg by mouth daily. ) 30 tablet 6  . atorvastatin (LIPITOR) 20 MG tablet Take 1 tablet (20 mg total) by mouth daily at 6 PM. Please schedule appointment for refills. 30 tablet  0  . b complex vitamins tablet Take 1 tablet by mouth daily.    . Cholecalciferol (VITAMIN D3 ADULT GUMMIES PO) Take 2,000 Units by mouth daily.    . Cyanocobalamin (VITAMIN B-12) 5000 MCG SUBL Place 5,000 mcg under the tongue daily.    . Dulaglutide (TRULICITY) 8.09 XI/3.3AS SOPN Inject 0.75 mg into the skin every Saturday.    . feeding supplement, ENSURE ENLIVE, (ENSURE ENLIVE) LIQD Take 237 mLs by mouth 3 (three) times daily between meals. 237 mL 12  . metFORMIN (GLUCOPHAGE-XR) 500 MG 24 hr tablet Take 500 mg by mouth 2 (two) times daily.  3  . metoCLOPramide (REGLAN) 10 MG/10ML SOLN Take 10 mLs (10 mg total) by mouth 3 (three) times daily before meals. 1200 mL 0  . Metoprolol Tartrate 75 MG TABS Take 75 mg by mouth 2 (two) times daily. 60 tablet 0  . pantoprazole (PROTONIX) 40 MG tablet Take 1 tablet (40 mg total) by mouth daily. 60 tablet 0  . sitaGLIPtin (JANUVIA) 100 MG tablet Take 1 tablet (100 mg total) by mouth daily. (Patient not taking: Reported on 08/09/2018) 30 tablet 0  . spironolactone (ALDACTONE) 25 MG tablet Take 1 tablet (25 mg total) by mouth daily. 30 tablet 0  . Vitamin E 400 units CHEW Chew 400 Units by mouth daily.    Marland Kitchen warfarin (COUMADIN) 5  MG tablet Tale 1.5 Tablets (7.5mg )daily OR as directed by coumadin clinic (Patient taking differently: Take 5-7.5 mg by mouth See admin instructions. Take 1 1/2 tablets (7.5 mg) on Monday and Friday evening, take 1 tablet (5 mg) on Sunday, Tuesday, Wednesday, Thursday, Saturday evening or as directed by coumadin clinic) 50 tablet 1   No current facility-administered medications for this visit.     Allergies:   Patient has no known allergies.    Social History:  The patient  reports that she has never smoked. She has never used smokeless tobacco. She reports that she does not drink alcohol or use drugs.   Family History:  The patient's family history includes Diabetes in her father and mother; Heart attack in her father; Heart disease  in her father; Hypertension in her brother, daughter, father, mother, and sister; Kidney disease in her brother and sister; Liver cancer in her sister; Ovarian cancer in her sister; Schizophrenia in her daughter; Sickle cell anemia in her daughter.    ROS: All other systems are reviewed and negative. Unless otherwise mentioned in H&P    PHYSICAL EXAM: VS:  There were no vitals taken for this visit. , BMI There is no height or weight on file to calculate BMI. GEN: Well nourished, well developed, in no acute distress HEENT: normal Neck: no JVD, carotid bruits, or masses Cardiac: ***RRR; no murmurs, rubs, or gallops,no edema  Respiratory:  Clear to auscultation bilaterally, normal work of breathing GI: soft, nontender, nondistended, + BS MS: no deformity or atrophy Skin: warm and dry, no rash Neuro:  Strength and sensation are intact Psych: euthymic mood, full affect   EKG:  EKG {ACTION; IS/IS [id number]} ordered today. The ekg ordered today demonstrates ***   Recent Labs: 08/09/2018: B Natriuretic Peptide 1,148.3 08/10/2018: ALT 39 08/13/2018: Magnesium 1.9 08/14/2018: Hemoglobin 14.1; Platelets 264 08/16/2018: BUN 9; Creatinine, Ser 0.86; Potassium 3.8; Sodium 139; TSH 0.900    Lipid Panel    Component Value Date/Time   CHOL 146 10/30/2016 0240   TRIG 104 10/30/2016 0240   HDL 44 10/30/2016 0240   CHOLHDL 3.3 10/30/2016 0240   VLDL 21 10/30/2016 0240   LDLCALC 81 10/30/2016 0240      Wt Readings from Last 3 Encounters:  08/10/18 109 lb 15.8 oz (49.9 kg)  01/14/18 104 lb 15 oz (47.6 kg)  01/09/18 108 lb (49 kg)      Other studies Reviewed: Echo 08/10/18 Study Conclusions  - Left ventricle: The cavity size was normal. Wall thickness was normal. Systolic function was severely reduced. The estimated ejection fraction was in the range of 20% to 25%. Diffuse hypokinesis. The study is not technically sufficient to allow evaluation of LV diastolic function. -  Ventricular septum: Septal motion showed abnormal function and dyssynergy. - Aortic valve: There was trivial regurgitation. - Mitral valve: There was moderate regurgitation. - Left atrium: The atrium was mildly dilated. - Tricuspid valve: There was trivial regurgitation. - Pulmonary arteries: Systolic pressure was moderately increased. PA peak pressure: 53 mm Hg (S). - Pericardium, extracardiac: A trivial pericardial effusion was identified. There was a left pleural effusion.  Impressions:  - EF is reduced when compared to prior study (40%)  ASSESSMENT AND PLAN:  1.  ***   Current medicines are reviewed at length with the patient today.    Labs/ tests ordered today include: *** Phill Myron. West Pugh, ANP, AACC   08/24/2018 10:11 AM    [facility] [place]  [address] Office [phone] Fax [phone]

## 2018-08-27 ENCOUNTER — Ambulatory Visit: Payer: Medicare HMO | Admitting: Adult Health

## 2018-08-31 NOTE — Progress Notes (Deleted)
Cardiology Office Note   Date:  08/31/2018   ID:  Kristen Lozano, DOB 10/11/1956, MRN [id number]  PCP:  Audley Hose, MD  Cardiologist:  Dr. Debara Pickett  No chief complaint on file.    History of Present Illness: Kristen Lozano is a 62 y.o. female who presents for ongoing assessment and management of chronic diastolic CHF, HTN, his of LV thrombus on chronic coumadin therapy, with other history of Type II Diabetes.. Was seen during recent hospitalization after admission for aspiration pneumonia and acute on chronic systolic CHF.   Echo revealed newly reduced EF of 20%025%, with diffuse hypokinesis and moderate mitral regurgitation.Thought to be possibly related to tachy mediated CM as HR were in the 120's-130's initially. Metoprolol was increased to 75 mg, low dose spironolactone was added. Consideration for addition of ACE. He was also treated with antibiotics for pneumonia. He was continued on coumadin therapy. If HR did not improved, he was to be referred to EP.   Past Medical History:  Diagnosis Date  . Allergic rhinitis    Requires cetirizine, singulair, and fluticasone.  . Anxiety    Has been on Xanax since 2009. Uses it for stress, anxiety, and insomnia. No contract yet.  . Arthritis   . CHF (congestive heart failure) (HCC)    EF 35% after stroke, presumed ischemic  . Chronic pain    Has OA of knees B. No Xrays in echart. Not requiring narcotics.  . Colitis 12/2017  . Depression   . Diabetes mellitus    Type 2, non insulin dependent. Was dx'd prior to 2008.  Marland Kitchen Hyperglycemia   . Hyperlipemia   . Hypertension   . Sickle cell trait ([place])   . 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 TEE WITHOUT CARDIOVERSION N/A 09/06/2014   Procedure: TRANSESOPHAGEAL ECHOCARDIOGRAM (TEE);  Surgeon: Pixie Casino, MD;  Location: [facility] ENDOSCOPY;  Service: Cardiovascular;  Laterality: N/A;       Current Outpatient Medications  Medication Sig Dispense Refill  . Ascorbic Acid (VITAMIN C GUMMIE PO) Take 250 mg by mouth daily.    . ASPIRIN LOW DOSE 81 MG EC tablet Take 1 tablet (81 mg total) by mouth daily. (Patient taking differently: Take 81 mg by mouth daily. ) 30 tablet 6  . atorvastatin (LIPITOR) 20 MG tablet Take 1 tablet (20 mg total) by mouth daily at 6 PM. Please schedule appointment for refills. 30 tablet 0  . b complex vitamins tablet Take 1 tablet by mouth daily.    . Cholecalciferol (VITAMIN D3 ADULT GUMMIES PO) Take 2,000 Units by mouth daily.    . Cyanocobalamin (VITAMIN B-12) 5000 MCG SUBL Place 5,000 mcg under the tongue daily.    . Dulaglutide (TRULICITY) 3.55 DD/2.2GU SOPN Inject 0.75 mg into the skin every Saturday.    . feeding supplement, ENSURE ENLIVE, (ENSURE ENLIVE) LIQD Take 237 mLs by mouth 3 (three) times daily between meals. 237 mL 12  . metFORMIN (GLUCOPHAGE-XR) 500 MG 24 hr tablet Take 500 mg by mouth 2 (two) times daily.  3  . metoCLOPramide (REGLAN) 10 MG/10ML SOLN Take 10 mLs (10 mg total) by mouth 3 (three) times daily before meals. 1200 mL 0  . Metoprolol Tartrate 75 MG TABS Take 75 mg by mouth 2 (two) times daily. 60 tablet 0  . pantoprazole (PROTONIX) 40 MG tablet Take 1 tablet (40 mg total)  by mouth daily. 60 tablet 0  . sitaGLIPtin (JANUVIA) 100 MG tablet Take 1 tablet (100 mg total) by mouth daily. (Patient not taking: Reported on 08/09/2018) 30 tablet 0  . spironolactone (ALDACTONE) 25 MG tablet Take 1 tablet (25 mg total) by mouth daily. 30 tablet 0  . Vitamin E 400 units CHEW Chew 400 Units by mouth daily.    Marland Kitchen warfarin (COUMADIN) 5 MG tablet Tale 1.5 Tablets (7.5mg )daily OR as directed by coumadin clinic (Patient taking differently: Take 5-7.5 mg by mouth See admin instructions. Take 1 1/2 tablets (7.5 mg) on Monday and Friday evening, take 1 tablet (5 mg) on Sunday, Tuesday, Wednesday, Thursday, Saturday evening or as directed by coumadin  clinic) 50 tablet 1   No current facility-administered medications for this visit.     Allergies:   Patient has no known allergies.    Social History:  The patient  reports that she has never smoked. She has never used smokeless tobacco. She reports that she does not drink alcohol or use drugs.   Family History:  The patient's family history includes Diabetes in her father and mother; Heart attack in her father; Heart disease in her father; Hypertension in her brother, daughter, father, mother, and sister; Kidney disease in her brother and sister; Liver cancer in her sister; Ovarian cancer in her sister; Schizophrenia in her daughter; Sickle cell anemia in her daughter.    ROS: All other systems are reviewed and negative. Unless otherwise mentioned in H&P    PHYSICAL EXAM: VS:  There were no vitals taken for this visit. , BMI There is no height or weight on file to calculate BMI. GEN: Well nourished, well developed, in no acute distress HEENT: normal Neck: no JVD, carotid bruits, or masses Cardiac: ***RRR; no murmurs, rubs, or gallops,no edema  Respiratory:  Clear to auscultation bilaterally, normal work of breathing GI: soft, nontender, nondistended, + BS MS: no deformity or atrophy Skin: warm and dry, no rash Neuro:  Strength and sensation are intact Psych: euthymic mood, full affect   EKG:  EKG {ACTION; IS/IS [id number]} ordered today. The ekg ordered today demonstrates ***   Recent Labs: 08/09/2018: B Natriuretic Peptide 1,148.3 08/10/2018: ALT 39 08/13/2018: Magnesium 1.9 08/14/2018: Hemoglobin 14.1; Platelets 264 08/16/2018: BUN 9; Creatinine, Ser 0.86; Potassium 3.8; Sodium 139; TSH 0.900    Lipid Panel    Component Value Date/Time   CHOL 146 10/30/2016 0240   TRIG 104 10/30/2016 0240   HDL 44 10/30/2016 0240   CHOLHDL 3.3 10/30/2016 0240   VLDL 21 10/30/2016 0240   LDLCALC 81 10/30/2016 0240      Wt Readings from Last 3 Encounters:  08/10/18 109 lb 15.8 oz  (49.9 kg)  01/14/18 104 lb 15 oz (47.6 kg)  01/09/18 108 lb (49 kg)      Other studies Reviewed: Additional studies/ records that were reviewed today include: ***. Review of the above records demonstrates: ***   ASSESSMENT AND PLAN:  1.  ***   Current medicines are reviewed at length with the patient today.    Labs/ tests ordered today include: *** Phill Myron. West Pugh, ANP, AACC   08/31/2018 8:22 AM    [facility] [place] [address] Office [phone] Fax [phone]

## 2018-09-01 ENCOUNTER — Ambulatory Visit: Payer: Medicare HMO | Admitting: Adult Health

## 2018-09-01 ENCOUNTER — Observation Stay (HOSPITAL_COMMUNITY)
Admission: EM | Admit: 2018-09-01 | Discharge: 2018-09-02 | Disposition: A | Discharge status: Home / Self Care | Payer: Medicare HMO | Attending: Internal Medicine | Admitting: Internal Medicine

## 2018-09-01 ENCOUNTER — Ambulatory Visit: Payer: Self-pay | Admitting: Pharmacist

## 2018-09-01 ENCOUNTER — Other Ambulatory Visit: Payer: Self-pay

## 2018-09-01 ENCOUNTER — Emergency Department (HOSPITAL_COMMUNITY): Payer: Medicare HMO

## 2018-09-01 ENCOUNTER — Encounter (HOSPITAL_COMMUNITY): Payer: Self-pay

## 2018-09-01 DIAGNOSIS — Z7984 Long term (current) use of oral hypoglycemic drugs: Secondary | ICD-10-CM | POA: Insufficient documentation

## 2018-09-01 DIAGNOSIS — Z7901 Long term (current) use of anticoagulants: Principal | ICD-10-CM

## 2018-09-01 DIAGNOSIS — I236 Thrombosis of atrium, auricular appendage, and ventricle as current complications following acute myocardial infarction: Secondary | ICD-10-CM | POA: Diagnosis present

## 2018-09-01 DIAGNOSIS — I639 Cerebral infarction, unspecified: Secondary | ICD-10-CM | POA: Diagnosis present

## 2018-09-01 DIAGNOSIS — E876 Hypokalemia: Secondary | ICD-10-CM | POA: Insufficient documentation

## 2018-09-01 DIAGNOSIS — R110 Nausea: Secondary | ICD-10-CM

## 2018-09-01 DIAGNOSIS — R1084 Generalized abdominal pain: Secondary | ICD-10-CM

## 2018-09-01 DIAGNOSIS — E785 Hyperlipidemia, unspecified: Secondary | ICD-10-CM | POA: Diagnosis not present

## 2018-09-01 DIAGNOSIS — I5022 Chronic systolic (congestive) heart failure: Secondary | ICD-10-CM | POA: Insufficient documentation

## 2018-09-01 DIAGNOSIS — Z7982 Long term (current) use of aspirin: Secondary | ICD-10-CM | POA: Insufficient documentation

## 2018-09-01 DIAGNOSIS — E119 Type 2 diabetes mellitus without complications: Secondary | ICD-10-CM | POA: Diagnosis not present

## 2018-09-01 DIAGNOSIS — I110 Hypertensive heart disease with heart failure: Secondary | ICD-10-CM | POA: Diagnosis not present

## 2018-09-01 DIAGNOSIS — R109 Unspecified abdominal pain: Secondary | ICD-10-CM | POA: Diagnosis not present

## 2018-09-01 DIAGNOSIS — E1165 Type 2 diabetes mellitus with hyperglycemia: Secondary | ICD-10-CM

## 2018-09-01 DIAGNOSIS — I429 Cardiomyopathy, unspecified: Secondary | ICD-10-CM

## 2018-09-01 DIAGNOSIS — R748 Abnormal levels of other serum enzymes: Secondary | ICD-10-CM | POA: Insufficient documentation

## 2018-09-01 DIAGNOSIS — Z86718 Personal history of other venous thrombosis and embolism: Secondary | ICD-10-CM | POA: Diagnosis not present

## 2018-09-01 DIAGNOSIS — F419 Anxiety disorder, unspecified: Secondary | ICD-10-CM | POA: Insufficient documentation

## 2018-09-01 DIAGNOSIS — Z79899 Other long term (current) drug therapy: Secondary | ICD-10-CM | POA: Insufficient documentation

## 2018-09-01 DIAGNOSIS — I252 Old myocardial infarction: Secondary | ICD-10-CM | POA: Diagnosis not present

## 2018-09-01 DIAGNOSIS — Z8673 Personal history of transient ischemic attack (TIA), and cerebral infarction without residual deficits: Secondary | ICD-10-CM | POA: Diagnosis not present

## 2018-09-01 DIAGNOSIS — R0600 Dyspnea, unspecified: Secondary | ICD-10-CM

## 2018-09-01 DIAGNOSIS — IMO0002 Reserved for concepts with insufficient information to code with codable children: Secondary | ICD-10-CM | POA: Diagnosis present

## 2018-09-01 DIAGNOSIS — Z5181 Encounter for therapeutic drug level monitoring: Secondary | ICD-10-CM

## 2018-09-01 DIAGNOSIS — R000 Tachycardia, unspecified: Secondary | ICD-10-CM | POA: Diagnosis not present

## 2018-09-01 DIAGNOSIS — E1159 Type 2 diabetes mellitus with other circulatory complications: Secondary | ICD-10-CM | POA: Diagnosis present

## 2018-09-01 DIAGNOSIS — E878 Other disorders of electrolyte and fluid balance, not elsewhere classified: Secondary | ICD-10-CM | POA: Insufficient documentation

## 2018-09-01 DIAGNOSIS — I5042 Chronic combined systolic (congestive) and diastolic (congestive) heart failure: Secondary | ICD-10-CM | POA: Diagnosis present

## 2018-09-01 DIAGNOSIS — I420 Dilated cardiomyopathy: Secondary | ICD-10-CM | POA: Diagnosis present

## 2018-09-01 LAB — URINALYSIS, ROUTINE W REFLEX MICROSCOPIC
Bacteria, UA: NONE SEEN
Bilirubin Urine: NEGATIVE
Glucose, UA: 150 mg/dL — AB
Ketones, ur: 80 mg/dL — AB
Leukocytes, UA: NEGATIVE
Nitrite: NEGATIVE
Protein, ur: 100 mg/dL — AB
Specific Gravity, Urine: 1.021 (ref 1.005–1.030)
pH: 5 (ref 5.0–8.0)

## 2018-09-01 LAB — CBC WITH DIFFERENTIAL/PLATELET
Abs Immature Granulocytes: 0.05 10*3/uL (ref 0.00–0.07)
Basophils Absolute: 0 10*3/uL (ref 0.0–0.1)
Basophils Relative: 0 %
Eosinophils Absolute: 0 10*3/uL (ref 0.0–0.5)
Eosinophils Relative: 0 %
HCT: 40.2 % (ref 36.0–46.0)
Hemoglobin: 12.8 g/dL (ref 12.0–15.0)
Immature Granulocytes: 0 %
Lymphocytes Relative: 9 %
Lymphs Abs: 1 10*3/uL (ref 0.7–4.0)
MCH: 21.4 pg — ABNORMAL LOW (ref 26.0–34.0)
MCHC: 31.8 g/dL (ref 30.0–36.0)
MCV: 67.1 fL — ABNORMAL LOW (ref 80.0–100.0)
Monocytes Absolute: 0.8 10*3/uL (ref 0.1–1.0)
Monocytes Relative: 7 %
Neutro Abs: 9.5 10*3/uL — ABNORMAL HIGH (ref 1.7–7.7)
Neutrophils Relative %: 84 %
Platelets: 242 10*3/uL (ref 150–400)
RBC: 5.99 MIL/uL — ABNORMAL HIGH (ref 3.87–5.11)
RDW: 15.4 % (ref 11.5–15.5)
WBC: 11.3 10*3/uL — ABNORMAL HIGH (ref 4.0–10.5)
nRBC: 0 % (ref 0.0–0.2)

## 2018-09-01 LAB — GLUCOSE, CAPILLARY
Glucose-Capillary: 172 mg/dL — ABNORMAL HIGH (ref 70–99)
Glucose-Capillary: 207 mg/dL — ABNORMAL HIGH (ref 70–99)

## 2018-09-01 LAB — COMPREHENSIVE METABOLIC PANEL
ALT: 31 U/L (ref 0–44)
AST: 21 U/L (ref 15–41)
Albumin: 3.4 g/dL — ABNORMAL LOW (ref 3.5–5.0)
Alkaline Phosphatase: 40 U/L (ref 38–126)
Anion gap: 11 (ref 5–15)
BUN: 10 mg/dL (ref 8–23)
CO2: 22 mmol/L (ref 22–32)
Calcium: 8.4 mg/dL — ABNORMAL LOW (ref 8.9–10.3)
Chloride: 112 mmol/L — ABNORMAL HIGH (ref 98–111)
Creatinine, Ser: 0.65 mg/dL (ref 0.44–1.00)
GFR calc Af Amer: >60 mL/min (ref >60)
GFR calc non Af Amer: >60 mL/min (ref >60)
Glucose, Bld: 178 mg/dL — ABNORMAL HIGH (ref 70–99)
Potassium: 3 mmol/L — ABNORMAL LOW (ref 3.5–5.1)
Sodium: 145 mmol/L (ref 135–145)
Total Bilirubin: 1.2 mg/dL (ref 0.3–1.2)
Total Protein: 6.3 g/dL — ABNORMAL LOW (ref 6.5–8.1)

## 2018-09-01 LAB — PROTIME-INR
INR: 2.5
Prothrombin Time: 26.8 seconds — ABNORMAL HIGH (ref 11.4–15.2)

## 2018-09-01 LAB — MAGNESIUM: Magnesium: 1.4 mg/dL — ABNORMAL LOW (ref 1.7–2.4)

## 2018-09-01 LAB — TROPONIN I
Troponin I: 0.07 ng/mL (ref <0.03)
Troponin I: 0.07 ng/mL (ref <0.03)

## 2018-09-01 LAB — BRAIN NATRIURETIC PEPTIDE: B Natriuretic Peptide: 1235.6 pg/mL — ABNORMAL HIGH (ref 0.0–100.0)

## 2018-09-01 MED ORDER — TRAMADOL HCL 50 MG PO TABS: 50.0000 mg | ORAL_TABLET | Freq: Four times a day (QID) | ORAL | Status: DC | PRN

## 2018-09-01 MED ORDER — B COMPLEX-C PO TABS
1.0000 | ORAL_TABLET | Freq: Every day | ORAL | Status: DC
  Administered 2018-09-01 – 2018-09-02 (×2): 1 via ORAL
  Filled 2018-09-01 (×2): qty 1

## 2018-09-01 MED ORDER — METOPROLOL TARTRATE 5 MG/5ML IV SOLN
5.0000 mg | Freq: Once | INTRAVENOUS | Status: AC
  Administered 2018-09-01: 5 mg via INTRAVENOUS
  Filled 2018-09-01: qty 5

## 2018-09-01 MED ORDER — VITAMIN C 250 MG PO TABS
250.0000 mg | ORAL_TABLET | Freq: Every day | ORAL | Status: DC
  Filled 2018-09-01 (×2): qty 1

## 2018-09-01 MED ORDER — B COMPLEX PO TABS: 1.0000 | ORAL_TABLET | Freq: Every day | ORAL | Status: DC

## 2018-09-01 MED ORDER — ASPIRIN EC 81 MG PO TBEC
81.0000 mg | DELAYED_RELEASE_TABLET | Freq: Every day | ORAL | Status: DC
  Administered 2018-09-01 – 2018-09-02 (×2): 81 mg via ORAL
  Filled 2018-09-01 (×2): qty 1

## 2018-09-01 MED ORDER — VITAMIN E 180 MG (400 UNIT) PO CAPS
400.0000 [IU] | ORAL_CAPSULE | Freq: Every day | ORAL | Status: DC
  Filled 2018-09-01 (×2): qty 1

## 2018-09-01 MED ORDER — METOPROLOL TARTRATE 50 MG PO TABS
75.0000 mg | ORAL_TABLET | Freq: Two times a day (BID) | ORAL | Status: DC
  Administered 2018-09-01 – 2018-09-02 (×2): 75 mg via ORAL
  Filled 2018-09-01 (×2): qty 2

## 2018-09-01 MED ORDER — POTASSIUM CHLORIDE 10 MEQ/100ML IV SOLN
10.0000 meq | INTRAVENOUS | Status: AC
  Administered 2018-09-01 (×2): 10 meq via INTRAVENOUS
  Filled 2018-09-01 (×2): qty 100

## 2018-09-01 MED ORDER — ONDANSETRON HCL 4 MG PO TABS: 4.0000 mg | ORAL_TABLET | Freq: Four times a day (QID) | ORAL | Status: DC | PRN

## 2018-09-01 MED ORDER — MORPHINE SULFATE (PF) 4 MG/ML IV SOLN
4.0000 mg | Freq: Once | INTRAVENOUS | Status: AC
  Administered 2018-09-01: 4 mg via INTRAVENOUS
  Filled 2018-09-01: qty 1

## 2018-09-01 MED ORDER — ACETAMINOPHEN 650 MG RE SUPP: 650.0000 mg | Freq: Four times a day (QID) | RECTAL | Status: DC | PRN

## 2018-09-01 MED ORDER — POTASSIUM CHLORIDE 20 MEQ/15ML (10%) PO SOLN
40.0000 meq | Freq: Once | ORAL | Status: AC
  Administered 2018-09-01: 40 meq via ORAL
  Filled 2018-09-01: qty 30

## 2018-09-01 MED ORDER — SODIUM CHLORIDE 0.9 % IV SOLN
INTRAVENOUS | Status: DC
  Administered 2018-09-01: 20:00:00 via INTRAVENOUS

## 2018-09-01 MED ORDER — ONDANSETRON HCL 4 MG/2ML IJ SOLN
4.0000 mg | Freq: Four times a day (QID) | INTRAMUSCULAR | Status: DC | PRN
  Administered 2018-09-01: 4 mg via INTRAVENOUS
  Filled 2018-09-01: qty 2

## 2018-09-01 MED ORDER — ENSURE ENLIVE PO LIQD
237.0000 mL | Freq: Three times a day (TID) | ORAL | Status: DC
  Administered 2018-09-02: 237 mL via ORAL

## 2018-09-01 MED ORDER — IOPAMIDOL (ISOVUE-300) INJECTION 61%
100.0000 mL | Freq: Once | INTRAVENOUS | Status: AC | PRN
  Administered 2018-09-01: 100 mL via INTRAVENOUS

## 2018-09-01 MED ORDER — METOCLOPRAMIDE HCL 10 MG/10ML PO SOLN
10.0000 mg | Freq: Three times a day (TID) | ORAL | Status: DC
  Administered 2018-09-02: 10 mg via ORAL
  Filled 2018-09-01 (×3): qty 10

## 2018-09-01 MED ORDER — ATORVASTATIN CALCIUM 20 MG PO TABS
20.0000 mg | ORAL_TABLET | Freq: Every day | ORAL | Status: DC
  Administered 2018-09-01: 20 mg via ORAL
  Filled 2018-09-01: qty 1

## 2018-09-01 MED ORDER — WARFARIN - PHARMACIST DOSING INPATIENT: Freq: Every day | Status: DC

## 2018-09-01 MED ORDER — INSULIN ASPART 100 UNIT/ML ~~LOC~~ SOLN
0.0000 [IU] | Freq: Every day | SUBCUTANEOUS | Status: DC
  Administered 2018-09-01: 2 [IU] via SUBCUTANEOUS

## 2018-09-01 MED ORDER — SODIUM CHLORIDE 0.9 % IV SOLN
INTRAVENOUS | Status: DC
  Administered 2018-09-01: 16:00:00 via INTRAVENOUS

## 2018-09-01 MED ORDER — ACETAMINOPHEN 325 MG PO TABS: 650.0000 mg | ORAL_TABLET | Freq: Four times a day (QID) | ORAL | Status: DC | PRN

## 2018-09-01 MED ORDER — WARFARIN SODIUM 5 MG PO TABS
5.0000 mg | ORAL_TABLET | Freq: Once | ORAL | Status: AC
  Administered 2018-09-01: 5 mg via ORAL
  Filled 2018-09-01: qty 1

## 2018-09-01 MED ORDER — SPIRONOLACTONE 25 MG PO TABS
25.0000 mg | ORAL_TABLET | Freq: Every day | ORAL | Status: DC
  Administered 2018-09-01 – 2018-09-02 (×2): 25 mg via ORAL
  Filled 2018-09-01 (×2): qty 1

## 2018-09-01 MED ORDER — ONDANSETRON HCL 4 MG/2ML IJ SOLN
4.0000 mg | Freq: Once | INTRAMUSCULAR | Status: AC
  Administered 2018-09-01: 4 mg via INTRAVENOUS
  Filled 2018-09-01: qty 2

## 2018-09-01 MED ORDER — VITAMIN B-12 1000 MCG PO TABS
1000.0000 ug | ORAL_TABLET | Freq: Every day | ORAL | Status: DC
  Administered 2018-09-01 – 2018-09-02 (×2): 1000 ug via ORAL
  Filled 2018-09-01 (×2): qty 1

## 2018-09-01 MED ORDER — IOPAMIDOL (ISOVUE-300) INJECTION 61%
INTRAVENOUS | Status: AC
  Filled 2018-09-01: qty 100

## 2018-09-01 MED ORDER — POTASSIUM CHLORIDE CRYS ER 20 MEQ PO TBCR
40.0000 meq | EXTENDED_RELEASE_TABLET | Freq: Once | ORAL | Status: DC
  Filled 2018-09-01: qty 2

## 2018-09-01 MED ORDER — VITAMIN B-12 5000 MCG SL SUBL: 5000.0000 ug | SUBLINGUAL_TABLET | Freq: Every day | SUBLINGUAL | Status: DC

## 2018-09-01 MED ORDER — POLYETHYLENE GLYCOL 3350 17 G PO PACK: 17.0000 g | PACK | Freq: Every day | ORAL | Status: DC | PRN

## 2018-09-01 MED ORDER — PANTOPRAZOLE SODIUM 40 MG PO TBEC
40.0000 mg | DELAYED_RELEASE_TABLET | Freq: Every day | ORAL | Status: DC
  Administered 2018-09-01 – 2018-09-02 (×2): 40 mg via ORAL
  Filled 2018-09-01 (×2): qty 1

## 2018-09-01 MED ORDER — SODIUM CHLORIDE 0.9 % IJ SOLN
INTRAMUSCULAR | Status: AC
  Filled 2018-09-01: qty 50

## 2018-09-01 MED ORDER — MAGNESIUM SULFATE 2 GM/50ML IV SOLN
2.0000 g | Freq: Once | INTRAVENOUS | Status: AC
  Administered 2018-09-01: 2 g via INTRAVENOUS
  Filled 2018-09-01: qty 50

## 2018-09-01 MED ORDER — VITAMIN D 1000 UNITS PO TABS
2000.0000 [IU] | ORAL_TABLET | Freq: Every day | ORAL | Status: DC
  Administered 2018-09-01 – 2018-09-02 (×2): 2000 [IU] via ORAL
  Filled 2018-09-01 (×2): qty 2

## 2018-09-01 MED ORDER — INSULIN ASPART 100 UNIT/ML ~~LOC~~ SOLN
0.0000 [IU] | Freq: Three times a day (TID) | SUBCUTANEOUS | Status: DC
  Administered 2018-09-02 (×2): 3 [IU] via SUBCUTANEOUS

## 2018-09-01 NOTE — ED Notes (Addendum)
[phone] Daughter

## 2018-09-01 NOTE — ED Notes (Signed)
Patient transported to CT 

## 2018-09-01 NOTE — ED Notes (Signed)
Date and time results received: 09/01/18 15:31   Test: Troponin  Critical Value: 0.07  Name of Provider Notified: Dr. Rockne Coons   Orders Received? Or Actions Taken?: Will continue to monitor and await for new orders.

## 2018-09-01 NOTE — ED Notes (Signed)
Pt unable to take PO potassium. MD aware

## 2018-09-01 NOTE — ED Notes (Signed)
Attempted to call report to [place]. Per RN request: Call back in 5 mins

## 2018-09-01 NOTE — ED Triage Notes (Signed)
Pt arrives from home. Pt reports generalized lower abd pain since Sunday. Pt reports N/V/D that began today.

## 2018-09-01 NOTE — ED Provider Notes (Signed)
[facility] DEPT Provider Note   CSN: [id number] Arrival date & time: 09/01/18  1307     History   Chief Complaint Chief Complaint  Patient presents with  . Abdominal Pain    HPI Kristen Lozano is a 62 y.o. female.  HPI   62 year old female presenting with her sister for evaluation of generalized weakness and continued nausea/vomiting.  She was recently admitted for similar symptoms from 9/15 to 9/26.  Was diagnosed with enteritis and acute on chronic systolic heart failure.  Since discharge she has been declined.  She has felt increasingly weaker.  Short of breath with minimal activity.  Denies any chest pain.  Some mild vague abdominal pain.  She says vomiting but sister describes what sounds more like reflux/spitting up.  No acute urinary complaints.  No fevers or chills.  Past Medical History:  Diagnosis Date  . Allergic rhinitis    Requires cetirizine, singulair, and fluticasone.  . Anxiety    Has been on Xanax since 2009. Uses it for stress, anxiety, and insomnia. No contract yet.  . Arthritis   . CHF (congestive heart failure) (HCC)    EF 35% after stroke, presumed ischemic  . Chronic pain    Has OA of knees B. No Xrays in echart. Not requiring narcotics.  . Colitis 12/2017  . Depression   . Diabetes mellitus    Type 2, non insulin dependent. Was dx'd prior to 2008.  Marland Kitchen Hyperglycemia   . Hyperlipemia   . Hypertension   . Sickle cell trait ([place])   . 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 Malnutrition of moderate degree 08/13/2018  . Enteritis   . Pleural effusion   . Nausea and vomiting 08/11/2018  . Acute systolic CHF (congestive heart failure), NYHA class 3 ([place])   . Dilated cardiomyopathy ([place])   . Nausea & vomiting 08/09/2018  . 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 Abdominal pain  08/09/2018  . Infectious colitis 01/11/2018  . Hypokalemia 01/11/2018  . Uncontrolled type 2 diabetes mellitus with hyperglycemia ([place])   . Chronic combined systolic and diastolic CHF (congestive heart failure) ([place]) 12/16/2016  . Monitoring for long-term anticoagulant use 11/14/2016  . DKA (diabetic ketoacidoses) ([place]) 11/01/2016  . Diabetes mellitus 11/01/2016  . Elevated troponin   . Cardiomyopathy, ischemic   . 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 Cognitive deficit due to recent stroke 10/18/2014  . History of completed stroke 10/18/2014  . Abnormal stress test 10/17/2014  . Dysphagia, pharyngoesophageal phase 09/23/2014  . Abnormal CT scan 09/23/2014  . Dilated bile duct 09/23/2014  . Urinary retention 09/07/2014  . 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 Stroke ([place]) 09/05/2014  . CVA (cerebral infarction) 09/05/2014  . Non compliance w medication regimen 08/10/2012  . Fatigue 02/18/2012  . Essential hypertension 03/22/2011  . Healthcare maintenance 03/22/2011  . Chronic pain   . ALLERGIC RHINITIS, SEASONAL 03/14/2008  . SICKLE CELL TRAIT 10/31/2006  . Anxiety state 10/31/2006  . DEPRESSION 10/31/2006    Past Surgical History:  Procedure Laterality Date  . TEE WITHOUT CARDIOVERSION N/A 09/06/2014   Procedure: TRANSESOPHAGEAL ECHOCARDIOGRAM (TEE);  Surgeon: Pixie Casino, MD;  Location: [facility] ENDOSCOPY;  Service: Cardiovascular;  Laterality: N/A;     OB History   None      Home  Medications    Prior to Admission medications   Medication Sig Start Date End Date Taking? Authorizing Provider  Ascorbic Acid (VITAMIN C GUMMIE PO) Take 250 mg by mouth daily.   Yes [provider]  ASPIRIN LOW DOSE 81 MG EC tablet Take 1 tablet (81 mg total) by mouth daily. Patient taking differently: Take 81 mg by mouth daily.  01/22/18  Yes Hilty, Nadean Corwin, MD  atorvastatin (LIPITOR) 20 MG tablet Take 1 tablet (20 mg total) by mouth daily at 6 PM.  Please schedule appointment for refills. 01/02/18  Yes Hilty, Nadean Corwin, MD  b complex vitamins tablet Take 1 tablet by mouth daily.   Yes [provider]  Cholecalciferol (VITAMIN D3 ADULT GUMMIES PO) Take 2,000 Units by mouth daily.   Yes [provider]  Cyanocobalamin (VITAMIN B-12) 5000 MCG SUBL Place 5,000 mcg under the tongue daily.   Yes [provider]  Dulaglutide (TRULICITY) 4.88 QB/1.6XI SOPN Inject 0.75 mg into the skin every Saturday.   Yes [provider]  feeding supplement, ENSURE ENLIVE, (ENSURE ENLIVE) LIQD Take 237 mLs by mouth 3 (three) times daily between meals. 08/17/18  Yes Oretha Milch D, MD  metFORMIN (GLUCOPHAGE-XR) 500 MG 24 hr tablet Take 500 mg by mouth 2 (two) times daily. 08/06/18  Yes [provider]  metoCLOPramide (REGLAN) 10 MG/10ML SOLN Take 10 mLs (10 mg total) by mouth 3 (three) times daily before meals. 08/17/18  Yes Oretha Milch D, MD  Metoprolol Tartrate 75 MG TABS Take 75 mg by mouth 2 (two) times daily. 08/17/18  Yes Oretha Milch D, MD  pantoprazole (PROTONIX) 40 MG tablet Take 1 tablet (40 mg total) by mouth daily. 08/17/18  Yes Oretha Milch D, MD  spironolactone (ALDACTONE) 25 MG tablet Take 1 tablet (25 mg total) by mouth daily. 08/18/18  Yes Oretha Milch D, MD  Vitamin E 400 units CHEW Chew 400 Units by mouth daily.   Yes [provider]  warfarin (COUMADIN) 5 MG tablet Tale 1.5 Tablets (7.84m)daily OR as directed by coumadin clinic Patient taking differently: Take 5-7.5 mg by mouth See admin instructions. Take 1 1/2 tablets (7.5 mg) on Monday and Friday evening, take 1 tablet (5 mg) on Sunday, Tuesday, Wednesday, Thursday, Saturday evening or as directed by coumadin clinic 05/29/18  Yes Hilty, KNadean Corwin MD  sitaGLIPtin (JANUVIA) 100 MG tablet Take 1 tablet (100 mg total) by mouth daily. Patient not taking: Reported on 08/09/2018 11/01/16   CDessa Phi DO    Family History Family History    Problem Relation Age of Onset  . Diabetes Mother   . Hypertension Mother   . Heart disease Father   . Heart attack Father   . Hypertension Father   . Diabetes Father   . Ovarian cancer Sister   . Liver cancer Sister   . Sickle cell anemia Daughter   . Schizophrenia Daughter   . Hypertension Sister   . Hypertension Brother   . Hypertension Daughter   . Kidney disease Sister        x2  . Kidney disease Brother   . Stroke Neg Hx   . Esophageal cancer Neg Hx   . Colon cancer Neg Hx   . Colon polyps Neg Hx     Social History Social History   Tobacco Use  . Smoking status: Never Smoker  . Smokeless tobacco: Never Used  Substance Use Topics  . Alcohol use: No    Alcohol/week: 0.0 standard drinks  .  Drug use: No     Allergies   Patient has no known allergies.   Review of Systems Review of Systems  All systems reviewed and negative, other than as noted in HPI.  Physical Exam Updated Vital Signs BP (!) 152/91   Pulse (!) 137   Temp 98.6 F (37 C) (Oral)   Resp (!) 26   Wt 49.9 kg   SpO2 96%   BMI 19.49 kg/m   Physical Exam  Constitutional: She appears well-developed and well-nourished. No distress.  HENT:  Head: Normocephalic and atraumatic.  Eyes: Conjunctivae are normal. Right eye exhibits no discharge. Left eye exhibits no discharge.  Neck: Neck supple.  Cardiovascular: Regular rhythm and normal heart sounds. Exam reveals no gallop and no friction rub.  No murmur heard. Tachycardic  Pulmonary/Chest: Effort normal and breath sounds normal. No respiratory distress.  Abdominal: Soft. She exhibits no distension. There is no tenderness.  Musculoskeletal: She exhibits no edema or tenderness.  Lower extremities symmetric as compared to each other. No calf tenderness. Negative Homan's. No palpable cords.   Neurological: She is alert.  Skin: Skin is warm and dry.  Psychiatric: She has a normal mood and affect. Her behavior is normal. Thought content normal.   Nursing note and vitals reviewed.    ED Treatments / Results  Labs (all labs ordered are listed, but only abnormal results are displayed) Labs Reviewed  COMPREHENSIVE METABOLIC PANEL - Abnormal; Notable for the following components:      Result Value   Potassium 3.0 (*)    Chloride 112 (*)    Glucose, Bld 178 (*)    Calcium 8.4 (*)    Total Protein 6.3 (*)    Albumin 3.4 (*)    All other components within normal limits  CBC WITH DIFFERENTIAL/PLATELET - Abnormal; Notable for the following components:   WBC 11.3 (*)    RBC 5.99 (*)    MCV 67.1 (*)    MCH 21.4 (*)    Neutro Abs 9.5 (*)    All other components within normal limits  MAGNESIUM - Abnormal; Notable for the following components:   Magnesium 1.4 (*)    All other components within normal limits  URINALYSIS, ROUTINE W REFLEX MICROSCOPIC - Abnormal; Notable for the following components:   Glucose, UA 150 (*)    Hgb urine dipstick SMALL (*)    Ketones, ur 80 (*)    Protein, ur 100 (*)    All other components within normal limits  TROPONIN I - Abnormal; Notable for the following components:   Troponin I 0.07 (*)    All other components within normal limits  PROTIME-INR  BRAIN NATRIURETIC PEPTIDE    EKG EKG Interpretation  Date/Time:  Tuesday September 01 2018 14:27:08 EDT Ventricular Rate:  137 PR Interval:    QRS Duration: 82 QT Interval:  279 QTC Calculation: 422 R Axis:   79 Text Interpretation:  Sinus tachycardia LAE, consider biatrial enlargement Left ventricular hypertrophy Borderline T abnormalities, lateral leads Confirmed by Virgel Manifold [phone]) on 09/01/2018 2:56:04 PM   Radiology Dg Chest 2 View  Result Date: 09/01/2018 CLINICAL DATA:  Generalized low abdominal pain since Sunday EXAM: CHEST - 2 VIEW COMPARISON:  08/10/2018 FINDINGS: Haziness of the bilateral chest. Chronic cardiomegaly. Coarsened lung markings without air bronchogram. No pneumothorax. Negative aortic and hilar contours.  IMPRESSION: Small left pleural effusion with atelectasis in the lower lungs. Electronically Signed   By: Monte Fantasia M.D.   On: 09/01/2018 15:34  Procedures Procedures (including critical care time)  Medications Ordered in ED Medications  morphine 4 MG/ML injection 4 mg (has no administration in time range)  0.9 %  sodium chloride infusion (has no administration in time range)  ondansetron (ZOFRAN) injection 4 mg (4 mg Intravenous Given 09/01/18 1529)     Initial Impression / Assessment and Plan / ED Course  I have reviewed the triage vital signs and the nursing notes.  Pertinent labs & imaging results that were available during my care of the patient were reviewed by me and considered in my medical decision making (see chart for details).     62 year old female with multiple complaints.  Her symptoms are essentially continuation from her recent hospitalization.  She continues to feel generally weak and c/o n/v although she describes more what sounds like regurgitation.    She has a persistent sinus tachycardia here in the emergency room with rate in the 130s to 140s despite reported compliance with metoprolol.  During her recent hospitalization she had an echocardiogram which showed an ejection fraction of 20 to 25% which was reduced from 40% on ECHO 10/2016.  I suspect a laot of her general malaise stems from this. She feels SOB but denies CP. Troponin minimally elevated, but may be from demand ischemia. BNP pending. CXR with an effusion but no overt interstitial edema.  No significant peripheral edema.  Recent TSH normal.  Supplement potassium and magnesium.  She continues to not tolerate p.o. very well.  Lot of ketones and a urinalysis.  She will need admitted for ongoing work-up and treatment.  Final Clinical Impressions(s) / ED Diagnoses   Final diagnoses:  Dyspnea, unspecified type  Nausea  Hypokalemia  Hypomagnesemia  Sinus tachycardia    ED Discharge Orders    None        Virgel Manifold, MD 09/01/18 1609

## 2018-09-01 NOTE — ED Notes (Signed)
Bed: WA02 Expected date:  Expected time:  Means of arrival:  Comments: EMS-abdominal pain

## 2018-09-01 NOTE — Progress Notes (Signed)
ANTICOAGULATION CONSULT NOTE - Initial Consult  Pharmacy Consult for warfarin  Indication: LV mural thrombus, hx of CVA  No Known Allergies  Patient Measurements: Weight: 110 lb (49.9 kg) Height: 5\' 3"    Vital Signs: Temp: 98.6 F (37 C) (10/08 1329) Temp Source: Oral (10/08 1329) BP: 120/99 (10/08 1630) Pulse Rate: 117 (10/08 1630)  Labs: Recent Labs    09/01/18 1431 09/01/18 1600  HGB 12.8  --   HCT 40.2  --   PLT 242  --   LABPROT  --  26.8*  INR  --  2.50  CREATININE 0.65  --   TROPONINI 0.07*  --     Estimated Creatinine Clearance: 56.7 mL/min (by C-G formula based on SCr of 0.65 mg/dL).   Medical History: Past Medical History:  Diagnosis Date  . Allergic rhinitis    Requires cetirizine, singulair, and fluticasone.  . Anxiety    Has been on Xanax since 2009. Uses it for stress, anxiety, and insomnia. No contract yet.  . Arthritis   . CHF (congestive heart failure) (HCC)    EF 35% after stroke, presumed ischemic  . Chronic pain    Has OA of knees B. No Xrays in echart. Not requiring narcotics.  . Colitis 12/2017  . Depression   . Diabetes mellitus    Type 2, non insulin dependent. Was dx'd prior to 2008.  Marland Kitchen Hyperglycemia   . Hyperlipemia   . Hypertension   . Sickle cell trait ([place])   . 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 Pharmacy consulted to manage warfarin dosing while patient admitted. INR on admission = 2.5. H/H, Pltc WNL. PTA dose reported as Warfarin 7.5mg  on Mondays and Fridays, 5mg  all other days of the week (last dose on 08/30/18).    Goal of Therapy:  INR 2-3 Monitor platelets by anticoagulation protocol: Yes   Plan:   Warfarin 5mg  PO x 1 as per home regimen  Daily PT/INR  Monitor CBC and for s/sx of bleeding   Lindell Spar, PharmD, BCPS Pager: [phone] 09/01/2018 5:16 PM

## 2018-09-01 NOTE — H&P (Signed)
History and Physical    Kristen Lozano HWE:[id number] DOB: 13-May-1956 DOA: 09/01/2018  PCP: Audley Hose, MD   Patient coming from: Home  Chief Complaint: Abdominal pain, nausea, vomiting  HPI: Kristen Lozano is a 62 y.o. female with medical history significant of type 2 diabetes, chronic systolic heart failure with EF of 20 to 25% by echo on 08/10/2018, moderate MR, hyperlipidemia, history of LV thrombus and MCA stroke on warfarin who comes in with diffuse abdominal pain.  Patient has recent admission from 915 2019-9 26,[id number] where she was noted to have possible small bowel enteritis and completed a course of IV Zosyn.  She is given antiemetics.  She was also noted to have new onset acute on chronic systolic heart failure with a newly decreased EF.  It was decided to defer her ischemic work-up to outpatient.  Additionally she was noted to be persistently sinus tachycardic with a normal TSH and this additionally was deferred to outpatient work-up by EP.  Unfortunately patient is an extremely poor historian and can only tell me that her symptoms started approximately 2 weeks ago.  She reports that she has had intermittent abdominal pain that she cannot characterize.  She describes it predominantly over the epigastric and periumbilical area.  She reports the pain is not sharp but occasionally dull.  She did not present earlier because the pain would wax and wane and would improve.  She could not point to any alleviating or exacerbating factors.  She reports yesterday she began to have episodes of nonbilious nonbloody emesis.  She has had constipation but no diarrhea.  She denies any fevers but reports feeling "hot".  She has not had any cough, congestion, rhinorrhea, shortness of breath, orthopnea, lower extremity edema, PND, syncope/presyncope.  ED Course: In the emergency room patient's vitals are notable for a heart rate of 139.  Patient was noted to be persistently in the 110s to 130s.  Her labs  were notable for a white count 11.3.  Troponin was 0.07.  INR was therapeutic at 2.5.  Potassium was 3.0 magnesium was 1.4.  CT abdomen pelvis with contrast is pending.  X-ray showed only small left pleural effusion.  Review of Systems: As per HPI otherwise 10 point review of systems negative.    Past Medical History:  Diagnosis Date  . Allergic rhinitis    Requires cetirizine, singulair, and fluticasone.  . Anxiety    Has been on Xanax since 2009. Uses it for stress, anxiety, and insomnia. No contract yet.  . Arthritis   . CHF (congestive heart failure) (HCC)    EF 35% after stroke, presumed ischemic  . Chronic pain    Has OA of knees B. No Xrays in echart. Not requiring narcotics.  . Colitis 12/2017  . Depression   . Diabetes mellitus    Type 2, non insulin dependent. Was dx'd prior to 2008.  Marland Kitchen Hyperglycemia   . Hyperlipemia   . Hypertension   . Sickle cell trait ([place])   . Stroke [facility]) 09/05/14   Dominant left MCA infarcts secondary to unknown embolic source     Past Surgical History:  Procedure Laterality Date  . TEE WITHOUT CARDIOVERSION N/A 09/06/2014   Procedure: TRANSESOPHAGEAL ECHOCARDIOGRAM (TEE);  Surgeon: Pixie Casino, MD;  Location: [facility] ENDOSCOPY;  Service: Cardiovascular;  Laterality: N/A;     reports that she has never smoked. She has never used smokeless tobacco. She reports that she does not drink alcohol or use drugs.  No  Known Allergies  Family History  Problem Relation Age of Onset  . Diabetes Mother   . Hypertension Mother   . Heart disease Father   . Heart attack Father   . Hypertension Father   . Diabetes Father   . Ovarian cancer Sister   . Liver cancer Sister   . Sickle cell anemia Daughter   . Schizophrenia Daughter   . Hypertension Sister   . Hypertension Brother   . Hypertension Daughter   . Kidney disease Sister        x2  . Kidney disease Brother   . Stroke Neg Hx   . Esophageal cancer Neg Hx   . Colon cancer Neg Hx   . Colon  polyps Neg Hx    Unacceptable: Noncontributory, unremarkable, or negative. Acceptable: Family history reviewed and not pertinent (If you reviewed it)  Prior to Admission medications   Medication Sig Start Date End Date Taking? Authorizing Provider  Ascorbic Acid (VITAMIN C GUMMIE PO) Take 250 mg by mouth daily.   Yes [provider]  ASPIRIN LOW DOSE 81 MG EC tablet Take 1 tablet (81 mg total) by mouth daily. Patient taking differently: Take 81 mg by mouth daily.  01/22/18  Yes Hilty, Nadean Corwin, MD  atorvastatin (LIPITOR) 20 MG tablet Take 1 tablet (20 mg total) by mouth daily at 6 PM. Please schedule appointment for refills. 01/02/18  Yes Hilty, Nadean Corwin, MD  b complex vitamins tablet Take 1 tablet by mouth daily.   Yes [provider]  Cholecalciferol (VITAMIN D3 ADULT GUMMIES PO) Take 2,000 Units by mouth daily.   Yes [provider]  Cyanocobalamin (VITAMIN B-12) 5000 MCG SUBL Place 5,000 mcg under the tongue daily.   Yes [provider]  Dulaglutide (TRULICITY) 6.76 HM/0.9OB SOPN Inject 0.75 mg into the skin every Saturday.   Yes [provider]  feeding supplement, ENSURE ENLIVE, (ENSURE ENLIVE) LIQD Take 237 mLs by mouth 3 (three) times daily between meals. 08/17/18  Yes Oretha Milch D, MD  metFORMIN (GLUCOPHAGE-XR) 500 MG 24 hr tablet Take 500 mg by mouth 2 (two) times daily. 08/06/18  Yes [provider]  metoCLOPramide (REGLAN) 10 MG/10ML SOLN Take 10 mLs (10 mg total) by mouth 3 (three) times daily before meals. 08/17/18  Yes Oretha Milch D, MD  Metoprolol Tartrate 75 MG TABS Take 75 mg by mouth 2 (two) times daily. 08/17/18  Yes Oretha Milch D, MD  pantoprazole (PROTONIX) 40 MG tablet Take 1 tablet (40 mg total) by mouth daily. 08/17/18  Yes Oretha Milch D, MD  spironolactone (ALDACTONE) 25 MG tablet Take 1 tablet (25 mg total) by mouth daily. 08/18/18  Yes Oretha Milch D, MD  Vitamin E 400 units CHEW Chew 400 Units by mouth  daily.   Yes [provider]  warfarin (COUMADIN) 5 MG tablet Tale 1.5 Tablets (7.5mg )daily OR as directed by coumadin clinic Patient taking differently: Take 5-7.5 mg by mouth See admin instructions. Take 1 1/2 tablets (7.5 mg) on Monday and Friday evening, take 1 tablet (5 mg) on Sunday, Tuesday, Wednesday, Thursday, Saturday evening or as directed by coumadin clinic 05/29/18  Yes Hilty, Nadean Corwin, MD  sitaGLIPtin (JANUVIA) 100 MG tablet Take 1 tablet (100 mg total) by mouth daily. Patient not taking: Reported on 08/09/2018 11/01/16   Dessa Phi, DO    Physical Exam: Vitals:   09/01/18 1545 09/01/18 1600 09/01/18 1615 09/01/18 1630  BP: (!) 143/82 (!) 143/81 (!) 142/88 (!) 120/99  Pulse: (!) 126 (!) 129 (!) 116 (!) 117  Resp: 20 18 15 13   Temp:      TempSrc:      SpO2: (!) 89% 95% 96% 96%  Weight:        Constitutional: No acute distress, older than stated age Vitals:   09/01/18 1545 09/01/18 1600 09/01/18 1615 09/01/18 1630  BP: (!) 143/82 (!) 143/81 (!) 142/88 (!) 120/99  Pulse: (!) 126 (!) 129 (!) 116 (!) 117  Resp: 20 18 15 13   Temp:      TempSrc:      SpO2: (!) 89% 95% 96% 96%  Weight:       Eyes: Anicteric sclera ENMT: Mucous membranes are moist.  Poor dentition Neck: normal, supple Respiratory: clear to auscultation bilaterally, no wheezing, no crackles. Normal respiratory effort. No accessory muscle use.  Cardiovascular: Tachycardia, regular rhythm, 2 out of 6 systolic murmur Abdomen: Soft, diminished bowel sounds, no rebound or guarding, mildly tender to deep palpation.  Musculoskeletal: No lower extremity edema Skin: no rashes over visible skin Neurologic: Grossly intact, moving all extremities Psychiatric: Normal judgment and insight. Alert and oriented x 3. Normal mood.   Labs on Admission: I have personally reviewed following labs and imaging studies  CBC: Recent Labs  Lab 09/01/18 1431  WBC 11.3*  NEUTROABS 9.5*  HGB 12.8  HCT 40.2  MCV  67.1*  PLT 638   Basic Metabolic Panel: Recent Labs  Lab 09/01/18 1431  NA 145  K 3.0*  CL 112*  CO2 22  GLUCOSE 178*  BUN 10  CREATININE 0.65  CALCIUM 8.4*  MG 1.4*   GFR: Estimated Creatinine Clearance: 56.7 mL/min (by C-G formula based on SCr of 0.65 mg/dL). Liver Function Tests: Recent Labs  Lab 09/01/18 1431  AST 21  ALT 31  ALKPHOS 40  BILITOT 1.2  PROT 6.3*  ALBUMIN 3.4*   No results for input(s): LIPASE, AMYLASE in the last 168 hours. No results for input(s): AMMONIA in the last 168 hours. Coagulation Profile: Recent Labs  Lab 09/01/18 1600  INR 2.50   Cardiac Enzymes: Recent Labs  Lab 09/01/18 1431  TROPONINI 0.07*   BNP (last 3 results) No results for input(s): PROBNP in the last 8760 hours. HbA1C: No results for input(s): HGBA1C in the last 72 hours. CBG: No results for input(s): GLUCAP in the last 168 hours. Lipid Profile: No results for input(s): CHOL, HDL, LDLCALC, TRIG, CHOLHDL, LDLDIRECT in the last 72 hours. Thyroid Function Tests: No results for input(s): TSH, T4TOTAL, FREET4, T3FREE, THYROIDAB in the last 72 hours. Anemia Panel: No results for input(s): VITAMINB12, FOLATE, FERRITIN, TIBC, IRON, RETICCTPCT in the last 72 hours. Urine analysis:    Component Value Date/Time   COLORURINE YELLOW 09/01/2018 [place] 09/01/2018 1431   LABSPEC 1.021 09/01/2018 1431   PHURINE 5.0 09/01/2018 1431   GLUCOSEU 150 (A) 09/01/2018 1431   HGBUR SMALL (A) 09/01/2018 1431   BILIRUBINUR NEGATIVE 09/01/2018 1431   KETONESUR 80 (A) 09/01/2018 1431   PROTEINUR 100 (A) 09/01/2018 1431   UROBILINOGEN 1.0 09/07/2014 1405   NITRITE NEGATIVE 09/01/2018 1431   LEUKOCYTESUR NEGATIVE 09/01/2018 1431    Radiological Exams on Admission: Dg Chest 2 View  Result Date: 09/01/2018 CLINICAL DATA:  Generalized low abdominal pain since Sunday EXAM: CHEST - 2 VIEW COMPARISON:  08/10/2018 FINDINGS: Haziness of the bilateral chest. Chronic  cardiomegaly. Coarsened lung markings without air bronchogram. No pneumothorax. Negative aortic and hilar contours. IMPRESSION: Small left  pleural effusion with atelectasis in the lower lungs. Electronically Signed   By: Monte Fantasia M.D.   On: 09/01/2018 15:34    EKG: Independently reviewed.  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  She has no evidence of UTI at this time.  Certainly a viral infectious cause such as viral gastroenteritis could be the culprit.  Additionally the patient could have incompletely treated enteritis though she did complete a 7-day course of Zosyn.  Most concerning the if she does have a mural thrombus is ischemic colitis or enteritis.  At this time she does not have any signs or symptoms does not appear to be particularly ill Arin extremis. -We will follow-up CT scan -Gentle IV fluids -Clear liquid diet -Antiemetics  #) Electrolyte abnormalities: - Supplement aggressively -Telemetry  #) Elevated troponin/tachycardia: Suspect elevated troponin is likely mediated secondary to tachycardia.  She is only recently had an echo.  She will need an outpatient ischemic evaluation.  Additionally suspect that her tachycardia is acute on chronic with a component of her underlying sinus tachycardia and superimposed pain.  Per review of the  chart patient was evaluated by cardiology who felt that her cardiomyopathy might be partially mediated by this tachyarrhythmia.  Additionally while she is on a beta-blocker she might benefit from being on ergonolovine. -Continue metoprolol tartrate 75 mg twice daily -Cycle troponins  #) Chronic systolic heart failure: Patient apparently has not had an cardiac catheterization as this was referred to outpatient. -Strict ins and outs -Judicious fluid use -Continue spironolactone 25 mg daily -Continue beta-blocker  #) LV mural thrombus/CVA: -Continue warfarin, pharmacy consult -Continue aspirin 81 mg daily -Continue atorvastatin 20 mg nightly  #) Type 2 diabetes: - Hold dulaglutide weekly -Hold metformin 500 mg twice daily -Hold sitagliptin 100 mg daily -Sliding scale insulin, AC at bedtime  Fluids: Gentle IV fluids Electrodes: Monitor and supplement Nutrition: Clear liquid diet  Prophylaxis: On warfarin  Disposition: Pending improvement in abdominal pain and tolerating p.o.  Full code   Cristy Folks MD [facility]  If 7PM-7AM, please contact night-coverage [email or web address] Password [facility]  09/01/2018, 4:36 PM

## 2018-09-02 ENCOUNTER — Encounter: Payer: Self-pay | Admitting: *Deleted

## 2018-09-02 DIAGNOSIS — R110 Nausea: Secondary | ICD-10-CM

## 2018-09-02 DIAGNOSIS — R0600 Dyspnea, unspecified: Secondary | ICD-10-CM

## 2018-09-02 DIAGNOSIS — R000 Tachycardia, unspecified: Secondary | ICD-10-CM

## 2018-09-02 DIAGNOSIS — E876 Hypokalemia: Secondary | ICD-10-CM | POA: Diagnosis not present

## 2018-09-02 DIAGNOSIS — R109 Unspecified abdominal pain: Secondary | ICD-10-CM | POA: Diagnosis not present

## 2018-09-02 LAB — CBC
HCT: 42.6 % (ref 36.0–46.0)
Hemoglobin: 13.3 g/dL (ref 12.0–15.0)
MCH: 21.8 pg — ABNORMAL LOW (ref 26.0–34.0)
MCHC: 31.2 g/dL (ref 30.0–36.0)
MCV: 69.8 fL — ABNORMAL LOW (ref 80.0–100.0)
Platelets: 256 K/uL (ref 150–400)
RBC: 6.1 MIL/uL — ABNORMAL HIGH (ref 3.87–5.11)
RDW: 17 % — ABNORMAL HIGH (ref 11.5–15.5)
WBC: 10.4 K/uL (ref 4.0–10.5)
nRBC: 0 % (ref 0.0–0.2)

## 2018-09-02 LAB — PROTIME-INR
INR: 2.54
Prothrombin Time: 27.1 s — ABNORMAL HIGH (ref 11.4–15.2)

## 2018-09-02 LAB — GLUCOSE, CAPILLARY
Glucose-Capillary: 253 mg/dL — ABNORMAL HIGH (ref 70–99)
Glucose-Capillary: 252 mg/dL — ABNORMAL HIGH (ref 70–99)

## 2018-09-02 LAB — BASIC METABOLIC PANEL
Anion gap: 11 (ref 5–15)
CO2: 24 mmol/L (ref 22–32)
Chloride: 105 mmol/L (ref 98–111)
GFR calc non Af Amer: >60 mL/min (ref >60)
Glucose, Bld: 269 mg/dL — ABNORMAL HIGH (ref 70–99)
Potassium: 4.4 mmol/L (ref 3.5–5.1)

## 2018-09-02 LAB — BASIC METABOLIC PANEL WITH GFR
BUN: 10 mg/dL (ref 8–23)
Calcium: 9 mg/dL (ref 8.9–10.3)
Creatinine, Ser: 0.93 mg/dL (ref 0.44–1.00)
GFR calc Af Amer: >60 mL/min (ref >60)
Sodium: 140 mmol/L (ref 135–145)

## 2018-09-02 LAB — TROPONIN I
Troponin I: 0.05 ng/mL (ref <0.03)
Troponin I: 0.04 ng/mL (ref <0.03)

## 2018-09-02 LAB — MAGNESIUM: Magnesium: 2.4 mg/dL (ref 1.7–2.4)

## 2018-09-02 MED ORDER — ONDANSETRON HCL 4 MG PO TABS: 4.0000 mg | ORAL_TABLET | Freq: Four times a day (QID) | ORAL | 0 refills | Status: DC | PRN

## 2018-09-02 MED ORDER — WARFARIN SODIUM 5 MG PO TABS: 7.5000 mg | ORAL_TABLET | ORAL | Status: DC

## 2018-09-02 MED ORDER — WARFARIN SODIUM 5 MG PO TABS: 5.0000 mg | ORAL_TABLET | ORAL | Status: DC

## 2018-09-02 MED ORDER — POTASSIUM CHLORIDE ER 10 MEQ PO TBCR: 10.0000 meq | EXTENDED_RELEASE_TABLET | Freq: Every day | ORAL | 0 refills | Status: AC

## 2018-09-02 MED ORDER — FUROSEMIDE 10 MG/ML IJ SOLN
20.0000 mg | Freq: Once | INTRAMUSCULAR | Status: AC
  Administered 2018-09-02: 20 mg via INTRAVENOUS
  Filled 2018-09-02: qty 2

## 2018-09-02 MED ORDER — FUROSEMIDE 20 MG PO TABS: 20.0000 mg | ORAL_TABLET | Freq: Every day | ORAL | 11 refills | Status: DC

## 2018-09-02 MED ORDER — POTASSIUM CHLORIDE CRYS ER 20 MEQ PO TBCR
20.0000 meq | EXTENDED_RELEASE_TABLET | Freq: Once | ORAL | Status: AC
  Administered 2018-09-02: 20 meq via ORAL
  Filled 2018-09-02: qty 1

## 2018-09-02 NOTE — Care Management CC44 (Signed)
Condition Code 44 Documentation Completed  Patient Details  Name: MELYSSA SIGNOR MRN: [id number] Date of Birth: 01-24-1956   Condition Code 44 given:   yes Patient signature on Condition Code 44 notice:   No. Pt could not sign, Pt's husband not comfortable signing.  Documentation of 2 MD's agreement:    yes Code 44 added to claim:   yes    Oluwatimileyin Vivier, RN 09/02/2018, 2:41 PM

## 2018-09-02 NOTE — Care Management Note (Signed)
Case Management Note  Patient Details  Name: Kristen Lozano MRN: [id number] Date of Birth: 1956-08-07  Subjective/Objective:                    Action/Plan: Pt discharging home with [place].   Expected Discharge Date:  09/02/18               Expected Discharge Plan:  [place]  In-House Referral:     Discharge planning Services  CM Consult  Post Acute Care Choice:    Choice offered to:  Patient  DME Arranged:    DME Agency:     HH Arranged:  RN, PT, OT, Nurse's Aide [place] Agency:  [place]  Status of Service:  Completed, signed off  If discussed at [place] of Stay Meetings, dates discussed:    Additional CommentsPurcell Mouton, RN 09/02/2018, 12:57 PM

## 2018-09-02 NOTE — Progress Notes (Signed)
ANTICOAGULATION CONSULT NOTE Pharmacy Consult for warfarin  Indication: LV mural thrombus, hx of CVA  No Known Allergies  Patient Measurements: Weight: 110 lb (49.9 kg) Height: 5\' 3"    Vital Signs: Temp: 98.2 F (36.8 C) (10/09 0425) Temp Source: Oral (10/09 0425) BP: 113/81 (10/09 0425) Pulse Rate: 107 (10/09 0425)  Labs: Recent Labs    09/01/18 1431 09/01/18 1600 09/01/18 1836 09/02/18 0051 09/02/18 0618  HGB 12.8  --   --  13.3  --   HCT 40.2  --   --  42.6  --   PLT 242  --   --  256  --   LABPROT  --  26.8*  --  27.1*  --   INR  --  2.50  --  2.54  --   CREATININE 0.65  --   --  0.93  --   TROPONINI 0.07*  --  0.07* 0.05* 0.04*    Estimated Creatinine Clearance: 48.8 mL/min (by C-G formula based on SCr of 0.93 mg/dL).  Assessment: 40 y/oF with PMH of LV mural thrombus and CVA on warfarin therapy who presented to [facility] ED on 09/01/18 with abdominal pan, nausea, and vomiting. Pharmacy consulted to manage warfarin dosing while patient admitted. INR on admission = 2.5. H/H, Pltc WNL. PTA dose reported as Warfarin 7.5mg  on Mondays and Fridays, 5mg  all other days of the week (last dose on 08/30/18).  09/02/2018 INR is therapeutic at 2.54, CBC stable, no bleeding reported, no drug interactions noted  Goal of Therapy:  INR 2-3 Monitor platelets by anticoagulation protocol: Yes   Plan:   Continue home regimen 7.5 mg M/F and 5 mg all other days  Daily PT/INR  Monitor CBC and for s/sx of bleeding  Eudelia Bunch, Pharm.D [phone] 09/02/2018 7:31 AM

## 2018-09-02 NOTE — Evaluation (Signed)
Physical Therapy Evaluation Patient Details Name: Kristen Lozano MRN: [id number] DOB: [birth year] Today's Date: 09/02/2018   History of Present Illness  62 yo female admitted with abd pain, N/V, tachycardia. Hx of CVA, DM, HTN, depression, chronic pain, falls, CHF  Clinical Impression  On eval, pt was Min guard assist for mobility. She walked ~125 feet with a RW. No LOB during session. Pt is very flat and has some difficulty providing history at times. No family present during session. Will follow during hospital stay.    Follow Up Recommendations Home health PT;Supervision/Assistance - Intermittent    Equipment Recommendations  None recommended by PT    Recommendations for Other Services       Precautions / Restrictions Precautions Precautions: Fall Restrictions Weight Bearing Restrictions: No      Mobility  Bed Mobility Overal bed mobility: Modified Independent                Transfers Overall transfer level: Modified independent Equipment used: Rolling walker (2 wheeled) Transfers: Sit to/from Stand              Ambulation/Gait Ambulation/Gait assistance: Min guard Gait Distance (Feet): 125 Feet(15) Assistive device: Rolling walker (2 wheeled);None Gait Pattern/deviations: Step-through pattern;Decreased stride length     General Gait Details: Close guard for safety. Fair gait speed. No LOB with RW use or while walking short distance from bathroom to bed without a device  Stairs            Wheelchair Mobility    Modified Rankin (Stroke Patients Only)       Balance Overall balance assessment: Needs assistance           Standing balance-Leahy Scale: Fair                               Pertinent Vitals/Pain Pain Assessment: No/denies pain    Home Living Family/patient expects to be discharged to:: Private residence Living Arrangements: Alone Available Help at Discharge: Family;Available PRN/intermittently Type of Home:  House Home Access: Level entry     Home Layout: One level Home Equipment: Cane - single point;Shower seat;Walker - 2 wheels      Prior Function Level of Independence: Needs assistance   Gait / Transfers Assistance Needed: pt reports she walks with a cane vs RW in the house  ADL's / Homemaking Assistance Needed: family assists as needed. Ind with ADLs.        Hand Dominance        Extremity/Trunk Assessment   Upper Extremity Assessment Upper Extremity Assessment: Overall WFL for tasks assessed    Lower Extremity Assessment Lower Extremity Assessment: Overall WFL for tasks assessed    Cervical / Trunk Assessment Cervical / Trunk Assessment: Normal  Communication   Communication: No difficulties  Cognition Arousal/Alertness: Awake/alert Behavior During Therapy: Flat affect Overall Cognitive Status: No family/caregiver present to determine baseline cognitive functioning                                 General Comments: Per chart review, pt has lower education/comprehension level at baseline with poor insight into medical issues.      General Comments      Exercises     Assessment/Plan    PT Assessment Patient needs continued PT services  PT Problem List Decreased balance;Decreased mobility;Decreased safety awareness;Decreased cognition  PT Treatment Interventions Gait training;Functional mobility training;DME instruction;Balance training;Patient/family education;Therapeutic activities;Therapeutic exercise    PT Goals (Current goals can be found in the Care Plan section)  Acute Rehab PT Goals Patient Stated Goal: home PT Goal Formulation: With patient Time For Goal Achievement: 09/16/18 Potential to Achieve Goals: Good    Frequency Min 3X/week   Barriers to discharge        Co-evaluation               AM-PAC PT "6 Clicks" Daily Activity  Outcome Measure Difficulty turning over in bed (including adjusting bedclothes, sheets  and blankets)?: None Difficulty moving from lying on back to sitting on the side of the bed? : None Difficulty sitting down on and standing up from a chair with arms (e.g., wheelchair, bedside commode, etc,.)?: None Help needed moving to and from a bed to chair (including a wheelchair)?: A Little Help needed walking in hospital room?: A Little Help needed climbing 3-5 steps with a railing? : A Little 6 Click Score: 21    End of Session Equipment Utilized During Treatment: Gait belt Activity Tolerance: Patient tolerated treatment well Patient left: in bed;with call bell/phone within reach;with bed alarm set   PT Visit Diagnosis: Unsteadiness on feet (R26.81)    Time: 1051-1101 PT Time Calculation (min) (ACUTE ONLY): 10 min   Charges:   PT Evaluation $PT Eval Moderate Complexity: [place], PT Acute Rehabilitation Services Pager: [phone] Office: [phone]

## 2018-09-02 NOTE — Progress Notes (Signed)
Pt discharged to home, instructions reviewed with pt and care giver, acknowledged understanding. Will cont to monitor. SRP, RN

## 2018-09-02 NOTE — Progress Notes (Signed)
[facility]  Patient Status: Active (receiving services up to time of hospitalization)  [facility] is providing the following services: RN, PT and OT  If patient discharges after hours, please call [phone].   Edwinna Areola 09/02/2018, 10:40 AM

## 2018-09-02 NOTE — Discharge Summary (Signed)
Physician Discharge Summary  GENELDA ROARK ESP:[id number] DOB: 07/22/1956 DOA: 09/01/2018  PCP: Audley Hose, MD  Admit date: 09/01/2018 Discharge date: 09/02/2018  Admitted From: Home Disposition: Home   Recommendations for Outpatient Follow-up:  1. Follow up with PCP in 1-2 weeks 2. Please obtain BMP/CBC in one week 3. Please follow up with cardiologist  Home Health: [facility] PT OT and RN Equipment/Devices: None  Discharge Condition stable CODE STATUS: Full code  Brief/Interim Summary: 62 y.o. female with medical history significant of type 2 diabetes, chronic systolic heart failure with EF of 20 to 25% by echo on 08/10/2018, moderate MR, hyperlipidemia, history of LV thrombus and MCA stroke on warfarin who comes in with diffuse abdominal pain.  Patient has recent admission from 915 2019-9 26,019 where she was noted to have possible small bowel enteritis and completed a course of IV Zosyn.  She is given antiemetics.  She was also noted to have new onset acute on chronic systolic heart failure with a newly decreased EF.  It was decided to defer her ischemic work-up to outpatient.  Additionally she was noted to be persistently sinus tachycardic with a normal TSH and this additionally was deferred to outpatient work-up by EP.  Unfortunately patient is an extremely poor historian and can only tell me that her symptoms started approximately 2 weeks ago.  She reports that she has had intermittent abdominal pain that she cannot characterize.  She describes it predominantly over the epigastric and periumbilical area.  She reports the pain is not sharp but occasionally dull.  She did not present earlier because the pain would wax and wane and would improve.  She could not point to any alleviating or exacerbating factors.  She reports yesterday she began to have episodes of nonbilious nonbloody emesis.  She has had constipation but no diarrhea.  She denies any fevers but reports feeling  "hot".  She has not had any cough, congestion, rhinorrhea, shortness of breath, orthopnea, lower extremity edema, PND, syncope/presyncope.  ED Course: In the emergency room patient's vitals are notable for a heart rate of 139.  Patient was noted to be persistently in the 110s to 130s.  Her labs were notable for a white count 11.3.  Troponin was 0.07.  INR was therapeutic at 2.5.  Potassium was 3.0 magnesium was 1.4.  CT abdomen pelvis with contrast is pending.  X-ray showed only small left pleural effusion.  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  And she was anxious to go home.  Her ex-husband was by the bedside.  Abdomen was soft on exam.  Evidence of urinary tract infection noted.  She tolerated a regular diet prior to discharge.  Her ex-husband reported that patient does not eat much anyway at home.  #2 chronic systolic heart failure patient had no symptoms.  She denied chest pain shortness of breath or dyspnea on exertion.  But during the work-up it was found she has bilateral pleural effusion.  She was on room air saturating above 94%.  She will be on Lasix 20 mg daily with potassium replacement.  Follow-up with Dr. Debara Pickett as an outpatient.  Patient has a left mural thrombus and history of stroke we will continue Coumadin aspirin and statin.  #3 type 2 diabetes continue home medications.  Discharge  Instructions  Discharge Instructions    Call MD for:  difficulty breathing, headache or visual disturbances   Complete by:  As directed    Call MD for:  persistant dizziness or light-headedness   Complete by:  As directed    Call MD for:  persistant nausea and vomiting   Complete by:  As directed    Call MD for:  severe uncontrolled pain   Complete by:  As directed    Diet - low sodium heart healthy   Complete by:  As directed    Face-to-face encounter (required for Medicare/Medicaid patients)   Complete by:  As directed    I Georgette Shell certify that this patient is under my care and that I, or a nurse practitioner or physician's assistant working with me, had a face-to-face encounter that meets the physician face-to-face encounter requirements with this patient on 09/02/2018. The encounter with the patient was in whole, or in part for the following medical condition(s) which is the primary reason for home health care (List medical condition):   The encounter with the patient was in whole, or in part, for the following medical condition, which is the primary reason for home health care:  chf enteritis   I certify that, based on my findings, the following services are medically necessary home health services:   Nursing Physical therapy     Reason for Medically Necessary Home Health Services:  Therapy- Personnel officer, Public librarian   My clinical findings support the need for the above services:  Unable to leave home safely without assistance and/or assistive device   Further, I certify that my clinical findings support that this patient is homebound due to:  Unable to leave home safely without assistance   Home Health   Complete by:  As directed    To provide the following care/treatments:   PT OT RN     Increase activity slowly   Complete by:  As directed      Allergies as of 09/02/2018   No Known Allergies     Medication List    STOP taking these  medications   metoCLOPramide 10 MG/10ML Soln Commonly known as:  REGLAN   Metoprolol Tartrate 75 MG Tabs   sitaGLIPtin 100 MG tablet Commonly known as:  JANUVIA   spironolactone 25 MG tablet Commonly known as:  ALDACTONE     TAKE these medications   ASPIRIN LOW DOSE 81 MG EC tablet Generic drug:  aspirin Take 1 tablet (81 mg total) by mouth daily. What changed:  See the new instructions.   atorvastatin 20 MG tablet Commonly known as:  LIPITOR Take 1 tablet (20 mg total) by mouth daily at 6 PM. Please schedule appointment for refills.   b complex vitamins tablet Take 1 tablet by mouth daily.   feeding supplement (ENSURE ENLIVE) Liqd Take 237 mLs by mouth 3 (three) times daily between meals.   furosemide 20 MG tablet Commonly known as:  LASIX Take 1 tablet (20 mg total) by mouth daily.   metFORMIN 500 MG 24 hr tablet  Commonly known as:  GLUCOPHAGE-XR Take 500 mg by mouth 2 (two) times daily.   ondansetron 4 MG tablet Commonly known as:  ZOFRAN Take 1 tablet (4 mg total) by mouth every 6 (six) hours as needed for nausea.   pantoprazole 40 MG tablet Commonly known as:  PROTONIX Take 1 tablet (40 mg total) by mouth daily.   potassium chloride 10 MEQ tablet Commonly known as:  K-DUR Take 1 tablet (10 mEq total) by mouth daily.   TRULICITY 2.29 NL/8.9QJ Sopn Generic drug:  Dulaglutide Inject 0.75 mg into the skin every Saturday.   Vitamin B-12 5000 MCG Subl Place 5,000 mcg under the tongue daily.   VITAMIN C GUMMIE PO Take 250 mg by mouth daily.   VITAMIN D3 ADULT GUMMIES PO Take 2,000 Units by mouth daily.   Vitamin E 400 units Chew Chew 400 Units by mouth daily.   warfarin 5 MG tablet Commonly known as:  COUMADIN Take as directed. If you are unsure how to take this medication, talk to your nurse or doctor. Original instructions:  Tale 1.5 Tablets (7.50m)daily OR as directed by coumadin clinic What changed:  See the new instructions.      Follow-up  Information    Bakare, MLarey Dresser MD Follow up.   Specialty:  Internal Medicine Contact information: [phone]       HPixie Casino MD .   Specialty:  Cardiology Contact information: [phone]         No Known Allergies  Consultations:  Procedures/Studies: Dg Chest 2 View  Result Date: 09/01/2018 CLINICAL DATA:  Generalized low abdominal pain since Sunday EXAM: CHEST - 2 VIEW COMPARISON:  08/10/2018 FINDINGS: Haziness of the bilateral chest. Chronic cardiomegaly. Coarsened lung markings without air bronchogram. No pneumothorax. Negative aortic and hilar contours. IMPRESSION: Small left pleural effusion with atelectasis in the lower lungs. Electronically Signed   By: JMonte FantasiaM.D.   On: 09/01/2018 15:34   Dg Chest 2 View  Result Date: 08/09/2018 CLINICAL DATA:  Generalized abdominal pain. Nausea, vomiting and diarrhea. EXAM: CHEST - 2 VIEW COMPARISON:  Chest x-ray dated 09/04/2014. FINDINGS: Cardiomegaly, partially obscured by dense bibasilar opacities. Upper lungs are relatively clear. Osseous structures about the chest are unremarkable. IMPRESSION: 1. Dense bibasilar opacities, suspicious for bilateral pneumonia, less likely asymmetric pulmonary edema. 2. Probable bilateral pleural effusions, small to moderate in size. 3. Probable cardiomegaly, difficult to definitively characterize due to obscuration by the bibasilar opacities. Electronically Signed   By: SFranki CabotM.D.   On: 08/09/2018 14:11   Ct Abdomen Pelvis W Contrast  Result Date: 09/01/2018 CLINICAL DATA:  Acute generalized abdominal pain. EXAM: CT ABDOMEN AND PELVIS WITH CONTRAST TECHNIQUE: Multidetector CT imaging of the abdomen and pelvis was performed using the standard protocol following bolus administration of intravenous contrast. CONTRAST:  1025mISOVUE-300 IOPAMIDOL (ISOVUE-300) INJECTION  61% COMPARISON:  08/09/2018 CT FINDINGS: Lower chest: Stable cardiomegaly with moderate volume bilateral pleural effusions and compressive atelectasis as before. Hepatobiliary: Hepatic steatosis. No space-occupying mass. Physiologic distention of the gallbladder without stones. No biliary dilatation. Pancreas: Unremarkable. No pancreatic ductal dilatation or surrounding inflammatory changes. Spleen: Normal in size without focal abnormality. Adrenals/Urinary Tract: Normal bilateral adrenal glands. Scattered areas of bilateral renal cortical scarring are redemonstrated with subcentimeter hypodensities too small to further characterize but statistically consistent with cysts. No hydroureteronephrosis. The urinary bladder is unremarkable for the degree  of distention. Stomach/Bowel: Stomach is within normal limits. Appendix appears normal. No evidence of bowel wall thickening, distention, or inflammatory changes. Vascular/Lymphatic: No significant vascular findings are present. No enlarged abdominal or pelvic lymph nodes. Reproductive: Uterus and bilateral adnexa are unremarkable. Other: Trace physiologic free fluid in pelvis. Musculoskeletal: No acute or significant osseous findings. IMPRESSION: 1. No acute bowel obstruction or inflammatory change noted. 2. Stable cardiomegaly with moderate bilateral pleural effusions with compressive atelectasis. 3. Bilateral renal cortical scarring with too small to characterize subcentimeter hypodensities statistically consistent with cysts. Electronically Signed   By: Ashley Royalty M.D.   On: 09/01/2018 17:08   Ct Abdomen Pelvis W Contrast  Result Date: 08/09/2018 CLINICAL DATA:  Abdominal pain with nausea, vomiting and diarrhea 3 days. EXAM: CT ABDOMEN AND PELVIS WITH CONTRAST TECHNIQUE: Multidetector CT imaging of the abdomen and pelvis was performed using the standard protocol following bolus administration of intravenous contrast. CONTRAST:  130m OMNIPAQUE IOHEXOL 300 MG/ML   SOLN COMPARISON:  01/09/2018 and 09/07/2014 FINDINGS: Lower chest: Examination demonstrates moderate size bilateral pleural effusions with associated compressive atelectasis in the lung bases. Mild cardiomegaly. Hepatobiliary: Mild diffuse low-attenuation of the liver without focal mass. Gallbladder and biliary tree are normal. Pancreas: Normal. Spleen: Normal. Adrenals/Urinary Tract: Adrenal glands are normal. Kidneys are normal in size with patchy bilateral renal cortical scarring. Few small subcentimeter bilateral cortical hypodensities too small to characterize but likely cysts. No hydronephrosis or nephrolithiasis. Bladder and ureters are normal. Stomach/Bowel: Stomach is within normal. Mild wall thickening of several small bowel loops over the left lower quadrant which may be due to regional enteritis of infectious or inflammatory nature. No bowel obstruction. Appendix is normal. Mild decompression of the transverse and descending colon as the colon is otherwise unremarkable. Vascular/Lymphatic: Normal. Reproductive: Normal. Other: No free fluid or focal inflammatory change. Musculoskeletal: Unremarkable. IMPRESSION: Few small bowel loops over the left lower quadrant with wall thickening which may be due to a regional enteritis of infectious or inflammatory nature. No bowel obstruction. Moderate size bilateral pleural effusions with bibasilar atelectasis. Bilateral renal cortical scarring. Couple small subcentimeter bilateral renal cortical hypodensities too small to characterize but likely cysts. Attic steatosis. Mild cardiomegaly. Electronically Signed   By: DMarin OlpM.D.   On: 08/09/2018 16:46   Dg Abd Acute W/chest  Result Date: 08/10/2018 CLINICAL DATA:  Abdomen pain with nausea and vomiting EXAM: DG ABDOMEN ACUTE W/ 1V CHEST COMPARISON:  CT 08/09/2018, radiograph 08/09/2018 FINDINGS: Single-view chest demonstrates cardiomegaly with vascular congestion, bilateral pleural effusion, and bibasilar  consolidations. Increased peripheral opacity in the right mid lung. Supine and upright views of the abdomen demonstrate no free air beneath the diaphragm. Paucity of abdominal bowel gas which is nonspecific. Residual contrast within the urinary bladder and renal collecting systems. IMPRESSION: 1. Small moderate bilateral pleural effusions. Cardiomegaly with vascular congestion and mild interstitial edema. Bibasilar atelectasis or pneumonia. Increasing peripheral infiltrate in the right mid lung. 2. Nonspecific diffuse decreased bowel gas. Electronically Signed   By: KDonavan FoilM.D.   On: 08/10/2018 01:54   Dg Swallowing Func-speech Pathology  Result Date: 08/14/2018 Objective Swallowing Evaluation: Type of Study: MBS-Modified Barium Swallow Study  Patient Details Name: MXANDREA CLAREYMRN: [id number] of Birth: [birth year]Today's Date: 08/14/2018 Time: SLP Start Time (ACUTE ONLY): 123-SLP Stop Time (ACUTE ONLY): 1153 SLP Time Calculation (min) (ACUTE ONLY): 23 min Past Medical History: Past Medical History: Diagnosis Date . Allergic rhinitis   Requires cetirizine, singulair, and fluticasone. . Anxiety  Has been on Xanax since 2009. Uses it for stress, anxiety, and insomnia. No contract yet. . Arthritis  . CHF (congestive heart failure) (HCC)   EF 35% after stroke, presumed ischemic . Chronic pain   Has OA of knees B. No Xrays in echart. Not requiring narcotics. . Colitis 12/2017 . Depression  . Diabetes mellitus   Type 2, non insulin dependent. Was dx'd prior to 2008. [facility]land Kitchen Hyperglycemia  . Hyperlipemia  . Hypertension  . Sickle cell trait ([place])  . Stroke [facility]) 09/05/14  Dominant left MCA infarcts secondary to unknown embolic source  Past Surgical History: Past Surgical History: Procedure Laterality Date . TEE WITHOUT CARDIOVERSION N/A 09/06/2014  Procedure: TRANSESOPHAGEAL ECHOCARDIOGRAM (TEE);  Surgeon: Pixie Casino, MD;  Location[facility] ENDOSCOPY;  Service: Cardiovascular;  Laterality: N/A; HPI:  62 year old woman with a history of LV thrombus and CVA, hypertension, diabetes type 2, presenting to the ER with persistent nausea and vomiting. Per chart CT of the abdomen and pelvis showing thickened bowel loops in the left lower quadrant, infectious versus inflammatory.  Dx includes pna, likely due to aspiration per MD notes. Remote hx of a mild oral dysphagia per [facility] November 2015. BSE 08/12/18 suspected primary esophageal dysphagia, no concerns for oropharyngeal dysphagia and recommended work up for stricture/motility disorder and d/c ST. BSE reordered 9/10.  Subjective: sluggish; delayed responses Assessment / Plan / Recommendation CHL IP CLINICAL IMPRESSIONS 08/14/2018 Clinical Impression Pt exhibited moderate oral and mild pharyneal dysphagia marked by delayed transit and flash penetration. With thicker textures (puree, graham cracker) oral transit was delayed, significant difficulty propelling boluses lingual rocking motion and prolonged mastication with cracker. Initiation of swallow occured primarily at the pyriforms and vallecule with slight incomplete laryngeal closure with barium entering laryngeal vestibule and exiting during the swallow. No pharyngeal residue observed however of significant note, barium passed UES without difficulty but then reappeared ascending cervical esophagus into pyriform sinuses. MBS does not diagnose below the level of the UES however esophageal scan with subsequent swallows did not reveal overt abnormalities. Pt with neurological dysphagia likely from prior CVA however suspect mild behavioral component and suspect GERD. Would benefit from further work up for esophageal impairment SLP Visit Diagnosis Dysphagia, oropharyngeal phase (R13.12) Attention and concentration deficit following -- Frontal lobe and executive function deficit following -- Impact on safety and function Mild aspiration risk   CHL IP TREATMENT RECOMMENDATION 08/14/2018 Treatment Recommendations Therapy as  outlined in treatment plan below   Prognosis 08/14/2018 Prognosis for Safe Diet Advancement Fair Barriers to Reach Goals -- Barriers/Prognosis Comment -- CHL IP DIET RECOMMENDATION 08/14/2018 SLP Diet Recommendations Dysphagia 3 (Mech soft) solids;Thin liquid Liquid Administration via Straw;Cup Medication Administration Whole meds with liquid Compensations Small sips/bites;Slow rate Postural Changes Seated upright at 90 degrees;Remain semi-upright after after feeds/meals (Comment)   CHL IP OTHER RECOMMENDATIONS 08/14/2018 Recommended Consults Consider GI evaluation Oral Care Recommendations Oral care BID Other Recommendations --   CHL IP FOLLOW UP RECOMMENDATIONS 08/14/2018 Follow up Recommendations None   CHL IP FREQUENCY AND DURATION 08/14/2018 Speech Therapy Frequency (ACUTE ONLY) min 1 x/week Treatment Duration 2 weeks      CHL IP ORAL PHASE 08/14/2018 Oral Phase Impaired Oral - Pudding Teaspoon -- Oral - Pudding Cup -- Oral - Honey Teaspoon -- Oral - Honey Cup -- Oral - Nectar Teaspoon -- Oral - Nectar Cup -- Oral - Nectar Straw -- Oral - Thin Teaspoon -- Oral - Thin Cup WFL Oral - Thin Straw WFL Oral - Puree Lingual pumping;Reduced  posterior propulsion;Delayed oral transit Oral - Mech Soft -- Oral - Regular Delayed oral transit;Reduced posterior propulsion Oral - Multi-Consistency -- Oral - Pill -- Oral Phase - Comment --  CHL IP PHARYNGEAL PHASE 08/14/2018 Pharyngeal Phase Impaired Pharyngeal- Pudding Teaspoon -- Pharyngeal -- Pharyngeal- Pudding Cup -- Pharyngeal -- Pharyngeal- Honey Teaspoon -- Pharyngeal -- Pharyngeal- Honey Cup -- Pharyngeal -- Pharyngeal- Nectar Teaspoon -- Pharyngeal -- Pharyngeal- Nectar Cup -- Pharyngeal -- Pharyngeal- Nectar Straw -- Pharyngeal -- Pharyngeal- Thin Teaspoon -- Pharyngeal -- Pharyngeal- Thin Cup Delayed swallow initiation-pyriform sinuses;Penetration/Aspiration during swallow Pharyngeal Material enters airway, remains ABOVE vocal cords then ejected out Pharyngeal- Thin Straw  Penetration/Aspiration during swallow Pharyngeal Material enters airway, remains ABOVE vocal cords then ejected out Pharyngeal- Puree Delayed swallow initiation-vallecula Pharyngeal -- Pharyngeal- Mechanical Soft -- Pharyngeal -- Pharyngeal- Regular WFL Pharyngeal -- Pharyngeal- Multi-consistency -- Pharyngeal -- Pharyngeal- Pill -- Pharyngeal -- Pharyngeal Comment --  CHL IP CERVICAL ESOPHAGEAL PHASE 08/14/2018 Cervical Esophageal Phase WFL Pudding Teaspoon -- Pudding Cup -- Honey Teaspoon -- Honey Cup -- Nectar Teaspoon -- Nectar Cup -- Nectar Straw -- Thin Teaspoon -- Thin Cup -- Thin Straw -- Puree -- Mechanical Soft -- Regular -- Multi-consistency -- Pill -- Cervical Esophageal Comment -- [place] Siren 08/14/2018, 3:25 PM Orbie Pyo Litaker M.Ed Actor Pager [phone] Office [phone]               (Echo, Carotid, EGD, Colonoscopy, ERCP)    Subjective:   Discharge Exam: Vitals:   09/02/18 0425 09/02/18 0855  BP: 113/81   Pulse: (!) 107   Resp: 12   Temp: 98.2 F (36.8 C)   SpO2: 98% 96%   Vitals:   09/01/18 1830 09/01/18 2013 09/02/18 0425 09/02/18 0855  BP: (!) 139/99 (!) 145/89 113/81   Pulse: (!) 128 (!) 127 (!) 107   Resp: 18 16 12    Temp: 98.4 F (36.9 C) 98.1 F (36.7 C) 98.2 F (36.8 C)   TempSrc: Oral Oral Oral   SpO2: 100% 94% 98% 96%  Weight:        General: Pt is alert, awake, not in acute distress Cardiovascular: RRR, S1/S2 +, no rubs, no gallops Respiratory: CTA bilaterally, no wheezing, no rhonchi Abdominal: Soft, NT, ND, bowel sounds + Extremities: no edema, no cyanosis    The results of significant diagnostics from this hospitalization (including imaging, microbiology, ancillary and laboratory) are listed below for reference.     Microbiology: No results found for this or any previous visit (from the past 240 hour(s)).   Labs: BNP (last 3 results) Recent Labs    08/09/18 1643 09/01/18 1600  BNP 1,148.3*  6,144.3*   Basic Metabolic Panel: Recent Labs  Lab 09/01/18 1431 09/02/18 0051  NA 145 140  K 3.0* 4.4  CL 112* 105  CO2 22 24  GLUCOSE 178* 269*  BUN 10 10  CREATININE 0.65 0.93  CALCIUM 8.4* 9.0  MG 1.4* 2.4   Liver Function Tests: Recent Labs  Lab 09/01/18 1431  AST 21  ALT 31  ALKPHOS 40  BILITOT 1.2  PROT 6.3*  ALBUMIN 3.4*   No results for input(s): LIPASE, AMYLASE in the last 168 hours. No results for input(s): AMMONIA in the last 168 hours. CBC: Recent Labs  Lab 09/01/18 1431 09/02/18 0051  WBC 11.3* 10.4  NEUTROABS 9.5*  --   HGB 12.8 13.3  HCT 40.2 42.6  MCV 67.1* 69.8*  PLT 242 256   Cardiac Enzymes: Recent Labs  Lab 09/01/18 1431 09/01/18 1836 09/02/18 0051 09/02/18 0618  TROPONINI 0.07* 0.07* 0.05* 0.04*   BNP: Invalid input(s): POCBNP CBG: Recent Labs  Lab 09/01/18 1835 09/01/18 2018 09/02/18 0740 09/02/18 1103  GLUCAP 172* 207* 253* 252*   D-Dimer No results for input(s): DDIMER in the last 72 hours. Hgb A1c No results for input(s): HGBA1C in the last 72 hours. Lipid Profile No results for input(s): CHOL, HDL, LDLCALC, TRIG, CHOLHDL, LDLDIRECT in the last 72 hours. Thyroid function studies No results for input(s): TSH, T4TOTAL, T3FREE, THYROIDAB in the last 72 hours.  Invalid input(s): FREET3 Anemia work up No results for input(s): VITAMINB12, FOLATE, FERRITIN, TIBC, IRON, RETICCTPCT in the last 72 hours. Urinalysis    Component Value Date/Time   COLORURINE YELLOW 09/01/2018 1431   APPEARANCEUR CLEAR 09/01/2018 1431   LABSPEC 1.021 09/01/2018 1431   PHURINE 5.0 09/01/2018 1431   GLUCOSEU 150 (A) 09/01/2018 1431   HGBUR SMALL (A) 09/01/2018 1431   BILIRUBINUR NEGATIVE 09/01/2018 1431   KETONESUR 80 (A) 09/01/2018 1431   PROTEINUR 100 (A) 09/01/2018 1431   UROBILINOGEN 1.0 09/07/2014 1405   NITRITE NEGATIVE 09/01/2018 1431   LEUKOCYTESUR NEGATIVE 09/01/2018 1431   Sepsis Labs Invalid input(s): PROCALCITONIN,  WBC,   LACTICIDVEN Microbiology No results found for this or any previous visit (from the past 240 hour(s)).   Time coordinating discharge:  33 minutes  SIGNED:   Georgette Shell, MD  [facility] 09/02/2018, 12:40 PM Pager   If 7PM-7AM, please contact night-coverage [email or web address] Password [email or web address]

## 2018-09-07 ENCOUNTER — Other Ambulatory Visit: Payer: Self-pay

## 2018-09-07 ENCOUNTER — Encounter (HOSPITAL_COMMUNITY): Payer: Self-pay | Admitting: Emergency Medicine

## 2018-09-07 ENCOUNTER — Observation Stay (HOSPITAL_COMMUNITY)
Admission: EM | Admit: 2018-09-07 | Discharge: 2018-09-09 | Disposition: A | Discharge status: Home / Self Care | Payer: Medicare HMO | Attending: Internal Medicine | Admitting: Internal Medicine

## 2018-09-07 DIAGNOSIS — Z8249 Family history of ischemic heart disease and other diseases of the circulatory system: Secondary | ICD-10-CM | POA: Diagnosis not present

## 2018-09-07 DIAGNOSIS — Z7982 Long term (current) use of aspirin: Secondary | ICD-10-CM | POA: Insufficient documentation

## 2018-09-07 DIAGNOSIS — I420 Dilated cardiomyopathy: Secondary | ICD-10-CM | POA: Diagnosis not present

## 2018-09-07 DIAGNOSIS — I255 Ischemic cardiomyopathy: Secondary | ICD-10-CM | POA: Insufficient documentation

## 2018-09-07 DIAGNOSIS — R1084 Generalized abdominal pain: Secondary | ICD-10-CM | POA: Diagnosis not present

## 2018-09-07 DIAGNOSIS — I110 Hypertensive heart disease with heart failure: Secondary | ICD-10-CM | POA: Insufficient documentation

## 2018-09-07 DIAGNOSIS — E785 Hyperlipidemia, unspecified: Secondary | ICD-10-CM | POA: Diagnosis not present

## 2018-09-07 DIAGNOSIS — Z7984 Long term (current) use of oral hypoglycemic drugs: Secondary | ICD-10-CM | POA: Insufficient documentation

## 2018-09-07 DIAGNOSIS — F329 Major depressive disorder, single episode, unspecified: Secondary | ICD-10-CM | POA: Insufficient documentation

## 2018-09-07 DIAGNOSIS — R109 Unspecified abdominal pain: Secondary | ICD-10-CM | POA: Diagnosis present

## 2018-09-07 DIAGNOSIS — D573 Sickle-cell trait: Secondary | ICD-10-CM | POA: Diagnosis not present

## 2018-09-07 DIAGNOSIS — R000 Tachycardia, unspecified: Secondary | ICD-10-CM | POA: Insufficient documentation

## 2018-09-07 DIAGNOSIS — Z8673 Personal history of transient ischemic attack (TIA), and cerebral infarction without residual deficits: Secondary | ICD-10-CM | POA: Diagnosis not present

## 2018-09-07 DIAGNOSIS — R112 Nausea with vomiting, unspecified: Secondary | ICD-10-CM | POA: Diagnosis present

## 2018-09-07 DIAGNOSIS — I236 Thrombosis of atrium, auricular appendage, and ventricle as current complications following acute myocardial infarction: Secondary | ICD-10-CM | POA: Diagnosis present

## 2018-09-07 DIAGNOSIS — I480 Paroxysmal atrial fibrillation: Secondary | ICD-10-CM | POA: Insufficient documentation

## 2018-09-07 DIAGNOSIS — G4700 Insomnia, unspecified: Secondary | ICD-10-CM | POA: Insufficient documentation

## 2018-09-07 DIAGNOSIS — K3184 Gastroparesis: Secondary | ICD-10-CM

## 2018-09-07 DIAGNOSIS — F419 Anxiety disorder, unspecified: Secondary | ICD-10-CM | POA: Diagnosis not present

## 2018-09-07 DIAGNOSIS — Z79899 Other long term (current) drug therapy: Secondary | ICD-10-CM | POA: Diagnosis not present

## 2018-09-07 DIAGNOSIS — E119 Type 2 diabetes mellitus without complications: Secondary | ICD-10-CM | POA: Insufficient documentation

## 2018-09-07 DIAGNOSIS — E440 Moderate protein-calorie malnutrition: Secondary | ICD-10-CM | POA: Diagnosis present

## 2018-09-07 DIAGNOSIS — I5042 Chronic combined systolic (congestive) and diastolic (congestive) heart failure: Secondary | ICD-10-CM | POA: Diagnosis present

## 2018-09-07 DIAGNOSIS — Z5181 Encounter for therapeutic drug level monitoring: Secondary | ICD-10-CM

## 2018-09-07 DIAGNOSIS — G8929 Other chronic pain: Principal | ICD-10-CM | POA: Diagnosis present

## 2018-09-07 DIAGNOSIS — M1990 Unspecified osteoarthritis, unspecified site: Secondary | ICD-10-CM | POA: Diagnosis not present

## 2018-09-07 DIAGNOSIS — Z86718 Personal history of other venous thrombosis and embolism: Secondary | ICD-10-CM | POA: Insufficient documentation

## 2018-09-07 DIAGNOSIS — M170 Bilateral primary osteoarthritis of knee: Secondary | ICD-10-CM | POA: Diagnosis not present

## 2018-09-07 DIAGNOSIS — Z7901 Long term (current) use of anticoagulants: Secondary | ICD-10-CM | POA: Diagnosis not present

## 2018-09-07 DIAGNOSIS — R1314 Dysphagia, pharyngoesophageal phase: Secondary | ICD-10-CM | POA: Diagnosis present

## 2018-09-07 LAB — URINALYSIS, ROUTINE W REFLEX MICROSCOPIC
BILIRUBIN URINE: NEGATIVE
Glucose, UA: NEGATIVE mg/dL
HGB URINE DIPSTICK: NEGATIVE
Ketones, ur: NEGATIVE mg/dL
NITRITE: NEGATIVE
PROTEIN: 100 mg/dL — AB
Specific Gravity, Urine: 1.014 (ref 1.005–1.030)
pH: 7 (ref 5.0–8.0)

## 2018-09-07 LAB — CBC
HEMATOCRIT: 41.5 % (ref 36.0–46.0)
HEMOGLOBIN: 13 g/dL (ref 12.0–15.0)
MCH: 21.8 pg — ABNORMAL LOW (ref 26.0–34.0)
MCHC: 31.3 g/dL (ref 30.0–36.0)
MCV: 69.6 fL — AB (ref 80.0–100.0)
Platelets: 205 10*3/uL (ref 150–400)
RBC: 5.96 MIL/uL — ABNORMAL HIGH (ref 3.87–5.11)
RDW: 15.9 % — ABNORMAL HIGH (ref 11.5–15.5)
WBC: 7.5 10*3/uL (ref 4.0–10.5)
nRBC: 0 % (ref 0.0–0.2)

## 2018-09-07 LAB — COMPREHENSIVE METABOLIC PANEL
ALT: 17 U/L (ref 0–44)
AST: 32 U/L (ref 15–41)
Albumin: 2.9 g/dL — ABNORMAL LOW (ref 3.5–5.0)
Alkaline Phosphatase: 29 U/L — ABNORMAL LOW (ref 38–126)
Anion gap: 8 (ref 5–15)
BILIRUBIN TOTAL: 1.3 mg/dL — AB (ref 0.3–1.2)
BUN: 13 mg/dL (ref 8–23)
CHLORIDE: 108 mmol/L (ref 98–111)
CO2: 27 mmol/L (ref 22–32)
CREATININE: 0.59 mg/dL (ref 0.44–1.00)
Calcium: 7.3 mg/dL — ABNORMAL LOW (ref 8.9–10.3)
Glucose, Bld: 167 mg/dL — ABNORMAL HIGH (ref 70–99)
POTASSIUM: 3.9 mmol/L (ref 3.5–5.1)
Sodium: 143 mmol/L (ref 135–145)
TOTAL PROTEIN: 5.4 g/dL — AB (ref 6.5–8.1)

## 2018-09-07 LAB — RAPID URINE DRUG SCREEN, HOSP PERFORMED
Amphetamines: NOT DETECTED
Barbiturates: NOT DETECTED
Benzodiazepines: NOT DETECTED
Cocaine: NOT DETECTED
OPIATES: NOT DETECTED
Tetrahydrocannabinol: NOT DETECTED

## 2018-09-07 LAB — I-STAT CG4 LACTIC ACID, ED: Lactic Acid, Venous: 1.56 mmol/L (ref 0.5–1.9)

## 2018-09-07 LAB — CBG MONITORING, ED: GLUCOSE-CAPILLARY: 152 mg/dL — AB (ref 70–99)

## 2018-09-07 LAB — PROTIME-INR
INR: 1.66
PROTHROMBIN TIME: 19.5 s — AB (ref 11.4–15.2)

## 2018-09-07 LAB — LIPASE, BLOOD: LIPASE: 34 U/L (ref 11–51)

## 2018-09-07 LAB — AMMONIA: Ammonia: 23 umol/L (ref 9–35)

## 2018-09-07 MED ORDER — METOCLOPRAMIDE HCL 5 MG/ML IJ SOLN
10.0000 mg | Freq: Once | INTRAMUSCULAR | Status: AC
  Administered 2018-09-07: 10 mg via INTRAVENOUS
  Filled 2018-09-07: qty 2

## 2018-09-07 MED ORDER — SODIUM CHLORIDE 0.9 % IV BOLUS
1500.0000 mL | Freq: Once | INTRAVENOUS | Status: AC
  Administered 2018-09-07: 1500 mL via INTRAVENOUS

## 2018-09-07 MED ORDER — SODIUM CHLORIDE 0.9 % IV BOLUS
1000.0000 mL | Freq: Once | INTRAVENOUS | Status: AC
  Administered 2018-09-07: 1000 mL via INTRAVENOUS

## 2018-09-07 MED ORDER — ONDANSETRON HCL 4 MG/2ML IJ SOLN
4.0000 mg | Freq: Once | INTRAMUSCULAR | Status: AC
  Administered 2018-09-07: 4 mg via INTRAVENOUS
  Filled 2018-09-07: qty 2

## 2018-09-07 MED ORDER — FENTANYL CITRATE (PF) 100 MCG/2ML IJ SOLN
50.0000 ug | Freq: Once | INTRAMUSCULAR | Status: AC
  Administered 2018-09-07: 50 ug via INTRAVENOUS
  Filled 2018-09-07: qty 2

## 2018-09-07 NOTE — ED Provider Notes (Signed)
[facility] DEPT Provider Note   CSN: [id number] Arrival date & time: 09/07/18  1848     History   Chief Complaint Chief Complaint  Patient presents with  . Abdominal Pain    HPI Kristen Lozano is a 62 y.o. female who presents for evaluation of nausea vomiting and abdominal pain.  Patient has been seen and admitted for the same.  The patient is somewhat lethargic and slow to respond to answers.  She is a poor historian.  Her husband gives the majority of the history.  She denies use of marijuana.  She complains of epigastric abdominal pain and has had intractable vomiting for the past 2 days.  Patient states that she was not discharged with her Zofran.  Review of her discharge notes that that she was feeling much better and tolerating a normal diet at discharge.  She denies hematemesis.  She has not had outpatient GI work-up.  She denies diarrhea or constipation.  The patient is extremely thin and her husband states "she is always been petite."  HPI  Past Medical History:  Diagnosis Date  . Allergic rhinitis    Requires cetirizine, singulair, and fluticasone.  . Anxiety    Has been on Xanax since 2009. Uses it for stress, anxiety, and insomnia. No contract yet.  . Arthritis   . CHF (congestive heart failure) (HCC)    EF 35% after stroke, presumed ischemic  . Chronic pain    Has OA of knees B. No Xrays in echart. Not requiring narcotics.  . Colitis 12/2017  . Depression   . Diabetes mellitus    Type 2, non insulin dependent. Was dx'd prior to 2008.  Marland Kitchen Hyperglycemia   . Hyperlipemia   . Hypertension   . Sickle cell trait ([place])   . Stroke [facility]) 09/05/14   Dominant left MCA infarcts secondary to unknown embolic source     Patient Active Problem List   Diagnosis Date Noted  . Dyspnea   . Hypomagnesemia   . Sinus tachycardia   . Malnutrition of moderate degree 08/13/2018  . Enteritis   . Pleural effusion   . Nausea 08/11/2018  . Acute  systolic CHF (congestive heart failure), NYHA class 3 ([place])   . Dilated cardiomyopathy ([place])   . Nausea & vomiting 08/09/2018  . Type 2 diabetes mellitus with vascular disease ([place]) 08/09/2018  . Abdominal pain 08/09/2018  . Infectious colitis 01/11/2018  . Hypokalemia 01/11/2018  . Uncontrolled type 2 diabetes mellitus with hyperglycemia ([place])   . Chronic combined systolic and diastolic CHF (congestive heart failure) ([place]) 12/16/2016  . Monitoring for long-term anticoagulant use 11/14/2016  . DKA (diabetic ketoacidoses) ([place]) 11/01/2016  . Diabetes mellitus 11/01/2016  . Elevated troponin   . Cardiomyopathy, ischemic   . 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 Cognitive deficit due to recent stroke 10/18/2014  . History of completed stroke 10/18/2014  . Abnormal stress test 10/17/2014  . Dysphagia, pharyngoesophageal phase 09/23/2014  . Abnormal CT scan 09/23/2014  . Dilated bile duct 09/23/2014  . Urinary retention 09/07/2014  . 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 Stroke ([place]) 09/05/2014  . Cerebral infarction ([place]) 09/05/2014  . Non compliance w medication regimen 08/10/2012  . Fatigue 02/18/2012  . Essential hypertension 03/22/2011  . Healthcare maintenance 03/22/2011  . Chronic pain   . ALLERGIC RHINITIS, SEASONAL 03/14/2008  . SICKLE CELL TRAIT 10/31/2006  . Anxiety state 10/31/2006  . DEPRESSION 10/31/2006  Past Surgical History:  Procedure Laterality Date  . TEE WITHOUT CARDIOVERSION N/A 09/06/2014   Procedure: TRANSESOPHAGEAL ECHOCARDIOGRAM (TEE);  Surgeon: Pixie Casino, MD;  Location: [facility] ENDOSCOPY;  Service: Cardiovascular;  Laterality: N/A;     OB History   None      Home Medications    Prior to Admission medications   Medication Sig Start Date End Date Taking? Authorizing Provider  ASPIRIN LOW DOSE 81 MG EC tablet Take 1 tablet (81 mg total) by mouth daily. Patient taking differently: Take 81 mg by mouth daily.   01/22/18  Yes Hilty, Nadean Corwin, MD  atorvastatin (LIPITOR) 20 MG tablet Take 1 tablet (20 mg total) by mouth daily at 6 PM. Please schedule appointment for refills. 01/02/18  Yes Hilty, Nadean Corwin, MD  b complex vitamins tablet Take 1 tablet by mouth daily.   Yes [provider]  Dulaglutide (TRULICITY) 9.51 OA/4.1YS SOPN Inject 0.75 mg into the skin every Saturday.   Yes [provider]  feeding supplement, ENSURE ENLIVE, (ENSURE ENLIVE) LIQD Take 237 mLs by mouth 3 (three) times daily between meals. 08/17/18  Yes Oretha Milch D, MD  furosemide (LASIX) 20 MG tablet Take 1 tablet (20 mg total) by mouth daily. 09/02/18 09/02/19 Yes Georgette Shell, MD  metFORMIN (GLUCOPHAGE-XR) 500 MG 24 hr tablet Take 500 mg by mouth 2 (two) times daily. 08/06/18  Yes [provider]  ondansetron (ZOFRAN) 4 MG tablet Take 1 tablet (4 mg total) by mouth every 6 (six) hours as needed for nausea. 09/02/18  Yes Georgette Shell, MD  pantoprazole (PROTONIX) 40 MG tablet Take 1 tablet (40 mg total) by mouth daily. 08/17/18  Yes Oretha Milch D, MD  potassium chloride (K-DUR) 10 MEQ tablet Take 1 tablet (10 mEq total) by mouth daily. 09/02/18  Yes Georgette Shell, MD  warfarin (COUMADIN) 5 MG tablet Tale 1.5 Tablets (7.5mg )daily OR as directed by coumadin clinic Patient taking differently: Take 5-7.5 mg by mouth See admin instructions. Take 1 1/2 tablets (7.5 mg) on Monday and Friday evening, take 1 tablet (5 mg) on Sunday, Tuesday, Wednesday, Thursday, Saturday evening or as directed by coumadin clinic 05/29/18  Yes Hilty, Nadean Corwin, MD    Family History Family History  Problem Relation Age of Onset  . Diabetes Mother   . Hypertension Mother   . Heart disease Father   . Heart attack Father   . Hypertension Father   . Diabetes Father   . Ovarian cancer Sister   . Liver cancer Sister   . Sickle cell anemia Daughter   . Schizophrenia Daughter   . Hypertension Sister   . Hypertension  Brother   . Hypertension Daughter   . Kidney disease Sister        x2  . Kidney disease Brother   . Stroke Neg Hx   . Esophageal cancer Neg Hx   . Colon cancer Neg Hx   . Colon polyps Neg Hx     Social History Social History   Tobacco Use  . Smoking status: Never Smoker  . Smokeless tobacco: Never Used  Substance Use Topics  . Alcohol use: No    Alcohol/week: 0.0 standard drinks  . Drug use: No     Allergies   Patient has no known allergies.   Review of Systems Review of Systems  Ten systems reviewed and are negative for acute change, except as noted in the HPI.   Physical Exam Updated Vital Signs BP Marland Kitchen)  144/85   Pulse (!) 136   Temp 98.6 F (37 C) (Oral)   Resp 20   Ht 5\' 3"  (1.6 m)   Wt 49.9 kg   SpO2 100%   BMI 19.49 kg/m   Physical Exam  Constitutional: She is oriented to person, place, and time. She appears well-developed and well-nourished. She appears lethargic. No distress.  HENT:  Head: Normocephalic and atraumatic.  Eyes: Conjunctivae are normal. No scleral icterus.  Neck: Normal range of motion.  Cardiovascular: Normal rate, regular rhythm and normal heart sounds. Exam reveals no gallop and no friction rub.  No murmur heard. Pulmonary/Chest: Effort normal and breath sounds normal. No respiratory distress.  Abdominal: Soft. Bowel sounds are normal. She exhibits no distension and no mass. There is generalized tenderness. There is no guarding and no CVA tenderness.  Neurological: She is oriented to person, place, and time. She appears lethargic. GCS eye subscore is 4. GCS verbal subscore is 5. GCS motor subscore is 6.  Skin: Skin is warm and dry. She is not diaphoretic.  Psychiatric: Her behavior is normal.  Nursing note and vitals reviewed.    ED Treatments / Results  Labs (all labs ordered are listed, but only abnormal results are displayed) Labs Reviewed  LIPASE, BLOOD  COMPREHENSIVE METABOLIC PANEL  CBC  URINALYSIS, ROUTINE W REFLEX  MICROSCOPIC  RAPID URINE DRUG SCREEN, HOSP PERFORMED  PROTIME-INR  I-STAT CG4 LACTIC ACID, ED    EKG EKG Interpretation  Date/Time:  Tuesday September 08 2018 00:13:39 EDT Ventricular Rate:  145 PR Interval:    QRS Duration: 80 QT Interval:  293 QTC Calculation: 455 R Axis:   100 Text Interpretation:  Sinus tachycardia Ventricular premature complex Probable left atrial enlargement Right axis deviation Consider left ventricular hypertrophy Anteroseptal infarct, old Nonspecific T abnormalities, lateral leads Baseline wander in lead(s) V3 Partial missing lead(s): V3 No significant change since last tracing Confirmed by Addison Lank [phone]) on 09/09/2018 9:29:34 PM   Radiology No results found.  Procedures Procedures (including critical care time)  Medications Ordered in ED Medications  sodium chloride 0.9 % bolus 1,500 mL (1,500 mLs Intravenous New Bag/Given 09/07/18 2049)  ondansetron (ZOFRAN) injection 4 mg (4 mg Intravenous Given 09/07/18 2049)     Initial Impression / Assessment and Plan / ED Course  I have reviewed the triage vital signs and the nursing notes.  Pertinent labs & imaging results that were available during my care of the patient were reviewed by me and considered in my medical decision making (see chart for details).  Clinical Course as of Sep 07 2357  Mon Sep 07, 2018  2316 On my reevaluation the patient's heart rate is still in the 140s.  She still actively vomiting.  Gave her Zofran however she still has severe nausea.  Her sister-in-law called and to speak with me and told me that she was previously diagnosed with gastroparesis at [facility] gastroenterology.  She thinks this might be whats causing her to have intractable vomiting.   [AH]    Clinical Course User Index [AH] Margarita Mail, PA-C    Patient with intractable nausea and vomiting.  This is likely gastroparesis.  EKG is unchanged from previous.  She does not appear to have a urinary tract  infection.  No evidence of DKA.  Leukocytosis.  Evidence of sepsis.  Patient will be admitted by the hospitalist service.  Final Clinical Impressions(s) / ED Diagnoses   Final diagnoses:  Intractable vomiting with nausea  Gastroparesis  ED Discharge Orders    None       Margarita Mail, PA-C 09/09/18 2159    Margette Fast, MD 09/10/18 [phone]

## 2018-09-07 NOTE — ED Notes (Signed)
Bed: WA08 Expected date:  Expected time:  Means of arrival:  Comments: EMS abdominal pain

## 2018-09-07 NOTE — ED Triage Notes (Signed)
Patient arrived by EMS from home. Pt c/o abd pain and malaise. BP 156/100, HR HR 133, SpO2 100, CBG 187. Hx of diabetes, stroke, and a-fib. Pain 08/10 stated by patient.

## 2018-09-08 ENCOUNTER — Ambulatory Visit: Payer: Medicare HMO | Admitting: Internal Medicine

## 2018-09-08 DIAGNOSIS — G8929 Other chronic pain: Secondary | ICD-10-CM | POA: Diagnosis not present

## 2018-09-08 DIAGNOSIS — R000 Tachycardia, unspecified: Secondary | ICD-10-CM | POA: Diagnosis not present

## 2018-09-08 DIAGNOSIS — R112 Nausea with vomiting, unspecified: Secondary | ICD-10-CM | POA: Diagnosis not present

## 2018-09-08 DIAGNOSIS — R1084 Generalized abdominal pain: Secondary | ICD-10-CM | POA: Diagnosis not present

## 2018-09-08 DIAGNOSIS — E119 Type 2 diabetes mellitus without complications: Secondary | ICD-10-CM | POA: Diagnosis not present

## 2018-09-08 LAB — BASIC METABOLIC PANEL
ANION GAP: 10 (ref 5–15)
BUN: 13 mg/dL (ref 8–23)
CALCIUM: 8.4 mg/dL — AB (ref 8.9–10.3)
CO2: 25 mmol/L (ref 22–32)
CREATININE: 0.59 mg/dL (ref 0.44–1.00)
Chloride: 108 mmol/L (ref 98–111)
GFR calc non Af Amer: >60 mL/min (ref >60)
Glucose, Bld: 187 mg/dL — ABNORMAL HIGH (ref 70–99)
Potassium: 2.9 mmol/L — ABNORMAL LOW (ref 3.5–5.1)
SODIUM: 143 mmol/L (ref 135–145)

## 2018-09-08 LAB — CBC
HCT: 42.3 % (ref 36.0–46.0)
Hemoglobin: 13.3 g/dL (ref 12.0–15.0)
MCH: 21.7 pg — ABNORMAL LOW (ref 26.0–34.0)
MCHC: 31.4 g/dL (ref 30.0–36.0)
MCV: 69 fL — ABNORMAL LOW (ref 80.0–100.0)
NRBC: 0 % (ref 0.0–0.2)
PLATELETS: 211 10*3/uL (ref 150–400)
RBC: 6.13 MIL/uL — ABNORMAL HIGH (ref 3.87–5.11)
RDW: 15.6 % — AB (ref 11.5–15.5)
WBC: 6.7 10*3/uL (ref 4.0–10.5)

## 2018-09-08 LAB — MAGNESIUM: Magnesium: 1.5 mg/dL — ABNORMAL LOW (ref 1.7–2.4)

## 2018-09-08 LAB — PROTIME-INR
INR: 1.5
PROTHROMBIN TIME: 18 s — AB (ref 11.4–15.2)

## 2018-09-08 LAB — HEMOGLOBIN A1C
HEMOGLOBIN A1C: 7.4 % — AB (ref 4.8–5.6)
MEAN PLASMA GLUCOSE: 165.68 mg/dL

## 2018-09-08 LAB — GLUCOSE, CAPILLARY
GLUCOSE-CAPILLARY: 124 mg/dL — AB (ref 70–99)
Glucose-Capillary: 237 mg/dL — ABNORMAL HIGH (ref 70–99)
Glucose-Capillary: 229 mg/dL — ABNORMAL HIGH (ref 70–99)
Glucose-Capillary: 168 mg/dL — ABNORMAL HIGH (ref 70–99)

## 2018-09-08 LAB — PHOSPHORUS: Phosphorus: 2.8 mg/dL (ref 2.5–4.6)

## 2018-09-08 LAB — BRAIN NATRIURETIC PEPTIDE: B Natriuretic Peptide: 493 pg/mL — ABNORMAL HIGH (ref 0.0–100.0)

## 2018-09-08 MED ORDER — WARFARIN SODIUM 5 MG PO TABS: 7.5000 mg | ORAL_TABLET | ORAL | Status: DC

## 2018-09-08 MED ORDER — SENNA 8.6 MG PO TABS
1.0000 | ORAL_TABLET | Freq: Two times a day (BID) | ORAL | Status: DC
  Administered 2018-09-08 – 2018-09-09 (×2): 8.6 mg via ORAL
  Filled 2018-09-08 (×3): qty 1

## 2018-09-08 MED ORDER — ACETAMINOPHEN 325 MG PO TABS
650.0000 mg | ORAL_TABLET | Freq: Four times a day (QID) | ORAL | Status: DC | PRN
  Filled 2018-09-08: qty 2

## 2018-09-08 MED ORDER — DOCUSATE SODIUM 100 MG PO CAPS
100.0000 mg | ORAL_CAPSULE | Freq: Two times a day (BID) | ORAL | Status: DC
  Administered 2018-09-08 – 2018-09-09 (×2): 100 mg via ORAL
  Filled 2018-09-08 (×3): qty 1

## 2018-09-08 MED ORDER — ONDANSETRON HCL 4 MG/2ML IJ SOLN: 4.0000 mg | Freq: Four times a day (QID) | INTRAMUSCULAR | Status: DC | PRN

## 2018-09-08 MED ORDER — MAGNESIUM SULFATE 2 GM/50ML IV SOLN
2.0000 g | Freq: Once | INTRAVENOUS | Status: AC
  Administered 2018-09-08: 2 g via INTRAVENOUS
  Filled 2018-09-08: qty 50

## 2018-09-08 MED ORDER — INSULIN ASPART 100 UNIT/ML ~~LOC~~ SOLN
0.0000 [IU] | Freq: Three times a day (TID) | SUBCUTANEOUS | Status: DC
  Administered 2018-09-08: 1 [IU] via SUBCUTANEOUS
  Administered 2018-09-08 (×2): 3 [IU] via SUBCUTANEOUS
  Administered 2018-09-09: 1 [IU] via SUBCUTANEOUS
  Administered 2018-09-09: 2 [IU] via SUBCUTANEOUS
  Administered 2018-09-09: 3 [IU] via SUBCUTANEOUS

## 2018-09-08 MED ORDER — INSULIN ASPART 100 UNIT/ML ~~LOC~~ SOLN: 0.0000 [IU] | Freq: Every day | SUBCUTANEOUS | Status: DC

## 2018-09-08 MED ORDER — POTASSIUM CHLORIDE 10 MEQ/100ML IV SOLN
10.0000 meq | INTRAVENOUS | Status: AC
  Administered 2018-09-08 (×4): 10 meq via INTRAVENOUS
  Filled 2018-09-08 (×4): qty 100

## 2018-09-08 MED ORDER — FENTANYL CITRATE (PF) 100 MCG/2ML IJ SOLN
12.5000 ug | INTRAMUSCULAR | Status: DC | PRN
  Administered 2018-09-08: 12.5 ug via INTRAVENOUS
  Filled 2018-09-08: qty 2

## 2018-09-08 MED ORDER — ASPIRIN EC 81 MG PO TBEC
81.0000 mg | DELAYED_RELEASE_TABLET | Freq: Every day | ORAL | Status: DC
  Administered 2018-09-08 – 2018-09-09 (×2): 81 mg via ORAL
  Filled 2018-09-08 (×2): qty 1

## 2018-09-08 MED ORDER — METOPROLOL TARTRATE 50 MG PO TABS: 75.0000 mg | ORAL_TABLET | Freq: Two times a day (BID) | ORAL | Status: DC

## 2018-09-08 MED ORDER — ENSURE ENLIVE PO LIQD
237.0000 mL | Freq: Three times a day (TID) | ORAL | Status: DC
  Administered 2018-09-08 – 2018-09-09 (×4): 237 mL via ORAL

## 2018-09-08 MED ORDER — ACETAMINOPHEN 650 MG RE SUPP: 650.0000 mg | Freq: Four times a day (QID) | RECTAL | Status: DC | PRN

## 2018-09-08 MED ORDER — ATORVASTATIN CALCIUM 10 MG PO TABS
20.0000 mg | ORAL_TABLET | Freq: Every day | ORAL | Status: DC
  Administered 2018-09-08 – 2018-09-09 (×2): 20 mg via ORAL
  Filled 2018-09-08 (×2): qty 2

## 2018-09-08 MED ORDER — ONDANSETRON HCL 4 MG PO TABS: 4.0000 mg | ORAL_TABLET | Freq: Four times a day (QID) | ORAL | Status: DC | PRN

## 2018-09-08 MED ORDER — WARFARIN - PHYSICIAN DOSING INPATIENT: Freq: Every day | Status: DC

## 2018-09-08 MED ORDER — PANTOPRAZOLE SODIUM 40 MG PO TBEC
40.0000 mg | DELAYED_RELEASE_TABLET | Freq: Every day | ORAL | Status: DC
  Administered 2018-09-08 – 2018-09-09 (×2): 40 mg via ORAL
  Filled 2018-09-08 (×2): qty 1

## 2018-09-08 MED ORDER — METOCLOPRAMIDE HCL 5 MG/ML IJ SOLN
10.0000 mg | Freq: Four times a day (QID) | INTRAMUSCULAR | Status: AC
  Administered 2018-09-08 (×3): 10 mg via INTRAVENOUS
  Filled 2018-09-08 (×3): qty 2

## 2018-09-08 MED ORDER — SODIUM CHLORIDE 0.9 % IV SOLN
INTRAVENOUS | Status: DC | PRN
  Administered 2018-09-08: 250 mL via INTRAVENOUS

## 2018-09-08 MED ORDER — POTASSIUM CHLORIDE CRYS ER 20 MEQ PO TBCR
40.0000 meq | EXTENDED_RELEASE_TABLET | Freq: Two times a day (BID) | ORAL | Status: AC
  Administered 2018-09-08: 40 meq via ORAL
  Filled 2018-09-08 (×2): qty 2

## 2018-09-08 MED ORDER — SODIUM CHLORIDE 0.9 % IV BOLUS
250.0000 mL | Freq: Once | INTRAVENOUS | Status: AC
  Administered 2018-09-08: 250 mL via INTRAVENOUS

## 2018-09-08 MED ORDER — WARFARIN SODIUM 5 MG PO TABS
5.0000 mg | ORAL_TABLET | ORAL | Status: DC
  Administered 2018-09-08: 5 mg via ORAL
  Filled 2018-09-08: qty 1

## 2018-09-08 MED ORDER — POLYETHYLENE GLYCOL 3350 17 G PO PACK
17.0000 g | PACK | Freq: Every day | ORAL | Status: DC
  Administered 2018-09-08 – 2018-09-09 (×2): 17 g via ORAL
  Filled 2018-09-08 (×2): qty 1

## 2018-09-08 MED ORDER — METOPROLOL TARTRATE 5 MG/5ML IV SOLN
5.0000 mg | INTRAVENOUS | Status: AC
  Administered 2018-09-08: 5 mg via INTRAVENOUS
  Filled 2018-09-08: qty 5

## 2018-09-08 MED ORDER — SODIUM CHLORIDE 0.9 % IV SOLN
INTRAVENOUS | Status: AC
  Administered 2018-09-09: 10:00:00 via INTRAVENOUS

## 2018-09-08 NOTE — Progress Notes (Signed)
Called ED @ [phone] to receive report on patient. Was placed on hold for 7 mins. EDRN to call back with report to [phone] when ready.

## 2018-09-08 NOTE — Progress Notes (Signed)
Patient continues to be remain sinus tachy in 140s. MD notified. Received orders.

## 2018-09-08 NOTE — ED Notes (Signed)
Hospitalist stated to hold off calling report until EKG was shot. Pt may go to step down.

## 2018-09-08 NOTE — Progress Notes (Signed)
5 beats of vtach. MD notified. No new orders received.

## 2018-09-08 NOTE — ED Notes (Signed)
ED TO INPATIENT HANDOFF REPORT  Name/Age/Gender Kristen Lozano 62 y.o. female  Code Status Code Status History    Date Active Date Inactive Code Status Order ID Comments User Context   09/01/2018 1746 09/02/2018 1749 Full Code [id number]  Cristy Folks, MD ED   08/09/2018 2140 08/17/2018 2106 Full Code [id number]  Rise Patience, MD ED   01/11/2018 0125 01/15/2018 1528 Full Code [id number]  Vianne Bulls, MD ED   10/29/2016 0010 11/01/2016 1654 Full Code [id number]  Edwin Dada, MD Inpatient   09/05/2014 0221 09/10/2014 1635 Full Code [id number]  Berle Mull, MD Inpatient      Home/SNF/Other Home  Chief Complaint abdominal pain   Level of Care/Admitting Diagnosis ED Disposition    ED Disposition Condition [facility]: [facility] [[id number]]  Level of Care: Telemetry [5]  Admit to tele based on following criteria: Complex arrhythmia (Bradycardia/Tachycardia)  Diagnosis: Nausea with vomiting [787.01.ICD-9-CM]  Admitting Physician: Bennie Pierini [[id number]]  Attending Physician: Jonnie Finner, [place] [[id number]]  Estimated length of stay: past midnight tomorrow  Certification:: I certify this patient will need inpatient services for at least 2 midnights  PT Class (Do Not Modify): Inpatient [101]  PT Acc Code (Do Not Modify): Private [1]       Medical History Past Medical History:  Diagnosis Date  . Allergic rhinitis    Requires cetirizine, singulair, and fluticasone.  . Anxiety    Has been on Xanax since 2009. Uses it for stress, anxiety, and insomnia. No contract yet.  . Arthritis   . CHF (congestive heart failure) (HCC)    EF 35% after stroke, presumed ischemic  . Chronic pain    Has OA of knees B. No Xrays in echart. Not requiring narcotics.  . Colitis 12/2017  . Depression   . Diabetes mellitus    Type 2, non insulin dependent. Was dx'd prior to 2008.  Marland Kitchen Hyperglycemia   . Hyperlipemia   . Hypertension   . Sickle  cell trait ([place])   . 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     Allergies No Known Allergies  IV Location/Drains/Wounds Patient Lines/Drains/Airways Status   Active Line/Drains/Airways    Name:   Placement date:   Placement time:   Site:   Days:   Peripheral IV 09/07/18 Left Antecubital   09/07/18    2006    Antecubital   1          Labs/Imaging Results for orders placed or performed during the hospital encounter of 09/07/18 (from the past 48 hour(s))  Ammonia     Status: None   Collection Time: 09/07/18  8:50 PM  Result Value Ref Range   Ammonia 23 9 - 35 umol/L    Comment: Performed at [facility], [place] [address], [place], [place] [address]  I-Stat CG4 Lactic Acid, ED     Status: None   Collection Time: 09/07/18  8:59 PM  Result Value Ref Range   Lactic Acid, Venous 1.56 0.5 - 1.9 mmol/L  Lipase, blood     Status: None   Collection Time: 09/07/18  9:00 PM  Result Value Ref Range   Lipase 34 11 - 51 U/L    Comment: Performed at [facility], [place] [address] [place], [place] [address]  Comprehensive metabolic panel     Status: Abnormal   Collection Time: 09/07/18  9:00 PM  Result Value Ref Range   Sodium 143 135 - 145 mmol/L   Potassium 3.9 3.5 - 5.1 mmol/L   Chloride 108 98 - 111 mmol/L   CO2 27 22 - 32 mmol/L   Glucose, Bld 167 (H) 70 - 99 mg/dL   BUN 13 8 - 23 mg/dL   Creatinine, Ser 0.59 0.44 - 1.00 mg/dL   Calcium 7.3 (L) 8.9 - 10.3 mg/dL   Total Protein 5.4 (L) 6.5 - 8.1 g/dL   Albumin 2.9 (L) 3.5 - 5.0 g/dL   AST 32 15 - 41 U/L   ALT 17 0 - 44 U/L   Alkaline Phosphatase 29 (L) 38 - 126 U/L   Total Bilirubin 1.3 (H) 0.3 - 1.2 mg/dL   GFR calc non Af Amer >60 >60 mL/min   GFR calc Af Amer >60 >60 mL/min    Comment: (NOTE) The eGFR has been calculated using the CKD EPI equation. This calculation has not been validated in all clinical situations. eGFR's persistently <60  mL/min signify possible Chronic Kidney Disease.    Anion gap 8 5 - 15    Comment: Performed at [facility], [place] [address] [place], [place] [address]  CBC     Status: Abnormal   Collection Time: 09/07/18  9:00 PM  Result Value Ref Range   WBC 7.5 4.0 - 10.5 K/uL   RBC 5.96 (H) 3.87 - 5.11 MIL/uL   Hemoglobin 13.0 12.0 - 15.0 g/dL   HCT 41.5 36.0 - 46.0 %   MCV 69.6 (L) 80.0 - 100.0 fL   MCH 21.8 (L) 26.0 - 34.0 pg   MCHC 31.3 30.0 - 36.0 g/dL   RDW 15.9 (H) 11.5 - 15.5 %   Platelets 205 150 - 400 K/uL   nRBC 0.0 0.0 - 0.2 %    Comment: Performed at [facility], [place] [address] [place], [place] [address]  Protime-INR     Status: Abnormal   Collection Time: 09/07/18  9:00 PM  Result Value Ref Range   Prothrombin Time 19.5 (H) 11.4 - 15.2 seconds   INR 1.66     Comment: Performed at [facility], [place] [address], [place], [place] [address]  CBG monitoring, ED     Status: Abnormal   Collection Time: 09/07/18 10:02 PM  Result Value Ref Range   Glucose-Capillary 152 (H) 70 - 99 mg/dL   Comment 1 Notify RN    Comment 2 Document in Chart   Urinalysis, Routine w reflex microscopic     Status: Abnormal   Collection Time: 09/07/18 10:11 PM  Result Value Ref Range   Color, Urine YELLOW YELLOW   APPearance CLEAR CLEAR   Specific Gravity, Urine 1.014 1.005 - 1.030   pH 7.0 5.0 - 8.0   Glucose, UA NEGATIVE NEGATIVE mg/dL   Hgb urine dipstick NEGATIVE NEGATIVE   Bilirubin Urine NEGATIVE NEGATIVE   Ketones, ur NEGATIVE NEGATIVE mg/dL   Protein, ur 100 (A) NEGATIVE mg/dL   Nitrite NEGATIVE NEGATIVE   Leukocytes, UA TRACE (A) NEGATIVE   RBC / HPF 0-5 0 - 5 RBC/hpf   WBC, UA 6-10 0 - 5 WBC/hpf   Bacteria, UA RARE (A) NONE SEEN   Squamous Epithelial / LPF 6-10 0 - 5   Mucus PRESENT     Comment: Performed at [facility], [place] [address] [place], [place] [address]  Rapid urine drug screen (hospital performed)      Status: None  Collection Time: 09/07/18 10:11 PM  Result Value Ref Range   Opiates NONE DETECTED NONE DETECTED   Cocaine NONE DETECTED NONE DETECTED   Benzodiazepines NONE DETECTED NONE DETECTED   Amphetamines NONE DETECTED NONE DETECTED   Tetrahydrocannabinol NONE DETECTED NONE DETECTED   Barbiturates NONE DETECTED NONE DETECTED    Comment: (NOTE) DRUG SCREEN FOR MEDICAL PURPOSES ONLY.  IF CONFIRMATION IS NEEDED FOR ANY PURPOSE, NOTIFY LAB WITHIN 5 DAYS. LOWEST DETECTABLE LIMITS FOR URINE DRUG SCREEN Drug Class                     Cutoff (ng/mL) Amphetamine and metabolites    1000 Barbiturate and metabolites    200 Benzodiazepine                 213 Tricyclics and metabolites     300 Opiates and metabolites        300 Cocaine and metabolites        300 THC                            50 Performed at [facility], [place] [address], [place], [place] [address]    No results found. EKG Interpretation  Date/Time:  Monday September 07 2018 21:07:57 EDT Ventricular Rate:  142 PR Interval:    QRS Duration: 81 QT Interval:  275 QTC Calculation: 423 R Axis:   102 Text Interpretation:  Sinus tachycardia LAE, consider biatrial enlargement Right axis deviation Consider left ventricular hypertrophy Anterior Q waves, possibly due to LVH Nonspecific T abnormalities, inferior leads no sig change from previous Confirmed by Charlesetta Shanks [phone]) on 09/07/2018 9:35:42 PM   Pending Labs Unresulted Labs (From admission, onward)    Start     Ordered   Signed and Held  Magnesium  Add-on,   R     Signed and Held   Signed and Held  Phosphorus  Add-on,   R     Signed and Held   Signed and Held  Vitamin B1  Add-on,   R     Signed and Held   Signed and Held  Protime-INR  Daily,   R     Signed and Occupational psychologist and Occupational hygienist morning,   R     Signed and Held   Visual merchandiser and Held  CBC  Tomorrow morning,   R     Signed and Held   Signed and Held   Hemoglobin A1c  Add-on,   R     Signed and Held          Vitals/Pain Today's Vitals   09/08/18 0000 09/08/18 0100 09/08/18 0107 09/08/18 0116  BP: (!) 156/106 136/89    Pulse: (!) 140 (!) 129  (!) 126  Resp: '19 16  17  '$ Temp:      TempSrc:      SpO2: 99% 98%  97%  Weight:      Height:      PainSc:   Asleep     Isolation Precautions No active isolations  Medications Medications  sodium chloride 0.9 % bolus 1,500 mL (0 mLs Intravenous Stopped 09/07/18 2150)  ondansetron (ZOFRAN) injection 4 mg (4 mg Intravenous Given 09/07/18 2049)  metoCLOPramide (REGLAN) injection 10 mg (10 mg Intravenous Given 09/07/18 2325)  fentaNYL (SUBLIMAZE) injection 50 mcg (50 mcg Intravenous Given 09/07/18 2325)  sodium chloride 0.9 % bolus 1,000  mL (0 mLs Intravenous Stopped 09/08/18 0110)  metoprolol tartrate (LOPRESSOR) injection 5 mg (5 mg Intravenous Given 09/08/18 0110)    Mobility walks

## 2018-09-08 NOTE — Progress Notes (Signed)
Patient was admitted early this AM after midnight and H and P has been reviewed and I am in current agreement with the Assessment and plan done by Dr. Otelia Limes. Additional changes to the plan of care have been made accordingly.  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  Patient reports diffuse abdominal pain and was worse with eating and taking pills.  She also had an episode of nausea vomiting multiple episodes of nonbloody, nonbilious emesis.  She is had multiple CT scans of the abdomen showed no acute findings.  She has never had an EGD or colonoscopy per report and she was brought to the emergency room because of these complaints and admitted.  She received 2 9 L of normal saline, Zofran, and Reglan as well as fentanyl with improvement of symptoms of her abdominal pain however abdominal pain persists.  Has spoken with p.o. gastroenterology Dr. Arta Silence who will see the patient in consultation for further evaluation recommendations.  We will continue monitor current patient's current clinical response to intervention and repeat blood work in the a.m.

## 2018-09-08 NOTE — H&P (Signed)
History and Physical    Kristen Lozano QMG:[id number] DOB: [birth year] DOA: 09/07/2018  PCP: Audley Hose, MD Patient coming from: Home  I have personally briefly reviewed patient's old medical records in [place]  Chief Complaint: Belly pain  HPI: Kristen Lozano is a 62 y.o. female with medical history significant for HFrEF (last EF 20%, 07/2018), paroxysmal atrial fibrillation, DM2 with apparent history of diabetic gastroparesis, HTN, CVA and chronic abdominal pain with multiple hospitalizations for similar complaints, including hospital stay from 10/8 - 10/9 for the same complaints as today which resolved spontaneously. Today, she reports diffuse abdominal pain. It is difficult to assess the timing of the symptoms as she is a poor historian and defers to her ex-husband, who is present at bedside. Apparently, her abdominal pain is worsened with eating and taking pills. She has also had episodes of nausea and vomiting, including multiple episodes of non-bloody, non-bilious emesis today. Nausea and vomiting are chronic issues. She has had multiple recent CT scans of her abdomen showing no acute findings. She denies previously undergoing EGD or colonoscopy. No fever, chills, cough, shortness of breath, chest pain, urinary changes or rash. Pain has resolved in the ED.  ED Course: In the ED, pt afebrile, normotensive and tachycardic to the 130s-140s. Labs show no leukocytosis, Hgb 13.0. Labs show normal renal function and electrolytes, normal LFTs and negative lipase. U/A shows trace leuks, negative nitrites and rare bacteria. EKG showed sinus tachycardia with rate in the 140s, right axis deviation and LVH. Pt received 2.5L NS, Zofran, Reglan and fentanyl with improvement in her symptoms of abdominal pain.  Review of Systems: As per HPI otherwise 10 point review of systems negative.   Past Medical History:  Diagnosis Date  . Allergic rhinitis    Requires cetirizine, singulair, and  fluticasone.  . Anxiety    Has been on Xanax since 2009. Uses it for stress, anxiety, and insomnia. No contract yet.  . Arthritis   . CHF (congestive heart failure) (HCC)    EF 35% after stroke, presumed ischemic  . Chronic pain    Has OA of knees B. No Xrays in echart. Not requiring narcotics.  . Colitis 12/2017  . Depression   . Diabetes mellitus    Type 2, non insulin dependent. Was dx'd prior to 2008.  Marland Kitchen Hyperglycemia   . Hyperlipemia   . Hypertension   . Sickle cell trait ([place])   . 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 TEE WITHOUT CARDIOVERSION N/A 09/06/2014   Procedure: TRANSESOPHAGEAL ECHOCARDIOGRAM (TEE);  Surgeon: Pixie Casino, MD;  Location: [facility] ENDOSCOPY;  Service: Cardiovascular;  Laterality: N/A;     reports that she has never smoked. She has never used smokeless tobacco. She reports that she does not drink alcohol or use drugs.  No Known Allergies  Family History  Problem Relation Age of Onset  . Diabetes Mother   . Hypertension Mother   . Heart disease Father   . Heart attack Father   . Hypertension Father   . Diabetes Father   . Ovarian cancer Sister   . Liver cancer Sister   . Sickle cell anemia Daughter   . Schizophrenia Daughter   . Hypertension Sister   . Hypertension Brother   . Hypertension Daughter   . Kidney disease Sister        x2  .  Kidney disease Brother   . Stroke Neg Hx   . Esophageal cancer Neg Hx   . Colon cancer Neg Hx   . Colon polyps Neg Hx     Prior to Admission medications   Medication Sig Start Date End Date Taking? Authorizing Provider  ASPIRIN LOW DOSE 81 MG EC tablet Take 1 tablet (81 mg total) by mouth daily. Patient taking differently: Take 81 mg by mouth daily.  01/22/18  Yes Hilty, Nadean Corwin, MD  atorvastatin (LIPITOR) 20 MG tablet Take 1 tablet (20 mg total) by mouth daily at 6 PM. Please schedule  appointment for refills. 01/02/18  Yes Hilty, Nadean Corwin, MD  b complex vitamins tablet Take 1 tablet by mouth daily.   Yes [provider]  Dulaglutide (TRULICITY) 3.66 YQ/0.3KV SOPN Inject 0.75 mg into the skin every Saturday.   Yes [provider]  feeding supplement, ENSURE ENLIVE, (ENSURE ENLIVE) LIQD Take 237 mLs by mouth 3 (three) times daily between meals. 08/17/18  Yes Oretha Milch D, MD  furosemide (LASIX) 20 MG tablet Take 1 tablet (20 mg total) by mouth daily. 09/02/18 09/02/19 Yes Georgette Shell, MD  metFORMIN (GLUCOPHAGE-XR) 500 MG 24 hr tablet Take 500 mg by mouth 2 (two) times daily. 08/06/18  Yes [provider]  ondansetron (ZOFRAN) 4 MG tablet Take 1 tablet (4 mg total) by mouth every 6 (six) hours as needed for nausea. 09/02/18  Yes Georgette Shell, MD  pantoprazole (PROTONIX) 40 MG tablet Take 1 tablet (40 mg total) by mouth daily. 08/17/18  Yes Oretha Milch D, MD  potassium chloride (K-DUR) 10 MEQ tablet Take 1 tablet (10 mEq total) by mouth daily. 09/02/18  Yes Georgette Shell, MD  warfarin (COUMADIN) 5 MG tablet Tale 1.5 Tablets (7.5mg )daily OR as directed by coumadin clinic Patient taking differently: Take 5-7.5 mg by mouth See admin instructions. Take 1 1/2 tablets (7.5 mg) on Monday and Friday evening, take 1 tablet (5 mg) on Sunday, Tuesday, Wednesday, Thursday, Saturday evening or as directed by coumadin clinic 05/29/18  Yes Pixie Casino, MD    Physical Exam: Vitals:   09/07/18 1906 09/07/18 2100 09/07/18 2200 09/08/18 0000  BP:  (!) 160/100 (!) 156/102 (!) 156/106  Pulse:  (!) 142 68 (!) 140  Resp:  17 18 19   Temp:      TempSrc:      SpO2:  98% 95% 99%  Weight: 49.9 kg     Height: 5\' 3"  (1.6 m)       Constitutional: thin woman in NAD, calm, comfortable Eyes: PERRL, lids and conjunctivae normal ENMT: Mucous membranes are dry. Posterior pharynx clear of any exudate or lesions. Poor dentition.  Neck: normal, supple, no  masses Respiratory: clear to auscultation bilaterally, no wheezing, no crackles. Normal respiratory effort. Cardiovascular: tachycardic, no murmurs / rubs / gallops. No extremity edema. 2+ pedal pulses.  Abdomen: no tenderness, no masses palpated. No hepatosplenomegaly. Bowel sounds positive.  Musculoskeletal: no clubbing / cyanosis. No joint deformity upper and lower extremities. Good ROM, no contractures. Normal muscle tone.  Skin: no rashes, lesions, ulcers. No induration Neurologic: CN 2-12 grossly intact. Sensation intact, DTR normal. Strength 5/5 in all 4.  Psychiatric: Poor insight. Alert and oriented x 3. Odd affect  Labs on Admission: I have personally reviewed following labs and imaging studies  CBC: Recent Labs  Lab 09/01/18 1431 09/02/18 0051 09/07/18 2100  WBC 11.3* 10.4 7.5  NEUTROABS 9.5*  --   --  HGB 12.8 13.3 13.0  HCT 40.2 42.6 41.5  MCV 67.1* 69.8* 69.6*  PLT 242 256 993   Basic Metabolic Panel: Recent Labs  Lab 09/01/18 1431 09/02/18 0051 09/07/18 2100  NA 145 140 143  K 3.0* 4.4 3.9  CL 112* 105 108  CO2 22 24 27   GLUCOSE 178* 269* 167*  BUN 10 10 13   CREATININE 0.65 0.93 0.59  CALCIUM 8.4* 9.0 7.3*  MG 1.4* 2.4  --    GFR: Estimated Creatinine Clearance: 56.7 mL/min (by C-G formula based on SCr of 0.59 mg/dL). Liver Function Tests: Recent Labs  Lab 09/01/18 1431 09/07/18 2100  AST 21 32  ALT 31 17  ALKPHOS 40 29*  BILITOT 1.2 1.3*  PROT 6.3* 5.4*  ALBUMIN 3.4* 2.9*   Recent Labs  Lab 09/07/18 2100  LIPASE 34   Recent Labs  Lab 09/07/18 2050  AMMONIA 23   Coagulation Profile: Recent Labs  Lab 09/01/18 1600 09/02/18 0051 09/07/18 2100  INR 2.50 2.54 1.66   Cardiac Enzymes: Recent Labs  Lab 09/01/18 1431 09/01/18 1836 09/02/18 0051 09/02/18 0618  TROPONINI 0.07* 0.07* 0.05* 0.04*   BNP (last 3 results) No results for input(s): PROBNP in the last 8760 hours. HbA1C: No results for input(s): HGBA1C in the last 72  hours. CBG: Recent Labs  Lab 09/01/18 1835 09/01/18 2018 09/02/18 0740 09/02/18 1103 09/07/18 2202  GLUCAP 172* 207* 253* 252* 152*   Lipid Profile: No results for input(s): CHOL, HDL, LDLCALC, TRIG, CHOLHDL, LDLDIRECT in the last 72 hours. Thyroid Function Tests: No results for input(s): TSH, T4TOTAL, FREET4, T3FREE, THYROIDAB in the last 72 hours. Anemia Panel: No results for input(s): VITAMINB12, FOLATE, FERRITIN, TIBC, IRON, RETICCTPCT in the last 72 hours. Urine analysis:    Component Value Date/Time   COLORURINE YELLOW 09/07/2018 2211   APPEARANCEUR CLEAR 09/07/2018 2211   LABSPEC 1.014 09/07/2018 2211   PHURINE 7.0 09/07/2018 2211   GLUCOSEU NEGATIVE 09/07/2018 2211   HGBUR NEGATIVE 09/07/2018 2211   BILIRUBINUR NEGATIVE 09/07/2018 [place] 09/07/2018 2211   PROTEINUR 100 (A) 09/07/2018 2211   UROBILINOGEN 1.0 09/07/2014 1405   NITRITE NEGATIVE 09/07/2018 2211   LEUKOCYTESUR TRACE (A) 09/07/2018 2211    Radiological Exams on Admission: No results found.  EKG: Independently reviewed. Sinus tachcyardia, rate in 140s. Right axis deviation. LVH.  Assessment/Plan Active Problems:   Nausea with vomiting  Acute on chronic nausea, vomiting and abdominal pain - Etiology is unclear, perhaps diabetic gastroparesis, doubt GERD/PUD but needs further workup, pt not volume overloaded on my exam to suggest decompensated heart failure as a cause of her abdominal pain - Will forego further CT imaging given multiple recent negative studies - Consider GI consult for EGD/colonoscopy - Reglan 10 mg IV Q6H - Gastroparesis step 1 diet - Antiemetics PRN - Consider addition of Bentyl or TCA  Sinus tachycardia - Multiple EKG not consistent with Afib/flutter - In setting of acute nausea, vomiting and abdominal pain - Minimal response to volume resuscitation, will hold further IVF - Pain control, anti-emetics as above - Monitor on telemetry - Resume home  metoprolol  HFrEF, LV thrombus - Continue warfarin per home - Daily PT/INR - Check BNP - Resume home beta blocker - Continue ASA, Lipitor - Pt would probably benefit from ACE, unclear as to why this medication was discontinued  DM2 with possible gastroparesis - Hold home Trulicity - Start sliding scale insulin - FSBS ACHS - Check A1c  DVT prophylaxis: Warfarin  Code Status: Full Family Communication: Spouse, Kristen Lozano Disposition Plan: Home in 2 days Consults called: None Admission status: Inpatient, telemetry   Kristen Dutton Sharene Butters MD [facility]  If 7PM-7AM, please contact night-coverage [email or web address] Password [email or web address]  09/08/2018, 1:00 AM

## 2018-09-08 NOTE — Consult Note (Signed)
[facility] Gastroenterology Consultation Note  Referring Provider: Dr. Baird Kay [facility]) Primary Care Physician:  Audley Hose, MD Primary Gastroenterologist:  Dr. Lucio Edward  Reason for Consultation:  Nausea and vomiting  HPI: Kristen Lozano is a 62 y.o. female admitted for nausea and vomiting.  Follows with Dr. Fuller Plan.  Husband tells me patient diagnosed with gastroparesis, but I don't see gastric emptying study in chart and unclear if she saw outside gastroenterologist for this evaluation.  In any event, her symptoms years ago were nausea and vomiting, reportedly diagnosed with gastroparesis after seeing "specialist," and symptoms quickly resolved.  She has been doing well from GI perspective for several years until a couple weeks ago.  At this time, patient developed ill-defined abdominal pain as well as nausea and vomiting.  +weight loss.   Was discharged from hospital recently for this, then re-presented with same symptoms.  Patient diagnosed with UTI and given antibiotics; also had adjustments in her CHF medications; after this was done, patient's husband (who provides most of today's history) reports patient started having generalized abdominal pains and nausea/vomiting.  Low albumin, LFTs ok.  Couple recent CTs showed no acute abdominal pathology, but did show bilateral pleural effusions with secondary atelectasis.  Nevertheless, with this in mind, today she feels much better.  Is tolerating soft diet without vomiting.  No blood in stool.   Past Medical History:  Diagnosis Date  . Allergic rhinitis    Requires cetirizine, singulair, and fluticasone.  . Anxiety    Has been on Xanax since 2009. Uses it for stress, anxiety, and insomnia. No contract yet.  . Arthritis   . CHF (congestive heart failure) (HCC)    EF 35% after stroke, presumed ischemic  . Chronic pain    Has OA of knees B. No Xrays in echart. Not requiring narcotics.  . Colitis 12/2017  . Depression   . Diabetes mellitus     Type 2, non insulin dependent. Was dx'd prior to 2008.  Marland Kitchen Hyperglycemia   . Hyperlipemia   . Hypertension   . Sickle cell trait ([place])   . Stroke [facility]) 09/05/14   Dominant left MCA infarcts secondary to unknown embolic source     Past Surgical History:  Procedure Laterality Date  . TEE WITHOUT CARDIOVERSION N/A 09/06/2014   Procedure: TRANSESOPHAGEAL ECHOCARDIOGRAM (TEE);  Surgeon: Pixie Casino, MD;  Location: [facility] ENDOSCOPY;  Service: Cardiovascular;  Laterality: N/A;    Prior to Admission medications   Medication Sig Start Date End Date Taking? Authorizing Provider  ASPIRIN LOW DOSE 81 MG EC tablet Take 1 tablet (81 mg total) by mouth daily. Patient taking differently: Take 81 mg by mouth daily.  01/22/18  Yes Hilty, Nadean Corwin, MD  atorvastatin (LIPITOR) 20 MG tablet Take 1 tablet (20 mg total) by mouth daily at 6 PM. Please schedule appointment for refills. 01/02/18  Yes Hilty, Nadean Corwin, MD  b complex vitamins tablet Take 1 tablet by mouth daily.   Yes [provider]  Dulaglutide (TRULICITY) 2.37 SE/8.3TD SOPN Inject 0.75 mg into the skin every Saturday.   Yes [provider]  feeding supplement, ENSURE ENLIVE, (ENSURE ENLIVE) LIQD Take 237 mLs by mouth 3 (three) times daily between meals. 08/17/18  Yes Oretha Milch D, MD  furosemide (LASIX) 20 MG tablet Take 1 tablet (20 mg total) by mouth daily. 09/02/18 09/02/19 Yes Georgette Shell, MD  metFORMIN (GLUCOPHAGE-XR) 500 MG 24 hr tablet Take 500 mg by mouth 2 (two) times daily.  08/06/18  Yes [provider]  ondansetron (ZOFRAN) 4 MG tablet Take 1 tablet (4 mg total) by mouth every 6 (six) hours as needed for nausea. 09/02/18  Yes Georgette Shell, MD  pantoprazole (PROTONIX) 40 MG tablet Take 1 tablet (40 mg total) by mouth daily. 08/17/18  Yes Oretha Milch D, MD  potassium chloride (K-DUR) 10 MEQ tablet Take 1 tablet (10 mEq total) by mouth daily. 09/02/18  Yes Georgette Shell, MD  warfarin  (COUMADIN) 5 MG tablet Tale 1.5 Tablets (7.5mg )daily OR as directed by coumadin clinic Patient taking differently: Take 5-7.5 mg by mouth See admin instructions. Take 1 1/2 tablets (7.5 mg) on Monday and Friday evening, take 1 tablet (5 mg) on Sunday, Tuesday, Wednesday, Thursday, Saturday evening or as directed by coumadin clinic 05/29/18  Yes Hilty, Nadean Corwin, MD    Current Facility-Administered Medications  Medication Dose Route Frequency Provider Last Rate Last Dose  . 0.9 %  sodium chloride infusion   Intravenous PRN Raiford Noble Latif, DO 10 mL/hr at 09/08/18 1215 250 mL at 09/08/18 1215  . acetaminophen (TYLENOL) tablet 650 mg  650 mg Oral Q6H PRN Bennie Pierini, MD       Or  . acetaminophen (TYLENOL) suppository 650 mg  650 mg Rectal Q6H PRN Bennie Pierini, MD      . aspirin EC tablet 81 mg  81 mg Oral Daily Bennie Pierini, MD   81 mg at 09/08/18 0914  . atorvastatin (LIPITOR) tablet 20 mg  20 mg Oral q1800 Bennie Pierini, MD      . docusate sodium (COLACE) capsule 100 mg  100 mg Oral BID Bennie Pierini, MD   100 mg at 09/08/18 0914  . feeding supplement (ENSURE ENLIVE) (ENSURE ENLIVE) liquid 237 mL  237 mL Oral TID BM Bennie Pierini, MD   237 mL at 09/08/18 1325  . insulin aspart (novoLOG) injection 0-5 Units  0-5 Units Subcutaneous QHS Bennie Pierini, MD      . insulin aspart (novoLOG) injection 0-9 Units  0-9 Units Subcutaneous TID WC Bennie Pierini, MD   3 Units at 09/08/18 1211  . metoCLOPramide (REGLAN) injection 10 mg  10 mg Intravenous Q6H Bennie Pierini, MD   10 mg at 09/08/18 1211  . [START ON 09/09/2018] metoprolol tartrate (LOPRESSOR) tablet 75 mg  75 mg Oral BID Bennie Pierini, MD      . ondansetron [facility]) tablet 4 mg  4 mg Oral Q6H PRN Bennie Pierini, MD       Or  . ondansetron [facility]) injection 4 mg  4 mg Intravenous Q6H PRN Bennie Pierini, MD      . pantoprazole (PROTONIX) EC tablet 40 mg  40 mg Oral Daily Bennie Pierini, MD   40 mg at 09/08/18 0914  . polyethylene glycol (MIRALAX / GLYCOLAX) packet 17 g  17 g Oral Daily Bennie Pierini, MD   17 g at 09/08/18 0913  . potassium chloride 10 mEq in 100 mL IVPB  10 mEq Intravenous Q1 Hr x 4 SheikhGeorgina Quint Melvin, DO 100 mL/hr at 09/08/18 1350 10 mEq at 09/08/18 1350  . potassium chloride SA (K-DUR,KLOR-CON) CR tablet 40 mEq  40 mEq Oral BID Raiford Noble Seth Ward, DO   40 mEq at 09/08/18 6606  . senna (SENOKOT) tablet 8.6 mg  1 tablet Oral BID Bennie Pierini, MD   8.6 mg at 09/08/18 0914  . warfarin (COUMADIN)  tablet 5 mg  5 mg Oral Once per day on Sun Tue Wed Thu Sat Bennie Pierini, MD      . Derrill Memo ON 09/11/2018] warfarin (COUMADIN) tablet 7.5 mg  7.5 mg Oral Once per day on Mon Fri Bennie Pierini, MD      . Warfarin - Physician Dosing Inpatient   Does not apply [id number] Bennie Pierini, MD        Allergies as of 09/07/2018  . (No Known Allergies)    Family History  Problem Relation Age of Onset  . Diabetes Mother   . Hypertension Mother   . Heart disease Father   . Heart attack Father   . Hypertension Father   . Diabetes Father   . Ovarian cancer Sister   . Liver cancer Sister   . Sickle cell anemia Daughter   . Schizophrenia Daughter   . Hypertension Sister   . Hypertension Brother   . Hypertension Daughter   . Kidney disease Sister        x2  . Kidney disease Brother   . Stroke Neg Hx   . Esophageal cancer Neg Hx   . Colon cancer Neg Hx   . Colon polyps Neg Hx     Social History   Socioeconomic History  . Marital status: Legally Separated    Spouse name: Not on file  . Number of children: 2  . Years of education: 9 TH  . Highest education level: Not on file  Occupational History  . Occupation: Disabled  Social Needs  . Financial resource strain: Not on file  . Food insecurity:    Worry: Not on file    Inability: Not on file  . Transportation needs:    Medical: Not on file    Non-medical: Not on file  Tobacco Use   . Smoking status: Never Smoker  . Smokeless tobacco: Never Used  Substance and Sexual Activity  . Alcohol use: No    Alcohol/week: 0.0 standard drinks  . Drug use: No  . Sexual activity: Not on file  Lifestyle  . Physical activity:    Days per week: Not on file    Minutes per session: Not on file  . Stress: Not on file  Relationships  . Social connections:    Talks on phone: Not on file    Gets together: Not on file    Attends religious service: Not on file    Active member of club or organization: Not on file    Attends meetings of clubs or organizations: Not on file    Relationship status: Not on file  . Intimate partner violence:    Fear of current or ex partner: Not on file    Emotionally abused: Not on file    Physically abused: Not on file    Forced sexual activity: Not on file  Other Topics Concern  . Not on file  Social History Narrative   Patient is married with 2 children.   Patient is right handed.   Patient has 9 th grade education.   Patient drinks 2 cups daily.    Review of Systems: As per HPI, all others negative  Physical Exam: Vital signs in last 24 hours: Temp:  [98.3 F (36.8 C)-98.6 F (37 C)] 98.3 F (36.8 C) (10/15 0854) Pulse Rate:  [65-142] 65 (10/15 0854) Resp:  [15-20] 16 (10/15 0854) BP: (117-160)/(82-106) 147/100 (10/15 0854) SpO2:  [95 %-100 %] 100 % (10/15 0854) Weight:  [49.9  kg] 49.9 kg (10/15 0159) Last BM Date: (pta) General:   Alert, chronically ill-appearing, but NAD Head:  Normocephalic and atraumatic. Eyes:  Sclera clear, no icterus.   Conjunctiva pink. Ears:  Normal auditory acuity. Nose:  No deformity, discharge,  or lesions. Mouth:  No deformity or lesions.  Oropharynx pink & moist. Neck:  Supple; no masses or thyromegaly. Abdomen:  Soft, nontender and nondistended. No masses, hepatosplenomegaly or hernias noted. Normal bowel sounds, without guarding, and without rebound.     Msk:  Symmetrical without gross  deformities. Normal posture. Pulses:  Normal pulses noted. Extremities:  Without clubbing or edema. Neurologic:  Alert and  oriented x4; diffusely weak, otherwise grossly normal neurologically. Skin:  Intact without significant lesions or rashes. Cervical Nodes:  No significant cervical adenopathy. Psych:  Alert and cooperative. Depressed mood, flat affect   Lab Results: Recent Labs    09/07/18 2100 09/08/18 0240  WBC 7.5 6.7  HGB 13.0 13.3  HCT 41.5 42.3  PLT 205 211   BMET Recent Labs    09/07/18 2100 09/08/18 0240  NA 143 143  K 3.9 2.9*  CL 108 108  CO2 27 25  GLUCOSE 167* 187*  BUN 13 13  CREATININE 0.59 0.59  CALCIUM 7.3* 8.4*   LFT Recent Labs    09/07/18 2100  PROT 5.4*  ALBUMIN 2.9*  AST 32  ALT 17  ALKPHOS 29*  BILITOT 1.3*   PT/INR Recent Labs    09/07/18 2100 09/08/18 0240  LABPROT 19.5* 18.0*  INR 1.66 1.50    Studies/Results: No results found.  Impression:  1.  N/V, abdominal pain. 2.  Gastroparesis.  Diagnosis made years ago in midst of evaluation of nausea/vomiting; had been quiescent for years until a couple weeks ago. 3.  Recent urinary infection and adjustment of cardiac medications per husband.  New medication and infection therein likely at least contributed to her nausea and vomiting (which hitherto had been quiescent for several years). 4.  Systolic heart failure.  Cardiac cachexia could certainly cause some of patient's nausea/vomiting, failure-to-thrive, but doubt it would be root cause of her abrupt worsening of symptoms couple weeks ago. 5.  Chronic anticoagulation.  Plan:  1.  Patient is feeling better today; eating soft food, and through the duration of my interview, no nausea/vomiting noted. 2.  Supportive therapy:  Antiemetics, soft diet advancing as tolerated. 3.  Patient not good endoscopy candidate at present time given her multiple comorbidities and low yield of findings any useful information. 4.  Patient  hopefully can go home soon, if eating ok and no further vomiting, and can follow up with Dr. Fuller Plan (her primary gastroenterologist) as outpatient. 5.  [facility] will revisit tomorrow.   LOS: 0 days   Carolynne Schuchard M  09/08/2018, 1:58 PM  Cell [phone] If no answer or after 5 PM call [phone]

## 2018-09-08 NOTE — Care Management CC44 (Deleted)
Condition Code 44 Documentation Completed  Patient Details  Name: Kristen Lozano MRN: [id number] Date of Birth: 1956/01/10   Condition Code 44 given:    Patient signature on Condition Code 44 notice:    Documentation of 2 MD's agreement:    Code 44 added to claim:       Rae Mar, RN 09/08/2018, 4:37 PM

## 2018-09-09 ENCOUNTER — Encounter: Payer: Self-pay | Admitting: *Deleted

## 2018-09-09 DIAGNOSIS — E440 Moderate protein-calorie malnutrition: Secondary | ICD-10-CM

## 2018-09-09 DIAGNOSIS — I5042 Chronic combined systolic (congestive) and diastolic (congestive) heart failure: Secondary | ICD-10-CM | POA: Diagnosis not present

## 2018-09-09 DIAGNOSIS — Z5181 Encounter for therapeutic drug level monitoring: Secondary | ICD-10-CM

## 2018-09-09 DIAGNOSIS — I236 Thrombosis of atrium, auricular appendage, and ventricle as current complications following acute myocardial infarction: Secondary | ICD-10-CM | POA: Diagnosis not present

## 2018-09-09 DIAGNOSIS — G8929 Other chronic pain: Secondary | ICD-10-CM | POA: Diagnosis not present

## 2018-09-09 DIAGNOSIS — R1084 Generalized abdominal pain: Secondary | ICD-10-CM | POA: Diagnosis not present

## 2018-09-09 DIAGNOSIS — R112 Nausea with vomiting, unspecified: Secondary | ICD-10-CM | POA: Diagnosis not present

## 2018-09-09 DIAGNOSIS — Z7901 Long term (current) use of anticoagulants: Secondary | ICD-10-CM

## 2018-09-09 LAB — BASIC METABOLIC PANEL
Anion gap: 7 (ref 5–15)
BUN: 12 mg/dL (ref 8–23)
CHLORIDE: 102 mmol/L (ref 98–111)
CO2: 28 mmol/L (ref 22–32)
CREATININE: 0.75 mg/dL (ref 0.44–1.00)
Calcium: 8.6 mg/dL — ABNORMAL LOW (ref 8.9–10.3)
GFR calc non Af Amer: >60 mL/min (ref >60)
Glucose, Bld: 164 mg/dL — ABNORMAL HIGH (ref 70–99)
POTASSIUM: 3.5 mmol/L (ref 3.5–5.1)
Sodium: 137 mmol/L (ref 135–145)

## 2018-09-09 LAB — PROTIME-INR
INR: 1.6
PROTHROMBIN TIME: 18.9 s — AB (ref 11.4–15.2)

## 2018-09-09 LAB — GLUCOSE, CAPILLARY
GLUCOSE-CAPILLARY: 232 mg/dL — AB (ref 70–99)
Glucose-Capillary: 128 mg/dL — ABNORMAL HIGH (ref 70–99)
Glucose-Capillary: 187 mg/dL — ABNORMAL HIGH (ref 70–99)

## 2018-09-09 MED ORDER — METOPROLOL TARTRATE 50 MG PO TABS
75.0000 mg | ORAL_TABLET | Freq: Two times a day (BID) | ORAL | Status: DC
  Administered 2018-09-09: 75 mg via ORAL
  Filled 2018-09-09: qty 1

## 2018-09-09 MED ORDER — WARFARIN - PHARMACIST DOSING INPATIENT: Freq: Every day | Status: DC

## 2018-09-09 MED ORDER — METOPROLOL TARTRATE 75 MG PO TABS: 75.0000 mg | ORAL_TABLET | Freq: Two times a day (BID) | ORAL | 0 refills | Status: DC

## 2018-09-09 MED ORDER — SPIRONOLACTONE 25 MG PO TABS: 25.0000 mg | ORAL_TABLET | Freq: Every day | ORAL | 11 refills | Status: DC

## 2018-09-09 MED ORDER — DOCUSATE SODIUM 100 MG PO CAPS: 100.0000 mg | ORAL_CAPSULE | Freq: Two times a day (BID) | ORAL | 0 refills | Status: DC

## 2018-09-09 MED ORDER — POLYETHYLENE GLYCOL 3350 17 G PO PACK: 17.0000 g | PACK | Freq: Every day | ORAL | 0 refills | Status: DC | PRN

## 2018-09-09 MED ORDER — WARFARIN SODIUM 7.5 MG PO TABS
7.5000 mg | ORAL_TABLET | Freq: Once | ORAL | Status: AC
  Administered 2018-09-09: 7.5 mg via ORAL
  Filled 2018-09-09: qty 1

## 2018-09-09 NOTE — Plan of Care (Signed)
Came by this afternoon to check on patient; she was in bathroom and unavailable; will revisit tomorrow.

## 2018-09-09 NOTE — Progress Notes (Signed)
Patient had supper. Patient was tolerance well with meal, no symptoms of nausea/vomiting.

## 2018-09-09 NOTE — Care Management Obs Status (Signed)
[place] NOTIFICATION   Patient Details  Name: RONNIKA COLLETT MRN: [id number] Date of Birth: October 23, 1956   Medicare Observation Status Notification Given:  Yes    Gerome Kokesh, Benjaman Lobe, RN 09/09/2018, 12:26 PM

## 2018-09-09 NOTE — Care Management CC44 (Signed)
Condition Code 44 Documentation Completed  Patient Details  Name: Kristen Lozano MRN: [id number] Date of Birth: 1955-12-25   Condition Code 44 given:  Yes Patient signature on Condition Code 44 notice:  Yes Documentation of 2 MD's agreement:  Yes Code 44 added to claim:  Yes    Samari Bittinger, Benjaman Lobe, RN 09/09/2018, 12:27 PM

## 2018-09-09 NOTE — Care Management Obs Status (Signed)
[place] NOTIFICATION   Patient Details  Name: NAMIYAH GRANTHAM MRN: [id number] Date of Birth: 08-Apr-1956   Medicare Observation Status Notification Given:  Yes    Mason, [place], RN 09/09/2018, 10:59 AM

## 2018-09-09 NOTE — Progress Notes (Signed)
[facility]  Patient Status: Active (receiving services up to time of hospitalization)  [facility] is providing the following services: RN, PT, OT and MSW  If patient discharges after hours, please call [phone].   Kristen Lozano 09/09/2018, 9:15 AM

## 2018-09-09 NOTE — Discharge Instructions (Addendum)
Make sure to take the following medicines at home  Metoprolol 75 mg ( 1 tab) twice a day. This helps control your heart rate  Spirinolactone 25 mg ( 1 tab) once a day. This helps extra fluid in body drom building up and helps with strength of your heart.    Lasix 20 mg mg ( 1 tab) once a day. This helps extra fluid from building up in your body and makes you pee  You will eat a Dysphagia 3 diet and types of food to eat are listed below:  Dysphagia Diet Level 3, Mechanically Advanced The dysphagia level 3 diet includes foods that are soft, moist, and can be chopped into 1-inch chunks. This diet is helpful for people with mild swallowing difficulties. It reduces the risk of food getting caught in the windpipe, trachea, or lungs. What do I need to know about this diet?  You may eat foods that are soft and moist.  If you were on the dysphagia level 1 or level 2 diets, you may eat any of the foods included on those lists.  Avoid foods that are dry, hard, sticky, chewy, coarse, and crunchy. Also avoid large cuts of food.  Take small bites. Each bite should contain 1 inch or less of food.  Thicken liquids if instructed by your health care provider. Follow your health care provider's instructions on how to do this and to what consistency.  See your dietitian or speech language pathologist regularly for help with your dietary changes. What foods can I eat? Grains Moist breads without nuts or seeds. Biscuits, muffins, pancakes, and waffles well-moistened with syrup, jelly, margarine, or butter. Smooth cereals with plenty of milk to moisten them. Moist bread stuffing. Moist rice. Vegetables All cooked, soft vegetables. Shredded lettuce. Tender fried potatoes. Fruits All canned and cooked fruits. Soft, peeled fresh fruits, such as peaches, nectarines, kiwis, cantaloupe, honeydew melon, and watermelon without seeds. Soft berries, such as strawberries. Meat and Other Protein Sources Moist ground  or finely diced or sliced meats. Solid, tender cuts of meat. Meatloaf. Hamburger with a bun. Sausage patty. Deli thin-sliced lunch meat. Chicken, egg, or tuna salad sandwich. Sloppy joe. Moist fish. Eggs prepared any way. Casseroles with small chunks of meats, ground meats, or tender meats. Dairy Cheese spreads without coarse large chunks. Shredded cheese. Cheese slices. Cottage cheese. Milk at the right texture. Smooth frappes. Yogurt without nuts or coconut. Ask your health care provider whether you can have frozen desserts (such as malts or milk shakes) and thin liquids. Sweets/Desserts Soft, smooth, moist desserts. Non-chewy, smooth candy. Jam. Jelly. Honey. Preserves. Ask your health care provider whether you can have frozen desserts. Fats and Oils Butter. Oils. Margarine. Mayonnaise. Gravy. Spreads. Other All seasonings and sweeteners. All sauces without large chunks. The items listed above may not be a complete list of recommended foods or beverages. Contact your dietitian for more options. What foods are not recommended? Grains Coarse or dry cereals. Dry breads. Toast. Crackers. Tough, crusty breads, such French bread and baguettes. Tough, crisp fried potatoes. Potato skins. Dry bread stuffing. Granola. Popcorn. Chips. Vegetables All raw vegetables except shredded lettuce. Cooked corn. Rubbery or stiff cooked vegetables. Stringy vegetables, such as celery. Fruits Hard fruits that are difficult to chew, such as apples or pears. Stringy, high-pulp fruits, such as pineapple, papaya, or mango. Fruits with tough skins, such as grapes. Coconut. All dried fruits. Fruit leather. Fruit roll-ups. Fruit snacks. Meat and Other Protein Sources Dry or tough meats or poultry.  Dry fish. Fish with bones. Peanut butter. All nuts and seeds. Dairy Any with nuts, seeds, chocolate chips, dried fruit, coconut, or pineapple. Sweets/Desserts Dry cakes. Chewy or dry cookies. Any with nuts, seeds, dry fruits,  coconut, pineapple, or anything dry, sticky, or hard. Chewy caramel. Licorice. Taffy-type candies. Ask your health care provider whether you can have frozen desserts. Fats and Oils Any with chunks, nuts, seeds, or pineapple. Olives. Kristen Lozano. Other Soups with tough or large chunks of meats, poultry, or vegetables. Corn or clam chowder. The items listed above may not be a complete list of foods and beverages to avoid. Contact your dietitian for more information. This information is not intended to replace advice given to you by your health care provider. Make sure you discuss any questions you have with your health care provider. Document Released: 11/11/2005 Document Revised: 04/18/2016 Document Reviewed: 10/25/2013 Elsevier Interactive Patient Education  [employer].

## 2018-09-09 NOTE — Progress Notes (Signed)
Patient has discharged to home on 09/09/18. Discharge instruction including medication and appointment was given to patient. Patient has no question at this time. Notified patient's husband about discharge plan as well.

## 2018-09-09 NOTE — Discharge Summary (Signed)
Discharge Summary  Kristen Lozano HFW:[id number] DOB: 28-Feb-1956  PCP: Audley Hose, MD  Admit date: 09/07/2018 Discharge date: 09/09/2018   Time spent: < 25 minutes  Admitted From: Home Disposition:  Home  Recommendations for Outpatient Follow-up:  1. Follow up with PCP in home 1 to 2 weeks 2. Spirinolactone 26 mg daily, Metoprolol 75 mg BID 3. Will continue to defer starting lisinopril to outpatient follow up with Cardiology( BP was limiting factor this hospital stay)    Discharge Diagnoses:  Active Hospital Problems   Diagnosis Date Noted  . Nausea and vomiting 09/08/2018  . Nausea with vomiting 09/07/2018    Resolved Hospital Problems  No resolved problems to display.    Discharge Condition: Stable  CODE STATUS: Full full code Diet recommendation: Mechanical soft diet   History of present illness:  Kristen Lozano is a 62 y.o. year old female with medical history significant for chronic CHF with reduced EF, chronic sinus tachycardia, type 2 diabetes presumed history of gastroparesis, hypertension, hyperlipidemia, chronic abdominal pain with multiple hospitalizations for similar complaints last hospitalized on 10/8-10/9.  Who presented on 09/07/2018 with diffuse abdominal pain worse with eating with accompanying nausea and vomiting.   Remaining hospital course addressed in problem based format below:   Hospital Course:  Acute on chronic abdominal pain with nausea and vomiting, resolved.  Unclear etiology of patient's symptoms, with multiple CT abdominal imaging in the last month with no acute findings, UA unremarkable, and quick resolution of symptoms with supportive care including fluid resuscitation, Reglan and IV Zofran as well as fentanyl for pain control.  GI was consulted and agreed with supportive care given patient was able to tolerate eating soft food in the last 24 hours for observation.  They do not deem her a good endoscopic candidate given her multiple  comorbidities and low yield of any useful findings.  Patient was instructed to continue mechanical soft diet, Reglan was not prescribed on discharge to avoid QTC prolongation.  Chronic CHF with reduced EF, no acute exacerbation on admission.  BNP was actually decreased from prior baseline (403 on admission, down from 1235 a week prior).  No overt signs of volume overload on exam.  Instructed to continue home Lasix.  At recent hospitalization patient's home spironolactone was discontinued she was instructed to resume that on this discharge as previously recommended by cardiology.  We will continue to hold off on lisinopril to further discuss as an outpatient with cardiology given limited blood pressure during observation.  Additionally further ischemic evaluation for reduced EF to be followed by cardiology  Chronic sinus tachycardia, improving.  On last evaluation by cardiology on 07/2018 presume reduced EF was likely related to tachyarrhythmia which has consistently been sinus tachycardia.  At that time patient was started on last  metoprolol 75 twice daily.  The past week patient has been off that medication as that too was discontinued on recent discharge from hospital by mistake.  Peak heart rate of 145 with reinitiation of metoprolol heart rate range from 1 teens to 120s on discharge.  Again instructed patient to resume this medication.  Discussed this assessment with cardiology who will reschedule outpatient follow-up with patient.  History of LV apical thrombus.  On Coumadin regimen at home.  INR 1.66 on admission, 1.6 on discharge, goal INR 2-3.  Will have close INR follow-up as outpatient.  Type 2 diabetes, will resume home regimen.  A1c here is 7.4%.  History of cognitive deficits? Able to answer questions  appropriately but family reports longstanding history of poor comprehension and memory. I wonder how much this affects her symptoms as she lives by herself and in charge of taking her home  medications. Family and patient have declined SNF in past hospitalizations though. Has had occasional home health aides.    Consultations:  GI  Procedures/Studies: None  Discharge Exam: BP 98/66   Pulse (!) 125   Temp 98.3 F (36.8 C) (Oral)   Resp 16   Ht 5' 3"  (1.6 m)   Wt 48.5 kg   SpO2 98%   BMI 18.94 kg/m   General: Thin frail, elderly female, Lying in bed, no apparent distress Eyes: EOMI, anicteric ENT: Oral Mucosa clear and moist Cardiovascular: regular rate and rhythm, no murmurs, rubs or gallops, no edema, Respiratory: Normal respiratory effort on room air, lungs clear to auscultation bilaterally Abdomen: soft, non-distended, non-tender, normal bowel sounds Skin: No Rash Neurologic: Grossly no focal neuro deficit.Mental status alert, oriented to self, place, and context Psychiatric:Appropriate affect, and mood   Discharge Instructions You were cared for by a hospitalist during your hospital stay. If you have any questions about your discharge medications or the care you received while you were in the hospital after you are discharged, you can call the unit and asked to speak with the hospitalist on call if the hospitalist that took care of you is not available. Once you are discharged, your primary care physician will handle any further medical issues. Please note that NO REFILLS for any discharge medications will be authorized once you are discharged, as it is imperative that you return to your primary care physician (or establish a relationship with a primary care physician if you do not have one) for your aftercare needs so that they can reassess your need for medications and monitor your lab values.  Discharge Instructions    Diet - low sodium heart healthy   Complete by:  As directed    Increase activity slowly   Complete by:  As directed    Increase activity slowly   Complete by:  As directed      Allergies as of 09/09/2018   No Known Allergies       Medication List    TAKE these medications   ASPIRIN LOW DOSE 81 MG EC tablet Generic drug:  aspirin Take 1 tablet (81 mg total) by mouth daily. What changed:  See the new instructions.   atorvastatin 20 MG tablet Commonly known as:  LIPITOR Take 1 tablet (20 mg total) by mouth daily at 6 PM. Please schedule appointment for refills.   b complex vitamins tablet Take 1 tablet by mouth daily.   docusate sodium 100 MG capsule Commonly known as:  COLACE Take 1 capsule (100 mg total) by mouth 2 (two) times daily.   feeding supplement (ENSURE ENLIVE) Liqd Take 237 mLs by mouth 3 (three) times daily between meals.   furosemide 20 MG tablet Commonly known as:  LASIX Take 1 tablet (20 mg total) by mouth daily.   metFORMIN 500 MG 24 hr tablet Commonly known as:  GLUCOPHAGE-XR Take 500 mg by mouth 2 (two) times daily.   Metoprolol Tartrate 75 MG Tabs Take 75 mg by mouth 2 (two) times daily.   ondansetron 4 MG tablet Commonly known as:  ZOFRAN Take 1 tablet (4 mg total) by mouth every 6 (six) hours as needed for nausea.   pantoprazole 40 MG tablet Commonly known as:  PROTONIX Take 1 tablet (40 mg total)  by mouth daily.   polyethylene glycol packet Commonly known as:  MIRALAX / GLYCOLAX Take 17 g by mouth daily as needed.   potassium chloride 10 MEQ tablet Commonly known as:  K-DUR Take 1 tablet (10 mEq total) by mouth daily.   spironolactone 25 MG tablet Commonly known as:  ALDACTONE Take 1 tablet (25 mg total) by mouth daily.   TRULICITY 6.60 YT/0.1SW Sopn Generic drug:  Dulaglutide Inject 0.75 mg into the skin every Saturday.   warfarin 5 MG tablet Commonly known as:  COUMADIN Take as directed. If you are unsure how to take this medication, talk to your nurse or doctor. Original instructions:  Tale 1.5 Tablets (7.58m)daily OR as directed by coumadin clinic What changed:  See the new instructions.      No Known Allergies    The results of significant  diagnostics from this hospitalization (including imaging, microbiology, ancillary and laboratory) are listed below for reference.    Significant Diagnostic Studies: Dg Chest 2 View  Result Date: 09/01/2018 CLINICAL DATA:  Generalized low abdominal pain since Sunday EXAM: CHEST - 2 VIEW COMPARISON:  08/10/2018 FINDINGS: Haziness of the bilateral chest. Chronic cardiomegaly. Coarsened lung markings without air bronchogram. No pneumothorax. Negative aortic and hilar contours. IMPRESSION: Small left pleural effusion with atelectasis in the lower lungs. Electronically Signed   By: JMonte FantasiaM.D.   On: 09/01/2018 15:34   Ct Abdomen Pelvis W Contrast  Result Date: 09/01/2018 CLINICAL DATA:  Acute generalized abdominal pain. EXAM: CT ABDOMEN AND PELVIS WITH CONTRAST TECHNIQUE: Multidetector CT imaging of the abdomen and pelvis was performed using the standard protocol following bolus administration of intravenous contrast. CONTRAST:  1096mISOVUE-300 IOPAMIDOL (ISOVUE-300) INJECTION 61% COMPARISON:  08/09/2018 CT FINDINGS: Lower chest: Stable cardiomegaly with moderate volume bilateral pleural effusions and compressive atelectasis as before. Hepatobiliary: Hepatic steatosis. No space-occupying mass. Physiologic distention of the gallbladder without stones. No biliary dilatation. Pancreas: Unremarkable. No pancreatic ductal dilatation or surrounding inflammatory changes. Spleen: Normal in size without focal abnormality. Adrenals/Urinary Tract: Normal bilateral adrenal glands. Scattered areas of bilateral renal cortical scarring are redemonstrated with subcentimeter hypodensities too small to further characterize but statistically consistent with cysts. No hydroureteronephrosis. The urinary bladder is unremarkable for the degree of distention. Stomach/Bowel: Stomach is within normal limits. Appendix appears normal. No evidence of bowel wall thickening, distention, or inflammatory changes. Vascular/Lymphatic: No  significant vascular findings are present. No enlarged abdominal or pelvic lymph nodes. Reproductive: Uterus and bilateral adnexa are unremarkable. Other: Trace physiologic free fluid in pelvis. Musculoskeletal: No acute or significant osseous findings. IMPRESSION: 1. No acute bowel obstruction or inflammatory change noted. 2. Stable cardiomegaly with moderate bilateral pleural effusions with compressive atelectasis. 3. Bilateral renal cortical scarring with too small to characterize subcentimeter hypodensities statistically consistent with cysts. Electronically Signed   By: DaAshley Royalty.D.   On: 09/01/2018 17:08   Dg Swallowing Func-speech Pathology  Result Date: 08/14/2018 Objective Swallowing Evaluation: Type of Study: MBS-Modified Barium Swallow Study  Patient Details Name: MaBRIANE BIRDENRN: [id number] of Birth: 12/27/14/1957oday's Date: 08/14/2018 Time: SLP Start Time (ACUTE ONLY): 1126SLP Stop Time (ACUTE ONLY): 1153 SLP Time Calculation (min) (ACUTE ONLY): 23 min Past Medical History: Past Medical History: Diagnosis Date . Allergic rhinitis   Requires cetirizine, singulair, and fluticasone. . Anxiety   Has been on Xanax since 2009. Uses it for stress, anxiety, and insomnia. No contract yet. . Arthritis  . CHF (congestive heart failure) (HCC)   EF 35%  after stroke, presumed ischemic . Chronic pain   Has OA of knees B. No Xrays in echart. Not requiring narcotics. . Colitis 12/2017 . Depression  . Diabetes mellitus   Type 2, non insulin dependent. Was dx'd prior to 2008. Marland Kitchen Hyperglycemia  . Hyperlipemia  . Hypertension  . Sickle cell trait ([place])  . 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 TEE WITHOUT CARDIOVERSION N/A 09/06/2014  Procedure: TRANSESOPHAGEAL ECHOCARDIOGRAM (TEE);  Surgeon: Pixie Casino, MD;  Location: [facility] ENDOSCOPY;  Service: Cardiovascular;  Laterality: N/A; HPI:  62 year old woman with a history of LV thrombus and CVA, hypertension, diabetes type 2, presenting to the ER with persistent nausea and vomiting. Per chart CT of the abdomen and pelvis showing thickened bowel loops in the left lower quadrant, infectious versus inflammatory.  Dx includes pna, likely due to aspiration per MD notes. Remote hx of a mild oral dysphagia per [facility] November 2015. BSE 08/12/18 suspected primary esophageal dysphagia, no concerns for oropharyngeal dysphagia and recommended work up for stricture/motility disorder and d/c ST. BSE reordered 9/10.  Subjective: sluggish; delayed responses Assessment / Plan / Recommendation CHL IP CLINICAL IMPRESSIONS 08/14/2018 Clinical Impression Pt exhibited moderate oral and mild pharyneal dysphagia marked by delayed transit and flash penetration. With thicker textures (puree, graham cracker) oral transit was delayed, significant difficulty propelling boluses lingual rocking motion and prolonged mastication with cracker. Initiation of swallow occured primarily at the pyriforms and vallecule with slight incomplete laryngeal closure with barium entering laryngeal vestibule and exiting during the swallow. No pharyngeal residue observed however of significant note, barium passed UES without difficulty but then reappeared ascending cervical esophagus into pyriform sinuses. MBS does not diagnose below the level of the UES however esophageal scan with subsequent swallows did not reveal overt abnormalities. Pt with neurological dysphagia likely from prior CVA however suspect mild behavioral component and suspect GERD. Would benefit from further work up for esophageal impairment SLP Visit Diagnosis Dysphagia, oropharyngeal phase (R13.12) Attention and concentration deficit following -- Frontal lobe and executive function deficit following -- Impact on safety and function Mild aspiration risk   CHL IP TREATMENT RECOMMENDATION 08/14/2018 Treatment Recommendations Therapy as  outlined in treatment plan below   Prognosis 08/14/2018 Prognosis for Safe Diet Advancement Fair Barriers to Reach Goals -- Barriers/Prognosis Comment -- CHL IP DIET RECOMMENDATION 08/14/2018 SLP Diet Recommendations Dysphagia 3 (Mech soft) solids;Thin liquid Liquid Administration via Straw;Cup Medication Administration Whole meds with liquid Compensations Small sips/bites;Slow rate Postural Changes Seated upright at 90 degrees;Remain semi-upright after after feeds/meals (Comment)   CHL IP OTHER RECOMMENDATIONS 08/14/2018 Recommended Consults Consider GI evaluation Oral Care Recommendations Oral care BID Other Recommendations --   CHL IP FOLLOW UP RECOMMENDATIONS 08/14/2018 Follow up Recommendations None   CHL IP FREQUENCY AND DURATION 08/14/2018 Speech Therapy Frequency (ACUTE ONLY) min 1 x/week Treatment Duration 2 weeks      CHL IP ORAL PHASE 08/14/2018 Oral Phase Impaired Oral - Pudding Teaspoon -- Oral - Pudding Cup -- Oral - Honey Teaspoon -- Oral - Honey Cup -- Oral - Nectar Teaspoon -- Oral - Nectar Cup -- Oral - Nectar Straw -- Oral - Thin Teaspoon -- Oral - Thin Cup WFL Oral - Thin Straw WFL Oral - Puree Lingual pumping;Reduced posterior propulsion;Delayed oral transit Oral - Mech Soft -- Oral - Regular Delayed oral transit;Reduced posterior propulsion Oral - Multi-Consistency -- Oral - Pill -- Oral Phase - Comment --  CHL IP PHARYNGEAL PHASE 08/14/2018 Pharyngeal Phase Impaired Pharyngeal- Pudding Teaspoon -- Pharyngeal -- Pharyngeal- Pudding Cup -- Pharyngeal -- Pharyngeal- Honey Teaspoon -- Pharyngeal -- Pharyngeal- Honey Cup -- Pharyngeal -- Pharyngeal- Nectar Teaspoon -- Pharyngeal -- Pharyngeal- Nectar Cup -- Pharyngeal -- Pharyngeal- Nectar Straw -- Pharyngeal -- Pharyngeal- Thin Teaspoon -- Pharyngeal -- Pharyngeal- Thin Cup Delayed swallow initiation-pyriform sinuses;Penetration/Aspiration during swallow Pharyngeal Material enters airway, remains ABOVE vocal cords then ejected out Pharyngeal- Thin Straw  Penetration/Aspiration during swallow Pharyngeal Material enters airway, remains ABOVE vocal cords then ejected out Pharyngeal- Puree Delayed swallow initiation-vallecula Pharyngeal -- Pharyngeal- Mechanical Soft -- Pharyngeal -- Pharyngeal- Regular WFL Pharyngeal -- Pharyngeal- Multi-consistency -- Pharyngeal -- Pharyngeal- Pill -- Pharyngeal -- Pharyngeal Comment --  CHL IP CERVICAL ESOPHAGEAL PHASE 08/14/2018 Cervical Esophageal Phase WFL Pudding Teaspoon -- Pudding Cup -- Honey Teaspoon -- Honey Cup -- Nectar Teaspoon -- Nectar Cup -- Nectar Straw -- Thin Teaspoon -- Thin Cup -- Thin Straw -- Puree -- Mechanical Soft -- Regular -- Multi-consistency -- Pill -- Cervical Esophageal Comment -- [place] Siren 08/14/2018, 3:25 PM Orbie Pyo Litaker M.Ed Actor Pager [phone] Office [phone]               Microbiology: No results found for this or any previous visit (from the past 240 hour(s)).   Labs: Basic Metabolic Panel: Recent Labs  Lab 09/07/18 2100 09/08/18 0240 09/09/18 1348  NA 143 143 137  K 3.9 2.9* 3.5  CL 108 108 102  CO2 27 25 28   GLUCOSE 167* 187* 164*  BUN 13 13 12   CREATININE 0.59 0.59 0.75  CALCIUM 7.3* 8.4* 8.6*  MG  --  1.5*  --   PHOS  --  2.8  --    Liver Function Tests: Recent Labs  Lab 09/07/18 2100  AST 32  ALT 17  ALKPHOS 29*  BILITOT 1.3*  PROT 5.4*  ALBUMIN 2.9*   Recent Labs  Lab 09/07/18 2100  LIPASE 34   Recent Labs  Lab 09/07/18 2050  AMMONIA 23   CBC: Recent Labs  Lab 09/07/18 2100 09/08/18 0240  WBC 7.5 6.7  HGB 13.0 13.3  HCT 41.5 42.3  MCV 69.6* 69.0*  PLT 205 211   Cardiac Enzymes: No results for input(s): CKTOTAL, CKMB, CKMBINDEX, TROPONINI in the last 168 hours. BNP: BNP (last 3 results) Recent Labs    08/09/18 1643 09/01/18 1600 09/08/18 0240  BNP 1,148.3* 1,235.6* 493.0*    ProBNP (last 3 results) No results for input(s): PROBNP in the last 8760 hours.  CBG: Recent Labs    Lab 09/08/18 1147 09/08/18 1620 09/08/18 2028 09/09/18 0740 09/09/18 1110  GLUCAP 237* 229* 168* 128* 232*       Signed:  Desiree Hane, MD [facility] 09/09/2018, 5:05 PM

## 2018-09-09 NOTE — Progress Notes (Signed)
HR= 131; Patient is alert and oriented x 3; patient is walking to bathroom. RN will monitor patient closely.

## 2018-09-09 NOTE — Progress Notes (Signed)
HR= 138; on telemetry; history of afib, CHF. Paged MD. RN will monitor patient and waiting for new order.

## 2018-09-09 NOTE — Progress Notes (Signed)
ANTICOAGULATION CONSULT NOTE -   Consult  Pharmacy Consult for warfarin Indication: Left ventricle thrombus  No Known Allergies  Patient Measurements: Height: 5\' 3"  (160 cm) Weight: 106 lb 14.8 oz (48.5 kg) IBW/kg (Calculated) : 52.4 Heparin Dosing Weight:   Vital Signs: Temp: 98.9 F (37.2 C) (10/16 0553) Temp Source: Oral (10/16 0553) BP: 133/88 (10/16 0553) Pulse Rate: 128 (10/16 1100)  Labs: Recent Labs    09/07/18 2100 09/08/18 0240 09/09/18 0559  HGB 13.0 13.3  --   HCT 41.5 42.3  --   PLT 205 211  --   LABPROT 19.5* 18.0* 18.9*  INR 1.66 1.50 1.60  CREATININE 0.59 0.59  --     Estimated Creatinine Clearance: 55.1 mL/min (by C-G formula based on SCr of 0.59 mg/dL).   Medications:  Scheduled:  . aspirin EC  81 mg Oral Daily  . atorvastatin  20 mg Oral q1800  . docusate sodium  100 mg Oral BID  . feeding supplement (ENSURE ENLIVE)  237 mL Oral TID BM  . insulin aspart  0-5 Units Subcutaneous QHS  . insulin aspart  0-9 Units Subcutaneous TID WC  . metoprolol tartrate  75 mg Oral BID  . pantoprazole  40 mg Oral Daily  . polyethylene glycol  17 g Oral Daily  . senna  1 tablet Oral BID    Assessment: Pharmacy is consulted to dose warfarin in 62 yo female with hx of left apical thrombus.   Pt home regimen is Warfarin 5 mg daily on Tues, Wed, Thur, Sat and Sun and 7.5 mg PO daily on Monday and Friday.  Today, 09/09/18   INR 1.60, subtherapeutic  Hgb, plt stable   No bleeding issues noted   No new meds started here     Goal of Therapy:  INR 2-3 Monitor platelets by anticoagulation protocol: Yes   Plan:   Warfarin 7.5 mg PO x1   Daily INR ordered  Monitor for signs and symptoms of bleeding   Royetta Asal, PharmD, BCPS Pager [phone] 09/09/2018 12:43 PM

## 2018-09-10 LAB — VITAMIN B1: Vitamin B1 (Thiamine): 77.6 nmol/L (ref 66.5–200.0)

## 2018-09-11 ENCOUNTER — Telehealth: Payer: Self-pay | Admitting: *Deleted

## 2018-09-11 NOTE — Telephone Encounter (Signed)
Left message for patient to call and schedule post hospital visit with Dr. Hilty 

## 2018-09-21 ENCOUNTER — Emergency Department (HOSPITAL_COMMUNITY): Payer: Medicare HMO

## 2018-09-21 ENCOUNTER — Other Ambulatory Visit: Payer: Self-pay

## 2018-09-21 ENCOUNTER — Observation Stay (HOSPITAL_COMMUNITY)
Admission: EM | Admit: 2018-09-21 | Discharge: 2018-09-25 | Disposition: A | Discharge status: Home / Self Care | Payer: Medicare HMO | Attending: Family Medicine | Admitting: Family Medicine

## 2018-09-21 ENCOUNTER — Encounter (HOSPITAL_COMMUNITY): Payer: Self-pay

## 2018-09-21 DIAGNOSIS — Z7984 Long term (current) use of oral hypoglycemic drugs: Secondary | ICD-10-CM | POA: Diagnosis not present

## 2018-09-21 DIAGNOSIS — Z23 Encounter for immunization: Secondary | ICD-10-CM | POA: Diagnosis not present

## 2018-09-21 DIAGNOSIS — R109 Unspecified abdominal pain: Secondary | ICD-10-CM | POA: Diagnosis present

## 2018-09-21 DIAGNOSIS — I236 Thrombosis of atrium, auricular appendage, and ventricle as current complications following acute myocardial infarction: Secondary | ICD-10-CM | POA: Diagnosis present

## 2018-09-21 DIAGNOSIS — Z7901 Long term (current) use of anticoagulants: Secondary | ICD-10-CM | POA: Diagnosis not present

## 2018-09-21 DIAGNOSIS — M170 Bilateral primary osteoarthritis of knee: Secondary | ICD-10-CM | POA: Diagnosis not present

## 2018-09-21 DIAGNOSIS — E1159 Type 2 diabetes mellitus with other circulatory complications: Secondary | ICD-10-CM | POA: Diagnosis present

## 2018-09-21 DIAGNOSIS — K2950 Unspecified chronic gastritis without bleeding: Secondary | ICD-10-CM | POA: Insufficient documentation

## 2018-09-21 DIAGNOSIS — K219 Gastro-esophageal reflux disease without esophagitis: Secondary | ICD-10-CM | POA: Insufficient documentation

## 2018-09-21 DIAGNOSIS — E876 Hypokalemia: Secondary | ICD-10-CM | POA: Insufficient documentation

## 2018-09-21 DIAGNOSIS — E785 Hyperlipidemia, unspecified: Secondary | ICD-10-CM | POA: Diagnosis not present

## 2018-09-21 DIAGNOSIS — I10 Essential (primary) hypertension: Secondary | ICD-10-CM | POA: Diagnosis present

## 2018-09-21 DIAGNOSIS — I5042 Chronic combined systolic (congestive) and diastolic (congestive) heart failure: Secondary | ICD-10-CM | POA: Diagnosis not present

## 2018-09-21 DIAGNOSIS — D573 Sickle-cell trait: Secondary | ICD-10-CM | POA: Insufficient documentation

## 2018-09-21 DIAGNOSIS — R112 Nausea with vomiting, unspecified: Secondary | ICD-10-CM | POA: Insufficient documentation

## 2018-09-21 DIAGNOSIS — E119 Type 2 diabetes mellitus without complications: Secondary | ICD-10-CM | POA: Diagnosis not present

## 2018-09-21 DIAGNOSIS — I110 Hypertensive heart disease with heart failure: Secondary | ICD-10-CM | POA: Diagnosis not present

## 2018-09-21 DIAGNOSIS — I639 Cerebral infarction, unspecified: Secondary | ICD-10-CM | POA: Diagnosis present

## 2018-09-21 DIAGNOSIS — I420 Dilated cardiomyopathy: Secondary | ICD-10-CM | POA: Diagnosis not present

## 2018-09-21 DIAGNOSIS — I69319 Unspecified symptoms and signs involving cognitive functions following cerebral infarction: Secondary | ICD-10-CM

## 2018-09-21 DIAGNOSIS — R1011 Right upper quadrant pain: Secondary | ICD-10-CM | POA: Diagnosis not present

## 2018-09-21 DIAGNOSIS — R000 Tachycardia, unspecified: Secondary | ICD-10-CM

## 2018-09-21 LAB — COMPREHENSIVE METABOLIC PANEL
ALT: 85 U/L — ABNORMAL HIGH (ref 0–44)
AST: 102 U/L — AB (ref 15–41)
Albumin: 4 g/dL (ref 3.5–5.0)
Alkaline Phosphatase: 65 U/L (ref 38–126)
Anion gap: 11 (ref 5–15)
BUN: 13 mg/dL (ref 8–23)
CO2: 26 mmol/L (ref 22–32)
Calcium: 9.6 mg/dL (ref 8.9–10.3)
Chloride: 101 mmol/L (ref 98–111)
Creatinine, Ser: 0.85 mg/dL (ref 0.44–1.00)
GFR calc Af Amer: >60 mL/min (ref >60)
Glucose, Bld: 197 mg/dL — ABNORMAL HIGH (ref 70–99)
POTASSIUM: 3.8 mmol/L (ref 3.5–5.1)
Sodium: 138 mmol/L (ref 135–145)
Total Bilirubin: 1.5 mg/dL — ABNORMAL HIGH (ref 0.3–1.2)
Total Protein: 7.7 g/dL (ref 6.5–8.1)

## 2018-09-21 LAB — CBC
HEMATOCRIT: 45.2 % (ref 36.0–46.0)
HEMOGLOBIN: 14.2 g/dL (ref 12.0–15.0)
MCH: 21.5 pg — ABNORMAL LOW (ref 26.0–34.0)
MCHC: 31.4 g/dL (ref 30.0–36.0)
MCV: 68.3 fL — ABNORMAL LOW (ref 80.0–100.0)
NRBC: 0 % (ref 0.0–0.2)
Platelets: 279 10*3/uL (ref 150–400)
RBC: 6.62 MIL/uL — ABNORMAL HIGH (ref 3.87–5.11)
RDW: 16.8 % — ABNORMAL HIGH (ref 11.5–15.5)
WBC: 7.7 10*3/uL (ref 4.0–10.5)

## 2018-09-21 LAB — URINALYSIS, ROUTINE W REFLEX MICROSCOPIC
Bilirubin Urine: NEGATIVE
Glucose, UA: 50 mg/dL — AB
HGB URINE DIPSTICK: NEGATIVE
Ketones, ur: 5 mg/dL — AB
LEUKOCYTES UA: NEGATIVE
NITRITE: NEGATIVE
Specific Gravity, Urine: 1.022 (ref 1.005–1.030)
pH: 5 (ref 5.0–8.0)

## 2018-09-21 LAB — I-STAT CG4 LACTIC ACID, ED
LACTIC ACID, VENOUS: 2.49 mmol/L — AB (ref 0.5–1.9)
Lactic Acid, Venous: 1.7 mmol/L (ref 0.5–1.9)

## 2018-09-21 LAB — LIPASE, BLOOD: Lipase: 30 U/L (ref 11–51)

## 2018-09-21 MED ORDER — SODIUM CHLORIDE 0.9 % IV BOLUS
500.0000 mL | Freq: Once | INTRAVENOUS | Status: AC
  Administered 2018-09-21: 500 mL via INTRAVENOUS

## 2018-09-21 MED ORDER — CARVEDILOL 12.5 MG PO TABS
12.5000 mg | ORAL_TABLET | Freq: Once | ORAL | Status: AC
  Administered 2018-09-21: 12.5 mg via ORAL
  Filled 2018-09-21: qty 1

## 2018-09-21 MED ORDER — METOCLOPRAMIDE HCL 5 MG/ML IJ SOLN
10.0000 mg | INTRAMUSCULAR | Status: AC
  Administered 2018-09-21: 10 mg via INTRAVENOUS
  Filled 2018-09-21: qty 2

## 2018-09-21 MED ORDER — ONDANSETRON 4 MG PO TBDP
4.0000 mg | ORAL_TABLET | Freq: Once | ORAL | Status: AC | PRN
  Administered 2018-09-21: 4 mg via ORAL
  Filled 2018-09-21: qty 1

## 2018-09-21 MED ORDER — HALOPERIDOL LACTATE 5 MG/ML IJ SOLN
2.0000 mg | Freq: Once | INTRAMUSCULAR | Status: AC
  Administered 2018-09-21: 2 mg via INTRAVENOUS
  Filled 2018-09-21: qty 1

## 2018-09-21 NOTE — H&P (Signed)
History and Physical    Kristen Lozano EAV:[id number] DOB: 07-Jul-1956 DOA: 09/21/2018  Referring MD/NP/PA:   PCP: Audley Hose, MD   Patient coming from:  The patient is coming from home.  At baseline, pt is independent for most of ADL.  Chief Complaint: Abdominal pain  HPI: Kristen Lozano is a 62 y.o. female with medical history significant of hypertension, hyperlipidemia, diabetes mellitus, stroke, GERD, CHF with EF 20%, colitis, sickle cell trait, LV mural thrombus on Coumadin, who presents with abdominal pain.  Patient was recently hospitalized from 10/14-10/16 due to abdominal pain, nausea and vomiting.  Etiology was not clear. Per discharge summery, pt had multiple CT abdominal imaging recently with no acute findings, UA unremarkable, and quick resolution of symptoms with supportive care. GI was consulted and agreed with supportive care. They did not deem her a good endoscopic candidate given her multiple comorbidities and low yield of any useful findings.    Patient states that she continues to have abdominal pain, which is located in the lower and also right side of abdomen, constant, severe, nonradiating.  It is associated with nausea, vomiting.  She vomited twice day before yesterday, no once yesterday which was nonbloody and non-biliary vomitus.  Patient states she had one loose stool bowel movement today, but she is on laxatives.  No fever or chills. Patient does not have chest pain, shortness breath, cough.  No symptoms of UTI or unilateral weakness.  ED Course: pt was found to have WBC 7.7, lactic acid 2.49, 1.70, negative urinalysis, electrolytes renal function okay, abnormal liver function (ALP 65, AST 102, ALT 85, total bilirubin 1.5), temperature normal, sinus tachycardia, no tachypnea, oxygen saturation 100% on room air.  Abdominal ultrasound showed nonspecific gallbladder wall thickening, but no evidence of gallstones or sludge. Patient is placed on telemetry bed for  observation.  GI, Dr. Alessandra Bevels was consulted, see patient in the morning.  Review of Systems:    General: no fevers, chills, no body weight gain, has poor appetite, has fatigue HEENT: no blurry vision, hearing changes or sore throat Respiratory: no dyspnea, coughing, wheezing CV: no chest pain, no palpitations GI: has nausea, vomiting, abdominal pain, diarrhea, no constipation GU: no dysuria, burning on urination, increased urinary frequency, hematuria  Ext: no leg edema Neuro: no unilateral weakness, numbness, or tingling, no vision change or hearing loss Skin: no rash, no skin tear. MSK: No muscle spasm, no deformity, no limitation of range of movement in spin Heme: No easy bruising.  Travel history: No recent long distant travel.  Allergy: No Known Allergies  Past Medical History:  Diagnosis Date  . Allergic rhinitis    Requires cetirizine, singulair, and fluticasone.  . Anxiety    Has been on Xanax since 2009. Uses it for stress, anxiety, and insomnia. No contract yet.  . Arthritis   . CHF (congestive heart failure) (HCC)    EF 35% after stroke, presumed ischemic  . Chronic pain    Has OA of knees B. No Xrays in echart. Not requiring narcotics.  . Colitis 12/2017  . Depression   . Diabetes mellitus    Type 2, non insulin dependent. Was dx'd prior to 2008.  Marland Kitchen Hyperglycemia   . Hyperlipemia   . Hypertension   . Sickle cell trait ([place])   . 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 TEE WITHOUT CARDIOVERSION N/A 09/06/2014  Procedure: TRANSESOPHAGEAL ECHOCARDIOGRAM (TEE);  Surgeon: Pixie Casino, MD;  Location[facility] ENDOSCOPY;  Service: Cardiovascular;  Laterality: N/A;    Social History:  reports that she has never smoked. She has never used smokeless tobacco. She reports that she does not drink alcohol or use drugs.  Family History:  Family History  Problem  Relation Age of Onset  . Diabetes Mother   . Hypertension Mother   . Heart disease Father   . Heart attack Father   . Hypertension Father   . Diabetes Father   . Ovarian cancer Sister   . Liver cancer Sister   . Sickle cell anemia Daughter   . Schizophrenia Daughter   . Hypertension Sister   . Hypertension Brother   . Hypertension Daughter   . Kidney disease Sister        x2  . Kidney disease Brother   . Stroke Neg Hx   . Esophageal cancer Neg Hx   . Colon cancer Neg Hx   . Colon polyps Neg Hx      Prior to Admission medications   Medication Sig Start Date End Date Taking? Authorizing Provider  ASPIRIN LOW DOSE 81 MG EC tablet Take 1 tablet (81 mg total) by mouth daily. Patient taking differently: Take 81 mg by mouth daily.  01/22/18  Yes Hilty, Nadean Corwin, MD  atorvastatin (LIPITOR) 20 MG tablet Take 1 tablet (20 mg total) by mouth daily at 6 PM. Please schedule appointment for refills. 01/02/18  Yes Hilty, Nadean Corwin, MD  b complex vitamins tablet Take 1 tablet by mouth daily.   Yes [provider]  carvedilol (COREG) 12.5 MG tablet Take 12.5 mg by mouth 2 (two) times daily with a meal.  09/18/18  Yes [provider]  feeding supplement, ENSURE ENLIVE, (ENSURE ENLIVE) LIQD Take 237 mLs by mouth 3 (three) times daily between meals. 08/17/18  Yes Oretha Milch D, MD  furosemide (LASIX) 20 MG tablet Take 1 tablet (20 mg total) by mouth daily. 09/02/18 09/02/19 Yes Georgette Shell, MD  lisinopril (PRINIVIL,ZESTRIL) 2.5 MG tablet Take 2.5 mg by mouth daily.  09/18/18  Yes [provider]  metFORMIN (GLUCOPHAGE-XR) 500 MG 24 hr tablet Take 1,000 mg by mouth 2 (two) times daily.  08/06/18  Yes [provider]  Multiple Vitamins-Minerals (MULTIVITAMIN ADULT) TABS Take 1 tablet by mouth daily.   Yes [provider]  potassium chloride (K-DUR) 10 MEQ tablet Take 1 tablet (10 mEq total) by mouth daily. 09/02/18  Yes Georgette Shell, MD    warfarin (COUMADIN) 5 MG tablet Tale 1.5 Tablets (7.95m)daily OR as directed by coumadin clinic Patient taking differently: Take 5-7.5 mg by mouth See admin instructions. Take 1 1/2 tablets (7.5 mg) on Monday and Friday evening, take 1 tablet (5 mg) on Sunday, Tuesday, Wednesday, Thursday, Saturday evening or as directed by coumadin clinic 05/29/18  Yes Hilty, KNadean Corwin MD  docusate sodium (COLACE) 100 MG capsule Take 1 capsule (100 mg total) by mouth 2 (two) times daily. Patient taking differently: Take 100 mg by mouth 2 (two) times daily as needed for mild constipation.  09/09/18   NDesiree Hane MD  Dulaglutide (TRULICITY) 08.00MLK/9.1PHSOPN Inject 0.75 mg into the skin every Saturday.    [provider]  Metoprolol Tartrate 75 MG TABS Take 75 mg by mouth 2 (two) times daily. Patient not taking: Reported on 09/21/2018 09/09/18   NDesiree Hane MD  ondansetron (ZOFRAN) 4 MG tablet Take  1 tablet (4 mg total) by mouth every 6 (six) hours as needed for nausea. 09/02/18   Georgette Shell, MD  pantoprazole (PROTONIX) 40 MG tablet Take 1 tablet (40 mg total) by mouth daily. 08/17/18   Oretha Milch D, MD  polyethylene glycol (MIRALAX / GLYCOLAX) packet Take 17 g by mouth daily as needed. Patient taking differently: Take 17 g by mouth daily as needed for mild constipation or moderate constipation.  09/09/18   Oretha Milch D, MD  spironolactone (ALDACTONE) 25 MG tablet Take 1 tablet (25 mg total) by mouth daily. Patient not taking: Reported on 09/21/2018 09/09/18 09/09/19  Desiree Hane, MD    Physical Exam: Vitals:   09/22/18 0030 09/22/18 0102 09/22/18 0105 09/22/18 0657  BP: (!) 127/107  (!) 144/89 (!) 147/100  Pulse: 65  (!) 122 (!) 120  Resp: 18  20 16   Temp:   98 F (36.7 C) (!) 97.4 F (36.3 C)  TempSrc:   Oral Oral  SpO2: 97%  99% 98%  Weight:  50.3 kg    Height:  5' 3"  (1.6 m)     General: Not in acute distress HEENT:       Eyes: PERRL, EOMI, no scleral  icterus.       ENT: No discharge from the ears and nose, no pharynx injection, no tonsillar enlargement.        Neck: No JVD, no bruit, no mass felt. Heme: No neck lymph node enlargement. Cardiac: S1/S2, RRR, No murmurs, No gallops or rubs. Respiratory: No rales, wheezing, rhonchi or rubs. GI: Soft, nondistended, has tenderness in right and lower abdomen, no rebound pain, no organomegaly, BS present. GU: No hematuria Ext: No pitting leg edema bilaterally. 2+DP/PT pulse bilaterally. Musculoskeletal: No joint deformities, No joint redness or warmth, no limitation of ROM in spin. Skin: No rashes.  Neuro: Alert, oriented X3, cranial nerves II-XII grossly intact, moves all extremities normally.  Psych: Patient is not psychotic, no suicidal or hemocidal ideation.  Labs on Admission: I have personally reviewed following labs and imaging studies  CBC: Recent Labs  Lab 09/21/18 1836 09/22/18 0136  WBC 7.7 8.9  HGB 14.2 12.2  HCT 45.2 39.0  MCV 68.3* 69.1*  PLT 279 622   Basic Metabolic Panel: Recent Labs  Lab 09/21/18 1836 09/22/18 0136  NA 138 137  K 3.8 4.2  CL 101 105  CO2 26 23  GLUCOSE 197* 180*  BUN 13 13  CREATININE 0.85 0.78  CALCIUM 9.6 8.8*   GFR: Estimated Creatinine Clearance: 57.2 mL/min (by C-G formula based on SCr of 0.78 mg/dL). Liver Function Tests: Recent Labs  Lab 09/21/18 1836 09/22/18 0136  AST 102* 70*  ALT 85* 67*  ALKPHOS 65 51  BILITOT 1.5* 1.6*  PROT 7.7 6.5  ALBUMIN 4.0 3.3*   Recent Labs  Lab 09/21/18 1836  LIPASE 30   No results for input(s): AMMONIA in the last 168 hours. Coagulation Profile: Recent Labs  Lab 09/22/18 0136  INR 1.78   Cardiac Enzymes: No results for input(s): CKTOTAL, CKMB, CKMBINDEX, TROPONINI in the last 168 hours. BNP (last 3 results) No results for input(s): PROBNP in the last 8760 hours. HbA1C: No results for input(s): HGBA1C in the last 72 hours. CBG: Recent Labs  Lab 09/22/18 0033  GLUCAP 180*    Lipid Profile: No results for input(s): CHOL, HDL, LDLCALC, TRIG, CHOLHDL, LDLDIRECT in the last 72 hours. Thyroid Function Tests: No results for input(s): TSH, T4TOTAL, FREET4, T3FREE, THYROIDAB  in the last 72 hours. Anemia Panel: No results for input(s): VITAMINB12, FOLATE, FERRITIN, TIBC, IRON, RETICCTPCT in the last 72 hours. Urine analysis:    Component Value Date/Time   COLORURINE YELLOW 09/21/2018 1847   APPEARANCEUR HAZY (A) 09/21/2018 1847   LABSPEC 1.022 09/21/2018 1847   PHURINE 5.0 09/21/2018 1847   GLUCOSEU 50 (A) 09/21/2018 1847   HGBUR NEGATIVE 09/21/2018 1847   BILIRUBINUR NEGATIVE 09/21/2018 1847   KETONESUR 5 (A) 09/21/2018 1847   PROTEINUR >=300 (A) 09/21/2018 1847   UROBILINOGEN 1.0 09/07/2014 1405   NITRITE NEGATIVE 09/21/2018 1847   LEUKOCYTESUR NEGATIVE 09/21/2018 1847   Sepsis Labs: @LABRCNTIP (procalcitonin:4,lacticidven:4) )No results found for this or any previous visit (from the past 240 hour(s)).   Radiological Exams on Admission: US Abdomen Limited  Result Date: 09/21/2018 CLINICAL DATA:  Abdominal pain with vomiting and elevated LFTs as well as elevated bilirubin. EXAM: ULTRASOUND ABDOMEN LIMITED RIGHT UPPER QUADRANT COMPARISON:  CT 09/01/2018 FINDINGS: Gallbladder: No evidence of gallstones or sludge. Mild wall thickening measuring 5.7 mm. Possible tiny amount of pericholecystic fluid. Common bile duct: Diameter: 3.1 mm. Liver: No focal lesion identified. Within normal limits in parenchymal echogenicity. Portal vein is patent on color Doppler imaging with normal direction of blood flow towards the liver. Right pleural effusion. IMPRESSION: Nonspecific gallbladder wall thickening. No evidence of gallstones or sludge. HIDA scan may be helpful to assess gallbladder function. Normal liver and biliary tree. Right pleural effusion. Electronically Signed   By: Marin Olp M.D.   On: 09/21/2018 22:20     EKG: Independently reviewed.  Sinus rhythm,  tachycardia, Xanax deviation, QTC 424, nonspecific T wave change.  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 Pt has abnormal liver function (ALP 65, AST 102, ALT 85, total bilirubin 1.5). Abdominal ultrasound showed nonspecific gallbladder wall thickening, but no evidence of gallstones or sludge. GI, Dr. Alessandra Bevels was consulted, will see patient in the morning.  -will place on tele bed for obs -will get HIDA scan -PRN morphine for pain, Zofran for nausea. -IV fluid: Normal saline 500 cc in ED  HTN:  -Continue home medications: Coreg, lisinopril, -Also on Lasix for CHF -IV hydralazine prn  Cerebral infarction [facility]): -Continue Lipitor, aspirin  Cognitive deficit due to recent stroke: Patient is oriented x3.  No behavior disturbance. -Observation  LV (left ventricular) mural thrombus following MI [facility]): -Continue Coumadin -Check INR, goal of INR is 2-3  Chronic combined systolic and diastolic CHF (congestive heart failure) ([place]): 2D echo on 08/10/2018 showed EF 20-25%.  Patient does not have leg edema or respiratory distress.  CHF seems to be compensated. -Check BNP -Continue Coreg and aspirin  Type 2 diabetes mellitus with vascular disease ([place]): Last A1c 7.4 on 09/08/2018, poorly controled. Patient is taking Trulicity and metformin at home -SSI  Sinus tachycardia: This seems to be a chronic issue.  Current heart rates in the 120s.  No chest pain. -Continue Coreg  HLD (hyperlipidemia): -Lipitor  GERD: -Protonix     DVT ppx: On  Coumadin Code Status: Full code Family Communication: None at bed side.  Disposition Plan:  Anticipate discharge back to previous home environment Consults called: GI, Dr. Alessandra Bevels Admission status: Obs / tele     Date of Service 09/22/2018    Ivor Costa [facility] Pager [phone]  If 7PM-7AM, please contact night-coverage [email or web address] Password [email or web address] 09/22/2018, 7:13 AM

## 2018-09-21 NOTE — ED Notes (Signed)
Pt reports last BM today. Pt observed vomiting yellow emesis moderate amount.

## 2018-09-21 NOTE — ED Notes (Signed)
Pt reports that she has had a cough the past few days.

## 2018-09-21 NOTE — ED Provider Notes (Signed)
[facility] DEPT Provider Note   CSN: [id number] Arrival date & time: 09/21/18  1630     History   Chief Complaint Chief Complaint  Patient presents with  . Abdominal Pain  . Emesis    HPI Kristen Lozano is a 62 y.o. female.  62 year old female with extensive past medical history including CHF, CVA, type 2 diabetes mellitus, hypertension, hyperlipidemia, anxiety/depression who presents with nausea, vomiting, and abdominal pain.  Patient began having generalized abdominal pain associated with recurrent episodes of nausea and vomiting yesterday morning.  Symptoms have persisted throughout today.  She was admitted to the hospital earlier this month for similar symptoms, husband states that today's presentation seems similar.  She denies any diarrhea or blood in her stools.  No fevers, urinary symptoms, or change in medications.  She saw a gastroenterologist over 5 years ago but has not followed with anyone recently in the clinic.  She denies alcohol or drug use.  The history is provided by the patient and the spouse.    Past Medical History:  Diagnosis Date  . Allergic rhinitis    Requires cetirizine, singulair, and fluticasone.  . Anxiety    Has been on Xanax since 2009. Uses it for stress, anxiety, and insomnia. No contract yet.  . Arthritis   . CHF (congestive heart failure) (HCC)    EF 35% after stroke, presumed ischemic  . Chronic pain    Has OA of knees B. No Xrays in echart. Not requiring narcotics.  . Colitis 12/2017  . Depression   . Diabetes mellitus    Type 2, non insulin dependent. Was dx'd prior to 2008.  Marland Kitchen Hyperglycemia   . Hyperlipemia   . Hypertension   . Sickle cell trait ([place])   . Stroke [facility]) 09/05/14   Dominant left MCA infarcts secondary to unknown embolic source     Patient Active Problem List   Diagnosis Date Noted  . Nausea and vomiting 09/08/2018  . Nausea with vomiting 09/07/2018  . Dyspnea   . Hypomagnesemia     . Sinus tachycardia   . Malnutrition of moderate degree 08/13/2018  . Enteritis   . Pleural effusion   . Nausea 08/11/2018  . Acute systolic CHF (congestive heart failure), NYHA class 3 ([place])   . Dilated cardiomyopathy ([place])   . Nausea & vomiting 08/09/2018  . Type 2 diabetes mellitus with vascular disease ([place]) 08/09/2018  . Abdominal pain 08/09/2018  . Infectious colitis 01/11/2018  . Hypokalemia 01/11/2018  . Uncontrolled type 2 diabetes mellitus with hyperglycemia ([place])   . Chronic combined systolic and diastolic CHF (congestive heart failure) ([place]) 12/16/2016  . Monitoring for long-term anticoagulant use 11/14/2016  . DKA (diabetic ketoacidoses) ([place]) 11/01/2016  . Diabetes mellitus 11/01/2016  . Elevated troponin   . Cardiomyopathy, ischemic   . 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 Cognitive deficit due to recent stroke 10/18/2014  . History of completed stroke 10/18/2014  . Abnormal stress test 10/17/2014  . Dysphagia, pharyngoesophageal phase 09/23/2014  . Abnormal CT scan 09/23/2014  . Dilated bile duct 09/23/2014  . Urinary retention 09/07/2014  . 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 Stroke ([place]) 09/05/2014  . Cerebral infarction ([place]) 09/05/2014  . Non compliance w medication regimen 08/10/2012  . Fatigue 02/18/2012  . Essential hypertension 03/22/2011  . Healthcare maintenance 03/22/2011  . Chronic pain   . ALLERGIC RHINITIS, SEASONAL 03/14/2008  . SICKLE CELL TRAIT 10/31/2006  . Anxiety state 10/31/2006  .  DEPRESSION 10/31/2006    Past Surgical History:  Procedure Laterality Date  . TEE WITHOUT CARDIOVERSION N/A 09/06/2014   Procedure: TRANSESOPHAGEAL ECHOCARDIOGRAM (TEE);  Surgeon: Pixie Casino, MD;  Location: [facility] ENDOSCOPY;  Service: Cardiovascular;  Laterality: N/A;     OB History   None      Home Medications    Prior to Admission medications   Medication Sig Start Date End Date Taking? Authorizing Provider   ASPIRIN LOW DOSE 81 MG EC tablet Take 1 tablet (81 mg total) by mouth daily. Patient taking differently: Take 81 mg by mouth daily.  01/22/18  Yes Hilty, Nadean Corwin, MD  atorvastatin (LIPITOR) 20 MG tablet Take 1 tablet (20 mg total) by mouth daily at 6 PM. Please schedule appointment for refills. 01/02/18  Yes Hilty, Nadean Corwin, MD  b complex vitamins tablet Take 1 tablet by mouth daily.   Yes [provider]  carvedilol (COREG) 12.5 MG tablet Take 12.5 mg by mouth 2 (two) times daily with a meal.  09/18/18  Yes [provider]  feeding supplement, ENSURE ENLIVE, (ENSURE ENLIVE) LIQD Take 237 mLs by mouth 3 (three) times daily between meals. 08/17/18  Yes Oretha Milch D, MD  furosemide (LASIX) 20 MG tablet Take 1 tablet (20 mg total) by mouth daily. 09/02/18 09/02/19 Yes Georgette Shell, MD  lisinopril (PRINIVIL,ZESTRIL) 2.5 MG tablet Take 2.5 mg by mouth daily.  09/18/18  Yes [provider]  metFORMIN (GLUCOPHAGE-XR) 500 MG 24 hr tablet Take 1,000 mg by mouth 2 (two) times daily.  08/06/18  Yes [provider]  Multiple Vitamins-Minerals (MULTIVITAMIN ADULT) TABS Take 1 tablet by mouth daily.   Yes [provider]  potassium chloride (K-DUR) 10 MEQ tablet Take 1 tablet (10 mEq total) by mouth daily. 09/02/18  Yes Georgette Shell, MD  warfarin (COUMADIN) 5 MG tablet Tale 1.5 Tablets (7.46m)daily OR as directed by coumadin clinic Patient taking differently: Take 5-7.5 mg by mouth See admin instructions. Take 1 1/2 tablets (7.5 mg) on Monday and Friday evening, take 1 tablet (5 mg) on Sunday, Tuesday, Wednesday, Thursday, Saturday evening or as directed by coumadin clinic 05/29/18  Yes Hilty, KNadean Corwin MD  docusate sodium (COLACE) 100 MG capsule Take 1 capsule (100 mg total) by mouth 2 (two) times daily. Patient taking differently: Take 100 mg by mouth 2 (two) times daily as needed for mild constipation.  09/09/18   NDesiree Hane MD  Dulaglutide  (TRULICITY) 08.93MTD/4.2AJSOPN Inject 0.75 mg into the skin every Saturday.    [provider]  Metoprolol Tartrate 75 MG TABS Take 75 mg by mouth 2 (two) times daily. Patient not taking: Reported on 09/21/2018 09/09/18   NOretha MilchD, MD  ondansetron (ZOFRAN) 4 MG tablet Take 1 tablet (4 mg total) by mouth every 6 (six) hours as needed for nausea. 09/02/18   MGeorgette Shell MD  pantoprazole (PROTONIX) 40 MG tablet Take 1 tablet (40 mg total) by mouth daily. 08/17/18   NOretha MilchD, MD  polyethylene glycol (MIRALAX / GLYCOLAX) packet Take 17 g by mouth daily as needed. Patient taking differently: Take 17 g by mouth daily as needed for mild constipation or moderate constipation.  09/09/18   NOretha MilchD, MD  spironolactone (ALDACTONE) 25 MG tablet Take 1 tablet (25 mg total) by mouth daily. Patient not taking: Reported on 09/21/2018 09/09/18 09/09/19  NDesiree Hane MD    Family History Family History  Problem Relation Age  of Onset  . Diabetes Mother   . Hypertension Mother   . Heart disease Father   . Heart attack Father   . Hypertension Father   . Diabetes Father   . Ovarian cancer Sister   . Liver cancer Sister   . Sickle cell anemia Daughter   . Schizophrenia Daughter   . Hypertension Sister   . Hypertension Brother   . Hypertension Daughter   . Kidney disease Sister        x2  . Kidney disease Brother   . Stroke Neg Hx   . Esophageal cancer Neg Hx   . Colon cancer Neg Hx   . Colon polyps Neg Hx     Social History Social History   Tobacco Use  . Smoking status: Never Smoker  . Smokeless tobacco: Never Used  Substance Use Topics  . Alcohol use: No    Alcohol/week: 0.0 standard drinks  . Drug use: No     Allergies   Patient has no known allergies.   Review of Systems Review of Systems All other systems reviewed and are negative except that which was mentioned in HPI   Physical Exam Updated Vital Signs BP (!) 142/100   Pulse (!)  210   Temp 98.9 F (37.2 C) (Oral)   Resp 17   Ht 5' 3"  (1.6 m)   Wt 51.7 kg   SpO2 100%   BMI 20.19 kg/m   Physical Exam  Constitutional: She is oriented to person, place, and time. She appears well-developed and well-nourished.  Non-toxic appearance.  Thin, frail, chronically ill-appearing, uncomfortable  HENT:  Head: Normocephalic and atraumatic.  dry mucous membranes  Eyes: Conjunctivae are normal.  Neck: Neck supple.  Cardiovascular: Regular rhythm and normal heart sounds. Tachycardia present.  No murmur heard. Pulmonary/Chest: Effort normal and breath sounds normal.  Abdominal: Soft. Bowel sounds are normal. She exhibits no distension. There is no tenderness. There is no rebound, no guarding and negative Murphy's sign.  Musculoskeletal: She exhibits no edema.  Neurological: She is alert and oriented to person, place, and time.  Fluent speech  Skin: Skin is warm and dry.  Psychiatric: She has a normal mood and affect. Judgment normal.  Nursing note and vitals reviewed.    ED Treatments / Results  Labs (all labs ordered are listed, but only abnormal results are displayed) Labs Reviewed  COMPREHENSIVE METABOLIC PANEL - Abnormal; Notable for the following components:      Result Value   Glucose, Bld 197 (*)    AST 102 (*)    ALT 85 (*)    Total Bilirubin 1.5 (*)    All other components within normal limits  CBC - Abnormal; Notable for the following components:   RBC 6.62 (*)    MCV 68.3 (*)    MCH 21.5 (*)    RDW 16.8 (*)    All other components within normal limits  URINALYSIS, ROUTINE W REFLEX MICROSCOPIC - Abnormal; Notable for the following components:   APPearance HAZY (*)    Glucose, UA 50 (*)    Ketones, ur 5 (*)    Protein, ur >=300 (*)    Bacteria, UA RARE (*)    All other components within normal limits  I-STAT CG4 LACTIC ACID, ED - Abnormal; Notable for the following components:   Lactic Acid, Venous 2.49 (*)    All other components within normal  limits  LIPASE, BLOOD  I-STAT CG4 LACTIC ACID, ED    EKG EKG Interpretation  Date/Time:  Monday September 21 2018 17:08:38 EDT Ventricular Rate:  130 PR Interval:    QRS Duration: 83 QT Interval:  288 QTC Calculation: 424 R Axis:   103 Text Interpretation:  Sinus tachycardia Probable left atrial enlargement Right axis deviation Consider left ventricular hypertrophy similar to previous Confirmed by Theotis Burrow [phone]) on 09/21/2018 6:33:23 PM   Radiology US Abdomen Limited  Result Date: 09/21/2018 CLINICAL DATA:  Abdominal pain with vomiting and elevated LFTs as well as elevated bilirubin. EXAM: ULTRASOUND ABDOMEN LIMITED RIGHT UPPER QUADRANT COMPARISON:  CT 09/01/2018 FINDINGS: Gallbladder: No evidence of gallstones or sludge. Mild wall thickening measuring 5.7 mm. Possible tiny amount of pericholecystic fluid. Common bile duct: Diameter: 3.1 mm. Liver: No focal lesion identified. Within normal limits in parenchymal echogenicity. Portal vein is patent on color Doppler imaging with normal direction of blood flow towards the liver. Right pleural effusion. IMPRESSION: Nonspecific gallbladder wall thickening. No evidence of gallstones or sludge. HIDA scan may be helpful to assess gallbladder function. Normal liver and biliary tree. Right pleural effusion. Electronically Signed   By: Marin Olp M.D.   On: 09/21/2018 22:20    Procedures Procedures (including critical care time)  Medications Ordered in ED Medications  ondansetron (ZOFRAN-ODT) disintegrating tablet 4 mg (4 mg Oral Given 09/21/18 1719)  metoCLOPramide (REGLAN) injection 10 mg (10 mg Intravenous Given 09/21/18 1847)  haloperidol lactate (HALDOL) injection 2 mg (2 mg Intravenous Given 09/21/18 1931)  sodium chloride 0.9 % bolus 500 mL (0 mLs Intravenous Stopped 09/21/18 2042)  carvedilol (COREG) tablet 12.5 mg (12.5 mg Oral Given 09/21/18 2319)     Initial Impression / Assessment and Plan / ED Course  I have reviewed  the triage vital signs and the nursing notes.  Pertinent labs & imaging results that were available during my care of the patient were reviewed by me and considered in my medical decision making (see chart for details).    She was afebrile on exam, tachycardic.  I reviewed recent hospitalization which notes same problem with tachycardia, patient notes she has vomited her carvedilol today.  No focal right upper quadrant abdominal tenderness.  Initial lactate 2.5, corrected to normal with fluids.  UA without evidence of infection.  Labs show glucose 197, normal creatinine, normal BUN, AST 102, ALT 85, bilirubin 1.5.  LFTs are mildly elevated compared to previous.  WBC normal.  Given lab abnormalities, obtain right upper quadrant ultrasound which showed nonspecific gallbladder wall thickening without gallstones or sludge.  I am concerned about this finding combined with abnormal lab work in the setting of recurrent problems with vomiting and abdominal pain.  It appears that she was seen by GI at previous hospitalization but deemed poor endoscopy candidate. Chart review shows no recent studies of gallbladder. Discussed w/ GI, Dr. Alessandra Bevels, who agreed that findings may suggest pathology and she would likely benefit from inpatient work up due to high risk for bounce-back. Discussed w/ [facility], Dr. Blaine Hamper, and pt admitted for further care. Final Clinical Impressions(s) / ED Diagnoses   Final diagnoses:  Abdominal pain, unspecified abdominal location  Nausea and vomiting, intractability of vomiting not specified, unspecified vomiting type    ED Discharge Orders    None       Barbar Brede, Wenda Overland, MD 09/22/18 0020

## 2018-09-21 NOTE — ED Notes (Signed)
Graham crackers and sprite provided to husband

## 2018-09-21 NOTE — ED Triage Notes (Signed)
Patient c/o generalized abdominal pain and vomiting since yesterday. Patient's ex husband states the patient was at her PCP 3 days ago and was told to increase oral intake,but has not done so.

## 2018-09-22 ENCOUNTER — Observation Stay (HOSPITAL_COMMUNITY): Payer: Medicare HMO

## 2018-09-22 DIAGNOSIS — R112 Nausea with vomiting, unspecified: Secondary | ICD-10-CM | POA: Diagnosis not present

## 2018-09-22 DIAGNOSIS — K2970 Gastritis, unspecified, without bleeding: Secondary | ICD-10-CM

## 2018-09-22 DIAGNOSIS — R1084 Generalized abdominal pain: Secondary | ICD-10-CM | POA: Diagnosis not present

## 2018-09-22 DIAGNOSIS — R748 Abnormal levels of other serum enzymes: Secondary | ICD-10-CM

## 2018-09-22 DIAGNOSIS — R1010 Upper abdominal pain, unspecified: Secondary | ICD-10-CM | POA: Diagnosis not present

## 2018-09-22 DIAGNOSIS — K219 Gastro-esophageal reflux disease without esophagitis: Secondary | ICD-10-CM | POA: Diagnosis not present

## 2018-09-22 DIAGNOSIS — R1011 Right upper quadrant pain: Secondary | ICD-10-CM | POA: Diagnosis not present

## 2018-09-22 LAB — HEPATIC FUNCTION PANEL
ALT: 66 U/L — ABNORMAL HIGH (ref 0–44)
AST: 57 U/L — AB (ref 15–41)
Albumin: 3.5 g/dL (ref 3.5–5.0)
Alkaline Phosphatase: 51 U/L (ref 38–126)
Bilirubin, Direct: 0.4 mg/dL — ABNORMAL HIGH (ref 0.0–0.2)
Indirect Bilirubin: 1.1 mg/dL — ABNORMAL HIGH (ref 0.3–0.9)
TOTAL PROTEIN: 6.7 g/dL (ref 6.5–8.1)
Total Bilirubin: 1.5 mg/dL — ABNORMAL HIGH (ref 0.3–1.2)

## 2018-09-22 LAB — CBC
HCT: 39 % (ref 36.0–46.0)
Hemoglobin: 12.2 g/dL (ref 12.0–15.0)
MCH: 21.6 pg — AB (ref 26.0–34.0)
MCHC: 31.3 g/dL (ref 30.0–36.0)
MCV: 69.1 fL — ABNORMAL LOW (ref 80.0–100.0)
NRBC: 0 % (ref 0.0–0.2)
Platelets: 249 10*3/uL (ref 150–400)
RBC: 5.64 MIL/uL — ABNORMAL HIGH (ref 3.87–5.11)
RDW: 15.5 % (ref 11.5–15.5)
WBC: 8.9 10*3/uL (ref 4.0–10.5)

## 2018-09-22 LAB — COMPREHENSIVE METABOLIC PANEL
ALBUMIN: 3.3 g/dL — AB (ref 3.5–5.0)
ALT: 67 U/L — AB (ref 0–44)
AST: 70 U/L — AB (ref 15–41)
Alkaline Phosphatase: 51 U/L (ref 38–126)
Anion gap: 9 (ref 5–15)
BUN: 13 mg/dL (ref 8–23)
CHLORIDE: 105 mmol/L (ref 98–111)
CO2: 23 mmol/L (ref 22–32)
CREATININE: 0.78 mg/dL (ref 0.44–1.00)
Calcium: 8.8 mg/dL — ABNORMAL LOW (ref 8.9–10.3)
GFR calc non Af Amer: >60 mL/min (ref >60)
Glucose, Bld: 180 mg/dL — ABNORMAL HIGH (ref 70–99)
Potassium: 4.2 mmol/L (ref 3.5–5.1)
Sodium: 137 mmol/L (ref 135–145)
Total Bilirubin: 1.6 mg/dL — ABNORMAL HIGH (ref 0.3–1.2)
Total Protein: 6.5 g/dL (ref 6.5–8.1)

## 2018-09-22 LAB — PROTIME-INR
INR: 1.78
PROTHROMBIN TIME: 20.5 s — AB (ref 11.4–15.2)

## 2018-09-22 LAB — CK: Total CK: 112 U/L (ref 38–234)

## 2018-09-22 LAB — GLUCOSE, CAPILLARY
GLUCOSE-CAPILLARY: 199 mg/dL — AB (ref 70–99)
GLUCOSE-CAPILLARY: 141 mg/dL — AB (ref 70–99)
GLUCOSE-CAPILLARY: 155 mg/dL — AB (ref 70–99)
GLUCOSE-CAPILLARY: 193 mg/dL — AB (ref 70–99)
Glucose-Capillary: 166 mg/dL — ABNORMAL HIGH (ref 70–99)

## 2018-09-22 LAB — CBG MONITORING, ED: Glucose-Capillary: 180 mg/dL — ABNORMAL HIGH (ref 70–99)

## 2018-09-22 LAB — BRAIN NATRIURETIC PEPTIDE: B Natriuretic Peptide: 1495.9 pg/mL — ABNORMAL HIGH (ref 0.0–100.0)

## 2018-09-22 MED ORDER — WARFARIN SODIUM 5 MG PO TABS
5.0000 mg | ORAL_TABLET | Freq: Once | ORAL | Status: AC
  Administered 2018-09-22: 5 mg via ORAL
  Filled 2018-09-22: qty 1

## 2018-09-22 MED ORDER — CHLORHEXIDINE GLUCONATE 0.12 % MT SOLN
15.0000 mL | Freq: Two times a day (BID) | OROMUCOSAL | Status: DC
  Administered 2018-09-23 – 2018-09-25 (×5): 15 mL via OROMUCOSAL
  Filled 2018-09-22 (×7): qty 15

## 2018-09-22 MED ORDER — ZOLPIDEM TARTRATE 5 MG PO TABS: 5.0000 mg | ORAL_TABLET | Freq: Every evening | ORAL | Status: DC | PRN

## 2018-09-22 MED ORDER — B COMPLEX-C PO TABS
1.0000 | ORAL_TABLET | Freq: Every day | ORAL | Status: DC
  Administered 2018-09-22 – 2018-09-25 (×4): 1 via ORAL
  Filled 2018-09-22 (×4): qty 1

## 2018-09-22 MED ORDER — PANTOPRAZOLE SODIUM 40 MG PO TBEC: 40.0000 mg | DELAYED_RELEASE_TABLET | Freq: Two times a day (BID) | ORAL | Status: DC

## 2018-09-22 MED ORDER — MORPHINE SULFATE (PF) 2 MG/ML IV SOLN: 2.0000 mg | INTRAVENOUS | Status: DC | PRN

## 2018-09-22 MED ORDER — CARVEDILOL 12.5 MG PO TABS
12.5000 mg | ORAL_TABLET | Freq: Two times a day (BID) | ORAL | Status: DC
  Administered 2018-09-22 – 2018-09-25 (×6): 12.5 mg via ORAL
  Filled 2018-09-22 (×7): qty 1

## 2018-09-22 MED ORDER — INSULIN ASPART 100 UNIT/ML ~~LOC~~ SOLN
0.0000 [IU] | Freq: Three times a day (TID) | SUBCUTANEOUS | Status: DC
  Administered 2018-09-22 (×3): 2 [IU] via SUBCUTANEOUS
  Administered 2018-09-23: 3 [IU] via SUBCUTANEOUS
  Administered 2018-09-23: 2 [IU] via SUBCUTANEOUS
  Administered 2018-09-24: 1 [IU] via SUBCUTANEOUS
  Administered 2018-09-24 – 2018-09-25 (×3): 2 [IU] via SUBCUTANEOUS
  Administered 2018-09-25: 3 [IU] via SUBCUTANEOUS

## 2018-09-22 MED ORDER — PNEUMOCOCCAL VAC POLYVALENT 25 MCG/0.5ML IJ INJ
0.5000 mL | INJECTION | INTRAMUSCULAR | Status: DC
  Filled 2018-09-22: qty 0.5

## 2018-09-22 MED ORDER — ONDANSETRON HCL 4 MG/2ML IJ SOLN
4.0000 mg | Freq: Three times a day (TID) | INTRAMUSCULAR | Status: DC | PRN
  Administered 2018-09-22 (×2): 4 mg via INTRAVENOUS
  Filled 2018-09-22 (×2): qty 2

## 2018-09-22 MED ORDER — FAMOTIDINE IN NACL 20-0.9 MG/50ML-% IV SOLN
20.0000 mg | Freq: Two times a day (BID) | INTRAVENOUS | Status: DC
  Administered 2018-09-22 – 2018-09-23 (×2): 20 mg via INTRAVENOUS
  Filled 2018-09-22 (×3): qty 50

## 2018-09-22 MED ORDER — ENSURE ENLIVE PO LIQD
237.0000 mL | Freq: Three times a day (TID) | ORAL | Status: DC
  Administered 2018-09-23 – 2018-09-25 (×4): 237 mL via ORAL

## 2018-09-22 MED ORDER — DOCUSATE SODIUM 100 MG PO CAPS: 100.0000 mg | ORAL_CAPSULE | Freq: Every day | ORAL | Status: DC | PRN

## 2018-09-22 MED ORDER — LISINOPRIL 5 MG PO TABS
2.5000 mg | ORAL_TABLET | Freq: Every day | ORAL | Status: DC
  Administered 2018-09-22 – 2018-09-25 (×4): 2.5 mg via ORAL
  Filled 2018-09-22 (×5): qty 1

## 2018-09-22 MED ORDER — INSULIN ASPART 100 UNIT/ML ~~LOC~~ SOLN: 0.0000 [IU] | Freq: Every day | SUBCUTANEOUS | Status: DC

## 2018-09-22 MED ORDER — TECHNETIUM TC 99M MEBROFENIN IV KIT
5.3500 | PACK | Freq: Once | INTRAVENOUS | Status: AC | PRN
  Administered 2018-09-22: 5.35 via INTRAVENOUS

## 2018-09-22 MED ORDER — HYDRALAZINE HCL 20 MG/ML IJ SOLN: 5.0000 mg | INTRAMUSCULAR | Status: DC | PRN

## 2018-09-22 MED ORDER — ASPIRIN EC 81 MG PO TBEC
81.0000 mg | DELAYED_RELEASE_TABLET | Freq: Every day | ORAL | Status: DC
  Administered 2018-09-22 – 2018-09-25 (×4): 81 mg via ORAL
  Filled 2018-09-22 (×4): qty 1

## 2018-09-22 MED ORDER — FUROSEMIDE 40 MG PO TABS
20.0000 mg | ORAL_TABLET | Freq: Every day | ORAL | Status: DC
  Administered 2018-09-22: 20 mg via ORAL
  Filled 2018-09-22: qty 1

## 2018-09-22 MED ORDER — ADULT MULTIVITAMIN W/MINERALS CH
1.0000 | ORAL_TABLET | Freq: Every day | ORAL | Status: DC
  Administered 2018-09-22 – 2018-09-25 (×4): 1 via ORAL
  Filled 2018-09-22 (×4): qty 1

## 2018-09-22 MED ORDER — SODIUM CHLORIDE 0.9 % IV SOLN
INTRAVENOUS | Status: DC
  Administered 2018-09-22 – 2018-09-24 (×3): via INTRAVENOUS

## 2018-09-22 MED ORDER — ORAL CARE MOUTH RINSE
15.0000 mL | Freq: Two times a day (BID) | OROMUCOSAL | Status: DC
  Administered 2018-09-22 – 2018-09-24 (×4): 15 mL via OROMUCOSAL

## 2018-09-22 MED ORDER — PROMETHAZINE HCL 25 MG/ML IJ SOLN
25.0000 mg | Freq: Four times a day (QID) | INTRAMUSCULAR | Status: DC | PRN
  Administered 2018-09-23 – 2018-09-24 (×2): 25 mg via INTRAVENOUS
  Filled 2018-09-22 (×3): qty 1

## 2018-09-22 MED ORDER — ATORVASTATIN CALCIUM 20 MG PO TABS
20.0000 mg | ORAL_TABLET | Freq: Every day | ORAL | Status: DC
  Administered 2018-09-22 – 2018-09-24 (×3): 20 mg via ORAL
  Filled 2018-09-22 (×3): qty 1

## 2018-09-22 MED ORDER — WARFARIN - PHARMACIST DOSING INPATIENT: Freq: Every day | Status: DC

## 2018-09-22 MED ORDER — PANTOPRAZOLE SODIUM 40 MG PO TBEC
40.0000 mg | DELAYED_RELEASE_TABLET | Freq: Every day | ORAL | Status: DC
  Administered 2018-09-22: 40 mg via ORAL
  Filled 2018-09-22: qty 1

## 2018-09-22 NOTE — Consult Note (Signed)
ATTENDING ATTESTATION: Agree with assessment and plan as outlined. Patient with ongoing severe nausea, reported vomiting, epigastric pain. Difficult to get history from the patient, family answering questions for her - nausea appears to be most of the time. She's had a CT abdomen / pelvis on 10/8 which did not show any cause for her pain. US shows nonspecific gallbladder wall thickening, no evidence of stones. HIDA scan normal. While her ALT and AST are very mildly elevated, AP and direct bilirubin normal. Prior MRCP in 2016 showed a ? 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 Recent US and CT show a normal biliary tree (biliary abnormality was previously seen on prior CT). Given her liver imaging thus far, mild transaminitis may be a red herring and not related to her symptoms, but may consider MRCP pending her course. Will work LFTs up with some serologies initially, but given her ongoing nausea and pain I offered her an upper endoscopy initially to evaluate those symptoms. I discussed the risks / benefits of endoscopy, given her co-morbidities her case would need to be done with anesthesia. I would hope to perform her case tomorrow but given limited anesthesia availability tomorrow, it may not happen until Thursday. Continue supportive care for now and trial of BID PPI. Okay to continue coumadin for diagnostic EGD. Given her DM, if EGD is unremarkable may consider gastric emptying study. She agreed with the plan.    Cellar, MD [facility]           Referring Provider:  Dr. Cathlean Sauer, [facility] Primary Care Physician:  Audley Hose, MD Primary Gastroenterologist:  Dr. Fuller Plan  Reason for Consultation:  Abdominal pain and elevated LFT's  HPI: Kristen Lozano is a 62 y.o. 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  Patient was recently hospitalized from 10/14-10/16 due to abdominal pain, nausea and vomiting.  Etiology was not clear. Per discharge summary, pt had multiple CT abdominal imaging recently with no acute findings, UA unremarkable, and quick resolution of symptoms with supportive care. [facility] was consulted and agreed with supportive care.They did not deem her a good endoscopic candidate given her multiple comorbidities and low yield of any useful findings.   Patient states that she continues to have abdominal pain, mid to upper abdomen.  It is associated with nausea, vomiting.  The patient does not speak much so does not give history.  She was seen with her ex-husband at bedside and her sister was on the phone.  They say that she had these same symptoms a few years ago and they went away and she did really well for a few years but symptoms have returned again over the past 1-2 months.  While I was in the room her "vomiting" was demonstrated and appeared to be just "spitting" of saliva into the emesis bag.  ED Course: pt was found to have WBC 7.7, lactic acid 2.49, 1.70, negative urinalysis, electrolytes renal function okay, abnormal liver function (ALP 65, AST 102, ALT 85, total bilirubin 1.5), temperature normal, sinus tachycardia, no tachypnea, oxygen saturation 100% on room air.  Abdominal ultrasound showed nonspecific gallbladder wall thickening, but no evidence of gallstones or sludge.   HIDA scan just performed this morning that showed the following:  FINDINGS: Liver uptake of radiotracer is unremarkable.  There is visualization of gallbladder and small bowel, indicating patency of the cystic and common bile ducts. Ejection fraction value not determine due to patient's inability to remains stationary and proceed with this study.  IMPRESSION: Patency of the cystic and common bile ducts,  evidenced by visualization of gallbladder and small bowel.   LFT's down slightly today with AST 70 and ALT 67.  Total bili stable at 1.6 and ALP normal at 51.   The only time time that she was seen in our office was 2015/2016 for dysphagia and abnormal imaging of CBD.  Last MRI/MRCP in 2016 showed the following:  IMPRESSION: 1. No change in fusiform dilatation of the common bile duct with persistent stricturing at the junction wiht pancreatic duct. 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 No intrahepatic biliary duct dilatation. No pancreatic duct dilatation. 2. Mild improvement in left upper pole renal cortical infarction / infection.  She was referred to [facility] at that time for that issue but her sister tells me that she could not get there and so decided to not go or proceed with that.    Past Medical History:  Diagnosis Date  . Allergic rhinitis    Requires cetirizine, singulair, and fluticasone.  . Anxiety    Has been on Xanax since 2009. Uses it for stress, anxiety, and insomnia. No contract yet.  . Arthritis   . CHF (congestive heart failure) (HCC)    EF 35% after stroke, presumed ischemic  . Chronic pain    Has OA of knees B. No Xrays in echart. Not requiring narcotics.  . Colitis 12/2017  . Depression   . Diabetes mellitus    Type 2, non insulin dependent. Was dx'd prior to 2008.  Marland Kitchen Hyperglycemia   . Hyperlipemia   . Hypertension   . Sickle cell trait ([place])   . 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 TEE WITHOUT CARDIOVERSION N/A 09/06/2014   Procedure: TRANSESOPHAGEAL ECHOCARDIOGRAM (TEE);  Surgeon: Pixie Casino, MD;  Location: [facility] ENDOSCOPY;  Service: Cardiovascular;  Laterality: N/A;    Prior to Admission medications   Medication Sig Start Date End Date Taking? Authorizing Provider  ASPIRIN LOW DOSE 81 MG  EC tablet Take 1 tablet (81 mg total) by mouth daily. Patient taking differently: Take 81 mg by mouth daily.  01/22/18  Yes Hilty, Nadean Corwin, MD  atorvastatin (LIPITOR) 20 MG tablet Take 1 tablet (20 mg total) by mouth daily at 6 PM. Please schedule appointment for refills. 01/02/18  Yes Hilty, Nadean Corwin, MD  b complex vitamins tablet Take 1 tablet by mouth daily.   Yes [provider]  carvedilol (COREG) 12.5 MG tablet Take 12.5 mg by mouth 2 (two) times daily with a meal.  09/18/18  Yes [provider]  feeding supplement, ENSURE ENLIVE, (ENSURE ENLIVE) LIQD Take 237 mLs by mouth 3 (three) times daily between meals. 08/17/18  Yes Oretha Milch D, MD  furosemide (LASIX) 20 MG tablet Take 1 tablet (20 mg total) by mouth daily. 09/02/18 09/02/19 Yes Georgette Shell, MD  lisinopril (PRINIVIL,ZESTRIL) 2.5 MG tablet Take 2.5 mg by mouth daily.  09/18/18  Yes [provider]  metFORMIN (GLUCOPHAGE-XR) 500 MG 24 hr tablet Take 1,000 mg by mouth 2 (two) times daily.  08/06/18  Yes [provider]  Multiple Vitamins-Minerals (MULTIVITAMIN ADULT) TABS Take 1 tablet by  mouth daily.   Yes [provider]  potassium chloride (K-DUR) 10 MEQ tablet Take 1 tablet (10 mEq total) by mouth daily. 09/02/18  Yes Georgette Shell, MD  warfarin (COUMADIN) 5 MG tablet Tale 1.5 Tablets (7.57m)daily OR as directed by coumadin clinic Patient taking differently: Take 5-7.5 mg by mouth See admin instructions. Take 1 1/2 tablets (7.5 mg) on Monday and Friday evening, take 1 tablet (5 mg) on Sunday, Tuesday, Wednesday, Thursday, Saturday evening or as directed by coumadin clinic 05/29/18  Yes Hilty, KNadean Corwin MD  docusate sodium (COLACE) 100 MG capsule Take 1 capsule (100 mg total) by mouth 2 (two) times daily. Patient taking differently: Take 100 mg by mouth 2 (two) times daily as needed for mild constipation.  09/09/18   NDesiree Hane MD  Dulaglutide (TRULICITY) 01.61MWR/6.0AV SOPN Inject 0.75 mg into the skin every Saturday.    [provider]  Metoprolol Tartrate 75 MG TABS Take 75 mg by mouth 2 (two) times daily. Patient not taking: Reported on 09/21/2018 09/09/18   NOretha MilchD, MD  ondansetron (ZOFRAN) 4 MG tablet Take 1 tablet (4 mg total) by mouth every 6 (six) hours as needed for nausea. 09/02/18   MGeorgette Shell MD  pantoprazole (PROTONIX) 40 MG tablet Take 1 tablet (40 mg total) by mouth daily. 08/17/18   NOretha MilchD, MD  polyethylene glycol (MIRALAX / GLYCOLAX) packet Take 17 g by mouth daily as needed. Patient taking differently: Take 17 g by mouth daily as needed for mild constipation or moderate constipation.  09/09/18   NOretha MilchD, MD  spironolactone (ALDACTONE) 25 MG tablet Take 1 tablet (25 mg total) by mouth daily. Patient not taking: Reported on 09/21/2018 09/09/18 09/09/19  NDesiree Hane MD    Current Facility-Administered Medications  Medication Dose Route Frequency Provider Last Rate Last Dose  . aspirin EC tablet 81 mg  81 mg Oral Daily NIvor Costa MD   81 mg at 09/22/18 0914  . atorvastatin (LIPITOR) tablet 20 mg  20 mg Oral q1800 NIvor Costa MD      . B-complex with vitamin C tablet 1 tablet  1 tablet Oral Daily NIvor Costa MD   1 tablet at 09/22/18 0914  . carvedilol (COREG) tablet 12.5 mg  12.5 mg Oral BID WC NIvor Costa MD   12.5 mg at 09/22/18 0914  . chlorhexidine (PERIDEX) 0.12 % solution 15 mL  15 mL Mouth Rinse BID NIvor Costa MD      . docusate sodium (COLACE) capsule 100 mg  100 mg Oral Daily PRN NIvor Costa MD      . feeding supplement (ENSURE ENLIVE) (ENSURE ENLIVE) liquid 237 mL  237 mL Oral TID BM NIvor Costa MD      . furosemide (LASIX) tablet 20 mg  20 mg Oral Daily NIvor Costa MD   20 mg at 09/22/18 04098 . hydrALAZINE (APRESOLINE) injection 5 mg  5 mg Intravenous Q2H PRN NIvor Costa MD      . insulin aspart (novoLOG) injection 0-5 Units  0-5 Units Subcutaneous QHS NIvor Costa MD      .  insulin aspart (novoLOG) injection 0-9 Units  0-9 Units Subcutaneous TID WC NIvor Costa MD   2 Units at 09/22/18 0755  . lisinopril (PRINIVIL,ZESTRIL) tablet 2.5 mg  2.5 mg Oral Daily NIvor Costa MD   2.5 mg at 09/22/18 0914  . MEDLINE mouth rinse  15 mL Mouth Rinse q12n4p NIvor Costa MD      .  morphine 2 MG/ML injection 2 mg  2 mg Intravenous Q4H PRN Ivor Costa, MD      . multivitamin with minerals tablet 1 tablet  1 tablet Oral Daily Ivor Costa, MD   1 tablet at 09/22/18 0914  . ondansetron (ZOFRAN) injection 4 mg  4 mg Intravenous Q8H PRN Ivor Costa, MD   4 mg at 09/22/18 1115  . pantoprazole (PROTONIX) EC tablet 40 mg  40 mg Oral Daily Ivor Costa, MD   40 mg at 09/22/18 0914  . [START ON 09/23/2018] pneumococcal 23 valent vaccine (PNU-IMMUNE) injection 0.5 mL  0.5 mL Intramuscular Tomorrow-1000 Ivor Costa, MD      . Warfarin - Pharmacist Dosing Inpatient   Does not apply q1800 Minda Ditto, RPH      . zolpidem (AMBIEN) tablet 5 mg  5 mg Oral QHS PRN Ivor Costa, MD        Allergies as of 09/21/2018  . (No Known Allergies)    Family History  Problem Relation Age of Onset  . Diabetes Mother   . Hypertension Mother   . Heart disease Father   . Heart attack Father   . Hypertension Father   . Diabetes Father   . Ovarian cancer Sister   . Liver cancer Sister   . Sickle cell anemia Daughter   . Schizophrenia Daughter   . Hypertension Sister   . Hypertension Brother   . Hypertension Daughter   . Kidney disease Sister        x2  . Kidney disease Brother   . Stroke Neg Hx   . Esophageal cancer Neg Hx   . Colon cancer Neg Hx   . Colon polyps Neg Hx     Social History   Socioeconomic History  . Marital status: Legally Separated    Spouse name: Not on file  . Number of children: 2  . Years of education: 9 TH  . Highest education level: Not on file  Occupational History  . Occupation: Disabled  Social Needs  . Financial resource strain: Not on file  . Food insecurity:     Worry: Not on file    Inability: Not on file  . Transportation needs:    Medical: Not on file    Non-medical: Not on file  Tobacco Use  . Smoking status: Never Smoker  . Smokeless tobacco: Never Used  Substance and Sexual Activity  . Alcohol use: No    Alcohol/week: 0.0 standard drinks  . Drug use: No  . Sexual activity: Not on file  Lifestyle  . Physical activity:    Days per week: Not on file    Minutes per session: Not on file  . Stress: Not on file  Relationships  . Social connections:    Talks on phone: Not on file    Gets together: Not on file    Attends religious service: Not on file    Active member of club or organization: Not on file    Attends meetings of clubs or organizations: Not on file    Relationship status: Not on file  . Intimate partner violence:    Fear of current or ex partner: Not on file    Emotionally abused: Not on file    Physically abused: Not on file    Forced sexual activity: Not on file  Other Topics Concern  . Not on file  Social History Narrative   Patient is married with 2 children.   Patient is right handed.  Patient has 9 th grade education.   Patient drinks 2 cups daily.    Review of Systems: ROS is O/W negative except as mentioned in HPI.  Physical Exam: Vital signs in last 24 hours: Temp:  [97.4 F (36.3 C)-98.9 F (37.2 C)] 97.4 F (36.3 C) (10/29 0657) Pulse Rate:  [65-210] 117 (10/29 0713) Resp:  [16-25] 20 (10/29 0713) BP: (127-153)/(89-109) 147/97 (10/29 0713) SpO2:  [97 %-100 %] 97 % (10/29 0713) Weight:  [50.3 kg-51.7 kg] 50.3 kg (10/29 0102) Last BM Date: 09/21/18 General:  Alert, Well-developed, well-nourished, pleasant and cooperative in NAD Head:  Normocephalic and atraumatic. Eyes:  Sclera clear, no icterus.   Conjunctiva pink. Ears:  Normal auditory acuity. Mouth:  No deformity or lesions.   Lungs:  Clear throughout to auscultation.   No wheezes, crackles, or rhonchi.  Heart:  Regular rate and rhythm; no  murmurs, clicks, rubs,  or gallops. Abdomen:  Soft,nontender, BS active,nonpalp mass or hsm.   Rectal:  Deferred  Msk:  Symmetrical without gross deformities. Pulses:  Normal pulses noted. Extremities:  Without clubbing or edema. Neurologic:  Alert and  oriented x4;  grossly normal neurologically. Skin:  Intact without significant lesions or rashes. Psych:  Alert and cooperative. Normal mood and affect.  Lab Results: Recent Labs    09/21/18 1836 09/22/18 0136  WBC 7.7 8.9  HGB 14.2 12.2  HCT 45.2 39.0  PLT 279 249   BMET Recent Labs    09/21/18 1836 09/22/18 0136  NA 138 137  K 3.8 4.2  CL 101 105  CO2 26 23  GLUCOSE 197* 180*  BUN 13 13  CREATININE 0.85 0.78  CALCIUM 9.6 8.8*   LFT Recent Labs    09/22/18 0136  PROT 6.5  ALBUMIN 3.3*  AST 70*  ALT 67*  ALKPHOS 51  BILITOT 1.6*   PT/INR Recent Labs    09/22/18 0136  LABPROT 20.5*  INR 1.78   Studies/Results: Nm Hepatobiliary Liver Func  Result Date: 09/22/2018 CLINICAL DATA:  Upper abdominal pain EXAM: NUCLEAR MEDICINE HEPATOBILIARY IMAGING VIEWS: Anterior right upper quadrant. Patient was unable to remain stationary to allow for ejection fraction assessment. RADIOPHARMACEUTICALS:  5.35 mCi Tc-57m Choletec IV COMPARISON:  None. FINDINGS: Liver uptake of radiotracer is unremarkable. There is visualization of gallbladder and small bowel, indicating patency of the cystic and common bile ducts. Ejection fraction value not determine due to patient's inability to remains stationary and proceed with this study. IMPRESSION: Patency of the cystic and common bile ducts, evidenced by visualization of gallbladder and small bowel. Electronically Signed   By: WLowella GripIII M.D.   On: 09/22/2018 09:02   UKoreaAbdomen Limited  Result Date: 09/21/2018 CLINICAL DATA:  Abdominal pain with vomiting and elevated LFTs as well as elevated bilirubin. EXAM: ULTRASOUND ABDOMEN LIMITED RIGHT UPPER QUADRANT COMPARISON:  CT  09/01/2018 FINDINGS: Gallbladder: No evidence of gallstones or sludge. Mild wall thickening measuring 5.7 mm. Possible tiny amount of pericholecystic fluid. Common bile duct: Diameter: 3.1 mm. Liver: No focal lesion identified. Within normal limits in parenchymal echogenicity. Portal vein is patent on color Doppler imaging with normal direction of blood flow towards the liver. Right pleural effusion. IMPRESSION: Nonspecific gallbladder wall thickening. No evidence of gallstones or sludge. HIDA scan may be helpful to assess gallbladder function. Normal liver and biliary tree. Right pleural effusion. Electronically Signed   By: DMarin OlpM.D.   On: 09/21/2018 22:20   IMPRESSION:  *Nausea, vomiting, abdominal pain and  elevated LFT's:  ? If these are related or not.  Extensive evaluation thus far has been unrevealing.  While I was in the room her "vomiting" was actually "spitting" of clear saliva material.  She does not speak much or give history.  There has been mention of gastroparesis.  She did have abnormal bile duct imaging in the past to which she was referred to [facility] and never went there, but LFT's had been normal in the past.  Elevation currently is very mild. *CHF with EF of 20-25% *LV mural thrombus on coumadin:  INR 1.78 *History of CVA with dysphagia  PLAN: *She has never had EGD so we are going to start with that. *They have her on pantoprazole 40 mg daily here but was not on PPI at home.  I am going to increase to BID for now. *In regards to elevated LFT's, will fractionate her bili with hepatic function panel.  Will check CK, ANA, AMA, ASMA, hep B surface Ag, hep B surface Ab, and Hep C Ab.   Jessica D. Zehr  09/22/2018, 12:21 PM

## 2018-09-22 NOTE — Progress Notes (Signed)
Initial Nutrition Assessment  DOCUMENTATION CODES:   Non-severe (moderate) malnutrition in context of chronic illness  INTERVENTION:    Boost Breeze po TID, each supplement provides 250 kcal and 9 grams of protein  Provide MVI daily  NUTRITION DIAGNOSIS:   Moderate Malnutrition related to chronic illness(CHF/CVA) as evidenced by moderate fat depletion, mild fat depletion, moderate muscle depletion, severe muscle depletion.  GOAL:   Patient will meet greater than or equal to 90% of their needs  MONITOR:   PO intake, Supplement acceptance, Diet advancement, Labs, Weight trends, I & O's  REASON FOR ASSESSMENT:   Malnutrition Screening Tool    ASSESSMENT:   Patient with PMH significant for HTN, DM, CHF, depression, HLD, and CVA. Recently admitted 10/14-10/16 due to nausea, vomiting, and abdominal pain from unclear etiology (CT scans were negative). Presents this admission with the same complaints. Admitted for abnormal liver function. Plan for HIDA scan.    Pt denies having a loss in appetite PTA. States she would eat three meals a day that consisted of fried chicken, greens, Ensure (TID), potatoes, oatmeal, bologna sandwiches, and other meats. She would find herself vomiting after each meal but would be hungry when she was finished vomiting. Pt was recently seen by this RD on 9/18 and she denied loss of appetite then. Pt is currently on a clear liquid diet. RD observed pt drinking broth and eating jello without complication. Pt reports the last time she vomited was early this am before breakfast. She amenable to supplementation.   Pt endorses a UBW of 110- 114 lb and denies any recent wt loss. Records indicate pt weighed 110 lb during her last RD visit on 9/16 and 111 lb this admission. Nutrition-Focused physical exam completed.   Medications reviewed and include: B-complex with Vit C, 20 mg lasix once daily, MVI with minerals Labs reviewed: CBG 180-199  NUTRITION - FOCUSED  PHYSICAL EXAM:    Most Recent Value  Orbital Region  Mild depletion  Upper Arm Region  Moderate depletion  Thoracic and Lumbar Region  Moderate depletion  Buccal Region  Mild depletion  Temple Region  Moderate depletion  Clavicle Bone Region  Moderate depletion  Clavicle and Acromion Bone Region  Moderate depletion  Scapular Bone Region  Moderate depletion  Dorsal Hand  Mild depletion  Patellar Region  Severe depletion  Anterior Thigh Region  Severe depletion  Posterior Calf Region  Severe depletion  Edema (RD Assessment)  None     Diet Order:   Diet Order            Diet clear liquid Room service appropriate? Yes; Fluid consistency: Thin  Diet effective now              EDUCATION NEEDS:   Education needs have been addressed  Skin:  Skin Assessment: Reviewed RN Assessment  Last BM:  09/21/18  Height:   Ht Readings from Last 1 Encounters:  09/22/18 5\' 3"  (1.6 m)    Weight:   Wt Readings from Last 1 Encounters:  09/22/18 50.3 kg    Ideal Body Weight:  52.3 kg  BMI:  Body mass index is 19.64 kg/m.  Estimated Nutritional Needs:   Kcal:  1500-1700 kcal  Protein:  75-90 grams  Fluid:  >/= 1.5 L/day  Mariana Single RD, LDN Clinical Nutrition Pager # - [phone]

## 2018-09-22 NOTE — Progress Notes (Signed)
PROGRESS NOTE    AEISHA Lozano  OJJ:[id number] DOB: [birth year] DOA: 09/21/2018 PCP: Audley Hose, MD Outpatient Specialists:     Brief Narrative: Kristen Lozano is 62 y.o patient, admitted for abdominal pain. Has a history of significant HTN, hyperlipidemia, DM, stroke, GERD, CHF with EF 20%, colitis, sickle cell trait, LV mural thrombus  on coumadin. Patient has been hospitalized from 10/14-10/16 for abdominal pain, nausea, and vomiting. At that time, abdominal CT and UA were unremarkable. GI indicated that she was not a good candidate for endoscopy. Therefore, discharged her with supportive care. In the current episode, patient found to have mildly elevated liver enzymes (ALP 65, AST 102, ALT 85, total bilirubin 1.5), WBC 7.7. Abdominal US showed nonspecific gallbladder wall thickening and Hida scan was negative.     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 Abdominal pain: Uncertain cause. Liver function enzymes are mildly elevated. Abdominal US showed nonspecific gallbladder wall thickening, but no evidence of gallstones or sludge. HIDA scan was normal.  Patient continue to have abdominal pain, vomiting, and diarrhea. However, symptoms have been better than yesterday. Waiting on GI consult. Continue on Phenergan IV 25mg , Morphine IV 2mg , Ondansetron IV 4mg  PRN  2. GERD: Chronic issue, likely contributed to her current abdominal pain, nausea and vomiting episode. Patient is not a good candidiate for endoscopy because of comorbidities. Continue on Protonix. Waiting on GI consult.     3. Essential HTN: Controlled with medication. Last reported, 147/97. Continue on carvedilol 12.5mg  PO BID and Lisinopril 2.5 mg PO daily.   4. Cerebral infarction: No focal deficit. Continue on Atorvastatin 20mg  PO and Asprinin 81 mg daily.   5. LV mural thrombus following MI: No anginal symptoms. Continue on Coumadin 5mg  PO. Monitor INR  6. Chronic combined systolic and diastolic CHF: Echo done on 08/10/2018 showed EF of 20-25%. Patient do not of SOB, abdominal ascites or leg edema.Continue observation.   7. DM2 with vascular disease:  A1C 7.4. On sliding scale. Continue monitoring CBG.   8. Sinus tachycardia: Chronic issue, current HR 117.   9. Hyperlipidemia: Continue on Atorvastatin 20 mg PO.     DVT prophylaxis:  Coumadin  Code Status:  Full code  Family Communication:  No family present at bedside.   Disposition Plan:  Once patient stabilize and tolerate PO   Consultants:  Awaiting on GI consult   Procedures: Hida scan  Antimicrobials: Subjective: Patient reported that her symptoms have been getting better. However, she still has abdominal pain and has been vomiting.    Objective: Vitals:   09/22/18 0102 09/22/18 0105 09/22/18 0657 09/22/18 0713  BP:  (!) 144/89 (!) 147/100 (!) 147/97  Pulse:  (!) 122 (!) 120 (!) 117  Resp:  20 16 20   Temp:  98 F (36.7 C) (!) 97.4 F (36.3 C)   TempSrc:  Oral Oral   SpO2:  99% 98% 97%  Weight: 50.3 kg     Height: 5\' 3"  (1.6 m)      No intake or output data in the 24 hours ending 09/22/18 [place]  09/21/18 1712 09/22/18 0102  Weight: 51.7 kg 50.3 kg    Examination:  General exam: Appears acutely ill Respiratory system: Clear to auscultation. Respiratory effort normal. Cardiovascular system: Tachycardic, S1 & S2 heard, RRR. No JVD, murmurs, rubs, gallops or clicks. No pedal edema. Gastrointestinal system: Abdomen tender. No organomegaly or masses felt. Normal bowel sounds heard. Central nervous  system: Alert and oriented. No focal neurological deficits. Extremities: Symmetric 5 x 5 power. Skin: No rashes, lesions or ulcers Psychiatry: Judgement and insight appear normal. Mood & affect appropriate.     Data Reviewed: I have personally reviewed following labs and imaging studies  CBC: Recent Labs  Lab 09/21/18 1836 09/22/18 0136  WBC 7.7 8.9  HGB 14.2 12.2  HCT 45.2 39.0  MCV 68.3* 69.1*  PLT 279 161   Basic Metabolic Panel: Recent Labs  Lab 09/21/18 1836 09/22/18 0136  NA 138 137  K 3.8 4.2  CL 101 105  CO2 26 23  GLUCOSE 197* 180*  BUN 13 13  CREATININE 0.85 0.78  CALCIUM 9.6 8.8*   GFR: Estimated Creatinine Clearance: 57.2 mL/min (by C-G formula based on SCr of 0.78 mg/dL). Liver Function Tests: Recent Labs  Lab 09/21/18 1836 09/22/18 0136  AST 102* 70*  ALT 85* 67*  ALKPHOS 65 51  BILITOT 1.5* 1.6*  PROT 7.7 6.5  ALBUMIN 4.0 3.3*   Recent Labs  Lab 09/21/18 1836  LIPASE 30   No results for input(s): AMMONIA in the last 168 hours. Coagulation Profile: Recent Labs  Lab 09/22/18 0136  INR 1.78   Cardiac Enzymes: No results for input(s): CKTOTAL, CKMB, CKMBINDEX, TROPONINI in the last 168 hours. BNP (last 3 results) No results for input(s): PROBNP in the last 8760 hours. HbA1C: No results for input(s): HGBA1C in the last 72 hours. CBG: Recent Labs  Lab 09/22/18 0033 09/22/18 0749 09/22/18 1208  GLUCAP 180* 166* 199*   Lipid Profile: No results for input(s): CHOL, HDL, LDLCALC, TRIG, CHOLHDL, LDLDIRECT in the last 72 hours. Thyroid Function Tests: No results for input(s): TSH, T4TOTAL, FREET4, T3FREE, THYROIDAB in the last 72 hours. Anemia Panel: No results for input(s): VITAMINB12, FOLATE, FERRITIN, TIBC, IRON, RETICCTPCT in the last 72 hours. Urine analysis:    Component Value Date/Time   COLORURINE YELLOW 09/21/2018 1847   APPEARANCEUR HAZY (A) 09/21/2018 1847   LABSPEC 1.022 09/21/2018 1847   PHURINE 5.0 09/21/2018 1847     GLUCOSEU 50 (A) 09/21/2018 1847   HGBUR NEGATIVE 09/21/2018 1847   BILIRUBINUR NEGATIVE 09/21/2018 1847   KETONESUR 5 (A) 09/21/2018 1847   PROTEINUR >=300 (A) 09/21/2018 1847   UROBILINOGEN 1.0 09/07/2014 1405   NITRITE NEGATIVE 09/21/2018 1847   LEUKOCYTESUR NEGATIVE 09/21/2018 1847   Sepsis Labs: @LABRCNTIP (procalcitonin:4,lacticidven:4)  )No results found for this or any previous visit (from the past 240 hour(s)).       Radiology Studies: Nm Hepatobiliary Liver Func  Result Date: 09/22/2018 CLINICAL DATA:  Upper abdominal pain EXAM: NUCLEAR MEDICINE HEPATOBILIARY IMAGING VIEWS: Anterior right upper quadrant. Patient was unable to remain stationary to allow for ejection fraction assessment. RADIOPHARMACEUTICALS:  5.35 mCi Tc-75m  Choletec IV COMPARISON:  None. FINDINGS: Liver uptake of radiotracer is unremarkable. There is visualization of gallbladder and small bowel, indicating patency of the cystic and common bile ducts. Ejection fraction value not determine due to patient's inability to remains stationary and proceed with this study. IMPRESSION: Patency of the cystic and common bile ducts, evidenced by visualization of gallbladder and small bowel. Electronically Signed  By: Lowella Grip III M.D.   On: 09/22/2018 09:02   US Abdomen Limited  Result Date: 09/21/2018 CLINICAL DATA:  Abdominal pain with vomiting and elevated LFTs as well as elevated bilirubin. EXAM: ULTRASOUND ABDOMEN LIMITED RIGHT UPPER QUADRANT COMPARISON:  CT 09/01/2018 FINDINGS: Gallbladder: No evidence of gallstones or sludge. Mild wall thickening measuring 5.7 mm. Possible tiny amount of pericholecystic fluid. Common bile duct: Diameter: 3.1 mm. Liver: No focal lesion identified. Within normal limits in parenchymal echogenicity. Portal vein is patent on color Doppler imaging with normal direction of blood flow towards the liver. Right pleural effusion. IMPRESSION: Nonspecific gallbladder wall thickening. No  evidence of gallstones or sludge. HIDA scan may be helpful to assess gallbladder function. Normal liver and biliary tree. Right pleural effusion. Electronically Signed   By: Marin Olp M.D.   On: 09/21/2018 22:20        Scheduled Meds: . aspirin EC  81 mg Oral Daily  . atorvastatin  20 mg Oral q1800  . B-complex with vitamin C  1 tablet Oral Daily  . carvedilol  12.5 mg Oral BID WC  . chlorhexidine  15 mL Mouth Rinse BID  . feeding supplement (ENSURE ENLIVE)  237 mL Oral TID BM  . furosemide  20 mg Oral Daily  . insulin aspart  0-5 Units Subcutaneous QHS  . insulin aspart  0-9 Units Subcutaneous TID WC  . lisinopril  2.5 mg Oral Daily  . mouth rinse  15 mL Mouth Rinse q12n4p  . multivitamin with minerals  1 tablet Oral Daily  . pantoprazole  40 mg Oral Daily  . [START ON 09/23/2018] pneumococcal 23 valent vaccine  0.5 mL Intramuscular Tomorrow-1000  . Warfarin - Pharmacist Dosing Inpatient   Does not apply q1800   Continuous Infusions:   LOS: 0 days    Time spent:    Sterling Big, MD [facility] Pager [phone]  If 7PM-7AM, please contact night-coverage [email or web address] Password [email or web address] 09/22/2018, 12:18 PM

## 2018-09-22 NOTE — Progress Notes (Addendum)
PROGRESS NOTE    Kristen Lozano  QZE:[id number] DOB: Apr 07, 1956 DOA: 09/21/2018 PCP: Audley Hose, MD    Brief Narrative:  62 year old female who presented abdominal pain.  She does have significant past medical history for hypertension, dyslipidemia, type 2 diabetes mellitus, GERD and diastolic heart failure.  She also has sickle cell trait and a left ventricle thrombus.  Recent hospitalization 10/14-10/16 for intractable nausea vomiting abdominal pain.  Deemed multifactorial.  Patient had persistent symptoms at home including nausea, vomiting and abdominal pain in the lower quadrants.  The initial physical examination blood pressure 144/89, heart rate 122, respirate 20, temperature 98, oxygen saturation 99%, moist mucous membranes, lungs clear to auscultation bilaterally, heart S1-S2 present and rhythmic, abdomen was tender in the right lower quadrant, no rebound no guarding, lower extremity edema.  Patient was admitted to the hospital with working diagnosis of recurrent abdominal pain, possible gastritis.   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 Suspected gastritis. Will continue supportive medical therapy with IV famotidine, as needed promethazine, and clear liquid diet. Continue IV fluids for hydration and will follow on GI recommendations.   2. HTN. Continue carvedilol and lisinopril, hold on furosemide for now.   3. Systolic heart failure. Stable and chronic. Chronic LV thrombus. No signs of volume overload, will continue b blocker and ace inh therapy, hold on  furosemide for now and continue gentle hydration with isotonic saline. Continue warfarin INR today 1,78.   4. T2DM. Continue insulin sliding scale for glucose cover and monitoring.   5. Dyslipidemia. Continue statin therapy with atorvastatin.   6. History of CVA. Continue blood pressure control, statin and neuro checks per unit protocol.   DVT prophylaxis: warfarin   Code Status: full Family Communication: no family at the  Bedside  Disposition Plan/ discharge barriers: pending clinical improvement   Body mass index is 19.64 kg/m. Malnutrition Type:  Nutrition Problem: Moderate Malnutrition Etiology: chronic illness(CHF/CVA)   Malnutrition Characteristics:  Signs/Symptoms: moderate fat depletion, mild fat depletion, moderate muscle depletion, severe muscle depletion   Nutrition Interventions:  Interventions: Boost Breeze, MVI  RN Pressure Injury Documentation:     Consultants:   GI   Procedures:     Antimicrobials:       Subjective: Patient continue to have nausea and vomiting, no chest pain or dyspnea.   Objective: Vitals:   09/22/18 0105 09/22/18 0657 09/22/18 0713 09/22/18 1236  BP: (!) 144/89 (!) 147/100 (!) 147/97 123/85  Pulse: (!) 122 (!) 120 (!) 117 (!) 120  Resp: 20 16 20  (!) 24  Temp: 98 F (36.7 C) (!) 97.4 F (36.3 C)  98.3 F (36.8 C)  TempSrc: Oral Oral  Oral  SpO2: 99% 98% 97% 97%  Weight:      Height:        Intake/Output Summary (Last 24 hours) at 09/22/2018 1605 Last data filed at 09/22/2018 1200 Gross per 24 hour  Intake 120 ml  Output 1 ml  Net 119 ml   Filed Weights   09/21/18 1712 09/22/18 0102  Weight: 51.7 kg 50.3 kg  Examination:   General: deconditioned and ill looking appearing  Neurology: Awake and alert, non focal  E ENT: mild pallor, no icterus, oral mucosa moist Cardiovascular: No JVD. S1-S2 present, rhythmic, no gallops, rubs, or murmurs. No lower extremity edema. Pulmonary: positive breath sounds  bilaterally, adequate air movement, no wheezing, rhonchi or rales. Gastrointestinal. Abdomen mild distended with no organomegaly, non tender, no rebound or guarding Skin. No rashes Musculoskeletal: no joint deformities     Data Reviewed: I have personally reviewed following labs and imaging studies  CBC: Recent Labs  Lab 09/21/18 1836 09/22/18 0136  WBC 7.7 8.9  HGB 14.2 12.2  HCT 45.2 39.0  MCV 68.3* 69.1*  PLT 279 701   Basic Metabolic Panel: Recent Labs  Lab 09/21/18 1836 09/22/18 0136  NA 138 137  K 3.8 4.2  CL 101 105  CO2 26 23  GLUCOSE 197* 180*  BUN 13 13  CREATININE 0.85 0.78  CALCIUM 9.6 8.8*   GFR: Estimated Creatinine Clearance: 57.2 mL/min (by C-G formula based on SCr of 0.78 mg/dL). Liver Function Tests: Recent Labs  Lab 09/21/18 1836 09/22/18 0136  AST 102* 70*  ALT 85* 67*  ALKPHOS 65 51  BILITOT 1.5* 1.6*  PROT 7.7 6.5  ALBUMIN 4.0 3.3*   Recent Labs  Lab 09/21/18 1836  LIPASE 30   No results for input(s): AMMONIA in the last 168 hours. Coagulation Profile: Recent Labs  Lab 09/22/18 0136  INR 1.78   Cardiac Enzymes: No results for input(s): CKTOTAL, CKMB, CKMBINDEX, TROPONINI in the last 168 hours. BNP (last 3 results) No results for input(s): PROBNP in the last 8760 hours. HbA1C: No results for input(s): HGBA1C in the last 72 hours. CBG: Recent Labs  Lab 09/22/18 0033 09/22/18 0749 09/22/18 1208  GLUCAP 180* 166* 199*   Lipid Profile: No results for input(s): CHOL, HDL, LDLCALC, TRIG, CHOLHDL, LDLDIRECT in the last 72 hours. Thyroid Function Tests: No results for input(s): TSH, T4TOTAL, FREET4, T3FREE, THYROIDAB in the last 72 hours. Anemia Panel: No results for input(s): VITAMINB12, FOLATE, FERRITIN, TIBC, IRON, RETICCTPCT in the last 72 hours.    Radiology Studies: I have reviewed all of the imaging during this hospital visit personally     Scheduled Meds: . aspirin EC  81 mg Oral Daily  . atorvastatin  20  mg Oral q1800  . B-complex with vitamin C  1 tablet Oral Daily  . carvedilol  12.5 mg Oral BID WC  . chlorhexidine  15 mL Mouth Rinse BID  . feeding supplement (ENSURE ENLIVE)  237 mL Oral TID BM  . furosemide  20 mg Oral Daily  . insulin aspart  0-5 Units Subcutaneous QHS  . insulin aspart  0-9 Units Subcutaneous TID WC  . lisinopril  2.5 mg Oral Daily  . mouth rinse  15 mL Mouth Rinse q12n4p  . multivitamin with minerals  1 tablet Oral Daily  . pantoprazole  40 mg Oral BID  . [START ON 09/23/2018] pneumococcal 23 valent vaccine  0.5 mL Intramuscular Tomorrow-1000  . Warfarin - Pharmacist Dosing Inpatient   Does not apply q1800   Continuous Infusions:   LOS: 0 days        Ryleigh Buenger Gerome Apley, MD [facility] Pager [phone]

## 2018-09-22 NOTE — Plan of Care (Signed)

## 2018-09-22 NOTE — Progress Notes (Signed)
[place] for Warfarin Indication: LV mural thrombus  No Known Allergies  Patient Measurements: Height: 5\' 3"  (160 cm) Weight: 114 lb (51.7 kg) IBW/kg (Calculated) : 52.4  Vital Signs: Temp: 98.9 F (37.2 C) (10/28 1702) Temp Source: Oral (10/28 1702) BP: 142/100 (10/28 2300) Pulse Rate: 210 (10/28 2300)  Labs: Recent Labs    09/21/18 1836  HGB 14.2  HCT 45.2  PLT 279  CREATININE 0.85   Estimated Creatinine Clearance: 55.3 mL/min (by C-G formula based on SCr of 0.85 mg/dL).  Medical History: Past Medical History:  Diagnosis Date  . Allergic rhinitis    Requires cetirizine, singulair, and fluticasone.  . Anxiety    Has been on Xanax since 2009. Uses it for stress, anxiety, and insomnia. No contract yet.  . Arthritis   . CHF (congestive heart failure) (HCC)    EF 35% after stroke, presumed ischemic  . Chronic pain    Has OA of knees B. No Xrays in echart. Not requiring narcotics.  . Colitis 12/2017  . Depression   . Diabetes mellitus    Type 2, non insulin dependent. Was dx'd prior to 2008.  Marland Kitchen Hyperglycemia   . Hyperlipemia   . Hypertension   . Sickle cell trait ([place])   . Stroke [facility]) 09/05/14   Dominant left MCA infarcts secondary to unknown embolic source    Medications:  Scheduled:  . aspirin EC  81 mg Oral Daily  . atorvastatin  20 mg Oral q1800  . B-complex with vitamin C  1 tablet Oral Daily  . carvedilol  12.5 mg Oral BID WC  . chlorhexidine  15 mL Mouth Rinse BID  . feeding supplement (ENSURE ENLIVE)  237 mL Oral TID BM  . furosemide  20 mg Oral Daily  . insulin aspart  0-5 Units Subcutaneous QHS  . insulin aspart  0-9 Units Subcutaneous TID WC  . lisinopril  2.5 mg Oral Daily  . mouth rinse  15 mL Mouth Rinse q12n4p  . multivitamin with minerals  1 tablet Oral Daily  . pantoprazole  40 mg Oral Daily  . [START ON 09/23/2018] pneumococcal 23 valent vaccine  0.5 mL Intramuscular Tomorrow-1000   Assessment:   62 yoF to ED 10/28 with N/V, abd pain Prev admit 10/14-16 for same sx  PMH: CHF, CVA, DM2, HTN, HPL, presumed gastroparesis-no gastric emptying study seen last admit Hx LV mural thrombus on Warfarin 7.5mg  M/F, 5mg  other days, LD 10/27  Admit INR 1.78  Goal of Therapy:  INR 2-3 Monitor platelets by anticoagulation protocol: Yes   Plan:   Warfarin 5mg  this am  Daily Protime/INR  Monitor s/s bleed  Minda Ditto PharmD Pager [phone] 09/22/2018, 3:34 AM

## 2018-09-22 NOTE — H&P (View-Only) (Signed)
ATTENDING ATTESTATION: Agree with assessment and plan as outlined. Patient with ongoing severe nausea, reported vomiting, epigastric pain. Difficult to get history from the patient, family answering questions for her - nausea appears to be most of the time. She's had a CT abdomen / pelvis on 10/8 which did not show any cause for her pain. US shows nonspecific gallbladder wall thickening, no evidence of stones. HIDA scan normal. While her ALT and AST are very mildly elevated, AP and direct bilirubin normal. Prior MRCP in 2016 showed a ? 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 Recent US and CT show a normal biliary tree (biliary abnormality was previously seen on prior CT). Given her liver imaging thus far, mild transaminitis may be a red herring and not related to her symptoms, but may consider MRCP pending her course. Will work LFTs up with some serologies initially, but given her ongoing nausea and pain I offered her an upper endoscopy initially to evaluate those symptoms. I discussed the risks / benefits of endoscopy, given her co-morbidities her case would need to be done with anesthesia. I would hope to perform her case tomorrow but given limited anesthesia availability tomorrow, it may not happen until Thursday. Continue supportive care for now and trial of BID PPI. Okay to continue coumadin for diagnostic EGD. Given her DM, if EGD is unremarkable may consider gastric emptying study. She agreed with the plan.   Clotiel Troop, MD [facility]           Referring Provider:  Dr. Arrien, TRH Primary Care Physician:  Bakare, Mobolaji B, MD Primary Gastroenterologist:  Dr. Stark  Reason for Consultation:  Abdominal pain and elevated LFT's  HPI: Kristen Lozano is a 62 y.o. 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  Patient was recently hospitalized from 10/14-10/16 due to abdominal pain, nausea and vomiting.  Etiology was not clear. Per discharge summary, pt had multiple CT abdominal imaging recently with no acute findings, UA unremarkable, and quick resolution of symptoms with supportive care. [facility] was consulted and agreed with supportive care.They did not deem her a good endoscopic candidate given her multiple comorbidities and low yield of any useful findings.   Patient states that she continues to have abdominal pain, mid to upper abdomen.  It is associated with nausea, vomiting.  The patient does not speak much so does not give history.  She was seen with her ex-husband at bedside and her sister was on the phone.  They say that she had these same symptoms a few years ago and they went away and she did really well for a few years but symptoms have returned again over the past 1-2 months.  While I was in the room her "vomiting" was demonstrated and appeared to be just "spitting" of saliva into the emesis bag.  ED Course: pt was found to have WBC 7.7, lactic acid 2.49, 1.70, negative urinalysis, electrolytes renal function okay, abnormal liver function (ALP 65, AST 102, ALT 85, total bilirubin 1.5), temperature normal, sinus tachycardia, no tachypnea, oxygen saturation 100% on room air.  Abdominal ultrasound showed nonspecific gallbladder wall thickening, but no evidence of gallstones or sludge.   HIDA scan just performed this morning that showed the following:  FINDINGS: Liver uptake of radiotracer is unremarkable.   There is visualization of gallbladder and small bowel, indicating patency of the cystic and common bile ducts. Ejection fraction value not determine due to patient's inability to remains stationary and proceed with this study.  IMPRESSION: Patency of the cystic and common bile ducts,  evidenced by visualization of gallbladder and small bowel.   LFT's down slightly today with AST 70 and ALT 67.  Total bili stable at 1.6 and ALP normal at 51.   The only time time that she was seen in our office was 2015/2016 for dysphagia and abnormal imaging of CBD.  Last MRI/MRCP in 2016 showed the following:  IMPRESSION: 1. No change in fusiform dilatation of the common bile duct with persistent stricturing at the junction wiht pancreatic duct. 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 No intrahepatic biliary duct dilatation. No pancreatic duct dilatation. 2. Mild improvement in left upper pole renal cortical infarction / infection.  She was referred to [facility] at that time for that issue but her sister tells me that she could not get there and so decided to not go or proceed with that.    Past Medical History:  Diagnosis Date  . Allergic rhinitis    Requires cetirizine, singulair, and fluticasone.  . Anxiety    Has been on Xanax since 2009. Uses it for stress, anxiety, and insomnia. No contract yet.  . Arthritis   . CHF (congestive heart failure) (HCC)    EF 35% after stroke, presumed ischemic  . Chronic pain    Has OA of knees B. No Xrays in echart. Not requiring narcotics.  . Colitis 12/2017  . Depression   . Diabetes mellitus    Type 2, non insulin dependent. Was dx'd prior to 2008.  . Hyperglycemia   . Hyperlipemia   . Hypertension   . Sickle cell trait (HCC)   . 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 TEE WITHOUT CARDIOVERSION N/A 09/06/2014   Procedure: TRANSESOPHAGEAL ECHOCARDIOGRAM (TEE);  Surgeon: Kenneth C. Hilty, MD;  Location: [facility];  Service: Cardiovascular;  Laterality: N/A;    Prior to Admission medications   Medication Sig Start Date End Date Taking? Authorizing Provider  ASPIRIN LOW DOSE 81 MG  EC tablet Take 1 tablet (81 mg total) by mouth daily. Patient taking differently: Take 81 mg by mouth daily.  01/22/18  Yes Hilty, Kenneth C, MD  atorvastatin (LIPITOR) 20 MG tablet Take 1 tablet (20 mg total) by mouth daily at 6 PM. Please schedule appointment for refills. 01/02/18  Yes Hilty, Kenneth C, MD  b complex vitamins tablet Take 1 tablet by mouth daily.   Yes [provider]  carvedilol (COREG) 12.5 MG tablet Take 12.5 mg by mouth 2 (two) times daily with a meal.  09/18/18  Yes [provider]  feeding supplement, ENSURE ENLIVE, (ENSURE ENLIVE) LIQD Take 237 mLs by mouth 3 (three) times daily between meals. 08/17/18  Yes Nettey, Shayla D, MD  furosemide (LASIX) 20 MG tablet Take 1 tablet (20 mg total) by mouth daily. 09/02/18 09/02/19 Yes Mathews, Elizabeth G, MD  lisinopril (PRINIVIL,ZESTRIL) 2.5 MG tablet Take 2.5 mg by mouth daily.  09/18/18  Yes [provider]  metFORMIN (GLUCOPHAGE-XR) 500 MG 24 hr tablet Take 1,000 mg by mouth 2 (two) times daily.  08/06/18  Yes [provider]  Multiple Vitamins-Minerals (MULTIVITAMIN ADULT) TABS Take 1 tablet by   mouth daily.   Yes [provider]  potassium chloride (K-DUR) 10 MEQ tablet Take 1 tablet (10 mEq total) by mouth daily. 09/02/18  Yes Mathews, Elizabeth G, MD  warfarin (COUMADIN) 5 MG tablet Tale 1.5 Tablets (7.5mg)daily OR as directed by coumadin clinic Patient taking differently: Take 5-7.5 mg by mouth See admin instructions. Take 1 1/2 tablets (7.5 mg) on Monday and Friday evening, take 1 tablet (5 mg) on Sunday, Tuesday, Wednesday, Thursday, Saturday evening or as directed by coumadin clinic 05/29/18  Yes Hilty, Kenneth C, MD  docusate sodium (COLACE) 100 MG capsule Take 1 capsule (100 mg total) by mouth 2 (two) times daily. Patient taking differently: Take 100 mg by mouth 2 (two) times daily as needed for mild constipation.  09/09/18   Nettey, Shayla D, MD  Dulaglutide (TRULICITY) 0.75 MG/0.5ML  SOPN Inject 0.75 mg into the skin every Saturday.    [provider]  Metoprolol Tartrate 75 MG TABS Take 75 mg by mouth 2 (two) times daily. Patient not taking: Reported on 09/21/2018 09/09/18   Nettey, Shayla D, MD  ondansetron (ZOFRAN) 4 MG tablet Take 1 tablet (4 mg total) by mouth every 6 (six) hours as needed for nausea. 09/02/18   Mathews, Elizabeth G, MD  pantoprazole (PROTONIX) 40 MG tablet Take 1 tablet (40 mg total) by mouth daily. 08/17/18   Nettey, Shayla D, MD  polyethylene glycol (MIRALAX / GLYCOLAX) packet Take 17 g by mouth daily as needed. Patient taking differently: Take 17 g by mouth daily as needed for mild constipation or moderate constipation.  09/09/18   Nettey, Shayla D, MD  spironolactone (ALDACTONE) 25 MG tablet Take 1 tablet (25 mg total) by mouth daily. Patient not taking: Reported on 09/21/2018 09/09/18 09/09/19  Nettey, Shayla D, MD    Current Facility-Administered Medications  Medication Dose Route Frequency Provider Last Rate Last Dose  . aspirin EC tablet 81 mg  81 mg Oral Daily Niu, Xilin, MD   81 mg at 09/22/18 0914  . atorvastatin (LIPITOR) tablet 20 mg  20 mg Oral q1800 Niu, Xilin, MD      . B-complex with vitamin C tablet 1 tablet  1 tablet Oral Daily Niu, Xilin, MD   1 tablet at 09/22/18 0914  . carvedilol (COREG) tablet 12.5 mg  12.5 mg Oral BID WC Niu, Xilin, MD   12.5 mg at 09/22/18 0914  . chlorhexidine (PERIDEX) 0.12 % solution 15 mL  15 mL Mouth Rinse BID Niu, Xilin, MD      . docusate sodium (COLACE) capsule 100 mg  100 mg Oral Daily PRN Niu, Xilin, MD      . feeding supplement (ENSURE ENLIVE) (ENSURE ENLIVE) liquid 237 mL  237 mL Oral TID BM Niu, Xilin, MD      . furosemide (LASIX) tablet 20 mg  20 mg Oral Daily Niu, Xilin, MD   20 mg at 09/22/18 0914  . hydrALAZINE (APRESOLINE) injection 5 mg  5 mg Intravenous Q2H PRN Niu, Xilin, MD      . insulin aspart (novoLOG) injection 0-5 Units  0-5 Units Subcutaneous QHS Niu, Xilin, MD      .  insulin aspart (novoLOG) injection 0-9 Units  0-9 Units Subcutaneous TID WC Niu, Xilin, MD   2 Units at 09/22/18 0755  . lisinopril (PRINIVIL,ZESTRIL) tablet 2.5 mg  2.5 mg Oral Daily Niu, Xilin, MD   2.5 mg at 09/22/18 0914  . MEDLINE mouth rinse  15 mL Mouth Rinse q12n4p Niu, Xilin, MD      .   morphine 2 MG/ML injection 2 mg  2 mg Intravenous Q4H PRN Niu, Xilin, MD      . multivitamin with minerals tablet 1 tablet  1 tablet Oral Daily Niu, Xilin, MD   1 tablet at 09/22/18 0914  . ondansetron (ZOFRAN) injection 4 mg  4 mg Intravenous Q8H PRN Niu, Xilin, MD   4 mg at 09/22/18 1115  . pantoprazole (PROTONIX) EC tablet 40 mg  40 mg Oral Daily Niu, Xilin, MD   40 mg at 09/22/18 0914  . [START ON 09/23/2018] pneumococcal 23 valent vaccine (PNU-IMMUNE) injection 0.5 mL  0.5 mL Intramuscular Tomorrow-1000 Niu, Xilin, MD      . Warfarin - Pharmacist Dosing Inpatient   Does not apply q1800 Green, Terri L, RPH      . zolpidem (AMBIEN) tablet 5 mg  5 mg Oral QHS PRN Niu, Xilin, MD        Allergies as of 09/21/2018  . (No Known Allergies)    Family History  Problem Relation Age of Onset  . Diabetes Mother   . Hypertension Mother   . Heart disease Father   . Heart attack Father   . Hypertension Father   . Diabetes Father   . Ovarian cancer Sister   . Liver cancer Sister   . Sickle cell anemia Daughter   . Schizophrenia Daughter   . Hypertension Sister   . Hypertension Brother   . Hypertension Daughter   . Kidney disease Sister        x2  . Kidney disease Brother   . Stroke Neg Hx   . Esophageal cancer Neg Hx   . Colon cancer Neg Hx   . Colon polyps Neg Hx     Social History   Socioeconomic History  . Marital status: Legally Separated    Spouse name: Not on file  . Number of children: 2  . Years of education: 9 TH  . Highest education level: Not on file  Occupational History  . Occupation: Disabled  Social Needs  . Financial resource strain: Not on file  . Food insecurity:     Worry: Not on file    Inability: Not on file  . Transportation needs:    Medical: Not on file    Non-medical: Not on file  Tobacco Use  . Smoking status: Never Smoker  . Smokeless tobacco: Never Used  Substance and Sexual Activity  . Alcohol use: No    Alcohol/week: 0.0 standard drinks  . Drug use: No  . Sexual activity: Not on file  Lifestyle  . Physical activity:    Days per week: Not on file    Minutes per session: Not on file  . Stress: Not on file  Relationships  . Social connections:    Talks on phone: Not on file    Gets together: Not on file    Attends religious service: Not on file    Active member of club or organization: Not on file    Attends meetings of clubs or organizations: Not on file    Relationship status: Not on file  . Intimate partner violence:    Fear of current or ex partner: Not on file    Emotionally abused: Not on file    Physically abused: Not on file    Forced sexual activity: Not on file  Other Topics Concern  . Not on file  Social History Narrative   Patient is married with 2 children.   Patient is right handed.     Patient has 9 th grade education.   Patient drinks 2 cups daily.    Review of Systems: ROS is O/W negative except as mentioned in HPI.  Physical Exam: Vital signs in last 24 hours: Temp:  [97.4 F (36.3 C)-98.9 F (37.2 C)] 97.4 F (36.3 C) (10/29 0657) Pulse Rate:  [65-210] 117 (10/29 0713) Resp:  [16-25] 20 (10/29 0713) BP: (127-153)/(89-109) 147/97 (10/29 0713) SpO2:  [97 %-100 %] 97 % (10/29 0713) Weight:  [50.3 kg-51.7 kg] 50.3 kg (10/29 0102) Last BM Date: 09/21/18 General:  Alert, Well-developed, well-nourished, pleasant and cooperative in NAD Head:  Normocephalic and atraumatic. Eyes:  Sclera clear, no icterus.   Conjunctiva pink. Ears:  Normal auditory acuity. Mouth:  No deformity or lesions.   Lungs:  Clear throughout to auscultation.   No wheezes, crackles, or rhonchi.  Heart:  Regular rate and rhythm; no  murmurs, clicks, rubs,  or gallops. Abdomen:  Soft,nontender, BS active,nonpalp mass or hsm.   Rectal:  Deferred  Msk:  Symmetrical without gross deformities. Pulses:  Normal pulses noted. Extremities:  Without clubbing or edema. Neurologic:  Alert and  oriented x4;  grossly normal neurologically. Skin:  Intact without significant lesions or rashes. Psych:  Alert and cooperative. Normal mood and affect.  Lab Results: Recent Labs    09/21/18 1836 09/22/18 0136  WBC 7.7 8.9  HGB 14.2 12.2  HCT 45.2 39.0  PLT 279 249   BMET Recent Labs    09/21/18 1836 09/22/18 0136  NA 138 137  K 3.8 4.2  CL 101 105  CO2 26 23  GLUCOSE 197* 180*  BUN 13 13  CREATININE 0.85 0.78  CALCIUM 9.6 8.8*   LFT Recent Labs    09/22/18 0136  PROT 6.5  ALBUMIN 3.3*  AST 70*  ALT 67*  ALKPHOS 51  BILITOT 1.6*   PT/INR Recent Labs    09/22/18 0136  LABPROT 20.5*  INR 1.78   Studies/Results: Nm Hepatobiliary Liver Func  Result Date: 09/22/2018 CLINICAL DATA:  Upper abdominal pain EXAM: NUCLEAR MEDICINE HEPATOBILIARY IMAGING VIEWS: Anterior right upper quadrant. Patient was unable to remain stationary to allow for ejection fraction assessment. RADIOPHARMACEUTICALS:  5.35 mCi Tc-99m  Choletec IV COMPARISON:  None. FINDINGS: Liver uptake of radiotracer is unremarkable. There is visualization of gallbladder and small bowel, indicating patency of the cystic and common bile ducts. Ejection fraction value not determine due to patient's inability to remains stationary and proceed with this study. IMPRESSION: Patency of the cystic and common bile ducts, evidenced by visualization of gallbladder and small bowel. Electronically Signed   By: William  Woodruff III M.D.   On: 09/22/2018 09:02   Us Abdomen Limited  Result Date: 09/21/2018 CLINICAL DATA:  Abdominal pain with vomiting and elevated LFTs as well as elevated bilirubin. EXAM: ULTRASOUND ABDOMEN LIMITED RIGHT UPPER QUADRANT COMPARISON:  CT  09/01/2018 FINDINGS: Gallbladder: No evidence of gallstones or sludge. Mild wall thickening measuring 5.7 mm. Possible tiny amount of pericholecystic fluid. Common bile duct: Diameter: 3.1 mm. Liver: No focal lesion identified. Within normal limits in parenchymal echogenicity. Portal vein is patent on color Doppler imaging with normal direction of blood flow towards the liver. Right pleural effusion. IMPRESSION: Nonspecific gallbladder wall thickening. No evidence of gallstones or sludge. HIDA scan may be helpful to assess gallbladder function. Normal liver and biliary tree. Right pleural effusion. Electronically Signed   By: Daniel  Boyle M.D.   On: 09/21/2018 22:20   IMPRESSION:  *Nausea, vomiting, abdominal pain and   elevated LFT's:  ? If these are related or not.  Extensive evaluation thus far has been unrevealing.  While I was in the room her "vomiting" was actually "spitting" of clear saliva material.  She does not speak much or give history.  There has been mention of gastroparesis.  She did have abnormal bile duct imaging in the past to which she was referred to [facility] and never went there, but LFT's had been normal in the past.  Elevation currently is very mild. *CHF with EF of 20-25% *LV mural thrombus on coumadin:  INR 1.78 *History of CVA with dysphagia  PLAN: *She has never had EGD so we are going to start with that. *They have her on pantoprazole 40 mg daily here but was not on PPI at home.  I am going to increase to BID for now. *In regards to elevated LFT's, will fractionate her bili with hepatic function panel.  Will check CK, ANA, AMA, ASMA, hep B surface Ag, hep B surface Ab, and Hep C Ab.   Jessica D. Zehr  09/22/2018, 12:21 PM     

## 2018-09-23 ENCOUNTER — Encounter (HOSPITAL_COMMUNITY): Payer: Self-pay | Admitting: Emergency Medicine

## 2018-09-23 ENCOUNTER — Observation Stay (HOSPITAL_COMMUNITY): Payer: Medicare HMO | Admitting: Anesthesiology

## 2018-09-23 ENCOUNTER — Encounter (HOSPITAL_COMMUNITY)
Admission: EM | Disposition: A | Discharge status: Home / Self Care | Payer: Self-pay | Source: Home / Self Care | Attending: Emergency Medicine

## 2018-09-23 DIAGNOSIS — I5042 Chronic combined systolic (congestive) and diastolic (congestive) heart failure: Secondary | ICD-10-CM

## 2018-09-23 DIAGNOSIS — I236 Thrombosis of atrium, auricular appendage, and ventricle as current complications following acute myocardial infarction: Secondary | ICD-10-CM

## 2018-09-23 DIAGNOSIS — E785 Hyperlipidemia, unspecified: Secondary | ICD-10-CM | POA: Diagnosis not present

## 2018-09-23 DIAGNOSIS — K2950 Unspecified chronic gastritis without bleeding: Secondary | ICD-10-CM | POA: Diagnosis not present

## 2018-09-23 DIAGNOSIS — I10 Essential (primary) hypertension: Secondary | ICD-10-CM

## 2018-09-23 DIAGNOSIS — Z23 Encounter for immunization: Secondary | ICD-10-CM | POA: Diagnosis not present

## 2018-09-23 DIAGNOSIS — R1011 Right upper quadrant pain: Secondary | ICD-10-CM | POA: Diagnosis not present

## 2018-09-23 DIAGNOSIS — R1010 Upper abdominal pain, unspecified: Secondary | ICD-10-CM | POA: Diagnosis not present

## 2018-09-23 DIAGNOSIS — E1159 Type 2 diabetes mellitus with other circulatory complications: Secondary | ICD-10-CM

## 2018-09-23 DIAGNOSIS — R112 Nausea with vomiting, unspecified: Secondary | ICD-10-CM | POA: Diagnosis not present

## 2018-09-23 HISTORY — PX: ESOPHAGOGASTRODUODENOSCOPY (EGD) WITH PROPOFOL: SHX5813

## 2018-09-23 HISTORY — PX: BIOPSY: SHX5522

## 2018-09-23 LAB — CBC WITH DIFFERENTIAL/PLATELET
Abs Immature Granulocytes: 0.03 10*3/uL (ref 0.00–0.07)
BASOS ABS: 0.1 10*3/uL (ref 0.0–0.1)
BASOS PCT: 1 %
Eosinophils Absolute: 0 10*3/uL (ref 0.0–0.5)
Eosinophils Relative: 0 %
HCT: 36.2 % (ref 36.0–46.0)
Hemoglobin: 11.5 g/dL — ABNORMAL LOW (ref 12.0–15.0)
IMMATURE GRANULOCYTES: 0 %
Lymphocytes Relative: 16 %
Lymphs Abs: 1.2 10*3/uL (ref 0.7–4.0)
MCH: 21.5 pg — AB (ref 26.0–34.0)
MCHC: 31.8 g/dL (ref 30.0–36.0)
MCV: 67.5 fL — ABNORMAL LOW (ref 80.0–100.0)
Monocytes Absolute: 0.6 10*3/uL (ref 0.1–1.0)
Monocytes Relative: 9 %
NEUTROS PCT: 74 %
NRBC: 0 % (ref 0.0–0.2)
Neutro Abs: 5.3 10*3/uL (ref 1.7–7.7)
Platelets: 222 10*3/uL (ref 150–400)
RBC: 5.36 MIL/uL — AB (ref 3.87–5.11)
RDW: 15 % (ref 11.5–15.5)
WBC: 7.2 10*3/uL (ref 4.0–10.5)

## 2018-09-23 LAB — GLUCOSE, CAPILLARY
GLUCOSE-CAPILLARY: 157 mg/dL — AB (ref 70–99)
GLUCOSE-CAPILLARY: 174 mg/dL — AB (ref 70–99)
GLUCOSE-CAPILLARY: 205 mg/dL — AB (ref 70–99)
GLUCOSE-CAPILLARY: 179 mg/dL — AB (ref 70–99)

## 2018-09-23 LAB — HEPATITIS C ANTIBODY: HCV Ab: <0.1 s/co ratio (ref 0.0–0.9)

## 2018-09-23 LAB — HEPATITIS B SURFACE ANTIBODY, QUANTITATIVE: Hepatitis B-Post: 56.6 m[IU]/mL (ref >9.9)

## 2018-09-23 LAB — PROTIME-INR
INR: 2.23
Prothrombin Time: 24.4 seconds — ABNORMAL HIGH (ref 11.4–15.2)

## 2018-09-23 LAB — BASIC METABOLIC PANEL
Anion gap: 9 (ref 5–15)
BUN: 10 mg/dL (ref 8–23)
CALCIUM: 8.7 mg/dL — AB (ref 8.9–10.3)
CO2: 23 mmol/L (ref 22–32)
Chloride: 106 mmol/L (ref 98–111)
Creatinine, Ser: 0.8 mg/dL (ref 0.44–1.00)
GFR calc non Af Amer: >60 mL/min (ref >60)
Glucose, Bld: 202 mg/dL — ABNORMAL HIGH (ref 70–99)
Potassium: 3.2 mmol/L — ABNORMAL LOW (ref 3.5–5.1)
SODIUM: 138 mmol/L (ref 135–145)

## 2018-09-23 LAB — ANTI-SMOOTH MUSCLE ANTIBODY, IGG: F-Actin IgG: 7 Units (ref 0–19)

## 2018-09-23 LAB — HEPATITIS B SURFACE ANTIGEN: HEP B S AG: NEGATIVE

## 2018-09-23 LAB — MITOCHONDRIAL ANTIBODIES: Mitochondrial M2 Ab, IgG: <20 Units (ref 0.0–20.0)

## 2018-09-23 LAB — ANTINUCLEAR ANTIBODIES, IFA: ANA Ab, IFA: NEGATIVE

## 2018-09-23 SURGERY — ESOPHAGOGASTRODUODENOSCOPY (EGD) WITH PROPOFOL
Anesthesia: Monitor Anesthesia Care

## 2018-09-23 MED ORDER — POTASSIUM CHLORIDE 10 MEQ/100ML IV SOLN
INTRAVENOUS | Status: AC
  Administered 2018-09-23: 10 meq via INTRAVENOUS
  Filled 2018-09-23: qty 100

## 2018-09-23 MED ORDER — PHENYLEPHRINE 40 MCG/ML (10ML) SYRINGE FOR IV PUSH (FOR BLOOD PRESSURE SUPPORT)
PREFILLED_SYRINGE | INTRAVENOUS | Status: DC | PRN
  Administered 2018-09-23: 80 ug via INTRAVENOUS

## 2018-09-23 MED ORDER — GUAIFENESIN-DM 100-10 MG/5ML PO SYRP
5.0000 mL | ORAL_SOLUTION | ORAL | Status: DC | PRN
  Administered 2018-09-23: 5 mL via ORAL
  Filled 2018-09-23: qty 10

## 2018-09-23 MED ORDER — PROPOFOL 10 MG/ML IV BOLUS
INTRAVENOUS | Status: DC | PRN
  Administered 2018-09-23: 20 mg via INTRAVENOUS
  Administered 2018-09-23: 30 mg via INTRAVENOUS

## 2018-09-23 MED ORDER — ESMOLOL HCL 100 MG/10ML IV SOLN
INTRAVENOUS | Status: DC | PRN
  Administered 2018-09-23: 20 mg via INTRAVENOUS

## 2018-09-23 MED ORDER — PROPOFOL 10 MG/ML IV BOLUS
INTRAVENOUS | Status: AC
  Filled 2018-09-23: qty 40

## 2018-09-23 MED ORDER — WARFARIN SODIUM 5 MG PO TABS
5.0000 mg | ORAL_TABLET | Freq: Once | ORAL | Status: AC
  Administered 2018-09-23: 5 mg via ORAL
  Filled 2018-09-23: qty 1

## 2018-09-23 MED ORDER — LACTATED RINGERS IV SOLN
INTRAVENOUS | Status: DC
  Administered 2018-09-23: 11:00:00 via INTRAVENOUS

## 2018-09-23 MED ORDER — POTASSIUM CHLORIDE 10 MEQ/100ML IV SOLN
10.0000 meq | INTRAVENOUS | Status: AC
  Administered 2018-09-23 (×2): 10 meq via INTRAVENOUS
  Filled 2018-09-23: qty 100

## 2018-09-23 MED ORDER — PROPOFOL 500 MG/50ML IV EMUL
INTRAVENOUS | Status: DC | PRN
  Administered 2018-09-23: 75 ug/kg/min via INTRAVENOUS

## 2018-09-23 MED ORDER — SODIUM CHLORIDE 0.9 % IV SOLN: INTRAVENOUS | Status: DC

## 2018-09-23 SURGICAL SUPPLY — 15 items

## 2018-09-23 NOTE — Progress Notes (Deleted)
Cardiology Office Note   Date:  09/23/2018   ID:  Kristen Lozano, DOB Apr 03, 1956, MRN [id number]  PCP:  Audley Hose, MD  Cardiologist: Dr.Hilty  No chief complaint on file.    History of Present Illness: Kristen Lozano is a 62 y.o. 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 Other history includes Type II Diabetes, and hyperlipidemia.   During admission BNP was elevated at 1.148, CT scan found LLQ infectious and inflammatory changes. She was placed on empiric antibiotics. CXR did show evidence of pneumonia with small moderate bilateral pleural effusions, cardiomegaly and vascular congestion.   Echocardiogram revealed significantly reduced LV fx with LVEF of 20%-25% (previous echo 35%). The patient was diuresed. She was to resume spironolactone 25 mg daily and metoprolol 75 mg BID, but ACE was not restarted. Consideration for OP ischemic work up will be considered. In the past, family had decided against this and wanted medical management only.    Past Medical History:  Diagnosis Date  . Allergic rhinitis    Requires cetirizine, singulair, and fluticasone.  . Anxiety    Has been on Xanax since 2009. Uses it for stress, anxiety, and insomnia. No contract yet.  . Arthritis   . CHF (congestive heart failure) (HCC)    EF 35% after stroke, presumed ischemic  . Chronic pain    Has OA of knees B. No Xrays in echart. Not requiring narcotics.  . Colitis 12/2017  . Depression   . Diabetes mellitus    Type 2, non insulin dependent. Was dx'd prior to 2008.  Marland Kitchen Hyperglycemia   . Hyperlipemia   . Hypertension   . Sickle cell trait ([place])   . Stroke [facility]) 09/05/14   Dominant left MCA infarcts secondary to unknown embolic source     Past Surgical History:  Procedure Laterality Date  . TEE WITHOUT  CARDIOVERSION N/A 09/06/2014   Procedure: TRANSESOPHAGEAL ECHOCARDIOGRAM (TEE);  Surgeon: Pixie Casino, MD;  Location: [facility] ENDOSCOPY;  Service: Cardiovascular;  Laterality: N/A;     No current facility-administered medications for this visit.    No current outpatient medications on file.   Facility-Administered Medications Ordered in Other Visits  Medication Dose Route Frequency Provider Last Rate Last Dose  . 0.9 %  sodium chloride infusion   Intravenous Continuous Zehr, Jessica D, PA-C      . 0.9 %  sodium chloride infusion   Intravenous Continuous Arrien, Jimmy Picket, MD   Stopped at 09/23/18 1012  . [MAR Hold] aspirin EC tablet 81 mg  81 mg Oral Daily Ivor Costa, MD   Stopped at 09/23/18 [phone]  . [MAR Hold] atorvastatin (LIPITOR) tablet 20 mg  20 mg Oral q1800 Ivor Costa, MD   20 mg at 09/22/18 1716  . [MAR Hold] B-complex with vitamin C tablet 1 tablet  1 tablet Oral Daily Ivor Costa, MD   Stopped at 09/23/18 [phone]  . [MAR Hold] carvedilol (COREG) tablet 12.5 mg  12.5 mg Oral BID WC Ivor Costa, MD   Stopped at 09/23/18 [phone]  . [MAR Hold] chlorhexidine (PERIDEX) 0.12 % solution 15 mL  15 mL Mouth Rinse BID Ivor Costa, MD   Stopped at 09/23/18 [phone]  . [MAR Hold] docusate sodium (COLACE) capsule 100 mg  100 mg Oral Daily PRN Ivor Costa, MD      . [  MAR Hold] famotidine (PEPCID) IVPB 20 mg premix  20 mg Intravenous Q12H Arrien, Jimmy Picket, MD   Stopped at 09/22/18 2235  . [MAR Hold] feeding supplement (ENSURE ENLIVE) (ENSURE ENLIVE) liquid 237 mL  237 mL Oral TID BM Ivor Costa, MD   Stopped at 09/23/18 [phone]  . [MAR Hold] hydrALAZINE (APRESOLINE) injection 5 mg  5 mg Intravenous Q2H PRN Ivor Costa, MD      . Doug Sou Hold] insulin aspart (novoLOG) injection 0-5 Units  0-5 Units Subcutaneous QHS Ivor Costa, MD      . Doug Sou Hold] insulin aspart (novoLOG) injection 0-9 Units  0-9 Units Subcutaneous TID WC Ivor Costa, MD   2 Units at 09/23/18 [phone]  . lactated ringers infusion   Intravenous  Continuous Yetta Flock, MD 125 mL/hr at 09/23/18 1036    . [MAR Hold] lisinopril (PRINIVIL,ZESTRIL) tablet 2.5 mg  2.5 mg Oral Daily Ivor Costa, MD   Stopped at 09/23/18 [phone]  . [facility] Hold] MEDLINE mouth rinse  15 mL Mouth Rinse q12n4p Ivor Costa, MD   15 mL at 09/22/18 1638  . [MAR Hold] morphine 2 MG/ML injection 2 mg  2 mg Intravenous Q4H PRN Ivor Costa, MD      . Doug Sou Hold] multivitamin with minerals tablet 1 tablet  1 tablet Oral Daily Ivor Costa, MD   Stopped at 09/23/18 [phone]  . [MAR Hold] pneumococcal 23 valent vaccine (PNU-IMMUNE) injection 0.5 mL  0.5 mL Intramuscular Tomorrow-1000 Ivor Costa, MD      . Doug Sou Hold] promethazine (PHENERGAN) injection 25 mg  25 mg Intravenous Q6H PRN Arrien, Jimmy Picket, MD   25 mg at 09/23/18 0309  . [MAR Hold] warfarin (COUMADIN) tablet 5 mg  5 mg Oral ONCE-1800 Pham, Anh P, RPH      . [MAR Hold] Warfarin - Pharmacist Dosing Inpatient   Does not apply q1800 Minda Ditto, RPH      . [MAR Hold] zolpidem (AMBIEN) tablet 5 mg  5 mg Oral QHS PRN Ivor Costa, MD        Allergies:   Patient has no known allergies.    Social History:  The patient  reports that she has never smoked. She has never used smokeless tobacco. She reports that she does not drink alcohol or use drugs.   Family History:  The patient's family history includes Diabetes in her father and mother; Heart attack in her father; Heart disease in her father; Hypertension in her brother, daughter, father, mother, and sister; Kidney disease in her brother and sister; Liver cancer in her sister; Ovarian cancer in her sister; Schizophrenia in her daughter; Sickle cell anemia in her daughter.    ROS: All other systems are reviewed and negative. Unless otherwise mentioned in H&P    PHYSICAL EXAM: VS:  There were no vitals taken for this visit. , BMI There is no height or weight on file to calculate BMI. GEN: Well nourished, well developed, in no acute distress HEENT: normal Neck: no  JVD, carotid bruits, or masses Cardiac: ***RRR; no murmurs, rubs, or gallops,no edema  Respiratory:  Clear to auscultation bilaterally, normal work of breathing GI: soft, nontender, nondistended, + BS MS: no deformity or atrophy Skin: warm and dry, no rash Neuro:  Strength and sensation are intact Psych: euthymic mood, full affect   EKG:  EKG {ACTION; IS/IS YSA:[id number]} ordered today. The ekg ordered today demonstrates ***   Recent Labs: 08/16/2018: TSH 0.900 09/08/2018: Magnesium 1.5 09/22/2018: ALT 66; B Natriuretic Peptide  1,495.9 09/23/2018: BUN 10; Creatinine, Ser 0.80; Hemoglobin 11.5; Platelets 222; Potassium 3.2; Sodium 138    Lipid Panel    Component Value Date/Time   CHOL 146 10/30/2016 0240   TRIG 104 10/30/2016 0240   HDL 44 10/30/2016 0240   CHOLHDL 3.3 10/30/2016 0240   VLDL 21 10/30/2016 0240   LDLCALC 81 10/30/2016 0240      Wt Readings from Last 3 Encounters:  09/23/18 110 lb 3.7 oz (50 kg)  09/09/18 106 lb 14.8 oz (48.5 kg)  09/01/18 110 lb (49.9 kg)      Other studies Reviewed: Study Conclusions-Echocardiogram 08/10/2018  - Left ventricle: The cavity size was normal. Wall thickness was normal. Systolic function was severely reduced. The estimated ejection fraction was in the range of 20% to 25%. Diffuse hypokinesis. The study is not technically sufficient to allow evaluation of LV diastolic function. - Ventricular septum: Septal motion showed abnormal function and dyssynergy. - Aortic valve: There was trivial regurgitation. - Mitral valve: There was moderate regurgitation. - Left atrium: The atrium was mildly dilated. - Tricuspid valve: There was trivial regurgitation. - Pulmonary arteries: Systolic pressure was moderately increased. PA peak pressure: 53 mm Hg (S). - Pericardium, extracardiac: A trivial pericardial effusion was identified. There was a left pleural effusion.  Impressions:  - EF is reduced when compared to  prior study (40%)ASSESSMENT AND PLAN:  1.  ***   Current medicines are reviewed at length with the patient today.    Labs/ tests ordered today include: *** Phill Myron. [place] Pugh, ANP, AACC   09/23/2018 11:09 AM    [place] [address] Office [phone] Fax [phone]

## 2018-09-23 NOTE — Anesthesia Procedure Notes (Signed)
Procedure Name: MAC Date/Time: 09/23/2018 11:34 AM Performed by: Lollie Sails, CRNA Pre-anesthesia Checklist: Patient identified, Suction available, Emergency Drugs available and Patient being monitored Oxygen Delivery Method: Nasal cannula

## 2018-09-23 NOTE — Op Note (Signed)
[facility] Patient Name: Kristen Lozano Procedure Date: 09/23/2018 MRN: [id number] Attending MD: Carlota Raspberry. Havery Moros , MD Date of Birth: [birth year] CSN: [id number] Age: 62 Admit Type: Inpatient Procedure:                Upper GI endoscopy Indications:              Upper abdominal pain, Nausea with vomiting without                            clear cause - negative [place], negative HIDA, negative                            CT Providers:                Carlota Raspberry. Havery Moros, MD, Truddie Coco, RN, William Dalton, Technician Referring MD:              Medicines:                Monitored Anesthesia Care Complications:            No immediate complications. Estimated blood loss:                            Minimal. Estimated Blood Loss:     Estimated blood loss was minimal. Procedure:                Pre-Anesthesia Assessment:                           - Prior to the procedure, a History and Physical                            was performed, and patient medications and                            allergies were reviewed. The patient's tolerance of                            previous anesthesia was also reviewed. The risks                            and benefits of the procedure and the sedation                            options and risks were discussed with the patient.                            All questions were answered, and informed consent                            was obtained. Prior Anticoagulants: The patient has                            taken Coumadin (  warfarin), last dose was 2 days                            prior to procedure. ASA Grade Assessment: III - A                            patient with severe systemic disease. After                            reviewing the risks and benefits, the patient was                            deemed in satisfactory condition to undergo the                            procedure.  After obtaining informed consent, the endoscope was                            passed under direct vision. Throughout the                            procedure, the patient's blood pressure, pulse, and                            oxygen saturations were monitored continuously. The                            GIF-H190 ([id number]) Olympus adult endoscope was                            introduced through the mouth, and advanced to the                            second part of duodenum. The upper GI endoscopy was                            accomplished without difficulty. The patient                            tolerated the procedure well. Scope In: Scope Out: Findings:      Esophagogastric landmarks were identified: the Z-line was found at 41       cm, the gastroesophageal junction was found at 41 cm and the upper       extent of the gastric folds was found at 41 cm from the incisors.      The Z-line was irregular with a slight extension of a narrow tongue of       salmon colored mucosa, roughly 1cm in length. Biopsies were taken with a       cold forceps for histology.      The exam of the esophagus was otherwise normal.      The entire examined stomach was normal other than a few diminutive       benign appearing gastric polyps in the gastric body. Biopsies were taken  from the antrum, body, and incisura with a cold forceps for Helicobacter       pylori testing.      The duodenal bulb and second portion of the duodenum were normal. Impression:               - Esophagogastric landmarks identified.                           - Z-line irregular. Biopsied.                           - Normal esophagus.                           - Normal stomach. Biopsied to rule out H pylori.                           - Normal duodenal bulb and second portion of the                            duodenum.                           No clear cause for symptoms based on EGD. Moderate Sedation:      No moderate  sedation, case performed with MAC Recommendation:           - Return patient to hospital ward for ongoing care.                           - Advance diet as tolerated.                           - Continue present medications. Phenergan appears                            to be working well for her                           - Await pathology results.                           - Gastric emptying study to assess for gastroparesis                           - Symptoms could be medication related (Trulicity                            listed on med list - would hold this if taking as                            this can cause nausea/vomiting) Procedure Code(s):        --- Professional ---                           [phone], Esophagogastroduodenoscopy, flexible,  transoral; with biopsy, single or multiple Diagnosis Code(s):        --- Professional ---                           K22.8, Other specified diseases of esophagus                           R10.10, Upper abdominal pain, unspecified                           R11.2, Nausea with vomiting, unspecified CPT copyright 2018 American Medical Association. All rights reserved. The codes documented in this report are preliminary and upon coder review may  be revised to meet current compliance requirements. Remo Lipps P. , MD 09/23/2018 11:57:18 AM This report has been signed electronically. Number of Addenda: 0

## 2018-09-23 NOTE — Interval H&P Note (Signed)
History and Physical Interval Note:  09/23/2018 11:28 AM  Kristen Lozano  has presented today for surgery, with the diagnosis of Nausea, vomiting, upper abdominal pain  The various methods of treatment have been discussed with the patient and family. After consideration of risks, benefits and other options for treatment, the patient has consented to  Procedure(s): ESOPHAGOGASTRODUODENOSCOPY (EGD) WITH PROPOFOL (N/A) as a surgical intervention .  The patient's history has been reviewed, patient examined, no change in status, stable for surgery.  I have reviewed the patient's chart and labs.  Questions were answered to the patient's satisfaction.     [place]

## 2018-09-23 NOTE — Progress Notes (Signed)
ANTICOAGULATION CONSULT NOTE - Follow Up Consult  Pharmacy Consult for warfarin Indication: hx LV mural thrombus and CVA  No Known Allergies  Patient Measurements: Height: 5\' 3"  (160 cm) Weight: 110 lb 14.3 oz (50.3 kg) IBW/kg (Calculated) : 52.4 Heparin Dosing Weight:   Vital Signs: Temp: 98.8 F (37.1 C) (10/29 2022) Temp Source: Oral (10/29 2022) BP: 119/81 (10/29 2022) Pulse Rate: 118 (10/29 2022)  Labs: Recent Labs    09/21/18 1836 09/22/18 0136 09/22/18 1554 09/23/18 0427  HGB 14.2 12.2  --  11.5*  HCT 45.2 39.0  --  36.2  PLT 279 249  --  222  LABPROT  --  20.5*  --  24.4*  INR  --  1.78  --  2.23  CREATININE 0.85 0.78  --  0.80  CKTOTAL  --   --  112  --     Estimated Creatinine Clearance: 57.2 mL/min (by C-G formula based on SCr of 0.8 mg/dL).   Medications:  PTA warfarin regimen: 5 mg daily except 7.5 mg on Monday and Friday  Assessment: Patient is a 62 y.o F with hx CVA and LV mural thrombus on warfarin PTA presented to the ED on 09/21/18 with c/o abd pain and n/v.  Warfarin resumed on admission.  Today, 09/23/2018: - INR is therapeutic today at 2.23 - Hgb down slightly at 11.5, plts ok - no bleeding documented - no significant drug-drug intxns - plan for possible EGD today. Per GI's note on 10/29, ok to continue with warfarin  Goal of Therapy:  INR 2-3 Monitor platelets by anticoagulation protocol: Yes   Plan:  - warfarin 5 mg PO x1 today - monitor for s/s bleeding  Theadore Blunck P 09/23/2018,8:15 AM

## 2018-09-23 NOTE — Progress Notes (Signed)
[facility]  PROGRESS NOTE  Kristen Lozano VQX:[id number] DOB: [birth year] DOA: 09/21/2018 PCP: Audley Hose, MD   Brief HPI:   62 year old female came with abdominal pain, past medical history of hypertension, dyslipidemia, type 2 diabetes mellitus, GERD, diastolic heart failure also has sickle cell trait with left ventricle thrombus.  Patient has persistent symptoms of nausea vomiting abdominal pain in the lower quadrant.  GI was consulted for endoscopy.    Subjective   Patient seen and examined, seen prior to going to EGD.  Denies abdominal pain   Assessment/Plan:     1. Recurrent nausea vomiting abdominal pain-unclear etiology, patient underwent EGD today, no significant abnormality noted on the EGD.  GI recommends gastric emptying study.  Will order gastric emptying study for tomorrow morning.  Keep n.p.o. after midnight.  2. Hypokalemia-we will replace potassium and check BMP in am.  3. Hypertension-blood pressure stable, continue carvedilol, lisinopril.  4. Chronic systolic heart failure, LV thrombus-no signs of fluid overload.  Ejection fraction is 20 to 25%, diffuse hypokinesis systolic function severely reduced.  Continue warfarin, Lasix on hold.  5. Diabetes mellitus-continue sliding scale insulin with NovoLog  6. Dyslipidemia-continue atorvastatin.  7. History of CVA-continue atorvastatin, aspirin     CBG: Recent Labs  Lab 09/22/18 1658 09/22/18 1715 09/22/18 2023 09/23/18 0756 09/23/18 1034  GLUCAP 155* 141* 193* 157* 174*    CBC: Recent Labs  Lab 09/21/18 1836 09/22/18 0136 09/23/18 0427  WBC 7.7 8.9 7.2  NEUTROABS  --   --  5.3  HGB 14.2 12.2 11.5*  HCT 45.2 39.0 36.2  MCV 68.3* 69.1* 67.5*  PLT 279 249 003    Basic Metabolic Panel: Recent Labs  Lab 09/21/18 1836 09/22/18 0136 09/23/18 0427  NA 138 137 138  K 3.8 4.2 3.2*  CL 101 105 106  CO2 26 23 23   GLUCOSE 197* 180* 202*  BUN 13 13 10   CREATININE 0.85 0.78 0.80   CALCIUM 9.6 8.8* 8.7*     DVT prophylaxis: Warfarin  Code Status: Full code  Family Communication: No family at bedside  Disposition Plan: likely home when medically ready for discharge   Consultants:  GI  Procedures:  EGD   Antibiotics:   Anti-infectives (From admission, onward)   None       Objective   Vitals:   09/23/18 1200 09/23/18 1210 09/23/18 1220 09/23/18 1303  BP: 107/67 133/84 131/88 (!) 136/94  Pulse: (!) 103 (!) 112 (!) 118 (!) 122  Resp: 17 20 (!) 24 18  Temp:  97.7 F (36.5 C)  (!) 97.3 F (36.3 C)  TempSrc:    Oral  SpO2: 98% 100% 100% 94%  Weight:      Height:        Intake/Output Summary (Last 24 hours) at 09/23/2018 1516 Last data filed at 09/23/2018 1400 Gross per 24 hour  Intake 1897.34 ml  Output -  Net 1897.34 ml   Filed Weights   09/21/18 1712 09/22/18 0102 09/23/18 1027  Weight: 51.7 kg 50.3 kg 50 kg     Physical Examination:    General: Appears in no acute distress  Cardiovascular: S1-S2, regular, no murmurs auscultated  Respiratory: Clear to auscultation bilaterally  Abdomen: Soft, nontender, no organomegaly  Extremities: No edema of the lower extremities  Neurologic: Alert, oriented x3, no focal deficit noted     Data Reviewed: I have personally reviewed following labs and imaging studies   No results found for this or any previous visit (  from the past 240 hour(s)).   Liver Function Tests: Recent Labs  Lab 09/21/18 1836 09/22/18 0136 09/22/18 1554  AST 102* 70* 57*  ALT 85* 67* 66*  ALKPHOS 65 51 51  BILITOT 1.5* 1.6* 1.5*  PROT 7.7 6.5 6.7  ALBUMIN 4.0 3.3* 3.5   Recent Labs  Lab 09/21/18 1836  LIPASE 30   No results for input(s): AMMONIA in the last 168 hours.  Cardiac Enzymes: Recent Labs  Lab 09/22/18 1554  CKTOTAL 112   BNP (last 3 results) Recent Labs    09/01/18 1600 09/08/18 0240 09/22/18 0136  BNP 1,235.6* 493.0* 1,495.9*    ProBNP (last 3 results) No results  for input(s): PROBNP in the last 8760 hours.    Studies: Nm Hepatobiliary Liver Func  Result Date: 09/22/2018 CLINICAL DATA:  Upper abdominal pain EXAM: NUCLEAR MEDICINE HEPATOBILIARY IMAGING VIEWS: Anterior right upper quadrant. Patient was unable to remain stationary to allow for ejection fraction assessment. RADIOPHARMACEUTICALS:  5.35 mCi Tc-69m Choletec IV COMPARISON:  None. FINDINGS: Liver uptake of radiotracer is unremarkable. There is visualization of gallbladder and small bowel, indicating patency of the cystic and common bile ducts. Ejection fraction value not determine due to patient's inability to remains stationary and proceed with this study. IMPRESSION: Patency of the cystic and common bile ducts, evidenced by visualization of gallbladder and small bowel. Electronically Signed   By: WLowella GripIII M.D.   On: 09/22/2018 09:02   UKoreaAbdomen Limited  Result Date: 09/21/2018 CLINICAL DATA:  Abdominal pain with vomiting and elevated LFTs as well as elevated bilirubin. EXAM: ULTRASOUND ABDOMEN LIMITED RIGHT UPPER QUADRANT COMPARISON:  CT 09/01/2018 FINDINGS: Gallbladder: No evidence of gallstones or sludge. Mild wall thickening measuring 5.7 mm. Possible tiny amount of pericholecystic fluid. Common bile duct: Diameter: 3.1 mm. Liver: No focal lesion identified. Within normal limits in parenchymal echogenicity. Portal vein is patent on color Doppler imaging with normal direction of blood flow towards the liver. Right pleural effusion. IMPRESSION: Nonspecific gallbladder wall thickening. No evidence of gallstones or sludge. HIDA scan may be helpful to assess gallbladder function. Normal liver and biliary tree. Right pleural effusion. Electronically Signed   By: DMarin OlpM.D.   On: 09/21/2018 22:20    Scheduled Meds: . aspirin EC  81 mg Oral Daily  . atorvastatin  20 mg Oral q1800  . B-complex with vitamin C  1 tablet Oral Daily  . carvedilol  12.5 mg Oral BID WC  .  chlorhexidine  15 mL Mouth Rinse BID  . feeding supplement (ENSURE ENLIVE)  237 mL Oral TID BM  . insulin aspart  0-5 Units Subcutaneous QHS  . insulin aspart  0-9 Units Subcutaneous TID WC  . lisinopril  2.5 mg Oral Daily  . mouth rinse  15 mL Mouth Rinse q12n4p  . multivitamin with minerals  1 tablet Oral Daily  . pneumococcal 23 valent vaccine  0.5 mL Intramuscular Tomorrow-1000  . warfarin  5 mg Oral ONCE-1800  . Warfarin - Pharmacist Dosing Inpatient   Does not apply q1800    Admission status: Observation  Time spent: 20 min  [facility] Pager [phone] If 7PM-7AM, please contact night-coverage at [email or web address], Office  [phone] password [email or web address]  09/23/2018, 3:16 PM  LOS: 0 days

## 2018-09-23 NOTE — Transfer of Care (Signed)
Immediate Anesthesia Transfer of Care Note  Patient: Kristen Lozano  Procedure(s) Performed: ESOPHAGOGASTRODUODENOSCOPY (EGD) WITH PROPOFOL (N/A ) BIOPSY  Patient Location: PACU and Endoscopy Unit  Anesthesia Type:MAC  Level of Consciousness: sedated  Airway & Oxygen Therapy: Patient Spontanous Breathing and Patient connected to nasal cannula oxygen  Post-op Assessment: Report given to RN, Post -op Vital signs reviewed and stable and patient breathing well, BP increased with Phenylephrine 80 mcg IV  Post vital signs: Reviewed and stable  Last Vitals:  Vitals Value Taken Time  BP 99/56 09/23/2018 11:58 AM  Temp    Pulse 104 09/23/2018 11:59 AM  Resp 17 09/23/2018 11:59 AM  SpO2 97 % 09/23/2018 11:59 AM  Vitals shown include unvalidated device data.  Last Pain:  Vitals:   09/23/18 1027  TempSrc: Oral  PainSc:       Patients Stated Pain Goal: 0 (49/82/64 1583)  Complications: No apparent anesthesia complications

## 2018-09-23 NOTE — Care Management Obs Status (Signed)
[facility] NOTIFICATION   Patient Details  Name: KATESSA ATTRIDGE MRN: [id number] Date of Birth: 25-Oct-1956   Medicare Observation Status Notification Given:  Yes    Purcell Mouton, RN 09/23/2018, 3:42 PM

## 2018-09-23 NOTE — Anesthesia Preprocedure Evaluation (Signed)
Anesthesia Evaluation  Patient identified by MRN, date of birth, ID band Patient awake    Reviewed: Allergy & Precautions, NPO status , Patient's Chart, lab work & pertinent test results, reviewed documented beta blocker date and time   History of Anesthesia Complications Negative for: history of anesthetic complications  Airway Mallampati: IV  TM Distance: >3 FB Neck ROM: Full  Mouth opening: Limited Mouth Opening Comment: Poor effort by patient Dental  (+) Poor Dentition, Missing Denies loose teeth, :   Pulmonary    breath sounds clear to auscultation       Cardiovascular hypertension, Pt. on medications and Pt. on home beta blockers +CHF   Rhythm:Regular     Neuro/Psych PSYCHIATRIC DISORDERS Anxiety Depression CVA    GI/Hepatic Neg liver ROS, GERD  Medicated,  Endo/Other  diabetes, Type 2  Renal/GU negative Renal ROS     Musculoskeletal  (+) Arthritis ,   Abdominal   Peds  Hematology  (+) anemia ,   Anesthesia Other Findings 9/19 tte: Left ventricle: The cavity size was normal. Wall thickness was   normal. Systolic function was severely reduced. The estimated   ejection fraction was in the range of 20% to 25%. Diffuse   hypokinesis. The study is not technically sufficient to allow   evaluation of LV diastolic function. - Ventricular septum: Septal motion showed abnormal function and   dyssynergy. - Aortic valve: There was trivial regurgitation. - Mitral valve: There was moderate regurgitation. - Left atrium: The atrium was mildly dilated. - Tricuspid valve: There was trivial regurgitation. - Pulmonary arteries: Systolic pressure was moderately increased.   PA peak pressure: 53 mm Hg (S). - Pericardium, extracardiac: A trivial pericardial effusion was   identified. There was a left pleural effusion.  Reproductive/Obstetrics                             Anesthesia  Physical Anesthesia Plan  ASA: III  Anesthesia Plan: MAC   Post-op Pain Management:    Induction:   PONV Risk Score and Plan: 2 and Treatment may vary due to age or medical condition  Airway Management Planned: Nasal Cannula  Additional Equipment:   Intra-op Plan:   Post-operative Plan:   Informed Consent: I have reviewed the patients History and Physical, chart, labs and discussed the procedure including the risks, benefits and alternatives for the proposed anesthesia with the patient or authorized representative who has indicated his/her understanding and acceptance.   Dental advisory given  Plan Discussed with: CRNA  Anesthesia Plan Comments:         Anesthesia Quick Evaluation

## 2018-09-24 ENCOUNTER — Observation Stay (HOSPITAL_COMMUNITY): Payer: Medicare HMO

## 2018-09-24 ENCOUNTER — Encounter (HOSPITAL_COMMUNITY): Payer: Self-pay | Admitting: Gastroenterology

## 2018-09-24 DIAGNOSIS — I10 Essential (primary) hypertension: Secondary | ICD-10-CM | POA: Diagnosis not present

## 2018-09-24 DIAGNOSIS — I5042 Chronic combined systolic (congestive) and diastolic (congestive) heart failure: Secondary | ICD-10-CM | POA: Diagnosis not present

## 2018-09-24 DIAGNOSIS — R1010 Upper abdominal pain, unspecified: Secondary | ICD-10-CM | POA: Diagnosis not present

## 2018-09-24 DIAGNOSIS — R1011 Right upper quadrant pain: Secondary | ICD-10-CM | POA: Diagnosis not present

## 2018-09-24 DIAGNOSIS — I236 Thrombosis of atrium, auricular appendage, and ventricle as current complications following acute myocardial infarction: Secondary | ICD-10-CM | POA: Diagnosis not present

## 2018-09-24 LAB — BASIC METABOLIC PANEL
Anion gap: 11 (ref 5–15)
BUN: 11 mg/dL (ref 8–23)
CHLORIDE: 108 mmol/L (ref 98–111)
CO2: 20 mmol/L — ABNORMAL LOW (ref 22–32)
Calcium: 8.7 mg/dL — ABNORMAL LOW (ref 8.9–10.3)
Creatinine, Ser: 0.77 mg/dL (ref 0.44–1.00)
GFR calc non Af Amer: >60 mL/min (ref >60)
Glucose, Bld: 221 mg/dL — ABNORMAL HIGH (ref 70–99)
POTASSIUM: 3.7 mmol/L (ref 3.5–5.1)
SODIUM: 139 mmol/L (ref 135–145)

## 2018-09-24 LAB — PROTIME-INR
INR: 2.43
Prothrombin Time: 26.1 seconds — ABNORMAL HIGH (ref 11.4–15.2)

## 2018-09-24 LAB — GLUCOSE, CAPILLARY
GLUCOSE-CAPILLARY: 190 mg/dL — AB (ref 70–99)
GLUCOSE-CAPILLARY: 161 mg/dL — AB (ref 70–99)
GLUCOSE-CAPILLARY: 149 mg/dL — AB (ref 70–99)
Glucose-Capillary: 143 mg/dL — ABNORMAL HIGH (ref 70–99)

## 2018-09-24 MED ORDER — PANTOPRAZOLE SODIUM 40 MG IV SOLR
40.0000 mg | Freq: Two times a day (BID) | INTRAVENOUS | Status: DC
  Administered 2018-09-24 – 2018-09-25 (×3): 40 mg via INTRAVENOUS
  Filled 2018-09-24 (×4): qty 40

## 2018-09-24 MED ORDER — WARFARIN SODIUM 5 MG PO TABS
5.0000 mg | ORAL_TABLET | Freq: Once | ORAL | Status: AC
  Administered 2018-09-24: 5 mg via ORAL
  Filled 2018-09-24: qty 1

## 2018-09-24 MED ORDER — PNEUMOCOCCAL VAC POLYVALENT 25 MCG/0.5ML IJ INJ
0.5000 mL | INJECTION | Freq: Once | INTRAMUSCULAR | Status: AC
  Administered 2018-09-24: 0.5 mL via INTRAMUSCULAR
  Filled 2018-09-24: qty 0.5

## 2018-09-24 MED ORDER — TECHNETIUM TC 99M SULFUR COLLOID
2.0900 | Freq: Once | INTRAVENOUS | Status: AC
  Administered 2018-09-24: 2.09 via ORAL

## 2018-09-24 NOTE — Plan of Care (Addendum)
Pt  on CLD. Pt had an episode of emesis @0300 , per pt's husband. Pt did not call nurse.Pt was not given Phenergan because she was asleep when I checked on her.

## 2018-09-24 NOTE — Progress Notes (Signed)
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  Patient has persistent symptoms of nausea vomiting abdominal pain in the lower quadrant.  GI was consulted for endoscopy.    Subjective   Patient seen and examined, denies abdominal pain.  EGD was unrevealing yesterday.  Plan for gastric emptying study today.   Assessment/Plan:     1. Recurrent nausea vomiting abdominal pain-unclear etiology, patient underwent EGD today, no significant abnormality noted on the EGD.  GI recommended gastric emptying study.  Plan for gastric emptying study today.  2. Hypokalemia-replete  3. Hypertension-blood pressure stable, continue carvedilol, lisinopril.  4. Chronic systolic heart failure, LV thrombus-no signs of fluid overload.  Ejection fraction is 20 to 25%, diffuse hypokinesis systolic function severely reduced.  Continue warfarin, Lasix on hold.  5. Diabetes mellitus-continue sliding scale insulin with NovoLog  6. Dyslipidemia-continue atorvastatin.  7. History of CVA-continue atorvastatin, aspirin     CBG: Recent Labs  Lab 09/23/18 1034 09/23/18 1712 09/23/18 2057 09/24/18 0718 09/24/18 1135  GLUCAP 174* 205* 179* 190* 161*    CBC: Recent Labs  Lab 09/21/18 1836 09/22/18 0136 09/23/18 0427  WBC 7.7 8.9 7.2  NEUTROABS  --   --  5.3  HGB 14.2 12.2 11.5*  HCT 45.2 39.0 36.2  MCV 68.3* 69.1* 67.5*  PLT 279 249 237    Basic Metabolic Panel: Recent Labs  Lab 09/21/18 1836 09/22/18 0136 09/23/18 0427 09/24/18 0429  NA 138 137 138 139  K 3.8 4.2 3.2* 3.7  CL 101 105 106 108  CO2 26 23 23  20*  GLUCOSE 197* 180* 202* 221*  BUN 13 13 10 11   CREATININE 0.85 0.78 0.80 0.77   CALCIUM 9.6 8.8* 8.7* 8.7*     DVT prophylaxis: Warfarin  Code Status: Full code  Family Communication: No family at bedside  Disposition Plan: likely home when medically ready for discharge   Consultants:  GI  Procedures:  EGD   Antibiotics:   Anti-infectives (From admission, onward)   None       Objective   Vitals:   09/23/18 2059 09/23/18 2115 09/24/18 0455 09/24/18 0802  BP: 110/73  (!) 152/99 (!) 144/95  Pulse: (!) 113 96 (!) 121   Resp: 18  16 16   Temp: 98.6 F (37 C)  98.1 F (36.7 C) 98.8 F (37.1 C)  TempSrc: Oral  Oral Oral  SpO2: 100%  94% 95%  Weight:      Height:        Intake/Output Summary (Last 24 hours) at 09/24/2018 1219 Last data filed at 09/24/2018 0900 Gross per 24 hour  Intake 650 ml  Output -  Net 650 ml   Filed Weights   09/21/18 1712 09/22/18 0102 09/23/18 1027  Weight: 51.7 kg 50.3 kg 50 kg     Physical Examination:    Neck: Supple, no deformities, masses, or tenderness Lungs: Normal respiratory effort, bilateral clear to auscultation, no crackles or wheezes.  Heart: Regular rate and rhythm, S1 and S2 normal, no murmurs, rubs auscultated Abdomen: BS normoactive,soft,nondistended,non-tender to palpation,no organomegaly Extremities: No pretibial edema, no erythema, no cyanosis, no clubbing Neuro : Alert and oriented to time, place and person, No focal deficits      Data  Reviewed: I have personally reviewed following labs and imaging studies   No results found for this or any previous visit (from the past 240 hour(s)).   Liver Function Tests: Recent Labs  Lab 09/21/18 1836 09/22/18 0136 09/22/18 1554  AST 102* 70* 57*  ALT 85* 67* 66*  ALKPHOS 65 51 51  BILITOT 1.5* 1.6* 1.5*  PROT 7.7 6.5 6.7  ALBUMIN 4.0 3.3* 3.5   Recent Labs  Lab 09/21/18 1836  LIPASE 30   No results for input(s): AMMONIA in the last 168 hours.  Cardiac Enzymes: Recent Labs  Lab 09/22/18 1554  CKTOTAL 112   BNP (last  3 results) Recent Labs    09/01/18 1600 09/08/18 0240 09/22/18 0136  BNP 1,235.6* 493.0* 1,495.9*    ProBNP (last 3 results) No results for input(s): PROBNP in the last 8760 hours.    Studies: No results found.  Scheduled Meds: . aspirin EC  81 mg Oral Daily  . atorvastatin  20 mg Oral q1800  . B-complex with vitamin C  1 tablet Oral Daily  . carvedilol  12.5 mg Oral BID WC  . chlorhexidine  15 mL Mouth Rinse BID  . feeding supplement (ENSURE ENLIVE)  237 mL Oral TID BM  . insulin aspart  0-5 Units Subcutaneous QHS  . insulin aspart  0-9 Units Subcutaneous TID WC  . lisinopril  2.5 mg Oral Daily  . mouth rinse  15 mL Mouth Rinse q12n4p  . multivitamin with minerals  1 tablet Oral Daily  . pantoprazole (PROTONIX) IV  40 mg Intravenous Q12H  . warfarin  5 mg Oral ONCE-1800  . Warfarin - Pharmacist Dosing Inpatient   Does not apply q1800    Admission status: Observation  Time spent: 20 min  [facility] Pager [phone]. If 7PM-7AM, please contact night-coverage at [email or web address], Office  [phone]  password [email or web address]  09/24/2018, 12:19 PM  LOS: 0 days

## 2018-09-24 NOTE — Progress Notes (Signed)
ANTICOAGULATION CONSULT NOTE - Follow Up Consult  Pharmacy Consult for warfarin Indication: hx LV mural thrombus and CVA  No Known Allergies  Patient Measurements: Height: 5\' 3"  (160 cm) Weight: 110 lb 3.7 oz (50 kg) IBW/kg (Calculated) : 52.4 Heparin Dosing Weight:   Vital Signs: Temp: 98.1 F (36.7 C) (10/31 0455) Temp Source: Oral (10/31 0455) BP: 152/99 (10/31 0455) Pulse Rate: 121 (10/31 0455)  Labs: Recent Labs    09/21/18 1836 09/22/18 0136 09/22/18 1554 09/23/18 0427 09/24/18 0429  HGB 14.2 12.2  --  11.5*  --   HCT 45.2 39.0  --  36.2  --   PLT 279 249  --  222  --   LABPROT  --  20.5*  --  24.4* 26.1*  INR  --  1.78  --  2.23 2.43  CREATININE 0.85 0.78  --  0.80 0.77  CKTOTAL  --   --  112  --   --     Estimated Creatinine Clearance: 56.8 mL/min (by C-G formula based on SCr of 0.77 mg/dL).   Medications:  PTA warfarin regimen: 5 mg daily except 7.5 mg on Monday and Friday  Assessment: Patient is a 62 y.o F with hx CVA and LV mural thrombus on warfarin PTA presented to the ED on 09/21/18 with c/o abd pain and n/v.  Warfarin resumed on admission.  - 10/30 EGF: no significant findings  Today, 09/24/2018: - INR is therapeutic today at 2.43 - no new cbc - no bleeding documented - no significant drug-drug intxns - plan for gastric emptying study  Goal of Therapy:  INR 2-3 Monitor platelets by anticoagulation protocol: Yes   Plan:  - repeat warfarin 5 mg PO x1 today - monitor for s/s bleeding  Anneta Rounds P 09/24/2018,7:30 AM

## 2018-09-24 NOTE — Progress Notes (Signed)
     [facility] Progress Note  We attempted to see the patient a couple of times today but she was been at her GES.  Will check back on her again tomorrow.    *Nausea, vomiting, abdominal pain:  Extensive evaluation thus far has been unrevealing.  While I was in the room her "vomiting" was actually "spitting" of clear saliva material.  She does not speak much or give history.  There has been mention of gastroparesis.  EGD was normal.  Gastric biopsies showed only chronic gastritis, no Hpylori.  For GES today, will await results.  ? If her symptoms are due to Trulicity, which was apparently started shortly before symptoms began.  Would see about holding that for now, especially if GES normal or fairly unremarkable. *Elevated LFT's:  She did have abnormal bile duct imaging in the past to which she was referred to [facility] and never went there, but LFT's had been normal in the past.  Elevation currently is very mild. Serologic liver eval has been negative thus far and LFT's are stable.  ? Due to hepatic congestion from heart failure. 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 Kristen Lozano  09/24/2018, 12:08 PM

## 2018-09-24 NOTE — Progress Notes (Signed)
Notified sister, Inez Catalina that patient will be going down for gastric emptying study within the hour. Will continue to monitor pt. Geni Bers RN)

## 2018-09-25 DIAGNOSIS — R1011 Right upper quadrant pain: Secondary | ICD-10-CM | POA: Diagnosis not present

## 2018-09-25 DIAGNOSIS — I5042 Chronic combined systolic (congestive) and diastolic (congestive) heart failure: Secondary | ICD-10-CM | POA: Diagnosis not present

## 2018-09-25 DIAGNOSIS — R112 Nausea with vomiting, unspecified: Secondary | ICD-10-CM | POA: Diagnosis not present

## 2018-09-25 DIAGNOSIS — R1010 Upper abdominal pain, unspecified: Secondary | ICD-10-CM | POA: Diagnosis not present

## 2018-09-25 DIAGNOSIS — K219 Gastro-esophageal reflux disease without esophagitis: Secondary | ICD-10-CM

## 2018-09-25 DIAGNOSIS — R109 Unspecified abdominal pain: Secondary | ICD-10-CM | POA: Diagnosis not present

## 2018-09-25 LAB — PROTIME-INR
INR: 2.09
Prothrombin Time: 23.2 seconds — ABNORMAL HIGH (ref 11.4–15.2)

## 2018-09-25 LAB — GLUCOSE, CAPILLARY
Glucose-Capillary: 172 mg/dL — ABNORMAL HIGH (ref 70–99)
Glucose-Capillary: 214 mg/dL — ABNORMAL HIGH (ref 70–99)

## 2018-09-25 MED ORDER — ONDANSETRON HCL 4 MG PO TABS
4.0000 mg | ORAL_TABLET | Freq: Two times a day (BID) | ORAL | Status: DC
  Administered 2018-09-25: 4 mg via ORAL
  Filled 2018-09-25: qty 1

## 2018-09-25 MED ORDER — WARFARIN SODIUM 5 MG PO TABS: 7.5000 mg | ORAL_TABLET | Freq: Once | ORAL | Status: DC

## 2018-09-25 MED ORDER — INSULIN GLARGINE 100 UNIT/ML ~~LOC~~ SOLN
10.0000 [IU] | Freq: Every day | SUBCUTANEOUS | Status: DC
  Filled 2018-09-25: qty 0.1

## 2018-09-25 MED ORDER — ONDANSETRON HCL 4 MG PO TABS: 4.0000 mg | ORAL_TABLET | Freq: Two times a day (BID) | ORAL | 0 refills | Status: DC

## 2018-09-25 MED ORDER — PANTOPRAZOLE SODIUM 40 MG PO TBEC: 40.0000 mg | DELAYED_RELEASE_TABLET | Freq: Every day | ORAL | 1 refills | Status: DC

## 2018-09-25 MED ORDER — INSULIN GLARGINE 100 UNIT/ML ~~LOC~~ SOLN: 10.0000 [IU] | Freq: Every day | SUBCUTANEOUS | 11 refills | Status: DC

## 2018-09-25 NOTE — Progress Notes (Signed)
ANTICOAGULATION CONSULT NOTE - Follow Up Consult  Pharmacy Consult for warfarin Indication: hx LV mural thrombus and CVA  No Known Allergies  Patient Measurements: Height: 5\' 3"  (160 cm) Weight: 110 lb 3.7 oz (50 kg) IBW/kg (Calculated) : 52.4 Heparin Dosing Weight:   Vital Signs: Temp: 98.3 F (36.8 C) (11/01 0514) Temp Source: Oral (11/01 0514) BP: 150/104 (11/01 0557) Pulse Rate: 115 (11/01 0557)  Labs: Recent Labs    09/22/18 1554 09/23/18 0427 09/24/18 0429 09/25/18 0423  HGB  --  11.5*  --   --   HCT  --  36.2  --   --   PLT  --  222  --   --   LABPROT  --  24.4* 26.1* 23.2*  INR  --  2.23 2.43 2.09  CREATININE  --  0.80 0.77  --   CKTOTAL 112  --   --   --     Estimated Creatinine Clearance: 56.8 mL/min (by C-G formula based on SCr of 0.77 mg/dL).   Medications:  PTA warfarin regimen: 5 mg daily except 7.5 mg on Monday and Friday  Assessment: Patient is a 62 y.o F with hx CVA and LV mural thrombus on warfarin PTA presented to the ED on 09/21/18 with c/o abd pain and n/v.  Warfarin resumed on admission.  - 10/30 EGD: no significant findings  Today, 09/25/2018: - INR is therapeutic today but down from 2.43 to 2.09 - no new cbc - no bleeding documented - no significant drug-drug intxns  Goal of Therapy:  INR 2-3 Monitor platelets by anticoagulation protocol: Yes   Plan:  - warfarin 7.5 mg PO x1 today (home dose) - monitor for s/s bleeding  Felina Tello P 09/25/2018,7:08 AM

## 2018-09-25 NOTE — Progress Notes (Addendum)
[facility] Progress Note  CC:  Abdominal pain and nausea/vomiting  Subjective:  Patient was alone in her room today.  Tells me she is feeling better and wants to go home.  Also wants to try to eat.  Objective:  Vital signs in last 24 hours: Temp:  [98.3 F (36.8 C)-99.9 F (37.7 C)] 98.3 F (36.8 C) (11/01 0514) Pulse Rate:  [115-121] 115 (11/01 0557) Resp:  [16-24] 20 (11/01 0557) BP: (132-160)/(91-106) 150/104 (11/01 0557) SpO2:  [94 %-98 %] 98 % (11/01 0557) Last BM Date: 09/24/18 General:  Alert, Well-developed, in NAD Heart:  Tach. Pulm:  CTAB.  No increased WOB. Abdomen:  Soft, non-distended.  BS present.  Non-tender. Extremities:  Without edema. Neurologic:  Alert;  grossly normal neurologically.  Intake/Output from previous day: 10/31 0701 - 11/01 0700 In: 398.9 [I.V.:398.9] Out: -   Lab Results: Recent Labs    09/23/18 0427  WBC 7.2  HGB 11.5*  HCT 36.2  PLT 222   BMET Recent Labs    09/23/18 0427 09/24/18 0429  NA 138 139  K 3.2* 3.7  CL 106 108  CO2 23 20*  GLUCOSE 202* 221*  BUN 10 11  CREATININE 0.80 0.77  CALCIUM 8.7* 8.7*   LFT Recent Labs    09/22/18 1554  PROT 6.7  ALBUMIN 3.5  AST 57*  ALT 66*  ALKPHOS 51  BILITOT 1.5*  BILIDIR 0.4*  IBILI 1.1*   PT/INR Recent Labs    09/24/18 0429 09/25/18 0423  LABPROT 26.1* 23.2*  INR 2.43 2.09   Hepatitis Panel Recent Labs    09/22/18 1554  HEPBSAG Negative  HCVAB <0.1    Nm Gastric Emptying  Result Date: 09/24/2018 CLINICAL DATA:  Stomach pain.  Frequent nausea and vomiting. EXAM: NUCLEAR MEDICINE GASTRIC EMPTYING SCAN TECHNIQUE: After oral ingestion of radiolabeled meal, sequential abdominal images were obtained for 4 hours. Percentage of activity emptying the stomach was calculated at 1 hour, 2 hour, 3 hour, and 4 hours. RADIOPHARMACEUTICALS:  2.09 mCi Tc-20m sulfur colloid in standardized meal; the patient ingested 2-3 bites of the prepared egg, and  declined to ingest any further. COMPARISON:  CT of the abdomen and pelvis on 09/01/2018 FINDINGS: Expected location of the stomach in the left upper quadrant. Ingested meal empties the stomach gradually over the course of the study. 36.24% emptied at 1 hr ( normal >= 10%) 75.57% emptied at 2 hr ( normal >= 40%) 86.3% emptied at 3 hr ( normal >= 70%) 91.03% emptied at 4 hr ( normal >= 90%) IMPRESSION: Normal gastric emptying study. Electronically Signed   By: Nolon Nations M.D.   On: 09/24/2018 17:18   Assessment / Plan: *Nausea, vomiting, abdominal pain:  Extensive evaluation thus far has been unrevealing. While I was in the room her "vomiting" was actually "spitting" of clear saliva material. She seems much better today.  EGD was normal.  Gastric biopsies showed only chronic gastritis, no Hpylori.  GES normal as well.  ? If her symptoms are due to Trulicity, which was apparently started shortly before symptoms began.  Would see about holding that for now.  Would give Zofran 4 mg BID scheduled, which she can go home with.  Would continue once daily PPI at discharge.  Advance diet as tolerated. *Elevated LFT's:  She did have abnormal bile duct imaging in the past to which she was referred to [facility] and never went there, but LFT's had been normal in the past. Elevation  currently is very mild. Serologic liver eval has been negative thus far and LFT's are stable.  ? Due to hepatic congestion from heart failure. 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   LOS: 0 days   Laban Emperor. Elana Jian  09/25/2018, 9:01 AM

## 2018-09-25 NOTE — Discharge Summary (Addendum)
Physician Discharge Summary  Kristen Lozano HGD:[id number] DOB: February 13, 1956 DOA: 09/21/2018  PCP: Audley Hose, MD  Admit date: 09/21/2018 Discharge date: 09/25/2018  Time spent: 35 minutes  Recommendations for Outpatient Follow-up:  1. 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)   Chronic combined systolic and diastolic CHF (congestive heart failure) (HCC)   Type 2 diabetes mellitus with vascular disease (HCC)   Sinus tachycardia   HLD (hyperlipidemia)   GERD (gastroesophageal reflux disease)   Discharge Condition: Stable  Diet recommendation: Carb modified diet  Filed Weights   09/21/18 1712 09/22/18 0102 09/23/18 1027  Weight: 51.7 kg 50.3 kg 50 kg    History of present illness:  62 year old female came with abdominal pain, past medical history of hypertension, dyslipidemia, type 2 diabetes mellitus, GERD, diastolic heart failure also has sickle cell trait with left ventricle thrombus.  Patient has persistent symptoms of nausea vomiting abdominal pain in the lower quadrant.  GI was consulted for endoscopy.   Hospital Course:   1. Recurrent nausea vomiting abdominal pain-unclear etiology, ? Hepatic congestion from CHF, ? Trulicity, patient underwent EGD today, no significant abnormality noted on the EGD.  GI recommended gastric emptying study. Gastric emptying study was normal, GI recommends scheduled Zofran twice daily, daily PPI. Will follow up GI in 4 weeks  2. Hypokalemia-replete  3. Hypertension-blood pressure stable, continue carvedilol, lisinopril.  4. Chronic systolic heart failure, LV thrombus-no signs of fluid overload.  Ejection fraction is 20 to 25%, diffuse hypokinesis systolic function severely reduced.  Continue warfarin,  Lasix  5. Diabetes mellitus-continue Metformin, will d/c Trulicity, as it can cause nausea,vomiting. Start Lantus 10 units sub cut daily  6. Dyslipidemia-continue atorvastatin.  7. History of CVA-continue atorvastatin, aspirin   Procedures:  EGD  Consultations:  GI  Discharge Exam: Vitals:   09/25/18 1047 09/25/18 1147  BP: (!) 168/116 130/70  Pulse: (!) 118   Resp: 20   Temp:    SpO2:      General: Appears in no acute distress Cardiovascular: S1S2 RRR Respiratory: Clear bilaterally  Discharge Instructions   Discharge Instructions    Diet - low sodium heart healthy   Complete by:  As directed    Increase activity slowly   Complete by:  As directed      Allergies as of 09/25/2018   No Known Allergies     Medication List    STOP taking these medications   Metoprolol Tartrate 75 MG Tabs   spironolactone 25 MG tablet Commonly known as:  ALDACTONE   TRULICITY 9.62 IW/9.7LG Sopn Generic drug:  Dulaglutide     TAKE these medications   ASPIRIN LOW DOSE 81 MG EC tablet Generic drug:  aspirin Take 1 tablet (81 mg total) by mouth daily. What changed:  See the new instructions.   atorvastatin 20 MG tablet Commonly known as:  LIPITOR Take 1 tablet (20 mg total) by mouth daily at 6 PM. Please schedule appointment for refills.   b complex vitamins tablet Take 1 tablet by mouth daily.   carvedilol 12.5 MG tablet Commonly known as:  COREG Take 12.5 mg by mouth 2 (two) times daily with a meal.   docusate sodium 100 MG capsule Commonly known as:  COLACE Take 1 capsule (100 mg total) by  mouth 2 (two) times daily. What changed:    when to take this  reasons to take this   feeding supplement (ENSURE ENLIVE) Liqd Take 237 mLs by mouth 3 (three) times daily between meals.   furosemide 20 MG tablet Commonly known as:  LASIX Take 1 tablet (20 mg total) by mouth daily.   insulin glargine 100 UNIT/ML injection Commonly known as:  LANTUS Inject 0.1  mLs (10 Units total) into the skin at bedtime.   lisinopril 2.5 MG tablet Commonly known as:  PRINIVIL,ZESTRIL Take 2.5 mg by mouth daily.   metFORMIN 500 MG 24 hr tablet Commonly known as:  GLUCOPHAGE-XR Take 1,000 mg by mouth 2 (two) times daily.   MULTIVITAMIN ADULT Tabs Take 1 tablet by mouth daily.   ondansetron 4 MG tablet Commonly known as:  ZOFRAN Take 1 tablet (4 mg total) by mouth every 12 (twelve) hours. What changed:    when to take this  reasons to take this   pantoprazole 40 MG tablet Commonly known as:  PROTONIX Take 1 tablet (40 mg total) by mouth daily.   polyethylene glycol packet Commonly known as:  MIRALAX / GLYCOLAX Take 17 g by mouth daily as needed. What changed:  reasons to take this   potassium chloride 10 MEQ tablet Commonly known as:  K-DUR Take 1 tablet (10 mEq total) by mouth daily.   warfarin 5 MG tablet Commonly known as:  COUMADIN Take as directed. If you are unsure how to take this medication, talk to your nurse or doctor. Original instructions:  Tale 1.5 Tablets (7.63m)daily OR as directed by coumadin clinic What changed:  See the new instructions.      No Known Allergies Follow-up Information    SLadene Artist MD Follow up on 11/11/2018.   Specialty:  Gastroenterology Why:  9:00 am Contact information: [phone]           The results of significant diagnostics from this hospitalization (including imaging, microbiology, ancillary and laboratory) are listed below for reference.    Significant Diagnostic Studies: Dg Chest 2 View  Result Date: 09/01/2018 CLINICAL DATA:  Generalized low abdominal pain since Sunday EXAM: CHEST - 2 VIEW COMPARISON:  08/10/2018 FINDINGS: Haziness of the bilateral chest. Chronic cardiomegaly. Coarsened lung markings without air bronchogram. No pneumothorax. Negative aortic and hilar contours. IMPRESSION: Small left pleural effusion with atelectasis  in the lower lungs. Electronically Signed   By: JMonte FantasiaM.D.   On: 09/01/2018 15:34   Nm Hepatobiliary Liver Func  Result Date: 09/22/2018 CLINICAL DATA:  Upper abdominal pain EXAM: NUCLEAR MEDICINE HEPATOBILIARY IMAGING VIEWS: Anterior right upper quadrant. Patient was unable to remain stationary to allow for ejection fraction assessment. RADIOPHARMACEUTICALS:  5.35 mCi Tc-961mCholetec IV COMPARISON:  None. FINDINGS: Liver uptake of radiotracer is unremarkable. There is visualization of gallbladder and small bowel, indicating patency of the cystic and common bile ducts. Ejection fraction value not determine due to patient's inability to remains stationary and proceed with this study. IMPRESSION: Patency of the cystic and common bile ducts, evidenced by visualization of gallbladder and small bowel. Electronically Signed   By: WiLowella GripII M.D.   On: 09/22/2018 09:02   Nm Gastric Emptying  Result Date: 09/24/2018 CLINICAL DATA:  Stomach pain.  Frequent nausea and vomiting. EXAM: NUCLEAR MEDICINE GASTRIC EMPTYING SCAN TECHNIQUE: After oral ingestion of radiolabeled meal, sequential abdominal images were obtained for 4 hours. Percentage of activity emptying the  stomach was calculated at 1 hour, 2 hour, 3 hour, and 4 hours. RADIOPHARMACEUTICALS:  2.09 mCi Tc-33msulfur colloid in standardized meal; the patient ingested 2-3 bites of the prepared egg, and declined to ingest any further. COMPARISON:  CT of the abdomen and pelvis on 09/01/2018 FINDINGS: Expected location of the stomach in the left upper quadrant. Ingested meal empties the stomach gradually over the course of the study. 36.24% emptied at 1 hr ( normal >= 10%) 75.57% emptied at 2 hr ( normal >= 40%) 86.3% emptied at 3 hr ( normal >= 70%) 91.03% emptied at 4 hr ( normal >= 90%) IMPRESSION: Normal gastric emptying study. Electronically Signed   By: ENolon NationsM.D.   On: 09/24/2018 17:18   Ct Abdomen Pelvis W  Contrast  Result Date: 09/01/2018 CLINICAL DATA:  Acute generalized abdominal pain. EXAM: CT ABDOMEN AND PELVIS WITH CONTRAST TECHNIQUE: Multidetector CT imaging of the abdomen and pelvis was performed using the standard protocol following bolus administration of intravenous contrast. CONTRAST:  1042mISOVUE-300 IOPAMIDOL (ISOVUE-300) INJECTION 61% COMPARISON:  08/09/2018 CT FINDINGS: Lower chest: Stable cardiomegaly with moderate volume bilateral pleural effusions and compressive atelectasis as before. Hepatobiliary: Hepatic steatosis. No space-occupying mass. Physiologic distention of the gallbladder without stones. No biliary dilatation. Pancreas: Unremarkable. No pancreatic ductal dilatation or surrounding inflammatory changes. Spleen: Normal in size without focal abnormality. Adrenals/Urinary Tract: Normal bilateral adrenal glands. Scattered areas of bilateral renal cortical scarring are redemonstrated with subcentimeter hypodensities too small to further characterize but statistically consistent with cysts. No hydroureteronephrosis. The urinary bladder is unremarkable for the degree of distention. Stomach/Bowel: Stomach is within normal limits. Appendix appears normal. No evidence of bowel wall thickening, distention, or inflammatory changes. Vascular/Lymphatic: No significant vascular findings are present. No enlarged abdominal or pelvic lymph nodes. Reproductive: Uterus and bilateral adnexa are unremarkable. Other: Trace physiologic free fluid in pelvis. Musculoskeletal: No acute or significant osseous findings. IMPRESSION: 1. No acute bowel obstruction or inflammatory change noted. 2. Stable cardiomegaly with moderate bilateral pleural effusions with compressive atelectasis. 3. Bilateral renal cortical scarring with too small to characterize subcentimeter hypodensities statistically consistent with cysts. Electronically Signed   By: DaAshley Royalty.D.   On: 09/01/2018 17:08   UsKoreabdomen Limited  Result  Date: 09/21/2018 CLINICAL DATA:  Abdominal pain with vomiting and elevated LFTs as well as elevated bilirubin. EXAM: ULTRASOUND ABDOMEN LIMITED RIGHT UPPER QUADRANT COMPARISON:  CT 09/01/2018 FINDINGS: Gallbladder: No evidence of gallstones or sludge. Mild wall thickening measuring 5.7 mm. Possible tiny amount of pericholecystic fluid. Common bile duct: Diameter: 3.1 mm. Liver: No focal lesion identified. Within normal limits in parenchymal echogenicity. Portal vein is patent on color Doppler imaging with normal direction of blood flow towards the liver. Right pleural effusion. IMPRESSION: Nonspecific gallbladder wall thickening. No evidence of gallstones or sludge. HIDA scan may be helpful to assess gallbladder function. Normal liver and biliary tree. Right pleural effusion. Electronically Signed   By: DaMarin Olp.D.   On: 09/21/2018 22:20    Microbiology: No results found for this or any previous visit (from the past 240 hour(s)).   Labs: Basic Metabolic Panel: Recent Labs  Lab 09/21/18 1836 09/22/18 0136 09/23/18 0427 09/24/18 0429  NA 138 137 138 139  K 3.8 4.2 3.2* 3.7  CL 101 105 106 108  CO2 26 23 23  20*  GLUCOSE 197* 180* 202* 221*  BUN 13 13 10 11   CREATININE 0.85 0.78 0.80 0.77  CALCIUM 9.6 8.8* 8.7* 8.7*  Liver Function Tests: Recent Labs  Lab 09/21/18 1836 09/22/18 0136 09/22/18 1554  AST 102* 70* 57*  ALT 85* 67* 66*  ALKPHOS 65 51 51  BILITOT 1.5* 1.6* 1.5*  PROT 7.7 6.5 6.7  ALBUMIN 4.0 3.3* 3.5   Recent Labs  Lab 09/21/18 1836  LIPASE 30   No results for input(s): AMMONIA in the last 168 hours. CBC: Recent Labs  Lab 09/21/18 1836 09/22/18 0136 09/23/18 0427  WBC 7.7 8.9 7.2  NEUTROABS  --   --  5.3  HGB 14.2 12.2 11.5*  HCT 45.2 39.0 36.2  MCV 68.3* 69.1* 67.5*  PLT 279 249 222   Cardiac Enzymes: Recent Labs  Lab 09/22/18 1554  CKTOTAL 112   BNP: BNP (last 3 results) Recent Labs    09/01/18 1600 09/08/18 0240 09/22/18 0136  BNP  1,235.6* 493.0* 1,495.9*    ProBNP (last 3 results) No results for input(s): PROBNP in the last 8760 hours.  CBG: Recent Labs  Lab 09/24/18 1135 09/24/18 1749 09/24/18 2110 09/25/18 0734 09/25/18 1133  GLUCAP 161* 143* 149* 172* 214*       Signed:  Oswald Hillock MD.  [facility] 09/25/2018, 12:11 PM

## 2018-09-28 ENCOUNTER — Ambulatory Visit: Payer: Medicare HMO | Admitting: Adult Health

## 2018-09-29 ENCOUNTER — Encounter: Payer: Self-pay | Admitting: *Deleted

## 2018-09-30 ENCOUNTER — Ambulatory Visit: Payer: Medicare HMO | Admitting: Internal Medicine

## 2018-09-30 ENCOUNTER — Ambulatory Visit (INDEPENDENT_AMBULATORY_CARE_PROVIDER_SITE_OTHER): Payer: Medicare HMO | Admitting: *Deleted

## 2018-09-30 DIAGNOSIS — Z5181 Encounter for therapeutic drug level monitoring: Secondary | ICD-10-CM | POA: Diagnosis not present

## 2018-09-30 DIAGNOSIS — I236 Thrombosis of atrium, auricular appendage, and ventricle as current complications following acute myocardial infarction: Secondary | ICD-10-CM

## 2018-09-30 DIAGNOSIS — Z7901 Long term (current) use of anticoagulants: Secondary | ICD-10-CM | POA: Diagnosis not present

## 2018-09-30 LAB — POCT INR: INR: 1.9 — AB (ref 2.0–3.0)

## 2018-09-30 NOTE — Patient Instructions (Signed)
Description   Today take 1.5 pills, then Continue  taking 1 pill everyday except 1.5 pills on Mondays and Fridays.  Recheck in 2 weeks.  Eat Green vegetables 2 times a week.  [facility] [phone] Main [phone]

## 2018-10-01 ENCOUNTER — Ambulatory Visit: Payer: Medicare HMO | Admitting: Internal Medicine

## 2018-10-06 ENCOUNTER — Other Ambulatory Visit: Payer: Self-pay | Admitting: Internal Medicine

## 2018-10-06 NOTE — Anesthesia Postprocedure Evaluation (Signed)
Anesthesia Post Note  Patient: Kristen Lozano  Procedure(s) Performed: ESOPHAGOGASTRODUODENOSCOPY (EGD) WITH PROPOFOL (N/A ) BIOPSY     Patient location during evaluation: Endoscopy Anesthesia Type: MAC Level of consciousness: awake and alert Pain management: pain level controlled Vital Signs Assessment: post-procedure vital signs reviewed and stable Respiratory status: spontaneous breathing, nonlabored ventilation, respiratory function stable and patient connected to nasal cannula oxygen Cardiovascular status: stable and blood pressure returned to baseline Postop Assessment: no apparent nausea or vomiting Anesthetic complications: no    Last Vitals:  Vitals:   09/25/18 1047 09/25/18 1147  BP: (!) 168/116 130/70  Pulse: (!) 118   Resp: 20   Temp:    SpO2:      Last Pain:  Vitals:   09/25/18 0815  TempSrc:   PainSc: 0-No pain                 [name]

## 2018-10-07 ENCOUNTER — Encounter: Payer: Self-pay | Admitting: Internal Medicine

## 2018-10-07 ENCOUNTER — Ambulatory Visit (INDEPENDENT_AMBULATORY_CARE_PROVIDER_SITE_OTHER): Payer: Medicare HMO | Admitting: Internal Medicine

## 2018-10-07 VITALS — BP 122/80 | HR 115 | Ht 65.0 in | Wt 115.2 lb

## 2018-10-07 DIAGNOSIS — I236 Thrombosis of atrium, auricular appendage, and ventricle as current complications following acute myocardial infarction: Secondary | ICD-10-CM | POA: Diagnosis not present

## 2018-10-07 DIAGNOSIS — I639 Cerebral infarction, unspecified: Secondary | ICD-10-CM

## 2018-10-07 DIAGNOSIS — I5023 Acute on chronic systolic (congestive) heart failure: Secondary | ICD-10-CM | POA: Diagnosis not present

## 2018-10-07 NOTE — Patient Instructions (Signed)
Medication Instructions:  Continue current medications If you need a refill on your cardiac medications before your next appointment, please call your pharmacy.   Testing/Procedures: Dr. Debara Pickett has recommended a cardiac catheterization. Please call our office if you decide to schedule this. Please provide [place] with days that work best for you. The procedures are done at [facility] and start around 7-7:30am with a 5-5:30am arrival time. You will need lab work prior to this. You will need to hold warfarin for 5 days prior.  Follow-Up: At [facility], you and your health needs are our priority.  As part of our continuing mission to provide you with exceptional heart care, we have created designated Provider Care Teams.  These Care Teams include your primary Cardiologist (physician) and Advanced Practice Providers (APPs -  Physician Assistants and Nurse Practitioners) who all work together to provide you with the care you need, when you need it. . You will need a follow up appointment in 3 months with Dr. Debara Pickett  Any Other Special Instructions Will Be Listed Below (If Applicable).
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
## 2018-10-09 ENCOUNTER — Other Ambulatory Visit: Payer: Self-pay | Admitting: *Deleted

## 2018-10-09 ENCOUNTER — Telehealth: Payer: Self-pay | Admitting: Internal Medicine

## 2018-10-09 DIAGNOSIS — Z01812 Encounter for preprocedural laboratory examination: Secondary | ICD-10-CM

## 2018-10-09 DIAGNOSIS — Z7901 Long term (current) use of anticoagulants: Secondary | ICD-10-CM

## 2018-10-09 DIAGNOSIS — Z5181 Encounter for therapeutic drug level monitoring: Secondary | ICD-10-CM

## 2018-10-09 DIAGNOSIS — I5023 Acute on chronic systolic (congestive) heart failure: Secondary | ICD-10-CM

## 2018-10-09 DIAGNOSIS — I5022 Chronic systolic (congestive) heart failure: Secondary | ICD-10-CM

## 2018-10-09 NOTE — Telephone Encounter (Signed)
Returned call to patient-she states Tuesdays, Wednesdays, or Thursdays would be best for her.   Advised would route to primary RN to schedule

## 2018-10-09 NOTE — Telephone Encounter (Signed)
Spoke with patient who reports Tues, Wed, Thurs work for her, not first case  Patient scheduled with Dr. Claiborne Billings 10/14/18 @ 10am with 8am arrival time. She will HOLD warfarin 5 days prior, starting TODAY. She was advised to hold lasix day of procedure, take half dose insulin evening prior, hold metformin day of procedure and 48 hours after.   She will need BMET, CBC, INR prior to procedure on 11/18. She will pick up instruction letter at this time.

## 2018-10-09 NOTE — Telephone Encounter (Signed)
Patient is calling to schedule procedure.

## 2018-10-11 ENCOUNTER — Encounter (HOSPITAL_COMMUNITY): Payer: Self-pay

## 2018-10-11 ENCOUNTER — Inpatient Hospital Stay (HOSPITAL_COMMUNITY): Payer: Medicare HMO

## 2018-10-11 ENCOUNTER — Other Ambulatory Visit: Payer: Self-pay

## 2018-10-11 ENCOUNTER — Inpatient Hospital Stay (HOSPITAL_COMMUNITY)
Admission: EM | Admit: 2018-10-11 | Discharge: 2018-10-16 | DRG: 287 | Disposition: A | Discharge status: Home Health | Payer: Medicare HMO | Attending: Cardiovascular Disease | Admitting: Cardiovascular Disease

## 2018-10-11 DIAGNOSIS — Z823 Family history of stroke: Secondary | ICD-10-CM

## 2018-10-11 DIAGNOSIS — R000 Tachycardia, unspecified: Secondary | ICD-10-CM

## 2018-10-11 DIAGNOSIS — K920 Hematemesis: Secondary | ICD-10-CM | POA: Diagnosis present

## 2018-10-11 DIAGNOSIS — Z7982 Long term (current) use of aspirin: Secondary | ICD-10-CM

## 2018-10-11 DIAGNOSIS — E785 Hyperlipidemia, unspecified: Secondary | ICD-10-CM | POA: Diagnosis present

## 2018-10-11 DIAGNOSIS — I5043 Acute on chronic combined systolic (congestive) and diastolic (congestive) heart failure: Secondary | ICD-10-CM | POA: Diagnosis present

## 2018-10-11 DIAGNOSIS — I272 Pulmonary hypertension, unspecified: Secondary | ICD-10-CM | POA: Diagnosis present

## 2018-10-11 DIAGNOSIS — I69319 Unspecified symptoms and signs involving cognitive functions following cerebral infarction: Secondary | ICD-10-CM

## 2018-10-11 DIAGNOSIS — IMO0002 Reserved for concepts with insufficient information to code with codable children: Secondary | ICD-10-CM | POA: Diagnosis present

## 2018-10-11 DIAGNOSIS — I110 Hypertensive heart disease with heart failure: Principal | ICD-10-CM | POA: Diagnosis present

## 2018-10-11 DIAGNOSIS — I5041 Acute combined systolic (congestive) and diastolic (congestive) heart failure: Secondary | ICD-10-CM | POA: Diagnosis present

## 2018-10-11 DIAGNOSIS — E876 Hypokalemia: Secondary | ICD-10-CM | POA: Diagnosis present

## 2018-10-11 DIAGNOSIS — R112 Nausea with vomiting, unspecified: Secondary | ICD-10-CM

## 2018-10-11 DIAGNOSIS — Z79899 Other long term (current) drug therapy: Secondary | ICD-10-CM

## 2018-10-11 DIAGNOSIS — I5023 Acute on chronic systolic (congestive) heart failure: Secondary | ICD-10-CM

## 2018-10-11 DIAGNOSIS — Z7901 Long term (current) use of anticoagulants: Secondary | ICD-10-CM

## 2018-10-11 DIAGNOSIS — K219 Gastro-esophageal reflux disease without esophagitis: Secondary | ICD-10-CM | POA: Diagnosis present

## 2018-10-11 DIAGNOSIS — Z8249 Family history of ischemic heart disease and other diseases of the circulatory system: Secondary | ICD-10-CM

## 2018-10-11 DIAGNOSIS — I248 Other forms of acute ischemic heart disease: Secondary | ICD-10-CM | POA: Diagnosis present

## 2018-10-11 DIAGNOSIS — E1165 Type 2 diabetes mellitus with hyperglycemia: Secondary | ICD-10-CM | POA: Diagnosis present

## 2018-10-11 DIAGNOSIS — I252 Old myocardial infarction: Secondary | ICD-10-CM

## 2018-10-11 DIAGNOSIS — I10 Essential (primary) hypertension: Secondary | ICD-10-CM | POA: Diagnosis present

## 2018-10-11 DIAGNOSIS — I639 Cerebral infarction, unspecified: Secondary | ICD-10-CM | POA: Diagnosis present

## 2018-10-11 DIAGNOSIS — I259 Chronic ischemic heart disease, unspecified: Secondary | ICD-10-CM | POA: Diagnosis present

## 2018-10-11 DIAGNOSIS — I236 Thrombosis of atrium, auricular appendage, and ventricle as current complications following acute myocardial infarction: Secondary | ICD-10-CM | POA: Diagnosis present

## 2018-10-11 DIAGNOSIS — I5042 Chronic combined systolic (congestive) and diastolic (congestive) heart failure: Secondary | ICD-10-CM | POA: Diagnosis present

## 2018-10-11 DIAGNOSIS — I5020 Unspecified systolic (congestive) heart failure: Secondary | ICD-10-CM | POA: Diagnosis present

## 2018-10-11 DIAGNOSIS — Z794 Long term (current) use of insulin: Secondary | ICD-10-CM

## 2018-10-11 DIAGNOSIS — I2510 Atherosclerotic heart disease of native coronary artery without angina pectoris: Secondary | ICD-10-CM | POA: Diagnosis present

## 2018-10-11 DIAGNOSIS — D573 Sickle-cell trait: Secondary | ICD-10-CM | POA: Diagnosis present

## 2018-10-11 DIAGNOSIS — I428 Other cardiomyopathies: Secondary | ICD-10-CM

## 2018-10-11 DIAGNOSIS — F411 Generalized anxiety disorder: Secondary | ICD-10-CM | POA: Diagnosis present

## 2018-10-11 DIAGNOSIS — Z832 Family history of diseases of the blood and blood-forming organs and certain disorders involving the immune mechanism: Secondary | ICD-10-CM

## 2018-10-11 HISTORY — DX: Acute combined systolic (congestive) and diastolic (congestive) heart failure: I50.41

## 2018-10-11 LAB — TROPONIN I: Troponin I: 0.03 ng/mL (ref <0.03)

## 2018-10-11 LAB — CBC WITH DIFFERENTIAL/PLATELET
Abs Immature Granulocytes: 0.04 10*3/uL (ref 0.00–0.07)
BASOS PCT: 1 %
Basophils Absolute: 0.1 10*3/uL (ref 0.0–0.1)
EOS ABS: 0 10*3/uL (ref 0.0–0.5)
EOS PCT: 0 %
HCT: 42.3 % (ref 36.0–46.0)
Hemoglobin: 13.5 g/dL (ref 12.0–15.0)
IMMATURE GRANULOCYTES: 0 %
Lymphocytes Relative: 19 %
Lymphs Abs: 1.8 10*3/uL (ref 0.7–4.0)
MCH: 21.9 pg — AB (ref 26.0–34.0)
MCHC: 31.9 g/dL (ref 30.0–36.0)
MCV: 68.6 fL — AB (ref 80.0–100.0)
MONOS PCT: 9 %
Monocytes Absolute: 0.9 10*3/uL (ref 0.1–1.0)
NEUTROS PCT: 71 %
Neutro Abs: 6.7 10*3/uL (ref 1.7–7.7)
PLATELETS: 273 10*3/uL (ref 150–400)
RBC: 6.17 MIL/uL — ABNORMAL HIGH (ref 3.87–5.11)
RDW: 16.9 % — AB (ref 11.5–15.5)
WBC: 9.5 10*3/uL (ref 4.0–10.5)
nRBC: 0 % (ref 0.0–0.2)

## 2018-10-11 LAB — COMPREHENSIVE METABOLIC PANEL
ALT: 33 U/L (ref 0–44)
ANION GAP: 9 (ref 5–15)
AST: 24 U/L (ref 15–41)
Albumin: 3.3 g/dL — ABNORMAL LOW (ref 3.5–5.0)
Alkaline Phosphatase: 38 U/L (ref 38–126)
BUN: 12 mg/dL (ref 8–23)
CHLORIDE: 104 mmol/L (ref 98–111)
CO2: 24 mmol/L (ref 22–32)
Calcium: 8.5 mg/dL — ABNORMAL LOW (ref 8.9–10.3)
Creatinine, Ser: 0.69 mg/dL (ref 0.44–1.00)
Glucose, Bld: 232 mg/dL — ABNORMAL HIGH (ref 70–99)
POTASSIUM: 3.9 mmol/L (ref 3.5–5.1)
Sodium: 137 mmol/L (ref 135–145)
TOTAL PROTEIN: 6.3 g/dL — AB (ref 6.5–8.1)
Total Bilirubin: 1.5 mg/dL — ABNORMAL HIGH (ref 0.3–1.2)

## 2018-10-11 LAB — LIPASE, BLOOD: Lipase: 23 U/L (ref 11–51)

## 2018-10-11 LAB — GLUCOSE, CAPILLARY: GLUCOSE-CAPILLARY: 240 mg/dL — AB (ref 70–99)

## 2018-10-11 LAB — ETHANOL: Alcohol, Ethyl (B): <10 mg/dL (ref <10)

## 2018-10-11 LAB — I-STAT TROPONIN, ED: TROPONIN I, POC: 0.03 ng/mL (ref 0.00–0.08)

## 2018-10-11 LAB — PROTIME-INR
INR: 1.26
Prothrombin Time: 15.7 seconds — ABNORMAL HIGH (ref 11.4–15.2)

## 2018-10-11 LAB — BRAIN NATRIURETIC PEPTIDE: B NATRIURETIC PEPTIDE 5: 957.6 pg/mL — AB (ref 0.0–100.0)

## 2018-10-11 LAB — MRSA PCR SCREENING: MRSA BY PCR: NEGATIVE

## 2018-10-11 MED ORDER — SODIUM CHLORIDE 0.9% FLUSH
3.0000 mL | Freq: Two times a day (BID) | INTRAVENOUS | Status: DC
  Administered 2018-10-11 – 2018-10-16 (×9): 3 mL via INTRAVENOUS

## 2018-10-11 MED ORDER — DOCUSATE SODIUM 100 MG PO CAPS
100.0000 mg | ORAL_CAPSULE | Freq: Two times a day (BID) | ORAL | Status: DC | PRN
  Administered 2018-10-14: 100 mg via ORAL
  Filled 2018-10-11: qty 1

## 2018-10-11 MED ORDER — ONDANSETRON HCL 4 MG/2ML IJ SOLN
4.0000 mg | Freq: Four times a day (QID) | INTRAMUSCULAR | Status: DC | PRN
  Administered 2018-10-11 – 2018-10-14 (×3): 4 mg via INTRAVENOUS
  Filled 2018-10-11 (×3): qty 2

## 2018-10-11 MED ORDER — PROMETHAZINE HCL 25 MG/ML IJ SOLN
12.5000 mg | Freq: Four times a day (QID) | INTRAMUSCULAR | Status: DC | PRN
  Administered 2018-10-11: 12.5 mg via INTRAVENOUS
  Filled 2018-10-11: qty 1

## 2018-10-11 MED ORDER — ASPIRIN EC 81 MG PO TBEC
81.0000 mg | DELAYED_RELEASE_TABLET | Freq: Every day | ORAL | Status: DC
  Filled 2018-10-11 (×2): qty 1

## 2018-10-11 MED ORDER — ONDANSETRON HCL 4 MG PO TABS
4.0000 mg | ORAL_TABLET | Freq: Four times a day (QID) | ORAL | Status: DC | PRN
  Administered 2018-10-12: 4 mg via ORAL
  Filled 2018-10-11: qty 1

## 2018-10-11 MED ORDER — LISINOPRIL 2.5 MG PO TABS: 2.5000 mg | ORAL_TABLET | Freq: Every day | ORAL | Status: DC

## 2018-10-11 MED ORDER — SODIUM CHLORIDE 0.9 % IV BOLUS
500.0000 mL | Freq: Once | INTRAVENOUS | Status: AC
  Administered 2018-10-11: 500 mL via INTRAVENOUS

## 2018-10-11 MED ORDER — INSULIN ASPART 100 UNIT/ML ~~LOC~~ SOLN
0.0000 [IU] | Freq: Three times a day (TID) | SUBCUTANEOUS | Status: DC
  Administered 2018-10-12: 3 [IU] via SUBCUTANEOUS
  Administered 2018-10-12: 5 [IU] via SUBCUTANEOUS
  Administered 2018-10-12: 1 [IU] via SUBCUTANEOUS
  Administered 2018-10-13: 5 [IU] via SUBCUTANEOUS
  Administered 2018-10-13: 2 [IU] via SUBCUTANEOUS
  Administered 2018-10-13: 5 [IU] via SUBCUTANEOUS
  Administered 2018-10-14: 1 [IU] via SUBCUTANEOUS
  Administered 2018-10-14: 3 [IU] via SUBCUTANEOUS
  Administered 2018-10-15 (×2): 2 [IU] via SUBCUTANEOUS
  Administered 2018-10-15: 3 [IU] via SUBCUTANEOUS
  Administered 2018-10-16: 5 [IU] via SUBCUTANEOUS
  Administered 2018-10-16 (×2): 2 [IU] via SUBCUTANEOUS

## 2018-10-11 MED ORDER — HYOSCYAMINE SULFATE 0.5 MG/ML IJ SOLN
0.5000 mg | Freq: Once | INTRAMUSCULAR | Status: DC
  Filled 2018-10-11: qty 1

## 2018-10-11 MED ORDER — ATORVASTATIN CALCIUM 20 MG PO TABS
20.0000 mg | ORAL_TABLET | Freq: Every day | ORAL | Status: DC
  Administered 2018-10-12 – 2018-10-15 (×4): 20 mg via ORAL
  Filled 2018-10-11: qty 1
  Filled 2018-10-11: qty 2
  Filled 2018-10-11 (×2): qty 1
  Filled 2018-10-11: qty 2
  Filled 2018-10-11: qty 1
  Filled 2018-10-11: qty 2
  Filled 2018-10-11: qty 1
  Filled 2018-10-11: qty 2
  Filled 2018-10-11 (×2): qty 1

## 2018-10-11 MED ORDER — SENNOSIDES-DOCUSATE SODIUM 8.6-50 MG PO TABS: 1.0000 | ORAL_TABLET | Freq: Every evening | ORAL | Status: DC | PRN

## 2018-10-11 MED ORDER — SODIUM CHLORIDE (PF) 0.9 % IJ SOLN
INTRAMUSCULAR | Status: AC
  Administered 2018-10-13: 3 mL via INTRAVENOUS
  Filled 2018-10-11: qty 50

## 2018-10-11 MED ORDER — IOPAMIDOL (ISOVUE-370) INJECTION 76%
INTRAVENOUS | Status: AC
  Filled 2018-10-11: qty 100

## 2018-10-11 MED ORDER — IOPAMIDOL (ISOVUE-370) INJECTION 76%
100.0000 mL | Freq: Once | INTRAVENOUS | Status: AC | PRN
  Administered 2018-10-11: 100 mL via INTRAVENOUS

## 2018-10-11 MED ORDER — ENSURE ENLIVE PO LIQD
237.0000 mL | Freq: Three times a day (TID) | ORAL | Status: DC
  Administered 2018-10-12 – 2018-10-16 (×12): 237 mL via ORAL

## 2018-10-11 MED ORDER — HYDRALAZINE HCL 20 MG/ML IJ SOLN
10.0000 mg | Freq: Three times a day (TID) | INTRAMUSCULAR | Status: DC | PRN
  Administered 2018-10-11: 10 mg via INTRAVENOUS
  Filled 2018-10-11: qty 1

## 2018-10-11 MED ORDER — ALBUTEROL SULFATE (2.5 MG/3ML) 0.083% IN NEBU: 2.5000 mg | INHALATION_SOLUTION | RESPIRATORY_TRACT | Status: DC | PRN

## 2018-10-11 MED ORDER — INSULIN GLARGINE 100 UNIT/ML ~~LOC~~ SOLN
10.0000 [IU] | Freq: Every day | SUBCUTANEOUS | Status: DC
  Administered 2018-10-11 – 2018-10-15 (×5): 10 [IU] via SUBCUTANEOUS
  Filled 2018-10-11 (×7): qty 0.1

## 2018-10-11 MED ORDER — ACETAMINOPHEN 650 MG RE SUPP: 650.0000 mg | Freq: Four times a day (QID) | RECTAL | Status: DC | PRN

## 2018-10-11 MED ORDER — ONDANSETRON 4 MG PO TBDP
4.0000 mg | ORAL_TABLET | Freq: Once | ORAL | Status: AC
  Administered 2018-10-11: 4 mg via ORAL
  Filled 2018-10-11: qty 1

## 2018-10-11 MED ORDER — CARVEDILOL 12.5 MG PO TABS
12.5000 mg | ORAL_TABLET | Freq: Once | ORAL | Status: AC
  Administered 2018-10-11: 12.5 mg via ORAL
  Filled 2018-10-11: qty 1

## 2018-10-11 MED ORDER — ACETAMINOPHEN 325 MG PO TABS: 650.0000 mg | ORAL_TABLET | Freq: Four times a day (QID) | ORAL | Status: DC | PRN

## 2018-10-11 MED ORDER — GUAIFENESIN ER 600 MG PO TB12
600.0000 mg | ORAL_TABLET | Freq: Two times a day (BID) | ORAL | Status: DC
  Filled 2018-10-11: qty 1

## 2018-10-11 MED ORDER — ONDANSETRON HCL 4 MG/2ML IJ SOLN
4.0000 mg | Freq: Once | INTRAMUSCULAR | Status: AC
  Administered 2018-10-11: 4 mg via INTRAVENOUS
  Filled 2018-10-11: qty 2

## 2018-10-11 MED ORDER — LISINOPRIL 10 MG PO TABS
5.0000 mg | ORAL_TABLET | Freq: Every day | ORAL | Status: DC
  Administered 2018-10-12 – 2018-10-13 (×2): 5 mg via ORAL
  Filled 2018-10-11 (×3): qty 1

## 2018-10-11 MED ORDER — METOPROLOL TARTRATE 5 MG/5ML IV SOLN
5.0000 mg | INTRAVENOUS | Status: DC | PRN
  Administered 2018-10-11: 5 mg via INTRAVENOUS
  Filled 2018-10-11: qty 5

## 2018-10-11 MED ORDER — ENOXAPARIN SODIUM 40 MG/0.4ML ~~LOC~~ SOLN
40.0000 mg | SUBCUTANEOUS | Status: DC
  Administered 2018-10-11 – 2018-10-15 (×5): 40 mg via SUBCUTANEOUS
  Filled 2018-10-11 (×5): qty 0.4

## 2018-10-11 MED ORDER — CARVEDILOL 12.5 MG PO TABS
12.5000 mg | ORAL_TABLET | Freq: Two times a day (BID) | ORAL | Status: DC
  Administered 2018-10-12 (×2): 12.5 mg via ORAL
  Filled 2018-10-11 (×3): qty 1

## 2018-10-11 MED ORDER — INSULIN ASPART 100 UNIT/ML ~~LOC~~ SOLN
0.0000 [IU] | Freq: Every day | SUBCUTANEOUS | Status: DC
  Administered 2018-10-11: 2 [IU] via SUBCUTANEOUS
  Administered 2018-10-14: 5 [IU] via SUBCUTANEOUS

## 2018-10-11 MED ORDER — TRAMADOL HCL 50 MG PO TABS
50.0000 mg | ORAL_TABLET | Freq: Four times a day (QID) | ORAL | Status: DC | PRN
  Administered 2018-10-14: 50 mg via ORAL
  Filled 2018-10-11: qty 1

## 2018-10-11 MED ORDER — PANTOPRAZOLE SODIUM 40 MG IV SOLR
40.0000 mg | INTRAVENOUS | Status: DC
  Administered 2018-10-11 – 2018-10-13 (×3): 40 mg via INTRAVENOUS
  Filled 2018-10-11 (×3): qty 40

## 2018-10-11 NOTE — ED Provider Notes (Signed)
[facility] DEPT Provider Note   CSN: [id number] Arrival date & time: 10/11/18  0830     History   Chief Complaint Chief Complaint  Patient presents with  . Hematemesis    HPI Kristen Lozano is a 62 y.o. female.  62 yo F with a chief complaint of diffuse abdominal pain and vomiting.  Going on for the past couple days.  Describes it as the food that she ate.  Denies dark or bilious emesis denies gross blood.  Denies diarrhea or constipation.  Complaining of diffuse burning abdominal pain.  Difficulty tolerating p.o. at home.  The patient has a scheduled left heart cath this week and has stopped taking her potassium supplementation, her Lasix, her Coumadin.  The history is provided by the patient.  Illness  This is a new problem. The current episode started 2 days ago. The problem occurs constantly. The problem has not changed since onset.Associated symptoms include abdominal pain. Pertinent negatives include no chest pain, no headaches and no shortness of breath. Nothing aggravates the symptoms. Nothing relieves the symptoms. She has tried nothing for the symptoms. The treatment provided no relief.    Past Medical History:  Diagnosis Date  . Allergic rhinitis    Requires cetirizine, singulair, and fluticasone.  . Anxiety    Has been on Xanax since 2009. Uses it for stress, anxiety, and insomnia. No contract yet.  . Arthritis   . CHF (congestive heart failure) (HCC)    EF 35% after stroke, presumed ischemic  . Chronic pain    Has OA of knees B. No Xrays in echart. Not requiring narcotics.  . Colitis 12/2017  . Depression   . Diabetes mellitus    Type 2, non insulin dependent. Was dx'd prior to 2008.  Marland Kitchen Hyperglycemia   . Hyperlipemia   . Hypertension   . Sickle cell trait ([place])   . 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 HLD  (hyperlipidemia) 09/21/2018  . GERD (gastroesophageal reflux disease) 09/21/2018  . Nausea and vomiting 09/08/2018  . Nausea with vomiting 09/07/2018  . Dyspnea   . Hypomagnesemia   . Sinus tachycardia   . Malnutrition of moderate degree 08/13/2018  . Enteritis   . Pleural effusion   . Nausea 08/11/2018  . Acute systolic CHF (congestive heart failure), NYHA class 3 ([place])   . Dilated cardiomyopathy ([place])   . Nausea & vomiting 08/09/2018  . 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 Abdominal pain 08/09/2018  . Infectious colitis 01/11/2018  . Hypokalemia 01/11/2018  . Uncontrolled type 2 diabetes mellitus with hyperglycemia ([place])   . Chronic combined systolic and diastolic CHF (congestive heart failure) ([place]) 12/16/2016  . Monitoring for long-term anticoagulant use 11/14/2016  . DKA (diabetic ketoacidoses) ([place]) 11/01/2016  . Diabetes mellitus 11/01/2016  . Elevated troponin   . Cardiomyopathy, ischemic   . 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 Cognitive deficit due to recent stroke 10/18/2014  . History of completed stroke 10/18/2014  . Abnormal stress test 10/17/2014  . Dysphagia, pharyngoesophageal phase 09/23/2014  . Abnormal CT scan 09/23/2014  . Dilated bile duct 09/23/2014  . Urinary retention 09/07/2014  . 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 Stroke ([place]) 09/05/2014  . Cerebral infarction ([place]) 09/05/2014  . Non compliance w medication regimen 08/10/2012  . Fatigue  02/18/2012  . Essential hypertension 03/22/2011  . Healthcare maintenance 03/22/2011  . Chronic pain   . ALLERGIC RHINITIS, SEASONAL 03/14/2008  . SICKLE CELL TRAIT 10/31/2006  . Anxiety state 10/31/2006  . DEPRESSION 10/31/2006    Past Surgical History:  Procedure Laterality Date  . BIOPSY  09/23/2018   Procedure: BIOPSY;  Surgeon: Yetta Flock, MD;  Location: [facility] ENDOSCOPY;  Service: Gastroenterology;;  . ESOPHAGOGASTRODUODENOSCOPY  (EGD) WITH PROPOFOL N/A 09/23/2018   Procedure: ESOPHAGOGASTRODUODENOSCOPY (EGD) WITH PROPOFOL;  Surgeon: Yetta Flock, MD;  Location: [facility] ENDOSCOPY;  Service: Gastroenterology;  Laterality: N/A;  . TEE WITHOUT CARDIOVERSION N/A 09/06/2014   Procedure: TRANSESOPHAGEAL ECHOCARDIOGRAM (TEE);  Surgeon: Pixie Casino, MD;  Location: [facility] ENDOSCOPY;  Service: Cardiovascular;  Laterality: N/A;     OB History   None      Home Medications    Prior to Admission medications   Medication Sig Start Date End Date Taking? Authorizing Provider  ASPIRIN LOW DOSE 81 MG EC tablet Take 1 tablet (81 mg total) by mouth daily. Patient taking differently: Take 81 mg by mouth daily.  01/22/18  Yes Hilty, Nadean Corwin, MD  atorvastatin (LIPITOR) 20 MG tablet Take 1 tablet (20 mg total) by mouth daily at 6 PM. Please schedule appointment for refills. 01/02/18  Yes Hilty, Nadean Corwin, MD  carvedilol (COREG) 12.5 MG tablet Take 12.5 mg by mouth 2 (two) times daily with a meal.  09/18/18  Yes [provider]  docusate sodium (COLACE) 100 MG capsule Take 1 capsule (100 mg total) by mouth 2 (two) times daily. Patient taking differently: Take 100 mg by mouth 2 (two) times daily as needed for mild constipation.  09/09/18  Yes Oretha Milch D, MD  feeding supplement, ENSURE ENLIVE, (ENSURE ENLIVE) LIQD Take 237 mLs by mouth 3 (three) times daily between meals. 08/17/18  Yes Oretha Milch D, MD  furosemide (LASIX) 20 MG tablet Take 1 tablet (20 mg total) by mouth daily. 09/02/18 09/02/19 Yes Georgette Shell, MD  insulin glargine (LANTUS) 100 UNIT/ML injection Inject 0.1 mLs (10 Units total) into the skin at bedtime. 09/25/18  Yes Oswald Hillock, MD  lisinopril (PRINIVIL,ZESTRIL) 2.5 MG tablet Take 2.5 mg by mouth daily.  09/18/18  Yes [provider]  metFORMIN (GLUCOPHAGE-XR) 500 MG 24 hr tablet Take 1,000 mg by mouth 2 (two) times daily.  08/06/18  Yes [provider]  Multiple  Vitamins-Minerals (MULTIVITAMIN ADULT) TABS Take 1 tablet by mouth daily.   Yes [provider]  ondansetron (ZOFRAN) 4 MG tablet Take 1 tablet (4 mg total) by mouth every 12 (twelve) hours. 09/25/18  Yes Oswald Hillock, MD  potassium chloride (K-DUR) 10 MEQ tablet Take 1 tablet (10 mEq total) by mouth daily. 09/02/18  Yes Georgette Shell, MD  warfarin (COUMADIN) 5 MG tablet Take 1 to 1.5 tablets by mouth daily as directed by coumadin clinic 10/06/18  Yes Hilty, Nadean Corwin, MD  polyethylene glycol (MIRALAX / GLYCOLAX) packet Take 17 g by mouth daily as needed. Patient not taking: Reported on 10/11/2018 09/09/18   Desiree Hane, MD    Family History Family History  Problem Relation Age of Onset  . Diabetes Mother   . Hypertension Mother   . Heart disease Father   . Heart attack Father   . Hypertension Father   . Diabetes Father   . Ovarian cancer Sister   . Liver cancer Sister   . Sickle cell anemia Daughter   .  Schizophrenia Daughter   . Hypertension Sister   . Hypertension Brother   . Hypertension Daughter   . Kidney disease Sister        x2  . Kidney disease Brother   . Stroke Neg Hx   . Esophageal cancer Neg Hx   . Colon cancer Neg Hx   . Colon polyps Neg Hx     Social History Social History   Tobacco Use  . Smoking status: Never Smoker  . Smokeless tobacco: Never Used  Substance Use Topics  . Alcohol use: No    Alcohol/week: 0.0 standard drinks  . Drug use: No     Allergies   Patient has no known allergies.   Review of Systems Review of Systems  Constitutional: Negative for chills and fever.  HENT: Negative for congestion and rhinorrhea.   Eyes: Negative for redness and visual disturbance.  Respiratory: Negative for shortness of breath and wheezing.   Cardiovascular: Negative for chest pain and palpitations.  Gastrointestinal: Positive for abdominal pain, nausea and vomiting.  Genitourinary: Negative for dysuria and urgency.    Musculoskeletal: Negative for arthralgias and myalgias.  Skin: Negative for pallor and wound.  Neurological: Negative for dizziness and headaches.     Physical Exam Updated Vital Signs BP (!) 156/113   Pulse (!) 123   Temp 97.7 F (36.5 C) (Oral)   Resp (!) 21   Ht 5\' 3"  (1.6 m)   Wt 51.7 kg   SpO2 98%   BMI 20.19 kg/m   Physical Exam  Constitutional: She is oriented to person, place, and time. She appears well-developed and well-nourished. No distress.  Chronically ill-appearing  HENT:  Head: Normocephalic and atraumatic.  Eyes: Pupils are equal, round, and reactive to light. EOM are normal.  Neck: Normal range of motion. Neck supple.  Cardiovascular: Normal rate and regular rhythm. Exam reveals no gallop and no friction rub.  No murmur heard. Pulmonary/Chest: Effort normal. She has no wheezes. She has no rales.  Abdominal: Soft. She exhibits no distension and no mass. There is tenderness (mild diffuse). There is no guarding.  Musculoskeletal: She exhibits no edema or tenderness.  Neurological: She is alert and oriented to person, place, and time.  Skin: Skin is warm and dry. She is not diaphoretic.  Psychiatric: She has a normal mood and affect. Her behavior is normal.  Nursing note and vitals reviewed.    ED Treatments / Results  Labs (all labs ordered are listed, but only abnormal results are displayed) Labs Reviewed  PROTIME-INR - Abnormal; Notable for the following components:      Result Value   Prothrombin Time 15.7 (*)    All other components within normal limits  CBC WITH DIFFERENTIAL/PLATELET - Abnormal; Notable for the following components:   RBC 6.17 (*)    MCV 68.6 (*)    MCH 21.9 (*)    RDW 16.9 (*)    All other components within normal limits  COMPREHENSIVE METABOLIC PANEL - Abnormal; Notable for the following components:   Glucose, Bld 232 (*)    Calcium 8.5 (*)    Total Protein 6.3 (*)    Albumin 3.3 (*)    Total Bilirubin 1.5 (*)    All  other components within normal limits  LIPASE, BLOOD  CBC WITH DIFFERENTIAL/PLATELET  I-STAT TROPONIN, ED    EKG EKG Interpretation  Date/Time:  Sunday October 11 2018 09:11:42 EST Ventricular Rate:  129 PR Interval:    QRS Duration: 79 QT Interval:  325 QTC Calculation: 710 R Axis:   42 Text Interpretation:  Sinus tachycardia Probable right atrial enlargement Consider left ventricular hypertrophy Nonspecific T abnormalities, lateral leads No significant change since last tracing Confirmed by Deno Etienne [phone]) on 10/11/2018 9:13:57 AM   Radiology No results found.  Procedures Procedures (including critical care time)  Medications Ordered in ED Medications  ondansetron (ZOFRAN-ODT) disintegrating tablet 4 mg (4 mg Oral Given 10/11/18 0859)  sodium chloride 0.9 % bolus 500 mL (0 mLs Intravenous Stopped 10/11/18 0946)  sodium chloride 0.9 % bolus 500 mL (0 mLs Intravenous Stopped 10/11/18 1148)  carvedilol (COREG) tablet 12.5 mg (12.5 mg Oral Given 10/11/18 1115)  sodium chloride 0.9 % bolus 500 mL (0 mLs Intravenous Stopped 10/11/18 1441)  ondansetron (ZOFRAN) injection 4 mg (4 mg Intravenous Given 10/11/18 1535)     Initial Impression / Assessment and Plan / ED Course  I have reviewed the triage vital signs and the nursing notes.  Pertinent labs & imaging results that were available during my care of the patient were reviewed by me and considered in my medical decision making (see chart for details).     62 yo F with a chief complaint of nausea and vomiting.  Going on for the past couple days.  Having some diffuse burning abdominal pain as well.  Initial documented vital signs with a heart rate in the 130s.  Will obtain labs give a small bolus of fluids reassess.  Patient is got 3 separate fluid boluses of 500 cc.  Continues to have no issues with breathing heart rate is come down into the 115 range.  Continues to be sinus tachycardia.  Lab work is reassuring, mildly  elevated glucose hemoglobin is stable lipase and troponin are normal.  Her INR is subtherapeutic which is expected as she is off of her Coumadin for her upcoming procedure.  She initially was able to eat but now is vomiting again.  Will discuss with the hospitalist about her persistent tachycardia.  Hospitalist is requesting me to discuss the case with cardiology.  Patient multiple times with no response over the past hour.  Care turned over to Dr. Ashok Cordia please see his note for further details care in the ED.  The patients results and plan were reviewed and discussed.   Any x-rays performed were independently reviewed by myself.   Differential diagnosis were considered with the presenting HPI.  Medications  ondansetron (ZOFRAN-ODT) disintegrating tablet 4 mg (4 mg Oral Given 10/11/18 0859)  sodium chloride 0.9 % bolus 500 mL (0 mLs Intravenous Stopped 10/11/18 0946)  sodium chloride 0.9 % bolus 500 mL (0 mLs Intravenous Stopped 10/11/18 1148)  carvedilol (COREG) tablet 12.5 mg (12.5 mg Oral Given 10/11/18 1115)  sodium chloride 0.9 % bolus 500 mL (0 mLs Intravenous Stopped 10/11/18 1441)  ondansetron (ZOFRAN) injection 4 mg (4 mg Intravenous Given 10/11/18 1535)    Vitals:   10/11/18 1513 10/11/18 1514 10/11/18 1515 10/11/18 1600  BP:    (!) 156/113  Pulse: (!) 110 (!) 110 (!) 117 (!) 123  Resp: 17 16 (!) 23 (!) 21  Temp:      TempSrc:      SpO2: 97% 97% 100% 98%  Weight:      Height:        Final diagnoses:  Sinus tachycardia  Nausea and vomiting, intractability of vomiting not specified, unspecified vomiting type    Admission/ observation were discussed with the admitting physician, patient and/or family and they are comfortable  with the plan.    Final Clinical Impressions(s) / ED Diagnoses   Final diagnoses:  Sinus tachycardia  Nausea and vomiting, intractability of vomiting not specified, unspecified vomiting type    ED Discharge Orders    None       Deno Etienne,  DO 10/11/18 1654

## 2018-10-11 NOTE — ED Notes (Signed)
Lab called inquiring about lab work sent down

## 2018-10-11 NOTE — ED Notes (Signed)
Lab-work recollected and sent to lab

## 2018-10-11 NOTE — H&P (Signed)
History and Physical  Kristen Lozano:[id number] DOB: 1956-11-17 DOA: 10/11/2018  Referring physician: EDP PCP: Audley Hose, MD  Patient coming from: Home & is able to ambulate  Chief Complaint: Intractable nausea/vomiting, abdominal pain  HPI: Kristen Lozano is a 62 y.o. female with medical history significant for hypertension, diabetes mellitus type 2, history of CVA, LV thrombus, CHF, depression, anxiety, hyperlipidemia, presents to the ED complaining of intractable nausea/vomiting, with generalized abdominal pain that has been ongoing for the past 2 days.  Patient describes vomitus as clear, denies seeing any blood.  Reports generalized abdominal pain, worse around the epigastric region.  Patient also reports some cough and runny nose.  Bouts of coughing usually elicits vomiting. Reports some frontal headache.  Patient denies any diarrhea, dysuria, fever/chills, worsening shortness of breath, reports some chest pain, but points at the epigastric region.  Patient does report compliance to her medications.  Husband at bedside, assisted with the history.  Denies any illicit drug use, alcohol use.  Of note, due to significant EF dropped to 20 to 25%, cardiology planned cardiac cath to be done on 10/14/2018, was instructed to hold Coumadin for now.  Due to above symptoms patient presented to the ER.  Patient was recently discharged on 09/25/18 with similar complaints.  ED Course: Patient noted to be tachycardic with heart rate in the 130s, hypertensive with diastolic in the 542H, afebrile, labs unremarkable.  ED Trop negative, EKG showing sinus tachycardia with some T wave inversions in the lateral leads.  EDP spoke to cardiology who recommended admission here at [facility] for stabilization. Pt admitted for further management.  Review of Systems: Review of systems are otherwise negative   Past Medical History:  Diagnosis Date  . Allergic rhinitis    Requires cetirizine, singulair, and  fluticasone.  . Anxiety    Has been on Xanax since 2009. Uses it for stress, anxiety, and insomnia. No contract yet.  . Arthritis   . CHF (congestive heart failure) (HCC)    EF 35% after stroke, presumed ischemic  . Chronic pain    Has OA of knees B. No Xrays in echart. Not requiring narcotics.  . Colitis 12/2017  . Depression   . Diabetes mellitus    Type 2, non insulin dependent. Was dx'd prior to 2008.  Marland Kitchen Hyperglycemia   . Hyperlipemia   . Hypertension   . Sickle cell trait ([place])   . Stroke [facility]) 09/05/14   Dominant left MCA infarcts secondary to unknown embolic source    Past Surgical History:  Procedure Laterality Date  . BIOPSY  09/23/2018   Procedure: BIOPSY;  Surgeon: Yetta Flock, MD;  Location: [facility] ENDOSCOPY;  Service: Gastroenterology;;  . ESOPHAGOGASTRODUODENOSCOPY (EGD) WITH PROPOFOL N/A 09/23/2018   Procedure: ESOPHAGOGASTRODUODENOSCOPY (EGD) WITH PROPOFOL;  Surgeon: Yetta Flock, MD;  Location: [facility] ENDOSCOPY;  Service: Gastroenterology;  Laterality: N/A;  . TEE WITHOUT CARDIOVERSION N/A 09/06/2014   Procedure: TRANSESOPHAGEAL ECHOCARDIOGRAM (TEE);  Surgeon: Pixie Casino, MD;  Location: [facility] ENDOSCOPY;  Service: Cardiovascular;  Laterality: N/A;    Social History:  reports that she has never smoked. She has never used smokeless tobacco. She reports that she does not drink alcohol or use drugs.   No Known Allergies  Family History  Problem Relation Age of Onset  . Diabetes Mother   . Hypertension Mother   . Heart disease Father   . Heart attack Father   . Hypertension Father   . Diabetes Father   .  Ovarian cancer Sister   . Liver cancer Sister   . Sickle cell anemia Daughter   . Schizophrenia Daughter   . Hypertension Sister   . Hypertension Brother   . Hypertension Daughter   . Kidney disease Sister        x2  . Kidney disease Brother   . Stroke Neg Hx   . Esophageal cancer Neg Hx   . Colon cancer Neg Hx   . Colon polyps Neg Hx         Prior to Admission medications   Medication Sig Start Date End Date Taking? Authorizing Provider  ASPIRIN LOW DOSE 81 MG EC tablet Take 1 tablet (81 mg total) by mouth daily. Patient taking differently: Take 81 mg by mouth daily.  01/22/18  Yes Hilty, Nadean Corwin, MD  atorvastatin (LIPITOR) 20 MG tablet Take 1 tablet (20 mg total) by mouth daily at 6 PM. Please schedule appointment for refills. 01/02/18  Yes Hilty, Nadean Corwin, MD  carvedilol (COREG) 12.5 MG tablet Take 12.5 mg by mouth 2 (two) times daily with a meal.  09/18/18  Yes [provider]  docusate sodium (COLACE) 100 MG capsule Take 1 capsule (100 mg total) by mouth 2 (two) times daily. Patient taking differently: Take 100 mg by mouth 2 (two) times daily as needed for mild constipation.  09/09/18  Yes Oretha Milch D, MD  feeding supplement, ENSURE ENLIVE, (ENSURE ENLIVE) LIQD Take 237 mLs by mouth 3 (three) times daily between meals. 08/17/18  Yes Oretha Milch D, MD  furosemide (LASIX) 20 MG tablet Take 1 tablet (20 mg total) by mouth daily. 09/02/18 09/02/19 Yes Georgette Shell, MD  insulin glargine (LANTUS) 100 UNIT/ML injection Inject 0.1 mLs (10 Units total) into the skin at bedtime. 09/25/18  Yes Oswald Hillock, MD  lisinopril (PRINIVIL,ZESTRIL) 2.5 MG tablet Take 2.5 mg by mouth daily.  09/18/18  Yes [provider]  metFORMIN (GLUCOPHAGE-XR) 500 MG 24 hr tablet Take 1,000 mg by mouth 2 (two) times daily.  08/06/18  Yes [provider]  Multiple Vitamins-Minerals (MULTIVITAMIN ADULT) TABS Take 1 tablet by mouth daily.   Yes [provider]  ondansetron (ZOFRAN) 4 MG tablet Take 1 tablet (4 mg total) by mouth every 12 (twelve) hours. 09/25/18  Yes Oswald Hillock, MD  potassium chloride (K-DUR) 10 MEQ tablet Take 1 tablet (10 mEq total) by mouth daily. 09/02/18  Yes Georgette Shell, MD  warfarin (COUMADIN) 5 MG tablet Take 1 to 1.5 tablets by mouth daily as directed by coumadin clinic 10/06/18   Yes Hilty, Nadean Corwin, MD  polyethylene glycol (MIRALAX / GLYCOLAX) packet Take 17 g by mouth daily as needed. Patient not taking: Reported on 10/11/2018 09/09/18   Desiree Hane, MD    Physical Exam: BP (!) 156/113   Pulse (!) 123   Temp 97.7 F (36.5 C) (Oral)   Resp (!) 21   Ht 5' 3"  (1.6 m)   Wt 51.7 kg   SpO2 98%   BMI 20.19 kg/m   General: Alert, awake, appears lethargic, cachectic Eyes: Normal ENT: Normal, missing dentition Neck: Supple Cardiovascular: S1, S2 present, tachycardic Respiratory: Diminished breath sounds bilaterally Abdomen: Soft, generalized tenderness, nondistended, bowel sounds present Skin: Normal Musculoskeletal: No pedal edema bilaterally Psychiatric: Normal mood Neurologic: No focal neurologic deficits noted, strength normal and equal in all extremities          Labs on Admission:  Basic Metabolic Panel: Recent Labs  Lab 10/11/18  1300  NA 137  K 3.9  CL 104  CO2 24  GLUCOSE 232*  BUN 12  CREATININE 0.69  CALCIUM 8.5*   Liver Function Tests: Recent Labs  Lab 10/11/18 1300  AST 24  ALT 33  ALKPHOS 38  BILITOT 1.5*  PROT 6.3*  ALBUMIN 3.3*   Recent Labs  Lab 10/11/18 1300  LIPASE 23   No results for input(s): AMMONIA in the last 168 hours. CBC: Recent Labs  Lab 10/11/18 1300  WBC 9.5  NEUTROABS 6.7  HGB 13.5  HCT 42.3  MCV 68.6*  PLT 273   Cardiac Enzymes: No results for input(s): CKTOTAL, CKMB, CKMBINDEX, TROPONINI in the last 168 hours.  BNP (last 3 results) Recent Labs    09/01/18 1600 09/08/18 0240 09/22/18 0136  BNP 1,235.6* 493.0* 1,495.9*    ProBNP (last 3 results) No results for input(s): PROBNP in the last 8760 hours.  CBG: No results for input(s): GLUCAP in the last 168 hours.  Radiological Exams on Admission: No results found.  EKG: Independently reviewed.  Sinus tachycardia with T wave inversions in the lateral leads  Assessment/Plan Present on Admission: . Nausea and vomiting .  Anxiety state . Essential hypertension . Cerebral infarction ([place]) . LV (left ventricular) mural thrombus following MI ([place]) . Chronic combined systolic and diastolic CHF (congestive heart failure) ([place]) . Uncontrolled type 2 diabetes mellitus with hyperglycemia ([place]) . Sinus tachycardia . HLD (hyperlipidemia) . GERD (gastroesophageal reflux disease)  Principal Problem:   Nausea and vomiting Active Problems:   Anxiety state   Essential hypertension   Cerebral infarction (HCC)   LV (left ventricular) mural thrombus following MI (HCC)   Chronic combined systolic and diastolic CHF (congestive heart failure) ([place])   Uncontrolled type 2 diabetes mellitus with hyperglycemia (HCC)   Sinus tachycardia   HLD (hyperlipidemia)   GERD (gastroesophageal reflux disease)  Intractable nausea/vomiting, lethargic Reports associated generalized abdominal pain, headaches Unknown etiology, ??Uncontrolled hypertension, although reports compliance to medication LFTs, except T bili which is at baseline, lipase, BMP, CBC all within normal limits ACS rule out: ED Trop negative, EKG with sinus tachy, T wave inversion in lateral leads CT abdomen/pelvis, CT head both pending Had multiple work-ups for N/V: Negative CT on previous admission, gastric emptying study negative, EGD done on 09/23/2018 was also normal, with biopsy of stomach showing chronic mild gastritis, negative for H. pylori Status post 1 L of fluids in the ED, will hold off giving more fluids due to EF of 20 to 25% Anti-emetics, pain management, BP control Clear liquid diet If persist, may consult GI in a.m.  Uncontrolled hypertension On presentation DBP in the 120s Reports compliance to meds Increased home lisinopril to 5 mg, continue home Coreg 12.5 twice daily IV hydralazine PRN Monitor in SDU  Sinus tachycardia Likely due to ??intractable nausea/vomiting Vs dehydration, ??PNA EKG with sinus tachycardia, T wave inversion in lateral  leads CTA chest pending Monitor in SDU  Chronic systolic and diastolic HF/pulmonary hypertension/LV thrombus Appears somewhat dry Echo done on 07/2018 showed EF of 20 to 25%, with a PA peak pressure of 53 mmHg, significantly reduced from last echo in 2017 ACS rule out Cardiology planned for cardiac cath on 10/14/2018, was instructed to hold home warfarin. EDP spoke to cardiology on call, recommended staying at [facility] until stable Continue meds as above, continue to hold warfarin, hold Lasix Consult cardiology in a.m. Strict I's and O's, daily weights  Diabetes mellitus type 2 Last A1c on 10/19 was  7.4 SSI, Accu-Cheks, Lantus 10 at bedtime Hold home metformin  History of CVA Continue statins  Hyperlipidemia Lipid profile in a.m. Continue statins  GERD Start IV PPI         DVT prophylaxis: Lovenox for now  Code Status: Full  Family Communication: Husband at bedside  Disposition Plan: To be determined  Consults called: EDP spoke to cardiology  Admission status: Inpatient    Alma Friendly MD [facility]   If 7PM-7AM, please contact night-coverage [email or web address]  10/11/2018, 5:40 PM

## 2018-10-11 NOTE — ED Provider Notes (Signed)
Signed out by Dr Tyrone Nine that he had contact hospitalists to admit, who had requests he speak with cardiology at [facility] first, to make sure they do not want patient transferred/admitted at [facility].  I spoke with cardiologist on call - she indicates to admit to medical service here. If patients acute medical symptoms were resolved by prior to Wednesday patient could still have her cath as scheduled, but if current symptoms not improved/resolved by then, likely cath will need to be re-scheduled.  Hospitalist paged for admission.     Lajean Saver, MD 10/11/18 [phone]

## 2018-10-11 NOTE — ED Notes (Signed)
ED TO INPATIENT HANDOFF REPORT  Name/Age/Gender Kristen Lozano 62 y.o. female  Code Status Code Status History    Date Active Date Inactive Code Status Order ID Comments User Context   09/22/2018 0004 09/25/2018 2122 Full Code [id number]  Ivor Costa, MD ED   09/08/2018 0206 09/09/2018 2157 Full Code [id number]  Bennie Pierini, MD Inpatient   09/01/2018 1746 09/02/2018 1749 Full Code [id number]  Cristy Folks, MD ED   08/09/2018 2140 08/17/2018 2106 Full Code [id number]  Rise Patience, MD ED   01/11/2018 0125 01/15/2018 1528 Full Code [id number]  Vianne Bulls, MD ED   10/29/2016 0010 11/01/2016 1654 Full Code [id number]  Edwin Dada, MD Inpatient   09/05/2014 0221 09/10/2014 1635 Full Code [id number]  Berle Mull, MD Inpatient    Advance Directive Documentation     Most Recent Value  Type of Advance Directive  Healthcare Power of Attorney, Living will  Pre-existing out of facility DNR order (yellow form or pink MOST form)  -  "MOST" Form in Place?  -      Home/SNF/Other Home  Chief Complaint emesis;cough  Level of Care/Admitting Diagnosis ED Disposition    ED Disposition Condition [place]: [place] [100102]  Level of Care: Stepdown [14]  Admit to SDU based on following criteria: Hemodynamic compromise or significant risk of instability:  Patient requiring short term acute titration and management of vasoactive drips, and invasive monitoring (i.e., CVP and Arterial line).  Diagnosis: Nausea and vomiting, intractability of vomiting not specified, unspecified vomiting type [[id number]]  Admitting Physician: Alma Friendly [[id number]]  Attending Physician: Alma Friendly [[id number]]  Estimated length of stay: past midnight tomorrow  Certification:: I certify this patient will need inpatient services for at least 2 midnights  PT Class (Do Not Modify): Inpatient [101]  PT Acc Code (Do Not Modify): Private [1]        Medical History Past Medical History:  Diagnosis Date  . Allergic rhinitis    Requires cetirizine, singulair, and fluticasone.  . Anxiety    Has been on Xanax since 2009. Uses it for stress, anxiety, and insomnia. No contract yet.  . Arthritis   . CHF (congestive heart failure) (HCC)    EF 35% after stroke, presumed ischemic  . Chronic pain    Has OA of knees B. No Xrays in echart. Not requiring narcotics.  . Colitis 12/2017  . Depression   . Diabetes mellitus    Type 2, non insulin dependent. Was dx'd prior to 2008.  Marland Kitchen Hyperglycemia   . Hyperlipemia   . Hypertension   . Sickle cell trait ([place])   . Stroke [facility]) 09/05/14   Dominant left MCA infarcts secondary to unknown embolic source     Allergies No Known Allergies  IV Location/Drains/Wounds Patient Lines/Drains/Airways Status   Active Line/Drains/Airways    Name:   Placement date:   Placement time:   Site:   Days:   Peripheral IV 09/21/18 Left Antecubital   09/21/18    1837    Antecubital   20   Peripheral IV 10/11/18 Left Forearm   10/11/18    0910    Forearm   less than 1   Peripheral IV 10/11/18 Left Antecubital   10/11/18    1841    Antecubital   less than 1          Labs/Imaging Results for orders placed or performed during the hospital encounter of  10/11/18 (from the past 48 hour(s))  Protime-INR     Status: Abnormal   Collection Time: 10/11/18  8:50 AM  Result Value Ref Range   Prothrombin Time 15.7 (H) 11.4 - 15.2 seconds   INR 1.26     Comment: Performed at [facility], [place] [address], [place], [place] [address]  I-stat troponin, ED     Status: None   Collection Time: 10/11/18  9:27 AM  Result Value Ref Range   Troponin i, poc 0.03 0.00 - 0.08 ng/mL   Comment 3            Comment: Due to the release kinetics of cTnI, a negative result within the first hours of the onset of symptoms does not rule out myocardial infarction with certainty. If myocardial infarction is still  suspected, repeat the test at appropriate intervals.   CBC with Differential/Platelet     Status: Abnormal   Collection Time: 10/11/18  1:00 PM  Result Value Ref Range   WBC 9.5 4.0 - 10.5 K/uL   RBC 6.17 (H) 3.87 - 5.11 MIL/uL   Hemoglobin 13.5 12.0 - 15.0 g/dL   HCT 42.3 36.0 - 46.0 %   MCV 68.6 (L) 80.0 - 100.0 fL   MCH 21.9 (L) 26.0 - 34.0 pg   MCHC 31.9 30.0 - 36.0 g/dL   RDW 16.9 (H) 11.5 - 15.5 %   Platelets 273 150 - 400 K/uL   nRBC 0.0 0.0 - 0.2 %   Neutrophils Relative % 71 %   Neutro Abs 6.7 1.7 - 7.7 K/uL   Lymphocytes Relative 19 %   Lymphs Abs 1.8 0.7 - 4.0 K/uL   Monocytes Relative 9 %   Monocytes Absolute 0.9 0.1 - 1.0 K/uL   Eosinophils Relative 0 %   Eosinophils Absolute 0.0 0.0 - 0.5 K/uL   Basophils Relative 1 %   Basophils Absolute 0.1 0.0 - 0.1 K/uL   Immature Granulocytes 0 %   Abs Immature Granulocytes 0.04 0.00 - 0.07 K/uL    Comment: Performed at [facility], [place] [address] [place], [place] [address]  Comprehensive metabolic panel     Status: Abnormal   Collection Time: 10/11/18  1:00 PM  Result Value Ref Range   Sodium 137 135 - 145 mmol/L   Potassium 3.9 3.5 - 5.1 mmol/L   Chloride 104 98 - 111 mmol/L   CO2 24 22 - 32 mmol/L   Glucose, Bld 232 (H) 70 - 99 mg/dL   BUN 12 8 - 23 mg/dL   Creatinine, Ser 0.69 0.44 - 1.00 mg/dL   Calcium 8.5 (L) 8.9 - 10.3 mg/dL   Total Protein 6.3 (L) 6.5 - 8.1 g/dL   Albumin 3.3 (L) 3.5 - 5.0 g/dL   AST 24 15 - 41 U/L   ALT 33 0 - 44 U/L   Alkaline Phosphatase 38 38 - 126 U/L   Total Bilirubin 1.5 (H) 0.3 - 1.2 mg/dL   GFR calc non Af Amer >60 >60 mL/min   GFR calc Af Amer >60 >60 mL/min    Comment: (NOTE) The eGFR has been calculated using the CKD EPI equation. This calculation has not been validated in all clinical situations. eGFR's persistently <60 mL/min signify possible Chronic Kidney Disease.    Anion gap 9 5 - 15    Comment: Performed at [facility], [place] [address], [place], [place] [address]  Lipase, blood     Status: None  Collection Time: 10/11/18  1:00 PM  Result Value Ref Range   Lipase 23 11 - 51 U/L    Comment: Performed at [facility], [place] [address], [place], [place] [address]   No results found.  Pending Labs Unresulted Labs (From admission, onward)    Start     Ordered   10/12/18 0500  Lipid panel  Tomorrow morning,   R     10/11/18 1825   10/11/18 1758  Brain natriuretic peptide  Once,   R     10/11/18 1757   10/11/18 1749  Ethanol  Once,   R     10/11/18 1748   10/11/18 1748  Urinalysis, Routine w reflex microscopic  ONCE - STAT,   R     10/11/18 1747   10/11/18 1748  Urine Culture  ONCE - STAT,   R     10/11/18 1747   10/11/18 1748  Urine rapid drug screen (hosp performed)  ONCE - STAT,   R     10/11/18 1747   10/11/18 1739  Troponin I - Now Then Q6H  Now then every 6 hours,   R     10/11/18 1738   10/11/18 1735  Respiratory Panel by PCR  (Respiratory virus panel with precautions)  ONCE - STAT,   R     10/11/18 1734   10/11/18 0850  CBC with Differential  ONCE - STAT,   STAT     10/11/18 0849   Signed and Held  Basic metabolic panel  Tomorrow morning,   R     Signed and Held   Signed and Held  CBC  Tomorrow morning,   R     Signed and Held          Vitals/Pain Today's Vitals   10/11/18 1515 10/11/18 1600 10/11/18 1830 10/11/18 1900  BP:  (!) 156/113 (!) 147/106 (!) 156/106  Pulse: (!) 117 (!) 123 (!) 126 (!) 118  Resp: (!) 23 (!) _0 Temp:      TempSrc:      SpO2: 100% 98% 98% 98%  Weight:      Height:      PainSc:        Isolation Precautions Droplet precaution  Medications Medications  aspirin EC tablet 81 mg (has no administration in time range)  atorvastatin (LIPITOR) tablet 20 mg (has no administration in time range)  carvedilol (COREG) tablet 12.5 mg (has no administration in time range)  docusate sodium (COLACE) capsule 100 mg (has no administration in time  range)  feeding supplement (ENSURE ENLIVE) (ENSURE ENLIVE) liquid 237 mL (has no administration in time range)  insulin glargine (LANTUS) injection 10 Units (has no administration in time range)  hydrALAZINE (APRESOLINE) injection 10 mg (has no administration in time range)  insulin aspart (novoLOG) injection 0-9 Units (has no administration in time range)  insulin aspart (novoLOG) injection 0-5 Units (has no administration in time range)  lisinopril (PRINIVIL,ZESTRIL) tablet 5 mg (has no administration in time range)  pantoprazole (PROTONIX) injection 40 mg (has no administration in time range)  ondansetron (ZOFRAN-ODT) disintegrating tablet 4 mg (4 mg Oral Given 10/11/18 0859)  sodium chloride 0.9 % bolus 500 mL (0 mLs Intravenous Stopped 10/11/18 0946)  sodium chloride 0.9 % bolus 500 mL (0 mLs Intravenous Stopped 10/11/18 1148)  carvedilol (COREG) tablet 12.5 mg (12.5 mg Oral Given 10/11/18 1115)  sodium chloride 0.9 % bolus 500 mL (0 mLs Intravenous Stopped 10/11/18 1441)  ondansetron (ZOFRAN) injection 4 mg (4 mg Intravenous Given 10/11/18 1535)    Mobility walks with device

## 2018-10-11 NOTE — ED Triage Notes (Signed)
She c/o few episodes of emesis x 2 days. Her husband mentions pt. Recently ceasing anticoagulant as she has an impending ?diagnostic? Procedure. She is in no distress.

## 2018-10-12 ENCOUNTER — Encounter (HOSPITAL_COMMUNITY): Payer: Self-pay

## 2018-10-12 ENCOUNTER — Other Ambulatory Visit: Payer: Self-pay

## 2018-10-12 DIAGNOSIS — I5042 Chronic combined systolic (congestive) and diastolic (congestive) heart failure: Secondary | ICD-10-CM

## 2018-10-12 DIAGNOSIS — E785 Hyperlipidemia, unspecified: Secondary | ICD-10-CM

## 2018-10-12 DIAGNOSIS — R112 Nausea with vomiting, unspecified: Secondary | ICD-10-CM

## 2018-10-12 DIAGNOSIS — I10 Essential (primary) hypertension: Secondary | ICD-10-CM | POA: Diagnosis not present

## 2018-10-12 DIAGNOSIS — F411 Generalized anxiety disorder: Secondary | ICD-10-CM

## 2018-10-12 DIAGNOSIS — R000 Tachycardia, unspecified: Secondary | ICD-10-CM | POA: Diagnosis not present

## 2018-10-12 LAB — GLUCOSE, CAPILLARY
GLUCOSE-CAPILLARY: 150 mg/dL — AB (ref 70–99)
GLUCOSE-CAPILLARY: 194 mg/dL — AB (ref 70–99)
Glucose-Capillary: 273 mg/dL — ABNORMAL HIGH (ref 70–99)
Glucose-Capillary: 214 mg/dL — ABNORMAL HIGH (ref 70–99)

## 2018-10-12 LAB — URINALYSIS, ROUTINE W REFLEX MICROSCOPIC
Bacteria, UA: NONE SEEN
Bilirubin Urine: NEGATIVE
GLUCOSE, UA: 50 mg/dL — AB
Hgb urine dipstick: NEGATIVE
KETONES UR: 5 mg/dL — AB
Nitrite: NEGATIVE
PH: 6 (ref 5.0–8.0)
PROTEIN: 30 mg/dL — AB
Specific Gravity, Urine: >1.046 — ABNORMAL HIGH (ref 1.005–1.030)

## 2018-10-12 LAB — RESPIRATORY PANEL BY PCR
ADENOVIRUS-RVPPCR: NOT DETECTED
Bordetella pertussis: NOT DETECTED
CORONAVIRUS 229E-RVPPCR: NOT DETECTED
CORONAVIRUS HKU1-RVPPCR: NOT DETECTED
CORONAVIRUS NL63-RVPPCR: NOT DETECTED
CORONAVIRUS OC43-RVPPCR: NOT DETECTED
Chlamydophila pneumoniae: NOT DETECTED
Influenza A: NOT DETECTED
Influenza B: NOT DETECTED
MYCOPLASMA PNEUMONIAE-RVPPCR: NOT DETECTED
Metapneumovirus: NOT DETECTED
PARAINFLUENZA VIRUS 1-RVPPCR: NOT DETECTED
Parainfluenza Virus 2: NOT DETECTED
Parainfluenza Virus 3: NOT DETECTED
Parainfluenza Virus 4: NOT DETECTED
Respiratory Syncytial Virus: NOT DETECTED
Rhinovirus / Enterovirus: NOT DETECTED

## 2018-10-12 LAB — CBC
HCT: 39.7 % (ref 36.0–46.0)
Hemoglobin: 12.8 g/dL (ref 12.0–15.0)
MCH: 21.8 pg — AB (ref 26.0–34.0)
MCHC: 32.2 g/dL (ref 30.0–36.0)
MCV: 67.6 fL — ABNORMAL LOW (ref 80.0–100.0)
NRBC: 0 % (ref 0.0–0.2)
PLATELETS: 275 10*3/uL (ref 150–400)
RBC: 5.87 MIL/uL — AB (ref 3.87–5.11)
RDW: 16 % — ABNORMAL HIGH (ref 11.5–15.5)
WBC: 9.7 10*3/uL (ref 4.0–10.5)

## 2018-10-12 LAB — LIPID PANEL
Cholesterol: 99 mg/dL (ref 0–200)
HDL: 36 mg/dL — ABNORMAL LOW (ref >40)
LDL CALC: 54 mg/dL (ref 0–99)
Total CHOL/HDL Ratio: 2.8 RATIO
Triglycerides: 45 mg/dL (ref <150)
VLDL: 9 mg/dL (ref 0–40)

## 2018-10-12 LAB — TSH: TSH: 2.007 u[IU]/mL (ref 0.350–4.500)

## 2018-10-12 LAB — BASIC METABOLIC PANEL
ANION GAP: 10 (ref 5–15)
BUN: 9 mg/dL (ref 8–23)
CO2: 23 mmol/L (ref 22–32)
Calcium: 8.6 mg/dL — ABNORMAL LOW (ref 8.9–10.3)
Chloride: 103 mmol/L (ref 98–111)
Creatinine, Ser: 0.66 mg/dL (ref 0.44–1.00)
Glucose, Bld: 200 mg/dL — ABNORMAL HIGH (ref 70–99)
Potassium: 3.4 mmol/L — ABNORMAL LOW (ref 3.5–5.1)
Sodium: 136 mmol/L (ref 135–145)

## 2018-10-12 LAB — TROPONIN I
Troponin I: 0.03 ng/mL (ref <0.03)
Troponin I: 0.03 ng/mL (ref <0.03)

## 2018-10-12 LAB — RAPID URINE DRUG SCREEN, HOSP PERFORMED
AMPHETAMINES: NOT DETECTED
Barbiturates: NOT DETECTED
Benzodiazepines: NOT DETECTED
COCAINE: NOT DETECTED
Opiates: NOT DETECTED
Tetrahydrocannabinol: NOT DETECTED

## 2018-10-12 MED ORDER — ASPIRIN 81 MG PO CHEW
81.0000 mg | CHEWABLE_TABLET | Freq: Every day | ORAL | Status: DC
  Administered 2018-10-12 – 2018-10-16 (×4): 81 mg via ORAL
  Filled 2018-10-12 (×4): qty 1

## 2018-10-12 MED ORDER — SODIUM CHLORIDE 0.9 % IV SOLN: INTRAVENOUS | Status: DC

## 2018-10-12 MED ORDER — DICYCLOMINE HCL 10 MG/ML IM SOLN
20.0000 mg | Freq: Once | INTRAMUSCULAR | Status: DC
  Filled 2018-10-12: qty 2

## 2018-10-12 MED ORDER — METOPROLOL TARTRATE 25 MG PO TABS
50.0000 mg | ORAL_TABLET | Freq: Two times a day (BID) | ORAL | Status: DC
  Administered 2018-10-12 – 2018-10-13 (×2): 50 mg via ORAL
  Filled 2018-10-12 (×2): qty 2

## 2018-10-12 MED ORDER — GUAIFENESIN 100 MG/5ML PO SOLN
15.0000 mL | Freq: Four times a day (QID) | ORAL | Status: DC
  Administered 2018-10-12 – 2018-10-14 (×10): 300 mg via ORAL
  Administered 2018-10-15: 100 mg via ORAL
  Administered 2018-10-15 – 2018-10-16 (×6): 300 mg via ORAL
  Filled 2018-10-12 (×4): qty 15
  Filled 2018-10-12: qty 10
  Filled 2018-10-12: qty 20
  Filled 2018-10-12: qty 15
  Filled 2018-10-12: qty 20
  Filled 2018-10-12 (×2): qty 15
  Filled 2018-10-12: qty 20
  Filled 2018-10-12 (×2): qty 15
  Filled 2018-10-12: qty 10
  Filled 2018-10-12 (×2): qty 5
  Filled 2018-10-12: qty 20
  Filled 2018-10-12: qty 10

## 2018-10-12 NOTE — Care Management CC44 (Signed)
Condition Code 44 Documentation Completed  Patient Details  Name: Kristen Lozano MRN: [id number] Date of Birth: Apr 16, 1956   Condition Code 44 given:  Yes Patient signature on Condition Code 44 notice:  Yes Documentation of 2 MD's agreement:  Yes Code 44 added to claim:  Yes    Leeroy Cha, RN 10/12/2018, 3:53 PM

## 2018-10-12 NOTE — Consult Note (Signed)
Cardiology Consultation:   Patient ID: Kristen Lozano MRN: [id number]; DOB: [birth year]  Admit date: 10/11/2018 Date of Consult: 10/12/2018  Primary Care Provider: Audley Hose, MD Primary Cardiologist: Kristen Casino, MD  Primary Electrophysiologist:  None   Patient Profile:   Kristen Lozano is a 62 y.o. female with a hx of CVA and LV thrombus on chronic Coumadin, hypertension, Type 2 DM  and chronic systolic HF with plans to undergo outpatient LHC later this week due to worsening systolic HF with further decline in EF to now 20-25% (previously 40-45%), who has now been admitted for abdominal pain, intractable n/v and lethargy. Cardiology consulted for mildly elevated troponin, at the request of [facility], Internal Medicine.   History of Present Illness:   Kristen Lozano is a 62 y.o. female with a hx of CVA and LV thrombus on chronic Coumadin, hypertension, Type 2 DM  and chronic systolic HF with plans to undergo outpatient LHC later this week due to worsening systolic HF with further decline in EF to now 20-25% (previously 40-45%), who has now been admitted for abdominal pain, intractable n/v and lethargy. Cardiology consulted for mildly elevated troponin, at the request of [facility], Internal Medicine.   Kristen Lozano is followed by [facility]. As outlined above, she had a CVA in 2015. 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 Her sister is her POA.  She also has a h/o LV thrombus in the setting of chronic systolic HF. She is on chronic anticoagulation w/ coumadin. Her EF at time of her CVA in 2015 was noted to be 35-40%. Work up included a nuclear stress test which demonstrated ant-septal and inf scar with small area of distal anterior ischemia.  However, cath was felt to be contraindicated in the setting of a recent CVA. LHC ultimately was never done. Pt declined. She however had a repeat echo in 2017 that showed  improvement with EF at 40-45%.   She recently was admitted in September for acute on chronic systolic HF as well as persistent n/v and abdominal pain. Her GI w/u was unermarkable. Negative CT of abdomen, gastric emptying study negative, EGD done on 09/23/2018 was also normal, with biopsy of stomach showing chronic mild gastritis, negative for H. Pylori.  At the time she was also seen by [facility] in consultation.  A repeat echo showed further decline in LVEF down to 20 to 25%, with diffuse hypokinesis and moderate MR. She was treated with IV diuretics with improvement in symptoms. Her metoprolol was increased and spirolactone was added. It was recommended to plan for ischemic evaluation w/ cath vs coronary CTA to exclude underlying CAD as an outpatient. Pt followed up with [facility] last week on 10/07/18 and decision was made to proceed with LHC. Pt was instructed to hold coumadin for 5 days prior to procedure, which has been scheduled for 10/14/18 w/ [facility].   Pt [facility] on 10/11/18 w/ complaints of abdominal pain, intractable n/v and lethargy.   ED Course: Patient noted to be tachycardic with heart rate in the 130s, hypertensive with diastolic in the 902I, afebrile, labs unremarkable.  ED Trop 0.03, EKG showing sinus tachycardia with some T wave inversions in the lateral leads. CMP with normal HFTs. Lipase negative. BNP elevated at 957. EDP spoke to cardiology who recommended admission here at [facility] under internal medicine service for stabilization.  IM has pt on clear liquid diet  and treating w/ anti-emetics w/ plans to consult GI if symptoms persist.   Cardiology has been consulted for elevated troponin's. Trend is low level and flat at 0.03>>0.03>>0.03. EKG on admit showed sinus tach 129 bpm with nonspecific T wave abnormalities in lateral leads.   Pt states that she is tired. Not contributing much to history. There is some cognitive deficit due to prior stroke. She looks like she  does not feel good but denies any pain. RN denies any vomiting today. She is currently on droplet precaution until respiratory panel is resulted. Tele show sinus tach. Afebrile w/ normal WBC ct.    Past Medical History:  Diagnosis Date  . Allergic rhinitis    Requires cetirizine, singulair, and fluticasone.  . Anxiety    Has been on Xanax since 2009. Uses it for stress, anxiety, and insomnia. No contract yet.  . Arthritis   . CHF (congestive heart failure) (HCC)    EF 35% after stroke, presumed ischemic  . Chronic pain    Has OA of knees B. No Xrays in echart. Not requiring narcotics.  . Colitis 12/2017  . Depression   . Diabetes mellitus    Type 2, non insulin dependent. Was dx'd prior to 2008.  [facility] Hyperglycemia   . Hyperlipemia   . Hypertension   . Sickle cell trait ([place])   . Stroke [facility]) 09/05/14   Dominant left MCA infarcts secondary to unknown embolic source     Past Surgical History:  Procedure Laterality Date  . BIOPSY  09/23/2018   Procedure: BIOPSY;  Surgeon: [facility] Flock, MD;  Location: [facility];  Service: Gastroenterology;;  . ESOPHAGOGASTRODUODENOSCOPY (EGD) WITH PROPOFOL N/A 09/23/2018   Procedure: ESOPHAGOGASTRODUODENOSCOPY (EGD) WITH PROPOFOL;  Surgeon: [facility] Flock, MD;  Location: [facility];  Service: Gastroenterology;  Laterality: N/A;  . TEE WITHOUT CARDIOVERSION N/A 09/06/2014   Procedure: TRANSESOPHAGEAL ECHOCARDIOGRAM (TEE);  Surgeon: [facility] Casino, MD;  Location: [facility];  Service: Cardiovascular;  Laterality: N/A;     Home Medications:  Prior to Admission medications   Medication Sig Start Date End Date Taking? Authorizing Provider  ASPIRIN LOW DOSE 81 MG EC tablet Take 1 tablet (81 mg total) by mouth daily. Patient taking differently: Take 81 mg by mouth daily.  01/22/18  Yes Hilty, Nadean Corwin, MD  atorvastatin (LIPITOR) 20 MG tablet Take 1 tablet (20 mg total) by mouth daily at 6 PM. Please schedule appointment for refills.  01/02/18  Yes Hilty, Nadean Corwin, MD  carvedilol (COREG) 12.5 MG tablet Take 12.5 mg by mouth 2 (two) times daily with a meal.  09/18/18  Yes [provider]  docusate sodium (COLACE) 100 MG capsule Take 1 capsule (100 mg total) by mouth 2 (two) times daily. Patient taking differently: Take 100 mg by mouth 2 (two) times daily as needed for mild constipation.  09/09/18  Yes Oretha Milch D, MD  feeding supplement, ENSURE ENLIVE, (ENSURE ENLIVE) LIQD Take 237 mLs by mouth 3 (three) times daily between meals. 08/17/18  Yes Oretha Milch D, MD  furosemide (LASIX) 20 MG tablet Take 1 tablet (20 mg total) by mouth daily. 09/02/18 09/02/19 Yes Georgette Shell, MD  insulin glargine (LANTUS) 100 UNIT/ML injection Inject 0.1 mLs (10 Units total) into the skin at bedtime. 09/25/18  Yes Oswald Hillock, MD  lisinopril (PRINIVIL,ZESTRIL) 2.5 MG tablet Take 2.5 mg by mouth daily.  09/18/18  Yes [provider]  metFORMIN (GLUCOPHAGE-XR) 500 MG 24 hr tablet Take 1,000 mg by  mouth 2 (two) times daily.  08/06/18  Yes [provider]  Multiple Vitamins-Minerals (MULTIVITAMIN ADULT) TABS Take 1 tablet by mouth daily.   Yes [provider]  ondansetron (ZOFRAN) 4 MG tablet Take 1 tablet (4 mg total) by mouth every 12 (twelve) hours. 09/25/18  Yes Oswald Hillock, MD  potassium chloride (K-DUR) 10 MEQ tablet Take 1 tablet (10 mEq total) by mouth daily. 09/02/18  Yes Georgette Shell, MD  warfarin (COUMADIN) 5 MG tablet Take 1 to 1.5 tablets by mouth daily as directed by coumadin clinic 10/06/18  Yes Hilty, Nadean Corwin, MD  polyethylene glycol (MIRALAX / GLYCOLAX) packet Take 17 g by mouth daily as needed. Patient not taking: Reported on 10/11/2018 09/09/18   Desiree Hane, MD    Inpatient Medications: Scheduled Meds: . aspirin EC  81 mg Oral Daily  . atorvastatin  20 mg Oral q1800  . carvedilol  12.5 mg Oral BID WC  . dicyclomine  20 mg Intramuscular Once  . enoxaparin (LOVENOX)  injection  40 mg Subcutaneous Q24H  . feeding supplement (ENSURE ENLIVE)  237 mL Oral TID BM  . guaiFENesin  600 mg Oral BID  . insulin aspart  0-5 Units Subcutaneous QHS  . insulin aspart  0-9 Units Subcutaneous TID WC  . insulin glargine  10 Units Subcutaneous QHS  . iopamidol      . lisinopril  5 mg Oral Daily  . pantoprazole (PROTONIX) IV  40 mg Intravenous Q24H  . sodium chloride (PF)      . sodium chloride flush  3 mL Intravenous Q12H   Continuous Infusions:  PRN Meds: acetaminophen **OR** acetaminophen, albuterol, docusate sodium, hydrALAZINE, metoprolol tartrate, ondansetron **OR** ondansetron (ZOFRAN) IV, promethazine, senna-docusate, traMADol  Allergies:   No Known Allergies  Social History:   Social History   Socioeconomic History  . Marital status: Legally Separated    Spouse name: Not on file  . Number of children: 2  . Years of education: 9 TH  . Highest education level: Not on file  Occupational History  . Occupation: Disabled  Social Needs  . Financial resource strain: Not on file  . Food insecurity:    Worry: Not on file    Inability: Not on file  . Transportation needs:    Medical: Not on file    Non-medical: Not on file  Tobacco Use  . Smoking status: Never Smoker  . Smokeless tobacco: Never Used  Substance and Sexual Activity  . Alcohol use: No    Alcohol/week: 0.0 standard drinks  . Drug use: No  . Sexual activity: Not on file  Lifestyle  . Physical activity:    Days per week: Not on file    Minutes per session: Not on file  . Stress: Not on file  Relationships  . Social connections:    Talks on phone: Not on file    Gets together: Not on file    Attends religious service: Not on file    Active member of club or organization: Not on file    Attends meetings of clubs or organizations: Not on file    Relationship status: Not on file  . Intimate partner violence:    Fear of current or ex partner: Not on file    Emotionally abused: Not on  file    Physically abused: Not on file    Forced sexual activity: Not on file  Other Topics Concern  . Not on file  Social History Narrative  Patient is married with 2 children.   Patient is right handed.   Patient has 9 th grade education.   Patient drinks 2 cups daily.    Family History:    Family History  Problem Relation Age of Onset  . Diabetes Mother   . Hypertension Mother   . Heart disease Father   . Heart attack Father   . Hypertension Father   . Diabetes Father   . Ovarian cancer Sister   . Liver cancer Sister   . Sickle cell anemia Daughter   . Schizophrenia Daughter   . Hypertension Sister   . Hypertension Brother   . Hypertension Daughter   . Kidney disease Sister        x2  . Kidney disease Brother   . Stroke Neg Hx   . Esophageal cancer Neg Hx   . Colon cancer Neg Hx   . Colon polyps Neg Hx      ROS:  Please see the history of present illness.   All other ROS reviewed and negative.     Physical Exam/Data:   Vitals:   10/12/18 0700 10/12/18 0800 10/12/18 0806 10/12/18 0900  BP: (!) 161/94 (!) 157/109 (!) 153/102 108/72  Pulse: (!) 122   (!) 117  Resp: 19   (!) 21  Temp:      TempSrc:      SpO2: 96%   96%  Weight:      Height:        Intake/Output Summary (Last 24 hours) at 10/12/2018 1001 Last data filed at 10/12/2018 0800 Gross per 24 hour  Intake 1000 ml  Output 280 ml  Net 720 ml   Filed Weights   10/11/18 0843  Weight: 51.7 kg   Body mass index is 20.19 kg/m.  General:  Middle aged BF, thin appearing in acute distress, dentition in poor repair HEENT: normal Lymph: no adenopathy Neck: no JVD Endocrine:  No thryomegaly Vascular: No carotid bruits; FA pulses 2+ bilaterally without bruits  Cardiac: regular rhythm, tachy rate  Lungs:  clear to auscultation bilaterally, no wheezing, rhonchi or rales  Abd: soft, nontender, no hepatomegaly  Ext: no edema Musculoskeletal:  Thin extremities No deformities, BUE and BLE strength  normal and equal Skin: warm and dry  Neuro:  cognitive deficit due to prior stroke  Psych:  Normal affect   EKG:  The EKG was personally reviewed and demonstrates:  Sinus tach 129 bpm, nonspecific T wave abnormalities in lateral leads  Telemetry:  Telemetry was personally reviewed and demonstrates:  Sinus tach 120s. Few PVCs.   Relevant CV Studies: 2D Echo 08/10/18  Study Conclusions  - Left ventricle: The cavity size was normal. Wall thickness was   normal. Systolic function was severely reduced. The estimated   ejection fraction was in the range of 20% to 25%. Diffuse   hypokinesis. The study is not technically sufficient to allow   evaluation of LV diastolic function. - Ventricular septum: Septal motion showed abnormal function and   dyssynergy. - Aortic valve: There was trivial regurgitation. - Mitral valve: There was moderate regurgitation. - Left atrium: The atrium was mildly dilated. - Tricuspid valve: There was trivial regurgitation. - Pulmonary arteries: Systolic pressure was moderately increased.   PA peak pressure: 53 mm Hg (S). - Pericardium, extracardiac: A trivial pericardial effusion was   identified. There was a left pleural effusion.  Impressions:  - EF is reduced when compared to prior study (40%)  Laboratory Data:  Chemistry Recent  Labs  Lab 10/11/18 1300 10/12/18 0328  NA 137 136  K 3.9 3.4*  CL 104 103  CO2 24 23  GLUCOSE 232* 200*  BUN 12 9  CREATININE 0.69 0.66  CALCIUM 8.5* 8.6*  GFRNONAA >60 >60  GFRAA >60 >60  ANIONGAP 9 10    Recent Labs  Lab 10/11/18 1300  PROT 6.3*  ALBUMIN 3.3*  AST 24  ALT 33  ALKPHOS 38  BILITOT 1.5*   Hematology Recent Labs  Lab 10/11/18 1300 10/12/18 0328  WBC 9.5 9.7  RBC 6.17* 5.87*  HGB 13.5 12.8  HCT 42.3 39.7  MCV 68.6* 67.6*  MCH 21.9* 21.8*  MCHC 31.9 32.2  RDW 16.9* 16.0*  PLT 273 275   Cardiac Enzymes Recent Labs  Lab 10/11/18 2051 10/12/18 0328 10/12/18 0848  TROPONINI  0.03* 0.03* 0.03*    Recent Labs  Lab 10/11/18 0927  TROPIPOC 0.03    BNP Recent Labs  Lab 10/11/18 2051  BNP 957.6*    DDimer No results for input(s): DDIMER in the last 168 hours.  Radiology/Studies:  Ct Head Wo Contrast  Result Date: 10/11/2018 CLINICAL DATA:  Altered mental status EXAM: CT HEAD WITHOUT CONTRAST TECHNIQUE: Contiguous axial images were obtained from the base of the skull through the vertex without intravenous contrast. COMPARISON:  Head CT 09/04/2014 FINDINGS: Brain: There is no mass, hemorrhage or extra-axial collection. There is generalized atrophy without lobar predilection. There are nonacute infarcts of both parietal lobes, likely chronic, but new since 04/01/2014. There is an old left basal ganglia infarct. There is periventricular hypoattenuation compatible with chronic microvascular disease. Vascular: No abnormal hyperdensity of the major intracranial arteries or dural venous sinuses. No intracranial atherosclerosis. Skull: The visualized skull base, calvarium and extracranial soft tissues are normal. Sinuses/Orbits: No fluid levels or advanced mucosal thickening of the visualized paranasal sinuses. No mastoid or middle ear effusion. The orbits are normal. IMPRESSION: 1. No acute intracranial abnormality. 2. Multiple old infarcts. 3. Atrophy and chronic ischemic microangiopathy. Electronically Signed   By: Ulyses Jarred M.D.   On: 10/11/2018 23:55   Ct Angio Chest Pe W Or Wo Contrast  Result Date: 10/12/2018 CLINICAL DATA:  Nausea and vomiting.  Cough and runny nose. EXAM: CT ANGIOGRAPHY CHEST CT ABDOMEN AND PELVIS WITH CONTRAST TECHNIQUE: Multidetector CT imaging of the chest was performed using the standard protocol during bolus administration of intravenous contrast. Multiplanar CT image reconstructions and MIPs were obtained to evaluate the vascular anatomy. Multidetector CT imaging of the abdomen and pelvis was performed using the standard protocol during bolus  administration of intravenous contrast. CONTRAST:  154mL ISOVUE-370 IOPAMIDOL (ISOVUE-370) INJECTION 76% COMPARISON:  CT of the chest October 28, 2016 and CT the abdomen and pelvis September 01, 2018 FINDINGS: CTA CHEST FINDINGS Cardiovascular: There is mild atherosclerotic change in the thoracic aortic arch. No aneurysm or obvious dissection. Cardiomegaly. No pulmonary emboli. Mediastinum/Nodes: Moderate bilateral pleural effusions. No adenopathy or other abnormality. Lungs/Pleura: Central airways are normal. No pneumothorax. Moderate bilateral pleural effusions with associated atelectasis. No suspicious nodules or masses. No other infiltrates. Musculoskeletal: See below Review of the MIP images confirms the above findings. CT ABDOMEN and PELVIS FINDINGS Hepatobiliary: Heterogeneous attenuation liver consistent with a not mega liver suggesting elevated right heart pressures. No suspicious mass. Limited views of the gallbladder are unremarkable. The main pulmonary vein is well opacified. Pancreas: Unremarkable. No pancreatic ductal dilatation or surrounding inflammatory changes. Spleen: Normal in size without focal abnormality. Adrenals/Urinary Tract: Evaluation limited due to  motion. Regions of cortical thinning in the kidneys, likely from previous infarcts are infections. No obvious mass, hydronephrosis, or perinephric stranding. No ureteral stones noted. The bladder is unremarkable. Stomach/Bowel: The stomach and small bowel are unremarkable. The colon is unremarkable. The appendix is not well assessed but there is no secondary evidence of appendicitis. Vascular/Lymphatic: No aneurysmal dilatation or dissection noted. No adenopathy. Reproductive: Uterus and bilateral adnexa are unremarkable. Other: Increased attenuation diffusely throughout the subcutaneous fat, likely from volume overload. Musculoskeletal: No acute or significant osseous findings. Review of the MIP images confirms the above findings. IMPRESSION: 1.  No pulmonary emboli. 2. Moderate bilateral pleural effusions with underlying atelectasis. 3. Atherosclerotic change in the aorta. 4. Heterogeneous nutmeg appearance to the liver, likely due to vascular congestion from elevated right heart pressures. 5. Evaluation of the remainder of the abdomen is limited due to motion. No other acute abnormalities. Electronically Signed   By: Dorise Bullion III M.D   On: 10/12/2018 00:16   Ct Abdomen Pelvis W Contrast  Result Date: 10/12/2018 CLINICAL DATA:  Nausea and vomiting.  Cough and runny nose. EXAM: CT ANGIOGRAPHY CHEST CT ABDOMEN AND PELVIS WITH CONTRAST TECHNIQUE: Multidetector CT imaging of the chest was performed using the standard protocol during bolus administration of intravenous contrast. Multiplanar CT image reconstructions and MIPs were obtained to evaluate the vascular anatomy. Multidetector CT imaging of the abdomen and pelvis was performed using the standard protocol during bolus administration of intravenous contrast. CONTRAST:  147mL ISOVUE-370 IOPAMIDOL (ISOVUE-370) INJECTION 76% COMPARISON:  CT of the chest October 28, 2016 and CT the abdomen and pelvis September 01, 2018 FINDINGS: CTA CHEST FINDINGS Cardiovascular: There is mild atherosclerotic change in the thoracic aortic arch. No aneurysm or obvious dissection. Cardiomegaly. No pulmonary emboli. Mediastinum/Nodes: Moderate bilateral pleural effusions. No adenopathy or other abnormality. Lungs/Pleura: Central airways are normal. No pneumothorax. Moderate bilateral pleural effusions with associated atelectasis. No suspicious nodules or masses. No other infiltrates. Musculoskeletal: See below Review of the MIP images confirms the above findings. CT ABDOMEN and PELVIS FINDINGS Hepatobiliary: Heterogeneous attenuation liver consistent with a not mega liver suggesting elevated right heart pressures. No suspicious mass. Limited views of the gallbladder are unremarkable. The main pulmonary vein is well  opacified. Pancreas: Unremarkable. No pancreatic ductal dilatation or surrounding inflammatory changes. Spleen: Normal in size without focal abnormality. Adrenals/Urinary Tract: Evaluation limited due to motion. Regions of cortical thinning in the kidneys, likely from previous infarcts are infections. No obvious mass, hydronephrosis, or perinephric stranding. No ureteral stones noted. The bladder is unremarkable. Stomach/Bowel: The stomach and small bowel are unremarkable. The colon is unremarkable. The appendix is not well assessed but there is no secondary evidence of appendicitis. Vascular/Lymphatic: No aneurysmal dilatation or dissection noted. No adenopathy. Reproductive: Uterus and bilateral adnexa are unremarkable. Other: Increased attenuation diffusely throughout the subcutaneous fat, likely from volume overload. Musculoskeletal: No acute or significant osseous findings. Review of the MIP images confirms the above findings. IMPRESSION: 1. No pulmonary emboli. 2. Moderate bilateral pleural effusions with underlying atelectasis. 3. Atherosclerotic change in the aorta. 4. Heterogeneous nutmeg appearance to the liver, likely due to vascular congestion from elevated right heart pressures. 5. Evaluation of the remainder of the abdomen is limited due to motion. No other acute abnormalities. Electronically Signed   By: Dorise Bullion III M.D   On: 10/12/2018 00:16    Assessment and Plan:   Ms. Prak is a 62 y.o. female with a hx of CVA and LV thrombus on chronic Coumadin,  hypertension, Type 2 DM  and chronic systolic HF with plans to undergo outpatient LHC later this week due to worsening systolic HF with further decline in EF to now 20-25% (previously 40-45%), who has now been admitted for abdominal pain, intractable n/v and lethargy. Cardiology consulted for mildly elevated troponin, at the request of [facility].   1. Abnormal Troponin: flat, low level trend at 0.03>>0.03>>0.03. Not  c/w ACS. Suspect demand ischemia in the setting of acute GI illness, tachycardia and chronic systolic HF. She denies CP. Although troponin trend is not concerning for ACS, it has been recommended that she undergo ischemic evaluation given further reduction in LVEF, now at 20-25% as well as her other CRFs including T2DM and HTN. LHC is tentatively scheduled for 10/14/18. If she is stable from a medical standpoint w/ improvement of her acute GI illness, then we can proceed with plans to cath this week. If not stable, we can reschedule.   2. Chronic Systolic HF: Systolic HF dates back to 2015 when she had her CVA. EF at that time was 35-40% and improved on repeat echo in 2017, up to 40-45%. EF now 20-25% on recent echo 07/2018 w/ plans to undergo LHC to r/o CAD. BNP is abnormal at 957 but she does not appear to be grossly volume overloaded. She is tachycardic and appears a bit dry. No diuretics ordered at this time. Would recommend RHC as well. RHC Measurements will help to determine volume status and help guide diuresis. Continue medical therapy for systolic HF. She is currently on  blocker therapy w/ Coreg. May consider changing to metoprolol succinate for better HR control. She is currently on ACEi therapy w/ lisinopril. Recommend considering transition to [facility], however this would require a 36 hr ACEi washout, prior to initiation. Given her HF and persistent tachycardia and n/v, I also recommend checking TSH. Will order. ? Tachy mediated CM, as she was also noted to be tachycardic during previous admit in Sep.   3. H/o CVA: cognitive deficit due to prior stroke. Her sister is POA.  4. H/o LV Thrombus: first noted on echo in 2017. She is on chronic coumadin however currently on hold due to plans for cardiac craterization this week.   5. GI: similar presentation in September w/ unremarkable w/u. Negative CT of abdomen, gastric emptying study negative, EGD done on 09/23/2018 was also normal, with biopsy of  stomach showing chronic mild gastritis, negative for H. Pylori. This admit, HFTs normal and lipase negative. Clear liquid + anti emetics per IM.   6. HTN: controlled this morning. 108/72. Continue to monitor.   7. DM: management per IM.    For questions or updates, please contact [place] Please consult [email or web address] for contact info under     Signed, Kristen Jester, PA-C  10/12/2018 10:01 AM

## 2018-10-12 NOTE — Progress Notes (Signed)
PROGRESS NOTE  Kristen Lozano KCL:[id number] DOB: [birth year] DOA: 10/11/2018 PCP: Audley Hose, MD  HPI/Recap of past 24 hours: Kristen Lozano is a 62 y.o. female with medical history significant for hypertension, diabetes mellitus type 2, history of CVA, LV thrombus, CHF, depression, anxiety, hyperlipidemia, presents to the ED complaining of intractable nausea/vomiting, with generalized abdominal pain that has been ongoing for the past 2 days.  Patient describes vomitus as clear, denies seeing any blood.  Reports generalized abdominal pain, worse around the epigastric region.  Patient also reports some cough and runny nose.  Bouts of coughing usually elicits vomiting. Reports some frontal headache. Patient does report compliance to her medications.  Husband at bedside, assisted with the history. Denies any illicit drug use, alcohol use.  Of note, due to significant EF dropped to 20 to 25%, cardiology planned cardiac cath to be done on 10/14/2018, was instructed to hold Coumadin for now.  Due to above symptoms patient presented to the ER.  Patient was recently discharged on 09/25/18 with similar complaints. In the ED, patient noted to be tachycardic with heart rate in the 130s, hypertensive with diastolic in the 494W, afebrile, labs unremarkable.  ED Trop negative, EKG showing sinus tachycardia with some T wave inversions in the lateral leads.  EDP spoke to cardiology who recommended admission here at [facility] for stabilization. Pt admitted for further management.   Today, pt reported feeling much better. Eager to have regular food. Denies any further nausea/vomiting or abdominal pain, denies any chest pain, worsening SOB, fever/chills. Noted to be persistently tachycardic.   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)   Chronic  combined systolic and diastolic CHF (congestive heart failure) ([place])   Uncontrolled type 2 diabetes mellitus with hyperglycemia (HCC)   Sinus tachycardia   HLD (hyperlipidemia)   GERD (gastroesophageal reflux disease)  Intractable nausea/vomiting, lethargic Reports associated generalized abdominal pain, headaches Unknown etiology, ??Uncontrolled hypertension, although reports compliance to medication LFTs, except T bili which is at baseline, lipase, BMP, CBC all within normal limits ACS rule out: ED Trop negative, EKG with sinus tachy, T wave inversion in lateral leads CT abdomen/pelvis unremarkable CT head unremarkable Had multiple work-ups for N/V: Negative CT on previous admission, gastric emptying study negative, EGD done on 09/23/2018 was also normal, with biopsy of stomach showing chronic mild gastritis, negative for H. pylori Status post 1 L of fluids in the ED Anti-emetics, pain management, BP control Advance diet  Uncontrolled hypertension On presentation DBP in the 120s Reports compliance to meds Increased home lisinopril to 5 mg, held Coreg 12.5 twice daily, switch to PO lopressor for HR control IV hydralazine PRN  Sinus tachycardia Likely due to ??intractable nausea/vomiting Vs dehydration Vs tachy mediated CM EKG with sinus tachycardia, T wave inversion in lateral leads TSH WNL CTA chest negative for any PE, no evidence of PNA Hold home coreg for now and switch to PO lopressor  Mildly elevated troponin Likely 2/2 demand ischemia Cardiology on board  Chronic systolic and diastolic HF/pulmonary hypertension/LV thrombus Appears somewhat dry s/p 1L of IVF Echo done on 07/2018 showed EF of 20 to 25%, with a PA peak pressure of 53 mmHg, significantly reduced from last echo in 2017 Cardiology planned for cardiac cath on 10/14/2018, was instructed to hold home warfarin Continue meds as above, continue to hold warfarin,  hold Lasix Cardiology on board: Plan for [place] if  stable during this admission Strict I's and O's, daily weights  Diabetes mellitus type 2 Last A1c on 10/19 was 7.4 SSI, Accu-Cheks, Lantus 10 at bedtime Hold home metformin  History of CVA Continue statins  Hyperlipidemia LDL 54 Continue statins  GERD Start IV PPI     Code Status: Full  Family Communication: None at bedside  Disposition Plan: Possible discharge on 10/13/2018, unless cardiology wants to do [facility] tomorrow or next   Consultants:  Cardiology  Procedures:  None  Antimicrobials:  None  DVT prophylaxis: Lovenox   Objective: Vitals:   10/12/18 1400 10/12/18 1515 10/12/18 1600 10/12/18 1726  BP: 95/80 107/68 111/64 118/82  Pulse: (!) 125 (!) 117 (!) 119 (!) 120  Resp: (!) 22 (!) 21 (!) 22   Temp:   98.8 F (37.1 C)   TempSrc:   Oral   SpO2: 98% 99% 94%   Weight:      Height:        Intake/Output Summary (Last 24 hours) at 10/12/2018 1817 Last data filed at 10/12/2018 0800 Gross per 24 hour  Intake -  Output 280 ml  Net -280 ml   Filed Weights   10/11/18 0843  Weight: 51.7 kg    Exam:   General: NAD, chronically ill-appearing  Cardiovascular: S1, S2 present, tachycardic  Respiratory: CTA B  Abdomen: Soft, nontender, nondistended, bowel sounds present  Musculoskeletal: No pedal edema bilaterally  Skin: Normal  Psychiatry: Normal mood   Data Reviewed: CBC: Recent Labs  Lab 10/11/18 1300 10/12/18 0328  WBC 9.5 9.7  NEUTROABS 6.7  --   HGB 13.5 12.8  HCT 42.3 39.7  MCV 68.6* 67.6*  PLT 273 329   Basic Metabolic Panel: Recent Labs  Lab 10/11/18 1300 10/12/18 0328  NA 137 136  K 3.9 3.4*  CL 104 103  CO2 24 23  GLUCOSE 232* 200*  BUN 12 9  CREATININE 0.69 0.66  CALCIUM 8.5* 8.6*   GFR: Estimated Creatinine Clearance: 58.7 mL/min (by C-G formula based on SCr of 0.66 mg/dL). Liver Function Tests: Recent Labs  Lab 10/11/18 1300  AST 24  ALT 33  ALKPHOS 38  BILITOT 1.5*  PROT 6.3*  ALBUMIN  3.3*   Recent Labs  Lab 10/11/18 1300  LIPASE 23   No results for input(s): AMMONIA in the last 168 hours. Coagulation Profile: Recent Labs  Lab 10/11/18 0850  INR 1.26   Cardiac Enzymes: Recent Labs  Lab 10/11/18 2051 10/12/18 0328 10/12/18 0848  TROPONINI 0.03* 0.03* 0.03*   BNP (last 3 results) No results for input(s): PROBNP in the last 8760 hours. HbA1C: No results for input(s): HGBA1C in the last 72 hours. CBG: Recent Labs  Lab 10/11/18 2115 10/12/18 0742 10/12/18 1229 10/12/18 1709  GLUCAP 240* 150* 273* 214*   Lipid Profile: Recent Labs    10/12/18 0328  CHOL 99  HDL 36*  LDLCALC 54  TRIG 45  CHOLHDL 2.8   Thyroid Function Tests: Recent Labs    10/12/18 1118  TSH 2.007   Anemia Panel: No results for input(s): VITAMINB12, FOLATE, FERRITIN, TIBC, IRON, RETICCTPCT in the last 72 hours. Urine analysis:    Component Value Date/Time   COLORURINE YELLOW 10/12/2018 [place] 10/12/2018 0755   LABSPEC >1.046 (H) 10/12/2018 0755   PHURINE 6.0 10/12/2018 0755   GLUCOSEU 50 (A) 10/12/2018 0755   HGBUR NEGATIVE 10/12/2018 [place] NEGATIVE 10/12/2018 0755  KETONESUR 5 (A) 10/12/2018 0755   PROTEINUR 30 (A) 10/12/2018 0755   UROBILINOGEN 1.0 09/07/2014 1405   NITRITE NEGATIVE 10/12/2018 0755   LEUKOCYTESUR TRACE (A) 10/12/2018 0755   Sepsis Labs: @LABRCNTIP (procalcitonin:4,lacticidven:4)  ) Recent Results (from the past 240 hour(s))  MRSA PCR Screening     Status: None   Collection Time: 10/11/18  8:00 PM  Result Value Ref Range Status   MRSA by PCR NEGATIVE NEGATIVE Final    Comment:        The GeneXpert MRSA Assay (FDA approved for NASAL specimens only), is one component of a comprehensive MRSA colonization surveillance program. It is not intended to diagnose MRSA infection nor to guide or monitor treatment for MRSA infections. Performed at [facility], [place] [address] [place],  [place] [address]   Respiratory Panel by PCR     Status: None   Collection Time: 10/12/18  7:55 AM  Result Value Ref Range Status   Adenovirus NOT DETECTED NOT DETECTED Final   Coronavirus 229E NOT DETECTED NOT DETECTED Final   Coronavirus HKU1 NOT DETECTED NOT DETECTED Final   Coronavirus NL63 NOT DETECTED NOT DETECTED Final   Coronavirus OC43 NOT DETECTED NOT DETECTED Final   Metapneumovirus NOT DETECTED NOT DETECTED Final   Rhinovirus / Enterovirus NOT DETECTED NOT DETECTED Final   Influenza A NOT DETECTED NOT DETECTED Final   Influenza B NOT DETECTED NOT DETECTED Final   Parainfluenza Virus 1 NOT DETECTED NOT DETECTED Final   Parainfluenza Virus 2 NOT DETECTED NOT DETECTED Final   Parainfluenza Virus 3 NOT DETECTED NOT DETECTED Final   Parainfluenza Virus 4 NOT DETECTED NOT DETECTED Final   Respiratory Syncytial Virus NOT DETECTED NOT DETECTED Final   Bordetella pertussis NOT DETECTED NOT DETECTED Final   Chlamydophila pneumoniae NOT DETECTED NOT DETECTED Final   Mycoplasma pneumoniae NOT DETECTED NOT DETECTED Final    Comment: Performed at [facility], [place] [address] [place], [place] [address]      Studies: Ct Head Wo Contrast  Result Date: 10/11/2018 CLINICAL DATA:  Altered mental status EXAM: CT HEAD WITHOUT CONTRAST TECHNIQUE: Contiguous axial images were obtained from the base of the skull through the vertex without intravenous contrast. COMPARISON:  Head CT 09/04/2014 FINDINGS: Brain: There is no mass, hemorrhage or extra-axial collection. There is generalized atrophy without lobar predilection. There are nonacute infarcts of both parietal lobes, likely chronic, but new since 04/01/2014. There is an old left basal ganglia infarct. There is periventricular hypoattenuation compatible with chronic microvascular disease. Vascular: No abnormal hyperdensity of the major intracranial arteries or dural venous sinuses. No intracranial atherosclerosis. Skull: The visualized skull base,  calvarium and extracranial soft tissues are normal. Sinuses/Orbits: No fluid levels or advanced mucosal thickening of the visualized paranasal sinuses. No mastoid or middle ear effusion. The orbits are normal. IMPRESSION: 1. No acute intracranial abnormality. 2. Multiple old infarcts. 3. Atrophy and chronic ischemic microangiopathy. Electronically Signed   By: Ulyses Jarred M.D.   On: 10/11/2018 23:55   Ct Angio Chest Pe W Or Wo Contrast  Result Date: 10/12/2018 CLINICAL DATA:  Nausea and vomiting.  Cough and runny nose. EXAM: CT ANGIOGRAPHY CHEST CT ABDOMEN AND PELVIS WITH CONTRAST TECHNIQUE: Multidetector CT imaging of the chest was performed using the standard protocol during bolus administration of intravenous contrast. Multiplanar CT image reconstructions and MIPs were obtained to evaluate the vascular anatomy. Multidetector CT imaging of the abdomen and pelvis was performed using the standard protocol during bolus  administration of intravenous contrast. CONTRAST:  168m ISOVUE-370 IOPAMIDOL (ISOVUE-370) INJECTION 76% COMPARISON:  CT of the chest October 28, 2016 and CT the abdomen and pelvis September 01, 2018 FINDINGS: CTA CHEST FINDINGS Cardiovascular: There is mild atherosclerotic change in the thoracic aortic arch. No aneurysm or obvious dissection. Cardiomegaly. No pulmonary emboli. Mediastinum/Nodes: Moderate bilateral pleural effusions. No adenopathy or other abnormality. Lungs/Pleura: Central airways are normal. No pneumothorax. Moderate bilateral pleural effusions with associated atelectasis. No suspicious nodules or masses. No other infiltrates. Musculoskeletal: See below Review of the MIP images confirms the above findings. CT ABDOMEN and PELVIS FINDINGS Hepatobiliary: Heterogeneous attenuation liver consistent with a not mega liver suggesting elevated right heart pressures. No suspicious mass. Limited views of the gallbladder are unremarkable. The main pulmonary vein is well opacified. Pancreas:  Unremarkable. No pancreatic ductal dilatation or surrounding inflammatory changes. Spleen: Normal in size without focal abnormality. Adrenals/Urinary Tract: Evaluation limited due to motion. Regions of cortical thinning in the kidneys, likely from previous infarcts are infections. No obvious mass, hydronephrosis, or perinephric stranding. No ureteral stones noted. The bladder is unremarkable. Stomach/Bowel: The stomach and small bowel are unremarkable. The colon is unremarkable. The appendix is not well assessed but there is no secondary evidence of appendicitis. Vascular/Lymphatic: No aneurysmal dilatation or dissection noted. No adenopathy. Reproductive: Uterus and bilateral adnexa are unremarkable. Other: Increased attenuation diffusely throughout the subcutaneous fat, likely from volume overload. Musculoskeletal: No acute or significant osseous findings. Review of the MIP images confirms the above findings. IMPRESSION: 1. No pulmonary emboli. 2. Moderate bilateral pleural effusions with underlying atelectasis. 3. Atherosclerotic change in the aorta. 4. Heterogeneous nutmeg appearance to the liver, likely due to vascular congestion from elevated right heart pressures. 5. Evaluation of the remainder of the abdomen is limited due to motion. No other acute abnormalities. Electronically Signed   By: DDorise BullionIII M.D   On: 10/12/2018 00:16   Ct Abdomen Pelvis W Contrast  Result Date: 10/12/2018 CLINICAL DATA:  Nausea and vomiting.  Cough and runny nose. EXAM: CT ANGIOGRAPHY CHEST CT ABDOMEN AND PELVIS WITH CONTRAST TECHNIQUE: Multidetector CT imaging of the chest was performed using the standard protocol during bolus administration of intravenous contrast. Multiplanar CT image reconstructions and MIPs were obtained to evaluate the vascular anatomy. Multidetector CT imaging of the abdomen and pelvis was performed using the standard protocol during bolus administration of intravenous contrast. CONTRAST:   101mISOVUE-370 IOPAMIDOL (ISOVUE-370) INJECTION 76% COMPARISON:  CT of the chest October 28, 2016 and CT the abdomen and pelvis September 01, 2018 FINDINGS: CTA CHEST FINDINGS Cardiovascular: There is mild atherosclerotic change in the thoracic aortic arch. No aneurysm or obvious dissection. Cardiomegaly. No pulmonary emboli. Mediastinum/Nodes: Moderate bilateral pleural effusions. No adenopathy or other abnormality. Lungs/Pleura: Central airways are normal. No pneumothorax. Moderate bilateral pleural effusions with associated atelectasis. No suspicious nodules or masses. No other infiltrates. Musculoskeletal: See below Review of the MIP images confirms the above findings. CT ABDOMEN and PELVIS FINDINGS Hepatobiliary: Heterogeneous attenuation liver consistent with a not mega liver suggesting elevated right heart pressures. No suspicious mass. Limited views of the gallbladder are unremarkable. The main pulmonary vein is well opacified. Pancreas: Unremarkable. No pancreatic ductal dilatation or surrounding inflammatory changes. Spleen: Normal in size without focal abnormality. Adrenals/Urinary Tract: Evaluation limited due to motion. Regions of cortical thinning in the kidneys, likely from previous infarcts are infections. No obvious mass, hydronephrosis, or perinephric stranding. No ureteral stones noted. The bladder is unremarkable. Stomach/Bowel: The stomach and small bowel  are unremarkable. The colon is unremarkable. The appendix is not well assessed but there is no secondary evidence of appendicitis. Vascular/Lymphatic: No aneurysmal dilatation or dissection noted. No adenopathy. Reproductive: Uterus and bilateral adnexa are unremarkable. Other: Increased attenuation diffusely throughout the subcutaneous fat, likely from volume overload. Musculoskeletal: No acute or significant osseous findings. Review of the MIP images confirms the above findings. IMPRESSION: 1. No pulmonary emboli. 2. Moderate bilateral pleural  effusions with underlying atelectasis. 3. Atherosclerotic change in the aorta. 4. Heterogeneous nutmeg appearance to the liver, likely due to vascular congestion from elevated right heart pressures. 5. Evaluation of the remainder of the abdomen is limited due to motion. No other acute abnormalities. Electronically Signed   By: Dorise Bullion III M.D   On: 10/12/2018 00:16    Scheduled Meds: . aspirin  81 mg Oral Daily  . atorvastatin  20 mg Oral q1800  . carvedilol  12.5 mg Oral BID WC  . dicyclomine  20 mg Intramuscular Once  . enoxaparin (LOVENOX) injection  40 mg Subcutaneous Q24H  . feeding supplement (ENSURE ENLIVE)  237 mL Oral TID BM  . guaiFENesin  15 mL Oral QID  . insulin aspart  0-5 Units Subcutaneous QHS  . insulin aspart  0-9 Units Subcutaneous TID WC  . insulin glargine  10 Units Subcutaneous QHS  . lisinopril  5 mg Oral Daily  . pantoprazole (PROTONIX) IV  40 mg Intravenous Q24H  . sodium chloride flush  3 mL Intravenous Q12H    Continuous Infusions:   LOS: 1 day     Alma Friendly, MD [facility]  If 7PM-7AM, please contact night-coverage [email or web address] 10/12/2018, 6:17 PM

## 2018-10-13 DIAGNOSIS — D573 Sickle-cell trait: Secondary | ICD-10-CM | POA: Diagnosis present

## 2018-10-13 DIAGNOSIS — I5023 Acute on chronic systolic (congestive) heart failure: Secondary | ICD-10-CM | POA: Diagnosis not present

## 2018-10-13 DIAGNOSIS — F411 Generalized anxiety disorder: Secondary | ICD-10-CM | POA: Diagnosis present

## 2018-10-13 DIAGNOSIS — I272 Pulmonary hypertension, unspecified: Secondary | ICD-10-CM | POA: Diagnosis present

## 2018-10-13 DIAGNOSIS — I248 Other forms of acute ischemic heart disease: Secondary | ICD-10-CM | POA: Diagnosis present

## 2018-10-13 DIAGNOSIS — I5043 Acute on chronic combined systolic (congestive) and diastolic (congestive) heart failure: Secondary | ICD-10-CM | POA: Diagnosis present

## 2018-10-13 DIAGNOSIS — Z79899 Other long term (current) drug therapy: Secondary | ICD-10-CM | POA: Diagnosis not present

## 2018-10-13 DIAGNOSIS — I252 Old myocardial infarction: Secondary | ICD-10-CM | POA: Diagnosis not present

## 2018-10-13 DIAGNOSIS — I428 Other cardiomyopathies: Secondary | ICD-10-CM | POA: Diagnosis present

## 2018-10-13 DIAGNOSIS — Z7901 Long term (current) use of anticoagulants: Secondary | ICD-10-CM | POA: Diagnosis not present

## 2018-10-13 DIAGNOSIS — Z823 Family history of stroke: Secondary | ICD-10-CM | POA: Diagnosis not present

## 2018-10-13 DIAGNOSIS — K920 Hematemesis: Secondary | ICD-10-CM | POA: Diagnosis present

## 2018-10-13 DIAGNOSIS — I5020 Unspecified systolic (congestive) heart failure: Secondary | ICD-10-CM | POA: Diagnosis present

## 2018-10-13 DIAGNOSIS — Z832 Family history of diseases of the blood and blood-forming organs and certain disorders involving the immune mechanism: Secondary | ICD-10-CM | POA: Diagnosis not present

## 2018-10-13 DIAGNOSIS — I110 Hypertensive heart disease with heart failure: Secondary | ICD-10-CM | POA: Diagnosis present

## 2018-10-13 DIAGNOSIS — Z8249 Family history of ischemic heart disease and other diseases of the circulatory system: Secondary | ICD-10-CM | POA: Diagnosis not present

## 2018-10-13 DIAGNOSIS — I5042 Chronic combined systolic (congestive) and diastolic (congestive) heart failure: Secondary | ICD-10-CM | POA: Diagnosis not present

## 2018-10-13 DIAGNOSIS — I259 Chronic ischemic heart disease, unspecified: Secondary | ICD-10-CM | POA: Diagnosis present

## 2018-10-13 DIAGNOSIS — Z7982 Long term (current) use of aspirin: Secondary | ICD-10-CM | POA: Diagnosis not present

## 2018-10-13 DIAGNOSIS — I2510 Atherosclerotic heart disease of native coronary artery without angina pectoris: Secondary | ICD-10-CM | POA: Diagnosis present

## 2018-10-13 DIAGNOSIS — I10 Essential (primary) hypertension: Secondary | ICD-10-CM | POA: Diagnosis not present

## 2018-10-13 DIAGNOSIS — E1165 Type 2 diabetes mellitus with hyperglycemia: Secondary | ICD-10-CM | POA: Diagnosis present

## 2018-10-13 DIAGNOSIS — E876 Hypokalemia: Secondary | ICD-10-CM | POA: Diagnosis present

## 2018-10-13 DIAGNOSIS — R000 Tachycardia, unspecified: Secondary | ICD-10-CM | POA: Diagnosis present

## 2018-10-13 DIAGNOSIS — I69319 Unspecified symptoms and signs involving cognitive functions following cerebral infarction: Secondary | ICD-10-CM | POA: Diagnosis not present

## 2018-10-13 DIAGNOSIS — R112 Nausea with vomiting, unspecified: Secondary | ICD-10-CM | POA: Diagnosis not present

## 2018-10-13 DIAGNOSIS — E785 Hyperlipidemia, unspecified: Secondary | ICD-10-CM | POA: Diagnosis not present

## 2018-10-13 DIAGNOSIS — Z794 Long term (current) use of insulin: Secondary | ICD-10-CM | POA: Diagnosis not present

## 2018-10-13 DIAGNOSIS — K219 Gastro-esophageal reflux disease without esophagitis: Secondary | ICD-10-CM | POA: Diagnosis present

## 2018-10-13 DIAGNOSIS — I236 Thrombosis of atrium, auricular appendage, and ventricle as current complications following acute myocardial infarction: Secondary | ICD-10-CM

## 2018-10-13 LAB — GLUCOSE, CAPILLARY
GLUCOSE-CAPILLARY: 269 mg/dL — AB (ref 70–99)
Glucose-Capillary: 160 mg/dL — ABNORMAL HIGH (ref 70–99)
Glucose-Capillary: 261 mg/dL — ABNORMAL HIGH (ref 70–99)
Glucose-Capillary: 251 mg/dL — ABNORMAL HIGH (ref 70–99)
Glucose-Capillary: 168 mg/dL — ABNORMAL HIGH (ref 70–99)

## 2018-10-13 LAB — URINE CULTURE

## 2018-10-13 MED ORDER — SODIUM CHLORIDE 0.9% FLUSH: 3.0000 mL | INTRAVENOUS | Status: DC | PRN

## 2018-10-13 MED ORDER — METOPROLOL TARTRATE 25 MG PO TABS
75.0000 mg | ORAL_TABLET | Freq: Two times a day (BID) | ORAL | Status: DC
  Administered 2018-10-13 – 2018-10-15 (×4): 75 mg via ORAL
  Filled 2018-10-13 (×4): qty 1

## 2018-10-13 MED ORDER — ASPIRIN 81 MG PO CHEW
81.0000 mg | CHEWABLE_TABLET | ORAL | Status: AC
  Administered 2018-10-14: 81 mg via ORAL
  Filled 2018-10-13: qty 1

## 2018-10-13 MED ORDER — FUROSEMIDE 10 MG/ML IJ SOLN
40.0000 mg | Freq: Two times a day (BID) | INTRAMUSCULAR | Status: DC
  Administered 2018-10-13 – 2018-10-15 (×4): 40 mg via INTRAVENOUS
  Filled 2018-10-13 (×4): qty 4

## 2018-10-13 MED ORDER — SODIUM CHLORIDE 0.9 % IV SOLN
INTRAVENOUS | Status: DC
  Administered 2018-10-14: 06:00:00 via INTRAVENOUS

## 2018-10-13 MED ORDER — METOPROLOL TARTRATE 25 MG PO TABS
25.0000 mg | ORAL_TABLET | Freq: Once | ORAL | Status: AC
  Administered 2018-10-13: 25 mg via ORAL
  Filled 2018-10-13: qty 1

## 2018-10-13 NOTE — Care Management Note (Signed)
Case Management Note  Patient Details  Name: GENIEVE RAMASWAMY MRN: [id number] Date of Birth: 02-19-1956  Subjective/Objective:                  62 y.o.femalewith a hx of CVA and LV thrombus on chronic Coumadin, hypertension,Type 2DMand chronic systolic HFwith plans to undergo outpatient LHC later this week due to worsening systolic HF with further decline in EF to now 20-25% (previously 40-45%), who has now been admitted for abdominal pain, intractable n/v and lethargy.  Abnormal Troponin/Chronic Systolic HF/H/o CVA/ Action/Plan: Will follow for progression of care and clinical status. Will follow for case management needs none present at this time.  Expected Discharge Date:  (unknown)               Expected Discharge Plan:  Home/Self Care  In-House Referral:     Discharge planning Services  CM Consult  Post Acute Care Choice:    Choice offered to:     DME Arranged:    DME Agency:     HH Arranged:    HH Agency:     Status of Service:  In process, will continue to follow  If discussed at Long Length of Stay Meetings, dates discussed:    Additional Comments:  Mayli, [place], RN 10/13/2018, 11:27 AM

## 2018-10-13 NOTE — Progress Notes (Signed)
PT Cancellation Note  Patient Details Name: Kristen Lozano MRN: [id number] DOB: Apr 11, 1956   Cancelled Treatment:    Reason Eval/Treat Not Completed: Medical issues which prohibited therapy, to transfer to [facility] for cardiac cath. Check back when medically ready.    Claretha Cooper 10/13/2018, 3:18 PM Tresa Endo PT Acute Rehabilitation Services Pager [phone] Office [phone]

## 2018-10-13 NOTE — Progress Notes (Signed)
Progress Note  Patient Name: Kristen Lozano Date of Encounter: 10/13/2018  Primary Cardiologist: Pixie Casino, MD   Subjective   Appears more alert today. Communicating more. There is some cognitive deficit due to prior stroke. She is eating today and tolerating diet well. No nausea today. Reports that she is breathing ok. No CP.   Inpatient Medications    Scheduled Meds: . aspirin  81 mg Oral Daily  . atorvastatin  20 mg Oral q1800  . dicyclomine  20 mg Intramuscular Once  . enoxaparin (LOVENOX) injection  40 mg Subcutaneous Q24H  . feeding supplement (ENSURE ENLIVE)  237 mL Oral TID BM  . guaiFENesin  15 mL Oral QID  . insulin aspart  0-5 Units Subcutaneous QHS  . insulin aspart  0-9 Units Subcutaneous TID WC  . insulin glargine  10 Units Subcutaneous QHS  . lisinopril  5 mg Oral Daily  . metoprolol tartrate  50 mg Oral BID  . pantoprazole (PROTONIX) IV  40 mg Intravenous Q24H  . sodium chloride flush  3 mL Intravenous Q12H   Continuous Infusions:  PRN Meds: acetaminophen **OR** acetaminophen, albuterol, docusate sodium, hydrALAZINE, metoprolol tartrate, ondansetron **OR** ondansetron (ZOFRAN) IV, promethazine, senna-docusate, traMADol   Vital Signs    Vitals:   10/12/18 2235 10/13/18 0200 10/13/18 0400 10/13/18 0500  BP: 121/76 105/64 108/74   Pulse: (!) 105 88 (!) 105   Resp:  18 18   Temp:   (!) 97.5 F (36.4 C)   TempSrc:   Oral   SpO2:  96% 97%   Weight:    47.7 kg  Height:       No intake or output data in the 24 hours ending 10/13/18 0807 Filed Weights   10/11/18 0843 10/13/18 0500  Weight: 51.7 kg 47.7 kg    Telemetry    Sinus tachycardia 120s - Personally Reviewed  ECG    Not performed today  - Personally Reviewed  Physical Exam   GEN: thin appearing female in no acute distress.  Dentition in poor repair.  Neck: No JVD Cardiac: Reg rhythm, tachy rate no murmurs, rubs, or gallops.  Respiratory: Clear to auscultation bilaterally. GI:  Soft, nontender, non-distended  MS: No edema; No deformity. Neuro:  cognitive deficit 2/2 prior stroke Psych: Normal affect   Labs    Chemistry Recent Labs  Lab 10/11/18 1300 10/12/18 0328  NA 137 136  K 3.9 3.4*  CL 104 103  CO2 24 23  GLUCOSE 232* 200*  BUN 12 9  CREATININE 0.69 0.66  CALCIUM 8.5* 8.6*  PROT 6.3*  --   ALBUMIN 3.3*  --   AST 24  --   ALT 33  --   ALKPHOS 38  --   BILITOT 1.5*  --   GFRNONAA >60 >60  GFRAA >60 >60  ANIONGAP 9 10     Hematology Recent Labs  Lab 10/11/18 1300 10/12/18 0328  WBC 9.5 9.7  RBC 6.17* 5.87*  HGB 13.5 12.8  HCT 42.3 39.7  MCV 68.6* 67.6*  MCH 21.9* 21.8*  MCHC 31.9 32.2  RDW 16.9* 16.0*  PLT 273 275    Cardiac Enzymes Recent Labs  Lab 10/11/18 2051 10/12/18 0328 10/12/18 0848  TROPONINI 0.03* 0.03* 0.03*    Recent Labs  Lab 10/11/18 0927  TROPIPOC 0.03     BNP Recent Labs  Lab 10/11/18 2051  BNP 957.6*     DDimer No results for input(s): DDIMER in the last 168 hours.  Radiology    Ct Head Wo Contrast  Result Date: 10/11/2018 CLINICAL DATA:  Altered mental status EXAM: CT HEAD WITHOUT CONTRAST TECHNIQUE: Contiguous axial images were obtained from the base of the skull through the vertex without intravenous contrast. COMPARISON:  Head CT 09/04/2014 FINDINGS: Brain: There is no mass, hemorrhage or extra-axial collection. There is generalized atrophy without lobar predilection. There are nonacute infarcts of both parietal lobes, likely chronic, but new since 04/01/2014. There is an old left basal ganglia infarct. There is periventricular hypoattenuation compatible with chronic microvascular disease. Vascular: No abnormal hyperdensity of the major intracranial arteries or dural venous sinuses. No intracranial atherosclerosis. Skull: The visualized skull base, calvarium and extracranial soft tissues are normal. Sinuses/Orbits: No fluid levels or advanced mucosal thickening of the visualized paranasal  sinuses. No mastoid or middle ear effusion. The orbits are normal. IMPRESSION: 1. No acute intracranial abnormality. 2. Multiple old infarcts. 3. Atrophy and chronic ischemic microangiopathy. Electronically Signed   By: Ulyses Jarred M.D.   On: 10/11/2018 23:55   Ct Angio Chest Pe W Or Wo Contrast  Result Date: 10/12/2018 CLINICAL DATA:  Nausea and vomiting.  Cough and runny nose. EXAM: CT ANGIOGRAPHY CHEST CT ABDOMEN AND PELVIS WITH CONTRAST TECHNIQUE: Multidetector CT imaging of the chest was performed using the standard protocol during bolus administration of intravenous contrast. Multiplanar CT image reconstructions and MIPs were obtained to evaluate the vascular anatomy. Multidetector CT imaging of the abdomen and pelvis was performed using the standard protocol during bolus administration of intravenous contrast. CONTRAST:  156mL ISOVUE-370 IOPAMIDOL (ISOVUE-370) INJECTION 76% COMPARISON:  CT of the chest October 28, 2016 and CT the abdomen and pelvis September 01, 2018 FINDINGS: CTA CHEST FINDINGS Cardiovascular: There is mild atherosclerotic change in the thoracic aortic arch. No aneurysm or obvious dissection. Cardiomegaly. No pulmonary emboli. Mediastinum/Nodes: Moderate bilateral pleural effusions. No adenopathy or other abnormality. Lungs/Pleura: Central airways are normal. No pneumothorax. Moderate bilateral pleural effusions with associated atelectasis. No suspicious nodules or masses. No other infiltrates. Musculoskeletal: See below Review of the MIP images confirms the above findings. CT ABDOMEN and PELVIS FINDINGS Hepatobiliary: Heterogeneous attenuation liver consistent with a not mega liver suggesting elevated right heart pressures. No suspicious mass. Limited views of the gallbladder are unremarkable. The main pulmonary vein is well opacified. Pancreas: Unremarkable. No pancreatic ductal dilatation or surrounding inflammatory changes. Spleen: Normal in size without focal abnormality.  Adrenals/Urinary Tract: Evaluation limited due to motion. Regions of cortical thinning in the kidneys, likely from previous infarcts are infections. No obvious mass, hydronephrosis, or perinephric stranding. No ureteral stones noted. The bladder is unremarkable. Stomach/Bowel: The stomach and small bowel are unremarkable. The colon is unremarkable. The appendix is not well assessed but there is no secondary evidence of appendicitis. Vascular/Lymphatic: No aneurysmal dilatation or dissection noted. No adenopathy. Reproductive: Uterus and bilateral adnexa are unremarkable. Other: Increased attenuation diffusely throughout the subcutaneous fat, likely from volume overload. Musculoskeletal: No acute or significant osseous findings. Review of the MIP images confirms the above findings. IMPRESSION: 1. No pulmonary emboli. 2. Moderate bilateral pleural effusions with underlying atelectasis. 3. Atherosclerotic change in the aorta. 4. Heterogeneous nutmeg appearance to the liver, likely due to vascular congestion from elevated right heart pressures. 5. Evaluation of the remainder of the abdomen is limited due to motion. No other acute abnormalities. Electronically Signed   By: Dorise Bullion III M.D   On: 10/12/2018 00:16   Ct Abdomen Pelvis W Contrast  Result Date: 10/12/2018 CLINICAL DATA:  Nausea  and vomiting.  Cough and runny nose. EXAM: CT ANGIOGRAPHY CHEST CT ABDOMEN AND PELVIS WITH CONTRAST TECHNIQUE: Multidetector CT imaging of the chest was performed using the standard protocol during bolus administration of intravenous contrast. Multiplanar CT image reconstructions and MIPs were obtained to evaluate the vascular anatomy. Multidetector CT imaging of the abdomen and pelvis was performed using the standard protocol during bolus administration of intravenous contrast. CONTRAST:  123mL ISOVUE-370 IOPAMIDOL (ISOVUE-370) INJECTION 76% COMPARISON:  CT of the chest October 28, 2016 and CT the abdomen and pelvis  September 01, 2018 FINDINGS: CTA CHEST FINDINGS Cardiovascular: There is mild atherosclerotic change in the thoracic aortic arch. No aneurysm or obvious dissection. Cardiomegaly. No pulmonary emboli. Mediastinum/Nodes: Moderate bilateral pleural effusions. No adenopathy or other abnormality. Lungs/Pleura: Central airways are normal. No pneumothorax. Moderate bilateral pleural effusions with associated atelectasis. No suspicious nodules or masses. No other infiltrates. Musculoskeletal: See below Review of the MIP images confirms the above findings. CT ABDOMEN and PELVIS FINDINGS Hepatobiliary: Heterogeneous attenuation liver consistent with a not mega liver suggesting elevated right heart pressures. No suspicious mass. Limited views of the gallbladder are unremarkable. The main pulmonary vein is well opacified. Pancreas: Unremarkable. No pancreatic ductal dilatation or surrounding inflammatory changes. Spleen: Normal in size without focal abnormality. Adrenals/Urinary Tract: Evaluation limited due to motion. Regions of cortical thinning in the kidneys, likely from previous infarcts are infections. No obvious mass, hydronephrosis, or perinephric stranding. No ureteral stones noted. The bladder is unremarkable. Stomach/Bowel: The stomach and small bowel are unremarkable. The colon is unremarkable. The appendix is not well assessed but there is no secondary evidence of appendicitis. Vascular/Lymphatic: No aneurysmal dilatation or dissection noted. No adenopathy. Reproductive: Uterus and bilateral adnexa are unremarkable. Other: Increased attenuation diffusely throughout the subcutaneous fat, likely from volume overload. Musculoskeletal: No acute or significant osseous findings. Review of the MIP images confirms the above findings. IMPRESSION: 1. No pulmonary emboli. 2. Moderate bilateral pleural effusions with underlying atelectasis. 3. Atherosclerotic change in the aorta. 4. Heterogeneous nutmeg appearance to the liver,  likely due to vascular congestion from elevated right heart pressures. 5. Evaluation of the remainder of the abdomen is limited due to motion. No other acute abnormalities. Electronically Signed   By: Dorise Bullion III M.D   On: 10/12/2018 00:16    Cardiac Studies   2D Echo 08/10/18  Study Conclusions  - Left ventricle: The cavity size was normal. Wall thickness was normal. Systolic function was severely reduced. The estimated ejection fraction was in the range of 20% to 25%. Diffuse hypokinesis. The study is not technically sufficient to allow evaluation of LV diastolic function. - Ventricular septum: Septal motion showed abnormal function and dyssynergy. - Aortic valve: There was trivial regurgitation. - Mitral valve: There was moderate regurgitation. - Left atrium: The atrium was mildly dilated. - Tricuspid valve: There was trivial regurgitation. - Pulmonary arteries: Systolic pressure was moderately increased. PA peak pressure: 53 mm Hg (S). - Pericardium, extracardiac: A trivial pericardial effusion was identified. There was a left pleural effusion.  Impressions:  - EF is reduced when compared to prior study (40%)  Patient Profile     Ms. Whitacre is a 62 y.o.femalewith a hx of CVA and LV thrombus on chronic Coumadin, hypertension, Type 2 DM  and chronic systolic HFwith plans to undergo outpatient LHC later this week due to worsening systolic HF with further decline in EF to now 20-25% (previously 40-45%), who has now been admitted for abdominal pain, intractable n/v and lethargy. Cardiology  consulted for mildly elevated troponin, at the request of Dr. Horris Latino, Internal Medicine.   Assessment & Plan   1. Abnormal Troponin: flat, low level trend at 0.03>>0.03>>0.03. Not c/w ACS. Suspect demand ischemia in the setting of acute GI illness, tachycardia and chronic systolic HF. She denies CP. Although troponin trend is not concerning for ACS, it has been  recommended that she undergo ischemic evaluation given further reduction in LVEF, now at 20-25% as well as her other CRFs including T2DM and HTN. LHC is tentatively scheduled for 10/14/18. Will keep plans for cath tomorrow.   2. Chronic Systolic HF: Systolic HF dates back to 2015 when she had her CVA. EF at that time was 35-40% and improved on repeat echo in 2017, up to 40-45%. EF now 20-25% on recent echo 07/2018 w/ plans to undergo LHC to r/o CAD. BNP is abnormal at 957 but she does not appear to be grossly volume overloaded. She is tachycardic and appears a bit dry. No diuretics ordered at this time. Would recommend RHC as well tomorrow. Her chest CT yesterday did show heterogenous nutmeg appearance to the liver, which the radiologist feels is likely due to vascular congestion from elevated right heart pressures. Pleural effusions also noted and she has had abdominal pain and nausea, however LFTs have been normal. RHC measurements will help to determine volume status and help guide diuresis. Continue medical therapy for systolic HF. She is currently on ? blocker therapy w/ metoprolol tartrate. Given her systolic HF, this needs to to changed to metoprolol succinate. She is currently on ACEi therapy w/ lisinopril. Recommend considering transition to [facility], however this would require a 36 hr ACEi washout, prior to initiation. Also given her persistent tachycardia and low EF, we should also consider addition of Corlanor  for better rate control as well as digoxin. We should also consider referring her to the advanced HF clinic.   3. H/o CVA: cognitive deficit due to prior stroke. Her sister is POA.  4. H/o LV Thrombus: first noted on echo in 2017. She is on chronic coumadin however currently on hold due to plans for cardiac craterization this week. Resume coumadin once all invasive procedures are complete.   5. GI: similar presentation in September w/ unremarkable w/u. Negative CT of abdomen, gastric  emptying study negative,EGD done on 09/23/2018 was also normal,with biopsy of stomach showing chronic mild gastritis,negative for H. Pylori. This admit, HFTs normal and lipase negative. Condition improved today. pt now tolerating regular diet. Further management per IM.   6. HTN: controlled this morning. 108/72. Continue to monitor.   7. DM: management per IM.   8. Persistent Sinus Tachycardia: TSH WNL. Chest CT negative for PE. WBC ct WNL. Afebrile. Hgb normal at 12.8. ScrBUN normal at 0.66/ 9. She was noted to be tachycardic during previous admission in September and metoprolol increased. She remains tachycardic. This may be the cause of her worsening LV systolic function. Given low EF, she would benefit from addition of Corlanor.     For questions or updates, please contact [place] Please consult [email or web address] for contact info under        Signed, Lyda Jester, PA-C  10/13/2018, 8:07 AM

## 2018-10-13 NOTE — Progress Notes (Signed)
Initial Nutrition Assessment  DOCUMENTATION CODES:   Non-severe (moderate) malnutrition in context of chronic illness  INTERVENTION:  - Continue Ensure Enlive TID, each supplement provides 350 kcal and 20 grams of protein. - Continue to encourage PO intakes.    NUTRITION DIAGNOSIS:   Moderate Malnutrition related to chronic illness(prior CVA) as evidenced by moderate fat depletion, moderate muscle depletion, severe muscle depletion.  GOAL:   Patient will meet greater than or equal to 90% of their needs  MONITOR:   PO intake, Supplement acceptance, Weight trends, Labs  REASON FOR ASSESSMENT:   Other (Comment)(previous malnutrition diagnosis)  ASSESSMENT:   62 y.o. female with medical history significant for HTN, Type 2 DM, history of CVA, LV thrombus, CHF, depression, anxiety, and hyperlipidemia. She presented to the ED complaining of intractable N/V with generalized abdominal pain that has been ongoing for 2 days. Patient described vomitus as clear, denies seeing any blood.  Reports generalized abdominal pain, worse around the epigastric region.  BMI indicates normal weight/borderline underweight. No intakes documented since admission. Patient recalls eating a slice of orange, a piece of bacon, and drinking orange juice for breakfast. She had also consumed ~1/2 bottle of Ensure, which she reports using to take PO medications.  Patient was last seen by a RD on 10/29. Patient reports that since that time appetite has been decreased and she has not been eating well, although she was unable to give further specifics on this. She likes Ensure and is willing to drink them. She has consumed 3 of the 4 bottles of Ensure offered to her since admission.  She reports slight nausea this AM which was not worsened by breakfast. She is unsure of the last time she vomited.   Per chart review, current weight is 105 lb and weight on 09/09/18 was 107 lb. This indicates 2 lb weight loss (2% body  weight) in the past 1 month; not significant for time frame.   Medications reviewed; sliding scale Novolog, 10 units Lantus/day. Labs reviewed; CBG: 160 mg/dL today, K: 3.4 mmol/L, Ca: 8.6 mg/dL.       NUTRITION - FOCUSED PHYSICAL EXAM:    Most Recent Value  Orbital Region  Mild depletion  Upper Arm Region  Moderate depletion  Thoracic and Lumbar Region  Moderate depletion  Buccal Region  Mild depletion  Temple Region  Mild depletion  Clavicle Bone Region  Moderate depletion  Clavicle and Acromion Bone Region  Moderate depletion  Scapular Bone Region  Mild depletion  Dorsal Hand  Mild depletion  Patellar Region  Severe depletion  Anterior Thigh Region  Moderate depletion  Posterior Calf Region  Severe depletion  Edema (RD Assessment)  None  Hair  Reviewed  Eyes  Reviewed  Mouth  Reviewed  Skin  Reviewed  Nails  Reviewed       Diet Order:   Diet Order            Diet heart healthy/carb modified Room service appropriate? Yes; Fluid consistency: Thin  Diet effective now              EDUCATION NEEDS:   Not appropriate for education at this time  Skin:     Last BM:  PTA/unknown  Height:   Ht Readings from Last 1 Encounters:  10/11/18 5\' 3"  (1.6 m)    Weight:   Wt Readings from Last 1 Encounters:  10/13/18 47.7 kg    Ideal Body Weight:  52.27 kg  BMI:  Body mass index is 18.63  kg/m.  Estimated Nutritional Needs:   Kcal:  1430-1670 (30-35 kcal/kg)  Protein:  70-80 grams  Fluid:  >/= 1.6 L/day     Jarome Matin, MS, RD, LDN, [facility] Inpatient Clinical Dietitian Pager # [phone] After hours/weekend pager # [phone]

## 2018-10-13 NOTE — Progress Notes (Signed)
PROGRESS NOTE  Kristen Lozano VEH:[id number] DOB: 1956/06/18 DOA: 10/11/2018 PCP: Audley Hose, MD  HPI/Recap of past 24 hours: Kristen Lozano is a 62 y.o. female with medical history significant for hypertension, diabetes mellitus type 2, history of CVA, LV thrombus, CHF, depression, anxiety, hyperlipidemia, presents to the ED complaining of intractable nausea/vomiting, with generalized abdominal pain that has been ongoing for the past 2 days.  Patient describes vomitus as clear, denies seeing any blood.  Reports generalized abdominal pain, worse around the epigastric region.  Patient also reports some cough and runny nose.  Bouts of coughing usually elicits vomiting. Reports some frontal headache. Patient does report compliance to her medications.  Husband at bedside, assisted with the history. Denies any illicit drug use, alcohol use.  Of note, due to significant EF dropped to 20 to 25%, cardiology planned cardiac cath to be done on 10/14/2018, was instructed to hold Coumadin for now.  Due to above symptoms patient presented to the ER.  Patient was recently discharged on 09/25/18 with similar complaints. In the ED, patient noted to be tachycardic with heart rate in the 130s, hypertensive with diastolic in the 836O, afebrile, labs unremarkable.  ED Trop negative, EKG showing sinus tachycardia with some T wave inversions in the lateral leads.  EDP spoke to cardiology who recommended admission here at [facility] for stabilization. Pt admitted for further management.   Today, pt reported feeling much better, denies any new complaints. Tolerating her diet. Remained tachycardic throughout, asymptomatic. Spoke to Cardiology, plan for cath tomorrow as scheduled. Cardiology will take over service and transfer patient to [facility].   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)   Chronic combined systolic and diastolic CHF (congestive heart failure) ([place])   Uncontrolled type 2 diabetes mellitus with hyperglycemia (HCC)   Sinus tachycardia   HLD (hyperlipidemia)   GERD (gastroesophageal reflux disease)   Systolic heart failure (HCC)  Intractable nausea/vomiting, lethargic Improved Reports associated generalized abdominal pain, headaches Unknown etiology, ??uncontrolled hypertension, although reports compliance to medication LFTs, except T bili which is at baseline, lipase, BMP, CBC all within normal limits ACS rule out: ED Trop negative, EKG with sinus tachy, T wave inversion in lateral leads CT abdomen/pelvis unremarkable CT head unremarkable Had multiple work-ups for N/V: Negative CT on previous admission, gastric emptying study negative, EGD done on 09/23/2018 was also normal, with biopsy of stomach showing chronic mild gastritis, negative for H. pylori Status post 1 L of fluids in the ED Anti-emetics, pain management, BP control Advanced diet  Uncontrolled hypertension Better controlled On presentation DBP in the 120s Reports compliance to meds Increased home lisinopril to 5 mg, held Coreg 12.5 twice daily, switch to PO lopressor for HR control IV hydralazine PRN  Sinus tachycardia Likely due to ??intractable nausea/vomiting Vs dehydration Vs tachy mediated CM EKG with sinus tachycardia, T wave inversion in lateral leads TSH WNL CTA chest negative for any PE, no evidence of PNA Hold home coreg for now and switch to PO lopressor  Mildly elevated troponin Likely 2/2 demand ischemia Cardiology on board  Chronic systolic and diastolic HF/pulmonary hypertension/LV thrombus Appears somewhat dry s/p 1L of IVF Echo done on 07/2018 showed EF of 20 to 25%, with a PA peak pressure of 53 mmHg, significantly reduced from last echo in 2017 Cardiology planned for cardiac cath  on 10/14/2018, was instructed to hold home  warfarin Continue meds as above, continue to hold warfarin, hold Lasix Cardiology on board: Plan for [facility] 10/14/18. Cardiology will take over service and transfer pt to [facility] Strict I's and O's, daily weights  Diabetes mellitus type 2 Last A1c on 10/19 was 7.4 SSI, Accu-Cheks, Lantus 10 at bedtime Hold home metformin  History of CVA Continue statins  Hyperlipidemia LDL 54 Continue statins  GERD Start IV PPI     Code Status: Full  Family Communication: None at bedside  Disposition Plan: Cardiology taking over service and will transfer pt to [facility] on 10/14/18   Consultants:  Cardiology  Procedures:  None  Antimicrobials:  None  DVT prophylaxis: Lovenox   Objective: Vitals:   10/13/18 0400 10/13/18 0500 10/13/18 0800 10/13/18 1200  BP: 108/74  124/87 116/66  Pulse: (!) 105  (!) 109 (!) 115  Resp: 18  18 (!) 27  Temp: (!) 97.5 F (36.4 C)  98.2 F (36.8 C)   TempSrc: Oral  Oral   SpO2: 97%  97% 100%  Weight:  47.7 kg    Height:        Intake/Output Summary (Last 24 hours) at 10/13/2018 1427 Last data filed at 10/13/2018 0900 Gross per 24 hour  Intake 360 ml  Output 350 ml  Net 10 ml   Filed Weights   10/11/18 0843 10/13/18 0500  Weight: 51.7 kg 47.7 kg    Exam:  General: NAD, chronically ill appearing   Cardiovascular: S1, S2 present, tachycardic  Respiratory: CTAB  Abdomen: Soft, nontender, nondistended, bowel sounds present  Musculoskeletal: No bilateral pedal edema noted  Skin: Normal  Psychiatry: Normal mood   Data Reviewed: CBC: Recent Labs  Lab 10/11/18 1300 10/12/18 0328  WBC 9.5 9.7  NEUTROABS 6.7  --   HGB 13.5 12.8  HCT 42.3 39.7  MCV 68.6* 67.6*  PLT 273 371   Basic Metabolic Panel: Recent Labs  Lab 10/11/18 1300 10/12/18 0328  NA 137 136  K 3.9 3.4*  CL 104 103  CO2 24 23  GLUCOSE 232* 200*  BUN 12 9  CREATININE 0.69 0.66  CALCIUM 8.5* 8.6*   GFR: Estimated Creatinine  Clearance: 54.2 mL/min (by C-G formula based on SCr of 0.66 mg/dL). Liver Function Tests: Recent Labs  Lab 10/11/18 1300  AST 24  ALT 33  ALKPHOS 38  BILITOT 1.5*  PROT 6.3*  ALBUMIN 3.3*   Recent Labs  Lab 10/11/18 1300  LIPASE 23   No results for input(s): AMMONIA in the last 168 hours. Coagulation Profile: Recent Labs  Lab 10/11/18 0850  INR 1.26   Cardiac Enzymes: Recent Labs  Lab 10/11/18 2051 10/12/18 0328 10/12/18 0848  TROPONINI 0.03* 0.03* 0.03*   BNP (last 3 results) No results for input(s): PROBNP in the last 8760 hours. HbA1C: No results for input(s): HGBA1C in the last 72 hours. CBG: Recent Labs  Lab 10/12/18 1229 10/12/18 1709 10/12/18 2157 10/13/18 0800 10/13/18 1135  GLUCAP 273* 214* 194* 160* 261*   Lipid Profile: Recent Labs    10/12/18 0328  CHOL 99  HDL 36*  LDLCALC 54  TRIG 45  CHOLHDL 2.8   Thyroid Function Tests: Recent Labs    10/12/18 1118  TSH 2.007   Anemia Panel: No results for input(s): VITAMINB12, FOLATE, FERRITIN, TIBC, IRON, RETICCTPCT in the last 72 hours. Urine analysis:    Component Value Date/Time   COLORURINE YELLOW 10/12/2018 0755   APPEARANCEUR  CLEAR 10/12/2018 0755   LABSPEC >1.046 (H) 10/12/2018 0755   PHURINE 6.0 10/12/2018 0755   GLUCOSEU 50 (A) 10/12/2018 0755   HGBUR NEGATIVE 10/12/2018 0755   BILIRUBINUR NEGATIVE 10/12/2018 0755   KETONESUR 5 (A) 10/12/2018 0755   PROTEINUR 30 (A) 10/12/2018 0755   UROBILINOGEN 1.0 09/07/2014 1405   NITRITE NEGATIVE 10/12/2018 0755   LEUKOCYTESUR TRACE (A) 10/12/2018 0755   Sepsis Labs: @LABRCNTIP (procalcitonin:4,lacticidven:4)  ) Recent Results (from the past 240 hour(s))  MRSA PCR Screening     Status: None   Collection Time: 10/11/18  8:00 PM  Result Value Ref Range Status   MRSA by PCR NEGATIVE NEGATIVE Final    Comment:        The GeneXpert MRSA Assay (FDA approved for NASAL specimens only), is one component of a comprehensive MRSA  colonization surveillance program. It is not intended to diagnose MRSA infection nor to guide or monitor treatment for MRSA infections. Performed at [facility], [place] [address], [place], [place] [address]   Respiratory Panel by PCR     Status: None   Collection Time: 10/12/18  7:55 AM  Result Value Ref Range Status   Adenovirus NOT DETECTED NOT DETECTED Final   Coronavirus 229E NOT DETECTED NOT DETECTED Final   Coronavirus HKU1 NOT DETECTED NOT DETECTED Final   Coronavirus NL63 NOT DETECTED NOT DETECTED Final   Coronavirus OC43 NOT DETECTED NOT DETECTED Final   Metapneumovirus NOT DETECTED NOT DETECTED Final   Rhinovirus / Enterovirus NOT DETECTED NOT DETECTED Final   Influenza A NOT DETECTED NOT DETECTED Final   Influenza B NOT DETECTED NOT DETECTED Final   Parainfluenza Virus 1 NOT DETECTED NOT DETECTED Final   Parainfluenza Virus 2 NOT DETECTED NOT DETECTED Final   Parainfluenza Virus 3 NOT DETECTED NOT DETECTED Final   Parainfluenza Virus 4 NOT DETECTED NOT DETECTED Final   Respiratory Syncytial Virus NOT DETECTED NOT DETECTED Final   Bordetella pertussis NOT DETECTED NOT DETECTED Final   Chlamydophila pneumoniae NOT DETECTED NOT DETECTED Final   Mycoplasma pneumoniae NOT DETECTED NOT DETECTED Final    Comment: Performed at [facility], [place] [address] [place], [place] [address]  Urine Culture     Status: Abnormal   Collection Time: 10/12/18  7:55 AM  Result Value Ref Range Status   Specimen Description   Final    URINE, CLEAN CATCH Performed at [facility], [place] [address] [place], [place] [address]    Special Requests   Final    NONE Performed at [facility], [place] [address] [place],  [address]    Culture MULTIPLE SPECIES PRESENT, SUGGEST RECOLLECTION (A)  Final   Report Status 10/13/2018 FINAL  Final      Studies: No results found.  Scheduled Meds: . aspirin  81 mg Oral Daily  .  atorvastatin  20 mg Oral q1800  . dicyclomine  20 mg Intramuscular Once  . enoxaparin (LOVENOX) injection  40 mg Subcutaneous Q24H  . feeding supplement (ENSURE ENLIVE)  237 mL Oral TID BM  . furosemide  40 mg Intravenous BID  . guaiFENesin  15 mL Oral QID  . insulin aspart  0-5 Units Subcutaneous QHS  . insulin aspart  0-9 Units Subcutaneous TID WC  . insulin glargine  10 Units Subcutaneous QHS  . metoprolol tartrate  25 mg Oral Once  . metoprolol tartrate  75 mg Oral BID  . pantoprazole (PROTONIX) IV  40 mg Intravenous Q24H  .  sodium chloride flush  3 mL Intravenous Q12H    Continuous Infusions:   LOS: 1 day     Alma Friendly, MD [facility]  If 7PM-7AM, please contact night-coverage [email or web address] 10/13/2018, 2:27 PM

## 2018-10-13 NOTE — Progress Notes (Signed)
10/13/18  1530  Called [facility] [phone]. Report given to [place].

## 2018-10-13 NOTE — Progress Notes (Signed)
Pt received to 3e25- pt alert, flat and not very communicative, pt somewhat withdrawn, did not want female NT to apply telemetry, st on telemetry per Lauren at [facility], verified with Jun on box 30, [place] I Primary nurse n room to assess pt

## 2018-10-14 ENCOUNTER — Ambulatory Visit (HOSPITAL_COMMUNITY): Admission: RE | Admit: 2018-10-14 | Payer: Medicare HMO | Source: Ambulatory Visit | Admitting: Cardiovascular Disease

## 2018-10-14 ENCOUNTER — Encounter (HOSPITAL_COMMUNITY)
Admission: EM | Disposition: A | Discharge status: Home Health | Payer: Self-pay | Source: Home / Self Care | Attending: Cardiovascular Disease

## 2018-10-14 ENCOUNTER — Encounter (HOSPITAL_COMMUNITY): Payer: Self-pay | Admitting: Cardiovascular Disease

## 2018-10-14 DIAGNOSIS — I5023 Acute on chronic systolic (congestive) heart failure: Secondary | ICD-10-CM

## 2018-10-14 DIAGNOSIS — I5043 Acute on chronic combined systolic (congestive) and diastolic (congestive) heart failure: Secondary | ICD-10-CM

## 2018-10-14 HISTORY — PX: RIGHT/LEFT HEART CATH AND CORONARY ANGIOGRAPHY: CATH118266

## 2018-10-14 LAB — GLUCOSE, CAPILLARY
GLUCOSE-CAPILLARY: 142 mg/dL — AB (ref 70–99)
GLUCOSE-CAPILLARY: 145 mg/dL — AB (ref 70–99)
GLUCOSE-CAPILLARY: 247 mg/dL — AB (ref 70–99)
GLUCOSE-CAPILLARY: 358 mg/dL — AB (ref 70–99)

## 2018-10-14 LAB — POCT I-STAT 3, VENOUS BLOOD GAS (G3P V)
Acid-Base Excess: 3 mmol/L — ABNORMAL HIGH (ref 0.0–2.0)
Acid-Base Excess: 3 mmol/L — ABNORMAL HIGH (ref 0.0–2.0)
Bicarbonate: 28.9 mmol/L — ABNORMAL HIGH (ref 20.0–28.0)
Bicarbonate: 29.2 mmol/L — ABNORMAL HIGH (ref 20.0–28.0)
O2 SAT: 61 %
O2 Saturation: 59 %
PCO2 VEN: 50.3 mmHg (ref 44.0–60.0)
PCO2 VEN: 50.8 mmHg (ref 44.0–60.0)
PH VEN: 7.363 (ref 7.250–7.430)
PH VEN: 7.372 (ref 7.250–7.430)
PO2 VEN: 33 mmHg (ref 32.0–45.0)
TCO2: 30 mmol/L (ref 22–32)
TCO2: 31 mmol/L (ref 22–32)
pO2, Ven: 32 mmHg (ref 32.0–45.0)

## 2018-10-14 LAB — BASIC METABOLIC PANEL
Anion gap: 9 (ref 5–15)
BUN: 10 mg/dL (ref 8–23)
CO2: 27 mmol/L (ref 22–32)
CREATININE: 0.94 mg/dL (ref 0.44–1.00)
Calcium: 8.6 mg/dL — ABNORMAL LOW (ref 8.9–10.3)
Chloride: 96 mmol/L — ABNORMAL LOW (ref 98–111)
GFR calc non Af Amer: >60 mL/min (ref >60)
Glucose, Bld: 172 mg/dL — ABNORMAL HIGH (ref 70–99)
POTASSIUM: 3.4 mmol/L — AB (ref 3.5–5.1)
SODIUM: 132 mmol/L — AB (ref 135–145)

## 2018-10-14 LAB — POCT I-STAT 3, ART BLOOD GAS (G3+)
Acid-Base Excess: 4 mmol/L — ABNORMAL HIGH (ref 0.0–2.0)
BICARBONATE: 28.9 mmol/L — AB (ref 20.0–28.0)
O2 Saturation: 96 %
PCO2 ART: 45.5 mmHg (ref 32.0–48.0)
PH ART: 7.411 (ref 7.350–7.450)
TCO2: 30 mmol/L (ref 22–32)
pO2, Arterial: 85 mmHg (ref 83.0–108.0)

## 2018-10-14 LAB — PROTIME-INR
INR: 1.13
PROTHROMBIN TIME: 14.4 s (ref 11.4–15.2)

## 2018-10-14 SURGERY — RIGHT/LEFT HEART CATH AND CORONARY ANGIOGRAPHY
Anesthesia: LOCAL

## 2018-10-14 MED ORDER — SODIUM CHLORIDE 0.9% FLUSH: 3.0000 mL | INTRAVENOUS | Status: DC | PRN

## 2018-10-14 MED ORDER — IVABRADINE HCL 5 MG PO TABS
5.0000 mg | ORAL_TABLET | Freq: Two times a day (BID) | ORAL | Status: DC
  Administered 2018-10-14 – 2018-10-16 (×5): 5 mg via ORAL
  Filled 2018-10-14 (×5): qty 1

## 2018-10-14 MED ORDER — HEPARIN (PORCINE) IN NACL 1000-0.9 UT/500ML-% IV SOLN
INTRAVENOUS | Status: DC | PRN
  Administered 2018-10-14: 500 mL

## 2018-10-14 MED ORDER — ONDANSETRON HCL 4 MG/2ML IJ SOLN: 4.0000 mg | Freq: Four times a day (QID) | INTRAMUSCULAR | Status: DC | PRN

## 2018-10-14 MED ORDER — LIDOCAINE HCL (PF) 1 % IJ SOLN
INTRAMUSCULAR | Status: DC | PRN
  Administered 2018-10-14: 15 mL

## 2018-10-14 MED ORDER — SODIUM CHLORIDE 0.9 % IV SOLN: 250.0000 mL | INTRAVENOUS | Status: DC | PRN

## 2018-10-14 MED ORDER — POTASSIUM CHLORIDE CRYS ER 20 MEQ PO TBCR
60.0000 meq | EXTENDED_RELEASE_TABLET | Freq: Once | ORAL | Status: AC
  Administered 2018-10-14: 60 meq via ORAL
  Filled 2018-10-14: qty 3

## 2018-10-14 MED ORDER — SODIUM CHLORIDE 0.9 % IV SOLN: INTRAVENOUS | Status: AC

## 2018-10-14 MED ORDER — PANTOPRAZOLE SODIUM 40 MG PO TBEC
40.0000 mg | DELAYED_RELEASE_TABLET | Freq: Every day | ORAL | Status: DC
  Administered 2018-10-14 – 2018-10-15 (×2): 40 mg via ORAL
  Filled 2018-10-14 (×3): qty 1

## 2018-10-14 MED ORDER — DIAZEPAM 5 MG PO TABS: 5.0000 mg | ORAL_TABLET | Freq: Four times a day (QID) | ORAL | Status: DC | PRN

## 2018-10-14 MED ORDER — ACETAMINOPHEN 325 MG PO TABS: 650.0000 mg | ORAL_TABLET | ORAL | Status: DC | PRN

## 2018-10-14 MED ORDER — FENTANYL CITRATE (PF) 100 MCG/2ML IJ SOLN
INTRAMUSCULAR | Status: DC | PRN
  Administered 2018-10-14: 25 ug via INTRAVENOUS

## 2018-10-14 MED ORDER — MIDAZOLAM HCL 2 MG/2ML IJ SOLN
INTRAMUSCULAR | Status: DC | PRN
  Administered 2018-10-14 (×2): 1 mg via INTRAVENOUS

## 2018-10-14 MED ORDER — IOHEXOL 350 MG/ML SOLN
INTRAVENOUS | Status: DC | PRN
  Administered 2018-10-14: 25 mL via INTRA_ARTERIAL

## 2018-10-14 MED ORDER — SODIUM CHLORIDE 0.9% FLUSH
3.0000 mL | Freq: Two times a day (BID) | INTRAVENOUS | Status: DC
  Administered 2018-10-15 – 2018-10-16 (×2): 3 mL via INTRAVENOUS

## 2018-10-14 SURGICAL SUPPLY — 10 items
CATH INFINITI 5FR MULTPACK ANG (CATHETERS) ×2 IMPLANT
CATH SWAN GANZ 7F STRAIGHT (CATHETERS) ×2 IMPLANT
KIT HEART LEFT (KITS) ×2 IMPLANT
PACK CARDIAC CATHETERIZATION (CUSTOM PROCEDURE TRAY) ×2 IMPLANT
SHEATH PINNACLE 5F 10CM (SHEATH) ×2 IMPLANT
SHEATH PINNACLE 7F 10CM (SHEATH) ×2 IMPLANT
SYR MEDRAD MARK V 150ML (SYRINGE) ×2 IMPLANT
TRANSDUCER W/STOPCOCK (MISCELLANEOUS) ×2 IMPLANT
TUBING CIL FLEX 10 FLL-RA (TUBING) ×2 IMPLANT
WIRE EMERALD 3MM-J .035X150CM (WIRE) ×2 IMPLANT

## 2018-10-14 NOTE — H&P (View-Only) (Signed)
Progress Note  Patient Name: Kristen Lozano Date of Encounter: 10/14/2018  Primary Cardiologist: Pixie Casino, MD   Subjective   Feeling well.  No chest pain or shortness of breath.  She thinks her heart feels a little bit better today.  Inpatient Medications    Scheduled Meds: . aspirin  81 mg Oral Daily  . atorvastatin  20 mg Oral q1800  . dicyclomine  20 mg Intramuscular Once  . enoxaparin (LOVENOX) injection  40 mg Subcutaneous Q24H  . feeding supplement (ENSURE ENLIVE)  237 mL Oral TID BM  . furosemide  40 mg Intravenous BID  . guaiFENesin  15 mL Oral QID  . insulin aspart  0-5 Units Subcutaneous QHS  . insulin aspart  0-9 Units Subcutaneous TID WC  . insulin glargine  10 Units Subcutaneous QHS  . metoprolol tartrate  75 mg Oral BID  . pantoprazole (PROTONIX) IV  40 mg Intravenous Q24H  . sodium chloride flush  3 mL Intravenous Q12H   Continuous Infusions: . sodium chloride 75 mL/hr at 10/14/18 0611   PRN Meds: acetaminophen **OR** acetaminophen, albuterol, docusate sodium, hydrALAZINE, metoprolol tartrate, ondansetron **OR** ondansetron (ZOFRAN) IV, promethazine, senna-docusate, sodium chloride flush, traMADol   Vital Signs    Vitals:   10/14/18 0051 10/14/18 0444 10/14/18 0818 10/14/18 0835  BP: 103/72 (!) 131/94 113/86 122/85  Pulse: 100 99 100 (!) 102  Resp: 20 20 17    Temp: (!) 97.5 F (36.4 C) 98 F (36.7 C) 98.1 F (36.7 C) 98.2 F (36.8 C)  TempSrc: Oral Oral Oral Oral  SpO2: 100% 94% 97% 100%  Weight:  47.5 kg    Height:        Intake/Output Summary (Last 24 hours) at 10/14/2018 0923 Last data filed at 10/14/2018 0653 Gross per 24 hour  Intake 292.19 ml  Output 1800 ml  Net -1507.81 ml   Filed Weights   10/13/18 0500 10/13/18 1800 10/14/18 0444  Weight: 47.7 kg 48.6 kg 47.5 kg    Telemetry    Sinus rhythm and sinus tachycardia.  Heart rates have trended down from the 110s to the 90s.- Personally Reviewed  ECG    N/A-  Personally Reviewed  Physical Exam   VS:  BP 122/85   Pulse (!) 102   Temp 98.2 F (36.8 C) (Oral)   Resp 17   Ht 5' 3"  (1.6 m)   Wt 47.5 kg   SpO2 100%   BMI 18.56 kg/m  , BMI Body mass index is 18.56 kg/m. GENERAL: Chronically ill-appearing HEENT: Pupils equal round and reactive, fundi not visualized, oral mucosa unremarkable. NECK:  No jugular venous distention, waveform within normal limits, carotid upstroke brisk and symmetric, no bruits LUNGS: Diminished at bilateral bases. HEART:  RRR.  PMI not displaced or sustained,S1 and S2 within normal limits, no S3, no S4, no clicks, no rubs, no murmurs ABD:  Flat, positive bowel sounds normal in frequency in pitch, no bruits, no rebound, no guarding, no midline pulsatile mass, no hepatomegaly, no splenomegaly EXT:  2 plus pulses throughout, no edema, no cyanosis no clubbing SKIN:  No rashes no nodules NEURO:  Cranial nerves II through XII grossly intact, motor grossly intact throughout   [place]  Lab 10/11/18 1300 10/12/18 0328 10/14/18 0412  NA 137 136 132*  K 3.9 3.4* 3.4*  CL 104 103 96*  CO2 24 23 27   GLUCOSE 232* 200* 172*  BUN 12 9 10   CREATININE  0.69 0.66 0.94  CALCIUM 8.5* 8.6* 8.6*  PROT 6.3*  --   --   ALBUMIN 3.3*  --   --   AST 24  --   --   ALT 33  --   --   ALKPHOS 38  --   --   BILITOT 1.5*  --   --   GFRNONAA >60 >60 >60  GFRAA >60 >60 >60  ANIONGAP 9 10 9      Hematology Recent Labs  Lab 10/11/18 1300 10/12/18 0328  WBC 9.5 9.7  RBC 6.17* 5.87*  HGB 13.5 12.8  HCT 42.3 39.7  MCV 68.6* 67.6*  MCH 21.9* 21.8*  MCHC 31.9 32.2  RDW 16.9* 16.0*  PLT 273 275    Cardiac Enzymes Recent Labs  Lab 10/11/18 2051 10/12/18 0328 10/12/18 0848  TROPONINI 0.03* 0.03* 0.03*    Recent Labs  Lab 10/11/18 0927  TROPIPOC 0.03     BNP Recent Labs  Lab 10/11/18 2051  BNP 957.6*     DDimer No results for input(s): DDIMER in the last 168 hours.   Radiology    No  results found.  Cardiac Studies   Echo 08/10/2018: Study Conclusions  - Left ventricle: The cavity size was normal. Wall thickness was   normal. Systolic function was severely reduced. The estimated   ejection fraction was in the range of 20% to 25%. Diffuse   hypokinesis. The study is not technically sufficient to allow   evaluation of LV diastolic function. - Ventricular septum: Septal motion showed abnormal function and   dyssynergy. - Aortic valve: There was trivial regurgitation. - Mitral valve: There was moderate regurgitation. - Left atrium: The atrium was mildly dilated. - Tricuspid valve: There was trivial regurgitation. - Pulmonary arteries: Systolic pressure was moderately increased.   PA peak pressure: 53 mm Hg (S). - Pericardium, extracardiac: A trivial pericardial effusion was   identified. There was a left pleural effusion.  Impressions:  - EF is reduced when compared to prior study (40%)  Patient Profile     62 y.o. 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    Her nausea and vomiting have resolved.  Cardiology was consulted for mildly elevated troponin.  This was all likely in the setting of acute on chronic systolic and diastolic heart failure.  Assessment & Plan    #Acute on chronic systolic and diastolic heart failure: Kristen Lozano had an echo 07/2018 that revealed reduction in her EF to 20 to 25%.  This was down from 40% in the past.  On review of her history it appears that she has been persistently tachycardic in the 110s to the 120s.  During this hospitalization and the preceding when this was thought to be due to nausea, vomiting, and volume depletion.  However, suspect that tachycardia may be the cause of her worsen systolic function.  She is going for left and right heart catheterization to better assess.  She received IV Lasix and  is net -1.5 L in the last 24 hours.  Renal function is stable.  Metoprolol was also increased and her heart rate is now in the 90s.  Lisinopril was held to allow further titration of her metoprolol.  We will add this back as blood pressure tolerates.  Ideally should be on Entresto, though her blood pressure will not tolerate this.  Based on her cardiac output on right  heart cath today we will also consider adding digoxin versus ivabradine if she remains persistently tachycardic.  #Demand ischemia: Troponin was mildly elevated at 0.03.  This is mostly consistent with demand ischemia and acute on chronic heart failure as above.  Left heart cath today as above.  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  Continue aspirin and atorvastatin.  Sister is POA.  #Prior LV thrombus: LV thrombus noted on echo in 2017.  She is on warfarin chronically but this is currently on hold for catheterization.  #Nausea/vomiting: Patient presented similarly in September.  She had a negative abdominal CT, gastric emptying study, and EGD.  Biopsy of the stomach showed mild chronic gastritis.  It is possible that this is due to heart failure.  Management as above.  For questions or updates, please contact [place] Please consult [email or web address] for contact info under        Signed, Skeet Latch, MD  10/14/2018, 9:23 AM

## 2018-10-14 NOTE — Progress Notes (Signed)
Received patient post Cath, Right femoral site level 0, dressing CDI. Instructed patient  To keep right leg straight. Will monitor accordingly.

## 2018-10-14 NOTE — Interval H&P Note (Signed)
Cath Lab Visit (complete for each Cath Lab visit)  Clinical Evaluation Leading to the Procedure:   ACS: No.  Non-ACS:    Anginal Classification: CCS III  Anti-ischemic medical therapy: Minimal Therapy (1 class of medications)  Non-Invasive Test Results: No non-invasive testing performed  Prior CABG: No previous CABG      History and Physical Interval Note:  10/14/2018 10:23 AM  Kristen Lozano  has presented today for surgery, with the diagnosis of Cardiomyopathy  The various methods of treatment have been discussed with the patient and family. After consideration of risks, benefits and other options for treatment, the patient has consented to  Procedure(s): RIGHT/LEFT HEART CATH AND CORONARY ANGIOGRAPHY (N/A) as a surgical intervention .  The patient's history has been reviewed, patient examined, no change in status, stable for surgery.  I have reviewed the patient's chart and labs.  Questions were answered to the patient's satisfaction.     Shelva Majestic

## 2018-10-14 NOTE — Progress Notes (Signed)
Site area: Right groin a 5 french arterial sheath and 7 french venous sheath was removed   Site Prior to Removal:  Level 0  Pressure Applied For 20 MINUTES    Bedrest beginning at 1200p   Manual:   Yes.    Patient Status During Pull:  stable  Post Pull Groin Site:  Level 0  Post Pull Instructions Given:  Yes.    Post Pull Pulses Present:  Yes.    Dressing Applied:  Yes.    Comments:  VS remain stable

## 2018-10-14 NOTE — Progress Notes (Signed)
All through out the span of 4 hr bedrest.Patient keeps on bending and moving her right leg. This Rn keeps on reminding and re enforcing instructions to patient but pt seems to cannot understand or keeps on forgetting. Family at bedside and seems upset the we did not allow pt to be on the commode and will even let the pt sit abruptly on the bed.Noted a little bruising  At this time. Will monitor.

## 2018-10-14 NOTE — Progress Notes (Signed)
Progress Note  Patient Name: Kristen Lozano Date of Encounter: 10/14/2018  Primary Cardiologist: Kristen Casino, MD   Subjective   Feeling well.  No chest pain or shortness of breath.  She thinks her heart feels a little bit better today.  Inpatient Medications    Scheduled Meds: . aspirin  81 mg Oral Daily  . atorvastatin  20 mg Oral q1800  . dicyclomine  20 mg Intramuscular Once  . enoxaparin (LOVENOX) injection  40 mg Subcutaneous Q24H  . feeding supplement (ENSURE ENLIVE)  237 mL Oral TID BM  . furosemide  40 mg Intravenous BID  . guaiFENesin  15 mL Oral QID  . insulin aspart  0-5 Units Subcutaneous QHS  . insulin aspart  0-9 Units Subcutaneous TID WC  . insulin glargine  10 Units Subcutaneous QHS  . metoprolol tartrate  75 mg Oral BID  . pantoprazole (PROTONIX) IV  40 mg Intravenous Q24H  . sodium chloride flush  3 mL Intravenous Q12H   Continuous Infusions: . sodium chloride 75 mL/hr at 10/14/18 0611   PRN Meds: acetaminophen **OR** acetaminophen, albuterol, docusate sodium, hydrALAZINE, metoprolol tartrate, ondansetron **OR** ondansetron (ZOFRAN) IV, promethazine, senna-docusate, sodium chloride flush, traMADol   Vital Signs    Vitals:   10/14/18 0051 10/14/18 0444 10/14/18 0818 10/14/18 0835  BP: 103/72 (!) 131/94 113/86 122/85  Pulse: 100 99 100 (!) 102  Resp: 20 20 17    Temp: (!) 97.5 F (36.4 C) 98 F (36.7 C) 98.1 F (36.7 C) 98.2 F (36.8 C)  TempSrc: Oral Oral Oral Oral  SpO2: 100% 94% 97% 100%  Weight:  47.5 kg    Height:        Intake/Output Summary (Last 24 hours) at 10/14/2018 0923 Last data filed at 10/14/2018 0653 Gross per 24 hour  Intake 292.19 ml  Output 1800 ml  Net -1507.81 ml   Filed Weights   10/13/18 0500 10/13/18 1800 10/14/18 0444  Weight: 47.7 kg 48.6 kg 47.5 kg    Telemetry    Sinus rhythm and sinus tachycardia.  Heart rates have trended down from the 110s to the 90s.- Personally Reviewed  ECG    N/A-  Personally Reviewed  Physical Exam   VS:  BP 122/85   Pulse (!) 102   Temp 98.2 F (36.8 C) (Oral)   Resp 17   Ht 5' 3"  (1.6 m)   Wt 47.5 kg   SpO2 100%   BMI 18.56 kg/m  , BMI Body mass index is 18.56 kg/m. GENERAL: Chronically ill-appearing HEENT: Pupils equal round and reactive, fundi not visualized, oral mucosa unremarkable. NECK:  No jugular venous distention, waveform within normal limits, carotid upstroke brisk and symmetric, no bruits LUNGS: Diminished at bilateral bases. HEART:  RRR.  PMI not displaced or sustained,S1 and S2 within normal limits, no S3, no S4, no clicks, no rubs, no murmurs ABD:  Flat, positive bowel sounds normal in frequency in pitch, no bruits, no rebound, no guarding, no midline pulsatile mass, no hepatomegaly, no splenomegaly EXT:  2 plus pulses throughout, no edema, no cyanosis no clubbing SKIN:  No rashes no nodules NEURO:  Cranial nerves II through XII grossly intact, motor grossly intact throughout   [place]  Lab 10/11/18 1300 10/12/18 0328 10/14/18 0412  NA 137 136 132*  K 3.9 3.4* 3.4*  CL 104 103 96*  CO2 24 23 27   GLUCOSE 232* 200* 172*  BUN 12 9 10   CREATININE  0.69 0.66 0.94  CALCIUM 8.5* 8.6* 8.6*  PROT 6.3*  --   --   ALBUMIN 3.3*  --   --   AST 24  --   --   ALT 33  --   --   ALKPHOS 38  --   --   BILITOT 1.5*  --   --   GFRNONAA >60 >60 >60  GFRAA >60 >60 >60  ANIONGAP 9 10 9      Hematology Recent Labs  Lab 10/11/18 1300 10/12/18 0328  WBC 9.5 9.7  RBC 6.17* 5.87*  HGB 13.5 12.8  HCT 42.3 39.7  MCV 68.6* 67.6*  MCH 21.9* 21.8*  MCHC 31.9 32.2  RDW 16.9* 16.0*  PLT 273 275    Cardiac Enzymes Recent Labs  Lab 10/11/18 2051 10/12/18 0328 10/12/18 0848  TROPONINI 0.03* 0.03* 0.03*    Recent Labs  Lab 10/11/18 0927  TROPIPOC 0.03     BNP Recent Labs  Lab 10/11/18 2051  BNP 957.6*     DDimer No results for input(s): DDIMER in the last 168 hours.   Radiology    No  results found.  Cardiac Studies   Echo 08/10/2018: Study Conclusions  - Left ventricle: The cavity size was normal. Wall thickness was   normal. Systolic function was severely reduced. The estimated   ejection fraction was in the range of 20% to 25%. Diffuse   hypokinesis. The study is not technically sufficient to allow   evaluation of LV diastolic function. - Ventricular septum: Septal motion showed abnormal function and   dyssynergy. - Aortic valve: There was trivial regurgitation. - Mitral valve: There was moderate regurgitation. - Left atrium: The atrium was mildly dilated. - Tricuspid valve: There was trivial regurgitation. - Pulmonary arteries: Systolic pressure was moderately increased.   PA peak pressure: 53 mm Hg (S). - Pericardium, extracardiac: A trivial pericardial effusion was   identified. There was a left pleural effusion.  Impressions:  - EF is reduced when compared to prior study (40%)  Patient Profile     62 y.o. 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    Her nausea and vomiting have resolved.  Cardiology was consulted for mildly elevated troponin.  This was all likely in the setting of acute on chronic systolic and diastolic heart failure.  Assessment & Plan    #Acute on chronic systolic and diastolic heart failure: [name] had an echo 07/2018 that revealed reduction in her EF to 20 to 25%.  This was down from 40% in the past.  On review of her history it appears that she has been persistently tachycardic in the 110s to the 120s.  During this hospitalization and the preceding when this was thought to be due to nausea, vomiting, and volume depletion.  However, suspect that tachycardia may be the cause of her worsen systolic function.  She is going for left and right heart catheterization to better assess.  She received IV Lasix and  is net -1.5 L in the last 24 hours.  Renal function is stable.  Metoprolol was also increased and her heart rate is now in the 90s.  Lisinopril was held to allow further titration of her metoprolol.  We will add this back as blood pressure tolerates.  Ideally should be on Entresto, though her blood pressure will not tolerate this.  Based on her cardiac output on right  heart cath today we will also consider adding digoxin versus ivabradine if she remains persistently tachycardic.  #Demand ischemia: Troponin was mildly elevated at 0.03.  This is mostly consistent with demand ischemia and acute on chronic heart failure as above.  Left heart cath today as above.  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  Continue aspirin and atorvastatin.  Sister is POA.  #Prior LV thrombus: LV thrombus noted on echo in 2017.  She is on warfarin chronically but this is currently on hold for catheterization.  #Nausea/vomiting: Patient presented similarly in September.  She had a negative abdominal CT, gastric emptying study, and EGD.  Biopsy of the stomach showed mild chronic gastritis.  It is possible that this is due to heart failure.  Management as above.  For questions or updates, please contact [place] Please consult [email or web address] for contact info under        Signed, Skeet Latch, MD  10/14/2018, 9:23 AM

## 2018-10-14 NOTE — Evaluation (Signed)
Physical Therapy Evaluation Patient Details Name: Kristen Lozano MRN: [id number] DOB: Jun 04, 1956 Today's Date: 10/14/2018   History of Present Illness  Pt is a 62 y.o. female admitted to [facility] 10/11/18 with nausea/vomiting and abdominal pain; found to be tachycardic and hypertensive. Troponin likely elevated with demand ischemia and CHF. Transfer to [facility] 10/13/18 for cardiac cath; planned 11/20. PMH includes CVA (chronic cognitive deficits), HTN, DM, CHF.    Clinical Impression  Pt presents with an overall decrease in functional mobility secondary to above. PTA, pt reports mod indep with RW or SPC; ex-husband stays with her to assist with household tasks and medication management. Today, pt ambulatory with RW at supervision-level. Demonstrates flat affect and slowed processing. Pt would benefit from continued acute PT services to maximize functional mobility and independence prior to d/c with HHPT services.     Follow Up Recommendations Home health PT;Supervision - Intermittent    Equipment Recommendations  None recommended by PT    Recommendations for Other Services       Precautions / Restrictions Precautions Precautions: Fall Restrictions Weight Bearing Restrictions: No      Mobility  Bed Mobility Overal bed mobility: Independent                Transfers Overall transfer level: Modified independent Equipment used: Rolling walker (2 wheeled) Transfers: Sit to/from Stand              Ambulation/Gait Ambulation/Gait assistance: Supervision Gait Distance (Feet): 150 Feet Assistive device: Rolling walker (2 wheeled) Gait Pattern/deviations: Step-through pattern;Decreased stride length;Trunk flexed Gait velocity: Decreased Gait velocity interpretation: 1.31 - 2.62 ft/sec, indicative of limited community ambulator General Gait Details: Supervision for balance  Stairs            Wheelchair Mobility    Modified Rankin (Stroke Patients Only)       Balance  Overall balance assessment: Needs assistance Sitting-balance support: No upper extremity supported Sitting balance-Leahy Scale: Good       Standing balance-Leahy Scale: Fair Standing balance comment: Can static stand without UE support; reliant on UE support for balance while performing pericare standing over toilet                             Pertinent Vitals/Pain Pain Assessment: No/denies pain    Home Living Family/patient expects to be discharged to:: Private residence Living Arrangements: Non-relatives/Friends(ex-husband) Available Help at Discharge: Family;Available PRN/intermittently Type of Home: House Home Access: Stairs to enter Entrance Stairs-Rails: Right Entrance Stairs-Number of Steps: 1 Home Layout: One level Home Equipment: Cane - single point;Shower seat;Walker - 2 wheels Additional Comments: Reports ex-husband stays with her a lot of times to help her    Prior Function Level of Independence: Needs assistance   Gait / Transfers Assistance Needed: Mod indep with SPC or RW, prefers RW  ADL's / Homemaking Assistance Needed: Reports indep with most ADLs. Ex-husband assists with household tasks and manages medications  Comments: Pt poor historian, requiring some questions to be repeated multiple times     Hand Dominance        Extremity/Trunk Assessment   Upper Extremity Assessment Upper Extremity Assessment: Generalized weakness    Lower Extremity Assessment Lower Extremity Assessment: Generalized weakness    Cervical / Trunk Assessment Cervical / Trunk Assessment: Kyphotic  Communication      Cognition Arousal/Alertness: Awake/alert Behavior During Therapy: Flat affect Overall Cognitive Status: History of cognitive impairments - at baseline Area of Impairment: Attention;Following  commands;Safety/judgement;Awareness;Problem solving                   Current Attention Level: Sustained   Following Commands: Follows one step  commands with increased time Safety/Judgement: Decreased awareness of deficits;Decreased awareness of safety Awareness: Emergent Problem Solving: Slow processing;Decreased initiation;Requires verbal cues General Comments: Per chart, history of cognitive deficits since CVA. Slowed responses, delayed processing. Took >5 minutes to don shoes at EOB; great mobility, but shoes wouldn't fit while tied and pt with poor/slowed problem solving       General Comments      Exercises     Assessment/Plan    PT Assessment Patient needs continued PT services  PT Problem List Decreased strength;Decreased activity tolerance;Decreased balance;Decreased mobility;Decreased cognition;Decreased safety awareness       PT Treatment Interventions DME instruction;Gait training;Stair training;Functional mobility training;Therapeutic activities;Therapeutic exercise;Balance training;Cognitive remediation;Patient/family education    PT Goals (Current goals can be found in the Care Plan section)  Acute Rehab PT Goals Patient Stated Goal: "Go back home as soon as I can" PT Goal Formulation: With patient Time For Goal Achievement: 10/28/18 Potential to Achieve Goals: Good    Frequency Min 3X/week   Barriers to discharge        Co-evaluation               AM-PAC PT "6 Clicks" Daily Activity  Outcome Measure Difficulty turning over in bed (including adjusting bedclothes, sheets and blankets)?: None Difficulty moving from lying on back to sitting on the side of the bed? : None Difficulty sitting down on and standing up from a chair with arms (e.g., wheelchair, bedside commode, etc,.)?: None Help needed moving to and from a bed to chair (including a wheelchair)?: A Little Help needed walking in hospital room?: A Little Help needed climbing 3-5 steps with a railing? : A Little 6 Click Score: 21    End of Session   Activity Tolerance: Patient tolerated treatment well Patient left: in bed;with call  bell/phone within reach;with bed alarm set Nurse Communication: Mobility status PT Visit Diagnosis: Other abnormalities of gait and mobility (R26.89)    Time: 7026-3785 PT Time Calculation (min) (ACUTE ONLY): 14 min   Charges:   PT Evaluation $PT Eval Moderate Complexity: [place], PT, DPT Acute Rehabilitation Services  Pager [phone] [address] 10/14/2018, 9:57 AM

## 2018-10-15 LAB — GLUCOSE, CAPILLARY
GLUCOSE-CAPILLARY: 176 mg/dL — AB (ref 70–99)
GLUCOSE-CAPILLARY: 210 mg/dL — AB (ref 70–99)
GLUCOSE-CAPILLARY: 153 mg/dL — AB (ref 70–99)
Glucose-Capillary: 206 mg/dL — ABNORMAL HIGH (ref 70–99)

## 2018-10-15 LAB — CBC
HEMATOCRIT: 41.8 % (ref 36.0–46.0)
HEMOGLOBIN: 13.7 g/dL (ref 12.0–15.0)
MCH: 21.5 pg — AB (ref 26.0–34.0)
MCHC: 32.8 g/dL (ref 30.0–36.0)
MCV: 65.6 fL — AB (ref 80.0–100.0)
Platelets: 229 10*3/uL (ref 150–400)
RBC: 6.37 MIL/uL — AB (ref 3.87–5.11)
RDW: 16.4 % — ABNORMAL HIGH (ref 11.5–15.5)
WBC: 6.2 10*3/uL (ref 4.0–10.5)
nRBC: 0 % (ref 0.0–0.2)

## 2018-10-15 LAB — BASIC METABOLIC PANEL
Anion gap: 10 (ref 5–15)
BUN: 11 mg/dL (ref 8–23)
CHLORIDE: 95 mmol/L — AB (ref 98–111)
CO2: 29 mmol/L (ref 22–32)
Calcium: 9 mg/dL (ref 8.9–10.3)
Creatinine, Ser: 0.82 mg/dL (ref 0.44–1.00)
GFR calc Af Amer: >60 mL/min (ref >60)
GFR calc non Af Amer: >60 mL/min (ref >60)
GLUCOSE: 169 mg/dL — AB (ref 70–99)
POTASSIUM: 3.4 mmol/L — AB (ref 3.5–5.1)
SODIUM: 134 mmol/L — AB (ref 135–145)

## 2018-10-15 MED ORDER — METOPROLOL TARTRATE 50 MG PO TABS
50.0000 mg | ORAL_TABLET | Freq: Every day | ORAL | Status: AC
  Administered 2018-10-15: 50 mg via ORAL
  Filled 2018-10-15 (×2): qty 1

## 2018-10-15 MED ORDER — METOPROLOL SUCCINATE ER 100 MG PO TB24
100.0000 mg | ORAL_TABLET | Freq: Every day | ORAL | Status: DC
  Administered 2018-10-16: 100 mg via ORAL
  Filled 2018-10-15: qty 1

## 2018-10-15 MED ORDER — FUROSEMIDE 40 MG PO TABS
40.0000 mg | ORAL_TABLET | Freq: Every day | ORAL | Status: DC
  Administered 2018-10-16: 40 mg via ORAL
  Filled 2018-10-15: qty 1

## 2018-10-15 MED ORDER — LOSARTAN POTASSIUM 25 MG PO TABS
12.5000 mg | ORAL_TABLET | Freq: Every day | ORAL | Status: DC
  Administered 2018-10-15 – 2018-10-16 (×2): 12.5 mg via ORAL
  Filled 2018-10-15 (×2): qty 1

## 2018-10-15 MED ORDER — POTASSIUM CHLORIDE CRYS ER 20 MEQ PO TBCR
40.0000 meq | EXTENDED_RELEASE_TABLET | Freq: Once | ORAL | Status: AC
  Administered 2018-10-15: 40 meq via ORAL
  Filled 2018-10-15: qty 2

## 2018-10-15 NOTE — Care Management Important Message (Signed)
Important Message  Patient Details  Name: Kristen Lozano MRN: [id number] Date of Birth: 1956-07-15   Medicare Important Message Given:  Yes    Toshia Larkin [address] 10/15/2018, 2:56 PM

## 2018-10-15 NOTE — Care Management Note (Signed)
Case Management Note  Patient Details  Name: Kristen Lozano MRN: [id number] Date of Birth: 1955/12/14  Subjective/Objective:     Nausea/ vomiting, CHF              Action/Plan: Noted patient admitted 5 times in 6 months; Lives at home alone. She is currently separated from her husband check on her daily and assist her as needed in the home; PCP: Audley Hose, MD; has private insurance with [facility] Medicare with prescription drug coverage; pt/ spouse reports no problem getting medication; DME - walker, cane and rollater at home; patient could benefit from [facility] services; [facility] choice offered, pt chose [place]; Dan with [facility] called for arrangements.  Expected Discharge Date:  Possibly 10/18/2018           Expected Discharge Plan:  [place]  Discharge planning Services  CM Consult  Choice offered to:  Patient, Spouse  HH Arranged:  RN, Disease Management, PT, Nurse's Aide [place] Agency:  [place]  Status of Service:  In process, will continue to follow  Sherrilyn Rist [phone] 10/15/2018, 1:45 PM

## 2018-10-15 NOTE — Progress Notes (Signed)
Inpatient Diabetes Program Recommendations  AACE/ADA: New Consensus Statement on Inpatient Glycemic Control (2015)  Target Ranges:  Prepandial:   less than 140 mg/dL      Peak postprandial:   less than 180 mg/dL (1-2 hours)      Critically ill patients:  140 - 180 mg/dL   Lab Results  Component Value Date   GLUCAP 210 (H) 10/15/2018   HGBA1C 7.4 (H) 09/08/2018    Results for Kristen, Lozano (MRN [id number]) as of 10/15/2018 12:15  Ref. Range 10/14/2018 16:14 10/14/2018 20:41 10/15/2018 07:27 10/15/2018 11:59  Glucose-Capillary Latest Ref Range: 70 - 99 mg/dL 247 (H) 358 (H) 176 (H) 210 (H)    DM 2   Home DM meds: Lantus 10 units hs                              Meformin 1000mg  BID   Current inpatient DM meds: Lantus 10 units hs                                               Novolog sensitive scale (0-9 units) tid ac and (0-5units) hs   CBG remain elevated.    MD please consider the following inpatient diabetes insulin adjustments:  Add Novolog 3 units meal coverage tid ac  (Please add the following hold parameters:  Hold if NPO , Hold if patient eats less than 50% of meal.)   Thank you.  -- Will follow during hospitalization.--  Jonna Clark RN, MSN Diabetes Coordinator Inpatient Glycemic Control Team Team Pager: [phone] (8am-5pm)

## 2018-10-15 NOTE — Progress Notes (Signed)
Progress Note  Patient Name: Kristen Lozano Date of Encounter: 10/15/2018  Primary Cardiologist: Pixie Casino, MD   Subjective   No chest pain and no SOB, lying flat  Inpatient Medications    Scheduled Meds: . aspirin  81 mg Oral Daily  . atorvastatin  20 mg Oral q1800  . dicyclomine  20 mg Intramuscular Once  . enoxaparin (LOVENOX) injection  40 mg Subcutaneous Q24H  . feeding supplement (ENSURE ENLIVE)  237 mL Oral TID BM  . furosemide  40 mg Intravenous BID  . guaiFENesin  15 mL Oral QID  . insulin aspart  0-5 Units Subcutaneous QHS  . insulin aspart  0-9 Units Subcutaneous TID WC  . insulin glargine  10 Units Subcutaneous QHS  . ivabradine  5 mg Oral BID WC  . metoprolol tartrate  75 mg Oral BID  . pantoprazole  40 mg Oral Q supper  . sodium chloride flush  3 mL Intravenous Q12H  . sodium chloride flush  3 mL Intravenous Q12H   Continuous Infusions: . sodium chloride     PRN Meds: sodium chloride, acetaminophen **OR** acetaminophen, acetaminophen, albuterol, diazepam, docusate sodium, hydrALAZINE, metoprolol tartrate, ondansetron **OR** ondansetron (ZOFRAN) IV, ondansetron (ZOFRAN) IV, promethazine, senna-docusate, sodium chloride flush, traMADol   Vital Signs    Vitals:   10/14/18 1932 10/14/18 2140 10/15/18 0442 10/15/18 0600  BP: (!) 119/97 124/76 (!) 129/92   Pulse: (!) 104 (!) 104 (!) 113 85  Resp: 18  18   Temp: 98.1 F (36.7 C)  98 F (36.7 C)   TempSrc: Oral     SpO2: 100%  98%   Weight:      Height:        Intake/Output Summary (Last 24 hours) at 10/15/2018 1013 Last data filed at 10/15/2018 0942 Gross per 24 hour  Intake 720 ml  Output 1400 ml  Net -680 ml   Filed Weights   10/13/18 0500 10/13/18 1800 10/14/18 0444  Weight: 47.7 kg 48.6 kg 47.5 kg    Telemetry    SR to ST - Personally Reviewed  ECG    No new - Personally Reviewed  Physical Exam   GEN: No acute distress.  Difficult to understand at time.   Neck: No  JVD Cardiac: RRR, no murmurs, rubs, or gallops.  Rt groin cath site with dressing, small amount of ecchymosis.  Respiratory: Clear to auscultation bilaterally. GI: Soft, nontender, non-distended  MS: No edema; No deformity. Neuro:  Nonfocal  Psych: Normal affect   Labs    Chemistry Recent Labs  Lab 10/11/18 1300 10/12/18 0328 10/14/18 0412 10/15/18 0503  NA 137 136 132* 134*  K 3.9 3.4* 3.4* 3.4*  CL 104 103 96* 95*  CO2 24 23 27 29   GLUCOSE 232* 200* 172* 169*  BUN 12 9 10 11   CREATININE 0.69 0.66 0.94 0.82  CALCIUM 8.5* 8.6* 8.6* 9.0  PROT 6.3*  --   --   --   ALBUMIN 3.3*  --   --   --   AST 24  --   --   --   ALT 33  --   --   --   ALKPHOS 38  --   --   --   BILITOT 1.5*  --   --   --   GFRNONAA >60 >60 >60 >60  GFRAA >60 >60 >60 >60  ANIONGAP 9 10 9 10      Hematology Recent Labs  Lab 10/11/18 1300  10/12/18 0328 10/15/18 0503  WBC 9.5 9.7 6.2  RBC 6.17* 5.87* 6.37*  HGB 13.5 12.8 13.7  HCT 42.3 39.7 41.8  MCV 68.6* 67.6* 65.6*  MCH 21.9* 21.8* 21.5*  MCHC 31.9 32.2 32.8  RDW 16.9* 16.0* 16.4*  PLT 273 275 229    Cardiac Enzymes Recent Labs  Lab 10/11/18 2051 10/12/18 0328 10/12/18 0848  TROPONINI 0.03* 0.03* 0.03*    Recent Labs  Lab 10/11/18 0927  TROPIPOC 0.03     BNP Recent Labs  Lab 10/11/18 2051  BNP 957.6*     DDimer No results for input(s): DDIMER in the last 168 hours.   Radiology    No results found.  Cardiac Studies   Echo 08/10/2018: Study Conclusions  - Left ventricle: The cavity size was normal. Wall thickness was normal. Systolic function was severely reduced. The estimated ejection fraction was in the range of 20% to 25%. Diffuse hypokinesis. The study is not technically sufficient to allow evaluation of LV diastolic function. - Ventricular septum: Septal motion showed abnormal function and dyssynergy. - Aortic valve: There was trivial regurgitation. - Mitral valve: There was moderate  regurgitation. - Left atrium: The atrium was mildly dilated. - Tricuspid valve: There was trivial regurgitation. - Pulmonary arteries: Systolic pressure was moderately increased. PA peak pressure: 53 mm Hg (S). - Pericardium, extracardiac: A trivial pericardial effusion was identified. There was a left pleural effusion.  Impressions:  - EF is reduced when compared to prior study (40%)  Cardiac cath 10/14/18  Mid RCA lesion is 15% stenosed.   No significant coronary obstructive disease with minimal 10 to 20% smooth narrowing in the mid RCA and otherwise normal LAD and left circumflex vessel.  Mild right heart pressure elevation with mild pulmonary hypertension with a PA mean pressure of 26 mm.  LVEDP 16 mm.  Recent echo Doppler with EF 20 to 25% and with patient's history of LV thrombus, left ventricular thrombus was not done.  RECOMMENDATION: Optimal guideline medical therapy for heart failure with reduced EF.  If blood pressure allows, consider Entresto.  With increased heart rate consider a ivabradine. Recommend resumption of anticoagulation with warfarin tomorrow.   Most Recent Value  Fick Cardiac Output 3.16 L/min  Fick Cardiac Output Index 2.12 (L/min)/BSA  Thermal Cardiac Output 2.38 L/min  Thermal Cardiac Output Index 1.6 (L/min)/BSA  RA A Wave 8 mmHg  RA V Wave 8 mmHg  RA Mean 7 mmHg  RV Systolic Pressure 37 mmHg  RV Diastolic Pressure 3 mmHg  RV EDP 8 mmHg  PA Systolic Pressure 41 mmHg  PA Diastolic Pressure 18 mmHg  PA Mean 26 mmHg  PW A Wave 18 mmHg  PW V Wave 19 mmHg  PW Mean 17 mmHg  AO Systolic Pressure 96 mmHg  AO Diastolic Pressure 59 mmHg  AO Mean 74 mmHg  LV Systolic Pressure 416 mmHg  LV Diastolic Pressure 5 mmHg  LV EDP 16 mmHg  AOp Systolic Pressure 606 mmHg  AOp Diastolic Pressure 62 mmHg  AOp Mean Pressure 76 mmHg  LVp Systolic Pressure 301 mmHg  LVp Diastolic Pressure 4 mmHg  LVp EDP Pressure 16 mmHg  TPVR Index 16.29 HRUI   TSVR Index 46.36 HRUI  PVR SVR Ratio 0.13  TPVR/TSVR Ratio 0.35   Diagnostic Diagram          Patient Profile     62 y.o. 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  Her nausea and vomiting have resolved. Cardiology was consulted for mildly elevated troponin.  This was all likely in the setting of acute on chronic systolic and diastolic heart failure.  Assessment & Plan    Acute on chronic systolic and diastolic HF EF now 65-78%  With no significant CAD.  HR has been persistently in the 110-120s.  This may be cause of decreased Ef.   --neg 957 since admit and not weighed today but yesterday wt is down from 51.7 on admit to 47.5 yesterday. --on lasix 40 mg IV BID  NICM with EF 20-25%  --on BB 75 BID, Entresto would be beneficial but BP and cost may be an issue add ARB low dose for now- defer to Dr. Oval Linsey.    ST now on  ivabradine and HR in 80s.   IDDM- on SSI and lantus. Glucose up to 358 last evening.    Hypokalemia, will replace   Hx CVA with persistent cognitive deficits, on asa and atorvastatin.   For questions or updates, please contact [place] Please consult [email or web address] for contact info under        Signed, Cecilie Kicks, NP  10/15/2018, 10:13 AM

## 2018-10-15 NOTE — Progress Notes (Signed)
Pt takes meds around 7pm at home, pt does not feel comfortable taking meds later than this, she feels more comfortable taking meds by her husband as well, will continue to monitor, Thanks Arvella Nigh RN.

## 2018-10-16 ENCOUNTER — Other Ambulatory Visit: Payer: Self-pay | Admitting: Cardiology

## 2018-10-16 ENCOUNTER — Encounter (HOSPITAL_COMMUNITY): Payer: Self-pay | Admitting: Cardiology

## 2018-10-16 ENCOUNTER — Telehealth: Payer: Self-pay | Admitting: Internal Medicine

## 2018-10-16 DIAGNOSIS — Z79899 Other long term (current) drug therapy: Secondary | ICD-10-CM

## 2018-10-16 DIAGNOSIS — I5041 Acute combined systolic (congestive) and diastolic (congestive) heart failure: Secondary | ICD-10-CM | POA: Diagnosis present

## 2018-10-16 HISTORY — DX: Acute combined systolic (congestive) and diastolic (congestive) heart failure: I50.41

## 2018-10-16 LAB — GLUCOSE, CAPILLARY
GLUCOSE-CAPILLARY: 171 mg/dL — AB (ref 70–99)
GLUCOSE-CAPILLARY: 272 mg/dL — AB (ref 70–99)
Glucose-Capillary: 180 mg/dL — ABNORMAL HIGH (ref 70–99)

## 2018-10-16 LAB — BASIC METABOLIC PANEL
Anion gap: 10 (ref 5–15)
BUN: 9 mg/dL (ref 8–23)
CO2: 29 mmol/L (ref 22–32)
CREATININE: 0.84 mg/dL (ref 0.44–1.00)
Calcium: 9.2 mg/dL (ref 8.9–10.3)
Chloride: 94 mmol/L — ABNORMAL LOW (ref 98–111)
GFR calc Af Amer: >60 mL/min (ref >60)
GFR calc non Af Amer: >60 mL/min (ref >60)
Glucose, Bld: 199 mg/dL — ABNORMAL HIGH (ref 70–99)
Potassium: 3.6 mmol/L (ref 3.5–5.1)
SODIUM: 133 mmol/L — AB (ref 135–145)

## 2018-10-16 MED ORDER — PANTOPRAZOLE SODIUM 40 MG PO TBEC: 40.0000 mg | DELAYED_RELEASE_TABLET | Freq: Every day | ORAL | 6 refills | Status: DC

## 2018-10-16 MED ORDER — LOSARTAN POTASSIUM 25 MG PO TABS: 12.5000 mg | ORAL_TABLET | Freq: Every day | ORAL | 6 refills | Status: DC

## 2018-10-16 MED ORDER — METOPROLOL SUCCINATE ER 50 MG PO TB24: 50.0000 mg | ORAL_TABLET | Freq: Every day | ORAL | Status: DC

## 2018-10-16 MED ORDER — WARFARIN SODIUM 7.5 MG PO TABS
7.5000 mg | ORAL_TABLET | Freq: Once | ORAL | Status: AC
  Administered 2018-10-16: 7.5 mg via ORAL
  Filled 2018-10-16: qty 1

## 2018-10-16 MED ORDER — METFORMIN HCL ER 500 MG PO TB24: 1000.0000 mg | ORAL_TABLET | Freq: Two times a day (BID) | ORAL | 3 refills | Status: DC

## 2018-10-16 MED ORDER — METOPROLOL SUCCINATE ER 100 MG PO TB24: 100.0000 mg | ORAL_TABLET | Freq: Every day | ORAL | 6 refills | Status: DC

## 2018-10-16 MED ORDER — IVABRADINE HCL 5 MG PO TABS: 5.0000 mg | ORAL_TABLET | Freq: Two times a day (BID) | ORAL | 6 refills | Status: DC

## 2018-10-16 MED ORDER — GUAIFENESIN 100 MG/5ML PO SOLN: 15.0000 mL | Freq: Four times a day (QID) | ORAL | 0 refills | Status: DC

## 2018-10-16 MED ORDER — WARFARIN SODIUM 5 MG PO TABS: ORAL_TABLET | ORAL | 2 refills | Status: DC

## 2018-10-16 MED ORDER — FUROSEMIDE 40 MG PO TABS: 40.0000 mg | ORAL_TABLET | Freq: Every day | ORAL | 6 refills | Status: DC

## 2018-10-16 MED ORDER — DOCUSATE SODIUM 100 MG PO CAPS: 100.0000 mg | ORAL_CAPSULE | Freq: Two times a day (BID) | ORAL | Status: AC | PRN

## 2018-10-16 MED ORDER — WARFARIN - PHARMACIST DOSING INPATIENT
Freq: Every day | Status: DC
  Administered 2018-10-16: 18:00:00

## 2018-10-16 MED ORDER — METOPROLOL SUCCINATE ER 50 MG PO TB24: 50.0000 mg | ORAL_TABLET | Freq: Every day | ORAL | 6 refills | Status: DC

## 2018-10-16 NOTE — Progress Notes (Signed)
ANTICOAGULATION CONSULT NOTE - Follow Up Consult  Pharmacy Consult for Warfarin Indication: atrial fibrillation  No Known Allergies  Patient Measurements: Height: 5\' 3"  (160 cm) Weight: 100 lb (45.4 kg) IBW/kg (Calculated) : 52.4   Vital Signs: Temp: 98.3 F (36.8 C) (11/22 0508) Temp Source: Oral (11/22 0508) BP: 128/84 (11/22 0823) Pulse Rate: 105 (11/22 0823)  Labs: Recent Labs    10/14/18 0412 10/15/18 0503 10/16/18 0515  HGB  --  13.7  --   HCT  --  41.8  --   PLT  --  229  --   LABPROT 14.4  --   --   INR 1.13  --   --   CREATININE 0.94 0.82 0.84    Estimated Creatinine Clearance: 49.1 mL/min (by C-G formula based on SCr of 0.84 mg/dL).  Assessment: 62 year old to resume warfarin s/p cath for LV thrombus / stroke  Goal of Therapy:  INR 2-3 Monitor platelets by anticoagulation protocol: Yes   Plan:  Warfarin 7.5 mg po x 1 Daily INR  Thank you Anette Guarneri, PharmD [phone]  10/16/2018,11:02 AM

## 2018-10-16 NOTE — Progress Notes (Signed)
Progress Note  Patient Name: Kristen Lozano Date of Encounter: 10/16/2018  Primary Cardiologist: Pixie Casino, MD   Subjective   Feeling well.  No chest pain or shortness of breath.    Inpatient Medications    Scheduled Meds: . aspirin  81 mg Oral Daily  . atorvastatin  20 mg Oral q1800  . dicyclomine  20 mg Intramuscular Once  . enoxaparin (LOVENOX) injection  40 mg Subcutaneous Q24H  . feeding supplement (ENSURE ENLIVE)  237 mL Oral TID BM  . furosemide  40 mg Oral Daily  . guaiFENesin  15 mL Oral QID  . insulin aspart  0-5 Units Subcutaneous QHS  . insulin aspart  0-9 Units Subcutaneous TID WC  . insulin glargine  10 Units Subcutaneous QHS  . ivabradine  5 mg Oral BID WC  . losartan  12.5 mg Oral Daily  . metoprolol succinate  100 mg Oral Daily  . pantoprazole  40 mg Oral Q supper  . sodium chloride flush  3 mL Intravenous Q12H  . sodium chloride flush  3 mL Intravenous Q12H   Continuous Infusions: . sodium chloride     PRN Meds: sodium chloride, acetaminophen **OR** acetaminophen, acetaminophen, albuterol, diazepam, docusate sodium, hydrALAZINE, metoprolol tartrate, ondansetron **OR** ondansetron (ZOFRAN) IV, ondansetron (ZOFRAN) IV, promethazine, senna-docusate, sodium chloride flush, traMADol   Vital Signs    Vitals:   10/15/18 1920 10/15/18 2145 10/16/18 0508 10/16/18 0823  BP: 104/72 109/76 123/87 128/84  Pulse: 92 96  (!) 105  Resp: 18  18   Temp: 98.5 F (36.9 C)  98.3 F (36.8 C)   TempSrc: Oral  Oral   SpO2: 100%  96% 100%  Weight:   45.4 kg   Height:        Intake/Output Summary (Last 24 hours) at 10/16/2018 1037 Last data filed at 10/16/2018 1005 Gross per 24 hour  Intake 720 ml  Output 900 ml  Net -180 ml   Filed Weights   10/13/18 1800 10/14/18 0444 10/16/18 0508  Weight: 48.6 kg 47.5 kg 45.4 kg    Telemetry    Sinus rhythm and sinus tachycardia.  Heart rate 80s-100s.- Personally Reviewed  ECG    N/A- Personally  Reviewed  Physical Exam   VS:  BP 128/84 (BP Location: Right Arm)   Pulse (!) 105   Temp 98.3 F (36.8 C) (Oral)   Resp 18   Ht 5' 3"  (1.6 m)   Wt 45.4 kg   SpO2 100%   BMI 17.71 kg/m  , BMI Body mass index is 17.71 kg/m. GENERAL: Chronically ill-appearing HEENT: Pupils equal round and reactive, fundi not visualized, oral mucosa unremarkable. NECK:  No jugular venous distention, waveform within normal limits, carotid upstroke brisk and symmetric, no bruits LUNGS: Diminished at bilateral bases. HEART:  RRR.  PMI not displaced or sustained,S1 and S2 within normal limits, no S3, no S4, no clicks, no rubs, no murmurs ABD:  Flat, positive bowel sounds normal in frequency in pitch, no bruits, no rebound, no guarding, no midline pulsatile mass, no hepatomegaly, no splenomegaly EXT:  2 plus pulses throughout, no edema, no cyanosis no clubbing SKIN:  No rashes no nodules NEURO:  Cranial nerves II through XII grossly intact, motor grossly intact throughout   Carlos  Lab 10/11/18 1300  10/14/18 0412 10/15/18 0503 10/16/18 0515  NA 137   < > 132* 134* 133*  K 3.9   < > 3.4* 3.4* 3.6  CL 104   < > 96* 95* 94*  CO2 24   < > 27 29 29   GLUCOSE 232*   < > 172* 169* 199*  BUN 12   < > 10 11 9   CREATININE 0.69   < > 0.94 0.82 0.84  CALCIUM 8.5*   < > 8.6* 9.0 9.2  PROT 6.3*  --   --   --   --   ALBUMIN 3.3*  --   --   --   --   AST 24  --   --   --   --   ALT 33  --   --   --   --   ALKPHOS 38  --   --   --   --   BILITOT 1.5*  --   --   --   --   GFRNONAA >60   < > >60 >60 >60  GFRAA >60   < > >60 >60 >60  ANIONGAP 9   < > 9 10 10    < > = values in this interval not displayed.     Hematology Recent Labs  Lab 10/11/18 1300 10/12/18 0328 10/15/18 0503  WBC 9.5 9.7 6.2  RBC 6.17* 5.87* 6.37*  HGB 13.5 12.8 13.7  HCT 42.3 39.7 41.8  MCV 68.6* 67.6* 65.6*  MCH 21.9* 21.8* 21.5*  MCHC 31.9 32.2 32.8  RDW 16.9* 16.0* 16.4*  PLT 273 275 229     Cardiac Enzymes Recent Labs  Lab 10/11/18 2051 10/12/18 0328 10/12/18 0848  TROPONINI 0.03* 0.03* 0.03*    Recent Labs  Lab 10/11/18 0927  TROPIPOC 0.03     BNP Recent Labs  Lab 10/11/18 2051  BNP 957.6*     DDimer No results for input(s): DDIMER in the last 168 hours.   Radiology    No results found.  Cardiac Studies   Echo 08/10/2018: Study Conclusions  - Left ventricle: The cavity size was normal. Wall thickness was   normal. Systolic function was severely reduced. The estimated   ejection fraction was in the range of 20% to 25%. Diffuse   hypokinesis. The study is not technically sufficient to allow   evaluation of LV diastolic function. - Ventricular septum: Septal motion showed abnormal function and   dyssynergy. - Aortic valve: There was trivial regurgitation. - Mitral valve: There was moderate regurgitation. - Left atrium: The atrium was mildly dilated. - Tricuspid valve: There was trivial regurgitation. - Pulmonary arteries: Systolic pressure was moderately increased.   PA peak pressure: 53 mm Hg (S). - Pericardium, extracardiac: A trivial pericardial effusion was   identified. There was a left pleural effusion.  Impressions:  - EF is reduced when compared to prior study (40%)  Patient Profile     62 y.o. 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    Her nausea and vomiting have resolved.  Cardiology was consulted for mildly elevated troponin.  This was all likely in the setting of acute on chronic systolic and diastolic heart failure.  Assessment & Plan    #Acute on chronic systolic and diastolic heart failure: Kristen Lozano had an echo 07/2018 that revealed reduction in her EF to 20 to 25%.  This was down from 40% in the past.  On review of her history it appears that she has been persistently tachycardic in the 110s  to the 120s.  During this hospitalization and the preceding when this was thought to be due to nausea, vomiting, and volume depletion.  However, suspect that tachycardia may be the cause of her worsen systolic function.  LHC showed no significant CAD.  She was started on metoprolol succinate, losartan and ivabradine with an improvement in her heart rate.  He heart rate continues to go into the 100s at times.  Will change metoprolol to 150m in the am, [id number].  Switched to oral lasix yesterday and her renal function is stable.  Check BMP in one week.  If renal function and BP remain stable, would increase losartan and add spironolactone.  She will need a repeat echo in 3 months for LVEF assessment.   #Demand ischemia: Troponin was mildly elevated at 0.03.  This is mostly consistent with demand ischemia and acute on chronic heart failure as above.  No significant CAD on cath.   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  Continue aspirin and atorvastatin.  Sister is POA.  #Prior LV thrombus: LV thrombus noted on echo in 2017.  Warfarin was resumed post cath.  Will need close INR checks.   #Nausea/vomiting: Patient presented similarly in September.  She had a negative abdominal CT, gastric emptying study, and EGD.  Biopsy of the stomach showed mild chronic gastritis.  It is possible that this is due to heart failure.  Management as above.  For questions or updates, please contact [facility] consult [email or web address] for contact info under        Signed, TiSkeet LatchMD  10/16/2018, 10:37 AM

## 2018-10-16 NOTE — Discharge Summary (Signed)
Discharge Summary    Patient ID: Kristen Lozano MRN: [id number]; DOB: [birth year]  Admit date: 10/11/2018 Discharge date: 10/16/2018  Primary Care Provider: Audley Hose, MD  Primary Cardiologist: Pixie Casino, MD  Primary Electrophysiologist:  None   Discharge Diagnoses    Principal Problem:   Nausea and vomiting Active Problems:   Acute combined systolic and diastolic HF (heart failure) ([place])   Nonischemic cardiomyopathy ([place])   Anxiety state   Essential hypertension   Cerebral infarction ([place])   LV (left ventricular) mural thrombus following MI ([place])   Chronic combined systolic and diastolic CHF (congestive heart failure) ([place])   Uncontrolled type 2 diabetes mellitus with hyperglycemia ([facility])   Sinus tachycardia   HLD (hyperlipidemia)   GERD (gastroesophageal reflux disease)   Allergies No Known Allergies  Diagnostic Studies/Procedures    Rt and Lt cardiac cath 10/14/18  Mid RCA lesion is 15% stenosed.   No significant coronary obstructive disease with minimal 10 to 20% smooth narrowing in the mid RCA and otherwise normal LAD and left circumflex vessel.  Mild right heart pressure elevation with mild pulmonary hypertension with a PA mean pressure of 26 mm.  LVEDP 16 mm.  Recent echo Doppler with EF 20 to 25% and with patient's history of LV thrombus, left ventricular thrombus was not done.  RECOMMENDATION: Optimal guideline medical therapy for heart failure with reduced EF.  If blood pressure allows, consider Entresto.  With increased heart rate consider a ivabradine. Recommend resumption of anticoagulation with warfarin tomorrow.  _ Right Heart Pressures RA: A-wave 8, V wave 8; mean 7 RV: 37/8 PA: 41/18; mean 26 PW: A-wave 18, V wave 19; mean 17 Initial AO: 96/59; mean 74 LV: 102/16  Pullback: LV: 103/16 AO: 100/62  Oxygen saturation in the PA 60% in the AO 96% Cardiac output by the Fick method 3.2 and by the thermodilution method 2.4  L/min. Cardiac index by the Fick method 2.1 and by the thermodilution method 1.6 L/min/m  SVR: 21.2 PVR: 2.85 WU   ____________   History of Present Illness     66 YOF with hx of hypertension, diabetes mellitus type 2, history of CVA-2015, LV thrombus- on coumadin, CHF, depression, anxiety, hyperlipidemia and presented to ER 10/11/18 with intractable N&V.  She had for 2 days before ER visit.  Also with generalized abd pain.  N&V freq occurs after coughing.  She also complained of some chest pain.  She had stopped coumadin for cardiac cath planned as outpt for new decrease in EF to 20-25%.  She had just been discharged for similar symptoms on 09/25/18.  Her HR on admit was 008Q SR and diastolic BP in the 761P.   Troponin minimally elevated.  Pt admitted to [facility] and plans for further eval. With cardiac cath. Her sister is POA due to cognitive deficit since CVA.   Hospital Course     Consultants: Cardiology.   Pt's troponin levels remain flat.  But it was decided to proceed with cardiac cath once INR was sub therapeutic. For her N&V neg lipase and LFTs.  The nausea and vomiting subsided there is question if HF precipitated.   HR remained elevated and CXR with bilateral moderate pl effusions.  IV lasix was given.   With her cardiac cath see above-  Very minimal CAD, most likely decreased EF due to tachycardia. Metoprolol was increased and then ivabradine was added.  Her HR has slowed with further adjustments to BB.  N&V resolved.  PT was  recommended with pt using a walker and off balance at times.  Also RN with new medication changes.   Delene Loll would be beneficial but not sure if BP will allow and there is concern for cost.     For her diabetes her glucose was elevated but with resuming home meds  Lantus and have her follow up with PCP.     Today pt is neg 1337 total and wt is 90.8 down from 113 on admit.  HR is stable though occ to 100s.  Lopressor is 100 in AM and 50 at HS , lasix po.  For LV  thrombus she was re-started on coumadin and will have INR check in clinic and BMP on 10/20/18.  TOC appt 12.4.19  Per Dr. Oval Linsey if renal function and BP remain stable would increase losartan and add spironolactone.  She will need echo in 3 months to re-eval LVEF.   _____________  Discharge Vitals Blood pressure 101/69, pulse (!) 102, temperature 98.5 F (36.9 C), temperature source Oral, resp. rate 20, height 5\' 3"  (1.6 m), weight 45.4 kg, SpO2 99 %.  Filed Weights   10/13/18 1800 10/14/18 0444 10/16/18 0508  Weight: 48.6 kg 47.5 kg 45.4 kg    Labs & Radiologic Studies    CBC Recent Labs    10/15/18 0503  WBC 6.2  HGB 13.7  HCT 41.8  MCV 65.6*  PLT 287   Basic Metabolic Panel Recent Labs    10/15/18 0503 10/16/18 0515  NA 134* 133*  K 3.4* 3.6  CL 95* 94*  CO2 29 29  GLUCOSE 169* 199*  BUN 11 9  CREATININE 0.82 0.84  CALCIUM 9.0 9.2   Liver Function Tests No results for input(s): AST, ALT, ALKPHOS, BILITOT, PROT, ALBUMIN in the last 72 hours. No results for input(s): LIPASE, AMYLASE in the last 72 hours. Cardiac Enzymes No results for input(s): CKTOTAL, CKMB, CKMBINDEX, TROPONINI in the last 72 hours. BNP Invalid input(s): POCBNP D-Dimer No results for input(s): DDIMER in the last 72 hours. Hemoglobin A1C No results for input(s): HGBA1C in the last 72 hours. Fasting Lipid Panel No results for input(s): CHOL, HDL, LDLCALC, TRIG, CHOLHDL, LDLDIRECT in the last 72 hours. Thyroid Function Tests No results for input(s): TSH, T4TOTAL, T3FREE, THYROIDAB in the last 72 hours.  Invalid input(s): FREET3 _____________  Ct Head Wo Contrast  Result Date: 10/11/2018 CLINICAL DATA:  Altered mental status EXAM: CT HEAD WITHOUT CONTRAST TECHNIQUE: Contiguous axial images were obtained from the base of the skull through the vertex without intravenous contrast. COMPARISON:  Head CT 09/04/2014 FINDINGS: Brain: There is no mass, hemorrhage or extra-axial collection. There  is generalized atrophy without lobar predilection. There are nonacute infarcts of both parietal lobes, likely chronic, but new since 04/01/2014. There is an old left basal ganglia infarct. There is periventricular hypoattenuation compatible with chronic microvascular disease. Vascular: No abnormal hyperdensity of the major intracranial arteries or dural venous sinuses. No intracranial atherosclerosis. Skull: The visualized skull base, calvarium and extracranial soft tissues are normal. Sinuses/Orbits: No fluid levels or advanced mucosal thickening of the visualized paranasal sinuses. No mastoid or middle ear effusion. The orbits are normal. IMPRESSION: 1. No acute intracranial abnormality. 2. Multiple old infarcts. 3. Atrophy and chronic ischemic microangiopathy. Electronically Signed   By: Ulyses Jarred M.D.   On: 10/11/2018 23:55   Ct Angio Chest Pe W Or Wo Contrast  Result Date: 10/12/2018 CLINICAL DATA:  Nausea and vomiting.  Cough and runny nose. EXAM: CT ANGIOGRAPHY  CHEST CT ABDOMEN AND PELVIS WITH CONTRAST TECHNIQUE: Multidetector CT imaging of the chest was performed using the standard protocol during bolus administration of intravenous contrast. Multiplanar CT image reconstructions and MIPs were obtained to evaluate the vascular anatomy. Multidetector CT imaging of the abdomen and pelvis was performed using the standard protocol during bolus administration of intravenous contrast. CONTRAST:  159mL ISOVUE-370 IOPAMIDOL (ISOVUE-370) INJECTION 76% COMPARISON:  CT of the chest October 28, 2016 and CT the abdomen and pelvis September 01, 2018 FINDINGS: CTA CHEST FINDINGS Cardiovascular: There is mild atherosclerotic change in the thoracic aortic arch. No aneurysm or obvious dissection. Cardiomegaly. No pulmonary emboli. Mediastinum/Nodes: Moderate bilateral pleural effusions. No adenopathy or other abnormality. Lungs/Pleura: Central airways are normal. No pneumothorax. Moderate bilateral pleural effusions with  associated atelectasis. No suspicious nodules or masses. No other infiltrates. Musculoskeletal: See below Review of the MIP images confirms the above findings. CT ABDOMEN and PELVIS FINDINGS Hepatobiliary: Heterogeneous attenuation liver consistent with a not mega liver suggesting elevated right heart pressures. No suspicious mass. Limited views of the gallbladder are unremarkable. The main pulmonary vein is well opacified. Pancreas: Unremarkable. No pancreatic ductal dilatation or surrounding inflammatory changes. Spleen: Normal in size without focal abnormality. Adrenals/Urinary Tract: Evaluation limited due to motion. Regions of cortical thinning in the kidneys, likely from previous infarcts are infections. No obvious mass, hydronephrosis, or perinephric stranding. No ureteral stones noted. The bladder is unremarkable. Stomach/Bowel: The stomach and small bowel are unremarkable. The colon is unremarkable. The appendix is not well assessed but there is no secondary evidence of appendicitis. Vascular/Lymphatic: No aneurysmal dilatation or dissection noted. No adenopathy. Reproductive: Uterus and bilateral adnexa are unremarkable. Other: Increased attenuation diffusely throughout the subcutaneous fat, likely from volume overload. Musculoskeletal: No acute or significant osseous findings. Review of the MIP images confirms the above findings. IMPRESSION: 1. No pulmonary emboli. 2. Moderate bilateral pleural effusions with underlying atelectasis. 3. Atherosclerotic change in the aorta. 4. Heterogeneous nutmeg appearance to the liver, likely due to vascular congestion from elevated right heart pressures. 5. Evaluation of the remainder of the abdomen is limited due to motion. No other acute abnormalities. Electronically Signed   By: Dorise Bullion III M.D   On: 10/12/2018 00:16   Nm Hepatobiliary Liver Func  Result Date: 09/22/2018 CLINICAL DATA:  Upper abdominal pain EXAM: NUCLEAR MEDICINE HEPATOBILIARY IMAGING  VIEWS: Anterior right upper quadrant. Patient was unable to remain stationary to allow for ejection fraction assessment. RADIOPHARMACEUTICALS:  5.35 mCi Tc-81m  Choletec IV COMPARISON:  None. FINDINGS: Liver uptake of radiotracer is unremarkable. There is visualization of gallbladder and small bowel, indicating patency of the cystic and common bile ducts. Ejection fraction value not determine due to patient's inability to remains stationary and proceed with this study. IMPRESSION: Patency of the cystic and common bile ducts, evidenced by visualization of gallbladder and small bowel. Electronically Signed   By: Lowella Grip III M.D.   On: 09/22/2018 09:02   Nm Gastric Emptying  Result Date: 09/24/2018 CLINICAL DATA:  Stomach pain.  Frequent nausea and vomiting. EXAM: NUCLEAR MEDICINE GASTRIC EMPTYING SCAN TECHNIQUE: After oral ingestion of radiolabeled meal, sequential abdominal images were obtained for 4 hours. Percentage of activity emptying the stomach was calculated at 1 hour, 2 hour, 3 hour, and 4 hours. RADIOPHARMACEUTICALS:  2.09 mCi Tc-66m sulfur colloid in standardized meal; the patient ingested 2-3 bites of the prepared egg, and declined to ingest any further. COMPARISON:  CT of the abdomen and pelvis on 09/01/2018 FINDINGS: Expected location  of the stomach in the left upper quadrant. Ingested meal empties the stomach gradually over the course of the study. 36.24% emptied at 1 hr ( normal >= 10%) 75.57% emptied at 2 hr ( normal >= 40%) 86.3% emptied at 3 hr ( normal >= 70%) 91.03% emptied at 4 hr ( normal >= 90%) IMPRESSION: Normal gastric emptying study. Electronically Signed   By: Nolon Nations M.D.   On: 09/24/2018 17:18   Ct Abdomen Pelvis W Contrast  Result Date: 10/12/2018 CLINICAL DATA:  Nausea and vomiting.  Cough and runny nose. EXAM: CT ANGIOGRAPHY CHEST CT ABDOMEN AND PELVIS WITH CONTRAST TECHNIQUE: Multidetector CT imaging of the chest was performed using the standard protocol  during bolus administration of intravenous contrast. Multiplanar CT image reconstructions and MIPs were obtained to evaluate the vascular anatomy. Multidetector CT imaging of the abdomen and pelvis was performed using the standard protocol during bolus administration of intravenous contrast. CONTRAST:  175mL ISOVUE-370 IOPAMIDOL (ISOVUE-370) INJECTION 76% COMPARISON:  CT of the chest October 28, 2016 and CT the abdomen and pelvis September 01, 2018 FINDINGS: CTA CHEST FINDINGS Cardiovascular: There is mild atherosclerotic change in the thoracic aortic arch. No aneurysm or obvious dissection. Cardiomegaly. No pulmonary emboli. Mediastinum/Nodes: Moderate bilateral pleural effusions. No adenopathy or other abnormality. Lungs/Pleura: Central airways are normal. No pneumothorax. Moderate bilateral pleural effusions with associated atelectasis. No suspicious nodules or masses. No other infiltrates. Musculoskeletal: See below Review of the MIP images confirms the above findings. CT ABDOMEN and PELVIS FINDINGS Hepatobiliary: Heterogeneous attenuation liver consistent with a not mega liver suggesting elevated right heart pressures. No suspicious mass. Limited views of the gallbladder are unremarkable. The main pulmonary vein is well opacified. Pancreas: Unremarkable. No pancreatic ductal dilatation or surrounding inflammatory changes. Spleen: Normal in size without focal abnormality. Adrenals/Urinary Tract: Evaluation limited due to motion. Regions of cortical thinning in the kidneys, likely from previous infarcts are infections. No obvious mass, hydronephrosis, or perinephric stranding. No ureteral stones noted. The bladder is unremarkable. Stomach/Bowel: The stomach and small bowel are unremarkable. The colon is unremarkable. The appendix is not well assessed but there is no secondary evidence of appendicitis. Vascular/Lymphatic: No aneurysmal dilatation or dissection noted. No adenopathy. Reproductive: Uterus and bilateral  adnexa are unremarkable. Other: Increased attenuation diffusely throughout the subcutaneous fat, likely from volume overload. Musculoskeletal: No acute or significant osseous findings. Review of the MIP images confirms the above findings. IMPRESSION: 1. No pulmonary emboli. 2. Moderate bilateral pleural effusions with underlying atelectasis. 3. Atherosclerotic change in the aorta. 4. Heterogeneous nutmeg appearance to the liver, likely due to vascular congestion from elevated right heart pressures. 5. Evaluation of the remainder of the abdomen is limited due to motion. No other acute abnormalities. Electronically Signed   By: Dorise Bullion III M.D   On: 10/12/2018 00:16   US Abdomen Limited  Result Date: 09/21/2018 CLINICAL DATA:  Abdominal pain with vomiting and elevated LFTs as well as elevated bilirubin. EXAM: ULTRASOUND ABDOMEN LIMITED RIGHT UPPER QUADRANT COMPARISON:  CT 09/01/2018 FINDINGS: Gallbladder: No evidence of gallstones or sludge. Mild wall thickening measuring 5.7 mm. Possible tiny amount of pericholecystic fluid. Common bile duct: Diameter: 3.1 mm. Liver: No focal lesion identified. Within normal limits in parenchymal echogenicity. Portal vein is patent on color Doppler imaging with normal direction of blood flow towards the liver. Right pleural effusion. IMPRESSION: Nonspecific gallbladder wall thickening. No evidence of gallstones or sludge. HIDA scan may be helpful to assess gallbladder function. Normal liver and biliary tree. Right  pleural effusion. Electronically Signed   By: Marin Olp M.D.   On: 09/21/2018 22:20   Disposition   Pt is being discharged home today in good condition.  Follow-up Plans & Appointments   Low salt diabetic diet.    If you could weigh yourself daily and keep a log, if weight climbs more than 3 pounds in a day or 5 pounds in a week call Dr. Lysbeth Penner office.  Follow up with your primary doctor for your diabetes.  Your glucose was elevated here.     Call if any questions.  Bring your meds with your to the office  Call [facility] [phone] if any bleeding, swelling or drainage at cath site.  May shower, no tub baths for 48 hours for groin sticks. No lifting over 5 pounds for 3 days.  No Driving for 3 days  IF you drive  Take 1.5 pills of coumadin 5 mg  on Mondays and Fridays and 1 pill Tuesday, Wed. THursday, Saturday and Sunday   [facility] Follow up.   Why:  They will do your home health care at your home Contact information: [address] [place] [address] [phone]        Pixie Casino, MD Follow up on 10/28/2018.   Specialty:  Cardiology Why:  at 4:00PM with Almyra Deforest, PA Contact information: [place] [address] [place] Follow up on 10/20/2018.   Specialty:  Cardiology Why:  coumadin check appt.  you need other labs checked that day as well.  Important to keep due to change with coumadin.  Contact information: [address] [place] [phone]           Discharge Medications   Allergies as of 10/16/2018   No Known Allergies     Medication List    STOP taking these medications   carvedilol 12.5 MG tablet Commonly known as:  COREG   lisinopril 2.5 MG tablet Commonly known as:  PRINIVIL,ZESTRIL     TAKE these medications   ASPIRIN LOW DOSE 81 MG EC tablet Generic drug:  aspirin Take 1 tablet (81 mg total) by mouth daily. What changed:  See the new instructions.   atorvastatin 20 MG tablet Commonly known as:  LIPITOR Take 1 tablet (20 mg total) by mouth daily at 6 PM. Please schedule appointment for refills.   docusate sodium 100 MG capsule Commonly known as:  COLACE Take 1 capsule (100 mg total) by mouth 2 (two) times daily as needed for mild constipation.   feeding supplement (ENSURE ENLIVE)  Liqd Take 237 mLs by mouth 3 (three) times daily between meals.   furosemide 40 MG tablet Commonly known as:  LASIX Take 1 tablet (40 mg total) by mouth daily. Start taking on:  10/17/2018 What changed:    medication strength  how much to take   guaiFENesin 100 MG/5ML Soln Commonly known as:  ROBITUSSIN Take 15 mLs (300 mg total) by mouth 4 (four) times daily.   insulin glargine 100 UNIT/ML injection Commonly known as:  LANTUS Inject 0.1 mLs (10 Units total) into the skin at bedtime.   ivabradine 5 MG Tabs tablet Commonly known as:  CORLANOR Take 1 tablet (5 mg total) by mouth 2 (two) times daily with a meal.   losartan 25 MG tablet Commonly known as:  COZAAR  Take 0.5 tablets (12.5 mg total) by mouth daily. Start taking on:  10/17/2018   metFORMIN 500 MG 24 hr tablet Commonly known as:  GLUCOPHAGE-XR Take 2 tablets (1,000 mg total) by mouth 2 (two) times daily. Start taking on:  10/17/2018   metoprolol succinate 50 MG 24 hr tablet Commonly known as:  TOPROL-XL Take 1 tablet (50 mg total) by mouth at bedtime. Take with or immediately following a meal.   metoprolol succinate 100 MG 24 hr tablet Commonly known as:  TOPROL-XL Take 1 tablet (100 mg total) by mouth daily. Take with or immediately following a meal.  In the Morning Start taking on:  10/17/2018   MULTIVITAMIN ADULT Tabs Take 1 tablet by mouth daily.   ondansetron 4 MG tablet Commonly known as:  ZOFRAN Take 1 tablet (4 mg total) by mouth every 12 (twelve) hours.   pantoprazole 40 MG tablet Commonly known as:  PROTONIX Take 1 tablet (40 mg total) by mouth daily with supper.   polyethylene glycol packet Commonly known as:  MIRALAX / GLYCOLAX Take 17 g by mouth daily as needed.   potassium chloride 10 MEQ tablet Commonly known as:  K-DUR Take 1 tablet (10 mEq total) by mouth daily.   warfarin 5 MG tablet Commonly known as:  COUMADIN Take as directed. If you are unsure how to take this medication,  talk to your nurse or doctor. Original instructions:  Take 1 to 1.5 tablets by mouth daily as directed by coumadin clinic  Take 1.5 pills on Mondays and Fridays and 1 pill Tuesday, Wed. THursday, Saturday and Sunday What changed:  additional instructions        Acute coronary syndrome (MI, NSTEMI, STEMI, etc) this admission?: No.    Outstanding Labs/Studies   INR and BMP to be done 2018-10-22 Needs echo in 3 months for LVEF assessment  Duration of Discharge Encounter   Greater than 30 minutes including physician time.  Signed, Cecilie Kicks, NP 10/16/2018, 1:33 PM

## 2018-10-16 NOTE — Discharge Instructions (Signed)
Low salt diabetic diet.    If you could weigh yourself daily and keep a log, if weight climbs more than 3 pounds in a day or 5 pounds in a week call Dr. Lysbeth Penner office.  Follow up with your primary doctor for your diabetes.  Your glucose was elevated here.    Call if any questions.  Bring your meds with your to the office Call [facility] [phone] if any bleeding, swelling or drainage at cath site.  May shower, no tub baths for 48 hours for groin sticks. No lifting over 5 pounds for 3 days.  No Driving for 3 days  IF you drive  Take 1.5 pills of Coumadin on Mondays and Fridays and 1 pill Tuesday, Wed. THursday, Saturday and Sunday  We stopped your coreg new medications have been added.

## 2018-10-16 NOTE — Progress Notes (Signed)
Discussed Medications and follow up appointments with patient.  Sent home instructions with ex-husband and patient.

## 2018-10-16 NOTE — Progress Notes (Signed)
Physical Therapy Treatment Patient Details Name: Kristen Lozano MRN: [id number] DOB: 04-Jan-1956 Today's Date: 10/16/2018    History of Present Illness Pt is a 62 y.o. female admitted to [facility] 10/11/18 with nausea/vomiting and abdominal pain; found to be tachycardic and hypertensive. Troponin likely elevated with demand ischemia and CHF. Transfer to [facility] 10/13/18 for cardiac cath; s/p cardiac cath 11/20 which showed no significant CAD. PMH includes CVA (chronic cognitive deficits), HTN, DM, CHF.   PT Comments    Pt progressing well with mobility. Ambulatory with RW at supervision-level; pt moving well although limited by generalized fatigue. Will plan for stair training next treatment session.   Follow Up Recommendations  Home health PT;Supervision - Intermittent     Equipment Recommendations  None recommended by PT    Recommendations for Other Services       Precautions / Restrictions Precautions Precautions: Fall Restrictions Weight Bearing Restrictions: No    Mobility  Bed Mobility Overal bed mobility: Independent                Transfers Overall transfer level: Modified independent Equipment used: Rolling walker (2 wheeled) Transfers: Sit to/from Stand              Ambulation/Gait Ambulation/Gait assistance: Supervision Gait Distance (Feet): 400 Feet Assistive device: Rolling walker (2 wheeled) Gait Pattern/deviations: Step-through pattern;Decreased stride length;Trunk flexed Gait velocity: Decreased Gait velocity interpretation: 1.31 - 2.62 ft/sec, indicative of limited community ambulator General Gait Details: Supervision for balance   Stairs             Wheelchair Mobility    Modified Rankin (Stroke Patients Only)       Balance Overall balance assessment: Needs assistance Sitting-balance support: No upper extremity supported Sitting balance-Leahy Scale: Good       Standing balance-Leahy Scale: Fair Standing balance comment: Can  static stand and take steps without UE support                            Cognition Arousal/Alertness: Awake/alert Behavior During Therapy: Flat affect Overall Cognitive Status: History of cognitive impairments - at baseline Area of Impairment: Attention;Following commands;Safety/judgement;Awareness;Problem solving                   Current Attention Level: Selective   Following Commands: Follows multi-step commands with increased time Safety/Judgement: Decreased awareness of deficits;Decreased awareness of safety Awareness: Emergent Problem Solving: Slow processing;Decreased initiation;Requires verbal cues General Comments: Per chart, history of cognitive deficits since CVA. Slowed responses, delayed processing      Exercises      General Comments        Pertinent Vitals/Pain Pain Assessment: No/denies pain    Home Living                      Prior Function            PT Goals (current goals can now be found in the care plan section) Acute Rehab PT Goals Patient Stated Goal: "Go back home as soon as I can" PT Goal Formulation: With patient Time For Goal Achievement: 10/28/18 Potential to Achieve Goals: Good Progress towards PT goals: Progressing toward goals    Frequency    Min 3X/week      PT Plan Current plan remains appropriate    Co-evaluation              AM-PAC PT "6 Clicks" Daily Activity  Outcome Measure  Difficulty turning over in bed (including adjusting bedclothes, sheets and blankets)?: None Difficulty moving from lying on back to sitting on the side of the bed? : None Difficulty sitting down on and standing up from a chair with arms (e.g., wheelchair, bedside commode, etc,.)?: None Help needed moving to and from a bed to chair (including a wheelchair)?: A Little Help needed walking in hospital room?: A Little Help needed climbing 3-5 steps with a railing? : A Little 6 Click Score: 21    End of Session  Equipment Utilized During Treatment: Gait belt Activity Tolerance: Patient tolerated treatment well;Patient limited by fatigue Patient left: in bed;with call bell/phone within reach;with bed alarm set Nurse Communication: Mobility status PT Visit Diagnosis: Other abnormalities of gait and mobility (R26.89)     Time: 6122-4497 PT Time Calculation (min) (ACUTE ONLY): 15 min  Charges:  $Gait Training: 8-22 mins                    Mabeline Caras, PT, DPT Acute Rehabilitation Services  Pager [phone] Office [place] 10/16/2018, 10:52 AM

## 2018-10-16 NOTE — Telephone Encounter (Signed)
° ° °  TOC appt requested by Cecilie Kicks   Appt scheduled with [facility] 12/4

## 2018-10-16 NOTE — Progress Notes (Signed)
Pt was refusing her meds earlier, pt stated she already had her meds for tonight and she was not taking anymore.  RN explained that she had her heart med, Lantus, Robitussin, and Lovenox to still take.  Pt did not want to take any more meds and she was going to sleep.  RN explained that her heart med was very important to take due to her HR will run high at times.  Pt still refused her meds.  RN explained that they would have to call the MD and let them know.  Pt is nervous about taking pills due to her trouble swallowing, RN assured pt that the meds will be taken with applesauce and would make sure it would be easier to swallow this way.  MD would not be able change this med to IV equilvalent due to the dose.  Shortly after this her husband woke up and tried to convince her to take her meds as well, but pt insisted she already had taken her meds for the night and she does not take anymore.  Another RN helped convince her to take her meds as well and she spit them out.  RN stated that if she does not want to take them, the RN cannot force you too, but can only explain why it is important to take.  RN also explained that they could give her a PRN med if her HR ran high throughout the night.  Pt's husband called the pt's sister to try to convince pt to take her meds.  Her sister did convince her to take the meds, but even after this, she still refused, until her husband helped administer the medicine with the RN's help.  RN later found out that she takes her meds early at night and by her husband, so she had a misunderstanding about this, will continue to monitor, Thanks Buckner Malta.

## 2018-10-17 ENCOUNTER — Telehealth: Payer: Self-pay | Admitting: Physician Assistant

## 2018-10-17 NOTE — Telephone Encounter (Signed)
Patient was started on Corlanor in hospital however pharmacy is unable fill rx. Patient says "requiring insurance approval". Advised to continue other medication and call our office Monday for further instruction.   Please forwards to refill department. ? Needs prior approval.

## 2018-10-19 ENCOUNTER — Other Ambulatory Visit: Payer: Self-pay

## 2018-10-19 ENCOUNTER — Encounter (HOSPITAL_COMMUNITY): Payer: Self-pay

## 2018-10-19 ENCOUNTER — Emergency Department (HOSPITAL_COMMUNITY)
Admission: EM | Admit: 2018-10-19 | Discharge: 2018-10-19 | Disposition: A | Discharge status: Home / Self Care | Payer: Medicare HMO | Attending: Emergency Medicine | Admitting: Emergency Medicine

## 2018-10-19 DIAGNOSIS — Z9114 Patient's other noncompliance with medication regimen: Secondary | ICD-10-CM | POA: Diagnosis not present

## 2018-10-19 DIAGNOSIS — R1013 Epigastric pain: Secondary | ICD-10-CM | POA: Diagnosis not present

## 2018-10-19 DIAGNOSIS — E119 Type 2 diabetes mellitus without complications: Secondary | ICD-10-CM | POA: Diagnosis not present

## 2018-10-19 DIAGNOSIS — R630 Anorexia: Secondary | ICD-10-CM | POA: Diagnosis not present

## 2018-10-19 DIAGNOSIS — I5042 Chronic combined systolic (congestive) and diastolic (congestive) heart failure: Secondary | ICD-10-CM | POA: Insufficient documentation

## 2018-10-19 DIAGNOSIS — I110 Hypertensive heart disease with heart failure: Secondary | ICD-10-CM | POA: Insufficient documentation

## 2018-10-19 LAB — CBC WITH DIFFERENTIAL/PLATELET
ABS IMMATURE GRANULOCYTES: 0.04 10*3/uL (ref 0.00–0.07)
Basophils Absolute: 0.1 10*3/uL (ref 0.0–0.1)
Basophils Relative: 1 %
EOS PCT: 1 %
Eosinophils Absolute: 0.1 10*3/uL (ref 0.0–0.5)
HEMATOCRIT: 47.2 % — AB (ref 36.0–46.0)
HEMOGLOBIN: 14.6 g/dL (ref 12.0–15.0)
IMMATURE GRANULOCYTES: 0 %
LYMPHS PCT: 18 %
Lymphs Abs: 1.7 10*3/uL (ref 0.7–4.0)
MCH: 20.9 pg — ABNORMAL LOW (ref 26.0–34.0)
MCHC: 30.9 g/dL (ref 30.0–36.0)
MCV: 67.5 fL — AB (ref 80.0–100.0)
MONO ABS: 1.2 10*3/uL — AB (ref 0.1–1.0)
MONOS PCT: 12 %
Neutro Abs: 6.5 10*3/uL (ref 1.7–7.7)
Neutrophils Relative %: 68 %
Platelets: 222 10*3/uL (ref 150–400)
RBC: 6.99 MIL/uL — AB (ref 3.87–5.11)
RDW: 17.2 % — ABNORMAL HIGH (ref 11.5–15.5)
WBC: 9.6 10*3/uL (ref 4.0–10.5)
nRBC: 0 % (ref 0.0–0.2)

## 2018-10-19 LAB — PROTIME-INR
INR: 1.4
Prothrombin Time: 17 seconds — ABNORMAL HIGH (ref 11.4–15.2)

## 2018-10-19 LAB — COMPREHENSIVE METABOLIC PANEL
ALBUMIN: 3.6 g/dL (ref 3.5–5.0)
ALK PHOS: 45 U/L (ref 38–126)
ALT: 38 U/L (ref 0–44)
AST: 39 U/L (ref 15–41)
Anion gap: 13 (ref 5–15)
BILIRUBIN TOTAL: 1.3 mg/dL — AB (ref 0.3–1.2)
BUN: 12 mg/dL (ref 8–23)
CALCIUM: 9.5 mg/dL (ref 8.9–10.3)
CO2: 22 mmol/L (ref 22–32)
Chloride: 97 mmol/L — ABNORMAL LOW (ref 98–111)
Creatinine, Ser: 0.96 mg/dL (ref 0.44–1.00)
GFR calc non Af Amer: >60 mL/min (ref >60)
GLUCOSE: 263 mg/dL — AB (ref 70–99)
Potassium: 4 mmol/L (ref 3.5–5.1)
Sodium: 132 mmol/L — ABNORMAL LOW (ref 135–145)
TOTAL PROTEIN: 7.3 g/dL (ref 6.5–8.1)

## 2018-10-19 LAB — BRAIN NATRIURETIC PEPTIDE: B Natriuretic Peptide: 495.6 pg/mL — ABNORMAL HIGH (ref 0.0–100.0)

## 2018-10-19 LAB — TROPONIN I: Troponin I: <0.03 ng/mL (ref <0.03)

## 2018-10-19 LAB — CBG MONITORING, ED: Glucose-Capillary: 260 mg/dL — ABNORMAL HIGH (ref 70–99)

## 2018-10-19 MED ORDER — SUCRALFATE 1 GM/10ML PO SUSP: 1.0000 g | Freq: Three times a day (TID) | ORAL | 0 refills | Status: AC

## 2018-10-19 MED ORDER — ENOXAPARIN SODIUM 60 MG/0.6ML ~~LOC~~ SOLN
1.0000 mg/kg | Freq: Two times a day (BID) | SUBCUTANEOUS | Status: DC
  Administered 2018-10-19: 45 mg via SUBCUTANEOUS
  Filled 2018-10-19: qty 0.45

## 2018-10-19 NOTE — ED Triage Notes (Signed)
Pt here from home with husband at bedside for abdominal pain.  She was taking nighttime medications and did not want to take the following:   Lipitor, Lantus, metoprolol     Did not want to take them due to her belly hurting.  Husband brought patient here to have medications given here.

## 2018-10-19 NOTE — ED Provider Notes (Signed)
Emergency Department Provider Note   I have reviewed the triage vital signs and the nursing notes.   HISTORY  Chief Complaint Abdominal Pain   HPI Kristen Lozano is a 62 y.o. female with CHF (EF 20-25%), DM, Depression, HLD, and HTN presents to the emergency department for evaluation of poor appetite at home.  Patient's family member at bedside states that the patient lives at home alone but he comes over to check on her and work with her on taking her medications daily.  He states that when she is in the hospital she eats well and takes all her medications as directed but upon returning home planes more about epigastric abdominal pain.  She endorsed some epigastric abdominal pain while taking her medications today and only took 2 medicines the 5 she was supposed to take.  Patient denies any chest pain or shortness of breath.  She was discharged from the hospital 3 days ago at which time she had CT imaging of the head, chest, abdomen.  Patient denies any fevers or chills.  No UTI symptoms.  She states that she "just do not have an appetite" and occasionally with have epigastric pain. No current pain symptoms.   Past Medical History:  Diagnosis Date  . Acute combined systolic and diastolic HF (heart failure) ([place]) 10/16/2018  . Allergic rhinitis    Requires cetirizine, singulair, and fluticasone.  . Anxiety    Has been on Xanax since 2009. Uses it for stress, anxiety, and insomnia. No contract yet.  . Arthritis   . CHF (congestive heart failure) (HCC)    EF 35% after stroke, presumed ischemic  . Chronic pain    Has OA of knees B. No Xrays in echart. Not requiring narcotics.  . Colitis 12/2017  . Depression   . Diabetes mellitus    Type 2, non insulin dependent. Was dx'd prior to 2008.  Marland Kitchen Hyperglycemia   . Hyperlipemia   . Hypertension   . Sickle cell trait ([place])   . Stroke [facility]) 09/05/14   Dominant left MCA infarcts secondary to unknown embolic source     Patient Active  Problem List   Diagnosis Date Noted  . Acute combined systolic and diastolic HF (heart failure) ([place]) 10/16/2018  . HLD (hyperlipidemia) 09/21/2018  . GERD (gastroesophageal reflux disease) 09/21/2018  . Nausea and vomiting 09/08/2018  . Nausea with vomiting 09/07/2018  . Dyspnea   . Hypomagnesemia   . Sinus tachycardia   . Malnutrition of moderate degree 08/13/2018  . Enteritis   . Pleural effusion   . Nausea 08/11/2018  . Dilated cardiomyopathy ([place])   . Nausea & vomiting 08/09/2018  . Type 2 diabetes mellitus with vascular disease ([place]) 08/09/2018  . Abdominal pain 08/09/2018  . Infectious colitis 01/11/2018  . Hypokalemia 01/11/2018  . Uncontrolled type 2 diabetes mellitus with hyperglycemia ([place])   . Chronic combined systolic and diastolic CHF (congestive heart failure) ([place]) 12/16/2016  . Monitoring for Ora Bollig-term anticoagulant use 11/14/2016  . DKA (diabetic ketoacidoses) ([place]) 11/01/2016  . Diabetes mellitus 11/01/2016  . Elevated troponin   . Nonischemic cardiomyopathy ([place])   . LV (left ventricular) mural thrombus following MI ([place])   . Cognitive deficit due to recent stroke 10/18/2014  . History of completed stroke 10/18/2014  . Abnormal stress test 10/17/2014  . Dysphagia, pharyngoesophageal phase 09/23/2014  . Abnormal CT scan 09/23/2014  . Dilated bile duct 09/23/2014  . Urinary retention 09/07/2014  . 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 Stroke (  [place]) 09/05/2014  . Cerebral infarction ([place]) 09/05/2014  . Non compliance w medication regimen 08/10/2012  . Fatigue 02/18/2012  . Essential hypertension 03/22/2011  . Healthcare maintenance 03/22/2011  . Chronic pain   . ALLERGIC RHINITIS, SEASONAL 03/14/2008  . SICKLE CELL TRAIT 10/31/2006  . Anxiety state 10/31/2006  . DEPRESSION 10/31/2006    Past Surgical History:  Procedure Laterality Date  . BIOPSY  09/23/2018   Procedure: BIOPSY;  Surgeon: Yetta Flock, MD;  Location: [facility];   Service: Gastroenterology;;  . ESOPHAGOGASTRODUODENOSCOPY (EGD) WITH PROPOFOL N/A 09/23/2018   Procedure: ESOPHAGOGASTRODUODENOSCOPY (EGD) WITH PROPOFOL;  Surgeon: Yetta Flock, MD;  Location: [facility];  Service: Gastroenterology;  Laterality: N/A;  . RIGHT/LEFT HEART CATH AND CORONARY ANGIOGRAPHY N/A 10/14/2018   Procedure: RIGHT/LEFT HEART CATH AND CORONARY ANGIOGRAPHY;  Surgeon: Troy Sine, MD;  Location: [facility];  Service: Cardiovascular;  Laterality: N/A;  . TEE WITHOUT CARDIOVERSION N/A 09/06/2014   Procedure: TRANSESOPHAGEAL ECHOCARDIOGRAM (TEE);  Surgeon: Pixie Casino, MD;  Location: [facility] ENDOSCOPY;  Service: Cardiovascular;  Laterality: N/A;    Allergies Patient has no known allergies.  Family History  Problem Relation Age of Onset  . Diabetes Mother   . Hypertension Mother   . Heart disease Father   . Heart attack Father   . Hypertension Father   . Diabetes Father   . Ovarian cancer Sister   . Liver cancer Sister   . Sickle cell anemia Daughter   . Schizophrenia Daughter   . Hypertension Sister   . Hypertension Brother   . Hypertension Daughter   . Kidney disease Sister        x2  . Kidney disease Brother   . Stroke Neg Hx   . Esophageal cancer Neg Hx   . Colon cancer Neg Hx   . Colon polyps Neg Hx     Social History Social History   Tobacco Use  . Smoking status: Never Smoker  . Smokeless tobacco: Never Used  Substance Use Topics  . Alcohol use: No    Alcohol/week: 0.0 standard drinks  . Drug use: No    Review of Systems  Constitutional: No fever/chills Eyes: No visual changes. ENT: No sore throat. Cardiovascular: Denies chest pain. Respiratory: Denies shortness of breath. Gastrointestinal: Positive epigastric abdominal pain.  No nausea, no vomiting.  No diarrhea.  No constipation. Genitourinary: Negative for dysuria. Musculoskeletal: Negative for back pain. Skin: Negative for rash. Neurological: Negative for  headaches, focal weakness or numbness.  10-point ROS otherwise negative.  ____________________________________________   PHYSICAL EXAM:  VITAL SIGNS: ED Triage Vitals  Enc Vitals Group     BP 10/19/18 2003 113/77     Pulse Rate 10/19/18 2003 93     Resp 10/19/18 2003 17     Temp 10/19/18 2003 97.6 F (36.4 C)     Temp Source 10/19/18 2003 Oral     SpO2 10/19/18 2003 94 %     Weight 10/19/18 2006 103 lb (46.7 kg)     Height 10/19/18 2006 5' 3"  (1.6 m)     Pain Score 10/19/18 2016 0   Constitutional: Alert and oriented. Well appearing and in no acute distress. Eyes: Conjunctivae are normal.  Head: Atraumatic. Nose: No congestion/rhinnorhea. Mouth/Throat: Mucous membranes are moist.  Oropharynx non-erythematous. Neck: No stridor.   Cardiovascular: Normal rate, regular rhythm. Good peripheral circulation. Grossly normal heart sounds.   Respiratory: Normal respiratory effort.  No retractions. Lungs CTAB. Gastrointestinal: Soft and nontender. No  distention.  Musculoskeletal: No lower extremity tenderness nor edema. No gross deformities of extremities. Neurologic:  Normal speech and language. No gross focal neurologic deficits are appreciated.  Skin:  Skin is warm, dry and intact. No rash noted.  ____________________________________________   LABS (all labs ordered are listed, but only abnormal results are displayed)  Labs Reviewed  COMPREHENSIVE METABOLIC PANEL - Abnormal; Notable for the following components:      Result Value   Sodium 132 (*)    Chloride 97 (*)    Glucose, Bld 263 (*)    Total Bilirubin 1.3 (*)    All other components within normal limits  BRAIN NATRIURETIC PEPTIDE - Abnormal; Notable for the following components:   B Natriuretic Peptide 495.6 (*)    All other components within normal limits  CBC WITH DIFFERENTIAL/PLATELET - Abnormal; Notable for the following components:   RBC 6.99 (*)    HCT 47.2 (*)    MCV 67.5 (*)    MCH 20.9 (*)    RDW 17.2  (*)    Monocytes Absolute 1.2 (*)    All other components within normal limits  PROTIME-INR - Abnormal; Notable for the following components:   Prothrombin Time 17.0 (*)    All other components within normal limits  CBG MONITORING, ED - Abnormal; Notable for the following components:   Glucose-Capillary 260 (*)    All other components within normal limits  TROPONIN I   ____________________________________________  EKG   EKG Interpretation  Date/Time:  Monday October 19 2018 20:46:23 EST Ventricular Rate:  95 PR Interval:    QRS Duration: 100 QT Interval:  340 QTC Calculation: 428 R Axis:   35 Text Interpretation:  Sinus rhythm Biatrial enlargement LVH with secondary repolarization abnormality No STEMI.  Confirmed by Nanda Quinton [phone]) on 10/19/2018 8:49:12 PM Also confirmed by Nanda Quinton [phone]), editor Hattie Perch ([id number])  on 10/20/2018 7:03:31 AM       ____________________________________________  RADIOLOGY  None ____________________________________________   PROCEDURES  Procedure(s) performed:   Procedures  None ____________________________________________   INITIAL IMPRESSION / ASSESSMENT AND PLAN / ED COURSE  Pertinent labs & imaging results that were available during my care of the patient were reviewed by me and considered in my medical decision making (see chart for details).  Patient presents to the emergency department for evaluation of epigastric abdominal pain with poor medication compliance as a result.  She reports not having an appetite along with the pain.  No nausea or vomiting.  Abdomen is completely soft and nontender to palpation.  Normal.  No hypoxemia, wheezing, rales.  Patient had CT imaging of the chest and abdomen during her recent admission.  I do not feel that repeat imaging is required at this time. Plan for screening labs including INR along with reassessment.  Labs reviewed. INR is subtherapeutic. Will given Lovenox  here. Patient without pain at this time. No evidence to suspect atypical ACS. Symptoms are more chronic in nature. Will start Carafate and patient/family will discuss poor appetite with PCP. Plan for discharge. ED return precautions discussed in detail.  ____________________________________________  FINAL CLINICAL IMPRESSION(S) / ED DIAGNOSES  Final diagnoses:  Epigastric abdominal pain    NEW OUTPATIENT MEDICATIONS STARTED DURING THIS VISIT:  Discharge Medication List as of 10/19/2018 10:36 PM    START taking these medications   Details  sucralfate (CARAFATE) 1 GM/10ML suspension Take 10 mLs (1 g total) by mouth 4 (four) times daily -  with meals and at bedtime for  10 days., Starting Mon 10/19/2018, Until Thu 10/29/2018, Print        Note:  This document was prepared using Dragon voice recognition software and may include unintentional dictation errors.  Nanda Quinton, MD Emergency Medicine    Jalin Alicea, [facility], MD 10/20/18 [phone]

## 2018-10-19 NOTE — Telephone Encounter (Signed)
PA for corlanor submitted via [email or web address]  KeyMardee Postin - PA Case ID: [id number]

## 2018-10-19 NOTE — ED Notes (Signed)
Patient Alert and oriented to baseline. Stable and ambulatory to baseline. Patient verbalized understanding of the discharge instructions.  Patient belongings were taken by the patient.   

## 2018-10-19 NOTE — Telephone Encounter (Signed)
Denied today  The benefit provides coverage for the requested drug when medically necessary. However, the information submitted doesnt meet [facility] medical necessity guidelines for coverage. The member must meet the following criteria: doesnt have acute decompensated heart failure. This determination was based on the [employer] Corlanor (ivabradine) Coverage Policy.  Questions answered:  Diagnosis: cardiomyopathy; chronic combined systolic & diastolic heart failure; tachycardia ICD-10: I42.9; I50.42; R00.0 Does the patient have sick sinus syndrome, sinoatrial block or third degree atrioventricular block? NO Is the patients heart rate maintained exclusively by a pacemaker? NO Does the patient have any of the following: [place] all that apply) severe hepatic impairment (NO), acute decompensated HF (YES) Class III NYHA symptoms Sinus Rhythm: YES EF equal to/less than 35%: YES HR greater than/equal to 70 bpm: YES BP greater than/equal to 90/50: YES Has the patient had previous treatment, intolerance or contraindication to maximally tolerated doses of at least one beta-blocker (e.g. carvedilol 35m daily, metoprolol 2037mdaily or bisoprolol 1067maily)? YES

## 2018-10-19 NOTE — Telephone Encounter (Signed)
PT'S PHONE STILL STATES IS NOT ACCEPTING PHONE CALLS UNABLE TO [place] ./CY WILL TRY LATER

## 2018-10-19 NOTE — Telephone Encounter (Signed)
Attempted to contact patient, number is not in accepting new calls at this time.  Will try again.

## 2018-10-19 NOTE — Discharge Instructions (Signed)

## 2018-10-19 NOTE — Progress Notes (Signed)
ANTICOAGULATION CONSULT NOTE - Initial Consult  Pharmacy Consult for lovenox Indication: mural thrombus  No Known Allergies  Patient Measurements: Height: 5\' 3"  (160 cm) Weight: 103 lb (46.7 kg) IBW/kg (Calculated) : 52.4 Heparin Dosing Weight:   Vital Signs: Temp: 97.6 F (36.4 C) (11/25 2003) Temp Source: Oral (11/25 2003) BP: 121/85 (11/25 2030) Pulse Rate: 93 (11/25 2030)  Labs: Recent Labs    10/19/18 2051  HGB 14.6  HCT 47.2*  PLT 222  LABPROT 17.0*  INR 1.40  CREATININE 0.96  TROPONINI <0.03    Estimated Creatinine Clearance: 44.2 mL/min (by C-G formula based on SCr of 0.96 mg/dL).   Medical History: Past Medical History:  Diagnosis Date  . Acute combined systolic and diastolic HF (heart failure) ([place]) 10/16/2018  . Allergic rhinitis    Requires cetirizine, singulair, and fluticasone.  . Anxiety    Has been on Xanax since 2009. Uses it for stress, anxiety, and insomnia. No contract yet.  . Arthritis   . CHF (congestive heart failure) (HCC)    EF 35% after stroke, presumed ischemic  . Chronic pain    Has OA of knees B. No Xrays in echart. Not requiring narcotics.  . Colitis 12/2017  . Depression   . Diabetes mellitus    Type 2, non insulin dependent. Was dx'd prior to 2008.  Marland Kitchen Hyperglycemia   . Hyperlipemia   . Hypertension   . Sickle cell trait ([place])   . Stroke [facility]) 09/05/14   Dominant left MCA infarcts secondary to unknown embolic source    Assessment: 20 yof presented to the ED with abdominal pain. She is on chronic warfarin for history of mural thrombus. INR is subtherapeutic. To start therapeutic lovenox. Baseline CBC is WNL and no bleeding noted.   Goal of Therapy:  Anti-Xa level 0.6-1 units/ml 4hrs after LMWH dose given Monitor platelets by anticoagulation protocol: Yes   Plan:  Lovenox 1mg /kg SQ Q12H F/u renal fxn, S&S of bleeding F/u warfarin plans  Nautica Hotz, Rande Lawman 10/19/2018,10:43 PM

## 2018-10-20 NOTE — Telephone Encounter (Signed)
Attempt to call patient, # listed is not accepting calls at this time.   TOC attempt #3, will close encounter.

## 2018-10-20 NOTE — Telephone Encounter (Signed)
Patient has f/up with Isaac Laud PA on 10/28/18

## 2018-10-20 NOTE — Telephone Encounter (Signed)
Will need to see in follow-up to discuss treatment further with Corlanor.  Dr Lemmie Evens

## 2018-10-25 ENCOUNTER — Encounter (HOSPITAL_COMMUNITY): Payer: Self-pay

## 2018-10-25 ENCOUNTER — Other Ambulatory Visit: Payer: Self-pay

## 2018-10-25 ENCOUNTER — Emergency Department (HOSPITAL_COMMUNITY): Payer: Medicare HMO

## 2018-10-25 ENCOUNTER — Observation Stay (HOSPITAL_COMMUNITY)
Admission: EM | Admit: 2018-10-25 | Discharge: 2018-10-28 | Disposition: A | Discharge status: Home / Self Care | Payer: Medicare HMO | Attending: Internal Medicine | Admitting: Internal Medicine

## 2018-10-25 DIAGNOSIS — I5041 Acute combined systolic (congestive) and diastolic (congestive) heart failure: Secondary | ICD-10-CM | POA: Insufficient documentation

## 2018-10-25 DIAGNOSIS — I69319 Unspecified symptoms and signs involving cognitive functions following cerebral infarction: Secondary | ICD-10-CM

## 2018-10-25 DIAGNOSIS — E1151 Type 2 diabetes mellitus with diabetic peripheral angiopathy without gangrene: Secondary | ICD-10-CM | POA: Diagnosis not present

## 2018-10-25 DIAGNOSIS — R531 Weakness: Secondary | ICD-10-CM | POA: Diagnosis not present

## 2018-10-25 DIAGNOSIS — IMO0002 Reserved for concepts with insufficient information to code with codable children: Secondary | ICD-10-CM | POA: Diagnosis present

## 2018-10-25 DIAGNOSIS — I639 Cerebral infarction, unspecified: Secondary | ICD-10-CM | POA: Diagnosis not present

## 2018-10-25 DIAGNOSIS — I959 Hypotension, unspecified: Secondary | ICD-10-CM

## 2018-10-25 DIAGNOSIS — R1084 Generalized abdominal pain: Secondary | ICD-10-CM

## 2018-10-25 DIAGNOSIS — I5020 Unspecified systolic (congestive) heart failure: Secondary | ICD-10-CM | POA: Diagnosis present

## 2018-10-25 DIAGNOSIS — E1110 Type 2 diabetes mellitus with ketoacidosis without coma: Secondary | ICD-10-CM | POA: Insufficient documentation

## 2018-10-25 DIAGNOSIS — R110 Nausea: Secondary | ICD-10-CM

## 2018-10-25 DIAGNOSIS — I10 Essential (primary) hypertension: Secondary | ICD-10-CM | POA: Diagnosis present

## 2018-10-25 DIAGNOSIS — R5383 Other fatigue: Secondary | ICD-10-CM | POA: Diagnosis present

## 2018-10-25 DIAGNOSIS — I110 Hypertensive heart disease with heart failure: Secondary | ICD-10-CM | POA: Insufficient documentation

## 2018-10-25 DIAGNOSIS — E860 Dehydration: Secondary | ICD-10-CM

## 2018-10-25 DIAGNOSIS — E1165 Type 2 diabetes mellitus with hyperglycemia: Secondary | ICD-10-CM | POA: Diagnosis not present

## 2018-10-25 LAB — COMPREHENSIVE METABOLIC PANEL
ALBUMIN: 4.3 g/dL (ref 3.5–5.0)
ALK PHOS: 73 U/L (ref 38–126)
ALT: 61 U/L — AB (ref 0–44)
AST: 50 U/L — ABNORMAL HIGH (ref 15–41)
Anion gap: 15 (ref 5–15)
BILIRUBIN TOTAL: 1.3 mg/dL — AB (ref 0.3–1.2)
BUN: 23 mg/dL (ref 8–23)
CALCIUM: 9.8 mg/dL (ref 8.9–10.3)
CO2: 28 mmol/L (ref 22–32)
CREATININE: 0.91 mg/dL (ref 0.44–1.00)
Chloride: 91 mmol/L — ABNORMAL LOW (ref 98–111)
GFR calc Af Amer: >60 mL/min (ref >60)
GFR calc non Af Amer: >60 mL/min (ref >60)
GLUCOSE: 158 mg/dL — AB (ref 70–99)
Potassium: 3.6 mmol/L (ref 3.5–5.1)
SODIUM: 134 mmol/L — AB (ref 135–145)
TOTAL PROTEIN: 8.7 g/dL — AB (ref 6.5–8.1)

## 2018-10-25 LAB — CBC WITH DIFFERENTIAL/PLATELET
Abs Immature Granulocytes: 0.02 10*3/uL (ref 0.00–0.07)
Basophils Absolute: 0.1 10*3/uL (ref 0.0–0.1)
Basophils Relative: 1 %
Eosinophils Absolute: 0.1 10*3/uL (ref 0.0–0.5)
Eosinophils Relative: 1 %
HCT: 51.4 % — ABNORMAL HIGH (ref 36.0–46.0)
Hemoglobin: 16.4 g/dL — ABNORMAL HIGH (ref 12.0–15.0)
Immature Granulocytes: 0 %
Lymphocytes Relative: 22 %
Lymphs Abs: 1.8 10*3/uL (ref 0.7–4.0)
MCH: 21.1 pg — ABNORMAL LOW (ref 26.0–34.0)
MCHC: 31.9 g/dL (ref 30.0–36.0)
MCV: 66 fL — ABNORMAL LOW (ref 80.0–100.0)
Monocytes Absolute: 0.9 10*3/uL (ref 0.1–1.0)
Monocytes Relative: 11 %
Neutro Abs: 5.2 10*3/uL (ref 1.7–7.7)
Neutrophils Relative %: 65 %
Platelets: 338 10*3/uL (ref 150–400)
RBC: 7.79 MIL/uL — ABNORMAL HIGH (ref 3.87–5.11)
RDW: 17.8 % — AB (ref 11.5–15.5)
WBC: 8 10*3/uL (ref 4.0–10.5)
nRBC: 0 % (ref 0.0–0.2)

## 2018-10-25 LAB — ETHANOL: Alcohol, Ethyl (B): <10 mg/dL (ref <10)

## 2018-10-25 LAB — URINALYSIS, ROUTINE W REFLEX MICROSCOPIC
BACTERIA UA: NONE SEEN
BILIRUBIN URINE: NEGATIVE
GLUCOSE, UA: 50 mg/dL — AB
HGB URINE DIPSTICK: NEGATIVE
Ketones, ur: 20 mg/dL — AB
LEUKOCYTES UA: NEGATIVE
Nitrite: NEGATIVE
Protein, ur: 30 mg/dL — AB
Specific Gravity, Urine: 1.02 (ref 1.005–1.030)
pH: 5 (ref 5.0–8.0)

## 2018-10-25 LAB — I-STAT TROPONIN, ED: Troponin i, poc: 0.04 ng/mL (ref 0.00–0.08)

## 2018-10-25 LAB — GLUCOSE, CAPILLARY
Glucose-Capillary: 165 mg/dL — ABNORMAL HIGH (ref 70–99)
Glucose-Capillary: 203 mg/dL — ABNORMAL HIGH (ref 70–99)

## 2018-10-25 LAB — I-STAT CG4 LACTIC ACID, ED: Lactic Acid, Venous: 2.21 mmol/L (ref 0.5–1.9)

## 2018-10-25 LAB — BRAIN NATRIURETIC PEPTIDE: B Natriuretic Peptide: 267.6 pg/mL — ABNORMAL HIGH (ref 0.0–100.0)

## 2018-10-25 LAB — PROTIME-INR
INR: 2.42
Prothrombin Time: 26 seconds — ABNORMAL HIGH (ref 11.4–15.2)

## 2018-10-25 MED ORDER — METFORMIN HCL ER 500 MG PO TB24
1000.0000 mg | ORAL_TABLET | Freq: Two times a day (BID) | ORAL | Status: DC
  Administered 2018-10-25 – 2018-10-26 (×2): 1000 mg via ORAL
  Filled 2018-10-25 (×3): qty 2

## 2018-10-25 MED ORDER — ASPIRIN EC 81 MG PO TBEC
81.0000 mg | DELAYED_RELEASE_TABLET | Freq: Every day | ORAL | Status: DC
  Administered 2018-10-25 – 2018-10-28 (×4): 81 mg via ORAL
  Filled 2018-10-25 (×4): qty 1

## 2018-10-25 MED ORDER — WARFARIN SODIUM 5 MG PO TABS: 7.5000 mg | ORAL_TABLET | ORAL | Status: DC

## 2018-10-25 MED ORDER — INSULIN ASPART 100 UNIT/ML ~~LOC~~ SOLN
0.0000 [IU] | Freq: Every day | SUBCUTANEOUS | Status: DC
  Administered 2018-10-25: 2 [IU] via SUBCUTANEOUS
  Administered 2018-10-26: 3 [IU] via SUBCUTANEOUS

## 2018-10-25 MED ORDER — SUCRALFATE 1 GM/10ML PO SUSP
1.0000 g | Freq: Three times a day (TID) | ORAL | Status: DC
  Administered 2018-10-25 (×2): 1 g via ORAL
  Filled 2018-10-25 (×3): qty 10

## 2018-10-25 MED ORDER — POTASSIUM CHLORIDE CRYS ER 10 MEQ PO TBCR
10.0000 meq | EXTENDED_RELEASE_TABLET | Freq: Every day | ORAL | Status: DC
  Administered 2018-10-25 – 2018-10-28 (×4): 10 meq via ORAL
  Filled 2018-10-25 (×4): qty 1

## 2018-10-25 MED ORDER — BISACODYL 10 MG RE SUPP
10.0000 mg | Freq: Once | RECTAL | Status: DC
  Filled 2018-10-25: qty 1

## 2018-10-25 MED ORDER — HALOPERIDOL LACTATE 5 MG/ML IJ SOLN
5.0000 mg | Freq: Once | INTRAMUSCULAR | Status: AC
  Administered 2018-10-25: 5 mg via INTRAVENOUS
  Filled 2018-10-25: qty 1

## 2018-10-25 MED ORDER — WARFARIN - PHYSICIAN DOSING INPATIENT: Freq: Every day | Status: DC

## 2018-10-25 MED ORDER — INSULIN GLARGINE 100 UNIT/ML ~~LOC~~ SOLN
10.0000 [IU] | Freq: Every day | SUBCUTANEOUS | Status: DC
  Administered 2018-10-25 – 2018-10-27 (×3): 10 [IU] via SUBCUTANEOUS
  Filled 2018-10-25 (×3): qty 0.1

## 2018-10-25 MED ORDER — SODIUM CHLORIDE 0.9 % IV BOLUS
500.0000 mL | Freq: Once | INTRAVENOUS | Status: AC
  Administered 2018-10-25: 500 mL via INTRAVENOUS

## 2018-10-25 MED ORDER — ATORVASTATIN CALCIUM 20 MG PO TABS
20.0000 mg | ORAL_TABLET | Freq: Every day | ORAL | Status: DC
  Administered 2018-10-25 – 2018-10-27 (×3): 20 mg via ORAL
  Filled 2018-10-25 (×3): qty 1

## 2018-10-25 MED ORDER — WARFARIN SODIUM 5 MG PO TABS: 5.0000 mg | ORAL_TABLET | ORAL | Status: DC

## 2018-10-25 MED ORDER — MORPHINE SULFATE (PF) 2 MG/ML IV SOLN: 2.0000 mg | INTRAVENOUS | Status: DC | PRN

## 2018-10-25 MED ORDER — INSULIN ASPART 100 UNIT/ML ~~LOC~~ SOLN
0.0000 [IU] | Freq: Three times a day (TID) | SUBCUTANEOUS | Status: DC
  Administered 2018-10-25 – 2018-10-26 (×2): 2 [IU] via SUBCUTANEOUS
  Administered 2018-10-26: 5 [IU] via SUBCUTANEOUS
  Administered 2018-10-27 (×2): 3 [IU] via SUBCUTANEOUS
  Administered 2018-10-28: 5 [IU] via SUBCUTANEOUS

## 2018-10-25 MED ORDER — SODIUM CHLORIDE 0.9 % IV SOLN
INTRAVENOUS | Status: DC
  Administered 2018-10-25 – 2018-10-27 (×3): via INTRAVENOUS

## 2018-10-25 MED ORDER — LACTULOSE 10 GM/15ML PO SOLN
30.0000 g | Freq: Once | ORAL | Status: DC
  Filled 2018-10-25: qty 45

## 2018-10-25 MED ORDER — PANTOPRAZOLE SODIUM 40 MG PO TBEC
40.0000 mg | DELAYED_RELEASE_TABLET | Freq: Every day | ORAL | Status: DC
  Administered 2018-10-25 – 2018-10-27 (×3): 40 mg via ORAL
  Filled 2018-10-25 (×3): qty 1

## 2018-10-25 MED ORDER — ONDANSETRON HCL 4 MG PO TABS
4.0000 mg | ORAL_TABLET | Freq: Two times a day (BID) | ORAL | Status: DC
  Administered 2018-10-25 – 2018-10-28 (×4): 4 mg via ORAL
  Filled 2018-10-25 (×6): qty 1

## 2018-10-25 MED ORDER — ENSURE ENLIVE PO LIQD
237.0000 mL | Freq: Three times a day (TID) | ORAL | Status: DC
  Administered 2018-10-25 – 2018-10-28 (×5): 237 mL via ORAL

## 2018-10-25 NOTE — ED Notes (Signed)
Husband is at bedside with patient.

## 2018-10-25 NOTE — ED Notes (Signed)
ED TO INPATIENT HANDOFF REPORT  Name/Age/Gender Kristen Lozano 62 y.o. female  Code Status Code Status History    Date Active Date Inactive Code Status Order ID Comments User Context   10/11/2018 1958 10/16/2018 2129 Full Code [id number]  Alma Friendly, MD Inpatient   09/22/2018 0004 09/25/2018 2122 Full Code [id number]  Ivor Costa, MD ED   09/08/2018 0206 09/09/2018 2157 Full Code [id number]  Bennie Pierini, MD Inpatient   09/01/2018 1746 09/02/2018 1749 Full Code [id number]  Cristy Folks, MD ED   08/09/2018 2140 08/17/2018 2106 Full Code [id number]  Rise Patience, MD ED   01/11/2018 0125 01/15/2018 1528 Full Code [id number]  Vianne Bulls, MD ED   10/29/2016 0010 11/01/2016 1654 Full Code [id number]  Edwin Dada, MD Inpatient   09/05/2014 0221 09/10/2014 1635 Full Code [id number]  Berle Mull, MD Inpatient      Home/SNF/Other Home  Chief Complaint failure to thrive  Level of Care/Admitting Diagnosis ED Disposition    ED Disposition Condition [place]: [facility] [[id number]]  Level of Care: Med-Surg [16]  Diagnosis: General weakness [915056]  Admitting Physician: Cristal Deer [[id number]]  Attending Physician: Cristal Deer [[id number]]  PT Class (Do Not Modify): Observation [104]  PT Acc Code (Do Not Modify): Observation [10022]       Medical History Past Medical History:  Diagnosis Date  . 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 Allergic rhinitis    Requires cetirizine, singulair, and fluticasone.  . Anxiety    Has been on Xanax since 2009. Uses it for stress, anxiety, and insomnia. No contract yet.  . Arthritis   . CHF (congestive heart failure) (HCC)    EF 35% after stroke, presumed ischemic  . Chronic pain    Has OA of knees B. No Xrays in echart. Not requiring narcotics.  . Colitis 12/2017  . Depression   . Diabetes mellitus    Type 2, non insulin dependent.  Was dx'd prior to 2008.  Marland Kitchen Hyperglycemia   . Hyperlipemia   . Hypertension   . Sickle cell trait ([place])   . Stroke ([place]) 09/05/14   Dominant left MCA infarcts secondary to unknown embolic source     Allergies No Known Allergies  IV Location/Drains/Wounds Patient Lines/Drains/Airways Status   Active Line/Drains/Airways    Name:   Placement date:   Placement time:   Site:   Days:   Peripheral IV 10/25/18 Left;Upper Arm   10/25/18    1048    Arm   less than 1          Labs/Imaging Results for orders placed or performed during the hospital encounter of 10/25/18 (from the past 48 hour(s))  Comprehensive metabolic panel     Status: Abnormal   Collection Time: 10/25/18 10:53 AM  Result Value Ref Range   Sodium 134 (L) 135 - 145 mmol/L   Potassium 3.6 3.5 - 5.1 mmol/L   Chloride 91 (L) 98 - 111 mmol/L   CO2 28 22 - 32 mmol/L   Glucose, Bld 158 (H) 70 - 99 mg/dL   BUN 23 8 - 23 mg/dL   Creatinine, Ser 0.91 0.44 - 1.00 mg/dL   Calcium 9.8 8.9 - 10.3 mg/dL   Total Protein 8.7 (H) 6.5 - 8.1 g/dL   Albumin 4.3 3.5 - 5.0 g/dL   AST 50 (H) 15 - 41 U/L   ALT 61 (H) 0 -  44 U/L   Alkaline Phosphatase 73 38 - 126 U/L   Total Bilirubin 1.3 (H) 0.3 - 1.2 mg/dL   GFR calc non Af Amer >60 >60 mL/min   GFR calc Af Amer >60 >60 mL/min   Anion gap 15 5 - 15    Comment: Performed at [facility], [place] [address] [place], [place] [address]  Ethanol     Status: None   Collection Time: 10/25/18 10:53 AM  Result Value Ref Range   Alcohol, Ethyl (B) <10 <10 mg/dL    Comment: (NOTE) Lowest detectable limit for serum alcohol is 10 mg/dL. For medical purposes only. Performed at [facility], [place] [address], [place], [place] [address]   Brain natriuretic peptide     Status: Abnormal   Collection Time: 10/25/18 10:53 AM  Result Value Ref Range   B Natriuretic Peptide 267.6 (H) 0.0 - 100.0 pg/mL    Comment: Performed at [facility], [place]  [address], [place], [place] [address]  CBC with Differential     Status: Abnormal   Collection Time: 10/25/18 10:53 AM  Result Value Ref Range   WBC 8.0 4.0 - 10.5 K/uL   RBC 7.79 (H) 3.87 - 5.11 MIL/uL   Hemoglobin 16.4 (H) 12.0 - 15.0 g/dL   HCT 51.4 (H) 36.0 - 46.0 %   MCV 66.0 (L) 80.0 - 100.0 fL   MCH 21.1 (L) 26.0 - 34.0 pg   MCHC 31.9 30.0 - 36.0 g/dL   RDW 17.8 (H) 11.5 - 15.5 %   Platelets 338 150 - 400 K/uL   nRBC 0.0 0.0 - 0.2 %   Neutrophils Relative % 65 %   Neutro Abs 5.2 1.7 - 7.7 K/uL   Lymphocytes Relative 22 %   Lymphs Abs 1.8 0.7 - 4.0 K/uL   Monocytes Relative 11 %   Monocytes Absolute 0.9 0.1 - 1.0 K/uL   Eosinophils Relative 1 %   Eosinophils Absolute 0.1 0.0 - 0.5 K/uL   Basophils Relative 1 %   Basophils Absolute 0.1 0.0 - 0.1 K/uL   Immature Granulocytes 0 %   Abs Immature Granulocytes 0.02 0.00 - 0.07 K/uL    Comment: Performed at [facility], [place] [address], [place], [place] [address]  Urinalysis, Routine w reflex microscopic     Status: Abnormal   Collection Time: 10/25/18 10:53 AM  Result Value Ref Range   Color, Urine YELLOW YELLOW   APPearance CLEAR CLEAR   Specific Gravity, Urine 1.020 1.005 - 1.030   pH 5.0 5.0 - 8.0   Glucose, UA 50 (A) NEGATIVE mg/dL   Hgb urine dipstick NEGATIVE NEGATIVE   Bilirubin Urine NEGATIVE NEGATIVE   Ketones, ur 20 (A) NEGATIVE mg/dL   Protein, ur 30 (A) NEGATIVE mg/dL   Nitrite NEGATIVE NEGATIVE   Leukocytes, UA NEGATIVE NEGATIVE   RBC / HPF 6-10 0 - 5 RBC/hpf   WBC, UA 0-5 0 - 5 WBC/hpf   Bacteria, UA NONE SEEN NONE SEEN   Squamous Epithelial / LPF 0-5 0 - 5   Mucus PRESENT    Hyaline Casts, UA PRESENT     Comment: Performed at [facility], [place] [address], [place], [place] [address]  Protime-INR     Status: Abnormal   Collection Time: 10/25/18 10:53 AM  Result Value Ref Range   Prothrombin Time 26.0 (H) 11.4 - 15.2 seconds   INR 2.42     Comment: Performed at  [facility], [place] [address], [place], [place] [address]  I-stat troponin, ED     Status: None   Collection Time: 10/25/18 11:05 AM  Result Value Ref Range   Troponin i, poc 0.04 0.00 - 0.08 ng/mL   Comment 3            Comment: Due to the release kinetics of cTnI, a negative result within the first hours of the onset of symptoms does not rule out myocardial infarction with certainty. If myocardial infarction is still suspected, repeat the test at appropriate intervals.   I-Stat CG4 Lactic Acid, ED     Status: Abnormal   Collection Time: 10/25/18 11:07 AM  Result Value Ref Range   Lactic Acid, Venous 2.21 (HH) 0.5 - 1.9 mmol/L   Comment NOTIFIED PHYSICIAN    Ct Head Wo Contrast  Result Date: 10/25/2018 CLINICAL DATA:  Altered level of consciousness. EXAM: CT HEAD WITHOUT CONTRAST TECHNIQUE: Contiguous axial images were obtained from the base of the skull through the vertex without intravenous contrast. COMPARISON:  10/11/2018. FINDINGS: Brain: No evidence of acute infarction, hemorrhage, hydrocephalus, extra-axial collection or mass lesion/mass effect. There is mild diffuse low-attenuation within the subcortical and periventricular white matter compatible with chronic microvascular disease. Chronic left basal ganglia infarct with ex vacuo dilatation of the anterior horn of left lateral ventricle is again noted. Additional chronic infarcts involve the left parietal lobe, left occipital lobe, and right posterior parietal lobe. Vascular: No hyperdense vessel or unexpected calcification. Skull: Normal. Negative for fracture or focal lesion. Sinuses/Orbits: No acute finding. Other: None. IMPRESSION: 1. No acute intracranial abnormality. 2. Chronic small vessel ischemic disease with chronic bilateral parietal, left occipital and left basal ganglia infarcts. Electronically Signed   By: Kerby Moors M.D.   On: 10/25/2018 10:48   Dg Abdomen Acute W/chest  Result Date:  10/25/2018 CLINICAL DATA:  Vomiting EXAM: DG ABDOMEN ACUTE W/ 1V CHEST COMPARISON:  10/11/2018 CT abdomen/pelvis. 09/01/2018 chest radiograph. FINDINGS: Stable cardiomediastinal silhouette with mild cardiomegaly. No pneumothorax. No pleural effusion. Lungs appear clear, with no acute consolidative airspace disease and no pulmonary edema. No disproportionately dilated small bowel loops or significant air-fluid levels. Moderate colorectal stool, most prominent in the rectum. No evidence of pneumatosis or pneumoperitoneum. No radiopaque nephrolithiasis. IMPRESSION: 1. Stable mild cardiomegaly, without pulmonary edema. No active pulmonary disease. 2. Nonobstructive bowel gas pattern. 3. Moderate colorectal stool, most prominent in the rectum, which may indicate constipation. Electronically Signed   By: Ilona Sorrel M.D.   On: 10/25/2018 10:39   EKG Interpretation  Date/Time:  Sunday October 25 2018 09:53:28 EST Ventricular Rate:  96 PR Interval:    QRS Duration: 109 QT Interval:  334 QTC Calculation: 422 R Axis:   44 Text Interpretation:  Sinus rhythm Biatrial enlargement Nonspecific T abnormalities, lateral leads Baseline wander in lead(s) V2 No STEMI.  Confirmed by Nanda Quinton [phone]) on 10/25/2018 10:12:03 AM   Pending Labs Unresulted Labs (From admission, onward)    Start     Ordered   10/25/18 0957  Urine culture  ONCE - STAT,   STAT    Question:  Patient immune status  Answer:  Normal   10/25/18 0957   Signed and Held  CBC  Tomorrow morning,   R     Signed and Held          Vitals/Pain Today's Vitals   10/25/18 1338 10/25/18 1435 10/25/18 1500 10/25/18 1525  BP: 122/66 126/70 119/66 119/66  Pulse: 91  89 98 (!) 104  Resp: 17 15 15  (!) 21  Temp:      TempSrc:      SpO2: 96% 100% 99% 100%  Weight:      Height:      PainSc:        Isolation Precautions No active isolations  Medications Medications  sodium chloride 0.9 % bolus 500 mL (0 mLs Intravenous Stopped 10/25/18  1209)    Mobility walks with device

## 2018-10-25 NOTE — ED Notes (Signed)
Bed: WA17 Expected date:  Expected time:  Means of arrival:  Comments: Failure to [facility]

## 2018-10-25 NOTE — ED Notes (Signed)
6E Room 1620, Report given to [facility].

## 2018-10-25 NOTE — ED Provider Notes (Signed)
Emergency Department Provider Note   I have reviewed the triage vital signs and the nursing notes.   HISTORY  Chief Complaint Hypotension and Failure To Thrive   HPI Kristen Lozano is a 62 y.o. female with PMH of systolic CHF, DM, HLD, HTN, and prior CVA resents to the emergency department with generalized weakness, low blood pressures at home, and refusal to eat food.  Husband states the patient has been taking her medications until yesterday when she stopped taking them.  Patient denies any chest pain or shortness of breath.  Husband states that she does refuse to talk when her abdomen is hurting her and she began complaining of abdominal pain again yesterday.  She did have one episode of nonbloody emesis.  No diarrhea.  The husband called EMS who reported hypotension on scene with systolic blood pressure in the 80s.  Husband denies any fevers or shaking chills.  Husband reports the patient's mental status and speech are at baseline for her.   Past Medical History:  Diagnosis Date  . Acute combined systolic and diastolic HF (heart failure) ([place]) 10/16/2018  . Allergic rhinitis    Requires cetirizine, singulair, and fluticasone.  . Anxiety    Has been on Xanax since 2009. Uses it for stress, anxiety, and insomnia. No contract yet.  . Arthritis   . CHF (congestive heart failure) (HCC)    EF 35% after stroke, presumed ischemic  . Chronic pain    Has OA of knees B. No Xrays in echart. Not requiring narcotics.  . Colitis 12/2017  . Depression   . Diabetes mellitus    Type 2, non insulin dependent. Was dx'd prior to 2008.  Marland Kitchen Hyperglycemia   . Hyperlipemia   . Hypertension   . Sickle cell trait ([place])   . Stroke [facility]) 09/05/14   Dominant left MCA infarcts secondary to unknown embolic source     Patient Active Problem List   Diagnosis Date Noted  . General weakness 10/25/2018  . Acute combined systolic and diastolic HF (heart failure) ([place]) 10/16/2018  . HLD (hyperlipidemia)  09/21/2018  . GERD (gastroesophageal reflux disease) 09/21/2018  . Nausea and vomiting 09/08/2018  . Nausea with vomiting 09/07/2018  . Dyspnea   . Hypomagnesemia   . Sinus tachycardia   . Malnutrition of moderate degree 08/13/2018  . Enteritis   . Pleural effusion   . Nausea 08/11/2018  . Dilated cardiomyopathy ([place])   . Nausea & vomiting 08/09/2018  . Type 2 diabetes mellitus with vascular disease ([place]) 08/09/2018  . Abdominal pain 08/09/2018  . Infectious colitis 01/11/2018  . Hypokalemia 01/11/2018  . Uncontrolled type 2 diabetes mellitus with hyperglycemia ([place])   . Chronic combined systolic and diastolic CHF (congestive heart failure) ([place]) 12/16/2016  . Monitoring for Sotirios Navarro-term anticoagulant use 11/14/2016  . DKA (diabetic ketoacidoses) ([place]) 11/01/2016  . Diabetes mellitus 11/01/2016  . Elevated troponin   . Nonischemic cardiomyopathy ([place])   . LV (left ventricular) mural thrombus following MI ([place])   . Cognitive deficit due to recent stroke 10/18/2014  . History of completed stroke 10/18/2014  . Abnormal stress test 10/17/2014  . Dysphagia, pharyngoesophageal phase 09/23/2014  . Abnormal CT scan 09/23/2014  . Dilated bile duct 09/23/2014  . Urinary retention 09/07/2014  . 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 Stroke ([place]) 09/05/2014  . Cerebral infarction ([place]) 09/05/2014  . Non compliance w medication regimen 08/10/2012  . Fatigue 02/18/2012  . Essential hypertension 03/22/2011  . Healthcare maintenance 03/22/2011  .  Chronic pain   . ALLERGIC RHINITIS, SEASONAL 03/14/2008  . SICKLE CELL TRAIT 10/31/2006  . Anxiety state 10/31/2006  . DEPRESSION 10/31/2006    Past Surgical History:  Procedure Laterality Date  . BIOPSY  09/23/2018   Procedure: BIOPSY;  Surgeon: Yetta Flock, MD;  Location: [facility];  Service: Gastroenterology;;  . ESOPHAGOGASTRODUODENOSCOPY (EGD) WITH PROPOFOL N/A 09/23/2018   Procedure: ESOPHAGOGASTRODUODENOSCOPY (EGD)  WITH PROPOFOL;  Surgeon: Yetta Flock, MD;  Location: [facility];  Service: Gastroenterology;  Laterality: N/A;  . RIGHT/LEFT HEART CATH AND CORONARY ANGIOGRAPHY N/A 10/14/2018   Procedure: RIGHT/LEFT HEART CATH AND CORONARY ANGIOGRAPHY;  Surgeon: Troy Sine, MD;  Location: [place] [facility];  Service: Cardiovascular;  Laterality: N/A;  . TEE WITHOUT CARDIOVERSION N/A 09/06/2014   Procedure: TRANSESOPHAGEAL ECHOCARDIOGRAM (TEE);  Surgeon: Pixie Casino, MD;  Location: [facility] ENDOSCOPY;  Service: Cardiovascular;  Laterality: N/A;   Allergies Patient has no known allergies.  Family History  Problem Relation Age of Onset  . Diabetes Mother   . Hypertension Mother   . Heart disease Father   . Heart attack Father   . Hypertension Father   . Diabetes Father   . Ovarian cancer Sister   . Liver cancer Sister   . Sickle cell anemia Daughter   . Schizophrenia Daughter   . Hypertension Sister   . Hypertension Brother   . Hypertension Daughter   . Kidney disease Sister        x2  . Kidney disease Brother   . Stroke Neg Hx   . Esophageal cancer Neg Hx   . Colon cancer Neg Hx   . Colon polyps Neg Hx     Social History Social History   Tobacco Use  . Smoking status: Never Smoker  . Smokeless tobacco: Never Used  Substance Use Topics  . Alcohol use: No    Alcohol/week: 0.0 standard drinks  . Drug use: No    Review of Systems  Constitutional: No fever/chills Eyes: No visual changes. ENT: No sore throat. Cardiovascular: Denies chest pain. Respiratory: Denies shortness of breath. Gastrointestinal: Positive abdominal pain. Positive nausea and vomiting.  No diarrhea.  No constipation. Genitourinary: Negative for dysuria. Musculoskeletal: Negative for back pain. Skin: Negative for rash. Neurological: Negative for headaches, focal weakness or numbness.  10-point ROS otherwise negative.  ____________________________________________   PHYSICAL EXAM:  VITAL  SIGNS: ED Triage Vitals  Enc Vitals Group     BP 10/25/18 0950 109/84     Pulse Rate 10/25/18 0950 90     Resp 10/25/18 0950 16     Temp 10/25/18 0949 (S) 98.1 F (36.7 C)     Temp Source 10/25/18 0949 (S) Rectal     SpO2 10/25/18 0939 96 %     Weight 10/25/18 0947 101 lb 6.6 oz (46 kg)     Height 10/25/18 0947 5' 3"  (1.6 m)     Pain Score 10/25/18 0947 0   Constitutional: Alert. Communicating with grunting and saying "yes" to questions. Well appearing and in no acute distress. Eyes: Conjunctivae are normal.  Head: Atraumatic. Nose: No congestion/rhinnorhea. Mouth/Throat: Mucous membranes are moist.  Oropharynx non-erythematous. Neck: No stridor. Cardiovascular: Normal rate, regular rhythm. Good peripheral circulation. Grossly normal heart sounds.   Respiratory: Normal respiratory effort.  No retractions. Lungs CTAB. Gastrointestinal: Soft and nontender. No distention.  Musculoskeletal: No lower extremity tenderness nor edema. No gross deformities of extremities. Neurologic: One word answering. No apparent confusion. Speech appears clear but not speaking  fluently. No gross focal neurologic deficits are appreciated.  Skin:  Skin is warm, dry and intact. No rash noted.  ____________________________________________   LABS (all labs ordered are listed, but only abnormal results are displayed)  Labs Reviewed  COMPREHENSIVE METABOLIC PANEL - Abnormal; Notable for the following components:      Result Value   Sodium 134 (*)    Chloride 91 (*)    Glucose, Bld 158 (*)    Total Protein 8.7 (*)    AST 50 (*)    ALT 61 (*)    Total Bilirubin 1.3 (*)    All other components within normal limits  BRAIN NATRIURETIC PEPTIDE - Abnormal; Notable for the following components:   B Natriuretic Peptide 267.6 (*)    All other components within normal limits  CBC WITH DIFFERENTIAL/PLATELET - Abnormal; Notable for the following components:   RBC 7.79 (*)    Hemoglobin 16.4 (*)    HCT 51.4 (*)     MCV 66.0 (*)    MCH 21.1 (*)    RDW 17.8 (*)    All other components within normal limits  URINALYSIS, ROUTINE W REFLEX MICROSCOPIC - Abnormal; Notable for the following components:   Glucose, UA 50 (*)    Ketones, ur 20 (*)    Protein, ur 30 (*)    All other components within normal limits  PROTIME-INR - Abnormal; Notable for the following components:   Prothrombin Time 26.0 (*)    All other components within normal limits  GLUCOSE, CAPILLARY - Abnormal; Notable for the following components:   Glucose-Capillary 165 (*)    All other components within normal limits  I-STAT CG4 LACTIC ACID, ED - Abnormal; Notable for the following components:   Lactic Acid, Venous 2.21 (*)    All other components within normal limits  URINE CULTURE  ETHANOL  CBC  I-STAT TROPONIN, ED   ____________________________________________  EKG   EKG Interpretation  Date/Time:  Sunday October 25 2018 09:53:28 EST Ventricular Rate:  96 PR Interval:    QRS Duration: 109 QT Interval:  334 QTC Calculation: 422 R Axis:   44 Text Interpretation:  Sinus rhythm Biatrial enlargement Nonspecific T abnormalities, lateral leads Baseline wander in lead(s) V2 No STEMI.  Confirmed by Nanda Quinton [id number] on 10/25/2018 10:12:03 AM       ____________________________________________  RADIOLOGY  Ct Head Wo Contrast  Result Date: 10/25/2018 CLINICAL DATA:  Altered level of consciousness. EXAM: CT HEAD WITHOUT CONTRAST TECHNIQUE: Contiguous axial images were obtained from the base of the skull through the vertex without intravenous contrast. COMPARISON:  10/11/2018. FINDINGS: Brain: No evidence of acute infarction, hemorrhage, hydrocephalus, extra-axial collection or mass lesion/mass effect. There is mild diffuse low-attenuation within the subcortical and periventricular white matter compatible with chronic microvascular disease. Chronic left basal ganglia infarct with ex vacuo dilatation of the anterior horn of  left lateral ventricle is again noted. Additional chronic infarcts involve the left parietal lobe, left occipital lobe, and right posterior parietal lobe. Vascular: No hyperdense vessel or unexpected calcification. Skull: Normal. Negative for fracture or focal lesion. Sinuses/Orbits: No acute finding. Other: None. IMPRESSION: 1. No acute intracranial abnormality. 2. Chronic small vessel ischemic disease with chronic bilateral parietal, left occipital and left basal ganglia infarcts. Electronically Signed   By: Kerby Moors M.D.   On: 10/25/2018 10:48   Dg Abdomen Acute W/chest  Result Date: 10/25/2018 CLINICAL DATA:  Vomiting EXAM: DG ABDOMEN ACUTE W/ 1V CHEST COMPARISON:  10/11/2018 CT abdomen/pelvis. 09/01/2018 chest  radiograph. FINDINGS: Stable cardiomediastinal silhouette with mild cardiomegaly. No pneumothorax. No pleural effusion. Lungs appear clear, with no acute consolidative airspace disease and no pulmonary edema. No disproportionately dilated small bowel loops or significant air-fluid levels. Moderate colorectal stool, most prominent in the rectum. No evidence of pneumatosis or pneumoperitoneum. No radiopaque nephrolithiasis. IMPRESSION: 1. Stable mild cardiomegaly, without pulmonary edema. No active pulmonary disease. 2. Nonobstructive bowel gas pattern. 3. Moderate colorectal stool, most prominent in the rectum, which may indicate constipation. Electronically Signed   By: Ilona Sorrel M.D.   On: 10/25/2018 10:39    ____________________________________________   PROCEDURES  Procedure(s) performed:   Procedures  CRITICAL CARE Performed by: Margette Fast Total critical care time: 35 minutes Critical care time was exclusive of separately billable procedures and treating other patients. Critical care was necessary to treat or prevent imminent or life-threatening deterioration. Critical care was time spent personally by me on the following activities: development of treatment plan with  patient and/or surrogate as well as nursing, discussions with consultants, evaluation of patient's response to treatment, examination of patient, obtaining history from patient or surrogate, ordering and performing treatments and interventions, ordering and review of laboratory studies, ordering and review of radiographic studies, pulse oximetry and re-evaluation of patient's condition.  Nanda Quinton, MD Emergency Medicine  ____________________________________________   INITIAL IMPRESSION / ASSESSMENT AND PLAN / ED COURSE  Pertinent labs & imaging results that were available during my care of the patient were reviewed by me and considered in my medical decision making (see chart for details).  Patient presents to the emergency department with generalized weakness, abdominal pain with vomiting, and hypotension with EMS.  Blood pressure here is normal with systolic 782 and map in the 90s.  Afebrile.  Speech is apparently at baseline according to husband.  No concern for acute stroke.  Abdomen is mildly tender with no peritoneal findings.  Plan for labs, gentle IV fluids with systolic heart failure, CT head, and abdomen x-ray series.  The patient is noted to have a lactate>2. With the current information available to me, I don't think the patient is in septic shock. The lactate>2, is related to acute dehydration and poor PO intake.   CT head and plain films reviewed with no acute findings. Normal troponin.   Discussed patient's case with Hospitalist to request admission. Patient and family (if present) updated with plan. Care transferred to Hospitalist service.  I reviewed all nursing notes, vitals, pertinent old records, EKGs, labs, imaging (as available).  ____________________________________________  FINAL CLINICAL IMPRESSION(S) / ED DIAGNOSES  Final diagnoses:  Generalized abdominal pain  Nausea  Hypotension, unspecified hypotension type  Dehydration     MEDICATIONS GIVEN DURING  THIS VISIT:  Medications  aspirin EC tablet 81 mg (81 mg Oral Given 10/25/18 1813)  insulin glargine (LANTUS) injection 10 Units (has no administration in time range)  metFORMIN (GLUCOPHAGE-XR) 24 hr tablet 1,000 mg (1,000 mg Oral Given 10/25/18 1818)  ondansetron (ZOFRAN) tablet 4 mg (4 mg Oral Given 10/25/18 2022)  pantoprazole (PROTONIX) EC tablet 40 mg (40 mg Oral Given 10/25/18 1818)  sucralfate (CARAFATE) 1 GM/10ML suspension 1 g (1 g Oral Given 10/25/18 2022)  feeding supplement (ENSURE ENLIVE) (ENSURE ENLIVE) liquid 237 mL (237 mLs Oral Given 10/25/18 2023)  potassium chloride (K-DUR,KLOR-CON) CR tablet 10 mEq (10 mEq Oral Given 10/25/18 1818)  0.9 %  sodium chloride infusion (has no administration in time range)  insulin aspart (novoLOG) injection 0-5 Units (has no administration in time range)  insulin aspart (novoLOG) injection 0-9 Units (2 Units Subcutaneous Given 10/25/18 1746)  atorvastatin (LIPITOR) tablet 20 mg (20 mg Oral Given 10/25/18 1814)  warfarin (COUMADIN) tablet 5 mg (has no administration in time range)    Or  warfarin (COUMADIN) tablet 7.5 mg (has no administration in time range)  Warfarin - Physician Dosing Inpatient (has no administration in time range)  sodium chloride 0.9 % bolus 500 mL (0 mLs Intravenous Stopped 10/25/18 1209)  lactulose (CHRONULAC) 10 GM/15ML solution 30 g (30 g Oral Given 10/25/18 2020)    Note:  This document was prepared using Dragon voice recognition software and may include unintentional dictation errors.  Nanda Quinton, MD Emergency Medicine    Mishelle Hassan, [facility], MD 10/25/18 2042

## 2018-10-25 NOTE — ED Triage Notes (Signed)
Patient comes from home. Patient arrived via [facility]. Patient husband called 911 due to patient not eating, drinking, or taking medications. Patient did vomit once today per patient husband. EMS did do stroke scale on patient due to patient refusing to answer any and all questions. Stroke scale was negative. Patient Nods and grunts. Patient will state her name when asked. Patient Husband states she is AOx4 and that she has done this before within the past month.

## 2018-10-25 NOTE — H&P (Signed)
History and Physical  [name]:[id number] DOB: 07/11/1956 DOA: 10/25/2018  Referring physician: Dr. Laverta Baltimore PCP: Audley Hose, MD  Outpatient Specialists: None Patient coming from: Home & is able to ambulate none  Chief Complaint: Generalized weakness and fatigue  HPI: Kristen Lozano is a 62 y.o. female with medical history significant for past medical history for systolic hypertension congestive heart failure systolic type 2 diabetes mellitus, hyperlipidemia, hypertension, prior CVA, presented with generalized weakness and refusing food.  Husband stated that the patient had been taking her medication  until yesterday when she stopped taking them.  Patient complains of abdominal pain per her husband and when she has abdominal pain she stops taking her medication.  And that she has been taking her medicine regularly until yesterday.  She had one episode of nonbloody emesis.  Patient denies chest pain or shortness of breath fever chills or diarrhea.  Her husband called EMS and when they arrived they found that her blood pressure was low with systolic blood pressure in the 80s.  Husband reports that the patient's mental status is at baseline.    ED Course: ED course patient was given IV fluid head CT was done because of initial question of mental status change.  Her lactate was elevated at 2 might be related to dehydration or poor p.o. intake her troponin was negative  Review of Systems: Patient seen abdominal pain Anorexia generalized weakness.    Pt denies any fever chills dysuria diarrhea  .  Review of systems are otherwise negative   Past Medical History:  Diagnosis Date  . Acute combined systolic and diastolic HF (heart failure) ([place]) 10/16/2018  . Allergic rhinitis    Requires cetirizine, singulair, and fluticasone.  . Anxiety    Has been on Xanax since 2009. Uses it for stress, anxiety, and insomnia. No contract yet.  . Arthritis   . CHF (congestive heart failure)  (HCC)    EF 35% after stroke, presumed ischemic  . Chronic pain    Has OA of knees B. No Xrays in echart. Not requiring narcotics.  . Colitis 12/2017  . Depression   . Diabetes mellitus    Type 2, non insulin dependent. Was dx'd prior to 2008.  Marland Kitchen Hyperglycemia   . Hyperlipemia   . Hypertension   . Sickle cell trait ([place])   . Stroke [facility]) 09/05/14   Dominant left MCA infarcts secondary to unknown embolic source    Past Surgical History:  Procedure Laterality Date  . BIOPSY  09/23/2018   Procedure: BIOPSY;  Surgeon: Yetta Flock, MD;  Location: [facility] ENDOSCOPY;  Service: Gastroenterology;;  . ESOPHAGOGASTRODUODENOSCOPY (EGD) WITH PROPOFOL N/A 09/23/2018   Procedure: ESOPHAGOGASTRODUODENOSCOPY (EGD) WITH PROPOFOL;  Surgeon: Yetta Flock, MD;  Location: [facility]OS[facility];  Service: Gastroenterology;  Laterality: N/A;  . RIGHT/LEFT HEART CATH AND CORONARY ANGIOGRAPHY N/A 10/14/2018   Procedure: RIGHT/LEFT HEART CATH AND CORONARY ANGIOGRAPHY;  Surgeon: Troy Sine, MD;  Location: [place] CV LAB;  Service: Cardiovascular;  Laterality: N/A;  . TEE WITHOUT CARDIOVERSION N/A 09/06/2014   Procedure: TRANSESOPHAGEAL ECHOCARDIOGRAM (TEE);  Surgeon: Pixie Casino, MD;  Location: [facility] ENDOSCOPY;  Service: Cardiovascular;  Laterality: N/A;    Social History:  reports that she has never smoked. She has never used smokeless tobacco. She reports that she does not drink alcohol or use drugs.   No Known Allergies  Family History  Problem Relation Age of Onset  . Diabetes Mother   . Hypertension Mother   .  Heart disease Father   . Heart attack Father   . Hypertension Father   . Diabetes Father   . Ovarian cancer Sister   . Liver cancer Sister   . Sickle cell anemia Daughter   . Schizophrenia Daughter   . Hypertension Sister   . Hypertension Brother   . Hypertension Daughter   . Kidney disease Sister        x2  . Kidney disease Brother   . Stroke Neg Hx   . Esophageal  cancer Neg Hx   . Colon cancer Neg Hx   . Colon polyps Neg Hx       Prior to Admission medications   Medication Sig Start Date End Date Taking? Authorizing Provider  ASPIRIN LOW DOSE 81 MG EC tablet Take 1 tablet (81 mg total) by mouth daily. Patient taking differently: Take 81 mg by mouth daily.  01/22/18  Yes Hilty, Nadean Corwin, MD  atorvastatin (LIPITOR) 20 MG tablet Take 1 tablet (20 mg total) by mouth daily at 6 PM. Please schedule appointment for refills. 01/02/18  Yes Hilty, Nadean Corwin, MD  feeding supplement, ENSURE ENLIVE, (ENSURE ENLIVE) LIQD Take 237 mLs by mouth 3 (three) times daily between meals. 08/17/18  Yes Oretha Milch D, MD  furosemide (LASIX) 40 MG tablet Take 1 tablet (40 mg total) by mouth daily. 10/17/18  Yes Isaiah Serge, NP  guaiFENesin (ROBITUSSIN) 100 MG/5ML SOLN Take 15 mLs (300 mg total) by mouth 4 (four) times daily. 10/16/18  Yes Isaiah Serge, NP  insulin glargine (LANTUS) 100 UNIT/ML injection Inject 0.1 mLs (10 Units total) into the skin at bedtime. 09/25/18  Yes Oswald Hillock, MD  losartan (COZAAR) 25 MG tablet Take 0.5 tablets (12.5 mg total) by mouth daily. 10/17/18  Yes Isaiah Serge, NP  metFORMIN (GLUCOPHAGE-XR) 500 MG 24 hr tablet Take 2 tablets (1,000 mg total) by mouth 2 (two) times daily. 10/17/18  Yes Isaiah Serge, NP  metoprolol succinate (TOPROL-XL) 100 MG 24 hr tablet Take 1 tablet (100 mg total) by mouth daily. Take with or immediately following a meal.  In the Morning 10/17/18  Yes Isaiah Serge, NP  metoprolol succinate (TOPROL-XL) 50 MG 24 hr tablet Take 1 tablet (50 mg total) by mouth at bedtime. Take with or immediately following a meal. 10/16/18  Yes Isaiah Serge, NP  ondansetron (ZOFRAN) 4 MG tablet Take 1 tablet (4 mg total) by mouth every 12 (twelve) hours. 09/25/18  Yes Oswald Hillock, MD  pantoprazole (PROTONIX) 40 MG tablet Take 1 tablet (40 mg total) by mouth daily with supper. 10/16/18  Yes Isaiah Serge, NP  potassium  chloride (K-DUR) 10 MEQ tablet Take 1 tablet (10 mEq total) by mouth daily. 09/02/18  Yes Georgette Shell, MD  sucralfate (CARAFATE) 1 GM/10ML suspension Take 10 mLs (1 g total) by mouth 4 (four) times daily -  with meals and at bedtime for 10 days. 10/19/18 10/29/18 Yes Long, Wonda Olds, MD  warfarin (COUMADIN) 5 MG tablet Take 1 to 1.5 tablets by mouth daily as directed by coumadin clinic  Take 1.5 pills on Mondays and Fridays and 1 pill Tuesday, Wed. THursday, Saturday and Sunday 10/16/18  Yes Isaiah Serge, NP  docusate sodium (COLACE) 100 MG capsule Take 1 capsule (100 mg total) by mouth 2 (two) times daily as needed for mild constipation. Patient not taking: Reported on 10/25/2018 10/16/18   Isaiah Serge, NP  ivabradine (CORLANOR) 5 MG TABS  tablet Take 1 tablet (5 mg total) by mouth 2 (two) times daily with a meal. Patient not taking: Reported on 10/25/2018 10/16/18   Isaiah Serge, NP  polyethylene glycol (MIRALAX / Floria Raveling) packet Take 17 g by mouth daily as needed. Patient not taking: Reported on 10/25/2018 09/09/18   Desiree Hane, MD    Physical Exam: BP 126/70   Pulse 89   Temp 98.1 F (36.7 C) (Rectal)   Resp 15   Ht 5\' 3"  (1.6 m)   Wt 46 kg   SpO2 100%   BMI 17.96 kg/m   Exam:  . General: 62 y.o. year-old female well developed, malnourished cachectic chronically ill looking ,    in no acute distress.  Alert and oriented x1 patient only answers yes to everything on nods . Cardiovascular: Regular rate and rhythm with no rubs or gallops.  No thyromegaly or JVD noted.   Marland Kitchen Respiratory: Clear to auscultation with no wheezes or rales. Good inspiratory effort. . Abdomen: Soft nontender nondistended with normal bowel sounds x4 quadrants. . Musculoskeletal: No lower extremity edema. 2/4 pulses in all 4 extremities. . Skin: No ulcerative lesions noted or rashes, . Psychiatry: Mood is appropriate for condition and setting           Labs on Admission:  Basic Metabolic  Panel: Recent Labs  Lab 10/19/18 2051 10/25/18 1053  NA 132* 134*  K 4.0 3.6  CL 97* 91*  CO2 22 28  GLUCOSE 263* 158*  BUN 12 23  CREATININE 0.96 0.91  CALCIUM 9.5 9.8   Liver Function Tests: Recent Labs  Lab 10/19/18 2051 10/25/18 1053  AST 39 50*  ALT 38 61*  ALKPHOS 45 73  BILITOT 1.3* 1.3*  PROT 7.3 8.7*  ALBUMIN 3.6 4.3   No results for input(s): LIPASE, AMYLASE in the last 168 hours. No results for input(s): AMMONIA in the last 168 hours. CBC: Recent Labs  Lab 10/19/18 2051 10/25/18 1053  WBC 9.6 8.0  NEUTROABS 6.5 5.2  HGB 14.6 16.4*  HCT 47.2* 51.4*  MCV 67.5* 66.0*  PLT 222 338   Cardiac Enzymes: Recent Labs  Lab 10/19/18 2051  TROPONINI <0.03    BNP (last 3 results) Recent Labs    10/11/18 2051 10/19/18 2051 10/25/18 1053  BNP 957.6* 495.6* 267.6*    ProBNP (last 3 results) No results for input(s): PROBNP in the last 8760 hours.  CBG: Recent Labs  Lab 10/19/18 2008  GLUCAP 260*    Radiological Exams on Admission: Ct Head Wo Contrast  Result Date: 10/25/2018 CLINICAL DATA:  Altered level of consciousness. EXAM: CT HEAD WITHOUT CONTRAST TECHNIQUE: Contiguous axial images were obtained from the base of the skull through the vertex without intravenous contrast. COMPARISON:  10/11/2018. FINDINGS: Brain: No evidence of acute infarction, hemorrhage, hydrocephalus, extra-axial collection or mass lesion/mass effect. There is mild diffuse low-attenuation within the subcortical and periventricular white matter compatible with chronic microvascular disease. Chronic left basal ganglia infarct with ex vacuo dilatation of the anterior horn of left lateral ventricle is again noted. Additional chronic infarcts involve the left parietal lobe, left occipital lobe, and right posterior parietal lobe. Vascular: No hyperdense vessel or unexpected calcification. Skull: Normal. Negative for fracture or focal lesion. Sinuses/Orbits: No acute finding. Other: None.  IMPRESSION: 1. No acute intracranial abnormality. 2. Chronic small vessel ischemic disease with chronic bilateral parietal, left occipital and left basal ganglia infarcts. Electronically Signed   By: Kerby Moors M.D.   On: 10/25/2018  10:48   Dg Abdomen Acute W/chest  Result Date: 10/25/2018 CLINICAL DATA:  Vomiting EXAM: DG ABDOMEN ACUTE W/ 1V CHEST COMPARISON:  10/11/2018 CT abdomen/pelvis. 09/01/2018 chest radiograph. FINDINGS: Stable cardiomediastinal silhouette with mild cardiomegaly. No pneumothorax. No pleural effusion. Lungs appear clear, with no acute consolidative airspace disease and no pulmonary edema. No disproportionately dilated small bowel loops or significant air-fluid levels. Moderate colorectal stool, most prominent in the rectum. No evidence of pneumatosis or pneumoperitoneum. No radiopaque nephrolithiasis. IMPRESSION: 1. Stable mild cardiomegaly, without pulmonary edema. No active pulmonary disease. 2. Nonobstructive bowel gas pattern. 3. Moderate colorectal stool, most prominent in the rectum, which may indicate constipation. Electronically Signed   By: Ilona Sorrel M.D.   On: 10/25/2018 10:39    EKG: No STEMI per ER MD  Assessment/Plan Present on Admission: . Cerebral infarction ([place]) . Essential hypertension . Fatigue . Uncontrolled type 2 diabetes mellitus with hyperglycemia ([place]) . (Resolved) Systolic heart failure (HCC)  Principal Problem:   Fatigue Active Problems:   Essential hypertension   Cerebral infarction [facility])   Cognitive deficit due to recent stroke   Uncontrolled type 2 diabetes mellitus with hyperglycemia (HCC)   General weakness  1.  Generalized weakness lactic acid was positive for 2.2 but may be due to dehydration cultures are pending  2.  Anorexia most likely due to abdominal pain  3.  Abdominal pain nonspecific.  Abdominal x-ray shows increased stool burden  4.  Constipation we will start her on MiraLAX for constipation  5.  Hypotension  blood pressure improved with IV hydration in the emergency department  6.  Systolic  heart failure chest x-ray does not show any pulmonary edema only mild cardiomegaly  7.  Mild hyponatremia will replace with IV fluid  8.  Uncontrolled type 2 diabetes mellitus  coverage will be done with insulin  Severity of Illness: The appropriate patient status for this patient is OBSERVATION. Observation status is judged to be reasonable and necessary in order to provide the required intensity of service to ensure the patient's safety. The patient's presenting symptoms, physical exam findings, and initial radiographic and laboratory data in the context of their medical condition is felt to place them at decreased risk for further clinical deterioration. Furthermore, it is anticipated that the patient will be medically stable for discharge from the hospital within 2 midnights of admission. The following factors support the patient status of observation.   " The patient's presenting symptoms include generalized weakness with nonspecific symptoms. " The physical exam findings include slightly elevated lactic acid. " The initial radiographic and laboratory data are increased stool burden with abdominal pain.     DVT prophylaxis: Coumadin  Code Status: Full  Family Communication: None at bedside  Disposition Plan: Home  Consults called: None  Admission status: 24-hour observation for IV hydration further management    Cristal Deer MD [facility] Pager [phone]  If 7PM-7AM, please contact night-coverage [email or web address] Password [email or web address]  10/25/2018, 3:08 PM

## 2018-10-25 NOTE — ED Notes (Signed)
ED Provider at bedside. 

## 2018-10-25 NOTE — ED Notes (Signed)
Patient transported to X-ray 

## 2018-10-25 NOTE — ED Notes (Signed)
ED Provider at bedside with patient and husband.

## 2018-10-26 ENCOUNTER — Observation Stay (HOSPITAL_COMMUNITY): Payer: Medicare HMO

## 2018-10-26 DIAGNOSIS — I10 Essential (primary) hypertension: Secondary | ICD-10-CM | POA: Diagnosis not present

## 2018-10-26 DIAGNOSIS — R531 Weakness: Secondary | ICD-10-CM | POA: Diagnosis not present

## 2018-10-26 DIAGNOSIS — R5383 Other fatigue: Secondary | ICD-10-CM | POA: Diagnosis not present

## 2018-10-26 DIAGNOSIS — I5022 Chronic systolic (congestive) heart failure: Secondary | ICD-10-CM | POA: Diagnosis not present

## 2018-10-26 LAB — CBC
HCT: 43.9 % (ref 36.0–46.0)
Hemoglobin: 14.2 g/dL (ref 12.0–15.0)
MCH: 21.8 pg — ABNORMAL LOW (ref 26.0–34.0)
MCHC: 32.3 g/dL (ref 30.0–36.0)
MCV: 67.4 fL — ABNORMAL LOW (ref 80.0–100.0)
Platelets: 278 10*3/uL (ref 150–400)
RBC: 6.51 MIL/uL — ABNORMAL HIGH (ref 3.87–5.11)
RDW: 17.2 % — ABNORMAL HIGH (ref 11.5–15.5)
WBC: 10.7 10*3/uL — ABNORMAL HIGH (ref 4.0–10.5)
nRBC: 0 % (ref 0.0–0.2)

## 2018-10-26 LAB — GLUCOSE, CAPILLARY
GLUCOSE-CAPILLARY: 287 mg/dL — AB (ref 70–99)
Glucose-Capillary: 278 mg/dL — ABNORMAL HIGH (ref 70–99)
Glucose-Capillary: 199 mg/dL — ABNORMAL HIGH (ref 70–99)

## 2018-10-26 LAB — URINE CULTURE
Culture: NO GROWTH
Special Requests: NORMAL

## 2018-10-26 LAB — TSH: TSH: 2.02 u[IU]/mL (ref 0.350–4.500)

## 2018-10-26 LAB — VITAMIN B12: Vitamin B-12: 876 pg/mL (ref 180–914)

## 2018-10-26 MED ORDER — METOPROLOL SUCCINATE ER 50 MG PO TB24
50.0000 mg | ORAL_TABLET | Freq: Every day | ORAL | Status: DC
  Administered 2018-10-26 – 2018-10-27 (×2): 50 mg via ORAL
  Filled 2018-10-26 (×2): qty 1

## 2018-10-26 MED ORDER — POLYETHYLENE GLYCOL 3350 17 G PO PACK
17.0000 g | PACK | Freq: Every day | ORAL | Status: DC
  Administered 2018-10-26 – 2018-10-28 (×3): 17 g via ORAL
  Filled 2018-10-26 (×3): qty 1

## 2018-10-26 MED ORDER — METOPROLOL SUCCINATE ER 50 MG PO TB24
100.0000 mg | ORAL_TABLET | Freq: Every day | ORAL | Status: DC
  Administered 2018-10-27 – 2018-10-28 (×2): 100 mg via ORAL
  Filled 2018-10-26: qty 2

## 2018-10-26 MED ORDER — WARFARIN SODIUM 5 MG PO TABS: 5.0000 mg | ORAL_TABLET | ORAL | Status: DC

## 2018-10-26 MED ORDER — WARFARIN SODIUM 5 MG PO TABS
5.0000 mg | ORAL_TABLET | ORAL | Status: DC
  Filled 2018-10-26: qty 1

## 2018-10-26 MED ORDER — WARFARIN SODIUM 5 MG PO TABS: 7.5000 mg | ORAL_TABLET | ORAL | Status: DC

## 2018-10-26 MED ORDER — ALPRAZOLAM 0.5 MG PO TABS
0.5000 mg | ORAL_TABLET | Freq: Three times a day (TID) | ORAL | Status: DC | PRN
  Administered 2018-10-26: 0.5 mg via ORAL
  Filled 2018-10-26: qty 1

## 2018-10-26 MED ORDER — WARFARIN SODIUM 5 MG PO TABS
7.5000 mg | ORAL_TABLET | ORAL | Status: DC
  Administered 2018-10-26: 7.5 mg via ORAL

## 2018-10-26 MED ORDER — LORAZEPAM 2 MG/ML IJ SOLN
1.0000 mg | Freq: Once | INTRAMUSCULAR | Status: AC
  Administered 2018-10-26: 1 mg via INTRAVENOUS
  Filled 2018-10-26: qty 1

## 2018-10-26 MED ORDER — LOSARTAN POTASSIUM 25 MG PO TABS
12.5000 mg | ORAL_TABLET | Freq: Every day | ORAL | Status: DC
  Administered 2018-10-26 – 2018-10-28 (×3): 12.5 mg via ORAL
  Filled 2018-10-26 (×3): qty 0.5

## 2018-10-26 NOTE — Progress Notes (Signed)
[facility]  PROGRESS NOTE  Kristen Lozano ZRA:[id number] DOB: 1956-02-03 DOA: 10/25/2018 PCP: Audley Hose, MD   Brief HPI:   62 year old female with history of hypertension, CHF, type 2 diabetes mellitus, hyperlipidemia, hypertension prior CVA was brought to hospital with generalized weakness and poor p.o. intake also has been confused.  Patient recently had cardiac catheterization on 10/16/2018, with no significant coronary obstructive disease.  EF 20 to 25% on recent echo , also has history of left ventricular thrombus on anticoagulation.    Subjective   Patient seen and examined, continues to be confused.  Denies chest pain or shortness of breath.  No abdominal pain.   Assessment/Plan:     1. *Altered mental status-patient has been confused for past few weeks as per her husband.  Not sure about her baseline, she did have history of nonhemorrhagic CVAs in 2015.?  Vascular dementia.  Will obtain MRI brain.  Check TSH, vitamin B12 level, consider neurology consultation.   2. Anorexia/failure to thrive-patient has had poor p.o. intake for past few weeks.  She is constipated.  Started on MiraLAX  3. Chronic systolic heart failure-last EF from echo was 20 to 25%, currently she is euvolemic.  Continue metoprolol, losartan.  Lasix is currently on hold  4. Diabetes mellitus type 2-continue sliding scale insulin with NovoLog.  5. History of left ventricular thrombus-continue warfarin     CBG: Recent Labs  Lab 10/19/18 2008 10/25/18 1723 10/25/18 2107 10/26/18 1203  GLUCAP 260* 165* 203* 278*    CBC: Recent Labs  Lab 10/19/18 2051 10/25/18 1053 10/26/18 0515  WBC 9.6 8.0 10.7*  NEUTROABS 6.5 5.2  --   HGB 14.6 16.4* 14.2  HCT 47.2* 51.4* 43.9  MCV 67.5* 66.0* 67.4*  PLT 222 338 545    Basic Metabolic Panel: Recent Labs  Lab 10/19/18 2051 10/25/18 1053  NA 132* 134*  K 4.0 3.6  CL 97* 91*  CO2 22 28  GLUCOSE 263* 158*  BUN 12 23  CREATININE 0.96  0.91  CALCIUM 9.5 9.8     DVT prophylaxis: Warfarin  Code Status: Full code  Family Communication: Discussed with husband at bedside  Disposition Plan: likely home when medically ready for discharge   Consultants:    Procedures:     Antibiotics:   Anti-infectives (From admission, onward)   None       Objective   Vitals:   10/25/18 2106 10/25/18 2106 10/26/18 0511 10/26/18 1353  BP: 130/90 (!) 130/103 134/70 133/66  Pulse: 92 85 82 (!) 128  Resp: 16  14 16   Temp: 97.8 F (36.6 C)  97.9 F (36.6 C) 97.8 F (36.6 C)  TempSrc: Oral  Oral Oral  SpO2: 97%  98% 97%  Weight:      Height:        Intake/Output Summary (Last 24 hours) at 10/26/2018 1514 Last data filed at 10/26/2018 0900 Gross per 24 hour  Intake 730 ml  Output -  Net 730 ml   Filed Weights   10/25/18 0947  Weight: 46 kg     Physical Examination:    General: Appears in no acute distress  Cardiovascular: S1-S2, regular, no murmur auscultated  Respiratory: Clear to auscultation bilaterally  Abdomen: Soft, nontender, no organomegaly  Extremities: No edema in the lower extremities  Neurologic: Alert, not oriented x3, does not know that she is in the hospital, could not tell me why she is here did not know the president of [place]  Data Reviewed: I have personally reviewed following labs and imaging studies   Recent Results (from the past 240 hour(s))  Urine culture     Status: None   Collection Time: 10/25/18 10:53 AM  Result Value Ref Range Status   Specimen Description   Final    URINE, CATHETERIZED Performed at [facility] [address], [place], [place] [address]    Special Requests   Final    Normal Performed at [facility], [place] [address], [place], [place] [address]    Culture   Final    NO GROWTH Performed at [facility], [employer] [address] [place], [place] [address]    Report Status 10/26/2018 FINAL  Final      Liver Function Tests: Recent Labs  Lab 10/19/18 2051 10/25/18 1053  AST 39 50*  ALT 38 61*  ALKPHOS 45 73  BILITOT 1.3* 1.3*  PROT 7.3 8.7*  ALBUMIN 3.6 4.3   No results for input(s): LIPASE, AMYLASE in the last 168 hours. No results for input(s): AMMONIA in the last 168 hours.  Cardiac Enzymes: Recent Labs  Lab 10/19/18 2051  TROPONINI <0.03   BNP (last 3 results) Recent Labs    10/11/18 2051 10/19/18 2051 10/25/18 1053  BNP 957.6* 495.6* 267.6*    ProBNP (last 3 results) No results for input(s): PROBNP in the last 8760 hours.    Studies: Ct Head Wo Contrast  Result Date: 10/25/2018 CLINICAL DATA:  Altered level of consciousness. EXAM: CT HEAD WITHOUT CONTRAST TECHNIQUE: Contiguous axial images were obtained from the base of the skull through the vertex without intravenous contrast. COMPARISON:  10/11/2018. FINDINGS: Brain: No evidence of acute infarction, hemorrhage, hydrocephalus, extra-axial collection or mass lesion/mass effect. There is mild diffuse low-attenuation within the subcortical and periventricular white matter compatible with chronic microvascular disease. Chronic left basal ganglia infarct with ex vacuo dilatation of the anterior horn of left lateral ventricle is again noted. Additional chronic infarcts involve the left parietal lobe, left occipital lobe, and right posterior parietal lobe. Vascular: No hyperdense vessel or unexpected calcification. Skull: Normal. Negative for fracture or focal lesion. Sinuses/Orbits: No acute finding. Other: None. IMPRESSION: 1. No acute intracranial abnormality. 2. Chronic small vessel ischemic disease with chronic bilateral parietal, left occipital and left basal ganglia infarcts. Electronically Signed   By: Kerby Moors M.D.   On: 10/25/2018 10:48   Dg Abdomen Acute W/chest  Result Date: 10/25/2018 CLINICAL DATA:  Vomiting EXAM: DG ABDOMEN ACUTE W/ 1V CHEST COMPARISON:  10/11/2018 CT abdomen/pelvis. 09/01/2018 chest  radiograph. FINDINGS: Stable cardiomediastinal silhouette with mild cardiomegaly. No pneumothorax. No pleural effusion. Lungs appear clear, with no acute consolidative airspace disease and no pulmonary edema. No disproportionately dilated small bowel loops or significant air-fluid levels. Moderate colorectal stool, most prominent in the rectum. No evidence of pneumatosis or pneumoperitoneum. No radiopaque nephrolithiasis. IMPRESSION: 1. Stable mild cardiomegaly, without pulmonary edema. No active pulmonary disease. 2. Nonobstructive bowel gas pattern. 3. Moderate colorectal stool, most prominent in the rectum, which may indicate constipation. Electronically Signed   By: Ilona Sorrel M.D.   On: 10/25/2018 10:39    Scheduled Meds: . aspirin EC  81 mg Oral Daily  . atorvastatin  20 mg Oral q1800  . bisacodyl  10 mg Rectal Once  . feeding supplement (ENSURE ENLIVE)  237 mL Oral TID BM  . insulin aspart  0-5 Units Subcutaneous QHS  . insulin aspart  0-9 Units Subcutaneous TID WC  . insulin  glargine  10 Units Subcutaneous QHS  . lactulose  30 g Oral Once  . LORazepam  1 mg Intravenous Once  . metFORMIN  1,000 mg Oral BID WC  . ondansetron  4 mg Oral Q12H  . pantoprazole  40 mg Oral Q supper  . potassium chloride  10 mEq Oral Daily  . sucralfate  1 g Oral TID WC & HS  . warfarin  7.5 mg Oral Once per day on Mon Fri   And  . [START ON 10/27/2018] warfarin  5 mg Oral Once per day on Sun Tue Wed Thu Sat  . Warfarin - Physician Dosing Inpatient   Does not apply q1800    Admission status: Observation  Time spent: 20 min  [facility] Pager [phone]. If 7PM-7AM, please contact night-coverage at [email or web address], Office  [phone]  password [email or web address]  10/26/2018, 3:14 PM  LOS: 0 days

## 2018-10-27 DIAGNOSIS — R5383 Other fatigue: Secondary | ICD-10-CM | POA: Diagnosis not present

## 2018-10-27 DIAGNOSIS — E1165 Type 2 diabetes mellitus with hyperglycemia: Secondary | ICD-10-CM | POA: Diagnosis not present

## 2018-10-27 DIAGNOSIS — I69315 Cognitive social or emotional deficit following cerebral infarction: Secondary | ICD-10-CM

## 2018-10-27 DIAGNOSIS — R531 Weakness: Secondary | ICD-10-CM | POA: Diagnosis not present

## 2018-10-27 DIAGNOSIS — I10 Essential (primary) hypertension: Secondary | ICD-10-CM | POA: Diagnosis not present

## 2018-10-27 LAB — GLUCOSE, CAPILLARY
GLUCOSE-CAPILLARY: 209 mg/dL — AB (ref 70–99)
Glucose-Capillary: 162 mg/dL — ABNORMAL HIGH (ref 70–99)
Glucose-Capillary: 207 mg/dL — ABNORMAL HIGH (ref 70–99)
Glucose-Capillary: 200 mg/dL — ABNORMAL HIGH (ref 70–99)

## 2018-10-27 LAB — RAPID HIV SCREEN (HIV 1/2 AB+AG)
HIV 1/2 Antibodies: NONREACTIVE
HIV-1 P24 Antigen - HIV24: NONREACTIVE

## 2018-10-27 LAB — PROTIME-INR
INR: 1.93
Prothrombin Time: 21.8 seconds — ABNORMAL HIGH (ref 11.4–15.2)

## 2018-10-27 MED ORDER — WARFARIN SODIUM 5 MG PO TABS: 7.5000 mg | ORAL_TABLET | ORAL | Status: DC

## 2018-10-27 MED ORDER — WARFARIN SODIUM 5 MG PO TABS: 5.0000 mg | ORAL_TABLET | ORAL | Status: DC

## 2018-10-27 MED ORDER — WARFARIN - PHARMACIST DOSING INPATIENT: Freq: Every day | Status: DC

## 2018-10-27 NOTE — Care Management Obs Status (Signed)
[facility] NOTIFICATION   Patient Details  Name: Kristen Lozano MRN: [id number] Date of Birth: 27-Sep-1956   Medicare Observation Status Notification Given:  Yes  Pt unable to sign due to confusion. Husband not at bedside. Copy left in room.  Lynnell Catalan, RN 10/27/2018, 2:09 PM

## 2018-10-27 NOTE — Progress Notes (Signed)
ANTICOAGULATION CONSULT NOTE - Initial Consult  Pharmacy Consult for Warfarin Indication: h/o LV thrombus  No Known Allergies  Patient Measurements: Height: 5\' 3"  (160 cm) Weight: 101 lb 6.6 oz (46 kg) IBW/kg (Calculated) : 52.4  Vital Signs: Temp: 97.7 F (36.5 C) (12/03 0513) Temp Source: Oral (12/03 0513) BP: 122/63 (12/03 0513) Pulse Rate: 89 (12/03 0513)  Labs: Recent Labs    10/25/18 1053 10/26/18 0515 10/27/18 0514  HGB 16.4* 14.2  --   HCT 51.4* 43.9  --   PLT 338 278  --   LABPROT 26.0*  --  21.8*  INR 2.42  --  1.93  CREATININE 0.91  --   --     Estimated Creatinine Clearance: 46 mL/min (by C-G formula based on SCr of 0.91 mg/dL).   Medical History: Past Medical History:  Diagnosis Date  . Acute combined systolic and diastolic HF (heart failure) ([place]) 10/16/2018  . Allergic rhinitis    Requires cetirizine, singulair, and fluticasone.  . Anxiety    Has been on Xanax since 2009. Uses it for stress, anxiety, and insomnia. No contract yet.  . Arthritis   . CHF (congestive heart failure) (HCC)    EF 35% after stroke, presumed ischemic  . Chronic pain    Has OA of knees B. No Xrays in echart. Not requiring narcotics.  . Colitis 12/2017  . Depression   . Diabetes mellitus    Type 2, non insulin dependent. Was dx'd prior to 2008.  Marland Kitchen Hyperglycemia   . Hyperlipemia   . Hypertension   . Sickle cell trait ([place])   . Stroke [facility]) 09/05/14   Dominant left MCA infarcts secondary to unknown embolic source     Medications:  Warfarin 5 mg daily except 7.5 mg on Monday and Friday  Assessment: 62 y/o F with h/o CVA and LV thrombus on chronic warfarin admitted for AMS.   10/27/18  INR 1.93 down from 2.42 on admission   No dose 12/1 and may have missed doses PTA  No significant DDI  No reported bleeding   CBC WNL  Goal of Therapy:  INR 2-3   Plan:   Will continue PTA warfarin dosing for now  Daily INR  Monitor for s/s of bleeding  Ulice Dash D 10/27/2018,12:52 PM

## 2018-10-27 NOTE — Progress Notes (Signed)
[facility]  PROGRESS NOTE  BIBI ECONOMOS WNU:[id number] DOB: 1956/01/03 DOA: 10/25/2018 PCP: Audley Hose, MD   Brief HPI:   62 year old female with history of hypertension, CHF, type 2 diabetes mellitus, hyperlipidemia, hypertension prior CVA was brought to hospital with generalized weakness and poor p.o. intake also has been confused.  Patient recently had cardiac catheterization on 10/16/2018, with no significant coronary obstructive disease.  EF 20 to 25% on recent echo , also has history of left ventricular thrombus on anticoagulation.    Subjective   Patient seen and examined, continues to be confused, she had stroke in 2015 which left her aphasic.    Assessment/Plan:     1. *Altered mental status/aphasia-patient has been confused for past few weeks as per her husband.  Not sure about her baseline, she did have history of nonhemorrhagic CVAs in 2015.?  Vascular dementia.  MRI brain showed no acute abnormality.  TSH 2.020, B12 level 876, will check RPR, vitamin B-1 level.  Will obtain neurology consultation to evaluate for underlying dementia.  2. Anorexia/failure to thrive-patient has had poor p.o. intake for past few weeks.  She is constipated.  Started on MiraLAX  3. Chronic systolic heart failure-last EF from echo was 20 to 25%, currently she is euvolemic.  Continue metoprolol, losartan.  Lasix is currently on hold  4. Diabetes mellitus type 2-continue sliding scale insulin with NovoLog.  Lantus 10 units subcu daily, blood glucose is controlled  5. History of left ventricular thrombus-continue warfarin     CBG: Recent Labs  Lab 10/26/18 1203 10/26/18 1615 10/26/18 2159 10/27/18 0748 10/27/18 1154  GLUCAP 278* 199* 287* 162* 207*    CBC: Recent Labs  Lab 10/25/18 1053 10/26/18 0515  WBC 8.0 10.7*  NEUTROABS 5.2  --   HGB 16.4* 14.2  HCT 51.4* 43.9  MCV 66.0* 67.4*  PLT 338 034    Basic Metabolic Panel: Recent Labs  Lab 10/25/18 1053  NA  134*  K 3.6  CL 91*  CO2 28  GLUCOSE 158*  BUN 23  CREATININE 0.91  CALCIUM 9.8     DVT prophylaxis: Warfarin  Code Status: Full code  Family Communication: Discussed with husband at bedside  Disposition Plan: Patient husband wants to take her home with PT   Consultants:    Procedures:     Antibiotics:   Anti-infectives (From admission, onward)   None       Objective   Vitals:   10/26/18 0511 10/26/18 1353 10/26/18 2158 10/27/18 0513  BP: 134/70 133/66 (!) 86/53 122/63  Pulse: 82 (!) 128 (!) 109 89  Resp: 14 16 16 17   Temp: 97.9 F (36.6 C) 97.8 F (36.6 C)  97.7 F (36.5 C)  TempSrc: Oral Oral  Oral  SpO2: 98% 97% 96% 97%  Weight:      Height:        Intake/Output Summary (Last 24 hours) at 10/27/2018 1218 Last data filed at 10/27/2018 0400 Gross per 24 hour  Intake 1952.31 ml  Output -  Net 1952.31 ml   Filed Weights   10/25/18 0947  Weight: 46 kg     Physical Examination:     General: Appears in no acute distress  Cardiovascular: S1-S2, regular, no murmur auscultated  Respiratory: Clear to auscultation bilaterally  Abdomen: Soft, nontender, no organomegaly  Musculoskeletal: No edema of the lower extremities  Neurological-alert, not oriented x3, confused at baseline     Data Reviewed: I have personally reviewed following labs and  imaging studies   Recent Results (from the past 240 hour(s))  Urine culture     Status: None   Collection Time: 10/25/18 10:53 AM  Result Value Ref Range Status   Specimen Description   Final    URINE, CATHETERIZED Performed at [facility] [address] [place], [place] [address]    Special Requests   Final    Normal Performed at [facility], [place] [address], [place], [place] [address]    Culture   Final    NO GROWTH Performed at [facility], [place] [address], [place],  [address]    Report Status 10/26/2018 FINAL  Final     Liver  Function Tests: Recent Labs  Lab 10/25/18 1053  AST 50*  ALT 61*  ALKPHOS 73  BILITOT 1.3*  PROT 8.7*  ALBUMIN 4.3   No results for input(s): LIPASE, AMYLASE in the last 168 hours. No results for input(s): AMMONIA in the last 168 hours.  Cardiac Enzymes: No results for input(s): CKTOTAL, CKMB, CKMBINDEX, TROPONINI in the last 168 hours. BNP (last 3 results) Recent Labs    10/11/18 2051 10/19/18 2051 10/25/18 1053  BNP 957.6* 495.6* 267.6*    ProBNP (last 3 results) No results for input(s): PROBNP in the last 8760 hours.    Studies: Mr Brain Wo Contrast  Result Date: 10/26/2018 CLINICAL DATA:  Altered level of consciousness. Generalized weakness and confusion. History of stroke, hypertension, hyperlipidemia, diabetes. EXAM: MRI HEAD WITHOUT CONTRAST TECHNIQUE: Multiplanar, multiecho pulse sequences of the brain and surrounding structures were obtained without intravenous contrast. COMPARISON:  CT HEAD October 25, 2018 and MRI of the head September 05, 2014. FINDINGS: INTRACRANIAL CONTENTS: No reduced diffusion to suggest acute ischemia. No susceptibility artifact to suggest hemorrhage. Old LEFT basal ganglia infarct with ex vacuo dilatation LEFT lateral ventricle. Mild global parenchymal brain volume loss. No hydrocephalus. LEFT temporoparietal and RIGHT parietal encephalomalacia. Old small bilateral cerebellar infarcts. No midline shift, mass effect or masses. Patchy supratentorial white matter FLAIR T2 hyperintensities. No abnormal extra-axial fluid collections. VASCULAR: Normal major intracranial vascular flow voids present at skull base. RIGHT ICA is asymmetrically smaller. SKULL AND UPPER CERVICAL SPINE: No abnormal sellar expansion. No suspicious calvarial bone marrow signal. Severe C3-4 spondylosis. Craniocervical junction maintained. SINUSES/ORBITS: The mastoid air-cells and included paranasal sinuses are well-aerated.The included ocular globes and orbital contents are  non-suspicious. Status post RIGHT ocular lens implant. OTHER: Multiple absent teeth. IMPRESSION: 1. No acute intracranial process. 2. Old bilateral MCA territory infarcts. Old small cerebellar infarcts. 3. Mild chronic small vessel ischemic changes. 4. Mild parenchymal brain volume loss, advanced for age. Electronically Signed   By: Elon Alas M.D.   On: 10/26/2018 22:01    Scheduled Meds: . aspirin EC  81 mg Oral Daily  . atorvastatin  20 mg Oral q1800  . bisacodyl  10 mg Rectal Once  . feeding supplement (ENSURE ENLIVE)  237 mL Oral TID BM  . insulin aspart  0-5 Units Subcutaneous QHS  . insulin aspart  0-9 Units Subcutaneous TID WC  . insulin glargine  10 Units Subcutaneous QHS  . lactulose  30 g Oral Once  . losartan  12.5 mg Oral Daily  . metoprolol succinate  100 mg Oral Daily  . metoprolol succinate  50 mg Oral QHS  . ondansetron  4 mg Oral Q12H  . pantoprazole  40 mg Oral Q supper  . polyethylene glycol  17 g Oral Daily  . potassium  chloride  10 mEq Oral Daily  . warfarin  7.5 mg Oral Once per day on Mon Fri   And  . warfarin  5 mg Oral Once per day on Sun Tue Wed Thu Sat  . Warfarin - Physician Dosing Inpatient   Does not apply q1800    Admission status: Observation  Time spent: 20 min  [facility] Pager [phone]. If 7PM-7AM, please contact night-coverage at [email or web address], Office  [phone]  password [email or web address]  10/27/2018, 12:18 PM  LOS: 0 days
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
## 2018-10-28 ENCOUNTER — Ambulatory Visit: Payer: Medicare HMO | Admitting: Physician Assistant

## 2018-10-28 DIAGNOSIS — R531 Weakness: Secondary | ICD-10-CM | POA: Diagnosis not present

## 2018-10-28 DIAGNOSIS — I10 Essential (primary) hypertension: Secondary | ICD-10-CM | POA: Diagnosis not present

## 2018-10-28 DIAGNOSIS — E1165 Type 2 diabetes mellitus with hyperglycemia: Secondary | ICD-10-CM

## 2018-10-28 DIAGNOSIS — I5022 Chronic systolic (congestive) heart failure: Secondary | ICD-10-CM

## 2018-10-28 DIAGNOSIS — I69319 Unspecified symptoms and signs involving cognitive functions following cerebral infarction: Secondary | ICD-10-CM

## 2018-10-28 DIAGNOSIS — R5383 Other fatigue: Secondary | ICD-10-CM | POA: Diagnosis not present

## 2018-10-28 LAB — RPR: RPR Ser Ql: NONREACTIVE

## 2018-10-28 LAB — PROTIME-INR
INR: 1.48
Prothrombin Time: 17.7 seconds — ABNORMAL HIGH (ref 11.4–15.2)

## 2018-10-28 LAB — HIV ANTIBODY (ROUTINE TESTING W REFLEX): HIV Screen 4th Generation wRfx: NONREACTIVE

## 2018-10-28 LAB — GLUCOSE, CAPILLARY: Glucose-Capillary: 275 mg/dL — ABNORMAL HIGH (ref 70–99)

## 2018-10-28 NOTE — Discharge Summary (Addendum)
Physician Discharge Summary  SHANNETTE TABARES NOB:[id number] DOB: Nov 29, 1955 DOA: 10/25/2018  PCP: Audley Hose, MD  Admit date: 10/25/2018 Discharge date: 10/28/2018  Admitted From: Home Disposition: Home  Recommendations for Outpatient Follow-up:  1. Follow up with PCP in 1-2 weeks 2. Follow up with Neurology as an outpatient and have formal Neuro-Psych Testing 3. Please obtain CMP/CBC, Mag, Phos in one week 4. Please follow up on the following pending results:  Home Health: No Equipment/Devices: None    Discharge Condition: Stable CODE STATUS: FULL CODE Diet recommendation: Heart Healthy Diet  Brief/Interim Summary: 62 year old female with history of hypertension, CHF, type 2 diabetes mellitus, hyperlipidemia, hypertension prior CVA was brought to hospital with generalized weakness and poor p.o. intake also has been confused.  Patient recently had cardiac catheterization on 10/16/2018, with no significant coronary obstructive disease.  EF 20 to 25% on recent echo , also has history of left ventricular thrombus on anticoagulation neuro was consulted for her confusion and recommended outpatient neuropsych testing and felt that she could be discharged and follow-up within 4 to 6 weeks.  Patient remained stable and was ablating fine without assistance.  She will need to follow-up with PCP within 1 week.  Her appetite actually improved her husband and he states that she is more back to baseline.Marland Kitchen  Discharge Diagnoses:  Principal Problem:   Fatigue Active Problems:   Essential hypertension   Cerebral infarction [facility])   Cognitive deficit due to recent stroke   Uncontrolled type 2 diabetes mellitus with hyperglycemia (HCC)   General weakness  Altered mental status/aphasia, stable -Patient has been confused for past few weeks as per her husband.   -Not sure about her baseline, she did have history of nonhemorrhagic CVAs in 2015.?  Vascular dementia.   -MRI brain showed no acute  abnormality.   -TSH 2.020, B12 level 876, checked RPR and was Negative, vitamin B-1 level never drawn. B12 Level WNL -HIV was Negative -Neurology consultatied to evaluate for underlying dementia and feels like her Neuro-cognitive decline is multifactorial -Dr. Rory Percy recommeded decreasing Xanax (was started this admission and will not be discharged on it) and considering Neuropsych testing as an outpatient -Neuro recommending outpatient Neurology Follow up in 4-6 Weeks -U/A was Negative -No need for PT as patient is ambulating well without help  Anorexia/failure to thrive -Patient has had poor p.o. intake for past few weeks. And now improved   -She was constipated.  Started on MiraLAX  Chronic systolic heart failure -last EF from echo was 20 to 25%, currently she is euvolemic.   -Continue Metoprolol, Losartan.  Lasix was currently on hold but ok to resume at D/C -Follow up with Cardiology as an outpatient   Diabetes Mellitus Type 2 -Resume Home Metformin 1000 mg po BID -Continued sliding scale insulin with NovoLog and Lantus 10 units subcu daily while hospitalized, -Blood glucose is controlled  History of Left Ventricular Thrombus -Continue Home Warfarin  Discharge Instructions  Discharge Instructions    Ambulatory referral to Neurology   Complete by:  As directed    An appointment is requested in approximately: 4-6 weeks   Call MD for:  difficulty breathing, headache or visual disturbances   Complete by:  As directed    Call MD for:  extreme fatigue   Complete by:  As directed    Call MD for:  hives   Complete by:  As directed    Call MD for:  persistant dizziness or light-headedness   Complete by:  As directed    Call MD for:  persistant nausea and vomiting   Complete by:  As directed    Call MD for:  redness, tenderness, or signs of infection (pain, swelling, redness, odor or green/yellow discharge around incision site)   Complete by:  As directed    Call MD for:   severe uncontrolled pain   Complete by:  As directed    Call MD for:  temperature >100.4   Complete by:  As directed    Diet - low sodium heart healthy   Complete by:  As directed    Diet Carb Modified   Complete by:  As directed    Discharge instructions   Complete by:  As directed    You were cared for by a hospitalist during your hospital stay. If you have any questions about your discharge medications or the care you received while you were in the hospital after you are discharged, you can call the unit and ask to speak with the hospitalist on call if the hospitalist that took care of you is not available. Once you are discharged, your primary care physician will handle any further medical issues. Please note that NO REFILLS for any discharge medications will be authorized once you are discharged, as it is imperative that you return to your primary care physician (or establish a relationship with a primary care physician if you do not have one) for your aftercare needs so that they can reassess your need for medications and monitor your lab values.  Follow up with PCP, Cardiology, and Neurology as an outpatient. Take all medications as prescribed. If symptoms change or worsen please return to the ED for evaluation   Increase activity slowly   Complete by:  As directed      Allergies as of 10/28/2018   No Known Allergies     Medication List    TAKE these medications   ASPIRIN LOW DOSE 81 MG EC tablet Generic drug:  aspirin Take 1 tablet (81 mg total) by mouth daily. What changed:  See the new instructions.   atorvastatin 20 MG tablet Commonly known as:  LIPITOR Take 1 tablet (20 mg total) by mouth daily at 6 PM. Please schedule appointment for refills.   docusate sodium 100 MG capsule Commonly known as:  COLACE Take 1 capsule (100 mg total) by mouth 2 (two) times daily as needed for mild constipation.   feeding supplement (ENSURE ENLIVE) Liqd Take 237 mLs by mouth 3 (three)  times daily between meals.   furosemide 40 MG tablet Commonly known as:  LASIX Take 1 tablet (40 mg total) by mouth daily.   guaiFENesin 100 MG/5ML Soln Commonly known as:  ROBITUSSIN Take 15 mLs (300 mg total) by mouth 4 (four) times daily.   insulin glargine 100 UNIT/ML injection Commonly known as:  LANTUS Inject 0.1 mLs (10 Units total) into the skin at bedtime.   ivabradine 5 MG Tabs tablet Commonly known as:  CORLANOR Take 1 tablet (5 mg total) by mouth 2 (two) times daily with a meal.   losartan 25 MG tablet Commonly known as:  COZAAR Take 0.5 tablets (12.5 mg total) by mouth daily.   metFORMIN 500 MG 24 hr tablet Commonly known as:  GLUCOPHAGE-XR Take 2 tablets (1,000 mg total) by mouth 2 (two) times daily.   metoprolol succinate 50 MG 24 hr tablet Commonly known as:  TOPROL-XL Take 1 tablet (50 mg total) by mouth at bedtime. Take with or immediately  following a meal.   metoprolol succinate 100 MG 24 hr tablet Commonly known as:  TOPROL-XL Take 1 tablet (100 mg total) by mouth daily. Take with or immediately following a meal.  In the Morning   ondansetron 4 MG tablet Commonly known as:  ZOFRAN Take 1 tablet (4 mg total) by mouth every 12 (twelve) hours.   pantoprazole 40 MG tablet Commonly known as:  PROTONIX Take 1 tablet (40 mg total) by mouth daily with supper.   polyethylene glycol packet Commonly known as:  MIRALAX / GLYCOLAX Take 17 g by mouth daily as needed.   potassium chloride 10 MEQ tablet Commonly known as:  K-DUR Take 1 tablet (10 mEq total) by mouth daily.   sucralfate 1 GM/10ML suspension Commonly known as:  CARAFATE Take 10 mLs (1 g total) by mouth 4 (four) times daily -  with meals and at bedtime for 10 days.   warfarin 5 MG tablet Commonly known as:  COUMADIN Take as directed. If you are unsure how to take this medication, talk to your nurse or doctor. Original instructions:  Take 1 to 1.5 tablets by mouth daily as directed by coumadin  clinic  Take 1.5 pills on Mondays and Fridays and 1 pill Tuesday, Wed. THursday, Saturday and Sunday       No Known Allergies  Consultations:  Neurology  Procedures/Studies: Ct Head Wo Contrast  Result Date: 10/25/2018 CLINICAL DATA:  Altered level of consciousness. EXAM: CT HEAD WITHOUT CONTRAST TECHNIQUE: Contiguous axial images were obtained from the base of the skull through the vertex without intravenous contrast. COMPARISON:  10/11/2018. FINDINGS: Brain: No evidence of acute infarction, hemorrhage, hydrocephalus, extra-axial collection or mass lesion/mass effect. There is mild diffuse low-attenuation within the subcortical and periventricular white matter compatible with chronic microvascular disease. Chronic left basal ganglia infarct with ex vacuo dilatation of the anterior horn of left lateral ventricle is again noted. Additional chronic infarcts involve the left parietal lobe, left occipital lobe, and right posterior parietal lobe. Vascular: No hyperdense vessel or unexpected calcification. Skull: Normal. Negative for fracture or focal lesion. Sinuses/Orbits: No acute finding. Other: None. IMPRESSION: 1. No acute intracranial abnormality. 2. Chronic small vessel ischemic disease with chronic bilateral parietal, left occipital and left basal ganglia infarcts. Electronically Signed   By: Kerby Moors M.D.   On: 10/25/2018 10:48   Ct Head Wo Contrast  Result Date: 10/11/2018 CLINICAL DATA:  Altered mental status EXAM: CT HEAD WITHOUT CONTRAST TECHNIQUE: Contiguous axial images were obtained from the base of the skull through the vertex without intravenous contrast. COMPARISON:  Head CT 09/04/2014 FINDINGS: Brain: There is no mass, hemorrhage or extra-axial collection. There is generalized atrophy without lobar predilection. There are nonacute infarcts of both parietal lobes, likely chronic, but new since 04/01/2014. There is an old left basal ganglia infarct. There is periventricular  hypoattenuation compatible with chronic microvascular disease. Vascular: No abnormal hyperdensity of the major intracranial arteries or dural venous sinuses. No intracranial atherosclerosis. Skull: The visualized skull base, calvarium and extracranial soft tissues are normal. Sinuses/Orbits: No fluid levels or advanced mucosal thickening of the visualized paranasal sinuses. No mastoid or middle ear effusion. The orbits are normal. IMPRESSION: 1. No acute intracranial abnormality. 2. Multiple old infarcts. 3. Atrophy and chronic ischemic microangiopathy. Electronically Signed   By: Ulyses Jarred M.D.   On: 10/11/2018 23:55   Ct Angio Chest Pe W Or Wo Contrast  Result Date: 10/12/2018 CLINICAL DATA:  Nausea and vomiting.  Cough and runny nose.  EXAM: CT ANGIOGRAPHY CHEST CT ABDOMEN AND PELVIS WITH CONTRAST TECHNIQUE: Multidetector CT imaging of the chest was performed using the standard protocol during bolus administration of intravenous contrast. Multiplanar CT image reconstructions and MIPs were obtained to evaluate the vascular anatomy. Multidetector CT imaging of the abdomen and pelvis was performed using the standard protocol during bolus administration of intravenous contrast. CONTRAST:  156m ISOVUE-370 IOPAMIDOL (ISOVUE-370) INJECTION 76% COMPARISON:  CT of the chest October 28, 2016 and CT the abdomen and pelvis September 01, 2018 FINDINGS: CTA CHEST FINDINGS Cardiovascular: There is mild atherosclerotic change in the thoracic aortic arch. No aneurysm or obvious dissection. Cardiomegaly. No pulmonary emboli. Mediastinum/Nodes: Moderate bilateral pleural effusions. No adenopathy or other abnormality. Lungs/Pleura: Central airways are normal. No pneumothorax. Moderate bilateral pleural effusions with associated atelectasis. No suspicious nodules or masses. No other infiltrates. Musculoskeletal: See below Review of the MIP images confirms the above findings. CT ABDOMEN and PELVIS FINDINGS Hepatobiliary:  Heterogeneous attenuation liver consistent with a not mega liver suggesting elevated right heart pressures. No suspicious mass. Limited views of the gallbladder are unremarkable. The main pulmonary vein is well opacified. Pancreas: Unremarkable. No pancreatic ductal dilatation or surrounding inflammatory changes. Spleen: Normal in size without focal abnormality. Adrenals/Urinary Tract: Evaluation limited due to motion. Regions of cortical thinning in the kidneys, likely from previous infarcts are infections. No obvious mass, hydronephrosis, or perinephric stranding. No ureteral stones noted. The bladder is unremarkable. Stomach/Bowel: The stomach and small bowel are unremarkable. The colon is unremarkable. The appendix is not well assessed but there is no secondary evidence of appendicitis. Vascular/Lymphatic: No aneurysmal dilatation or dissection noted. No adenopathy. Reproductive: Uterus and bilateral adnexa are unremarkable. Other: Increased attenuation diffusely throughout the subcutaneous fat, likely from volume overload. Musculoskeletal: No acute or significant osseous findings. Review of the MIP images confirms the above findings. IMPRESSION: 1. No pulmonary emboli. 2. Moderate bilateral pleural effusions with underlying atelectasis. 3. Atherosclerotic change in the aorta. 4. Heterogeneous nutmeg appearance to the liver, likely due to vascular congestion from elevated right heart pressures. 5. Evaluation of the remainder of the abdomen is limited due to motion. No other acute abnormalities. Electronically Signed   By: DDorise BullionIII M.D   On: 10/12/2018 00:16   Mr Brain Wo Contrast  Result Date: 10/26/2018 CLINICAL DATA:  Altered level of consciousness. Generalized weakness and confusion. History of stroke, hypertension, hyperlipidemia, diabetes. EXAM: MRI HEAD WITHOUT CONTRAST TECHNIQUE: Multiplanar, multiecho pulse sequences of the brain and surrounding structures were obtained without intravenous  contrast. COMPARISON:  CT HEAD October 25, 2018 and MRI of the head September 05, 2014. FINDINGS: INTRACRANIAL CONTENTS: No reduced diffusion to suggest acute ischemia. No susceptibility artifact to suggest hemorrhage. Old LEFT basal ganglia infarct with ex vacuo dilatation LEFT lateral ventricle. Mild global parenchymal brain volume loss. No hydrocephalus. LEFT temporoparietal and RIGHT parietal encephalomalacia. Old small bilateral cerebellar infarcts. No midline shift, mass effect or masses. Patchy supratentorial white matter FLAIR T2 hyperintensities. No abnormal extra-axial fluid collections. VASCULAR: Normal major intracranial vascular flow voids present at skull base. RIGHT ICA is asymmetrically smaller. SKULL AND UPPER CERVICAL SPINE: No abnormal sellar expansion. No suspicious calvarial bone marrow signal. Severe C3-4 spondylosis. Craniocervical junction maintained. SINUSES/ORBITS: The mastoid air-cells and included paranasal sinuses are well-aerated.The included ocular globes and orbital contents are non-suspicious. Status post RIGHT ocular lens implant. OTHER: Multiple absent teeth. IMPRESSION: 1. No acute intracranial process. 2. Old bilateral MCA territory infarcts. Old small cerebellar infarcts. 3. Mild chronic small vessel  ischemic changes. 4. Mild parenchymal brain volume loss, advanced for age. Electronically Signed   By: Elon Alas M.D.   On: 10/26/2018 22:01   Ct Abdomen Pelvis W Contrast  Result Date: 10/12/2018 CLINICAL DATA:  Nausea and vomiting.  Cough and runny nose. EXAM: CT ANGIOGRAPHY CHEST CT ABDOMEN AND PELVIS WITH CONTRAST TECHNIQUE: Multidetector CT imaging of the chest was performed using the standard protocol during bolus administration of intravenous contrast. Multiplanar CT image reconstructions and MIPs were obtained to evaluate the vascular anatomy. Multidetector CT imaging of the abdomen and pelvis was performed using the standard protocol during bolus administration  of intravenous contrast. CONTRAST:  130m ISOVUE-370 IOPAMIDOL (ISOVUE-370) INJECTION 76% COMPARISON:  CT of the chest October 28, 2016 and CT the abdomen and pelvis September 01, 2018 FINDINGS: CTA CHEST FINDINGS Cardiovascular: There is mild atherosclerotic change in the thoracic aortic arch. No aneurysm or obvious dissection. Cardiomegaly. No pulmonary emboli. Mediastinum/Nodes: Moderate bilateral pleural effusions. No adenopathy or other abnormality. Lungs/Pleura: Central airways are normal. No pneumothorax. Moderate bilateral pleural effusions with associated atelectasis. No suspicious nodules or masses. No other infiltrates. Musculoskeletal: See below Review of the MIP images confirms the above findings. CT ABDOMEN and PELVIS FINDINGS Hepatobiliary: Heterogeneous attenuation liver consistent with a not mega liver suggesting elevated right heart pressures. No suspicious mass. Limited views of the gallbladder are unremarkable. The main pulmonary vein is well opacified. Pancreas: Unremarkable. No pancreatic ductal dilatation or surrounding inflammatory changes. Spleen: Normal in size without focal abnormality. Adrenals/Urinary Tract: Evaluation limited due to motion. Regions of cortical thinning in the kidneys, likely from previous infarcts are infections. No obvious mass, hydronephrosis, or perinephric stranding. No ureteral stones noted. The bladder is unremarkable. Stomach/Bowel: The stomach and small bowel are unremarkable. The colon is unremarkable. The appendix is not well assessed but there is no secondary evidence of appendicitis. Vascular/Lymphatic: No aneurysmal dilatation or dissection noted. No adenopathy. Reproductive: Uterus and bilateral adnexa are unremarkable. Other: Increased attenuation diffusely throughout the subcutaneous fat, likely from volume overload. Musculoskeletal: No acute or significant osseous findings. Review of the MIP images confirms the above findings. IMPRESSION: 1. No pulmonary  emboli. 2. Moderate bilateral pleural effusions with underlying atelectasis. 3. Atherosclerotic change in the aorta. 4. Heterogeneous nutmeg appearance to the liver, likely due to vascular congestion from elevated right heart pressures. 5. Evaluation of the remainder of the abdomen is limited due to motion. No other acute abnormalities. Electronically Signed   By: DDorise BullionIII M.D   On: 10/12/2018 00:16   Dg Abdomen Acute W/chest  Result Date: 10/25/2018 CLINICAL DATA:  Vomiting EXAM: DG ABDOMEN ACUTE W/ 1V CHEST COMPARISON:  10/11/2018 CT abdomen/pelvis. 09/01/2018 chest radiograph. FINDINGS: Stable cardiomediastinal silhouette with mild cardiomegaly. No pneumothorax. No pleural effusion. Lungs appear clear, with no acute consolidative airspace disease and no pulmonary edema. No disproportionately dilated small bowel loops or significant air-fluid levels. Moderate colorectal stool, most prominent in the rectum. No evidence of pneumatosis or pneumoperitoneum. No radiopaque nephrolithiasis. IMPRESSION: 1. Stable mild cardiomegaly, without pulmonary edema. No active pulmonary disease. 2. Nonobstructive bowel gas pattern. 3. Moderate colorectal stool, most prominent in the rectum, which may indicate constipation. Electronically Signed   By: JIlona SorrelM.D.   On: 10/25/2018 10:39    Subjective: Seen and examined at bedside was little agitated this morning and was walking independently.  No chest pain, lightheadedness or dizziness.  Wanted to go home.  No other concerns or plans at this time.  Discharge Exam: Vitals:  10/27/18 1922 10/28/18 0450  BP: 125/73 124/68  Pulse: 92 88  Resp: 16 17  Temp: 98.4 F (36.9 C) 98.1 F (36.7 C)  SpO2: 96% 95%   Vitals:   10/27/18 0513 10/27/18 1317 10/27/18 1922 10/28/18 0450  BP: 122/63 133/82 125/73 124/68  Pulse: 89 (!) 107 92 88  Resp: 17 17 16 17   Temp: 97.7 F (36.5 C) 98.6 F (37 C) 98.4 F (36.9 C) 98.1 F (36.7 C)  TempSrc: Oral Oral  Oral Oral  SpO2: 97% 94% 96% 95%  Weight:      Height:       General: Pt is awake, not in acute distress Cardiovascular: RRR, S1/S2 +, no rubs, no gallops Respiratory: Diminished bilaterally, no wheezing, no rhonchi Abdominal: Soft, NT, ND, bowel sounds + Extremities: no LE appreciated edema, no cyanosis  The results of significant diagnostics from this hospitalization (including imaging, microbiology, ancillary and laboratory) are listed below for reference.    Microbiology: Recent Results (from the past 240 hour(s))  Urine culture     Status: None   Collection Time: 10/25/18 10:53 AM  Result Value Ref Range Status   Specimen Description   Final    URINE, CATHETERIZED Performed at [facility] [address], [place], [place] [address]    Special Requests   Final    Normal Performed at [facility], [place] [address], [place], [place] [address]    Culture   Final    NO GROWTH Performed at [facility], [place] [address] [place], [place] [address]    Report Status 10/26/2018 FINAL  Final     Labs: BNP (last 3 results) Recent Labs    10/11/18 2051 10/19/18 2051 10/25/18 1053  BNP 957.6* 495.6* 811.5*   Basic Metabolic Panel: Recent Labs  Lab 10/25/18 1053  NA 134*  K 3.6  CL 91*  CO2 28  GLUCOSE 158*  BUN 23  CREATININE 0.91  CALCIUM 9.8   Liver Function Tests: Recent Labs  Lab 10/25/18 1053  AST 50*  ALT 61*  ALKPHOS 73  BILITOT 1.3*  PROT 8.7*  ALBUMIN 4.3   No results for input(s): LIPASE, AMYLASE in the last 168 hours. No results for input(s): AMMONIA in the last 168 hours. CBC: Recent Labs  Lab 10/25/18 1053 10/26/18 0515  WBC 8.0 10.7*  NEUTROABS 5.2  --   HGB 16.4* 14.2  HCT 51.4* 43.9  MCV 66.0* 67.4*  PLT 338 278   Cardiac Enzymes: No results for input(s): CKTOTAL, CKMB, CKMBINDEX, TROPONINI in the last 168 hours. BNP: Invalid input(s): POCBNP CBG: Recent Labs  Lab 10/27/18 0748  10/27/18 1154 10/27/18 1634 10/27/18 2135 10/28/18 0742  GLUCAP 162* 207* 209* 200* 275*   D-Dimer No results for input(s): DDIMER in the last 72 hours. Hgb A1c No results for input(s): HGBA1C in the last 72 hours. Lipid Profile No results for input(s): CHOL, HDL, LDLCALC, TRIG, CHOLHDL, LDLDIRECT in the last 72 hours. Thyroid function studies Recent Labs    10/26/18 1103  TSH 2.020   Anemia work up Recent Labs    10/26/18 1103  VITAMINB12 876   Urinalysis    Component Value Date/Time   COLORURINE YELLOW 10/25/2018 [place] 10/25/2018 1053   LABSPEC 1.020 10/25/2018 1053   PHURINE 5.0 10/25/2018 1053   GLUCOSEU 50 (A) 10/25/2018 1053   HGBUR NEGATIVE 10/25/2018 [place] 10/25/2018 1053   KETONESUR 20 (A) 10/25/2018  [place] (A) 10/25/2018 1053   UROBILINOGEN 1.0 09/07/2014 1405   NITRITE NEGATIVE 10/25/2018 1053   LEUKOCYTESUR NEGATIVE 10/25/2018 1053   Sepsis Labs Invalid input(s): PROCALCITONIN,  WBC,  LACTICIDVEN Microbiology Recent Results (from the past 240 hour(s))  Urine culture     Status: None   Collection Time: 10/25/18 10:53 AM  Result Value Ref Range Status   Specimen Description   Final    URINE, CATHETERIZED Performed at [facility], [place] [address] [place], [place] [address]    Special Requests   Final    Normal Performed at [facility], [place] [address] [place], [place] [address]    Culture   Final    NO GROWTH Performed at [facility], [place] [address], [place], [place] [address]    Report Status 10/26/2018 FINAL  Final   Time coordinating discharge: 35 minutes  SIGNED:  Kerney Elbe, DO [facility] 10/28/2018, 7:06 PM Pager is on [place]  If 7PM-7AM, please contact night-coverage [email or web address] Password [email or web address]

## 2018-10-28 NOTE — Progress Notes (Signed)
Went over discharge information with husband and current medications. Patient discharged in the care of husband.

## 2018-10-29 LAB — VITAMIN B1: Vitamin B1 (Thiamine): 70.8 nmol/L (ref 66.5–200.0)

## 2018-11-03 NOTE — Telephone Encounter (Addendum)
Patient missed 10/28/18 OV with Isaac Laud PA.   Received another PA request for corlanor and attempted PA which was not able to process as this med was denied in the last 60 days. MD needs to determine if an appeal is necessary.   Patient does not have a scheduled follow up until Feb 2020  PA phone # [phone]

## 2018-11-05 NOTE — Telephone Encounter (Signed)
Will address reason for denial at follow-up.  Dr. Lemmie Evens

## 2018-11-05 NOTE — Telephone Encounter (Signed)
Noted  

## 2018-11-11 ENCOUNTER — Ambulatory Visit: Payer: Medicare HMO | Admitting: Gastroenterology

## 2018-11-13 ENCOUNTER — Ambulatory Visit (INDEPENDENT_AMBULATORY_CARE_PROVIDER_SITE_OTHER): Payer: Medicare HMO

## 2018-11-13 DIAGNOSIS — Z7901 Long term (current) use of anticoagulants: Secondary | ICD-10-CM

## 2018-11-13 DIAGNOSIS — Z5181 Encounter for therapeutic drug level monitoring: Secondary | ICD-10-CM | POA: Diagnosis not present

## 2018-11-13 DIAGNOSIS — I236 Thrombosis of atrium, auricular appendage, and ventricle as current complications following acute myocardial infarction: Secondary | ICD-10-CM | POA: Diagnosis not present

## 2018-11-13 LAB — POCT INR: INR: 3.5 — AB (ref 2.0–3.0)

## 2018-11-13 NOTE — Patient Instructions (Signed)
Description   Skip today's dosage of Coumadin, then resume same dosage 1 pill everyday except 1.5 pills on Mondays and Fridays.  Recheck in 2 weeks.  Eat Green vegetables 2 times a week.  Coumadin Clinic [phone] Main [phone]

## 2018-11-21 ENCOUNTER — Other Ambulatory Visit: Payer: Self-pay

## 2018-11-21 ENCOUNTER — Inpatient Hospital Stay (HOSPITAL_COMMUNITY)
Admission: EM | Admit: 2018-11-21 | Discharge: 2018-11-24 | DRG: 292 | Disposition: A | Discharge status: Home / Self Care | Payer: Medicare HMO | Attending: Internal Medicine | Admitting: Internal Medicine

## 2018-11-21 ENCOUNTER — Emergency Department (HOSPITAL_COMMUNITY): Payer: Medicare HMO

## 2018-11-21 DIAGNOSIS — D573 Sickle-cell trait: Secondary | ICD-10-CM | POA: Diagnosis present

## 2018-11-21 DIAGNOSIS — I69319 Unspecified symptoms and signs involving cognitive functions following cerebral infarction: Secondary | ICD-10-CM

## 2018-11-21 DIAGNOSIS — R1314 Dysphagia, pharyngoesophageal phase: Secondary | ICD-10-CM | POA: Diagnosis present

## 2018-11-21 DIAGNOSIS — Z832 Family history of diseases of the blood and blood-forming organs and certain disorders involving the immune mechanism: Secondary | ICD-10-CM

## 2018-11-21 DIAGNOSIS — Z7982 Long term (current) use of aspirin: Secondary | ICD-10-CM

## 2018-11-21 DIAGNOSIS — R1084 Generalized abdominal pain: Secondary | ICD-10-CM | POA: Diagnosis not present

## 2018-11-21 DIAGNOSIS — I428 Other cardiomyopathies: Secondary | ICD-10-CM

## 2018-11-21 DIAGNOSIS — E860 Dehydration: Secondary | ICD-10-CM | POA: Diagnosis present

## 2018-11-21 DIAGNOSIS — Z841 Family history of disorders of kidney and ureter: Secondary | ICD-10-CM

## 2018-11-21 DIAGNOSIS — E440 Moderate protein-calorie malnutrition: Secondary | ICD-10-CM | POA: Diagnosis present

## 2018-11-21 DIAGNOSIS — Z7901 Long term (current) use of anticoagulants: Secondary | ICD-10-CM

## 2018-11-21 DIAGNOSIS — I5042 Chronic combined systolic (congestive) and diastolic (congestive) heart failure: Secondary | ICD-10-CM | POA: Diagnosis present

## 2018-11-21 DIAGNOSIS — K219 Gastro-esophageal reflux disease without esophagitis: Secondary | ICD-10-CM | POA: Diagnosis not present

## 2018-11-21 DIAGNOSIS — Z8249 Family history of ischemic heart disease and other diseases of the circulatory system: Secondary | ICD-10-CM

## 2018-11-21 DIAGNOSIS — R072 Precordial pain: Secondary | ICD-10-CM

## 2018-11-21 DIAGNOSIS — E785 Hyperlipidemia, unspecified: Secondary | ICD-10-CM | POA: Diagnosis present

## 2018-11-21 DIAGNOSIS — K761 Chronic passive congestion of liver: Secondary | ICD-10-CM | POA: Diagnosis present

## 2018-11-21 DIAGNOSIS — R109 Unspecified abdominal pain: Secondary | ICD-10-CM | POA: Diagnosis present

## 2018-11-21 DIAGNOSIS — R000 Tachycardia, unspecified: Secondary | ICD-10-CM | POA: Diagnosis present

## 2018-11-21 DIAGNOSIS — I10 Essential (primary) hypertension: Secondary | ICD-10-CM | POA: Diagnosis not present

## 2018-11-21 DIAGNOSIS — IMO0002 Reserved for concepts with insufficient information to code with codable children: Secondary | ICD-10-CM | POA: Diagnosis present

## 2018-11-21 DIAGNOSIS — E876 Hypokalemia: Secondary | ICD-10-CM | POA: Diagnosis present

## 2018-11-21 DIAGNOSIS — M170 Bilateral primary osteoarthritis of knee: Secondary | ICD-10-CM | POA: Diagnosis present

## 2018-11-21 DIAGNOSIS — E1165 Type 2 diabetes mellitus with hyperglycemia: Secondary | ICD-10-CM | POA: Diagnosis present

## 2018-11-21 DIAGNOSIS — Z794 Long term (current) use of insulin: Secondary | ICD-10-CM

## 2018-11-21 DIAGNOSIS — I429 Cardiomyopathy, unspecified: Secondary | ICD-10-CM

## 2018-11-21 DIAGNOSIS — Z833 Family history of diabetes mellitus: Secondary | ICD-10-CM

## 2018-11-21 DIAGNOSIS — J302 Other seasonal allergic rhinitis: Secondary | ICD-10-CM | POA: Diagnosis present

## 2018-11-21 DIAGNOSIS — R791 Abnormal coagulation profile: Secondary | ICD-10-CM | POA: Diagnosis present

## 2018-11-21 DIAGNOSIS — I236 Thrombosis of atrium, auricular appendage, and ventricle as current complications following acute myocardial infarction: Secondary | ICD-10-CM

## 2018-11-21 DIAGNOSIS — Z800 Family history of malignant neoplasm of digestive organs: Secondary | ICD-10-CM

## 2018-11-21 DIAGNOSIS — E872 Acidosis, unspecified: Secondary | ICD-10-CM

## 2018-11-21 DIAGNOSIS — R112 Nausea with vomiting, unspecified: Secondary | ICD-10-CM | POA: Diagnosis not present

## 2018-11-21 DIAGNOSIS — I110 Hypertensive heart disease with heart failure: Principal | ICD-10-CM | POA: Diagnosis present

## 2018-11-21 DIAGNOSIS — E1159 Type 2 diabetes mellitus with other circulatory complications: Secondary | ICD-10-CM | POA: Diagnosis present

## 2018-11-21 DIAGNOSIS — Z79899 Other long term (current) drug therapy: Secondary | ICD-10-CM

## 2018-11-21 DIAGNOSIS — Z681 Body mass index (BMI) 19 or less, adult: Secondary | ICD-10-CM

## 2018-11-21 DIAGNOSIS — Z8041 Family history of malignant neoplasm of ovary: Secondary | ICD-10-CM

## 2018-11-21 DIAGNOSIS — G8929 Other chronic pain: Secondary | ICD-10-CM | POA: Diagnosis present

## 2018-11-21 DIAGNOSIS — Z818 Family history of other mental and behavioral disorders: Secondary | ICD-10-CM

## 2018-11-21 DIAGNOSIS — I513 Intracardiac thrombosis, not elsewhere classified: Secondary | ICD-10-CM | POA: Diagnosis present

## 2018-11-21 DIAGNOSIS — I252 Old myocardial infarction: Secondary | ICD-10-CM

## 2018-11-21 LAB — COMPREHENSIVE METABOLIC PANEL
ALK PHOS: 60 U/L (ref 38–126)
ALT: 37 U/L (ref 0–44)
AST: 36 U/L (ref 15–41)
Albumin: 4.1 g/dL (ref 3.5–5.0)
Anion gap: 18 — ABNORMAL HIGH (ref 5–15)
BUN: 14 mg/dL (ref 8–23)
CALCIUM: 9.6 mg/dL (ref 8.9–10.3)
CO2: 24 mmol/L (ref 22–32)
Chloride: 97 mmol/L — ABNORMAL LOW (ref 98–111)
Creatinine, Ser: 1.04 mg/dL — ABNORMAL HIGH (ref 0.44–1.00)
GFR calc Af Amer: >60 mL/min (ref >60)
GFR calc non Af Amer: 57 mL/min — ABNORMAL LOW (ref >60)
Glucose, Bld: 240 mg/dL — ABNORMAL HIGH (ref 70–99)
Potassium: 3.1 mmol/L — ABNORMAL LOW (ref 3.5–5.1)
Sodium: 139 mmol/L (ref 135–145)
Total Bilirubin: 1.5 mg/dL — ABNORMAL HIGH (ref 0.3–1.2)
Total Protein: 8.5 g/dL — ABNORMAL HIGH (ref 6.5–8.1)

## 2018-11-21 LAB — INFLUENZA PANEL BY PCR (TYPE A & B)
Influenza A By PCR: NEGATIVE
Influenza B By PCR: NEGATIVE

## 2018-11-21 LAB — GLUCOSE, CAPILLARY
Glucose-Capillary: 236 mg/dL — ABNORMAL HIGH (ref 70–99)
Glucose-Capillary: 260 mg/dL — ABNORMAL HIGH (ref 70–99)

## 2018-11-21 LAB — CBC WITH DIFFERENTIAL/PLATELET
Abs Immature Granulocytes: 0.09 10*3/uL — ABNORMAL HIGH (ref 0.00–0.07)
Basophils Absolute: 0.1 10*3/uL (ref 0.0–0.1)
Basophils Relative: 0 %
EOS PCT: 0 %
Eosinophils Absolute: 0 10*3/uL (ref 0.0–0.5)
HCT: 44.3 % (ref 36.0–46.0)
Hemoglobin: 14.3 g/dL (ref 12.0–15.0)
Immature Granulocytes: 1 %
Lymphocytes Relative: 12 %
Lymphs Abs: 1.5 10*3/uL (ref 0.7–4.0)
MCH: 21.1 pg — ABNORMAL LOW (ref 26.0–34.0)
MCHC: 32.3 g/dL (ref 30.0–36.0)
MCV: 65.4 fL — ABNORMAL LOW (ref 80.0–100.0)
Monocytes Absolute: 1.2 10*3/uL — ABNORMAL HIGH (ref 0.1–1.0)
Monocytes Relative: 10 %
Neutro Abs: 10.1 10*3/uL — ABNORMAL HIGH (ref 1.7–7.7)
Neutrophils Relative %: 77 %
Platelets: 275 10*3/uL (ref 150–400)
RBC: 6.77 MIL/uL — ABNORMAL HIGH (ref 3.87–5.11)
RDW: 17.8 % — ABNORMAL HIGH (ref 11.5–15.5)
WBC: 13 10*3/uL — ABNORMAL HIGH (ref 4.0–10.5)
nRBC: 0.2 % (ref 0.0–0.2)

## 2018-11-21 LAB — I-STAT TROPONIN, ED: Troponin i, poc: 0.02 ng/mL (ref 0.00–0.08)

## 2018-11-21 LAB — LACTIC ACID, PLASMA
Lactic Acid, Venous: 2 mmol/L (ref 0.5–1.9)
Lactic Acid, Venous: 1.7 mmol/L (ref 0.5–1.9)

## 2018-11-21 LAB — CBG MONITORING, ED: Glucose-Capillary: 229 mg/dL — ABNORMAL HIGH (ref 70–99)

## 2018-11-21 LAB — URINALYSIS, ROUTINE W REFLEX MICROSCOPIC
Bilirubin Urine: NEGATIVE
Hgb urine dipstick: NEGATIVE
Ketones, ur: 20 mg/dL — AB
Leukocytes, UA: NEGATIVE
Nitrite: NEGATIVE
PH: 5 (ref 5.0–8.0)
Protein, ur: 100 mg/dL — AB
Specific Gravity, Urine: 1.024 (ref 1.005–1.030)

## 2018-11-21 LAB — I-STAT CG4 LACTIC ACID, ED
LACTIC ACID, VENOUS: 3.16 mmol/L — AB (ref 0.5–1.9)
Lactic Acid, Venous: 2.22 mmol/L (ref 0.5–1.9)

## 2018-11-21 LAB — D-DIMER, QUANTITATIVE: D-Dimer, Quant: <0.27 ug/mL-FEU (ref 0.00–0.50)

## 2018-11-21 LAB — PROTIME-INR
INR: 1.64
Prothrombin Time: 19.2 seconds — ABNORMAL HIGH (ref 11.4–15.2)

## 2018-11-21 LAB — LIPASE, BLOOD: Lipase: 28 U/L (ref 11–51)

## 2018-11-21 LAB — TSH: TSH: 1.731 u[IU]/mL (ref 0.350–4.500)

## 2018-11-21 MED ORDER — ENSURE ENLIVE PO LIQD
237.0000 mL | Freq: Three times a day (TID) | ORAL | Status: DC
  Administered 2018-11-21 – 2018-11-24 (×6): 237 mL via ORAL
  Filled 2018-11-21: qty 237

## 2018-11-21 MED ORDER — METRONIDAZOLE IN NACL 5-0.79 MG/ML-% IV SOLN
500.0000 mg | Freq: Three times a day (TID) | INTRAVENOUS | Status: DC
  Filled 2018-11-21: qty 100

## 2018-11-21 MED ORDER — METOPROLOL SUCCINATE ER 50 MG PO TB24: 50.0000 mg | ORAL_TABLET | Freq: Every day | ORAL | Status: DC

## 2018-11-21 MED ORDER — SODIUM CHLORIDE 0.9 % IV SOLN
2.0000 g | Freq: Once | INTRAVENOUS | Status: AC
  Administered 2018-11-21: 2 g via INTRAVENOUS
  Filled 2018-11-21: qty 2

## 2018-11-21 MED ORDER — OXYCODONE HCL 5 MG PO TABS: 5.0000 mg | ORAL_TABLET | ORAL | Status: DC | PRN

## 2018-11-21 MED ORDER — METOPROLOL TARTRATE 5 MG/5ML IV SOLN
5.0000 mg | Freq: Three times a day (TID) | INTRAVENOUS | Status: DC | PRN
  Administered 2018-11-21 – 2018-11-22 (×2): 5 mg via INTRAVENOUS
  Filled 2018-11-21 (×2): qty 5

## 2018-11-21 MED ORDER — ATORVASTATIN CALCIUM 10 MG PO TABS
20.0000 mg | ORAL_TABLET | Freq: Every day | ORAL | Status: DC
  Administered 2018-11-21 – 2018-11-23 (×3): 20 mg via ORAL
  Filled 2018-11-21 (×3): qty 2

## 2018-11-21 MED ORDER — VANCOMYCIN HCL IN DEXTROSE 1-5 GM/200ML-% IV SOLN: 1000.0000 mg | Freq: Once | INTRAVENOUS | Status: DC

## 2018-11-21 MED ORDER — KETOROLAC TROMETHAMINE 15 MG/ML IJ SOLN: 15.0000 mg | Freq: Four times a day (QID) | INTRAMUSCULAR | Status: AC | PRN

## 2018-11-21 MED ORDER — METOPROLOL TARTRATE 5 MG/5ML IV SOLN
5.0000 mg | Freq: Once | INTRAVENOUS | Status: AC
  Administered 2018-11-21: 5 mg via INTRAVENOUS
  Filled 2018-11-21: qty 5

## 2018-11-21 MED ORDER — ENOXAPARIN SODIUM 60 MG/0.6ML ~~LOC~~ SOLN
1.0000 mg/kg | Freq: Two times a day (BID) | SUBCUTANEOUS | Status: AC
  Administered 2018-11-21: 50 mg via SUBCUTANEOUS
  Filled 2018-11-21: qty 0.6
  Filled 2018-11-21: qty 0.5

## 2018-11-21 MED ORDER — ENOXAPARIN SODIUM 60 MG/0.6ML ~~LOC~~ SOLN
1.0000 mg/kg | Freq: Two times a day (BID) | SUBCUTANEOUS | Status: DC
  Administered 2018-11-22 – 2018-11-24 (×4): 50 mg via SUBCUTANEOUS
  Filled 2018-11-21 (×5): qty 0.6

## 2018-11-21 MED ORDER — ASPIRIN 81 MG PO CHEW
81.0000 mg | CHEWABLE_TABLET | Freq: Every day | ORAL | Status: DC
  Administered 2018-11-21 – 2018-11-24 (×4): 81 mg via ORAL
  Filled 2018-11-21 (×4): qty 1

## 2018-11-21 MED ORDER — LOSARTAN POTASSIUM 25 MG PO TABS
12.5000 mg | ORAL_TABLET | Freq: Every day | ORAL | Status: DC
  Administered 2018-11-21 – 2018-11-24 (×4): 12.5 mg via ORAL
  Filled 2018-11-21: qty 0.5
  Filled 2018-11-21 (×4): qty 1

## 2018-11-21 MED ORDER — VANCOMYCIN HCL IN DEXTROSE 1-5 GM/200ML-% IV SOLN: 1000.0000 mg | INTRAVENOUS | Status: DC

## 2018-11-21 MED ORDER — METOPROLOL TARTRATE 100 MG PO TABS
100.0000 mg | ORAL_TABLET | Freq: Every morning | ORAL | Status: DC
  Administered 2018-11-22 – 2018-11-24 (×3): 100 mg via ORAL
  Filled 2018-11-21 (×3): qty 1

## 2018-11-21 MED ORDER — PROMETHAZINE HCL 25 MG/ML IJ SOLN
12.5000 mg | Freq: Four times a day (QID) | INTRAMUSCULAR | Status: DC | PRN
  Administered 2018-11-21: 12.5 mg via INTRAVENOUS
  Filled 2018-11-21: qty 1

## 2018-11-21 MED ORDER — POTASSIUM CHLORIDE 10 MEQ/100ML IV SOLN
10.0000 meq | INTRAVENOUS | Status: AC
  Administered 2018-11-21 (×3): 10 meq via INTRAVENOUS
  Filled 2018-11-21 (×3): qty 100

## 2018-11-21 MED ORDER — METOPROLOL SUCCINATE ER 100 MG PO TB24: 100.0000 mg | ORAL_TABLET | Freq: Every day | ORAL | Status: DC

## 2018-11-21 MED ORDER — POTASSIUM CHLORIDE IN NACL 20-0.9 MEQ/L-% IV SOLN
INTRAVENOUS | Status: AC
  Administered 2018-11-21: 16:00:00 via INTRAVENOUS
  Filled 2018-11-21 (×2): qty 1000

## 2018-11-21 MED ORDER — MORPHINE SULFATE (PF) 2 MG/ML IV SOLN
1.0000 mg | INTRAVENOUS | Status: DC | PRN
  Administered 2018-11-21: 1 mg via INTRAVENOUS
  Filled 2018-11-21 (×2): qty 1

## 2018-11-21 MED ORDER — INSULIN ASPART 100 UNIT/ML ~~LOC~~ SOLN
0.0000 [IU] | Freq: Three times a day (TID) | SUBCUTANEOUS | Status: DC
  Administered 2018-11-22: 5 [IU] via SUBCUTANEOUS
  Administered 2018-11-22: 7 [IU] via SUBCUTANEOUS
  Administered 2018-11-22: 2 [IU] via SUBCUTANEOUS
  Administered 2018-11-23: 3 [IU] via SUBCUTANEOUS
  Administered 2018-11-23: 2 [IU] via SUBCUTANEOUS
  Administered 2018-11-23: 3 [IU] via SUBCUTANEOUS
  Administered 2018-11-24: 2 [IU] via SUBCUTANEOUS
  Administered 2018-11-24: 3 [IU] via SUBCUTANEOUS

## 2018-11-21 MED ORDER — METOPROLOL TARTRATE 50 MG PO TABS
50.0000 mg | ORAL_TABLET | Freq: Every evening | ORAL | Status: DC
  Administered 2018-11-21 – 2018-11-23 (×3): 50 mg via ORAL
  Filled 2018-11-21 (×3): qty 1

## 2018-11-21 MED ORDER — SODIUM CHLORIDE 0.9 % IV BOLUS
1000.0000 mL | Freq: Once | INTRAVENOUS | Status: AC
  Administered 2018-11-21: 1000 mL via INTRAVENOUS

## 2018-11-21 MED ORDER — PROMETHAZINE HCL 25 MG PO TABS
12.5000 mg | ORAL_TABLET | Freq: Four times a day (QID) | ORAL | Status: DC | PRN
  Filled 2018-11-21: qty 1

## 2018-11-21 MED ORDER — SUCRALFATE 1 GM/10ML PO SUSP
1.0000 g | Freq: Three times a day (TID) | ORAL | Status: DC
  Administered 2018-11-21 – 2018-11-24 (×12): 1 g via ORAL
  Filled 2018-11-21 (×12): qty 10

## 2018-11-21 MED ORDER — INSULIN ASPART 100 UNIT/ML ~~LOC~~ SOLN
0.0000 [IU] | Freq: Every day | SUBCUTANEOUS | Status: DC
  Administered 2018-11-23: 2 [IU] via SUBCUTANEOUS

## 2018-11-21 MED ORDER — SODIUM CHLORIDE 0.9 % IV SOLN: 2.0000 g | INTRAVENOUS | Status: DC

## 2018-11-21 MED ORDER — PANTOPRAZOLE SODIUM 40 MG PO TBEC
40.0000 mg | DELAYED_RELEASE_TABLET | Freq: Every day | ORAL | Status: DC
  Administered 2018-11-21 – 2018-11-23 (×3): 40 mg via ORAL
  Filled 2018-11-21 (×3): qty 1

## 2018-11-21 MED ORDER — ONDANSETRON HCL 4 MG/2ML IJ SOLN
4.0000 mg | Freq: Once | INTRAMUSCULAR | Status: AC
  Administered 2018-11-21: 4 mg via INTRAVENOUS
  Filled 2018-11-21: qty 2

## 2018-11-21 NOTE — ED Provider Notes (Signed)
Emergency Department Provider Note   I have reviewed the triage vital signs and the nursing notes.   HISTORY  Chief Complaint Abdominal Pain   HPI Kristen Lozano is a 62 y.o. female with PMH of CHF, DM, HLD, HTN, CVA, and chronic abdominal pain presents to the emergency department for evaluation of abdominal pain, chest pain, elevated heart rate.  Patient's husband provides most of the history.  He states that over the past several days patient has been eating a lot of sugary foods and that often this will cause her abdominal pain symptoms to worsen.  She has had worsening abdominal pain over the last 24 hours with vomiting.  She has intermittently kept fluids and some food down but has not taken her medications, including her metoprolol.  Patient states that she is also having some chest discomfort but has difficulty describing the quality.  No radiation of symptoms or other modifying factors.  Patient denies shortness of breath.   Past Medical History:  Diagnosis Date  . Acute combined systolic and diastolic HF (heart failure) ([place]) 10/16/2018  . Allergic rhinitis    Requires cetirizine, singulair, and fluticasone.  . Anxiety    Has been on Xanax since 2009. Uses it for stress, anxiety, and insomnia. No contract yet.  . Arthritis   . CHF (congestive heart failure) (H[facility])    EF 35% after stroke, presumed ischemic  . Chronic pain    Has OA of knees B. No Xrays in echart. Not requiring narcotics.  . Colitis 12/2017  . Depression   . Diabetes mellitus    Type 2, non insulin dependent. Was dx'd prior to 2008.  Marland Kitchen Hyperglycemia   . Hyperlipemia   . Hypertension   . Sickle cell trait ([place])   . Stroke [facility]) 09/05/14   Dominant left MCA infarcts secondary to unknown embolic source     Patient Active Problem List   Diagnosis Date Noted  . General weakness 10/25/2018  . Acute combined systolic and diastolic HF (heart failure) ([place]) 10/16/2018  . HLD (hyperlipidemia) 09/21/2018    . GERD (gastroesophageal reflux disease) 09/21/2018  . Nausea and vomiting 09/08/2018  . Nausea with vomiting 09/07/2018  . Dyspnea   . Hypomagnesemia   . Sinus tachycardia   . Malnutrition of moderate degree 08/13/2018  . Enteritis   . Pleural effusion   . Nausea 08/11/2018  . Dilated cardiomyopathy ([place])   . Nausea & vomiting 08/09/2018  . Type 2 diabetes mellitus with vascular disease ([place]) 08/09/2018  . Abdominal pain 08/09/2018  . Infectious colitis 01/11/2018  . Hypokalemia 01/11/2018  . Uncontrolled type 2 diabetes mellitus with hyperglycemia ([place])   . Chronic combined systolic and diastolic CHF (congestive heart failure) ([place]) 12/16/2016  . Monitoring for -term anticoagulant use 11/14/2016  . DKA (diabetic ketoacidoses) ([place]) 11/01/2016  . Diabetes mellitus 11/01/2016  . Elevated troponin   . Nonischemic cardiomyopathy ([place])   . LV (left ventricular) mural thrombus following MI ([place])   . Cognitive deficit due to recent stroke 10/18/2014  . History of completed stroke 10/18/2014  . Abnormal stress test 10/17/2014  . Dysphagia, pharyngoesophageal phase 09/23/2014  . Abnormal CT scan 09/23/2014  . Dilated bile duct 09/23/2014  . Urinary retention 09/07/2014  . 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 Stroke ([place]) 09/05/2014  . Cerebral infarction ([place]) 09/05/2014  . Non compliance w medication regimen 08/10/2012  . Fatigue 02/18/2012  . Essential hypertension 03/22/2011  . Healthcare maintenance 03/22/2011  . Chronic pain   .  ALLERGIC RHINITIS, SEASONAL 03/14/2008  . SICKLE CELL TRAIT 10/31/2006  . Anxiety state 10/31/2006  . DEPRESSION 10/31/2006    Past Surgical History:  Procedure Laterality Date  . BIOPSY  09/23/2018   Procedure: BIOPSY;  Surgeon: Yetta Flock, MD;  Location: [facility] ENDOSCOPY;  Service: Gastroenterology;;  . ESOPHAGOGASTRODUODENOSCOPY (EGD) WITH PROPOFOL N/A 09/23/2018   Procedure: ESOPHAGOGASTRODUODENOSCOPY (EGD) WITH  PROPOFOL;  Surgeon: Yetta Flock, MD;  Location: [facility] ENDOSCOPY;  Service: Gastroenterology;  Laterality: N/A;  . RIGHT/LEFT HEART CATH AND CORONARY ANGIOGRAPHY N/A 10/14/2018   Procedure: RIGHT/LEFT HEART CATH AND CORONARY ANGIOGRAPHY;  Surgeon: Troy Sine, MD;  Location: [place] [facility];  Service: Cardiovascular;  Laterality: N/A;  . TEE WITHOUT CARDIOVERSION N/A 09/06/2014   Procedure: TRANSESOPHAGEAL ECHOCARDIOGRAM (TEE);  Surgeon: Pixie Casino, MD;  Location[facility] ENDOSCOPY;  Service: Cardiovascular;  Laterality: N/A;   Allergies Patient has no known allergies.  Family History  Problem Relation Age of Onset  . Diabetes Mother   . Hypertension Mother   . Heart disease Father   . Heart attack Father   . Hypertension Father   . Diabetes Father   . Ovarian cancer Sister   . Liver cancer Sister   . Sickle cell anemia Daughter   . Schizophrenia Daughter   . Hypertension Sister   . Hypertension Brother   . Hypertension Daughter   . Kidney disease Sister        x2  . Kidney disease Brother   . Stroke Neg Hx   . Esophageal cancer Neg Hx   . Colon cancer Neg Hx   . Colon polyps Neg Hx     Social History Social History   Tobacco Use  . Smoking status: Never Smoker  . Smokeless tobacco: Never Used  Substance Use Topics  . Alcohol use: No    Alcohol/week: 0.0 standard drinks  . Drug use: No    Review of Systems  Constitutional: No fever/chills Eyes: No visual changes. ENT: No sore throat. Cardiovascular: Positive chest pain. Respiratory: Denies shortness of breath. Gastrointestinal: Positive abdominal pain. Positive nausea and vomiting.  No diarrhea.  No constipation. Genitourinary: Negative for dysuria. Musculoskeletal: Negative for back pain. Skin: Negative for rash. Neurological: Negative for headaches, focal weakness or numbness.  10-point ROS otherwise negative.  ____________________________________________   PHYSICAL EXAM:  VITAL  SIGNS: ED Triage Vitals  Enc Vitals Group     BP 11/21/18 0825 (!) 157/96     Pulse Rate 11/21/18 0825 (!) 150     Resp 11/21/18 0825 16     Temp 11/21/18 0825 98.2 F (36.8 C)     Temp Source 11/21/18 0825 Oral     SpO2 11/21/18 0825 100 %     Weight 11/21/18 0841 110 lb (49.9 kg)     Height 11/21/18 0841 5' 3.5" (1.613 m)     Pain Score 11/21/18 0827 9   Constitutional: Alert and oriented. Well appearing and in no acute distress. Eyes: Conjunctivae are normal. Head: Atraumatic. Nose: No congestion/rhinnorhea. Mouth/Throat: Mucous membranes are dry.  Neck: No stridor.   Cardiovascular: Tachycardia. Good peripheral circulation. Grossly normal heart sounds.   Respiratory: Normal respiratory effort.  No retractions. Lungs CTAB. Gastrointestinal: Soft and nontender. No distention.  Musculoskeletal: No lower extremity tenderness nor edema. No gross deformities of extremities. Neurologic:  Normal speech and language. No gross focal neurologic deficits are appreciated.  Skin:  Skin is warm, dry and intact. No rash noted.  ____________________________________________   Reva Bores (  all labs ordered are listed, but only abnormal results are displayed)  Labs Reviewed  COMPREHENSIVE METABOLIC PANEL - Abnormal; Notable for the following components:      Result Value   Potassium 3.1 (*)    Chloride 97 (*)    Glucose, Bld 240 (*)    Creatinine, Ser 1.04 (*)    Total Protein 8.5 (*)    Total Bilirubin 1.5 (*)    GFR calc non Af Amer 57 (*)    Anion gap 18 (*)    All other components within normal limits  CBC WITH DIFFERENTIAL/PLATELET - Abnormal; Notable for the following components:   WBC 13.0 (*)    RBC 6.77 (*)    MCV 65.4 (*)    MCH 21.1 (*)    RDW 17.8 (*)    Neutro Abs 10.1 (*)    Monocytes Absolute 1.2 (*)    Abs Immature Granulocytes 0.09 (*)    All other components within normal limits  URINALYSIS, ROUTINE W REFLEX MICROSCOPIC - Abnormal; Notable for the following  components:   Glucose, UA >=500 (*)    Ketones, ur 20 (*)    Protein, ur 100 (*)    Bacteria, UA RARE (*)    All other components within normal limits  PROTIME-INR - Abnormal; Notable for the following components:   Prothrombin Time 19.2 (*)    All other components within normal limits  GLUCOSE, CAPILLARY - Abnormal; Notable for the following components:   Glucose-Capillary 236 (*)    All other components within normal limits  LACTIC ACID, PLASMA - Abnormal; Notable for the following components:   Lactic Acid, Venous 2.0 (*)    All other components within normal limits  CBG MONITORING, ED - Abnormal; Notable for the following components:   Glucose-Capillary 229 (*)    All other components within normal limits  I-STAT CG4 LACTIC ACID, ED - Abnormal; Notable for the following components:   Lactic Acid, Venous 3.16 (*)    All other components within normal limits  I-STAT CG4 LACTIC ACID, ED - Abnormal; Notable for the following components:   Lactic Acid, Venous 2.22 (*)    All other components within normal limits  CULTURE, BLOOD (ROUTINE X 2)  CULTURE, BLOOD (ROUTINE X 2)  URINE CULTURE  LIPASE, BLOOD  TSH  D-DIMER, QUANTITATIVE (NOT AT [facility])  INFLUENZA PANEL BY PCR (TYPE A & B)  CBC  COMPREHENSIVE METABOLIC PANEL  LACTIC ACID, PLASMA  I-STAT TROPONIN, ED   ____________________________________________  EKG   EKG Interpretation  Date/Time:  Saturday November 21 2018 08:36:20 EST Ventricular Rate:  147 PR Interval:    QRS Duration: 84 QT Interval:  277 QTC Calculation: 434 R Axis:   12 Text Interpretation:  Sinus tachycardia Left atrial enlargement Left ventricular hypertrophy Borderline T abnormalities, inferior leads No STEMI.  Confirmed by Nanda Quinton [id number] on 11/21/2018 8:46:27 AM       ____________________________________________  RADIOLOGY  Dg Chest 2 View  Result Date: 11/21/2018 CLINICAL DATA:  Patient with nausea, vomiting and diarrhea EXAM: CHEST  - 2 VIEW COMPARISON:  Chest radiograph 10/25/2018 FINDINGS: Monitoring leads overlie the patient. Stable cardiomegaly. Aortic atherosclerosis. Bilateral interstitial opacities. Small bilateral pleural effusions. Thoracic spine degenerative changes. IMPRESSION: Cardiomegaly with mild interstitial opacities favored to represent edema. Small bilateral pleural effusions. Electronically Signed   By: Lovey Newcomer M.D.   On: 11/21/2018 10:43    ____________________________________________   PROCEDURES  Procedure(s) performed:   Procedures  CRITICAL CARE Performed by:  Wonda Olds  Total critical care time: 35 minutes Critical care time was exclusive of separately billable procedures and treating other patients. Critical care was necessary to treat or prevent imminent or life-threatening deterioration. Critical care was time spent personally by me on the following activities: development of treatment plan with patient and/or surrogate as well as nursing, discussions with consultants, evaluation of patient's response to treatment, examination of patient, obtaining history from patient or surrogate, ordering and performing treatments and interventions, ordering and review of laboratory studies, ordering and review of radiographic studies, pulse oximetry and re-evaluation of patient's condition.  Nanda Quinton, MD Emergency Medicine  ____________________________________________   INITIAL IMPRESSION / ASSESSMENT AND PLAN / ED COURSE  Pertinent labs & imaging results that were available during my care of the patient were reviewed by me and considered in my medical decision making (see chart for details).  Patient presents to the emergency department for evaluation of abdominal pain, chest pain, nausea vomiting.  She is found to have sinus tachycardia with a rate in the 150s.  Does not appear to be flutter.  Normal blood pressure.  Patient awake and alert.  Nontender abdomen diffusely.  Patient has  chronic abdominal pain thought to be secondary to gastritis with multiple prior work-ups.  Chest pain does appear somewhat new.  Given tachycardia plan for screening labs including lactate, d-dimer, troponin.  No ischemic changes on EKG.  With chest pain plan for chest x-ray.  Will give IV fluid and metoprolol.  Patient has not taken her metoprolol for the past several days because of nausea vomiting.  Discussed patient's case with Hospitalist to request admission. Patient and family (if present) updated with plan. Care transferred to Hospitalist service.  I reviewed all nursing notes, vitals, pertinent old records, EKGs, labs, imaging (as available).  ____________________________________________  FINAL CLINICAL IMPRESSION(S) / ED DIAGNOSES  Final diagnoses:  Precordial chest pain  Generalized abdominal pain  Tachycardia  Lactic acidosis     MEDICATIONS GIVEN DURING THIS VISIT:  Medications  aspirin chewable tablet 81 mg (81 mg Oral Given 11/21/18 1610)  atorvastatin (LIPITOR) tablet 20 mg (20 mg Oral Given 11/21/18 1609)  losartan (COZAAR) tablet 12.5 mg (12.5 mg Oral Given 11/21/18 1609)  pantoprazole (PROTONIX) EC tablet 40 mg (40 mg Oral Given 11/21/18 1610)  sucralfate (CARAFATE) 1 GM/10ML suspension 1 g (1 g Oral Given 11/21/18 1611)  feeding supplement (ENSURE ENLIVE) (ENSURE ENLIVE) liquid 237 mL (237 mLs Oral Not Given 11/21/18 1618)  metoprolol tartrate (LOPRESSOR) injection 5 mg (5 mg Intravenous Given 11/21/18 1424)  0.9 % NaCl with KCl 20 mEq/ L  infusion ( Intravenous New Bag/Given 11/21/18 1623)  oxyCODONE (Oxy IR/ROXICODONE) immediate release tablet 5 mg (has no administration in time range)  promethazine (PHENERGAN) tablet 12.5 mg (has no administration in time range)  morphine 2 MG/ML injection 1 mg (1 mg Intravenous Given 11/21/18 1422)  ketorolac (TORADOL) 15 MG/ML injection 15 mg (has no administration in time range)  enoxaparin (LOVENOX) injection 50 mg (50 mg  Subcutaneous Given 11/21/18 1611)    Followed by  enoxaparin (LOVENOX) injection 50 mg (has no administration in time range)  promethazine (PHENERGAN) injection 12.5 mg (12.5 mg Intravenous Given 11/21/18 1421)  metoprolol tartrate (LOPRESSOR) tablet 100 mg (has no administration in time range)  metoprolol tartrate (LOPRESSOR) tablet 50 mg (has no administration in time range)  insulin aspart (novoLOG) injection 0-9 Units (has no administration in time range)  insulin aspart (novoLOG) injection 0-5 Units (has no administration  in time range)  sodium chloride 0.9 % bolus 1,000 mL (0 mLs Intravenous Stopped 11/21/18 1039)  metoprolol tartrate (LOPRESSOR) injection 5 mg (5 mg Intravenous Given 11/21/18 0854)  ondansetron (ZOFRAN) injection 4 mg (4 mg Intravenous Given 11/21/18 0905)  ceFEPIme (MAXIPIME) 2 g in sodium chloride 0.9 % 100 mL IVPB (0 g Intravenous Stopped 11/21/18 1223)  potassium chloride 10 mEq in 100 mL IVPB (10 mEq Intravenous New Bag/Given 11/21/18 1743)    Note:  This document was prepared using Dragon voice recognition software and may include unintentional dictation errors.  Nanda Quinton, MD Emergency Medicine    , Wonda Olds, MD 11/21/18 2028

## 2018-11-21 NOTE — ED Triage Notes (Signed)
Pt endorse abd pain with n/v/d x 2 days. Pt has DM. VSS

## 2018-11-21 NOTE — H&P (Addendum)
History and Physical    ROSARIO DUEY KCM:[id number] DOB: 1956-02-01 DOA: 11/21/2018  PCP: Audley Hose, MD  Patient coming from: Home  Chief Complaint: Abdominal pain, nausea and vomiting  HPI: Kristen Lozano is a 62 y.o. female with medical history significant of H/O stroke, HTN, HLD, DM2, HFrEF (EF of around 20%), arthritis, anxiety, LV thrombus on coumadin who presents for abdominal pain, nausea and vomiting.  She reports symptoms started about 2 days ago.  She has not been able to keep anything down including pills and food.  Her emesis comes every time she has tried to eat, is NBNB and consists of the food or pills she consumed.  She also reports 2 days of loose stools.  She has had some lower chest pain which is constant, non radiating, not associated with movement, diaphoresis.  Seems to have come on after the vomiting and nausea and not prior to.  Has been constant, reports nothing makes it better.  She received Zofran in the ED and has not had a noticeable improvement in symptoms.  She denies diaphoresis, fever, myalgia, recent travel, sick contacts, recent surgery, headache, SOB, dysuria, cough, LE swelling, dizziness or lightheadness.  She did not have a change in food recently.  Per EDP, Husband said she had been eating a lot of sweets.  Well's score is 1.5 due to tachycardia, she is on coumadin, INR is subtherapeutic.    ED Course: In the ED, she was noted to have tachycardia, lactic acidosis, low K and WBC of 13.   CXR showed some interstitial edema, no apparent pneumonia.  EKG showed sinus tachycardia.  She was initiated on Sepsis protocol with broad spectrum Abx given tachycardia, elevated WBC and lactic acid.  She had BC and UC ordered.  She is also being screened for Influenza.   Review of Systems: As per HPI otherwise 10 point review of systems negative.    Past Medical History:  Diagnosis Date  . Acute combined systolic and diastolic HF (heart failure) ([place]) 10/16/2018    . Allergic rhinitis    Requires cetirizine, singulair, and fluticasone.  . Anxiety    Has been on Xanax since 2009. Uses it for stress, anxiety, and insomnia. No contract yet.  . Arthritis   . CHF (congestive heart failure) (HCC)    EF 35% after stroke, presumed ischemic  . Chronic pain    Has OA of knees B. No Xrays in echart. Not requiring narcotics.  . Colitis 12/2017  . Depression   . Diabetes mellitus    Type 2, non insulin dependent. Was dx'd prior to 2008.  Marland Kitchen Hyperglycemia   . Hyperlipemia   . Hypertension   . Sickle cell trait ([place])   . Stroke [facility]) 09/05/14   Dominant left MCA infarcts secondary to unknown embolic source     Past Surgical History:  Procedure Laterality Date  . BIOPSY  09/23/2018   Procedure: BIOPSY;  Surgeon: Yetta Flock, MD;  Location: [facility] ENDOSCOPY;  Service: Gastroenterology;;  . ESOPHAGOGASTRODUODENOSCOPY (EGD) WITH PROPOFOL N/A 09/23/2018   Procedure: ESOPHAGOGASTRODUODENOSCOPY (EGD) WITH PROPOFOL;  Surgeon: Yetta Flock, MD;  Location: [facility];  Service: Gastroenterology;  Laterality: N/A;  . RIGHT/LEFT HEART CATH AND CORONARY ANGIOGRAPHY N/A 10/14/2018   Procedure: RIGHT/LEFT HEART CATH AND CORONARY ANGIOGRAPHY;  Surgeon: Troy Sine, MD;  Location: [facility];  Service: Cardiovascular;  Laterality: N/A;  . TEE WITHOUT CARDIOVERSION N/A 09/06/2014   Procedure: TRANSESOPHAGEAL ECHOCARDIOGRAM (TEE);  Surgeon:  Pixie Casino, MD;  Location: [facility]COPY;  Service: Cardiovascular;  Laterality: N/A;     reports that she has never smoked. She has never used smokeless tobacco. She reports that she does not drink alcohol or use drugs.  No Known Allergies  Family History  Problem Relation Age of Onset  . Diabetes Mother   . Hypertension Mother   . Heart disease Father   . Heart attack Father   . Hypertension Father   . Diabetes Father   . Ovarian cancer Sister   . Liver cancer Sister   . Sickle cell anemia  Daughter   . Schizophrenia Daughter   . Hypertension Sister   . Hypertension Brother   . Hypertension Daughter   . Kidney disease Sister        x2  . Kidney disease Brother   . Stroke Neg Hx   . Esophageal cancer Neg Hx   . Colon cancer Neg Hx   . Colon polyps Neg Hx     Prior to Admission medications   Medication Sig Start Date End Date Taking? Authorizing Provider  ASPIRIN LOW DOSE 81 MG EC tablet Take 1 tablet (81 mg total) by mouth daily. Patient taking differently: Take 81 mg by mouth daily.  01/22/18  Yes Hilty, Nadean Corwin, MD  atorvastatin (LIPITOR) 20 MG tablet Take 1 tablet (20 mg total) by mouth daily at 6 PM. Please schedule appointment for refills. 01/02/18  Yes Hilty, Nadean Corwin, MD  furosemide (LASIX) 40 MG tablet Take 1 tablet (40 mg total) by mouth daily. 10/17/18  Yes Isaiah Serge, NP  insulin glargine (LANTUS) 100 UNIT/ML injection Inject 0.1 mLs (10 Units total) into the skin at bedtime. 09/25/18  Yes Oswald Hillock, MD  losartan (COZAAR) 25 MG tablet Take 0.5 tablets (12.5 mg total) by mouth daily. 10/17/18  Yes Isaiah Serge, NP  metFORMIN (GLUCOPHAGE-XR) 500 MG 24 hr tablet Take 2 tablets (1,000 mg total) by mouth 2 (two) times daily. 10/17/18  Yes Isaiah Serge, NP  metoprolol succinate (TOPROL-XL) 100 MG 24 hr tablet Take 1 tablet (100 mg total) by mouth daily. Take with or immediately following a meal.  In the Morning 10/17/18  Yes Isaiah Serge, NP  docusate sodium (COLACE) 100 MG capsule Take 1 capsule (100 mg total) by mouth 2 (two) times daily as needed for mild constipation. Patient not taking: Reported on 10/25/2018 10/16/18   Isaiah Serge, NP  feeding supplement, ENSURE ENLIVE, (ENSURE ENLIVE) LIQD Take 237 mLs by mouth 3 (three) times daily between meals. 08/17/18   Desiree Hane, MD  guaiFENesin (ROBITUSSIN) 100 MG/5ML SOLN Take 15 mLs (300 mg total) by mouth 4 (four) times daily. 10/16/18   Isaiah Serge, NP  ivabradine (CORLANOR) 5 MG TABS  tablet Take 1 tablet (5 mg total) by mouth 2 (two) times daily with a meal. Patient not taking: Reported on 10/25/2018 10/16/18   Isaiah Serge, NP  metoprolol succinate (TOPROL-XL) 50 MG 24 hr tablet Take 1 tablet (50 mg total) by mouth at bedtime. Take with or immediately following a meal. 10/16/18   Isaiah Serge, NP  ondansetron (ZOFRAN) 4 MG tablet Take 1 tablet (4 mg total) by mouth every 12 (twelve) hours. 09/25/18   Oswald Hillock, MD  pantoprazole (PROTONIX) 40 MG tablet Take 1 tablet (40 mg total) by mouth daily with supper. 10/16/18   Isaiah Serge, NP  polyethylene glycol [facility] Floria Raveling) packet Take  17 g by mouth daily as needed. Patient not taking: Reported on 10/25/2018 09/09/18   Oretha Milch D, MD  potassium chloride (K-DUR) 10 MEQ tablet Take 1 tablet (10 mEq total) by mouth daily. 09/02/18   Georgette Shell, MD  sucralfate (CARAFATE) 1 GM/10ML suspension Take 10 mLs (1 g total) by mouth 4 (four) times daily -  with meals and at bedtime for 10 days. 10/19/18 10/29/18  Margette Fast, MD  warfarin (COUMADIN) 5 MG tablet Take 1 to 1.5 tablets by mouth daily as directed by coumadin clinic  Take 1.5 pills on Mondays and Fridays and 1 pill Tuesday, Wed. THursday, Saturday and Sunday Patient taking differently: Take 5-7.5 mg by mouth See admin instructions. Take 1.5 pills (7.71m totally) by mouth on Mondays and Fridays and take 1 tablet (528mtotally) by mouth on Tuesday, Wed. THursday, Saturday and Sunday 10/16/18   InIsaiah SergeNP    Physical Exam: Vitals:   11/21/18 1000 11/21/18 1015 11/21/18 1100 11/21/18 1130  BP: (!) 145/101 (!) 148/109 (!) 140/99 (!) 157/94  Pulse:  (!) 135 (!) 131 (!) 135  Resp: (!) 21 14 (!) 21 (!) 23  Temp:      TempSrc:      SpO2:  99% 97% 99%  Weight:      Height:        Constitutional: Thin woman, lying in bed, in some discomfort, holding stomach Vitals:   11/21/18 1000 11/21/18 1015 11/21/18 1100 11/21/18 1130  BP: (!) 145/101  (!) 148/109 (!) 140/99 (!) 157/94  Pulse:  (!) 135 (!) 131 (!) 135  Resp: (!) 21 14 (!) 21 (!) 23  Temp:      TempSrc:      SpO2:  99% 97% 99%  Weight:      Height:       Eyes:  lids and conjunctivae normal ENMT: Mucous membranes are dry with mild white discoloration to tongue. Poor dentition Neck: normal, supple, no masses Respiratory: Surprisingly clear to auscultation, no crackles at bases, no wheezing Cardiovascular: Tachycardic, prominent carotid pulse noted, no murmurs Abdomen: TTP throughout to very light touch, could not tolerate deep palpation, decreased bowel sounds, non distended.  Musculoskeletal: She has fake nails on, no apparent cyanosis.  She had decreased skin turgor and thin limbs without edema Skin: no rashes, lesions, ulcers on exposed skin Neurologic: CN 2-12 grossly intact. Sensation intact to light touch.  She had good grip strength and grossly intact strength in upper and lower extremities Psychiatric: Normal judgment and insight. Alert and oriented x 3. Normal mood.    Labs on Admission: I have personally reviewed following labs and imaging studies  CBC: Recent Labs  Lab 11/21/18 0841  WBC 13.0*  NEUTROABS 10.1*  HGB 14.3  HCT 44.3  MCV 65.4*  PLT 27213 Basic Metabolic Panel: Recent Labs  Lab 11/21/18 0841  NA 139  K 3.1*  CL 97*  CO2 24  GLUCOSE 240*  BUN 14  CREATININE 1.04*  CALCIUM 9.6   GFR: Estimated Creatinine Clearance: 43.6 mL/min (A) (by C-G formula based on SCr of 1.04 mg/dL (H)). Liver Function Tests: Recent Labs  Lab 11/21/18 0841  AST 36  ALT 37  ALKPHOS 60  BILITOT 1.5*  PROT 8.5*  ALBUMIN 4.1   Recent Labs  Lab 11/21/18 0841  LIPASE 28   No results for input(s): AMMONIA in the last 168 hours. Coagulation Profile: Recent Labs  Lab 11/21/18 0841  INR 1.64  Cardiac Enzymes: No results for input(s): CKTOTAL, CKMB, CKMBINDEX, TROPONINI in the last 168 hours. BNP (last 3 results) No results for input(s):  PROBNP in the last 8760 hours. HbA1C: No results for input(s): HGBA1C in the last 72 hours. CBG: Recent Labs  Lab 11/21/18 0837  GLUCAP 229*   Lipid Profile: No results for input(s): CHOL, HDL, LDLCALC, TRIG, CHOLHDL, LDLDIRECT in the last 72 hours. Thyroid Function Tests: Recent Labs    11/21/18 0932  TSH 1.731   Anemia Panel: No results for input(s): VITAMINB12, FOLATE, FERRITIN, TIBC, IRON, RETICCTPCT in the last 72 hours. Urine analysis:    Component Value Date/Time   COLORURINE YELLOW 10/25/2018 [place] 10/25/2018 1053   LABSPEC 1.020 10/25/2018 1053   PHURINE 5.0 10/25/2018 1053   GLUCOSEU 50 (A) 10/25/2018 1053   HGBUR NEGATIVE 10/25/2018 1053   BILIRUBINUR NEGATIVE 10/25/2018 1053   KETONESUR 20 (A) 10/25/2018 1053   PROTEINUR 30 (A) 10/25/2018 1053   UROBILINOGEN 1.0 09/07/2014 1405   NITRITE NEGATIVE 10/25/2018 1053   LEUKOCYTESUR NEGATIVE 10/25/2018 1053    Radiological Exams on Admission: Dg Chest 2 View  Result Date: 11/21/2018 CLINICAL DATA:  Patient with nausea, vomiting and diarrhea EXAM: CHEST - 2 VIEW COMPARISON:  Chest radiograph 10/25/2018 FINDINGS: Monitoring leads overlie the patient. Stable cardiomegaly. Aortic atherosclerosis. Bilateral interstitial opacities. Small bilateral pleural effusions. Thoracic spine degenerative changes. IMPRESSION: Cardiomegaly with mild interstitial opacities favored to represent edema. Small bilateral pleural effusions. Electronically Signed   By: Lovey Newcomer M.D.   On: 11/21/2018 10:43    EKG: Independently reviewed. Sinus tachycardia, noisy baseline  Assessment/Plan Nausea & vomiting and Abdominal pain, leukocytosis  - This has been a chronic issues with extensive work up in the past including EGD and gastric emptying study without clear cause.  CT scan of the abdomen noted in November showing liver congestion - Will treat conservatively with zofran, promethazine, oxycodone and morphine for pain  and PPI.  Last evaluation showed that GI recommended treating with BID zofran and PPI.  She was supposed to follow up with GI, but I do not see that she did.  This could also be due to increased sweets and dietary issues - IVF with NS and KCl at 100cc/hr for 5 hours, she has severe CHF, reassess frequently need for more fluids.  She has currently received about 1200 of fluids so far.  - SLP evaluation - Trend lactate, would restart fluids if remains elevated.     Essential hypertension - Blood pressure has been variable, she has not been able to keep medications down - Will start IV metoprolol PRN and hydralazine PRN for very high blood pressures - She has had elevated HR while here, which I think is related to not taking her BB for 2 days - Discussed with pharmacy, and apparently her husband is crushing the long acting metoprolol, which is not recommended - Transition metoprolol to tartrate formulation, same dose.  Once she is able to tolerate PO, monitor to see if HR improves with treatment.      Nonischemic cardiomyopathy (EF of 20-25% on most recent TTE) - EF is 20-25%, she does not appear to have volume overload at this time, however, she does have pleural effusions noted on CXR.  Will need to be careful with fluid administration - Hold lasix for today, given decreased PO and likely dehydration - Restart in 24 - 48 hours after assessment of volume status  LV (left ventricular) mural thrombus following  MI  - Transition to lovenox until she can tolerate coumadin    Hypokalemia - Replace by IV, recheck BMET in the AM    Uncontrolled type 2 diabetes mellitus with hyperglycemia - SSI started, monitor PO intake - Monitor for hypoglycemia    HLD (hyperlipidemia) - Hold statin until she can eat again    GERD (gastroesophageal reflux disease) - Continue PPI, continue sucralfate  DVT prophylaxis: Lovenox  Code Status: Full  Disposition Plan: Admit for treatment of nausea, pain    Consults called: None, consider GI if not improving  Admission status: Cardiac telemetry, obs   Gilles Chiquito MD [facility] Pager [phone]  If 7PM-7AM, please contact night-coverage [email or web address] Password [facility]  11/21/2018, 11:41 AM

## 2018-11-21 NOTE — Progress Notes (Signed)
Pharmacy Antibiotic Note  Kristen Lozano is a 62 y.o. female admitted on 11/21/2018 with sepsis - unknown source.  Pharmacy has been consulted for Vancomycin and Cefepime dosing. Patient is also on Flagyl for anaerobic coverage. No known allergies. Patient has chronic abdominal pain but this is worse with vomiting so possible abdominal source. Also CXR noted to have pleural effusions. LA 3>>2. Leukocytosis noted. Patient is current afebrile. Cefepime 2g x1 and Vancomycin 1g x1 (20mg /kg) ordered and being given. SCr is elevated at 1.04 (baseline ~0.6 to 0.8).   Plan: Continue Cefepime 2g IV every 24 hours Continue Vancomycin 1g IV every 24 hours.  Monitor renal function, culture results, and clinical status.  Vancomycin levels if indicated.   Height: 5' 3.5" (161.3 cm) Weight: 110 lb (49.9 kg) IBW/kg (Calculated) : 53.55  Temp (24hrs), Avg:98.2 F (36.8 C), Min:98.2 F (36.8 C), Max:98.2 F (36.8 C)  Recent Labs  Lab 11/21/18 0841 11/21/18 0856 11/21/18 1043  WBC 13.0*  --   --   CREATININE 1.04*  --   --   LATICACIDVEN  --  3.16* 2.22*    Estimated Creatinine Clearance: 43.6 mL/min (A) (by C-G formula based on SCr of 1.04 mg/dL (H)).    No Known Allergies  Antimicrobials this admission: Vancomycin 12/28 >> Cefepime 12/28 >>  Dose adjustments this admission:   Microbiology results: 12/28 BCx:  12/28 UCx:  Thank you for allowing pharmacy to be a part of this patient's care.  Sloan Leiter, PharmD, BCPS, BCCCP Clinical Pharmacist Please refer to [facility] for [place] numbers 11/21/2018 11:17 AM

## 2018-11-21 NOTE — Progress Notes (Signed)
ANTICOAGULATION CONSULT NOTE - Initial Consult  Pharmacy Consult for Lovenox Indication: LV thrombus  No Known Allergies  Patient Measurements: Height: 5' 3.5" (161.3 cm) Weight: 110 lb (49.9 kg) IBW/kg (Calculated) : 53.55  Vital Signs: Temp: 98.2 F (36.8 C) (12/28 0825) Temp Source: Oral (12/28 0825) BP: 157/94 (12/28 1130) Pulse Rate: 135 (12/28 1130)  Labs: Recent Labs    11/21/18 0841  HGB 14.3  HCT 44.3  PLT 275  LABPROT 19.2*  INR 1.64  CREATININE 1.04*    Estimated Creatinine Clearance: 43.6 mL/min (A) (by C-G formula based on SCr of 1.04 mg/dL (H)).   Medical History: Past Medical History:  Diagnosis Date  . Acute combined systolic and diastolic HF (heart failure) ([place]) 10/16/2018  . Allergic rhinitis    Requires cetirizine, singulair, and fluticasone.  . Anxiety    Has been on Xanax since 2009. Uses it for stress, anxiety, and insomnia. No contract yet.  . Arthritis   . CHF (congestive heart failure) ([facility])    EF 35% after stroke, presumed ischemic  . Chronic pain    Has OA of knees B. No Xrays in echart. Not requiring narcotics.  . Colitis 12/2017  . Depression   . Diabetes mellitus    Type 2, non insulin dependent. Was dx'd prior to 2008.  Marland Kitchen Hyperglycemia   . Hyperlipemia   . Hypertension   . Sickle cell trait ([place])   . Stroke [facility]) 09/05/14   Dominant left MCA infarcts secondary to unknown embolic source    Assessment: 62 year old female on Warfarin prior to admission for LV thrombus now being bridged with Lovenox as patient currently unable to keep oral medications down due to vomiting. Pharmacy consulted to dose Lovenox.  Last Warfarin dose was on 12/26 at 19:00 PM. Missed dose 12/27.  INR on admission reflects this as it is sub-therapeutic at 1.64.  CBC is within normal limits. No bleeding is noted.   PTA Warfarin regimen was 7.5mg  on Mondays and Fridays and 5mg  all other days.   Goal of Therapy:  Anti-Xa level 0.6-1 units/ml 4hrs  after LMWH dose given Monitor platelets by anticoagulation protocol: Yes   Plan:  Start Lovenox 1mg /kg (50mg ) SQ every 12 hours.  Monitor CBC and SCr.  Follow-up ability to resume Warfarin.   Sloan Leiter, PharmD, BCPS, BCCCP Clinical Pharmacist Please refer to [facility]: [place] [facility] for [facility] Pharmacy numbers 11/21/2018,12:07 PM

## 2018-11-22 DIAGNOSIS — R112 Nausea with vomiting, unspecified: Secondary | ICD-10-CM | POA: Diagnosis not present

## 2018-11-22 LAB — URINE CULTURE
Culture: NO GROWTH
Special Requests: NORMAL

## 2018-11-22 LAB — GLUCOSE, CAPILLARY
Glucose-Capillary: 335 mg/dL — ABNORMAL HIGH (ref 70–99)
Glucose-Capillary: 284 mg/dL — ABNORMAL HIGH (ref 70–99)
Glucose-Capillary: 171 mg/dL — ABNORMAL HIGH (ref 70–99)
Glucose-Capillary: 146 mg/dL — ABNORMAL HIGH (ref 70–99)

## 2018-11-22 LAB — COMPREHENSIVE METABOLIC PANEL
ALBUMIN: 3 g/dL — AB (ref 3.5–5.0)
ALT: 90 U/L — ABNORMAL HIGH (ref 0–44)
AST: 113 U/L — AB (ref 15–41)
Alkaline Phosphatase: 59 U/L (ref 38–126)
Anion gap: 9 (ref 5–15)
BUN: 17 mg/dL (ref 8–23)
CO2: 23 mmol/L (ref 22–32)
Calcium: 8.6 mg/dL — ABNORMAL LOW (ref 8.9–10.3)
Chloride: 106 mmol/L (ref 98–111)
Creatinine, Ser: 0.94 mg/dL (ref 0.44–1.00)
GFR calc Af Amer: >60 mL/min (ref >60)
GFR calc non Af Amer: >60 mL/min (ref >60)
GLUCOSE: 353 mg/dL — AB (ref 70–99)
Potassium: 3.7 mmol/L (ref 3.5–5.1)
Sodium: 138 mmol/L (ref 135–145)
Total Bilirubin: 1 mg/dL (ref 0.3–1.2)
Total Protein: 6.3 g/dL — ABNORMAL LOW (ref 6.5–8.1)

## 2018-11-22 LAB — CBC
HCT: 37.2 % (ref 36.0–46.0)
Hemoglobin: 12.1 g/dL (ref 12.0–15.0)
MCH: 21.2 pg — ABNORMAL LOW (ref 26.0–34.0)
MCHC: 32.5 g/dL (ref 30.0–36.0)
MCV: 65.1 fL — ABNORMAL LOW (ref 80.0–100.0)
NRBC: 0 % (ref 0.0–0.2)
Platelets: 222 10*3/uL (ref 150–400)
RBC: 5.71 MIL/uL — ABNORMAL HIGH (ref 3.87–5.11)
RDW: 16.3 % — ABNORMAL HIGH (ref 11.5–15.5)
WBC: 10.5 10*3/uL (ref 4.0–10.5)

## 2018-11-22 LAB — HEMOGLOBIN A1C
Hgb A1c MFr Bld: 8.3 % — ABNORMAL HIGH (ref 4.8–5.6)
Mean Plasma Glucose: 191.51 mg/dL

## 2018-11-22 MED ORDER — INSULIN GLARGINE 100 UNIT/ML ~~LOC~~ SOLN
10.0000 [IU] | Freq: Every day | SUBCUTANEOUS | Status: DC
  Administered 2018-11-22 – 2018-11-23 (×2): 10 [IU] via SUBCUTANEOUS
  Filled 2018-11-22 (×4): qty 0.1

## 2018-11-22 MED ORDER — PROMETHAZINE HCL 25 MG/ML IJ SOLN
12.5000 mg | Freq: Four times a day (QID) | INTRAMUSCULAR | Status: DC | PRN
  Administered 2018-11-22 – 2018-11-23 (×2): 12.5 mg via INTRAVENOUS
  Filled 2018-11-22 (×3): qty 1

## 2018-11-22 MED ORDER — SODIUM CHLORIDE 0.9 % IV SOLN
8.0000 mg | Freq: Four times a day (QID) | INTRAVENOUS | Status: DC | PRN
  Filled 2018-11-22: qty 4

## 2018-11-22 MED ORDER — GUAIFENESIN-DM 100-10 MG/5ML PO SYRP
5.0000 mL | ORAL_SOLUTION | ORAL | Status: DC | PRN
  Administered 2018-11-23: 5 mL via ORAL
  Filled 2018-11-22: qty 5

## 2018-11-22 NOTE — Progress Notes (Signed)
PROGRESS NOTE    Kristen Lozano  WFU:[id number] DOB: [birth year] DOA: 11/21/2018 PCP: Audley Hose, MD   Brief Narrative:  Kristen Lozano is a 62 y.o. female with medical history significant of H/O stroke, HTN, HLD, DM2, HFrEF (EF of around 20%), arthritis, anxiety, LV thrombus on coumadin who presents for abdominal pain, nausea and vomiting.  She reports symptoms started about 2 days ago.  She has not been able to keep anything down including pills and food.  Her emesis comes every time she has tried to eat, is NBNB and consists of the food or pills she consumed.  She also reports 2 days of loose stools.   She was unresponsive to Zofran in the emergency department and was referred to [place] for further evaluation and management.  Patient's husband reports that she eats what she likes especially sweets.  He feels that they are upsetting her stomach and causing her to be ill.  He is a very well-meaning gentleman who loves his wife a great deal clearly.  Wants to keep her around as long as possible.  Was recently seen by the cardiologist who ordered ivabradine 5mg  BID to help treat her tachycardia associated with her congestive heart failure.  Unfortunately her insurance has not allowed prior authorization and this medicine has not yet been obtained.  Patient has a long history of tachycardia. In the emergency department given she had some tachycardia and elevated white cell and a lactic acid there was concern for sepsis however on evaluation by internal medicine we do not feel the patient has sepsis at all.  She has had some residual effects of her stroke years ago.  She is apparently nonverbal but is around quite well.  In fact her fingernails and toenails are expertly painted.  Clearly takes good care of herself as does her husband.  He states that she is able to cook and do most ADLs.  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  CT scan of the abdomen noted in November showing liver congestion - Will treat conservatively with  promethazine, oxycodone and morphine for pain and PPI.  Patient does not respond to Zofran treatment. Last evaluation showed that GI recommended treating with BID zofran and PPI, but patient did not do well with that regimen. She was supposed to follow up with GI, but I do not see that she did.  This could also be due to increased sweets and dietary issues - IVF with NS and KCl at 100cc/hr for 5 hours and judiciously and she has completed these. - SLP evaluation Lactate has resolved. She is tolerating her p.o. pills we will increase her clear liquids to soft foods.    Essential hypertension - Blood pressure has been variable, she has not been able to keep medications down today. - Will continue IV metoprolol PRN and hydralazine PRN for very high blood pressures - She has had elevated HR while here, which I think is neck related to her very poor ejection fraction  and exacerbated by not taking her BB for 2 days - Discussed with pharmacy, and apparently her husband is crushing the long acting metoprolol, which is not recommended - Transition metoprolol to tartrate formulation, same dose.  Once she is able to tolerate PO, monitor to see if HR improves with treatment.      Nonischemic cardiomyopathy (EF of 20-25% on most recent TTE) - EF is 20-25%, she does not appear to have volume overload at this time, however, she does have pleural effusions noted on CXR.  Will need to be careful with fluid  administration - Hold lasix for today, given decreased PO and likely dehydration - Restart in a.m. Monday  after assessment of volume status  LV (left ventricular) mural thrombus following MI  - Transition to lovenox until she can tolerate coumadin    Hypokalemia -Resolved    Uncontrolled type 2 diabetes mellitus with hyperglycemia - SSI started, monitor PO intake - Monitor for hypoglycemia -reStart home nocturnal insulin dosing    HLD (hyperlipidemia) - Hold statin until she can eat again    GERD (gastroesophageal reflux disease) - Continue PPI, continue sucralfate  Life care plan: Had a very long extensive discussion with the husband that I do not expect her health status in general to improve.  Given that she has congestive heart failure and that she has had a stroke for which she has had residual deficits now for 3 years it is unlikely that she will be able to improve.  I explained to him that I do believe that she will have episodes of worsening and then stabilization followed by worsening and stabilization.  He is very clear that he wants to do everything to keep her alive as long as possible.  He wants all measures employed to do so.  DVT prophylaxis: Lovenox  Code Status: Full  Disposition Plan: Admit for treatment of nausea, pain  Consults called: None, consider GI if not improving  Admission status: Cardiac telemetry, obs  Subjective: Lying in bed does not respond to questions appears to be very sleepy from Phenergan  Objective: Vitals:   11/22/18 0636 11/22/18 0900 11/22/18 1029 11/22/18 1300  BP:    119/85  Pulse: (!) 115 (!) 129 (!) 108 (!) 108  Resp:    20  Temp:    98.8 F (37.1 C)  TempSrc:    Oral  SpO2:  99% 98% 97%  Weight:      Height:        Intake/Output Summary (Last 24 hours) at 11/22/2018 1558 Last data filed at 11/22/2018 1300 Gross per 24 hour  Intake 922 ml  Output 250 ml  Net 672 ml   Filed Weights   11/21/18 0841 11/22/18 0559    Weight: 49.9 kg 48.6 kg    Examination:  General exam: Appears calm and comfortable  Respiratory system: Clear to auscultation. Respiratory effort normal. Cardiovascular system: S1 & S2 heard, RRR. No JVD, murmurs, rubs, gallops or clicks. No pedal edema. Gastrointestinal system: Abdomen is nondistended, soft and nontender. No organomegaly or masses felt. Normal bowel sounds heard. Central nervous system: Alert and oriented. No focal neurological deficits. Extremities: Symmetric 5 x 5 power. Skin: No rashes, lesions or ulcers Psychiatry: Judgement and insight appear normal. Mood & affect appropriate.     Data Reviewed: I have personally reviewed following labs and imaging studies  CBC: Recent Labs  Lab 11/21/18 0841 11/22/18 0542  WBC 13.0* 10.5  NEUTROABS 10.1*  --   HGB 14.3  12.1  HCT 44.3 37.2  MCV 65.4* 65.1*  PLT 275 226   Basic Metabolic Panel: Recent Labs  Lab 11/21/18 0841 11/22/18 0542  NA 139 138  K 3.1* 3.7  CL 97* 106  CO2 24 23  GLUCOSE 240* 353*  BUN 14 17  CREATININE 1.04* 0.94  CALCIUM 9.6 8.6*   GFR: Estimated Creatinine Clearance: 47 mL/min (by C-G formula based on SCr of 0.94 mg/dL). Liver Function Tests: Recent Labs  Lab 11/21/18 0841 11/22/18 0542  AST 36 113*  ALT 37 90*  ALKPHOS 60 59  BILITOT 1.5* 1.0  PROT 8.5* 6.3*  ALBUMIN 4.1 3.0*   Recent Labs  Lab 11/21/18 0841  LIPASE 28   No results for input(s): AMMONIA in the last 168 hours. Coagulation Profile: Recent Labs  Lab 11/21/18 0841  INR 1.64   Cardiac Enzymes: No results for input(s): CKTOTAL, CKMB, CKMBINDEX, TROPONINI in the last 168 hours. BNP (last 3 results) No results for input(s): PROBNP in the last 8760 hours. HbA1C: Recent Labs    11/22/18 0608  HGBA1C 8.3*   CBG: Recent Labs  Lab 11/21/18 0837 11/21/18 1805 11/21/18 2103 11/22/18 0740 11/22/18 1152  GLUCAP 229* 236* 260* 335* 284*   Lipid Profile: No results for input(s): CHOL, HDL,  LDLCALC, TRIG, CHOLHDL, LDLDIRECT in the last 72 hours. Thyroid Function Tests: Recent Labs    11/21/18 0932  TSH 1.731   Anemia Panel: No results for input(s): VITAMINB12, FOLATE, FERRITIN, TIBC, IRON, RETICCTPCT in the last 72 hours. Sepsis Labs: Recent Labs  Lab 11/21/18 0856 11/21/18 1043 11/21/18 1823 11/21/18 2028  LATICACIDVEN 3.16* 2.22* 2.0* 1.7    Recent Results (from the past 240 hour(s))  Blood Culture (routine x 2)     Status: None (Preliminary result)   Collection Time: 11/21/18  9:30 AM  Result Value Ref Range Status   Specimen Description BLOOD RIGHT ANTECUBITAL  Final   Special Requests   Final    BOTTLES DRAWN AEROBIC AND ANAEROBIC Blood Culture results may not be optimal due to an inadequate volume of blood received in culture bottles   Culture   Final    NO GROWTH 1 DAY Performed at [facility] [address], [place], [place] [address]    Report Status PENDING  Incomplete  Blood Culture (routine x 2)     Status: None (Preliminary result)   Collection Time: 11/21/18 10:37 AM  Result Value Ref Range Status   Specimen Description BLOOD LEFT ANTECUBITAL  Final   Special Requests   Final    BOTTLES DRAWN AEROBIC AND ANAEROBIC Blood Culture adequate volume   Culture   Final    NO GROWTH < 24 HOURS Performed at [facility], [place] [address], [place], [place] [address]    Report Status PENDING  Incomplete  Urine culture     Status: None   Collection Time: 11/21/18 11:46 AM  Result Value Ref Range Status   Specimen Description URINE, CLEAN CATCH  Final   Special Requests Normal  Final   Culture   Final    NO GROWTH Performed at [facility], [address], [place],  [address]    Report Status 11/22/2018 FINAL  Final         Radiology Studies: Dg Chest 2 View  Result Date: 11/21/2018 CLINICAL DATA:  Patient with nausea, vomiting and diarrhea EXAM: CHEST - 2 VIEW COMPARISON:  Chest radiograph 10/25/2018 FINDINGS:  Monitoring leads overlie the  patient. Stable cardiomegaly. Aortic atherosclerosis. Bilateral interstitial opacities. Small bilateral pleural effusions. Thoracic spine degenerative changes. IMPRESSION: Cardiomegaly with mild interstitial opacities favored to represent edema. Small bilateral pleural effusions. Electronically Signed   By: Lovey Newcomer M.D.   On: 11/21/2018 10:43        Scheduled Meds: . aspirin  81 mg Oral Daily  . atorvastatin  20 mg Oral q1800  . enoxaparin (LOVENOX) injection  1 mg/kg Subcutaneous Q12H  . feeding supplement (ENSURE ENLIVE)  237 mL Oral TID BM  . insulin aspart  0-5 Units Subcutaneous QHS  . insulin aspart  0-9 Units Subcutaneous TID WC  . losartan  12.5 mg Oral Daily  . metoprolol tartrate  100 mg Oral q morning - 10a  . metoprolol tartrate  50 mg Oral QPM  . pantoprazole  40 mg Oral Q supper  . sucralfate  1 g Oral TID WC & HS   Continuous Infusions:   LOS: 0 days    Time spent: 30 minutes    Lady Deutscher, MD FACP [facility] Pager [phone]  If 7PM-7AM, please contact night-coverage [email or web address] Password [facility] 11/22/2018, 3:58 PM

## 2018-11-23 ENCOUNTER — Observation Stay (HOSPITAL_COMMUNITY): Payer: Medicare HMO

## 2018-11-23 DIAGNOSIS — E860 Dehydration: Secondary | ICD-10-CM | POA: Diagnosis present

## 2018-11-23 DIAGNOSIS — R1010 Upper abdominal pain, unspecified: Secondary | ICD-10-CM | POA: Diagnosis not present

## 2018-11-23 DIAGNOSIS — E785 Hyperlipidemia, unspecified: Secondary | ICD-10-CM | POA: Diagnosis present

## 2018-11-23 DIAGNOSIS — Z8249 Family history of ischemic heart disease and other diseases of the circulatory system: Secondary | ICD-10-CM | POA: Diagnosis not present

## 2018-11-23 DIAGNOSIS — I428 Other cardiomyopathies: Secondary | ICD-10-CM | POA: Diagnosis present

## 2018-11-23 DIAGNOSIS — E1159 Type 2 diabetes mellitus with other circulatory complications: Secondary | ICD-10-CM | POA: Diagnosis present

## 2018-11-23 DIAGNOSIS — I69319 Unspecified symptoms and signs involving cognitive functions following cerebral infarction: Secondary | ICD-10-CM | POA: Diagnosis not present

## 2018-11-23 DIAGNOSIS — E876 Hypokalemia: Secondary | ICD-10-CM | POA: Diagnosis present

## 2018-11-23 DIAGNOSIS — I252 Old myocardial infarction: Secondary | ICD-10-CM | POA: Diagnosis not present

## 2018-11-23 DIAGNOSIS — E1165 Type 2 diabetes mellitus with hyperglycemia: Secondary | ICD-10-CM | POA: Diagnosis present

## 2018-11-23 DIAGNOSIS — Z7901 Long term (current) use of anticoagulants: Secondary | ICD-10-CM | POA: Diagnosis not present

## 2018-11-23 DIAGNOSIS — R000 Tachycardia, unspecified: Secondary | ICD-10-CM | POA: Diagnosis not present

## 2018-11-23 DIAGNOSIS — R791 Abnormal coagulation profile: Secondary | ICD-10-CM | POA: Diagnosis present

## 2018-11-23 DIAGNOSIS — I5042 Chronic combined systolic (congestive) and diastolic (congestive) heart failure: Secondary | ICD-10-CM | POA: Diagnosis present

## 2018-11-23 DIAGNOSIS — R112 Nausea with vomiting, unspecified: Secondary | ICD-10-CM | POA: Diagnosis not present

## 2018-11-23 DIAGNOSIS — K761 Chronic passive congestion of liver: Secondary | ICD-10-CM | POA: Diagnosis present

## 2018-11-23 DIAGNOSIS — I429 Cardiomyopathy, unspecified: Secondary | ICD-10-CM | POA: Diagnosis not present

## 2018-11-23 DIAGNOSIS — K219 Gastro-esophageal reflux disease without esophagitis: Secondary | ICD-10-CM | POA: Diagnosis present

## 2018-11-23 DIAGNOSIS — Z833 Family history of diabetes mellitus: Secondary | ICD-10-CM | POA: Diagnosis not present

## 2018-11-23 DIAGNOSIS — D573 Sickle-cell trait: Secondary | ICD-10-CM | POA: Diagnosis present

## 2018-11-23 DIAGNOSIS — J302 Other seasonal allergic rhinitis: Secondary | ICD-10-CM | POA: Diagnosis present

## 2018-11-23 DIAGNOSIS — M170 Bilateral primary osteoarthritis of knee: Secondary | ICD-10-CM | POA: Diagnosis present

## 2018-11-23 DIAGNOSIS — E872 Acidosis: Secondary | ICD-10-CM | POA: Diagnosis present

## 2018-11-23 DIAGNOSIS — R1314 Dysphagia, pharyngoesophageal phase: Secondary | ICD-10-CM | POA: Diagnosis present

## 2018-11-23 DIAGNOSIS — E440 Moderate protein-calorie malnutrition: Secondary | ICD-10-CM | POA: Diagnosis present

## 2018-11-23 DIAGNOSIS — Z681 Body mass index (BMI) 19 or less, adult: Secondary | ICD-10-CM | POA: Diagnosis not present

## 2018-11-23 DIAGNOSIS — I110 Hypertensive heart disease with heart failure: Secondary | ICD-10-CM | POA: Diagnosis present

## 2018-11-23 DIAGNOSIS — I513 Intracardiac thrombosis, not elsewhere classified: Secondary | ICD-10-CM | POA: Diagnosis present

## 2018-11-23 DIAGNOSIS — R1084 Generalized abdominal pain: Secondary | ICD-10-CM | POA: Diagnosis not present

## 2018-11-23 LAB — GLUCOSE, CAPILLARY
GLUCOSE-CAPILLARY: 198 mg/dL — AB (ref 70–99)
Glucose-Capillary: 244 mg/dL — ABNORMAL HIGH (ref 70–99)
Glucose-Capillary: 224 mg/dL — ABNORMAL HIGH (ref 70–99)
Glucose-Capillary: 234 mg/dL — ABNORMAL HIGH (ref 70–99)

## 2018-11-23 MED ORDER — METOCLOPRAMIDE HCL 5 MG/ML IJ SOLN
5.0000 mg | Freq: Three times a day (TID) | INTRAMUSCULAR | Status: DC
  Administered 2018-11-23 – 2018-11-24 (×5): 5 mg via INTRAVENOUS
  Filled 2018-11-23 (×5): qty 2

## 2018-11-23 MED ORDER — FUROSEMIDE 40 MG PO TABS
40.0000 mg | ORAL_TABLET | Freq: Every day | ORAL | Status: DC
  Administered 2018-11-23 – 2018-11-24 (×2): 40 mg via ORAL
  Filled 2018-11-23 (×2): qty 1

## 2018-11-23 NOTE — Progress Notes (Signed)
PROGRESS NOTE    Kristen Lozano  OIZ:[id number] DOB: 04-03-1956 DOA: 11/21/2018 PCP: Audley Hose, MD   Brief Narrative:  Kristen Lozano a 62 y.o.femalewith medical history significant ofH/O stroke, HTN, HLD, DM2, HFrEF (EF of around 20%), arthritis, anxiety, LV thrombus on coumadin who presents for abdominal pain, nausea and vomiting. She reports symptoms started about 2 days ago. She has not been able to keep anything down including pills and food. Her emesis comes every time she has tried to eat, is NBNB and consists of the food or pills she consumed. She also reports 2 days of loose stools.  She was unresponsive to Zofran in the emergency department and was referred to [place] for further evaluation and management.  Patient's husband reports that she eats what she likes especially sweets.  He feels that they are upsetting her stomach and causing her to be ill.  He is a very well-meaning gentleman who loves his wife a great deal clearly.  Wants to keep her around as long as possible.  Was recently seen by the cardiologist who ordered ivabradine 43m BID to help treat her tachycardia associated with her congestive heart failure.  Unfortunately her insurance has not allowed prior authorization and this medicine has not yet been obtained.  Patient has a long history of tachycardia. In the emergency department given she had some tachycardia and elevated white cell and a lactic acid there was concern for sepsis however on evaluation by internal medicine we do not feel the patient has sepsis at all.  She has had some residual effects of her stroke years ago.  She is apparently minimally verbal but gets around quite well.  In fact her fingernails and toenails are expertly painted.  Clearly takes good care of herself as does her husband.  He states that she is able to cook and do most ADLs.  She remains nauseous today unable to really eat much.  She is being converted from observation to  inpatient.  Assessment & Plan:   Active Problems:   Essential hypertension   Nonischemic cardiomyopathy (HCC)   LV (left ventricular) mural thrombus following MI (HCC)   Hypokalemia   Uncontrolled type 2 diabetes mellitus with hyperglycemia (HCC)   Nausea & vomiting   Abdominal pain   HLD (hyperlipidemia)   GERD (gastroesophageal reflux disease)  Nausea & vomitingandAbdominal pain, leukocytosis - This has been a chronic issues with extensive work up in the past including EGD and gastric emptying study without clear cause. CT scan of the abdomen noted in November showing liver congestion - Will treat conservatively with  promethazine, oxycodone and morphine for pain and PPI.  Patient does not respond to Zofran treatment.Last evaluation showed that GI recommended treating with BID zofran and PPI, but patient did not do well with that regimen.She was supposed to follow up with GI, but I do not see that she did. This could also be due to increased sweets and dietary issues - IVF with NS and KCl at 100cc/hr for 5 hours and judiciously and she has completed these. - SLP evaluation Lactate has resolved. She is tolerating her p.o. pills.  Diet increased to soft foods yesterday but patient tolerating very little. KUB was obtained due to her poor appetite.  Does not show a lot of stool burden.  Doubt constipation as an issue.  Essential hypertension - Blood pressure has been variable, she has not been able to keep medications down today. - Will continue IV metoprolol PRN and  hydralazine PRN for very high blood pressures - She has had elevated HR while here, which I think is neck related to her very poor ejection fraction and exacerbated by not taking her BB for 2 days - Discussed with pharmacy, and apparently her husband is crushing the long acting metoprolol, which is not recommended - Transition metoprolol to tartrate formulation, same dose. Once she is able to tolerate PO, monitor to  see if HR improves with treatment.   Nonischemic cardiomyopathy(EF of 20-25% on most recent TTE) - EF is 20-25%, she does not appear to have volume overload at this time, however, she does have pleural effusions noted on CXR. Will need to be careful with fluid administration -We will restart Lasix today and check a BMP in a.m. monitor closely for dehydration.  LV (left ventricular) mural thrombus following MI - Transition to lovenox until she can tolerate coumadin  Hypokalemia -Resolved  Uncontrolled type 2 diabetes mellitus with hyperglycemia - SSI started, monitor PO intake - Monitor for hypoglycemia -reStart home nocturnal insulin dosing  HLD (hyperlipidemia) - Hold statin until she can eat again  GERD (gastroesophageal reflux disease) - Continue PPI, continue sucralfate  Life care plan: Had a very long extensive discussion with the husband that I do not expect her health status in general to improve.  Given that she has congestive heart failure and that she has had a stroke for which she has had residual deficits now for 3 years it is unlikely that she will be able to improve.  I explained to him that I do believe that she will have episodes of worsening and then stabilization followed by worsening and stabilization.  He is very clear that he wants to do everything to keep her alive as long as possible.  He wants all measures employed to do so.  DVT prophylaxis:Lovenox Code Status:Full Disposition Plan:Admit for treatment of nausea, pain Consults called:None, consider GI if not improving Admission status:Cardiac telemetry inpatient   Subjective: Alert and awake today answering some questions.  Still very uncomfortable.  Unable to eat very much.  Objective: Vitals:   11/23/18 0150 11/23/18 0453 11/23/18 0900 11/23/18 0901  BP: (!) 135/96 (!) 152/104 (!) 155/114 (!) 155/114  Pulse: (!) 52 (!) 110 (!) 126 (!) 124  Resp: (!) 23 (!) 21 15   Temp:  98.3 F  (36.8 C)    TempSrc:  Oral    SpO2: 98% 97% 97%   Weight:  49.8 kg    Height:        Intake/Output Summary (Last 24 hours) at 11/23/2018 1349 Last data filed at 11/23/2018 0844 Gross per 24 hour  Intake 480 ml  Output -  Net 480 ml   Filed Weights   11/21/18 0841 11/22/18 0559 11/23/18 0453  Weight: 49.9 kg 48.6 kg 49.8 kg    Examination:  General exam: Appears calm and comfortable  Respiratory system: Clear to auscultation. Respiratory effort normal. Cardiovascular system: S1 & S2 heard, RRR. No JVD, murmurs, rubs, gallops or clicks. No pedal edema. Gastrointestinal system: Abdomen is nondistended, soft and nontender. No organomegaly or masses felt. Normal bowel sounds heard. Central nervous system: Alert and oriented. No focal neurological deficits. Extremities: Symmetric 5 x 5 power. Skin: No rashes, lesions or ulcers Psychiatry: Judgement and insight appear normal. Mood & affect appropriate.     Data Reviewed: I have personally reviewed following labs and imaging studies  CBC: Recent Labs  Lab 11/21/18 0841 11/22/18 0542  WBC 13.0* 10.5  NEUTROABS 10.1*  --   HGB 14.3 12.1  HCT 44.3 37.2  MCV 65.4* 65.1*  PLT 275 981   Basic Metabolic Panel: Recent Labs  Lab 11/21/18 0841 11/22/18 0542  NA 139 138  K 3.1* 3.7  CL 97* 106  CO2 24 23  GLUCOSE 240* 353*  BUN 14 17  CREATININE 1.04* 0.94  CALCIUM 9.6 8.6*   GFR: Estimated Creatinine Clearance: 48.2 mL/min (by C-G formula based on SCr of 0.94 mg/dL). Liver Function Tests: Recent Labs  Lab 11/21/18 0841 11/22/18 0542  AST 36 113*  ALT 37 90*  ALKPHOS 60 59  BILITOT 1.5* 1.0  PROT 8.5* 6.3*  ALBUMIN 4.1 3.0*   Recent Labs  Lab 11/21/18 0841  LIPASE 28   No results for input(s): AMMONIA in the last 168 hours. Coagulation Profile: Recent Labs  Lab 11/21/18 0841  INR 1.64   Cardiac Enzymes: No results for input(s): CKTOTAL, CKMB, CKMBINDEX, TROPONINI in the last 168 hours. BNP (last 3  results) No results for input(s): PROBNP in the last 8760 hours. HbA1C: Recent Labs    11/22/18 0608  HGBA1C 8.3*   CBG: Recent Labs  Lab 11/22/18 1152 11/22/18 1629 11/22/18 2218 11/23/18 0751 11/23/18 1129  GLUCAP 284* 171* 146* 198* 244*   Lipid Profile: No results for input(s): CHOL, HDL, LDLCALC, TRIG, CHOLHDL, LDLDIRECT in the last 72 hours. Thyroid Function Tests: Recent Labs    11/21/18 0932  TSH 1.731   Anemia Panel: No results for input(s): VITAMINB12, FOLATE, FERRITIN, TIBC, IRON, RETICCTPCT in the last 72 hours. Sepsis Labs: Recent Labs  Lab 11/21/18 0856 11/21/18 1043 11/21/18 1823 11/21/18 2028  LATICACIDVEN 3.16* 2.22* 2.0* 1.7    Recent Results (from the past 240 hour(s))  Blood Culture (routine x 2)     Status: None (Preliminary result)   Collection Time: 11/21/18  9:30 AM  Result Value Ref Range Status   Specimen Description BLOOD RIGHT ANTECUBITAL  Final   Special Requests   Final    BOTTLES DRAWN AEROBIC AND ANAEROBIC Blood Culture results may not be optimal due to an inadequate volume of blood received in culture bottles   Culture   Final    NO GROWTH 1 DAY Performed at [facility] [address], [place], [place] [address]    Report Status PENDING  Incomplete  Blood Culture (routine x 2)     Status: None (Preliminary result)   Collection Time: 11/21/18 10:37 AM  Result Value Ref Range Status   Specimen Description BLOOD LEFT ANTECUBITAL  Final   Special Requests   Final    BOTTLES DRAWN AEROBIC AND ANAEROBIC Blood Culture adequate volume   Culture   Final    NO GROWTH < 24 HOURS Performed at [facility], [place] [address], [place], [place] [address]    Report Status PENDING  Incomplete  Urine culture     Status: None   Collection Time: 11/21/18 11:46 AM  Result Value Ref Range Status   Specimen Description URINE, CLEAN CATCH  Final   Special Requests Normal  Final   Culture   Final    NO GROWTH Performed at [facility], [address], [place], [place] [address]    Report Status 11/22/2018 FINAL  Final         Radiology Studies: Dg Abd 1 View  Result Date: 11/23/2018 CLINICAL DATA:  Abdominal pain, nausea, vomiting EXAM: ABDOMEN - 1 VIEW COMPARISON:  None. FINDINGS:  The bowel gas pattern is normal. No radio-opaque calculi or other significant radiographic abnormality are seen. IMPRESSION: Negative. Electronically Signed   By: Kathreen Devoid   On: 11/23/2018 10:06        Scheduled Meds: . aspirin  81 mg Oral Daily  . atorvastatin  20 mg Oral q1800  . enoxaparin (LOVENOX) injection  1 mg/kg Subcutaneous Q12H  . feeding supplement (ENSURE ENLIVE)  237 mL Oral TID BM  . insulin aspart  0-5 Units Subcutaneous QHS  . insulin aspart  0-9 Units Subcutaneous TID WC  . insulin glargine  10 Units Subcutaneous QHS  . losartan  12.5 mg Oral Daily  . metoCLOPramide (REGLAN) injection  5 mg Intravenous TID AC & HS  . metoprolol tartrate  100 mg Oral q morning - 10a  . metoprolol tartrate  50 mg Oral QPM  . pantoprazole  40 mg Oral Q supper  . sucralfate  1 g Oral TID WC & HS   Continuous Infusions:   LOS: 0 days    Time spent: 35 minutes    Lady Deutscher, MD FACP [facility] Pager [phone]  If 7PM-7AM, please contact night-coverage [email or web address] Password [email or web address] 11/23/2018, 1:49 PM

## 2018-11-24 ENCOUNTER — Encounter (HOSPITAL_COMMUNITY): Payer: Self-pay | Admitting: *Deleted

## 2018-11-24 DIAGNOSIS — E1159 Type 2 diabetes mellitus with other circulatory complications: Secondary | ICD-10-CM

## 2018-11-24 DIAGNOSIS — R000 Tachycardia, unspecified: Secondary | ICD-10-CM

## 2018-11-24 DIAGNOSIS — I429 Cardiomyopathy, unspecified: Secondary | ICD-10-CM

## 2018-11-24 LAB — GLUCOSE, CAPILLARY
Glucose-Capillary: 168 mg/dL — ABNORMAL HIGH (ref 70–99)
Glucose-Capillary: 230 mg/dL — ABNORMAL HIGH (ref 70–99)

## 2018-11-24 MED ORDER — PROMETHAZINE HCL 12.5 MG PO TABS: 12.5000 mg | ORAL_TABLET | Freq: Four times a day (QID) | ORAL | 0 refills | Status: DC | PRN

## 2018-11-24 MED ORDER — ENSURE ENLIVE PO LIQD: 237.0000 mL | Freq: Three times a day (TID) | ORAL | 12 refills | Status: DC

## 2018-11-24 MED ORDER — OXYCODONE HCL 5 MG PO TABS: 5.0000 mg | ORAL_TABLET | Freq: Four times a day (QID) | ORAL | 0 refills | Status: DC | PRN

## 2018-11-24 MED ORDER — METOCLOPRAMIDE HCL 5 MG PO TABS: 5.0000 mg | ORAL_TABLET | Freq: Three times a day (TID) | ORAL | 0 refills | Status: DC | PRN

## 2018-11-24 NOTE — Progress Notes (Signed)
Inpatient Diabetes Program Recommendations  AACE/ADA: New Consensus Statement on Inpatient Glycemic Control (2015)  Target Ranges:  Prepandial:   less than 140 mg/dL      Peak postprandial:   less than 180 mg/dL (1-2 hours)      Critically ill patients:  140 - 180 mg/dL   Lab Results  Component Value Date   GLUCAP 168 (H) 11/24/2018   HGBA1C 8.3 (H) 11/22/2018    Review of Glycemic Control Results for MORENA, MCKISSACK (MRN [id number]) as of 11/24/2018 10:42  Ref. Range 11/23/2018 11:29 11/23/2018 16:41 11/23/2018 21:37 11/24/2018 07:49  Glucose-Capillary Latest Ref Range: 70 - 99 mg/dL 244 (H) 224 (H) 234 (H) 168 (H)   Diabetes history: Type 2 DM Outpatient Diabetes medications: Metformin 1000 mg BID Current orders for Inpatient glycemic control: Lantus 10 units QHS, Novolog 0-9 units TID, Novolog 0-5 units QHS  Inpatient Diabetes Program Recommendations:    For increased post prandials, consider adding Novolog 3 units TID (assuming that patient is consuming >50% of meals).  Thanks, Bronson Curb, MSN, RNC-OB Diabetes Coordinator [phone] (8a-5p)

## 2018-11-24 NOTE — Discharge Summary (Signed)
Physician Discharge Summary  Kristen Lozano OBS:[id number] DOB: Feb 19, 1956 DOA: 11/21/2018  PCP: Audley Hose, MD  Admit date: 11/21/2018 Discharge date: 11/24/2018  Admitted From: Home Disposition: Home  Recommendations for Outpatient Follow-up:  1. Follow up with PCP in 1-2 weeks 2. Outpatient referral to GI if nausea and vomiting with abdominal pain persists.   Home Health: None Equipment/Devices: None  Discharge Condition: Fair CODE STATUS: Full code (goals of care were discussed by hospitalist with patient and her husband during this admission. wished for full scope of treatment).  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  Symptoms started about 2 days ago and was unable to keep anything down.  Patient has chronic dysphagia and has had multiple work-up done by GI in the past.  Reportedly patient is not adherent to recommended diet.  Patient also has chronic tachycardia and was ordered for ivabradine 5 mg twice daily I have cardiology during recent visit however her insurance did not allow prior  authorization and she has not obtained this medicine yet.  In the ED patient had tachycardia with elevated lactic acid of 3.1 and elevated WBC initially concern for sepsis however this was ruled out. Patient admitted for further management.   Hospital course Nausea and vomiting with abdominal pain Reportedly has chronic nausea and vomiting and had work-up done in the past including EGD and gastric emptying study without clear diagnosis.  CT of the abdomen was done in November showed some liver congestion. Patient treated with supportive care including PRN Phenergan and Reglan, PPI.  Given gentle hydration. Lactic acid and WBCs normalized. She is now tolerating soft diet.  KUB without any stool burden. She is no longer nauseous and has not had vomiting since yesterday.  Abdominal pain also has significantly improved. Patient instructed to avoid sweet intake and slowly advance soft diet.  I will discharge her on PRN Phenergan and Reglan and a short course of PRN oxycodone as needed for abdominal pain  Sinus tachycardia Chronic.  Heart rate occasionally up to 120s.  Continue metoprolol 150 mg in divided doses. Outpatient follow-up with cardiology.  Nonischemic cardiomyopathy Recent 2D echo with EF of 20-25%.  Was hypovolemic on presentation.  Resume Lasix.  Continue beta-blocker, statin and ARB.  Left ventricular mural thrombus following MI Resume Coumadin.  Hypokalemia Replenished  Controlled type 2 diabetes mellitus with hyperglycemia Resume home dose insulin.  Lactic acid resolved and will resume metformin upon discharge.  GERD Continue PPI and Carafate.  Procedure: None   Consults: None  Disposition: Home  Discharge Instructions   Allergies as of 11/24/2018   No Known Allergies     Medication List    STOP taking  these medications   ondansetron 4 MG tablet Commonly known as:  ZOFRAN   polyethylene glycol packet Commonly known as:  MIRALAX / GLYCOLAX      TAKE these medications   acetaminophen 325 MG tablet Commonly known as:  TYLENOL Take 650 mg by mouth every 6 (six) hours as needed for mild pain or headache.   ASPIRIN LOW DOSE 81 MG EC tablet Generic drug:  aspirin Take 1 tablet (81 mg total) by mouth daily. What changed:  See the new instructions.   atorvastatin 20 MG tablet Commonly known as:  LIPITOR Take 1 tablet (20 mg total) by mouth daily at 6 PM. Please schedule appointment for refills.   docusate sodium 100 MG capsule Commonly known as:  COLACE Take 1 capsule (100 mg total) by mouth 2 (two) times daily as needed for mild constipation.   feeding supplement (ENSURE ENLIVE) Liqd Take 237 mLs by mouth 3 (three) times daily between meals. What changed:  Another medication with the same name was added. Make sure you understand how and when to take each.   feeding supplement (ENSURE ENLIVE) Liqd Take 237 mLs by mouth 3 (three) times daily between meals. What changed:  You were already taking a medication with the same name, and this prescription was added. Make sure you understand how and when to take each.   furosemide 40 MG tablet Commonly known as:  LASIX Take 1 tablet (40 mg total) by mouth daily.   guaiFENesin 100 MG/5ML Soln Commonly known as:  ROBITUSSIN Take 15 mLs (300 mg total) by mouth 4 (four) times daily. What changed:    when to take this  reasons to take this   insulin glargine 100 UNIT/ML injection Commonly known as:  LANTUS Inject 0.1 mLs (10 Units total) into the skin at bedtime.   ivabradine 5 MG Tabs tablet Commonly known as:  CORLANOR Take 1 tablet (5 mg total) by mouth 2 (two) times daily with a meal.   losartan 25 MG tablet Commonly known as:  COZAAR Take 0.5 tablets (12.5 mg total) by mouth daily.   metFORMIN 500 MG 24 hr tablet Commonly known as:  GLUCOPHAGE-XR Take 2 tablets (1,000 mg total) by mouth 2 (two) times daily.   metoCLOPramide 5 MG tablet Commonly known as:   REGLAN Take 1 tablet (5 mg total) by mouth every 8 (eight) hours as needed for nausea.   metoprolol succinate 50 MG 24 hr tablet Commonly known as:  TOPROL-XL Take 1 tablet (50 mg total) by mouth at bedtime. Take with or immediately following a meal.   metoprolol succinate 100 MG 24 hr tablet Commonly known as:  TOPROL-XL Take 1 tablet (100 mg total) by mouth daily. Take with or immediately following a meal.  In the Morning   oxyCODONE 5 MG immediate release tablet Commonly known as:  Oxy IR/ROXICODONE Take 1 tablet (5 mg total) by mouth every 6 (six) hours as needed for moderate pain.   pantoprazole 40 MG tablet Commonly known as:  PROTONIX Take 1 tablet (40 mg total) by mouth daily with supper.   potassium chloride 10 MEQ tablet Commonly known as:  K-DUR Take 1 tablet (10 mEq total) by mouth daily.   promethazine 12.5 MG tablet Commonly known as:  PHENERGAN Take 1 tablet (12.5 mg total) by mouth every 6 (six) hours as needed for nausea or vomiting.   sucralfate 1 GM/10ML suspension Commonly known as:  CARAFATE Take 10 mLs (1 g total) by mouth 4 (four)  times daily -  with meals and at bedtime for 10 days.   warfarin 5 MG tablet Commonly known as:  COUMADIN Take as directed. If you are unsure how to take this medication, talk to your nurse or doctor. Original instructions:  Take 1 to 1.5 tablets by mouth daily as directed by coumadin clinic  Take 1.5 pills on Mondays and Fridays and 1 pill Tuesday, Wed. THursday, Saturday and Sunday What changed:    how much to take  how to take this  when to take this  additional instructions      Follow-up Information    Bakare, Mobolaji B, MD. Schedule an appointment as soon as possible for a visit in 1 week(s).   Specialty:  Internal Medicine Contact information: [place] [address] [phone]        Pixie Casino, MD .   Specialty:  Cardiology Contact information: [address] [place] [address] [phone]          No Known Allergies      Procedures/Studies: Dg Chest 2 View  Result Date: 11/21/2018 CLINICAL DATA:  Patient with nausea, vomiting and diarrhea EXAM: CHEST - 2 VIEW COMPARISON:  Chest radiograph 10/25/2018 FINDINGS: Monitoring leads overlie the patient. Stable cardiomegaly. Aortic atherosclerosis. Bilateral interstitial opacities. Small bilateral pleural effusions. Thoracic spine degenerative changes. IMPRESSION: Cardiomegaly with mild interstitial opacities favored to represent edema. Small bilateral pleural effusions. Electronically Signed   By: Lovey Newcomer M.D.   On: 11/21/2018 10:43   Dg Abd 1 View  Result Date: 11/23/2018 CLINICAL DATA:  Abdominal pain, nausea, vomiting EXAM: ABDOMEN - 1 VIEW COMPARISON:  None. FINDINGS: The bowel gas pattern is normal. No radio-opaque calculi or other significant radiographic abnormality are seen. IMPRESSION: Negative. Electronically Signed   By: Kathreen Devoid   On: 11/23/2018 10:06   Mr Brain Wo Contrast  Result Date: 10/26/2018 CLINICAL DATA:  Altered level of consciousness. Generalized weakness and confusion. History of stroke, hypertension, hyperlipidemia, diabetes. EXAM: MRI HEAD WITHOUT CONTRAST TECHNIQUE: Multiplanar, multiecho pulse sequences of the brain and surrounding structures were obtained without intravenous contrast. COMPARISON:  CT HEAD October 25, 2018 and MRI of the head September 05, 2014. FINDINGS: INTRACRANIAL CONTENTS: No reduced diffusion to suggest acute ischemia. No susceptibility artifact to suggest hemorrhage. Old LEFT basal ganglia infarct with ex vacuo dilatation LEFT lateral ventricle. Mild global parenchymal brain volume loss. No hydrocephalus. LEFT temporoparietal and RIGHT parietal encephalomalacia. Old small bilateral cerebellar infarcts. No midline shift, mass effect or masses. Patchy supratentorial white matter FLAIR T2 hyperintensities. No abnormal  extra-axial fluid collections. VASCULAR: Normal major intracranial vascular flow voids present at skull base. RIGHT ICA is asymmetrically smaller. SKULL AND UPPER CERVICAL SPINE: No abnormal sellar expansion. No suspicious calvarial bone marrow signal. Severe C3-4 spondylosis. Craniocervical junction maintained. SINUSES/ORBITS: The mastoid air-cells and included paranasal sinuses are well-aerated.The included ocular globes and orbital contents are non-suspicious. Status post RIGHT ocular lens implant. OTHER: Multiple absent teeth. IMPRESSION: 1. No acute intracranial process. 2. Old bilateral MCA territory infarcts. Old small cerebellar infarcts. 3. Mild chronic small vessel ischemic changes. 4. Mild parenchymal brain volume loss, advanced for age. Electronically Signed   By: Elon Alas M.D.   On: 10/26/2018 22:01    (Echo, Carotid, EGD, Colonoscopy, ERCP)    Subjective:   Discharge Exam: Vitals:   11/24/18 0506 11/24/18 0827  BP: (!) 164/100 (!) 159/101  Pulse: (!) 117 (!) 121  Resp: 15  Temp: 99.3 F (37.4 C)   SpO2: 96%    Vitals:   11/23/18 2018 11/23/18 2357 11/24/18 0506 11/24/18 0827  BP: 117/78  (!) 164/100 (!) 159/101  Pulse: (!) 101 87 (!) 117 (!) 121  Resp: 16 18 15    Temp: 98.2 F (36.8 C)  99.3 F (37.4 C)   TempSrc: Oral  Oral   SpO2: 97% 94% 96%   Weight:   48.3 kg   Height:        General: In no acute distress, fatigue HEENT: Moist mucosa, supple neck Chest: Clear bilaterally CVs: Normal S1-S2, no murmurs GI: Soft, nondistended, nontender, bowel sounds present Musculoskeletal: Warm, no edema CNS: Alert and oriented     The results of significant diagnostics from this hospitalization (including imaging, microbiology, ancillary and laboratory) are listed below for reference.     Microbiology: Recent Results (from the past 240 hour(s))  Blood Culture (routine x 2)     Status: None (Preliminary result)   Collection Time: 11/21/18  9:30 AM  Result  Value Ref Range Status   Specimen Description BLOOD RIGHT ANTECUBITAL  Final   Special Requests   Final    BOTTLES DRAWN AEROBIC AND ANAEROBIC Blood Culture results may not be optimal due to an inadequate volume of blood received in culture bottles   Culture   Final    NO GROWTH 3 DAYS Performed at [facility], [place] [address], [place], [place] [address]    Report Status PENDING  Incomplete  Blood Culture (routine x 2)     Status: None (Preliminary result)   Collection Time: 11/21/18 10:37 AM  Result Value Ref Range Status   Specimen Description BLOOD LEFT ANTECUBITAL  Final   Special Requests   Final    BOTTLES DRAWN AEROBIC AND ANAEROBIC Blood Culture adequate volume   Culture   Final    NO GROWTH 3 DAYS Performed at [facility], [place] [address], [place], [place] [address]    Report Status PENDING  Incomplete  Urine culture     Status: None   Collection Time: 11/21/18 11:46 AM  Result Value Ref Range Status   Specimen Description URINE, CLEAN CATCH  Final   Special Requests Normal  Final   Culture   Final    NO GROWTH Performed at [facility], [address], [place],  [address]    Report Status 11/22/2018 FINAL  Final     Labs: BNP (last 3 results) Recent Labs    10/11/18 2051 10/19/18 2051 10/25/18 1053  BNP 957.6* 495.6* 568.1*   Basic Metabolic Panel: Recent Labs  Lab 11/21/18 0841 11/22/18 0542  NA 139 138  K 3.1* 3.7  CL 97* 106  CO2 24 23  GLUCOSE 240* 353*  BUN 14 17  CREATININE 1.04* 0.94  CALCIUM 9.6 8.6*   Liver Function Tests: Recent Labs  Lab 11/21/18 0841 11/22/18 0542  AST 36 113*  ALT 37 90*  ALKPHOS 60 59  BILITOT 1.5* 1.0  PROT 8.5* 6.3*  ALBUMIN 4.1 3.0*   Recent Labs  Lab 11/21/18 0841  LIPASE 28   No results for input(s): AMMONIA in the last 168 hours. CBC: Recent Labs  Lab 11/21/18 0841 11/22/18 0542  WBC 13.0* 10.5  NEUTROABS 10.1*  --   HGB 14.3 12.1  HCT 44.3 37.2  MCV 65.4* 65.1*   PLT 275 222   Cardiac Enzymes: No results for input(s): CKTOTAL, CKMB, CKMBINDEX, TROPONINI in the  last 168 hours. BNP: Invalid input(s): POCBNP CBG: Recent Labs  Lab 11/23/18 1129 11/23/18 1641 11/23/18 2137 11/24/18 0749 11/24/18 1107  GLUCAP 244* 224* 234* 168* 230*   D-Dimer No results for input(s): DDIMER in the last 72 hours. Hgb A1c Recent Labs    11/22/18 0608  HGBA1C 8.3*   Lipid Profile No results for input(s): CHOL, HDL, LDLCALC, TRIG, CHOLHDL, LDLDIRECT in the last 72 hours. Thyroid function studies No results for input(s): TSH, T4TOTAL, T3FREE, THYROIDAB in the last 72 hours.  Invalid input(s): FREET3 Anemia work up No results for input(s): VITAMINB12, FOLATE, FERRITIN, TIBC, IRON, RETICCTPCT in the last 72 hours. Urinalysis    Component Value Date/Time   COLORURINE YELLOW 11/21/2018 [place] 11/21/2018 1148   LABSPEC 1.024 11/21/2018 1148   PHURINE 5.0 11/21/2018 1148   GLUCOSEU >=500 (A) 11/21/2018 1148   HGBUR NEGATIVE 11/21/2018 1148   BILIRUBINUR NEGATIVE 11/21/2018 1148   KETONESUR 20 (A) 11/21/2018 1148   PROTEINUR 100 (A) 11/21/2018 1148   UROBILINOGEN 1.0 09/07/2014 1405   NITRITE NEGATIVE 11/21/2018 1148   LEUKOCYTESUR NEGATIVE 11/21/2018 1148   Sepsis Labs Invalid input(s): PROCALCITONIN,  WBC,  LACTICIDVEN Microbiology Recent Results (from the past 240 hour(s))  Blood Culture (routine x 2)     Status: None (Preliminary result)   Collection Time: 11/21/18  9:30 AM  Result Value Ref Range Status   Specimen Description BLOOD RIGHT ANTECUBITAL  Final   Special Requests   Final    BOTTLES DRAWN AEROBIC AND ANAEROBIC Blood Culture results may not be optimal due to an inadequate volume of blood received in culture bottles   Culture   Final    NO GROWTH 3 DAYS Performed at [facility], [place] [address], [place], [place] [address]    Report Status PENDING  Incomplete  Blood Culture (routine x 2)     Status: None  (Preliminary result)   Collection Time: 11/21/18 10:37 AM  Result Value Ref Range Status   Specimen Description BLOOD LEFT ANTECUBITAL  Final   Special Requests   Final    BOTTLES DRAWN AEROBIC AND ANAEROBIC Blood Culture adequate volume   Culture   Final    NO GROWTH 3 DAYS Performed at [facility], [place] [address], [place], [place] [address]    Report Status PENDING  Incomplete  Urine culture     Status: None   Collection Time: 11/21/18 11:46 AM  Result Value Ref Range Status   Specimen Description URINE, CLEAN CATCH  Final   Special Requests Normal  Final   Culture   Final    NO GROWTH Performed at [facility], [address], [place], [place] [address]    Report Status 11/22/2018 FINAL  Final     Time coordinating discharge: <30 minutes  SIGNED:   Louellen Molder, MD  [facility] 11/24/2018, 11:58 AM Pager   If 7PM-7AM, please contact night-coverage [email or web address] Password [email or web address]

## 2018-11-26 LAB — CULTURE, BLOOD (ROUTINE X 2)
Culture: NO GROWTH
Culture: NO GROWTH
Special Requests: ADEQUATE

## 2018-12-09 ENCOUNTER — Emergency Department (HOSPITAL_COMMUNITY)
Admission: EM | Admit: 2018-12-09 | Discharge: 2018-12-09 | Disposition: A | Discharge status: Home / Self Care | Payer: Medicare HMO | Attending: Emergency Medicine | Admitting: Emergency Medicine

## 2018-12-09 ENCOUNTER — Encounter (HOSPITAL_COMMUNITY): Payer: Self-pay | Admitting: Emergency Medicine

## 2018-12-09 ENCOUNTER — Emergency Department (HOSPITAL_COMMUNITY): Payer: Medicare HMO

## 2018-12-09 ENCOUNTER — Other Ambulatory Visit: Payer: Self-pay

## 2018-12-09 DIAGNOSIS — Z7982 Long term (current) use of aspirin: Secondary | ICD-10-CM | POA: Diagnosis not present

## 2018-12-09 DIAGNOSIS — I110 Hypertensive heart disease with heart failure: Secondary | ICD-10-CM | POA: Insufficient documentation

## 2018-12-09 DIAGNOSIS — Z7901 Long term (current) use of anticoagulants: Secondary | ICD-10-CM | POA: Diagnosis not present

## 2018-12-09 DIAGNOSIS — R000 Tachycardia, unspecified: Secondary | ICD-10-CM

## 2018-12-09 DIAGNOSIS — E119 Type 2 diabetes mellitus without complications: Secondary | ICD-10-CM | POA: Insufficient documentation

## 2018-12-09 DIAGNOSIS — Z794 Long term (current) use of insulin: Secondary | ICD-10-CM | POA: Insufficient documentation

## 2018-12-09 DIAGNOSIS — Z79899 Other long term (current) drug therapy: Secondary | ICD-10-CM | POA: Insufficient documentation

## 2018-12-09 DIAGNOSIS — R1110 Vomiting, unspecified: Secondary | ICD-10-CM | POA: Diagnosis present

## 2018-12-09 DIAGNOSIS — I5042 Chronic combined systolic (congestive) and diastolic (congestive) heart failure: Secondary | ICD-10-CM | POA: Diagnosis not present

## 2018-12-09 LAB — COMPREHENSIVE METABOLIC PANEL
ALK PHOS: 50 U/L (ref 38–126)
ALT: 34 U/L (ref 0–44)
AST: 25 U/L (ref 15–41)
Albumin: 3.7 g/dL (ref 3.5–5.0)
Anion gap: 13 (ref 5–15)
BILIRUBIN TOTAL: 1.4 mg/dL — AB (ref 0.3–1.2)
BUN: 8 mg/dL (ref 8–23)
CO2: 27 mmol/L (ref 22–32)
CREATININE: 0.75 mg/dL (ref 0.44–1.00)
Calcium: 9.2 mg/dL (ref 8.9–10.3)
Chloride: 98 mmol/L (ref 98–111)
GFR calc Af Amer: >60 mL/min (ref >60)
GFR calc non Af Amer: >60 mL/min (ref >60)
Glucose, Bld: 178 mg/dL — ABNORMAL HIGH (ref 70–99)
Potassium: 3.5 mmol/L (ref 3.5–5.1)
Sodium: 138 mmol/L (ref 135–145)
Total Protein: 7 g/dL (ref 6.5–8.1)

## 2018-12-09 LAB — URINALYSIS, ROUTINE W REFLEX MICROSCOPIC
Bacteria, UA: NONE SEEN
Bilirubin Urine: NEGATIVE
Glucose, UA: NEGATIVE mg/dL
Hgb urine dipstick: NEGATIVE
Ketones, ur: 20 mg/dL — AB
Leukocytes, UA: NEGATIVE
Nitrite: NEGATIVE
Protein, ur: >=300 mg/dL — AB
Specific Gravity, Urine: 1.012 (ref 1.005–1.030)
pH: 7 (ref 5.0–8.0)

## 2018-12-09 LAB — PROTIME-INR
INR: 1.12
Prothrombin Time: 14.3 seconds (ref 11.4–15.2)

## 2018-12-09 LAB — CBC
HCT: 43.8 % (ref 36.0–46.0)
Hemoglobin: 14 g/dL (ref 12.0–15.0)
MCH: 21.4 pg — AB (ref 26.0–34.0)
MCHC: 32 g/dL (ref 30.0–36.0)
MCV: 67.1 fL — ABNORMAL LOW (ref 80.0–100.0)
Platelets: 254 10*3/uL (ref 150–400)
RBC: 6.53 MIL/uL — ABNORMAL HIGH (ref 3.87–5.11)
RDW: 18.5 % — ABNORMAL HIGH (ref 11.5–15.5)
WBC: 6.2 10*3/uL (ref 4.0–10.5)
nRBC: 0 % (ref 0.0–0.2)

## 2018-12-09 LAB — LIPASE, BLOOD: Lipase: 30 U/L (ref 11–51)

## 2018-12-09 MED ORDER — METOPROLOL SUCCINATE ER 50 MG PO TB24: 50.0000 mg | ORAL_TABLET | Freq: Every day | ORAL | Status: DC

## 2018-12-09 MED ORDER — METOPROLOL TARTRATE 5 MG/5ML IV SOLN: 5.0000 mg | Freq: Once | INTRAVENOUS | Status: DC

## 2018-12-09 MED ORDER — METOPROLOL TARTRATE 5 MG/5ML IV SOLN
2.5000 mg | Freq: Once | INTRAVENOUS | Status: AC
  Administered 2018-12-09: 2.5 mg via INTRAVENOUS
  Filled 2018-12-09: qty 5

## 2018-12-09 MED ORDER — SODIUM CHLORIDE 0.9 % IV SOLN
INTRAVENOUS | Status: DC
  Administered 2018-12-09: 14:00:00 via INTRAVENOUS

## 2018-12-09 MED ORDER — METOPROLOL TARTRATE 5 MG/5ML IV SOLN
5.0000 mg | Freq: Once | INTRAVENOUS | Status: AC
  Administered 2018-12-09: 5 mg via INTRAVENOUS
  Filled 2018-12-09: qty 5

## 2018-12-09 MED ORDER — SODIUM CHLORIDE 0.9% FLUSH
3.0000 mL | Freq: Once | INTRAVENOUS | Status: AC
  Administered 2018-12-09: 3 mL via INTRAVENOUS

## 2018-12-09 MED ORDER — MORPHINE SULFATE (PF) 4 MG/ML IV SOLN: 4.0000 mg | Freq: Once | INTRAVENOUS | Status: DC

## 2018-12-09 MED ORDER — ONDANSETRON 8 MG PO TBDP: 8.0000 mg | ORAL_TABLET | Freq: Three times a day (TID) | ORAL | 0 refills | Status: DC | PRN

## 2018-12-09 MED ORDER — METOPROLOL TARTRATE 5 MG/5ML IV SOLN: 2.5000 mg | Freq: Once | INTRAVENOUS | Status: DC

## 2018-12-09 MED ORDER — MORPHINE SULFATE (PF) 2 MG/ML IV SOLN
2.0000 mg | Freq: Once | INTRAVENOUS | Status: AC
  Administered 2018-12-09: 2 mg via INTRAVENOUS
  Filled 2018-12-09: qty 1

## 2018-12-09 MED ORDER — METOCLOPRAMIDE HCL 5 MG/ML IJ SOLN
5.0000 mg | Freq: Once | INTRAMUSCULAR | Status: AC
  Administered 2018-12-09: 5 mg via INTRAVENOUS
  Filled 2018-12-09: qty 2

## 2018-12-09 MED ORDER — SODIUM CHLORIDE 0.9 % IV BOLUS
500.0000 mL | Freq: Once | INTRAVENOUS | Status: AC
  Administered 2018-12-09: 500 mL via INTRAVENOUS

## 2018-12-09 MED ORDER — METOPROLOL SUCCINATE ER 100 MG PO TB24: 100.0000 mg | ORAL_TABLET | Freq: Every day | ORAL | Status: DC

## 2018-12-09 NOTE — ED Provider Notes (Signed)
[facility] DEPT Provider Note   CSN: [id number] Arrival date & time: 12/09/18  1006     History   Chief Complaint Chief Complaint  Patient presents with  . Emesis    HPI Kristen Lozano is a 63 y.o. female.  63 year old female presents with emesis x2 days which she says has been nonbilious or bloody.  Denies any fever or chills.  States that she has been unable to keep down her medications.  Has had increased heart rate due to not being able to take her metoprolol.  Also notes diffuse abdominal discomfort which she says normally is relieved by her Percocet.  Patient states long standing history of GI issues and has been seen by a gastroenterologist without diagnosis.  Denies any urinary symptoms.  No abdominal distention.  Nothing makes her symptoms better.     Past Medical History:  Diagnosis Date  . Acute combined systolic and diastolic HF (heart failure) ([place]) 10/16/2018  . Allergic rhinitis    Requires cetirizine, singulair, and fluticasone.  . Anxiety    Has been on Xanax since 2009. Uses it for stress, anxiety, and insomnia. No contract yet.  . Arthritis   . CHF (congestive heart failure) (HCC)    EF 35% after stroke, presumed ischemic  . Chronic pain    Has OA of knees B. No Xrays in echart. Not requiring narcotics.  . Colitis 12/2017  . Depression   . Diabetes mellitus    Type 2, non insulin dependent. Was dx'd prior to 2008.  Marland Kitchen Hyperglycemia   . Hyperlipemia   . Hypertension   . Sickle cell trait ([place])   . Stroke [facility]) 09/05/14   Dominant left MCA infarcts secondary to unknown embolic source     Patient Active Problem List   Diagnosis Date Noted  . General weakness 10/25/2018  . Acute combined systolic and diastolic HF (heart failure) ([place]) 10/16/2018  . HLD (hyperlipidemia) 09/21/2018  . GERD (gastroesophageal reflux disease) 09/21/2018  . Nausea and vomiting 09/08/2018  . Nausea with vomiting 09/07/2018  . Dyspnea   .  Hypomagnesemia   . Sinus tachycardia   . Malnutrition of moderate degree 08/13/2018  . Enteritis   . Pleural effusion   . Nausea 08/11/2018  . Dilated cardiomyopathy ([place])   . Nausea & vomiting 08/09/2018  . 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 Abdominal pain 08/09/2018  . Infectious colitis 01/11/2018  . Hypokalemia 01/11/2018  . Uncontrolled type 2 diabetes mellitus with hyperglycemia ([place])   . Chronic combined systolic and diastolic CHF (congestive heart failure) ([place]) 12/16/2016  . Monitoring for long-term anticoagulant use 11/14/2016  . DKA (diabetic ketoacidoses) ([place]) 11/01/2016  . Diabetes mellitus 11/01/2016  . Elevated troponin   . Nonischemic cardiomyopathy ([place])   . LV (left ventricular) mural thrombus following MI ([place])   . Cognitive deficit due to recent stroke 10/18/2014  . History of completed stroke 10/18/2014  . Abnormal stress test 10/17/2014  . Dysphagia, pharyngoesophageal phase 09/23/2014  . Abnormal CT scan 09/23/2014  . Dilated bile duct 09/23/2014  . Urinary retention 09/07/2014  . 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 Stroke ([place]) 09/05/2014  . Cerebral infarction ([place]) 09/05/2014  . Non compliance w medication regimen 08/10/2012  . Fatigue 02/18/2012  . Essential hypertension 03/22/2011  . Healthcare maintenance 03/22/2011  . Chronic pain   . ALLERGIC RHINITIS, SEASONAL 03/14/2008  . SICKLE CELL TRAIT 10/31/2006  . Anxiety state 10/31/2006  . DEPRESSION  10/31/2006    Past Surgical History:  Procedure Laterality Date  . BIOPSY  09/23/2018   Procedure: BIOPSY;  Surgeon: Yetta Flock, MD;  Location: [facility] ENDOSCOPY;  Service: Gastroenterology;;  . ESOPHAGOGASTRODUODENOSCOPY (EGD) WITH PROPOFOL N/A 09/23/2018   Procedure: ESOPHAGOGASTRODUODENOSCOPY (EGD) WITH PROPOFOL;  Surgeon: Yetta Flock, MD;  Location: [facility] ENDOSCOPY;  Service: Gastroenterology;  Laterality: N/A;  . RIGHT/LEFT HEART CATH AND  CORONARY ANGIOGRAPHY N/A 10/14/2018   Procedure: RIGHT/LEFT HEART CATH AND CORONARY ANGIOGRAPHY;  Surgeon: Troy Sine, MD;  Location: [facility];  Service: Cardiovascular;  Laterality: N/A;  . TEE WITHOUT CARDIOVERSION N/A 09/06/2014   Procedure: TRANSESOPHAGEAL ECHOCARDIOGRAM (TEE);  Surgeon: Pixie Casino, MD;  Location: [facility] ENDOSCOPY;  Service: Cardiovascular;  Laterality: N/A;     OB History   No obstetric history on file.      Home Medications    Prior to Admission medications   Medication Sig Start Date End Date Taking? Authorizing Provider  Acetaminophen 500 MG coapsule Take 500 mg by mouth daily as needed for mild pain or headache.    Yes [provider]  ASPIRIN LOW DOSE 81 MG EC tablet Take 1 tablet (81 mg total) by mouth daily. Patient taking differently: Take 81 mg by mouth daily.  01/22/18  Yes Hilty, Nadean Corwin, MD  atorvastatin (LIPITOR) 20 MG tablet Take 1 tablet (20 mg total) by mouth daily at 6 PM. Please schedule appointment for refills. 01/02/18  Yes Hilty, Nadean Corwin, MD  furosemide (LASIX) 40 MG tablet Take 1 tablet (40 mg total) by mouth daily. 10/17/18  Yes Isaiah Serge, NP  GLUCERNA (GLUCERNA) LIQD Take 237 mLs by mouth daily.   Yes [provider]  guaiFENesin (ROBITUSSIN) 100 MG/5ML SOLN Take 15 mLs (300 mg total) by mouth 4 (four) times daily. Patient taking differently: Take 15 mLs by mouth every 6 (six) hours as needed for cough.  10/16/18  Yes Isaiah Serge, NP  insulin glargine (LANTUS) 100 UNIT/ML injection Inject 0.1 mLs (10 Units total) into the skin at bedtime. 09/25/18  Yes Oswald Hillock, MD  losartan (COZAAR) 25 MG tablet Take 0.5 tablets (12.5 mg total) by mouth daily. 10/17/18  Yes Isaiah Serge, NP  metFORMIN (GLUCOPHAGE-XR) 500 MG 24 hr tablet Take 2 tablets (1,000 mg total) by mouth 2 (two) times daily. 10/17/18  Yes Isaiah Serge, NP  metoprolol succinate (TOPROL-XL) 100 MG 24 hr tablet Take 1 tablet (100 mg  total) by mouth daily. Take with or immediately following a meal.  In the Morning 10/17/18  Yes Isaiah Serge, NP  metoprolol succinate (TOPROL-XL) 50 MG 24 hr tablet Take 1 tablet (50 mg total) by mouth at bedtime. Take with or immediately following a meal. 10/16/18  Yes Isaiah Serge, NP  pantoprazole (PROTONIX) 40 MG tablet Take 1 tablet (40 mg total) by mouth daily with supper. 10/16/18  Yes Isaiah Serge, NP  potassium chloride (K-DUR) 10 MEQ tablet Take 1 tablet (10 mEq total) by mouth daily. 09/02/18  Yes Georgette Shell, MD  sucralfate (CARAFATE) 1 GM/10ML suspension Take 10 mLs (1 g total) by mouth 4 (four) times daily -  with meals and at bedtime for 10 days. 10/19/18 12/09/18 Yes Long, Wonda Olds, MD  warfarin (COUMADIN) 5 MG tablet Take 1 to 1.5 tablets by mouth daily as directed by coumadin clinic  Take 1.5 pills on Mondays and Fridays and 1 pill Tuesday, Wed. THursday, Saturday and Sunday  Patient taking differently: Take 5-7.5 mg by mouth See admin instructions. Take 1.5 pills (7.26m totally) by mouth on Mondays and Fridays and take 1 tablet (535mtotally) by mouth on Tuesday, Wed. THursday, Saturday and Sunday 10/16/18  Yes InIsaiah SergeNP  docusate sodium (COLACE) 100 MG capsule Take 1 capsule (100 mg total) by mouth 2 (two) times daily as needed for mild constipation. Patient not taking: Reported on 12/09/2018 10/16/18   InIsaiah SergeNP  feeding supplement, ENSURE ENLIVE, (ENSURE ENLIVE) LIQD Take 237 mLs by mouth 3 (three) times daily between meals. Patient not taking: Reported on 12/09/2018 08/17/18   NeOretha Milch, MD  feeding supplement, ENSURE ENLIVE, (ENSURE ENLIVE) LIQD Take 237 mLs by mouth 3 (three) times daily between meals. Patient not taking: Reported on 12/09/2018 11/24/18   Dhungel, NiFlonnie OvermanMD  ivabradine (CORLANOR) 5 MG TABS tablet Take 1 tablet (5 mg total) by mouth 2 (two) times daily with a meal. Patient not taking: Reported on 12/09/2018 10/16/18    InIsaiah SergeNP  metoCLOPramide (REGLAN) 5 MG tablet Take 1 tablet (5 mg total) by mouth every 8 (eight) hours as needed for nausea. Patient not taking: Reported on 12/09/2018 11/24/18 11/24/19  Dhungel, NiFlonnie OvermanMD  oxyCODONE (OXY IR/ROXICODONE) 5 MG immediate release tablet Take 1 tablet (5 mg total) by mouth every 6 (six) hours as needed for moderate pain. Patient not taking: Reported on 12/09/2018 11/24/18   Dhungel, NiFlonnie OvermanMD  promethazine (PHENERGAN) 12.5 MG tablet Take 1 tablet (12.5 mg total) by mouth every 6 (six) hours as needed for nausea or vomiting. Patient not taking: Reported on 12/09/2018 11/24/18   DhLouellen MolderMD    Family History Family History  Problem Relation Age of Onset  . Diabetes Mother   . Hypertension Mother   . Heart disease Father   . Heart attack Father   . Hypertension Father   . Diabetes Father   . Ovarian cancer Sister   . Liver cancer Sister   . Sickle cell anemia Daughter   . Schizophrenia Daughter   . Hypertension Sister   . Hypertension Brother   . Hypertension Daughter   . Kidney disease Sister        x2  . Kidney disease Brother   . Stroke Neg Hx   . Esophageal cancer Neg Hx   . Colon cancer Neg Hx   . Colon polyps Neg Hx     Social History Social History   Tobacco Use  . Smoking status: Never Smoker  . Smokeless tobacco: Never Used  Substance Use Topics  . Alcohol use: No    Alcohol/week: 0.0 standard drinks  . Drug use: No     Allergies   Patient has no known allergies.   Review of Systems Review of Systems  All other systems reviewed and are negative.    Physical Exam Updated Vital Signs BP (!) 139/95 (BP Location: Right Arm)   Pulse (!) 45   Temp 98.3 F (36.8 C) (Oral)   Resp 16   Ht 1.613 m (5' 3.5")   Wt 48.3 kg   SpO2 99%   BMI 18.57 kg/m   Physical Exam Vitals signs and nursing note reviewed.  Constitutional:      General: She is not in acute distress.    Appearance: Normal appearance.  She is well-developed. She is not toxic-appearing.  HENT:     Head: Normocephalic and atraumatic.  Eyes:     General: Lids are  normal.     Conjunctiva/sclera: Conjunctivae normal.     Pupils: Pupils are equal, round, and reactive to light.  Neck:     Musculoskeletal: Normal range of motion and neck supple.     Thyroid: No thyroid mass.     Trachea: No tracheal deviation.  Cardiovascular:     Rate and Rhythm: Regular rhythm. Tachycardia present.     Heart sounds: Normal heart sounds. No murmur. No gallop.   Pulmonary:     Effort: Pulmonary effort is normal. No respiratory distress.     Breath sounds: Normal breath sounds. No stridor. No decreased breath sounds, wheezing, rhonchi or rales.  Abdominal:     General: Bowel sounds are normal. There is no distension.     Palpations: Abdomen is soft.     Tenderness: There is no abdominal tenderness. There is no rebound.  Musculoskeletal: Normal range of motion.        General: No tenderness.  Skin:    General: Skin is warm and dry.     Findings: No abrasion or rash.  Neurological:     Mental Status: She is alert and oriented to person, place, and time.     GCS: GCS eye subscore is 4. GCS verbal subscore is 5. GCS motor subscore is 6.     Cranial Nerves: No cranial nerve deficit.     Sensory: No sensory deficit.  Psychiatric:        Speech: Speech normal.        Behavior: Behavior normal.      ED Treatments / Results  Labs (all labs ordered are listed, but only abnormal results are displayed) Labs Reviewed  CBC - Abnormal; Notable for the following components:      Result Value   RBC 6.53 (*)    MCV 67.1 (*)    MCH 21.4 (*)    RDW 18.5 (*)    All other components within normal limits  LIPASE, BLOOD  COMPREHENSIVE METABOLIC PANEL  URINALYSIS, ROUTINE W REFLEX MICROSCOPIC  PROTIME-INR    EKG EKG Interpretation  Date/Time:  Wednesday December 09 2018 11:07:17 EST Ventricular Rate:  129 PR Interval:    QRS  Duration: 87 QT Interval:  398 QTC Calculation: 584 R Axis:   -30 Text Interpretation:  Sinus tachycardia Ventricular trigeminy LAE, consider biatrial enlargement Left ventricular hypertrophy Nonspecific T abnormalities, lateral leads Prolonged QT interval Baseline wander in lead(s) II Confirmed by Lacretia Leigh ([id number]) on 12/09/2018 12:24:47 PM   Radiology No results found.  Procedures Procedures (including critical care time)  Medications Ordered in ED Medications  sodium chloride 0.9 % bolus 500 mL (has no administration in time range)  0.9 %  sodium chloride infusion (has no administration in time range)  metoprolol tartrate (LOPRESSOR) injection 5 mg (has no administration in time range)  morphine 2 MG/ML injection 2 mg (has no administration in time range)  metoCLOPramide (REGLAN) injection 5 mg (has no administration in time range)  sodium chloride flush (NS) 0.9 % injection 3 mL (3 mLs Intravenous Given 12/09/18 1221)     Initial Impression / Assessment and Plan / ED Course  I have reviewed the triage vital signs and the nursing notes.  Pertinent labs & imaging results that were available during my care of the patient were reviewed by me and considered in my medical decision making (see chart for details).    Patient states noncompliance with her beta-blocker which is likely for her tachycardia.  Also complaining of some  nausea abdominal discomfort and this was treated with fluids and antiemetics. Patient's blood pressure treated here with IV Lopressor x2. Patient able to take liquids at this time.  Patient is requesting to go home.  Heart rate has improved.  Did offer dose of her home blood pressure meds here and she has deferred.  She will take her home medications when she gets home  CRITICAL CARE Performed by: Leota Jacobsen Total critical care time: 50 minutes Critical care time was exclusive of separately billable procedures and treating other patients. Critical  care was necessary to treat or prevent imminent or life-threatening deterioration. Critical care was time spent personally by me on the following activities: development of treatment plan with patient and/or surrogate as well as nursing, discussions with consultants, evaluation of patient's response to treatment, examination of patient, obtaining history from patient or surrogate, ordering and performing treatments and interventions, ordering and review of laboratory studies, ordering and review of radiographic studies, pulse oximetry and re-evaluation of patient's condition.   Final Clinical Impressions(s) / ED Diagnoses   Final diagnoses:  None    ED Discharge Orders    None       Lacretia Leigh, MD 12/09/18 1530

## 2018-12-09 NOTE — ED Notes (Signed)
Per pts husband: Pt has been unable to take her medications in 2 days due to chronic abd pain.

## 2018-12-09 NOTE — ED Notes (Signed)
Bed: WTR8 Expected date:  Expected time:  Means of arrival:  Comments: Fara Chute doing EKGs

## 2018-12-09 NOTE — ED Triage Notes (Signed)
Pt complaint of n/v for 2 days; denies diarrhea. "They say if she has this for too long her heart acts up" per family. Pt denies CP; HR noted in 40s.

## 2018-12-11 ENCOUNTER — Ambulatory Visit: Payer: Medicare HMO | Admitting: Physician Assistant

## 2018-12-11 ENCOUNTER — Telehealth: Payer: Self-pay | Admitting: Gastroenterology

## 2018-12-11 NOTE — Telephone Encounter (Signed)
Left message for patient to call back  

## 2018-12-11 NOTE — Telephone Encounter (Signed)
Patient's husband reports patient had vomiting this am.  Contents are clear.  He would like to have the vomit tested for parasites.  I explained that this is not possible, but that she should keep her appt for Monday and if there is a suspicion for parasites that Amy Esterwood PA could order the appropriate testing.  They are encouraged to dispose of any fluids that they are saving.

## 2018-12-15 ENCOUNTER — Ambulatory Visit: Payer: Medicare HMO | Admitting: Physician Assistant

## 2018-12-16 ENCOUNTER — Other Ambulatory Visit: Payer: Self-pay

## 2018-12-16 ENCOUNTER — Encounter (HOSPITAL_COMMUNITY): Payer: Self-pay

## 2018-12-16 ENCOUNTER — Inpatient Hospital Stay (HOSPITAL_COMMUNITY)
Admission: EM | Admit: 2018-12-16 | Discharge: 2018-12-22 | DRG: 314 | Disposition: A | Discharge status: Home / Self Care | Payer: Medicare HMO | Attending: Internal Medicine | Admitting: Internal Medicine

## 2018-12-16 ENCOUNTER — Emergency Department (HOSPITAL_COMMUNITY): Payer: Medicare HMO

## 2018-12-16 DIAGNOSIS — Z79899 Other long term (current) drug therapy: Secondary | ICD-10-CM

## 2018-12-16 DIAGNOSIS — Z818 Family history of other mental and behavioral disorders: Secondary | ICD-10-CM

## 2018-12-16 DIAGNOSIS — G9341 Metabolic encephalopathy: Secondary | ICD-10-CM | POA: Diagnosis present

## 2018-12-16 DIAGNOSIS — I110 Hypertensive heart disease with heart failure: Secondary | ICD-10-CM | POA: Diagnosis present

## 2018-12-16 DIAGNOSIS — K2950 Unspecified chronic gastritis without bleeding: Secondary | ICD-10-CM | POA: Diagnosis present

## 2018-12-16 DIAGNOSIS — G8929 Other chronic pain: Secondary | ICD-10-CM | POA: Diagnosis present

## 2018-12-16 DIAGNOSIS — I248 Other forms of acute ischemic heart disease: Secondary | ICD-10-CM | POA: Diagnosis present

## 2018-12-16 DIAGNOSIS — E43 Unspecified severe protein-calorie malnutrition: Secondary | ICD-10-CM

## 2018-12-16 DIAGNOSIS — I513 Intracardiac thrombosis, not elsewhere classified: Secondary | ICD-10-CM | POA: Diagnosis present

## 2018-12-16 DIAGNOSIS — R778 Other specified abnormalities of plasma proteins: Secondary | ICD-10-CM

## 2018-12-16 DIAGNOSIS — K761 Chronic passive congestion of liver: Secondary | ICD-10-CM | POA: Diagnosis present

## 2018-12-16 DIAGNOSIS — G4700 Insomnia, unspecified: Secondary | ICD-10-CM | POA: Diagnosis present

## 2018-12-16 DIAGNOSIS — I420 Dilated cardiomyopathy: Principal | ICD-10-CM | POA: Diagnosis present

## 2018-12-16 DIAGNOSIS — Z833 Family history of diabetes mellitus: Secondary | ICD-10-CM

## 2018-12-16 DIAGNOSIS — R4702 Dysphasia: Secondary | ICD-10-CM | POA: Diagnosis present

## 2018-12-16 DIAGNOSIS — M170 Bilateral primary osteoarthritis of knee: Secondary | ICD-10-CM | POA: Diagnosis present

## 2018-12-16 DIAGNOSIS — E785 Hyperlipidemia, unspecified: Secondary | ICD-10-CM | POA: Diagnosis present

## 2018-12-16 DIAGNOSIS — Z8041 Family history of malignant neoplasm of ovary: Secondary | ICD-10-CM

## 2018-12-16 DIAGNOSIS — F419 Anxiety disorder, unspecified: Secondary | ICD-10-CM | POA: Diagnosis present

## 2018-12-16 DIAGNOSIS — E119 Type 2 diabetes mellitus without complications: Secondary | ICD-10-CM | POA: Diagnosis present

## 2018-12-16 DIAGNOSIS — I236 Thrombosis of atrium, auricular appendage, and ventricle as current complications following acute myocardial infarction: Secondary | ICD-10-CM | POA: Diagnosis present

## 2018-12-16 DIAGNOSIS — Z841 Family history of disorders of kidney and ureter: Secondary | ICD-10-CM

## 2018-12-16 DIAGNOSIS — R4182 Altered mental status, unspecified: Secondary | ICD-10-CM

## 2018-12-16 DIAGNOSIS — Z681 Body mass index (BMI) 19 or less, adult: Secondary | ICD-10-CM

## 2018-12-16 DIAGNOSIS — Z832 Family history of diseases of the blood and blood-forming organs and certain disorders involving the immune mechanism: Secondary | ICD-10-CM

## 2018-12-16 DIAGNOSIS — Z794 Long term (current) use of insulin: Secondary | ICD-10-CM

## 2018-12-16 DIAGNOSIS — D573 Sickle-cell trait: Secondary | ICD-10-CM | POA: Diagnosis present

## 2018-12-16 DIAGNOSIS — F329 Major depressive disorder, single episode, unspecified: Secondary | ICD-10-CM | POA: Diagnosis present

## 2018-12-16 DIAGNOSIS — E876 Hypokalemia: Secondary | ICD-10-CM | POA: Diagnosis present

## 2018-12-16 DIAGNOSIS — M109 Gout, unspecified: Secondary | ICD-10-CM | POA: Diagnosis present

## 2018-12-16 DIAGNOSIS — E1169 Type 2 diabetes mellitus with other specified complication: Secondary | ICD-10-CM | POA: Diagnosis present

## 2018-12-16 DIAGNOSIS — I272 Pulmonary hypertension, unspecified: Secondary | ICD-10-CM | POA: Diagnosis present

## 2018-12-16 DIAGNOSIS — R000 Tachycardia, unspecified: Secondary | ICD-10-CM | POA: Diagnosis present

## 2018-12-16 DIAGNOSIS — R402412 Glasgow coma scale score 13-15, at arrival to emergency department: Secondary | ICD-10-CM | POA: Diagnosis present

## 2018-12-16 DIAGNOSIS — I6350 Cerebral infarction due to unspecified occlusion or stenosis of unspecified cerebral artery: Secondary | ICD-10-CM | POA: Diagnosis present

## 2018-12-16 DIAGNOSIS — I493 Ventricular premature depolarization: Secondary | ICD-10-CM | POA: Diagnosis present

## 2018-12-16 DIAGNOSIS — Z800 Family history of malignant neoplasm of digestive organs: Secondary | ICD-10-CM

## 2018-12-16 DIAGNOSIS — Z7901 Long term (current) use of anticoagulants: Secondary | ICD-10-CM

## 2018-12-16 DIAGNOSIS — I69319 Unspecified symptoms and signs involving cognitive functions following cerebral infarction: Secondary | ICD-10-CM

## 2018-12-16 DIAGNOSIS — K219 Gastro-esophageal reflux disease without esophagitis: Secondary | ICD-10-CM | POA: Diagnosis present

## 2018-12-16 DIAGNOSIS — R112 Nausea with vomiting, unspecified: Secondary | ICD-10-CM | POA: Diagnosis present

## 2018-12-16 DIAGNOSIS — R109 Unspecified abdominal pain: Secondary | ICD-10-CM | POA: Diagnosis present

## 2018-12-16 DIAGNOSIS — R7989 Other specified abnormal findings of blood chemistry: Secondary | ICD-10-CM | POA: Diagnosis present

## 2018-12-16 DIAGNOSIS — Z8249 Family history of ischemic heart disease and other diseases of the circulatory system: Secondary | ICD-10-CM

## 2018-12-16 DIAGNOSIS — I10 Essential (primary) hypertension: Secondary | ICD-10-CM | POA: Diagnosis present

## 2018-12-16 DIAGNOSIS — E1159 Type 2 diabetes mellitus with other circulatory complications: Secondary | ICD-10-CM | POA: Diagnosis present

## 2018-12-16 DIAGNOSIS — I5042 Chronic combined systolic (congestive) and diastolic (congestive) heart failure: Secondary | ICD-10-CM | POA: Diagnosis present

## 2018-12-16 DIAGNOSIS — Z7982 Long term (current) use of aspirin: Secondary | ICD-10-CM

## 2018-12-16 DIAGNOSIS — K5900 Constipation, unspecified: Secondary | ICD-10-CM | POA: Diagnosis present

## 2018-12-16 LAB — COMPREHENSIVE METABOLIC PANEL
ALBUMIN: 3.7 g/dL (ref 3.5–5.0)
ALT: 25 U/L (ref 0–44)
ANION GAP: 11 (ref 5–15)
AST: 32 U/L (ref 15–41)
Alkaline Phosphatase: 48 U/L (ref 38–126)
BUN: 12 mg/dL (ref 8–23)
CO2: 27 mmol/L (ref 22–32)
Calcium: 8.6 mg/dL — ABNORMAL LOW (ref 8.9–10.3)
Chloride: 99 mmol/L (ref 98–111)
Creatinine, Ser: 0.68 mg/dL (ref 0.44–1.00)
GFR calc non Af Amer: >60 mL/min (ref >60)
GLUCOSE: 170 mg/dL — AB (ref 70–99)
Potassium: 3.3 mmol/L — ABNORMAL LOW (ref 3.5–5.1)
SODIUM: 137 mmol/L (ref 135–145)
Total Bilirubin: 1.7 mg/dL — ABNORMAL HIGH (ref 0.3–1.2)
Total Protein: 6.8 g/dL (ref 6.5–8.1)

## 2018-12-16 LAB — CBC
HCT: 44.1 % (ref 36.0–46.0)
Hemoglobin: 14 g/dL (ref 12.0–15.0)
MCH: 22 pg — ABNORMAL LOW (ref 26.0–34.0)
MCHC: 31.7 g/dL (ref 30.0–36.0)
MCV: 69.4 fL — ABNORMAL LOW (ref 80.0–100.0)
PLATELETS: 201 10*3/uL (ref 150–400)
RBC: 6.35 MIL/uL — ABNORMAL HIGH (ref 3.87–5.11)
RDW: 18.2 % — ABNORMAL HIGH (ref 11.5–15.5)
WBC: 10.3 10*3/uL (ref 4.0–10.5)
nRBC: 0 % (ref 0.0–0.2)

## 2018-12-16 LAB — URINALYSIS, ROUTINE W REFLEX MICROSCOPIC
Bilirubin Urine: NEGATIVE
GLUCOSE, UA: NEGATIVE mg/dL
Hgb urine dipstick: NEGATIVE
Ketones, ur: 5 mg/dL — AB
Nitrite: NEGATIVE
PH: 6 (ref 5.0–8.0)
Protein, ur: >=300 mg/dL — AB
Specific Gravity, Urine: 1.019 (ref 1.005–1.030)

## 2018-12-16 LAB — LIPASE, BLOOD: Lipase: 43 U/L (ref 11–51)

## 2018-12-16 MED ORDER — SODIUM CHLORIDE 0.9% FLUSH: 3.0000 mL | Freq: Once | INTRAVENOUS | Status: DC

## 2018-12-16 MED ORDER — SODIUM CHLORIDE 0.9 % IV BOLUS
250.0000 mL | Freq: Once | INTRAVENOUS | Status: AC
  Administered 2018-12-16: 250 mL via INTRAVENOUS

## 2018-12-16 NOTE — ED Provider Notes (Signed)
[place] DEPT Provider Note   CSN: [id number] Arrival date & time: 12/16/18  1913     History   Chief Complaint Chief Complaint  Patient presents with  . Abdominal Pain  . Emesis    HPI Kristen Lozano is a 63 y.o. female with a history of acute combined systolic and diastolic heart failure, left ventricular mural thrombus following MI on coumadin, diabetes mellitus type 2, sickle cell trait, CVA, HLD, HTN who presents to the emergency department with a chief complaint of nausea and vomiting.  Family reports the patient has been unable to keep down any of her medications for the last 2 days due to nausea and vomiting.  She has been seen numerous times in the ED for similar symptoms.  They state sometimes when the vomiting gets really bad it starts causing problems with her heart.  She denies chest pain, shortness of breath, palpitations, leg swelling, back pain, fever, chills, dysuria, vaginal bleeding or discharge at this time.  History is limited at this time as the patient's family are poor historians and the patient has altered mental status.  She is oriented to self only.  She believes the president is Kristen Lozano, the year is 2001, and the month is "20".  Family initially states that confusion is new, but later states that she is at her baseline and has been like this since her previous stroke.  Family states that she has been worked up and seen for this numerous times over a year and nobody can figure out what is going on.  They think that she may benefit from being tested for parasites or for arsenic poisoning.  Level 5 caveat secondary to altered mental status.  The history is provided by the patient. No language interpreter was used.    Past Medical History:  Diagnosis Date  . Acute combined systolic and diastolic HF (heart failure) ([place]) 10/16/2018  . Allergic rhinitis    Requires cetirizine, singulair, and fluticasone.  . Anxiety    Has been on  Xanax since 2009. Uses it for stress, anxiety, and insomnia. No contract yet.  . Arthritis   . CHF (congestive heart failure) (HCC)    EF 35% after stroke, presumed ischemic  . Chronic pain    Has OA of knees B. No Xrays in echart. Not requiring narcotics.  . Colitis 12/2017  . Depression   . Diabetes mellitus    Type 2, non insulin dependent. Was dx'd prior to 2008.  Marland Kitchen Hyperglycemia   . Hyperlipemia   . Hypertension   . Sickle cell trait ([place])   . Stroke [facility]) 09/05/14   Dominant left MCA infarcts secondary to unknown embolic source     Patient Active Problem List   Diagnosis Date Noted  . Acute metabolic encephalopathy 27/78/2423  . General weakness 10/25/2018  . Acute combined systolic and diastolic HF (heart failure) ([place]) 10/16/2018  . HLD (hyperlipidemia) 09/21/2018  . GERD (gastroesophageal reflux disease) 09/21/2018  . Nausea and vomiting 09/08/2018  . Nausea with vomiting 09/07/2018  . Dyspnea   . Hypomagnesemia   . Sinus tachycardia   . Malnutrition of moderate degree 08/13/2018  . Enteritis   . Pleural effusion   . Nausea 08/11/2018  . Dilated cardiomyopathy ([place])   . Nausea & vomiting 08/09/2018  . Type 2 diabetes mellitus with vascular disease ([place]) 08/09/2018  . Abdominal pain 08/09/2018  . Infectious colitis 01/11/2018  . Hypokalemia 01/11/2018  . Uncontrolled type 2 diabetes mellitus  with hyperglycemia ([place])   . Chronic combined systolic and diastolic CHF (congestive heart failure) ([place]) 12/16/2016  . Monitoring for long-term anticoagulant use 11/14/2016  . DKA (diabetic ketoacidoses) ([place]) 11/01/2016  . Diabetes mellitus 11/01/2016  . Elevated troponin   . Nonischemic cardiomyopathy ([place])   . LV (left ventricular) mural thrombus following MI ([place])   . Cognitive deficit due to recent stroke 10/18/2014  . History of completed stroke 10/18/2014  . Abnormal stress test 10/17/2014  . Dysphagia, pharyngoesophageal phase 09/23/2014  . Abnormal CT scan  09/23/2014  . Dilated bile duct 09/23/2014  . Urinary retention 09/07/2014  . 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 Stroke ([place]) 09/05/2014  . Cerebral infarction ([place]) 09/05/2014  . Non compliance w medication regimen 08/10/2012  . Fatigue 02/18/2012  . Essential hypertension 03/22/2011  . Healthcare maintenance 03/22/2011  . Chronic pain   . ALLERGIC RHINITIS, SEASONAL 03/14/2008  . SICKLE CELL TRAIT 10/31/2006  . Anxiety state 10/31/2006  . DEPRESSION 10/31/2006    Past Surgical History:  Procedure Laterality Date  . BIOPSY  09/23/2018   Procedure: BIOPSY;  Surgeon: Yetta Flock, MD;  Location: [facility] ENDOSCOPY;  Service: Gastroenterology;;  . ESOPHAGOGASTRODUODENOSCOPY (EGD) WITH PROPOFOL N/A 09/23/2018   Procedure: ESOPHAGOGASTRODUODENOSCOPY (EGD) WITH PROPOFOL;  Surgeon: Yetta Flock, MD;  Location: [facility] ENDOSCOPY;  Service: Gastroenterology;  Laterality: N/A;  . RIGHT/LEFT HEART CATH AND CORONARY ANGIOGRAPHY N/A 10/14/2018   Procedure: RIGHT/LEFT HEART CATH AND CORONARY ANGIOGRAPHY;  Surgeon: Troy Sine, MD;  Location: [facility] CV LAB;  Service: Cardiovascular;  Laterality: N/A;  . TEE WITHOUT CARDIOVERSION N/A 09/06/2014   Procedure: TRANSESOPHAGEAL ECHOCARDIOGRAM (TEE);  Surgeon: Pixie Casino, MD;  Location: [facility] ENDOSCOPY;  Service: Cardiovascular;  Laterality: N/A;     OB History   No obstetric history on file.      Home Medications    Prior to Admission medications   Medication Sig Start Date End Date Taking? Authorizing Provider  ASPIRIN LOW DOSE 81 MG EC tablet Take 1 tablet (81 mg total) by mouth daily. Patient taking differently: Take 81 mg by mouth daily.  01/22/18  Yes Hilty, Nadean Corwin, MD  atorvastatin (LIPITOR) 20 MG tablet Take 1 tablet (20 mg total) by mouth daily at 6 PM. Please schedule appointment for refills. 01/02/18  Yes Hilty, Nadean Corwin, MD  furosemide (LASIX) 40 MG tablet Take 1 tablet (40 mg total) by mouth daily.  10/17/18  Yes Isaiah Serge, NP  GLUCERNA (GLUCERNA) LIQD Take 237 mLs by mouth daily.   Yes [provider]  LANTUS SOLOSTAR 100 UNIT/ML Solostar Pen Inject 10 Units into the skin at bedtime.  12/09/18  Yes [provider]  losartan (COZAAR) 25 MG tablet Take 0.5 tablets (12.5 mg total) by mouth daily. 10/17/18  Yes Isaiah Serge, NP  metFORMIN (GLUCOPHAGE-XR) 500 MG 24 hr tablet Take 2 tablets (1,000 mg total) by mouth 2 (two) times daily. 10/17/18  Yes Isaiah Serge, NP  metoCLOPramide (REGLAN) 5 MG tablet Take 1 tablet (5 mg total) by mouth every 8 (eight) hours as needed for nausea. 11/24/18 11/24/19 Yes Dhungel, Nishant, MD  metoprolol succinate (TOPROL-XL) 100 MG 24 hr tablet Take 1 tablet (100 mg total) by mouth daily. Take with or immediately following a meal.  In the Morning Patient taking differently: Take 100 mg by mouth every morning. Take with or immediately following a meal.  In the Morning 10/17/18  Yes Isaiah Serge, NP  metoprolol succinate (TOPROL-XL)  50 MG 24 hr tablet Take 1 tablet (50 mg total) by mouth at bedtime. Take with or immediately following a meal. 10/16/18  Yes Isaiah Serge, NP  pantoprazole (PROTONIX) 40 MG tablet Take 1 tablet (40 mg total) by mouth daily with supper. 10/16/18  Yes Isaiah Serge, NP  potassium chloride (K-DUR) 10 MEQ tablet Take 1 tablet (10 mEq total) by mouth daily. 09/02/18  Yes Georgette Shell, MD  Acetaminophen 500 MG coapsule Take 500 mg by mouth daily as needed for mild pain or headache.     [provider]  docusate sodium (COLACE) 100 MG capsule Take 1 capsule (100 mg total) by mouth 2 (two) times daily as needed for mild constipation. Patient not taking: Reported on 12/09/2018 10/16/18   Isaiah Serge, NP  feeding supplement, ENSURE ENLIVE, (ENSURE ENLIVE) LIQD Take 237 mLs by mouth 3 (three) times daily between meals. Patient not taking: Reported on 12/09/2018 08/17/18   Oretha Milch D, MD    feeding supplement, ENSURE ENLIVE, (ENSURE ENLIVE) LIQD Take 237 mLs by mouth 3 (three) times daily between meals. Patient not taking: Reported on 12/09/2018 11/24/18   Dhungel, Flonnie Overman, MD  guaiFENesin (ROBITUSSIN) 100 MG/5ML SOLN Take 15 mLs (300 mg total) by mouth 4 (four) times daily. Patient taking differently: Take 15 mLs by mouth every 6 (six) hours as needed for cough.  10/16/18   Isaiah Serge, NP  insulin glargine (LANTUS) 100 UNIT/ML injection Inject 0.1 mLs (10 Units total) into the skin at bedtime. Patient not taking: Reported on 12/16/2018 09/25/18   Oswald Hillock, MD  ivabradine (CORLANOR) 5 MG TABS tablet Take 1 tablet (5 mg total) by mouth 2 (two) times daily with a meal. Patient not taking: Reported on 12/09/2018 10/16/18   Isaiah Serge, NP  ondansetron (ZOFRAN ODT) 8 MG disintegrating tablet Take 1 tablet (8 mg total) by mouth every 8 (eight) hours as needed for nausea or vomiting. 12/09/18   Lacretia Leigh, MD  oxyCODONE (OXY IR/ROXICODONE) 5 MG immediate release tablet Take 1 tablet (5 mg total) by mouth every 6 (six) hours as needed for moderate pain. Patient not taking: Reported on 12/09/2018 11/24/18   Dhungel, Flonnie Overman, MD  promethazine (PHENERGAN) 12.5 MG tablet Take 1 tablet (12.5 mg total) by mouth every 6 (six) hours as needed for nausea or vomiting. Patient not taking: Reported on 12/09/2018 11/24/18   Dhungel, Flonnie Overman, MD  sucralfate (CARAFATE) 1 GM/10ML suspension Take 10 mLs (1 g total) by mouth 4 (four) times daily -  with meals and at bedtime for 10 days. 10/19/18 12/09/18  Margette Fast, MD  warfarin (COUMADIN) 5 MG tablet Take 1 to 1.5 tablets by mouth daily as directed by coumadin clinic  Take 1.5 pills on Mondays and Fridays and 1 pill Tuesday, Wed. THursday, Saturday and Sunday Patient taking differently: Take 5-7.5 mg by mouth See admin instructions. Take 1.5 pills (7.5mg  totally) by mouth on Mondays and Fridays and take 1 tablet (5mg  totally) by mouth on  Tuesday, Wed. THursday, Saturday and Sunday 10/16/18   Isaiah Serge, NP    Family History Family History  Problem Relation Age of Onset  . Diabetes Mother   . Hypertension Mother   . Heart disease Father   . Heart attack Father   . Hypertension Father   . Diabetes Father   . Ovarian cancer Sister   . Liver cancer Sister   . Sickle cell anemia Daughter   . Schizophrenia  Daughter   . Hypertension Sister   . Hypertension Brother   . Hypertension Daughter   . Kidney disease Sister        x2  . Kidney disease Brother   . Stroke Neg Hx   . Esophageal cancer Neg Hx   . Colon cancer Neg Hx   . Colon polyps Neg Hx     Social History Social History   Tobacco Use  . Smoking status: Never Smoker  . Smokeless tobacco: Never Used  Substance Use Topics  . Alcohol use: No    Alcohol/week: 0.0 standard drinks  . Drug use: No     Allergies   Patient has no known allergies.   Review of Systems Review of Systems  Unable to perform ROS: Mental status change     Physical Exam Updated Vital Signs BP (!) 118/96 (BP Location: Right Arm)   Pulse 67   Temp 98.9 F (37.2 C) (Oral)   Resp 16   Ht 5\' 3"  (1.6 m)   Wt 46.7 kg   SpO2 96%   BMI 18.24 kg/m   Physical Exam Vitals signs and nursing note reviewed.  Constitutional:      General: She is not in acute distress.    Comments: Cachectic, chronically ill-appearing female  HENT:     Head: Normocephalic.     Comments: Poor dentition Eyes:     Conjunctiva/sclera: Conjunctivae normal.  Neck:     Musculoskeletal: Neck supple.  Cardiovascular:     Rate and Rhythm: Normal rate and regular rhythm.     Heart sounds: No murmur. No friction rub. No gallop.   Pulmonary:     Effort: Pulmonary effort is normal. No respiratory distress.  Abdominal:     General: There is no distension.     Palpations: Abdomen is soft.  Skin:    General: Skin is warm.     Findings: No rash.  Neurological:     Mental Status: She is alert.       Comments: Oriented to self only.  She believes the president is Kristen Lozano, year is 2001, and month is "20". She can correctly identify the first name of family members in the room.  She can correctly name an object, such as a pen.  GCS 15.  Moves all 4 extremities.  5-5 strength against resistance of the bilateral upper and lower extremities.  Neuro exam is limited as the patient only intermittently follows instructions.  Does not participate and finger-to-nose as she is unable to follow directions.  Gait deferred at this time.  Psychiatric:        Behavior: Behavior normal.    ED Treatments / Results  Labs (all labs ordered are listed, but only abnormal results are displayed) Labs Reviewed  COMPREHENSIVE METABOLIC PANEL - Abnormal; Notable for the following components:      Result Value   Potassium 3.3 (*)    Glucose, Bld 170 (*)    Calcium 8.6 (*)    Total Bilirubin 1.7 (*)    All other components within normal limits  CBC - Abnormal; Notable for the following components:   RBC 6.35 (*)    MCV 69.4 (*)    MCH 22.0 (*)    RDW 18.2 (*)    All other components within normal limits  URINALYSIS, ROUTINE W REFLEX MICROSCOPIC - Abnormal; Notable for the following components:   Ketones, ur 5 (*)    Protein, ur >=300 (*)    Leukocytes, UA TRACE (*)  Bacteria, UA RARE (*)    All other components within normal limits  BRAIN NATRIURETIC PEPTIDE - Abnormal; Notable for the following components:   B Natriuretic Peptide 1,200.8 (*)    All other components within normal limits  PROTIME-INR - Abnormal; Notable for the following components:   Prothrombin Time 18.3 (*)    All other components within normal limits  MAGNESIUM - Abnormal; Notable for the following components:   Magnesium 1.6 (*)    All other components within normal limits  TROPONIN I - Abnormal; Notable for the following components:   Troponin I 1.15 (*)    All other components within normal limits  HEMOGLOBIN A1C - Abnormal;  Notable for the following components:   Hgb A1c MFr Bld 8.6 (*)    All other components within normal limits  GLUCOSE, CAPILLARY - Abnormal; Notable for the following components:   Glucose-Capillary 205 (*)    All other components within normal limits  GLUCOSE, CAPILLARY - Abnormal; Notable for the following components:   Glucose-Capillary 181 (*)    All other components within normal limits  I-STAT TROPONIN, ED - Abnormal; Notable for the following components:   Troponin i, poc 0.54 (*)    All other components within normal limits  I-STAT TROPONIN, ED - Abnormal; Notable for the following components:   Troponin i, poc 0.74 (*)    All other components within normal limits  LIPASE, BLOOD  AMMONIA  APTT  LIPID PANEL  RAPID URINE DRUG SCREEN, HOSP PERFORMED  TROPONIN I  TROPONIN I  HEPARIN LEVEL (UNFRACTIONATED)  BASIC METABOLIC PANEL  HEPATIC FUNCTION PANEL  CBC WITH DIFFERENTIAL/PLATELET    EKG EKG Interpretation  Date/Time:  Thursday December 17 2018 01:08:52 EST Ventricular Rate:  125 PR Interval:    QRS Duration: 89 QT Interval:  340 QTC Calculation: 491 R Axis:   1 Text Interpretation:  Sinus tachycardia Atrial premature complexes Consider right atrial enlargement Left ventricular hypertrophy Nonspecific T abnormalities, lateral leads Borderline prolonged QT interval No acute changes Confirmed by Varney Biles ([id number]) on 12/17/2018 1:48:14 AM   Radiology Ct Head Wo Contrast  Result Date: 12/17/2018 CLINICAL DATA:  63 year old female with altered mental status. EXAM: CT HEAD WITHOUT CONTRAST TECHNIQUE: Contiguous axial images were obtained from the base of the skull through the vertex without intravenous contrast. COMPARISON:  Head CT dated 10/25/2018 and MRI dated 10/26/2018 FINDINGS: Brain: The ventricles and sulci appropriate size for patient's age. There is mild chronic microvascular ischemic changes. Bilateral MCA territory old infarcts as seen on the prior CT.  There is no acute intracranial hemorrhage. No mass effect or midline shift. No extra-axial fluid collection. Vascular: No hyperdense vessel or unexpected calcification. Skull: Normal. Negative for fracture or focal lesion. Sinuses/Orbits: No acute finding. Other: None IMPRESSION: 1. No acute intracranial hemorrhage. 2. 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 Electronically Signed   By: Anner Crete M.D.   On: 12/17/2018 00:23   Ct Venogram Head  Result Date: 12/17/2018 CLINICAL DATA:  Altered mental status. Assess for dural venous sinus thrombosis. History of infarcts, sickle cell trait. EXAM: CT VENOGRAM HEAD TECHNIQUE: CT HEAD December 17, 2018. CONTRAST:  11mL OMNIPAQUE IOHEXOL 300 MG/ML  SOLN COMPARISON:  CT HEAD December 17, 2018. FINDINGS: Normal contrast opacification of the superior sagittal sinus, torcula of the Herophili, bilateral transverse, sigmoid sinuses and included internal jugular veins. No suspicious filling defects or focal stenoses. Normal appearance of the internal cerebral veins. IMPRESSION: No dural  venous sinus thrombosis. Electronically Signed   By: Elon Alas M.D.   On: 12/17/2018 03:06    Procedures .Critical Care Performed by: Joanne Gavel, PA-C Authorized by: Joanne Gavel, PA-C   Critical care provider statement:    Critical care time (minutes):  40   Critical care time was exclusive of:  Separately billable procedures and treating other patients and teaching time   Critical care was necessary to treat or prevent imminent or life-threatening deterioration of the following conditions:  Cardiac failure   Critical care was time spent personally by me on the following activities:  Ordering and performing treatments and interventions, ordering and review of laboratory studies, ordering and review of radiographic studies, pulse oximetry, re-evaluation of patient's condition, review of old charts, development of  treatment plan with patient or surrogate, evaluation of patient's response to treatment, examination of patient and obtaining history from patient or surrogate   I assumed direction of critical care for this patient from another provider in my specialty: no     (including critical care time)  Medications Ordered in ED Medications  sodium chloride flush (NS) 0.9 % injection 3 mL (has no administration in time range)  aspirin EC tablet 81 mg (81 mg Oral Not Given 12/17/18 0902)  atorvastatin (LIPITOR) tablet 20 mg (has no administration in time range)  furosemide (LASIX) tablet 40 mg (40 mg Oral Not Given 12/17/18 0902)  losartan (COZAAR) tablet 12.5 mg (12.5 mg Oral Not Given 12/17/18 0902)  metoprolol succinate (TOPROL-XL) 24 hr tablet 100 mg (100 mg Oral Not Given 12/17/18 0901)  metoprolol succinate (TOPROL-XL) 24 hr tablet 50 mg (has no administration in time range)  insulin glargine (LANTUS) injection 5 Units (has no administration in time range)  metoCLOPramide (REGLAN) tablet 5 mg (has no administration in time range)  pantoprazole (PROTONIX) EC tablet 40 mg (has no administration in time range)  sucralfate (CARAFATE) 1 GM/10ML suspension 1 g (1 g Oral Not Given 12/17/18 0901)  feeding supplement (GLUCERNA SHAKE) (GLUCERNA SHAKE) liquid 237 mL (237 mLs Oral Not Given 12/17/18 0902)  guaiFENesin (ROBITUSSIN) 100 MG/5ML solution 300 mg (has no administration in time range)  ondansetron (ZOFRAN) injection 4 mg (has no administration in time range)  nitroGLYCERIN (NITROSTAT) SL tablet 0.4 mg (has no administration in time range)  morphine 2 MG/ML injection 2 mg (2 mg Intravenous Given 12/17/18 0521)  insulin aspart (novoLOG) injection 0-9 Units (2 Units Subcutaneous Given 12/17/18 0900)  acetaminophen (TYLENOL) tablet 650 mg (has no administration in time range)  zolpidem (AMBIEN) tablet 5 mg (has no administration in time range)  heparin ADULT infusion 100 units/mL (25000 units/219mL sodium  chloride 0.45%) (800 Units/hr Intravenous New Bag/Given 12/17/18 0526)  warfarin (COUMADIN) tablet 7.5 mg (7.5 mg Oral Not Given 12/17/18 0901)  Warfarin - Pharmacist Dosing Inpatient (has no administration in time range)  hydrALAZINE (APRESOLINE) injection 5 mg (has no administration in time range)  magnesium sulfate IVPB 4 g 100 mL (has no administration in time range)  sodium chloride 0.9 % bolus 250 mL (0 mLs Intravenous Stopped 12/17/18 0030)  ondansetron (ZOFRAN) injection 4 mg (4 mg Intravenous Given 12/17/18 0122)  aspirin chewable tablet 324 mg (324 mg Oral Given 12/17/18 0228)  iohexol (OMNIPAQUE) 300 MG/ML solution 75 mL (75 mLs Intravenous Contrast Given 12/17/18 0247)  potassium chloride 20 MEQ/15ML (10%) solution 40 mEq (40 mEq Oral Given 12/17/18 0527)  magnesium sulfate IVPB 2 g 50 mL (0 g Intravenous Stopping Infusion hung by  another clincian 12/17/18 0901)  furosemide (LASIX) injection 40 mg (40 mg Intravenous Given 12/17/18 0713)     Initial Impression / Assessment and Plan / ED Course  I have reviewed the triage vital signs and the nursing notes.  Pertinent labs & imaging results that were available during my care of the patient were reviewed by me and considered in my medical decision making (see chart for details).     63 year old female with a history of acute combined systolic and diastolic heart failure, left ventricular mural thrombus following MI on coumadin, diabetes mellitus type 2, sickle cell trait, CVA, HLD, HTN who is accompanied by family to the emergency department for nausea and vomiting.  From what I can gather, family is concerned about the patient's chronic vomiting and abdominal pain.  They are requesting testing for parasites and arsenic poisoning?  On my abdominal exam, the patient is nontender throughout the abdomen with light and deep palpation.  She is confused and intermittently follows commands.  There is conflicting information from family to if this is  her baseline or if this is an acute change.  Based on review of the patient's medical record, over the last few visits, she has been described as alert and oriented x4.  Will order CT head.  Since the patient has also been taking frequent antiemetics at home, will order troponin, EKG, and other screening labs.  Patient was noted to have an EF of 20 to 25% on previous right heart cath performed and November 2019.  Labs are notable for mild hypokalemia of 3.3.  UA with greater than 300 proteinuria, but not concerning for infection.  INR is 1.54.  Ammonia is normal.  BNP is 1200.  X-ray from 1 week ago reviewed with mild cardiomegaly with bilateral interstitial thickening and mild interstitial pulmonary edema.  CT head is negative.  Patient was discussed with Dr. Georgia Lopes, attending physician who recommends CT venogram, which is unremarkable.  Initial troponin is 0.54, with uptrending troponin of 0.74.  EKG with sinus tachycardia in the 120s and borderline prolonged QTC, but otherwise no acute changes.  Patient was given 324 mg of ASA in the ED. caution NSTEMI versus demand ischemia.  Dr. Kathrynn Humble reports that during his independent evaluation that the patient was noted to be retching in the room and Zofran was ordered. Consulted the hospitalist team and spoke with Dr. Blaine Hamper who will admit the patient for further work-up and evaluation for elevated troponin. The patient appears reasonably stabilized for admission considering the current resources, flow, and capabilities available in the ED at this time, and I doubt any other [facility] requiring further screening and/or treatment in the ED prior to admission.  Final Clinical Impressions(s) / ED Diagnoses   Final diagnoses:  Elevated troponin  Altered mental status, unspecified altered mental status type    ED Discharge Orders    None       Cayman Brogden A, PA-C 12/17/18 [id number]    Varney Biles, MD 12/18/18 [id number]

## 2018-12-16 NOTE — ED Triage Notes (Signed)
Pt reports N/V and abdominal pain since yesterday. Pts husband reports that they have been back and forth to the GI and ED for same thing and they cannot figure out what is wrong with the pt. Pts husband is insisting that they do a "parasite test".

## 2018-12-17 ENCOUNTER — Emergency Department (HOSPITAL_COMMUNITY): Payer: Medicare HMO

## 2018-12-17 ENCOUNTER — Encounter (HOSPITAL_COMMUNITY): Payer: Self-pay

## 2018-12-17 ENCOUNTER — Observation Stay (HOSPITAL_BASED_OUTPATIENT_CLINIC_OR_DEPARTMENT_OTHER): Payer: Medicare HMO

## 2018-12-17 DIAGNOSIS — G9341 Metabolic encephalopathy: Secondary | ICD-10-CM | POA: Diagnosis present

## 2018-12-17 DIAGNOSIS — I639 Cerebral infarction, unspecified: Secondary | ICD-10-CM

## 2018-12-17 DIAGNOSIS — I351 Nonrheumatic aortic (valve) insufficiency: Secondary | ICD-10-CM

## 2018-12-17 DIAGNOSIS — R1084 Generalized abdominal pain: Secondary | ICD-10-CM | POA: Diagnosis not present

## 2018-12-17 DIAGNOSIS — I361 Nonrheumatic tricuspid (valve) insufficiency: Secondary | ICD-10-CM

## 2018-12-17 DIAGNOSIS — R7989 Other specified abnormal findings of blood chemistry: Secondary | ICD-10-CM

## 2018-12-17 DIAGNOSIS — I5042 Chronic combined systolic (congestive) and diastolic (congestive) heart failure: Secondary | ICD-10-CM | POA: Diagnosis not present

## 2018-12-17 DIAGNOSIS — I236 Thrombosis of atrium, auricular appendage, and ventricle as current complications following acute myocardial infarction: Secondary | ICD-10-CM

## 2018-12-17 DIAGNOSIS — R112 Nausea with vomiting, unspecified: Secondary | ICD-10-CM

## 2018-12-17 DIAGNOSIS — E1159 Type 2 diabetes mellitus with other circulatory complications: Secondary | ICD-10-CM

## 2018-12-17 DIAGNOSIS — I10 Essential (primary) hypertension: Secondary | ICD-10-CM | POA: Diagnosis not present

## 2018-12-17 DIAGNOSIS — E785 Hyperlipidemia, unspecified: Secondary | ICD-10-CM

## 2018-12-17 DIAGNOSIS — E876 Hypokalemia: Secondary | ICD-10-CM

## 2018-12-17 LAB — HEPARIN LEVEL (UNFRACTIONATED)
Heparin Unfractionated: 0.46 IU/mL (ref 0.30–0.70)
Heparin Unfractionated: 0.58 IU/mL (ref 0.30–0.70)

## 2018-12-17 LAB — I-STAT TROPONIN, ED
TROPONIN I, POC: 0.54 ng/mL — AB (ref 0.00–0.08)
Troponin i, poc: 0.74 ng/mL (ref 0.00–0.08)

## 2018-12-17 LAB — CBC WITH DIFFERENTIAL/PLATELET
ABS IMMATURE GRANULOCYTES: 0.03 10*3/uL (ref 0.00–0.07)
BASOS PCT: 1 %
Basophils Absolute: 0 10*3/uL (ref 0.0–0.1)
EOS ABS: 0.1 10*3/uL (ref 0.0–0.5)
Eosinophils Relative: 1 %
HCT: 50.4 % — ABNORMAL HIGH (ref 36.0–46.0)
Hemoglobin: 15.5 g/dL — ABNORMAL HIGH (ref 12.0–15.0)
Immature Granulocytes: 0 %
Lymphocytes Relative: 13 %
Lymphs Abs: 1.1 10*3/uL (ref 0.7–4.0)
MCH: 21.3 pg — ABNORMAL LOW (ref 26.0–34.0)
MCHC: 30.8 g/dL (ref 30.0–36.0)
MCV: 69.2 fL — ABNORMAL LOW (ref 80.0–100.0)
Monocytes Absolute: 0.5 10*3/uL (ref 0.1–1.0)
Monocytes Relative: 6 %
NEUTROS ABS: 6.7 10*3/uL (ref 1.7–7.7)
NEUTROS PCT: 79 %
Platelets: 203 10*3/uL (ref 150–400)
RBC: 7.28 MIL/uL — ABNORMAL HIGH (ref 3.87–5.11)
RDW: 19.3 % — ABNORMAL HIGH (ref 11.5–15.5)
WBC: 8.5 10*3/uL (ref 4.0–10.5)
nRBC: 0 % (ref 0.0–0.2)

## 2018-12-17 LAB — HEMOGLOBIN A1C
Hgb A1c MFr Bld: 8.6 % — ABNORMAL HIGH (ref 4.8–5.6)
Mean Plasma Glucose: 200.12 mg/dL

## 2018-12-17 LAB — LIPID PANEL
CHOL/HDL RATIO: 1.9 ratio
Cholesterol: 89 mg/dL (ref 0–200)
HDL: 47 mg/dL (ref >40)
LDL Cholesterol: 33 mg/dL (ref 0–99)
TRIGLYCERIDES: 43 mg/dL (ref <150)
VLDL: 9 mg/dL (ref 0–40)

## 2018-12-17 LAB — GLUCOSE, CAPILLARY
Glucose-Capillary: 205 mg/dL — ABNORMAL HIGH (ref 70–99)
Glucose-Capillary: 181 mg/dL — ABNORMAL HIGH (ref 70–99)
Glucose-Capillary: 145 mg/dL — ABNORMAL HIGH (ref 70–99)
Glucose-Capillary: 181 mg/dL — ABNORMAL HIGH (ref 70–99)
Glucose-Capillary: 248 mg/dL — ABNORMAL HIGH (ref 70–99)

## 2018-12-17 LAB — BASIC METABOLIC PANEL
Anion gap: 12 (ref 5–15)
BUN: 9 mg/dL (ref 8–23)
CO2: 28 mmol/L (ref 22–32)
Calcium: 8.8 mg/dL — ABNORMAL LOW (ref 8.9–10.3)
Chloride: 97 mmol/L — ABNORMAL LOW (ref 98–111)
Creatinine, Ser: 0.72 mg/dL (ref 0.44–1.00)
GFR calc Af Amer: >60 mL/min (ref >60)
GFR calc non Af Amer: >60 mL/min (ref >60)
GLUCOSE: 168 mg/dL — AB (ref 70–99)
Potassium: 3.3 mmol/L — ABNORMAL LOW (ref 3.5–5.1)
Sodium: 137 mmol/L (ref 135–145)

## 2018-12-17 LAB — HEPATIC FUNCTION PANEL
ALT: 24 U/L (ref 0–44)
AST: 38 U/L (ref 15–41)
Albumin: 3.9 g/dL (ref 3.5–5.0)
Alkaline Phosphatase: 50 U/L (ref 38–126)
BILIRUBIN DIRECT: 0.4 mg/dL — AB (ref 0.0–0.2)
Indirect Bilirubin: 1.4 mg/dL — ABNORMAL HIGH (ref 0.3–0.9)
Total Bilirubin: 1.8 mg/dL — ABNORMAL HIGH (ref 0.3–1.2)
Total Protein: 7.4 g/dL (ref 6.5–8.1)

## 2018-12-17 LAB — TROPONIN I
Troponin I: 1.15 ng/mL (ref <0.03)
Troponin I: 1.3 ng/mL (ref <0.03)
Troponin I: 1.27 ng/mL (ref <0.03)

## 2018-12-17 LAB — PROTIME-INR
INR: 1.54
Prothrombin Time: 18.3 seconds — ABNORMAL HIGH (ref 11.4–15.2)

## 2018-12-17 LAB — ECHOCARDIOGRAM COMPLETE
Height: 63 in
Weight: 1647.28 oz

## 2018-12-17 LAB — BRAIN NATRIURETIC PEPTIDE: B Natriuretic Peptide: 1200.8 pg/mL — ABNORMAL HIGH (ref 0.0–100.0)

## 2018-12-17 LAB — AMMONIA: Ammonia: 13 umol/L (ref 9–35)

## 2018-12-17 LAB — MAGNESIUM: Magnesium: 1.6 mg/dL — ABNORMAL LOW (ref 1.7–2.4)

## 2018-12-17 LAB — APTT: aPTT: 28 seconds (ref 24–36)

## 2018-12-17 MED ORDER — FUROSEMIDE 40 MG PO TABS
40.0000 mg | ORAL_TABLET | Freq: Every day | ORAL | Status: DC
  Administered 2018-12-18: 40 mg via ORAL
  Filled 2018-12-17 (×2): qty 1

## 2018-12-17 MED ORDER — SUCRALFATE 1 GM/10ML PO SUSP
1.0000 g | Freq: Three times a day (TID) | ORAL | Status: DC
  Administered 2018-12-17 – 2018-12-22 (×18): 1 g via ORAL
  Filled 2018-12-17 (×19): qty 10

## 2018-12-17 MED ORDER — POTASSIUM CHLORIDE 10 MEQ/100ML IV SOLN
10.0000 meq | INTRAVENOUS | Status: AC
  Administered 2018-12-17 (×4): 10 meq via INTRAVENOUS
  Filled 2018-12-17 (×4): qty 100

## 2018-12-17 MED ORDER — PANTOPRAZOLE SODIUM 40 MG IV SOLR
40.0000 mg | INTRAVENOUS | Status: DC
  Administered 2018-12-17 – 2018-12-19 (×3): 40 mg via INTRAVENOUS
  Filled 2018-12-17 (×3): qty 40

## 2018-12-17 MED ORDER — SODIUM CHLORIDE (PF) 0.9 % IJ SOLN
INTRAMUSCULAR | Status: AC
  Filled 2018-12-17: qty 50

## 2018-12-17 MED ORDER — FUROSEMIDE 10 MG/ML IJ SOLN
40.0000 mg | Freq: Once | INTRAMUSCULAR | Status: AC
  Administered 2018-12-17: 40 mg via INTRAVENOUS
  Filled 2018-12-17: qty 4

## 2018-12-17 MED ORDER — WARFARIN SODIUM 5 MG PO TABS
7.5000 mg | ORAL_TABLET | Freq: Once | ORAL | Status: DC
  Filled 2018-12-17 (×2): qty 1

## 2018-12-17 MED ORDER — ONDANSETRON HCL 4 MG/2ML IJ SOLN
4.0000 mg | Freq: Three times a day (TID) | INTRAMUSCULAR | Status: DC | PRN
  Administered 2018-12-17 – 2018-12-22 (×4): 4 mg via INTRAVENOUS
  Filled 2018-12-17 (×5): qty 2

## 2018-12-17 MED ORDER — GLUCERNA SHAKE PO LIQD
237.0000 mL | Freq: Every day | ORAL | Status: DC
  Administered 2018-12-19 – 2018-12-22 (×4): 237 mL via ORAL
  Filled 2018-12-17 (×6): qty 237

## 2018-12-17 MED ORDER — HYDRALAZINE HCL 20 MG/ML IJ SOLN: 5.0000 mg | INTRAMUSCULAR | Status: DC | PRN

## 2018-12-17 MED ORDER — NITROGLYCERIN 0.4 MG SL SUBL: 0.4000 mg | SUBLINGUAL_TABLET | SUBLINGUAL | Status: DC | PRN

## 2018-12-17 MED ORDER — WARFARIN - PHARMACIST DOSING INPATIENT
Freq: Every day | Status: DC
  Administered 2018-12-17: 18:00:00

## 2018-12-17 MED ORDER — METOPROLOL SUCCINATE ER 50 MG PO TB24: 50.0000 mg | ORAL_TABLET | Freq: Every day | ORAL | Status: DC

## 2018-12-17 MED ORDER — HEPARIN (PORCINE) 25000 UT/250ML-% IV SOLN
800.0000 [IU]/h | INTRAVENOUS | Status: DC
  Administered 2018-12-17 – 2018-12-18 (×2): 800 [IU]/h via INTRAVENOUS
  Filled 2018-12-17 (×2): qty 250

## 2018-12-17 MED ORDER — INSULIN GLARGINE 100 UNIT/ML ~~LOC~~ SOLN
5.0000 [IU] | Freq: Every day | SUBCUTANEOUS | Status: DC
  Administered 2018-12-17 – 2018-12-21 (×5): 5 [IU] via SUBCUTANEOUS
  Filled 2018-12-17 (×5): qty 0.05

## 2018-12-17 MED ORDER — INSULIN ASPART 100 UNIT/ML ~~LOC~~ SOLN
0.0000 [IU] | Freq: Three times a day (TID) | SUBCUTANEOUS | Status: DC
  Administered 2018-12-17: 2 [IU] via SUBCUTANEOUS
  Administered 2018-12-17: 1 [IU] via SUBCUTANEOUS
  Administered 2018-12-17 – 2018-12-18 (×2): 2 [IU] via SUBCUTANEOUS
  Administered 2018-12-18: 1 [IU] via SUBCUTANEOUS
  Administered 2018-12-19: 3 [IU] via SUBCUTANEOUS
  Administered 2018-12-19 – 2018-12-20 (×4): 2 [IU] via SUBCUTANEOUS
  Administered 2018-12-21: 3 [IU] via SUBCUTANEOUS
  Administered 2018-12-21: 2 [IU] via SUBCUTANEOUS
  Administered 2018-12-21: 3 [IU] via SUBCUTANEOUS
  Administered 2018-12-22: 2 [IU] via SUBCUTANEOUS
  Administered 2018-12-22: 1 [IU] via SUBCUTANEOUS
  Administered 2018-12-22: 3 [IU] via SUBCUTANEOUS

## 2018-12-17 MED ORDER — POTASSIUM CHLORIDE 20 MEQ/15ML (10%) PO SOLN
40.0000 meq | Freq: Once | ORAL | Status: AC
  Administered 2018-12-17: 40 meq via ORAL
  Filled 2018-12-17: qty 30

## 2018-12-17 MED ORDER — ASPIRIN EC 325 MG PO TBEC
325.0000 mg | DELAYED_RELEASE_TABLET | Freq: Once | ORAL | Status: DC
  Filled 2018-12-17: qty 1

## 2018-12-17 MED ORDER — ONDANSETRON HCL 4 MG/2ML IJ SOLN
4.0000 mg | Freq: Once | INTRAMUSCULAR | Status: AC
  Administered 2018-12-17: 4 mg via INTRAVENOUS
  Filled 2018-12-17: qty 2

## 2018-12-17 MED ORDER — MORPHINE SULFATE (PF) 2 MG/ML IV SOLN
2.0000 mg | INTRAVENOUS | Status: DC | PRN
  Administered 2018-12-17 – 2018-12-21 (×2): 2 mg via INTRAVENOUS
  Filled 2018-12-17 (×2): qty 1

## 2018-12-17 MED ORDER — GUAIFENESIN 100 MG/5ML PO SOLN: 15.0000 mL | Freq: Four times a day (QID) | ORAL | Status: DC | PRN

## 2018-12-17 MED ORDER — ATORVASTATIN CALCIUM 20 MG PO TABS
20.0000 mg | ORAL_TABLET | Freq: Every day | ORAL | Status: DC
  Administered 2018-12-17 – 2018-12-22 (×5): 20 mg via ORAL
  Filled 2018-12-17 (×6): qty 1

## 2018-12-17 MED ORDER — WARFARIN SODIUM 5 MG PO TABS
7.5000 mg | ORAL_TABLET | Freq: Once | ORAL | Status: AC
  Administered 2018-12-17: 7.5 mg via ORAL

## 2018-12-17 MED ORDER — ZOLPIDEM TARTRATE 5 MG PO TABS: 5.0000 mg | ORAL_TABLET | Freq: Every evening | ORAL | Status: DC | PRN

## 2018-12-17 MED ORDER — ASPIRIN EC 81 MG PO TBEC
81.0000 mg | DELAYED_RELEASE_TABLET | Freq: Every day | ORAL | Status: DC
  Administered 2018-12-18 – 2018-12-22 (×5): 81 mg via ORAL
  Filled 2018-12-17 (×6): qty 1

## 2018-12-17 MED ORDER — IOHEXOL 300 MG/ML  SOLN
75.0000 mL | Freq: Once | INTRAMUSCULAR | Status: AC | PRN
  Administered 2018-12-17: 75 mL via INTRAVENOUS

## 2018-12-17 MED ORDER — PANTOPRAZOLE SODIUM 40 MG PO TBEC: 40.0000 mg | DELAYED_RELEASE_TABLET | Freq: Every day | ORAL | Status: DC

## 2018-12-17 MED ORDER — METOCLOPRAMIDE HCL 10 MG PO TABS
5.0000 mg | ORAL_TABLET | Freq: Three times a day (TID) | ORAL | Status: DC | PRN
  Filled 2018-12-17: qty 1

## 2018-12-17 MED ORDER — METOPROLOL TARTRATE 5 MG/5ML IV SOLN
5.0000 mg | Freq: Four times a day (QID) | INTRAVENOUS | Status: DC
  Administered 2018-12-17 – 2018-12-18 (×4): 5 mg via INTRAVENOUS
  Filled 2018-12-17 (×4): qty 5

## 2018-12-17 MED ORDER — METOPROLOL SUCCINATE ER 100 MG PO TB24
100.0000 mg | ORAL_TABLET | Freq: Every day | ORAL | Status: DC
  Filled 2018-12-17: qty 1

## 2018-12-17 MED ORDER — LOSARTAN POTASSIUM 25 MG PO TABS
12.5000 mg | ORAL_TABLET | Freq: Every day | ORAL | Status: DC
  Administered 2018-12-18 – 2018-12-22 (×5): 12.5 mg via ORAL
  Filled 2018-12-17 (×6): qty 1

## 2018-12-17 MED ORDER — MAGNESIUM SULFATE 2 GM/50ML IV SOLN
2.0000 g | Freq: Once | INTRAVENOUS | Status: AC
  Administered 2018-12-17: 2 g via INTRAVENOUS
  Filled 2018-12-17: qty 50

## 2018-12-17 MED ORDER — ACETAMINOPHEN 325 MG PO TABS: 650.0000 mg | ORAL_TABLET | ORAL | Status: DC | PRN

## 2018-12-17 MED ORDER — MAGNESIUM SULFATE 4 GM/100ML IV SOLN
4.0000 g | Freq: Once | INTRAVENOUS | Status: AC
  Administered 2018-12-17: 4 g via INTRAVENOUS
  Filled 2018-12-17: qty 100

## 2018-12-17 MED ORDER — ASPIRIN 81 MG PO CHEW
324.0000 mg | CHEWABLE_TABLET | Freq: Once | ORAL | Status: AC
  Administered 2018-12-17: 324 mg via ORAL
  Filled 2018-12-17: qty 4

## 2018-12-17 NOTE — Progress Notes (Signed)
Pt would not let lab draw troponin level. After calling husband and getting him to come to room we were able to get lab to come draw troponin and went ahead and drew the noon heparin level on the case that the patient will most likely refuse to get labs drawn again. So troponin level and heparin level were drawn early.  Timoteo Gaul, RN

## 2018-12-17 NOTE — ED Notes (Signed)
MD Blaine Hamper at beside

## 2018-12-17 NOTE — Progress Notes (Signed)
ANTICOAGULATION CONSULT NOTE - Follow Up  Pharmacy Consult for IV heparin/Warfarin Indication: LV thrombus  No Known Allergies  Patient Measurements: Height: 5\' 3"  (160 cm) Weight: 102 lb 15.3 oz (46.7 kg) IBW/kg (Calculated) : 52.4 Heparin Dosing Weight: actual  Vital Signs: BP: 90/65 (01/23 1452) Pulse Rate: 104 (01/23 1452)  Labs: Recent Labs    12/16/18 2022 12/17/18 0030 12/17/18 0521 12/17/18 0955 12/17/18 1534  HGB 14.0  --   --  15.5*  --   HCT 44.1  --   --  50.4*  --   PLT 201  --   --  203  --   APTT  --  28  --   --   --   LABPROT  --  18.3*  --   --   --   INR  --  1.54  --   --   --   HEPARINUNFRC  --   --   --  0.46 0.58  CREATININE 0.68  --   --  0.72  --   TROPONINI  --   --  1.15* 1.30* 1.27*    Estimated Creatinine Clearance: 53.1 mL/min (by C-G formula based on SCr of 0.72 mg/dL).   Medical History: Past Medical History:  Diagnosis Date  . Acute combined systolic and diastolic HF (heart failure) ([place]) 10/16/2018  . Allergic rhinitis    Requires cetirizine, singulair, and fluticasone.  . Anxiety    Has been on Xanax since 2009. Uses it for stress, anxiety, and insomnia. No contract yet.  . Arthritis   . CHF (congestive heart failure) ([facility])    EF 35% after stroke, presumed ischemic  . Chronic pain    Has OA of knees B. No Xrays in echart. Not requiring narcotics.  . Colitis 12/2017  . Depression   . Diabetes mellitus    Type 2, non insulin dependent. Was dx'd prior to 2008.  Marland Kitchen Hyperglycemia   . Hyperlipemia   . Hypertension   . Sickle cell trait ([place])   . 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 aspirin EC  81 mg Oral Daily  . atorvastatin  20 mg Oral q1800  . feeding supplement (GLUCERNA SHAKE)  237 mL Oral Daily  . furosemide  40 mg Oral Daily  . insulin aspart  0-9 Units Subcutaneous TID WC  . insulin glargine  5 Units Subcutaneous QHS  . losartan   12.5 mg Oral Daily  . metoprolol tartrate  5 mg Intravenous Q6H  . pantoprazole (PROTONIX) IV  40 mg Intravenous Q24H  . sodium chloride flush  3 mL Intravenous Once  . sucralfate  1 g Oral TID WC & HS  . warfarin  7.5 mg Oral ONCE-1800  . Warfarin - Pharmacist Dosing Inpatient   Does not apply q1800   Infusions:  . 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 INR= 1.54.  IV heparin and warfarin per Rx. Baseline labs: H/H = 14/44.1, Plts = 201, aPtt =28  Today. 12/17/18  First heparin level drawn too early at 0955 instead of ordered 1200; however, it was therapeutic on current IV heparin rate of 800 units/hr so will recheck level later so patient not restuck at 1200  Hgb elevated  No reported bleeding  INR subtherapeutic and warfarin was ordered but not given this  AM (charted per RN that family refused) so will reorder to be given this PM at 1800  Goal of Therapy:  INR 2-3  HL = 0.3-0.7 Monitor platelets by anticoagulation protocol: Yes   Plan:  1) Continue IV heparin drip at 800 units/hr 2) Recheck heparin level today at 1600 to confirm continued goal level at current IV heparin rate 3) Reordered 7.5 mg Warfarin x1 to be given at 1800 after 1st dose not given this AM 4) Daily PT/INR, heparin level and CBC  PM update - Confirmatory level is 0.58, therapeutic. Continue current rate. No bleeding or line issues per RN   Royetta Asal, PharmD, BCPS Pager [phone] 12/17/2018 5:15 PM

## 2018-12-17 NOTE — Progress Notes (Signed)
ANTICOAGULATION CONSULT NOTE - Follow Up  Pharmacy Consult for IV heparin/Warfarin Indication: LV thrombus  No Known Allergies  Patient Measurements: Height: 5\' 3"  (160 cm) Weight: 102 lb 15.3 oz (46.7 kg) IBW/kg (Calculated) : 52.4 Heparin Dosing Weight: actual  Vital Signs: Temp: 98.9 F (37.2 C) (01/23 0503) Temp Source: Oral (01/23 0503) BP: 118/96 (01/23 0503) Pulse Rate: 67 (01/23 0503)  Labs: Recent Labs    12/16/18 2022 12/17/18 0030 12/17/18 0521 12/17/18 0955  HGB 14.0  --   --  15.5*  HCT 44.1  --   --  50.4*  PLT 201  --   --  203  APTT  --  28  --   --   LABPROT  --  18.3*  --   --   INR  --  1.54  --   --   HEPARINUNFRC  --   --   --  0.46  CREATININE 0.68  --   --  0.72  TROPONINI  --   --  1.15* 1.30*    Estimated Creatinine Clearance: 53.1 mL/min (by C-G formula based on SCr of 0.72 mg/dL).   Medical History: Past Medical History:  Diagnosis Date  . Acute combined systolic and diastolic HF (heart failure) ([place]) 10/16/2018  . Allergic rhinitis    Requires cetirizine, singulair, and fluticasone.  . Anxiety    Has been on Xanax since 2009. Uses it for stress, anxiety, and insomnia. No contract yet.  . Arthritis   . CHF (congestive heart failure) (H[facility])    EF 35% after stroke, presumed ischemic  . Chronic pain    Has OA of knees B. No Xrays in echart. Not requiring narcotics.  . Colitis 12/2017  . Depression   . Diabetes mellitus    Type 2, non insulin dependent. Was dx'd prior to 2008.  Marland Kitchen Hyperglycemia   . Hyperlipemia   . Hypertension   . Sickle cell trait ([place])   . 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 aspirin EC  81 mg Oral Daily  . atorvastatin  20 mg Oral q1800  . feeding supplement (GLUCERNA SHAKE)  237 mL Oral Daily  . furosemide  40 mg Oral Daily  . insulin aspart  0-9 Units Subcutaneous TID WC  . insulin glargine  5 Units Subcutaneous QHS  .  losartan  12.5 mg Oral Daily  . metoprolol tartrate  5 mg Intravenous Q6H  . pantoprazole (PROTONIX) IV  40 mg Intravenous Q24H  . sodium chloride flush  3 mL Intravenous Once  . sucralfate  1 g Oral TID WC & HS  . warfarin  7.5 mg Oral Once  . Warfarin - Pharmacist Dosing Inpatient   Does not apply q1800   Infusions:  . heparin 800 Units/hr (12/17/18 0526)  . magnesium sulfate 1 - 4 g bolus IVPB 4 g (12/17/18 1143)  . 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 INR= 1.54.  IV heparin and warfarin per Rx. Baseline labs: H/H = 14/44.1, Plts = 201, aPtt =28  Today. 12/17/18  First heparin level drawn too early at 0955 instead of ordered 1200; however, it was therapeutic on current IV heparin rate of 800 units/hr so will recheck level later so patient not restuck at 1200  Hgb elevated  No reported bleeding  INR subtherapeutic and  warfarin was ordered but not given this AM (charted per RN that family refused) so will reorder to be given this PM at 1800  Goal of Therapy:  INR 2-3  HL = 0.3-0.7 Monitor platelets by anticoagulation protocol: Yes   Plan:  1) Continue IV heparin drip at 800 units/hr 2) Recheck heparin level today at 1600 to confirm continued goal level at current IV heparin rate 3) Reordered 7.5 mg Warfarin x1 to be given at 1800 after 1st dose not given this AM 4) Daily PT/INR, heparin level and CBC  Kara Mead 12/17/2018,11:53 AM

## 2018-12-17 NOTE — ED Notes (Signed)
Niu MD made aware of patient's troponin

## 2018-12-17 NOTE — Progress Notes (Signed)
CRITICAL VALUE ALERT  Critical Value: Troponin 1.15  Date & Time Notied:  12/17/2018 4373  Provider Notified: Baltazar Najjar

## 2018-12-17 NOTE — Progress Notes (Signed)
Initial Nutrition Assessment  DOCUMENTATION CODES:   Underweight, Severe malnutrition in context of chronic illness  INTERVENTION:    Monitor for diet advancement/toleration  Glucerna Shake po TID, each supplement provides 220 kcal and 10 grams of protein once advanced  Provide MVI daily  NUTRITION DIAGNOSIS:   Severe Malnutrition related to chronic illness(recurrent n/v, CVA) as evidenced by energy intake < or equal to 75% for > or equal to 1 month, severe muscle depletion, moderate fat depletion.  GOAL:   Patient will meet greater than or equal to 90% of their needs  MONITOR:   PO intake, Supplement acceptance, Weight trends, Labs, I & O's, Diet advancement  REASON FOR ASSESSMENT:   Malnutrition Screening Tool    ASSESSMENT:   Patient with PMH significant for HTN, HLD, DM, stroke, GERD, CHF, colitis, sickle cell trait, left ventricle mural thrombus, GERD, depression, anxiety, chronic nausea, vomiting abdominal pain. Presents this admission with nausea, vomiting and abdominal pain from unclear etiology.    Husband provided history. Pt's appetite has been off/on over the last couple of months due to ongoing nausea with vomiting. States when pt isn't nauseous she can tolerate 2 meals daily that consist of grilled chicken with mashed potatoes and pinto beans with tomatoes/okra. Pt drinks two Glucerna's daily when able. Husband unable to narrow down how often pt experiences nausea/vomiting but states over the last week, she ate two out of the seven days. Pts diet advanced to clears this am. Will provide Glucerna once able.   Husband denies pt has any recent wt loss from UBW of 120 lb. Records indicate pt weighed 106 lb on 1/15 and 102 lb this admission. With CHF history, unable to determine how much is actual dry wt loss versus fluid loss.   Medications reviewed and include: 40 mg lasix once daily, SSI, lantus, Mg sulfate Labs reviewed: K 3.3 (L) Mg 1.6 (L) CBG 145-205    NUTRITION - FOCUSED PHYSICAL EXAM:    Most Recent Value  Orbital Region  Mild depletion  Upper Arm Region  Severe depletion  Thoracic and Lumbar Region  Unable to assess  Buccal Region  Moderate depletion  Temple Region  Severe depletion  Clavicle Bone Region  Severe depletion  Clavicle and Acromion Bone Region  Severe depletion  Scapular Bone Region  Unable to assess  Dorsal Hand  Moderate depletion  Patellar Region  Severe depletion  Anterior Thigh Region  Severe depletion  Posterior Calf Region  Severe depletion  Edema (RD Assessment)  None  Hair  Reviewed  Eyes  Reviewed  Mouth  Reviewed  Skin  Reviewed  Nails  Reviewed     Diet Order:   Diet Order            Diet clear liquid Room service appropriate? Yes; Fluid consistency: Thin  Diet effective now              EDUCATION NEEDS:   Education needs have been addressed  Skin:  Skin Assessment: Reviewed RN Assessment  Last BM:  PTA  Height:   Ht Readings from Last 1 Encounters:  12/17/18 5\' 3"  (1.6 m)    Weight:   Wt Readings from Last 1 Encounters:  12/17/18 46.7 kg    Ideal Body Weight:  52.3 kg  BMI:  Body mass index is 18.24 kg/m.  Estimated Nutritional Needs:   Kcal:  1500-1700 kcal  Protein:  75-90 grams  Fluid:  >/= 1.5 L/day   Mariana Single RD, LDN Clinical Nutrition  Pager # - [phone]

## 2018-12-17 NOTE — ED Notes (Signed)
Patient transported to CT 

## 2018-12-17 NOTE — Consult Note (Addendum)
Cardiology Consultation:   Patient ID: Kristen Lozano MRN: [id number]; DOB: 1956/06/01  Admit date: 12/16/2018 Date of Consult: 12/17/2018  Primary Care Provider: Audley Hose, MD Primary Cardiologist: Pixie Casino, MD  Primary Electrophysiologist:  None   Patient Profile:   Kristen Lozano is a 63 y.o. 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 Grandville Silos.  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  She has had recent multiple hospitalizations and ER visits regarding tachycardia and nausea/vomiting often with inability to take medications.  Kristen Lozano was admitted again on 12/16/2018 for nausea/vomiting and abdominal pain.  The patient has some cognitive deficits related to her prior stroke and is able to give some limited information.  Her husband is present and provides most of the information.  He says that she has intermittent periods of abdominal pain with nausea and vomiting and when this occurs she is unable to take any medications.  She had not taken any medications for 2 days prior to presentation.  When asked about chest pain the patient states that she does have intermittent pressure across her chest.  She also says that she occasionally has shortness of breath.  She has no peripheral edema.  She was reclining in bed upon my arrival without any respiratory difficulty.  She denies any chest or abdominal pain presently and is actually asking if she can eat.  Kristen Lozano is expressing his  frustration about her repeated episodes of abdominal pain/nausea/vomiting with no known cause found.  Kristen Lozano was hospitalized in September 2019 with acute on chronic systolic and diastolic heart failure as well as persistent nausea and vomiting and abdominal pain.  An echocardiogram at that time showed a decline in LVEF down to 20-25%.  She was seen in the office on 10/07/2018 by Dr. Debara Pickett at which time there was discussion of cardiac catheterization to evaluate for ischemic cause of decreased EF.  The patient was not entirely verbal and it was unclear if she could consent for herself.  Her sister is her power of attorney and they were to consider ischemic evaluation and follow-up.  She was admitted to the hospital in November with intractable nausea and vomiting.  At the time she had a heart rate in the 130s.  Troponin was minimally elevated and flat.  It was decided to go ahead and do a cardiac cath to evaluate for ischemic cause of decreased EF.  She underwent a left heart cath on 10/14/18 that showed very minimal CAD.  It was felt that her tachycardia is likely contributing to decreased EF.  Metoprolol was increased and attempted to add ivabradine however this was declined by her insurance company.  Labs are significant for BNP of 1200, troponins 0.54>0.74>1.15.   Past Medical History:  Diagnosis Date  . Acute combined systolic and diastolic HF (heart failure) ([place]) 10/16/2018  . Allergic rhinitis    Requires cetirizine, singulair, and fluticasone.  . Anxiety    Has been on Xanax since 2009. Uses it for stress, anxiety, and insomnia. No contract yet.  . Arthritis   . CHF (congestive  heart failure) (H[facility])    EF 35% after stroke, presumed ischemic  . Chronic pain    Has OA of knees B. No Xrays in echart. Not requiring narcotics.  . Colitis 12/2017  . Depression   . Diabetes mellitus    Type 2, non insulin dependent. Was dx'd prior to 2008.  Marland Kitchen Hyperglycemia   . Hyperlipemia   .  Hypertension   . Sickle cell trait ([place])   . Stroke [facility]) 09/05/14   Dominant left MCA infarcts secondary to unknown embolic source     Past Surgical History:  Procedure Laterality Date  . BIOPSY  09/23/2018   Procedure: BIOPSY;  Surgeon: Yetta Flock, MD;  Location: [facility] ENDOSCOPY;  Service: Gastroenterology;;  . ESOPHAGOGASTRODUODENOSCOPY (EGD) WITH PROPOFOL N/A 09/23/2018   Procedure: ESOPHAGOGASTRODUODENOSCOPY (EGD) WITH PROPOFOL;  Surgeon: Yetta Flock, MD;  Location: [facility] ENDOSCOPY;  Service: Gastroenterology;  Laterality: N/A;  . RIGHT/LEFT HEART CATH AND CORONARY ANGIOGRAPHY N/A 10/14/2018   Procedure: RIGHT/LEFT HEART CATH AND CORONARY ANGIOGRAPHY;  Surgeon: Troy Sine, MD;  Location: [place] CV LAB;  Service: Cardiovascular;  Laterality: N/A;  . TEE WITHOUT CARDIOVERSION N/A 09/06/2014   Procedure: TRANSESOPHAGEAL ECHOCARDIOGRAM (TEE);  Surgeon: Pixie Casino, MD;  Location: [facility] ENDOSCOPY;  Service: Cardiovascular;  Laterality: N/A;     Home Medications:  Prior to Admission medications   Medication Sig Start Date End Date Taking? Authorizing Provider  ASPIRIN LOW DOSE 81 MG EC tablet Take 1 tablet (81 mg total) by mouth daily. Patient taking differently: Take 81 mg by mouth daily.  01/22/18  Yes Hilty, Nadean Corwin, MD  atorvastatin (LIPITOR) 20 MG tablet Take 1 tablet (20 mg total) by mouth daily at 6 PM. Please schedule appointment for refills. 01/02/18  Yes Hilty, Nadean Corwin, MD  furosemide (LASIX) 40 MG tablet Take 1 tablet (40 mg total) by mouth daily. 10/17/18  Yes Isaiah Serge, NP  GLUCERNA (GLUCERNA) LIQD Take 237 mLs by mouth daily.   Yes [provider]  LANTUS SOLOSTAR 100 UNIT/ML Solostar Pen Inject 10 Units into the skin at bedtime.  12/09/18  Yes [provider]  losartan (COZAAR) 25 MG tablet Take 0.5 tablets (12.5 mg total) by mouth daily. 10/17/18  Yes Isaiah Serge, NP  metFORMIN (GLUCOPHAGE-XR) 500 MG 24 hr tablet Take 2  tablets (1,000 mg total) by mouth 2 (two) times daily. 10/17/18  Yes Isaiah Serge, NP  metoCLOPramide (REGLAN) 5 MG tablet Take 1 tablet (5 mg total) by mouth every 8 (eight) hours as needed for nausea. 11/24/18 11/24/19 Yes Dhungel, Nishant, MD  metoprolol succinate (TOPROL-XL) 100 MG 24 hr tablet Take 1 tablet (100 mg total) by mouth daily. Take with or immediately following a meal.  In the Morning Patient taking differently: Take 100 mg by mouth every morning. Take with or immediately following a meal.  In the Morning 10/17/18  Yes Isaiah Serge, NP  metoprolol succinate (TOPROL-XL) 50 MG 24 hr tablet Take 1 tablet (50 mg total) by mouth at bedtime. Take with or immediately following a meal. 10/16/18  Yes Isaiah Serge, NP  pantoprazole (PROTONIX) 40 MG tablet Take 1 tablet (40 mg total) by mouth daily with supper. 10/16/18  Yes Isaiah Serge, NP  potassium chloride (K-DUR) 10 MEQ tablet Take 1 tablet (10 mEq total) by mouth daily. 09/02/18  Yes Georgette Shell, MD  Acetaminophen 500 MG coapsule Take 500 mg by mouth daily as needed for mild pain or  headache.     [provider]  docusate sodium (COLACE) 100 MG capsule Take 1 capsule (100 mg total) by mouth 2 (two) times daily as needed for mild constipation. Patient not taking: Reported on 12/09/2018 10/16/18   Isaiah Serge, NP  feeding supplement, ENSURE ENLIVE, (ENSURE ENLIVE) LIQD Take 237 mLs by mouth 3 (three) times daily between meals. Patient not taking: Reported on 12/09/2018 08/17/18   Oretha Milch D, MD  feeding supplement, ENSURE ENLIVE, (ENSURE ENLIVE) LIQD Take 237 mLs by mouth 3 (three) times daily between meals. Patient not taking: Reported on 12/09/2018 11/24/18   Dhungel, Flonnie Overman, MD  guaiFENesin (ROBITUSSIN) 100 MG/5ML SOLN Take 15 mLs (300 mg total) by mouth 4 (four) times daily. Patient taking differently: Take 15 mLs by mouth every 6 (six) hours as needed for cough.  10/16/18   Isaiah Serge, NP    insulin glargine (LANTUS) 100 UNIT/ML injection Inject 0.1 mLs (10 Units total) into the skin at bedtime. Patient not taking: Reported on 12/16/2018 09/25/18   Oswald Hillock, MD  ivabradine (CORLANOR) 5 MG TABS tablet Take 1 tablet (5 mg total) by mouth 2 (two) times daily with a meal. Patient not taking: Reported on 12/09/2018 10/16/18   Isaiah Serge, NP  ondansetron (ZOFRAN ODT) 8 MG disintegrating tablet Take 1 tablet (8 mg total) by mouth every 8 (eight) hours as needed for nausea or vomiting. 12/09/18   Lacretia Leigh, MD  oxyCODONE (OXY IR/ROXICODONE) 5 MG immediate release tablet Take 1 tablet (5 mg total) by mouth every 6 (six) hours as needed for moderate pain. Patient not taking: Reported on 12/09/2018 11/24/18   Dhungel, Flonnie Overman, MD  promethazine (PHENERGAN) 12.5 MG tablet Take 1 tablet (12.5 mg total) by mouth every 6 (six) hours as needed for nausea or vomiting. Patient not taking: Reported on 12/09/2018 11/24/18   Dhungel, Flonnie Overman, MD  sucralfate (CARAFATE) 1 GM/10ML suspension Take 10 mLs (1 g total) by mouth 4 (four) times daily -  with meals and at bedtime for 10 days. 10/19/18 12/09/18  Margette Fast, MD  warfarin (COUMADIN) 5 MG tablet Take 1 to 1.5 tablets by mouth daily as directed by coumadin clinic  Take 1.5 pills on Mondays and Fridays and 1 pill Tuesday, Wed. THursday, Saturday and Sunday Patient taking differently: Take 5-7.5 mg by mouth See admin instructions. Take 1.5 pills (7.58m totally) by mouth on Mondays and Fridays and take 1 tablet (562mtotally) by mouth on Tuesday, Wed. THursday, Saturday and Sunday 10/16/18   InIsaiah SergeNP    Inpatient Medications: Scheduled Meds: . aspirin EC  81 mg Oral Daily  . atorvastatin  20 mg Oral q1800  . feeding supplement (GLUCERNA SHAKE)  237 mL Oral Daily  . furosemide  40 mg Oral Daily  . insulin aspart  0-9 Units Subcutaneous TID WC  . insulin glargine  5 Units Subcutaneous QHS  . losartan  12.5 mg Oral Daily  .  metoprolol succinate  100 mg Oral QPC breakfast  . metoprolol succinate  50 mg Oral QHS  . pantoprazole  40 mg Oral Q supper  . sodium chloride flush  3 mL Intravenous Once  . sucralfate  1 g Oral TID WC & HS  . warfarin  7.5 mg Oral Once  . Warfarin - Pharmacist Dosing Inpatient   Does not apply q1800   Continuous Infusions: . heparin 800 Units/hr (12/17/18 0526)  . magnesium sulfate 1 - 4 g bolus IVPB  PRN Meds: acetaminophen, guaiFENesin, hydrALAZINE, metoCLOPramide, morphine injection, nitroGLYCERIN, ondansetron, zolpidem  Allergies:   No Known Allergies  Social History:   Social History   Socioeconomic History  . Marital status: Legally Separated    Spouse name: Not on file  . Number of children: 2  . Years of education: 9 TH  . Highest education level: Not on file  Occupational History  . Occupation: Disabled  Social Needs  . Financial resource strain: Not on file  . Food insecurity:    Worry: Not on file    Inability: Not on file  . Transportation needs:    Medical: Not on file    Non-medical: Not on file  Tobacco Use  . Smoking status: Never Smoker  . Smokeless tobacco: Never Used  Substance and Sexual Activity  . Alcohol use: No    Alcohol/week: 0.0 standard drinks  . Drug use: No  . Sexual activity: Not Currently  Lifestyle  . Physical activity:    Days per week: Not on file    Minutes per session: Not on file  . Stress: Not on file  Relationships  . Social connections:    Talks on phone: Not on file    Gets together: Not on file    Attends religious service: Not on file    Active member of club or organization: Not on file    Attends meetings of clubs or organizations: Not on file    Relationship status: Not on file  . Intimate partner violence:    Fear of current or ex partner: Not on file    Emotionally abused: Not on file    Physically abused: Not on file    Forced sexual activity: Not on file  Other Topics Concern  . Not on file    Social History Narrative   Patient is married with 2 children.   Patient is right handed.   Patient has 9 th grade education.   Patient drinks 2 cups daily.    Family History:    Family History  Problem Relation Age of Onset  . Diabetes Mother   . Hypertension Mother   . Heart disease Father   . Heart attack Father   . Hypertension Father   . Diabetes Father   . Ovarian cancer Sister   . Liver cancer Sister   . Sickle cell anemia Daughter   . Schizophrenia Daughter   . Hypertension Sister   . Hypertension Brother   . Hypertension Daughter   . Kidney disease Sister        x2  . Kidney disease Brother   . Stroke Neg Hx   . Esophageal cancer Neg Hx   . Colon cancer Neg Hx   . Colon polyps Neg Hx      ROS:  Please see the history of present illness.   All other ROS reviewed and negative.     Physical Exam/Data:   Vitals:   12/17/18 0315 12/17/18 0330 12/17/18 0448 12/17/18 0503  BP: (!) 139/98 (!) 143/92  (!) 118/96  Pulse: (!) 120 (!) 120  67  Resp: (!) 22 17  16   Temp:    98.9 F (37.2 C)  TempSrc:    Oral  SpO2: 99% 99%  96%  Weight:   46.7 kg   Height:   5' 3"  (1.6 m)     Intake/Output Summary (Last 24 hours) at 12/17/2018 0836 Last data filed at 12/17/2018 0030 Gross per 24 hour  Intake 250 ml  Output -  Net 250 ml   Last 3 Weights 12/17/2018 12/16/2018 12/09/2018  Weight (lbs) 102 lb 15.3 oz 105 lb 13.1 oz 106 lb 8 oz  Weight (kg) 46.7 kg 48 kg 48.308 kg     Body mass index is 18.24 kg/m.  General: Thin female, in no acute distress HEENT: normal, has very few teeth Lymph: no adenopathy Neck: no JVD Endocrine:  No thryomegaly Vascular: No carotid bruits; FA pulses 2+ bilaterally without bruits  Cardiac:  normal S1, S2; irregularly irregular rhythm; no murmur Lungs:  clear to auscultation bilaterally, no wheezing, rhonchi or rales  Abd: soft, nontender, no hepatomegaly  Ext: no edema Musculoskeletal:  No deformities, BUE and BLE strength normal  and equal Skin: warm and dry  Neuro:  CNs 2-12 intact, no focal abnormalities noted Psych:  Normal affect   EKG:  The EKG was personally reviewed and demonstrates:  Sinus tachycardia, 125 bpm, PACs, LVH, Consider right atrial enlargement Nonspecific T abnormalities, lateral leads Telemetry:  Telemetry was personally reviewed and demonstrates: Sinus tachycardia with rates 100s-110s, frequent PACs  Relevant CV Studies:  RIGHT/LEFT HEART CATH AND CORONARY ANGIOGRAPHY 10/14/18  Conclusion    Mid RCA lesion is 15% stenosed.   No significant coronary obstructive disease with minimal 10 to 20% smooth narrowing in the mid RCA and otherwise normal LAD and left circumflex vessel.  Mild right heart pressure elevation with mild pulmonary hypertension with a PA mean pressure of 26 mm.  LVEDP 16 mm.  Recent echo Doppler with EF 20 to 25% and with patient's history of LV thrombus, left ventricular thrombus was not done.  RECOMMENDATION: Optimal guideline medical therapy for heart failure with reduced EF.  If blood pressure allows, consider Entresto.  With increased heart rate consider a ivabradine. Recommend resumption of anticoagulation with warfarin tomorrow.    Echocardiogram 08/10/2018 Study Conclusions - Left ventricle: The cavity size was normal. Wall thickness was   normal. Systolic function was severely reduced. The estimated   ejection fraction was in the range of 20% to 25%. Diffuse   hypokinesis. The study is not technically sufficient to allow   evaluation of LV diastolic function. - Ventricular septum: Septal motion showed abnormal function and   dyssynergy. - Aortic valve: There was trivial regurgitation. - Mitral valve: There was moderate regurgitation. - Left atrium: The atrium was mildly dilated. - Tricuspid valve: There was trivial regurgitation. - Pulmonary arteries: Systolic pressure was moderately increased.   PA peak pressure: 53 mm Hg (S). - Pericardium,  extracardiac: A trivial pericardial effusion was   identified. There was a left pleural effusion.  Impressions: - EF is reduced when compared to prior study (40%)  Laboratory Data:  Chemistry Recent Labs  Lab 12/16/18 2022  NA 137  K 3.3*  CL 99  CO2 27  GLUCOSE 170*  BUN 12  CREATININE 0.68  CALCIUM 8.6*  GFRNONAA >60  GFRAA >60  ANIONGAP 11    Recent Labs  Lab 12/16/18 2022  PROT 6.8  ALBUMIN 3.7  AST 32  ALT 25  ALKPHOS 48  BILITOT 1.7*   Hematology Recent Labs  Lab 12/16/18 2022  WBC 10.3  RBC 6.35*  HGB 14.0  HCT 44.1  MCV 69.4*  MCH 22.0*  MCHC 31.7  RDW 18.2*  PLT 201   Cardiac Enzymes Recent Labs  Lab 12/17/18 0521  TROPONINI 1.15*    Recent Labs  Lab 12/17/18 0001 12/17/18 0323  TROPIPOC 0.54* 0.74*    BNP Recent  Labs  Lab 12/16/18 2350  BNP 1,200.8*    DDimer No results for input(s): DDIMER in the last 168 hours.  Radiology/Studies:  Ct Head Wo Contrast  Result Date: 12/17/2018 CLINICAL DATA:  63 year old female with altered mental status. EXAM: CT HEAD WITHOUT CONTRAST TECHNIQUE: Contiguous axial images were obtained from the base of the skull through the vertex without intravenous contrast. COMPARISON:  Head CT dated 10/25/2018 and MRI dated 10/26/2018 FINDINGS: Brain: The ventricles and sulci appropriate size for patient's age. There is mild chronic microvascular ischemic changes. Bilateral MCA territory old infarcts as seen on the prior CT. There is no acute intracranial hemorrhage. No mass effect or midline shift. No extra-axial fluid collection. Vascular: No hyperdense vessel or unexpected calcification. Skull: Normal. Negative for fracture or focal lesion. Sinuses/Orbits: No acute finding. Other: None IMPRESSION: 1. No acute intracranial hemorrhage. 2. 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 Electronically Signed   By: Anner Crete M.D.   On: 12/17/2018 00:23   Ct Venogram  Head  Result Date: 12/17/2018 CLINICAL DATA:  Altered mental status. Assess for dural venous sinus thrombosis. History of infarcts, sickle cell trait. EXAM: CT VENOGRAM HEAD TECHNIQUE: CT HEAD December 17, 2018. CONTRAST:  65m OMNIPAQUE IOHEXOL 300 MG/ML  SOLN COMPARISON:  CT HEAD December 17, 2018. FINDINGS: Normal contrast opacification of the superior sagittal sinus, torcula of the Herophili, bilateral transverse, sigmoid sinuses and included internal jugular veins. No suspicious filling defects or focal stenoses. Normal appearance of the internal cerebral veins. IMPRESSION: No dural venous sinus thrombosis. Electronically Signed   By: CElon AlasM.D.   On: 12/17/2018 03:06    Assessment and Plan:   Elevated troponin -troponins 0.54>0.74>1.15.  -Recent LHC on 10/14/18 showed no significant CAD. -The patient admits to recent intermittent pressure across her chest, but she is unclear and this could be related to her abdominal pain.  Currently she is having no chest or abdominal pain. -With recent cath showing no CAD, elevated troponins are unlikely related to myocardial ischemia.  More likely related to demand ischemia in the setting of low EF and tachycardia with patient unable to take beta-blocker due to N/V.  -Continue to monitor troponin until downtrending and monitor symptoms and vital signs -IV heparin is infusing -I do not think that a cath is warranted at this time. Could check an echocardiogram for any new changes. Will discuss with Dr. NAcie Fredrickson  Nonischemic cardiomyopathy/acute on chronic combined CHF -EF down to 20-25% on echo in 07/2018.  Cardiac cath on 10/15/2019 showed no significant CAD.  Patient with persistent tachycardia which may have contributed to her worsened LV function. -Heart failure management includes losartan 12.5 mg daily, Toprol-XL, Lasix 40 mg daily.  Patient was unable to take her medications for 2 days prior to presentation due to nausea and vomiting. -BNP of  1200 -The patient is very thin, has no edema, lungs clear, no dyspnea or orthopnea. -Would continue current medical therapy.  Tachycardia -Home management includes Toprol-XL 100 mg in the morning, 50 mg in the evening. -Ivabradine was ordered, however prior authorization was declined by her insurance company per review of notes.  -Patient was unable to take her beta-blocker for at least 2 days prior to presentation due to nausea and vomiting.  Heart rate was 125 bpm on presentation. -Heart rate is now 100's-110's. BP was elevated  Yesterday but 118/96. Continue to monitor and to see if Toprol could be increased.  -Really need to treat nausea  so that she does not miss taking her BB at home.   History of stroke -Persistent cognitive deficits after stroke.  Her sister is her POA. -Patient continues on aspirin and atorvastatin.  Prior LV thrombus -LV thrombus noted on echo in 2017.  Patient is on warfarin.  Persistent nausea and vomiting -Patient has had recent multiple hospital and ER visits related to abdominal pain/nausea vomiting.  In September she had a negative abdominal CT, gastric emptying study and EGD.  Biopsy of the stomach showed mild chronic gastritis.  Diabetes type 2 -A1c 8.6     For questions or updates, please contact [place] Please consult [email or web address] for contact info under     Signed, Daune Perch, NP  12/17/2018 8:36 AM   Attending Note:   The patient was seen and examined.  Agree with assessment and plan as noted above.  Changes made to the above note as needed.  Patient seen and independently examined with Pecolia Ades, NP .   We discussed all aspects of the encounter. I agree with the assessment and plan as stated above.  1.  Chronic systolic congestive heart failure: The patient had a heart catheterization several weeks ago which revealed nonobstructive coronary artery disease.  She has a nonischemic cardiomyopathy with an ejection fraction of  around 25%.  This is likely caused by tachycardia induced cardiomyopathy. At home she is on a fairly standard course of CHF therapy including Lasix 40 mg a day, losartan 25 mg a day, Toprol-XL 100 mg each morning and 50 mg at night.  We have prescribed Corlonor but insurance has not approved it .  She is had multiple admissions for nausea and vomiting and has difficulty keeping her medications down. Because of this she develops recurrent tachycardia and winds up in the hospital.  I think that the nausea and vomiting and inability to take medications and inability to eat and drink properly is the main issue. She would not be able to effectively treat her congestive heart failure unless she is able to take and keep down her medications. Further work-up per the internal medicine team.  2.  Ventricular thrombus: The patient is currently on heparin.  She may be restarted on her Coumadin which she is taking p.o. adequately.  3.   Nausea and vomiting: The patient has had an extensive work-up from gastroenterology.  No cause can be found at this point.   4.  Sinus tachycardia: Continue Toprol-XL as prescribed.  We can also use metoprolol IV as needed when she is not eating and drinking properly.  5.  Elevated troponin levels: This is most likely due to demand ischemia.  She had a heart catheterization October 14, 2018 which did not reveal any significant coronary artery disease. No further work-up indicated at this time.    I have spent a total of 40 minutes with patient reviewing hospital  notes , telemetry, EKGs, labs and examining patient as well as establishing an assessment and plan that was discussed with the patient. > 50% of time was spent in direct patient care.    Thayer Headings, Brooke Bonito., MD, [facility] 12/17/2018, 12:59 PM [address],  [place] Pager [phone]

## 2018-12-17 NOTE — Progress Notes (Signed)
  Echocardiogram 2D Echocardiogram has been performed.  Kristen Lozano 12/17/2018, 2:35 PM

## 2018-12-17 NOTE — H&P (Signed)
History and Physical    Kristen Lozano IEP:[id number] DOB: 27-Aug-1956 DOA: 12/16/2018  Referring MD/NP/PA:   PCP: Audley Hose, MD   Patient coming from:  The patient is coming from home.  At baseline, pt is independent for most of ADL.        Chief Complaint: Nausea, vomiting, abdominal pain  HPI: Kristen Lozano is a 63 y.o. female with medical history significant of hypertension, hyperlipidemia, diabetes mellitus, stroke, GERD, CHF with EF of 20%, colitis, sickle cell trait, left ventricle mural thrombus on Coumadin, GERD, depression, anxiety, chronic nausea, vomiting abdominal pain, who presents with nausea, vomiting and abdominal pain.  Patient has chronic nausea, vomiting, abdominal pain.  Has multiple ED visit and recent admission from 12/28-12/31.  Per discharge summary, "work-up done in the past including EGD and gastric emptying study without clear diagnosis.  CT of the abdomen was done in November showed some liver congestion. KUB without any stool burden".  Per her daughter, patient continues to have nausea, vomiting and abdominal pain intermittently.  She has vomited several times with nonbilious, nonbloody vomitus.  Her abdominal pain is located in the central abdomen, intermittent, sharp, nonradiating.  Patient does not have diarrhea.  Patient has mild cough.  She is drowsy and has some mild confusion, but is still orientated x3 when I saw patient in ED. She is not sure whether she has any chest pain.  No unilateral weakness.  No facial droop or slurred speech.  Denies symptoms of UTI.  ED Course: pt was found to have WBC 10.3, BNP 1200, INR 1.54, PTT 28, troponin 0.57-->0.74, lipase 43, negative urinalysis, potassium 3.3, creatinine and BUN normal, ammonia 13, temperature normal, heart rate 42- 116, no tachypnea, oxygen saturation 97% on room air.  CT head is negative for acute intracranial abnormalities, but showed bilateral MCA infarction.  MR venogram is negative.  Patient is  placed on telemetry bed for observation.  Review of Systems:   General: no fevers, chills, no body weight gain, has poor appetite, has fatigue HEENT: no blurry vision, hearing changes or sore throat Respiratory: no dyspnea, has coughing, no wheezing CV: no chest pain, no palpitations GI: has nausea, vomiting, abdominal pain, no diarrhea, constipation GU: no dysuria, burning on urination, increased urinary frequency, hematuria  Ext: has leg edema Neuro: no unilateral weakness, numbness, or tingling, no vision change or hearing loss. Has mild confusion. Skin: no rash, no skin tear. MSK: No muscle spasm, no deformity, no limitation of range of movement in spin Heme: No easy bruising.  Travel history: No recent long distant travel.  Allergy: No Known Allergies  Past Medical History:  Diagnosis Date  . Acute combined systolic and diastolic HF (heart failure) ([place]) 10/16/2018  . Allergic rhinitis    Requires cetirizine, singulair, and fluticasone.  . Anxiety    Has been on Xanax since 2009. Uses it for stress, anxiety, and insomnia. No contract yet.  . Arthritis   . CHF (congestive heart failure) (HCC)    EF 35% after stroke, presumed ischemic  . Chronic pain    Has OA of knees B. No Xrays in echart. Not requiring narcotics.  . Colitis 12/2017  . Depression   . Diabetes mellitus    Type 2, non insulin dependent. Was dx'd prior to 2008.  Marland Kitchen Hyperglycemia   . Hyperlipemia   . Hypertension   . Sickle cell trait ([place])   . Stroke [facility]) 09/05/14   Dominant left MCA infarcts secondary to unknown  embolic source     Past Surgical History:  Procedure Laterality Date  . BIOPSY  09/23/2018   Procedure: BIOPSY;  Surgeon: Yetta Flock, MD;  Location: [facility] ENDOSCOPY;  Service: Gastroenterology;;  . ESOPHAGOGASTRODUODENOSCOPY (EGD) WITH PROPOFOL N/A 09/23/2018   Procedure: ESOPHAGOGASTRODUODENOSCOPY (EGD) WITH PROPOFOL;  Surgeon: Yetta Flock, MD;  Location: [facility] ENDOSCOPY;   Service: Gastroenterology;  Laterality: N/A;  . RIGHT/LEFT HEART CATH AND CORONARY ANGIOGRAPHY N/A 10/14/2018   Procedure: RIGHT/LEFT HEART CATH AND CORONARY ANGIOGRAPHY;  Surgeon: Troy Sine, MD;  Location: [facility];  Service: Cardiovascular;  Laterality: N/A;  . TEE WITHOUT CARDIOVERSION N/A 09/06/2014   Procedure: TRANSESOPHAGEAL ECHOCARDIOGRAM (TEE);  Surgeon: Pixie Casino, MD;  Location: [facility] ENDOSCOPY;  Service: Cardiovascular;  Laterality: N/A;    Social History:  reports that she has never smoked. She has never used smokeless tobacco. She reports that she does not drink alcohol or use drugs.  Family History:  Family History  Problem Relation Age of Onset  . Diabetes Mother   . Hypertension Mother   . Heart disease Father   . Heart attack Father   . Hypertension Father   . Diabetes Father   . Ovarian cancer Sister   . Liver cancer Sister   . Sickle cell anemia Daughter   . Schizophrenia Daughter   . Hypertension Sister   . Hypertension Brother   . Hypertension Daughter   . Kidney disease Sister        x2  . Kidney disease Brother   . Stroke Neg Hx   . Esophageal cancer Neg Hx   . Colon cancer Neg Hx   . Colon polyps Neg Hx      Prior to Admission medications   Medication Sig Start Date End Date Taking? Authorizing Provider  ASPIRIN LOW DOSE 81 MG EC tablet Take 1 tablet (81 mg total) by mouth daily. Patient taking differently: Take 81 mg by mouth daily.  01/22/18  Yes Hilty, Nadean Corwin, MD  atorvastatin (LIPITOR) 20 MG tablet Take 1 tablet (20 mg total) by mouth daily at 6 PM. Please schedule appointment for refills. 01/02/18  Yes Hilty, Nadean Corwin, MD  furosemide (LASIX) 40 MG tablet Take 1 tablet (40 mg total) by mouth daily. 10/17/18  Yes Isaiah Serge, NP  GLUCERNA (GLUCERNA) LIQD Take 237 mLs by mouth daily.   Yes [provider]  LANTUS SOLOSTAR 100 UNIT/ML Solostar Pen Inject 10 Units into the skin at bedtime.  12/09/18  Yes [provider]  losartan (COZAAR) 25 MG tablet Take 0.5 tablets (12.5 mg total) by mouth daily. 10/17/18  Yes Isaiah Serge, NP  metFORMIN (GLUCOPHAGE-XR) 500 MG 24 hr tablet Take 2 tablets (1,000 mg total) by mouth 2 (two) times daily. 10/17/18  Yes Isaiah Serge, NP  metoCLOPramide (REGLAN) 5 MG tablet Take 1 tablet (5 mg total) by mouth every 8 (eight) hours as needed for nausea. 11/24/18 11/24/19 Yes Dhungel, Nishant, MD  metoprolol succinate (TOPROL-XL) 100 MG 24 hr tablet Take 1 tablet (100 mg total) by mouth daily. Take with or immediately following a meal.  In the Morning Patient taking differently: Take 100 mg by mouth every morning. Take with or immediately following a meal.  In the Morning 10/17/18  Yes Isaiah Serge, NP  metoprolol succinate (TOPROL-XL) 50 MG 24 hr tablet Take 1 tablet (50 mg total) by mouth at bedtime. Take with or immediately following a meal. 10/16/18  Yes Cecilie Kicks  R, NP  pantoprazole (PROTONIX) 40 MG tablet Take 1 tablet (40 mg total) by mouth daily with supper. 10/16/18  Yes Isaiah Serge, NP  potassium chloride (K-DUR) 10 MEQ tablet Take 1 tablet (10 mEq total) by mouth daily. 09/02/18  Yes Georgette Shell, MD  Acetaminophen 500 MG coapsule Take 500 mg by mouth daily as needed for mild pain or headache.     [provider]  docusate sodium (COLACE) 100 MG capsule Take 1 capsule (100 mg total) by mouth 2 (two) times daily as needed for mild constipation. Patient not taking: Reported on 12/09/2018 10/16/18   Isaiah Serge, NP  feeding supplement, ENSURE ENLIVE, (ENSURE ENLIVE) LIQD Take 237 mLs by mouth 3 (three) times daily between meals. Patient not taking: Reported on 12/09/2018 08/17/18   Oretha Milch D, MD  feeding supplement, ENSURE ENLIVE, (ENSURE ENLIVE) LIQD Take 237 mLs by mouth 3 (three) times daily between meals. Patient not taking: Reported on 12/09/2018 11/24/18   Dhungel, Flonnie Overman, MD  guaiFENesin (ROBITUSSIN) 100 MG/5ML SOLN  Take 15 mLs (300 mg total) by mouth 4 (four) times daily. Patient taking differently: Take 15 mLs by mouth every 6 (six) hours as needed for cough.  10/16/18   Isaiah Serge, NP  insulin glargine (LANTUS) 100 UNIT/ML injection Inject 0.1 mLs (10 Units total) into the skin at bedtime. Patient not taking: Reported on 12/16/2018 09/25/18   Oswald Hillock, MD  ivabradine (CORLANOR) 5 MG TABS tablet Take 1 tablet (5 mg total) by mouth 2 (two) times daily with a meal. Patient not taking: Reported on 12/09/2018 10/16/18   Isaiah Serge, NP  ondansetron (ZOFRAN ODT) 8 MG disintegrating tablet Take 1 tablet (8 mg total) by mouth every 8 (eight) hours as needed for nausea or vomiting. 12/09/18   Lacretia Leigh, MD  oxyCODONE (OXY IR/ROXICODONE) 5 MG immediate release tablet Take 1 tablet (5 mg total) by mouth every 6 (six) hours as needed for moderate pain. Patient not taking: Reported on 12/09/2018 11/24/18   Dhungel, Flonnie Overman, MD  promethazine (PHENERGAN) 12.5 MG tablet Take 1 tablet (12.5 mg total) by mouth every 6 (six) hours as needed for nausea or vomiting. Patient not taking: Reported on 12/09/2018 11/24/18   Dhungel, Flonnie Overman, MD  sucralfate (CARAFATE) 1 GM/10ML suspension Take 10 mLs (1 g total) by mouth 4 (four) times daily -  with meals and at bedtime for 10 days. 10/19/18 12/09/18  Margette Fast, MD  warfarin (COUMADIN) 5 MG tablet Take 1 to 1.5 tablets by mouth daily as directed by coumadin clinic  Take 1.5 pills on Mondays and Fridays and 1 pill Tuesday, Wed. THursday, Saturday and Sunday Patient taking differently: Take 5-7.5 mg by mouth See admin instructions. Take 1.5 pills (7.5m totally) by mouth on Mondays and Fridays and take 1 tablet (571mtotally) by mouth on Tuesday, Wed. THursday, Saturday and Sunday 10/16/18   InIsaiah SergeNP    Physical Exam: Vitals:   12/17/18 0315 12/17/18 0330 12/17/18 0448 12/17/18 0503  BP: (!) 139/98 (!) 143/92  (!) 118/96  Pulse: (!) 120 (!) 120  67    Resp: (!) 22 17  16   Temp:    98.9 F (37.2 C)  TempSrc:    Oral  SpO2: 99% 99%  96%  Weight:   46.7 kg   Height:   5' 3"  (1.6 m)    General: Not in acute distress HEENT:       Eyes: PERRL, EOMI,  no scleral icterus.       ENT: No discharge from the ears and nose, no pharynx injection, no tonsillar enlargement.        Neck: No JVD, no bruit, no mass felt. Heme: No neck lymph node enlargement. Cardiac: S1/S2, RRR, No murmurs, No gallops or rubs. Respiratory:  No rales, wheezing, rhonchi or rubs. GI: Soft, nondistended, has mild tenderness in central abdomen, no rebound pain, no organomegaly, BS present. GU: No hematuria Ext: has trace leg edema bilaterally. 2+DP/PT pulse bilaterally. Musculoskeletal: No joint deformities, No joint redness or warmth, no limitation of ROM in spin. Skin: No rashes.  Neuro: drowsy, but oriented X3, cranial nerves II-XII grossly intact, moves all extremities. Psych: Patient is not psychotic, no suicidal or hemocidal ideation.  Labs on Admission: I have personally reviewed following labs and imaging studies  CBC: Recent Labs  Lab 12/16/18 2022  WBC 10.3  HGB 14.0  HCT 44.1  MCV 69.4*  PLT 852   Basic Metabolic Panel: Recent Labs  Lab 12/16/18 2022 12/17/18 0521  NA 137  --   K 3.3*  --   CL 99  --   CO2 27  --   GLUCOSE 170*  --   BUN 12  --   CREATININE 0.68  --   CALCIUM 8.6*  --   MG  --  1.6*   GFR: Estimated Creatinine Clearance: 53.1 mL/min (by C-G formula based on SCr of 0.68 mg/dL). Liver Function Tests: Recent Labs  Lab 12/16/18 2022  AST 32  ALT 25  ALKPHOS 48  BILITOT 1.7*  PROT 6.8  ALBUMIN 3.7   Recent Labs  Lab 12/16/18 2022  LIPASE 43   Recent Labs  Lab 12/16/18 2350  AMMONIA 13   Coagulation Profile: Recent Labs  Lab 12/17/18 0030  INR 1.54   Cardiac Enzymes: No results for input(s): CKTOTAL, CKMB, CKMBINDEX, TROPONINI in the last 168 hours. BNP (last 3 results) No results for input(s):  PROBNP in the last 8760 hours. HbA1C: No results for input(s): HGBA1C in the last 72 hours. CBG: Recent Labs  Lab 12/17/18 0452  GLUCAP 205*   Lipid Profile: No results for input(s): CHOL, HDL, LDLCALC, TRIG, CHOLHDL, LDLDIRECT in the last 72 hours. Thyroid Function Tests: No results for input(s): TSH, T4TOTAL, FREET4, T3FREE, THYROIDAB in the last 72 hours. Anemia Panel: No results for input(s): VITAMINB12, FOLATE, FERRITIN, TIBC, IRON, RETICCTPCT in the last 72 hours. Urine analysis:    Component Value Date/Time   COLORURINE YELLOW 12/16/2018 2219   APPEARANCEUR CLEAR 12/16/2018 2219   LABSPEC 1.019 12/16/2018 2219   PHURINE 6.0 12/16/2018 2219   GLUCOSEU NEGATIVE 12/16/2018 2219   HGBUR NEGATIVE 12/16/2018 2219   BILIRUBINUR NEGATIVE 12/16/2018 2219   KETONESUR 5 (A) 12/16/2018 2219   PROTEINUR >=300 (A) 12/16/2018 2219   UROBILINOGEN 1.0 09/07/2014 1405   NITRITE NEGATIVE 12/16/2018 2219   LEUKOCYTESUR TRACE (A) 12/16/2018 2219   Sepsis Labs: @LABRCNTIP (procalcitonin:4,lacticidven:4) )No results found for this or any previous visit (from the past 240 hour(s)).   Radiological Exams on Admission: Ct Head Wo Contrast  Result Date: 12/17/2018 CLINICAL DATA:  63 year old female with altered mental status. EXAM: CT HEAD WITHOUT CONTRAST TECHNIQUE: Contiguous axial images were obtained from the base of the skull through the vertex without intravenous contrast. COMPARISON:  Head CT dated 10/25/2018 and MRI dated 10/26/2018 FINDINGS: Brain: The ventricles and sulci appropriate size for patient's age. There is mild chronic microvascular ischemic changes. Bilateral MCA territory old infarcts  as seen on the prior CT. There is no acute intracranial hemorrhage. No mass effect or midline shift. No extra-axial fluid collection. Vascular: No hyperdense vessel or unexpected calcification. Skull: Normal. Negative for fracture or focal lesion. Sinuses/Orbits: No acute finding. Other: None  IMPRESSION: 1. No acute intracranial hemorrhage. 2. 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 Electronically Signed   By: Anner Crete M.D.   On: 12/17/2018 00:23   Ct Venogram Head  Result Date: 12/17/2018 CLINICAL DATA:  Altered mental status. Assess for dural venous sinus thrombosis. History of infarcts, sickle cell trait. EXAM: CT VENOGRAM HEAD TECHNIQUE: CT HEAD December 17, 2018. CONTRAST:  59m OMNIPAQUE IOHEXOL 300 MG/ML  SOLN COMPARISON:  CT HEAD December 17, 2018. FINDINGS: Normal contrast opacification of the superior sagittal sinus, torcula of the Herophili, bilateral transverse, sigmoid sinuses and included internal jugular veins. No suspicious filling defects or focal stenoses. Normal appearance of the internal cerebral veins. IMPRESSION: No dural venous sinus thrombosis. Electronically Signed   By: CElon AlasM.D.   On: 12/17/2018 03:06     EKG: Independently reviewed.  Sinus rhythm, QTC 491, LAD, LAE, poor R wave progression, PAC.  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  This is a chronic issue.  Patient had extensive work-up in the past without clear diagnosis made.  Lipase normal today.  No acute abdominal examination.  Etiology is not unclear.  May be related to internal organ edema secondary to CHF. -Placed on telemetry bed for observation -Supportive care -Continue Reglan and as needed Zofran, and prn morphine -may need to f/u with GI   Elevated troponin: Troponin 0.57-->0.74. Not sure if pt has  any chest pain. - cycle CE q6 x3 and repeat EKG in the am  - prn Nitroglycerin, Morphine, and aspirin, lipitor  - Risk factor stratification: will check FLP and A1C, UDS - did not order 2d echo--> please reevaluate pt in AM to decide if pt needs 2D echo. - inpt card consult was requested via Epic  LV (left ventricular) mural thrombus following MI: on coumadin, but INR=1.54 -start IV heparin per pharm -continue coumdine per pharm  Acute metabolic encephalopathy: pt is still oriented x3.  CT head is negative for acute intracranial abnormalities MR venogram is negative.  Etiology is not clear, likely multifactorial etiology, including electrolyte disturbance, elevated troponin, pain. -Frequent neuro check  HTN:  -Continue home medications: Cozaar, metoprolol -IV hydralazine prn  Stroke ([facility]: -on ASA, and lipitor  Chronic combined systolic and diastolic CHF (congestive heart failure) ([place]: BNP 1200, but patient has trace leg edema, but no JVD.  No acute respiratory distress.  Patient may have mild fluid overload, but no acute CHF exacerbation. -Continue home dose Lasix -Give extra dose of Lasix by IV 40 mg x 1 now  Hypokalemia and hypomagnesemia: K= 3.3 and Mg 1.6  on admission. - Repleted - Give 2 g of magnesium sulfate  Type 2 diabetes mellitus with vascular disease ([place]: Last A1c 8.3 on 11/22/18, poorly controled. Patient is taking metformin, Lantus at home -will decrease Lantus  dose from 10 units to 5 units daily -SSI  HLD (hyperlipidemia) -Lipitor    DVT ppx: on coumadin Code Status: Full code Family Communication: Yes, patient's daughter and has been at bed side Disposition Plan:  Anticipate discharge back to previous home environment Consults called: None Admission status: Obs / tele         Date of Service 12/17/2018    [facility]   If 7PM-7AM, please contact night-coverage [email or web address] Password [facility] 12/17/2018, 6:17 AM

## 2018-12-17 NOTE — Progress Notes (Signed)
ANTICOAGULATION CONSULT NOTE - Initial Consult  Pharmacy Consult for IV heparin/Warfarin Indication: LV thrombus  No Known Allergies  Patient Measurements: Height: 5\' 4"  (162.6 cm) Weight: 105 lb 13.1 oz (48 kg) IBW/kg (Calculated) : 54.7 Heparin Dosing Weight: actual  Vital Signs: Temp: 98.2 F (36.8 C) (01/22 2008) Temp Source: Oral (01/22 2008) BP: 143/92 (01/23 0330) Pulse Rate: 120 (01/23 0330)  Labs: Recent Labs    12/16/18 2022 12/17/18 0030  HGB 14.0  --   HCT 44.1  --   PLT 201  --   APTT  --  28  LABPROT  --  18.3*  INR  --  1.54  CREATININE 0.68  --     Estimated Creatinine Clearance: 54.5 mL/min (by C-G formula based on SCr of 0.68 mg/dL).   Medical History: Past Medical History:  Diagnosis Date  . Acute combined systolic and diastolic HF (heart failure) ([place]) 10/16/2018  . Allergic rhinitis    Requires cetirizine, singulair, and fluticasone.  . Anxiety    Has been on Xanax since 2009. Uses it for stress, anxiety, and insomnia. No contract yet.  . Arthritis   . CHF (congestive heart failure) (HCC)    EF 35% after stroke, presumed ischemic  . Chronic pain    Has OA of knees B. No Xrays in echart. Not requiring narcotics.  . Colitis 12/2017  . Depression   . Diabetes mellitus    Type 2, non insulin dependent. Was dx'd prior to 2008.  Marland Kitchen Hyperglycemia   . Hyperlipemia   . Hypertension   . Sickle cell trait ([place])   . Stroke [facility]) 09/05/14   Dominant left MCA infarcts secondary to unknown embolic source     Medications:  Scheduled:  . aspirin  81 mg Oral Daily  . atorvastatin  20 mg Oral q1800  . furosemide  40 mg Oral Daily  . GLUCERNA  237 mL Oral Daily  . insulin aspart  0-9 Units Subcutaneous TID WC  . Insulin Glargine  5 Units Subcutaneous QHS  . losartan  12.5 mg Oral Daily  . metoprolol succinate  100 mg Oral BH-q7a  . metoprolol succinate  50 mg Oral QHS  . pantoprazole  40 mg Oral Q supper  . potassium chloride  40 mEq Oral Once   . sodium chloride flush  3 mL Intravenous Once  . sucralfate  1 g Oral TID WC & HS   Infusions:    Assessment: 61 yoF with hx of LV thrombus on chronic warfarin admitted with abdominal pain. INR= 1.54.  IV heparin and warfarin per Rx Baseline labs: H/H = 14/44.1, Plts = 201, aPtt =28  Goal of Therapy:  INR 2-3  HL = 0.3-0.7 Monitor platelets by anticoagulation protocol: Yes   Plan:  Start heparin drip at 800 units/hr Daily CBC/HL Check 1st HL in 6 hours Give 7.5 mg Warfarin x1 this am Daily PT/INR  Lawana Pai R 12/17/2018,4:05 AM

## 2018-12-17 NOTE — Care Management Obs Status (Signed)
[place] NOTIFICATION   Patient Details  Name: LORAN AUGUSTE MRN: [id number] Date of Birth: 1956/01/22   Medicare Observation Status Notification Given:  Yes    MahabirJuliann Pulse, RN 12/17/2018, 3:13 PM

## 2018-12-17 NOTE — Progress Notes (Signed)
I have seen and assessed patient and agree with Dr.Niu's assessment and plan.  Patient is a 63 year old female history of hypertension, hyperlipidemia, diabetes, gastroesophageal reflux disease, nonischemic cardiomyopathy EF 20 to 25%, LV thrombus following MI on Coumadin, history of stroke in 2015 presented to the ED with nausea vomiting abdominal pain inability to keep anything down.  Patient noted to be tachycardic.  Patient also noted to have a subtherapeutic INR.  Cardiac enzymes were cycled which came back elevated.  Cardiology consulted for further evaluation and management.  Patient has had an extensive work-up of her nausea vomiting abdominal pain in the past with no significant etiology including a EGD, gastric emptying study, CT abdomen and pelvis in November 2019.  Concerned that abdominal symptoms may be a cardiac equivalent.  Patient with some nausea and vomiting this morning however currently states she is hungry and asking for food.  Per RN patient unable to take care oral Toprol and some of her oral medications due to emesis.  Will change oral Toprol to IV Lopressor, change oral PPI to IV PPI for now.  Patient also noted to have QT prolongation overnight which is subsequently since resolved.  Cardiology consulted.  No charge.

## 2018-12-18 DIAGNOSIS — Z841 Family history of disorders of kidney and ureter: Secondary | ICD-10-CM | POA: Diagnosis not present

## 2018-12-18 DIAGNOSIS — Z681 Body mass index (BMI) 19 or less, adult: Secondary | ICD-10-CM | POA: Diagnosis not present

## 2018-12-18 DIAGNOSIS — R7989 Other specified abnormal findings of blood chemistry: Secondary | ICD-10-CM | POA: Diagnosis not present

## 2018-12-18 DIAGNOSIS — G9341 Metabolic encephalopathy: Secondary | ICD-10-CM | POA: Diagnosis not present

## 2018-12-18 DIAGNOSIS — F329 Major depressive disorder, single episode, unspecified: Secondary | ICD-10-CM | POA: Diagnosis present

## 2018-12-18 DIAGNOSIS — M170 Bilateral primary osteoarthritis of knee: Secondary | ICD-10-CM | POA: Diagnosis present

## 2018-12-18 DIAGNOSIS — E43 Unspecified severe protein-calorie malnutrition: Secondary | ICD-10-CM

## 2018-12-18 DIAGNOSIS — Z833 Family history of diabetes mellitus: Secondary | ICD-10-CM | POA: Diagnosis not present

## 2018-12-18 DIAGNOSIS — Z800 Family history of malignant neoplasm of digestive organs: Secondary | ICD-10-CM | POA: Diagnosis not present

## 2018-12-18 DIAGNOSIS — Z8041 Family history of malignant neoplasm of ovary: Secondary | ICD-10-CM | POA: Diagnosis not present

## 2018-12-18 DIAGNOSIS — Z832 Family history of diseases of the blood and blood-forming organs and certain disorders involving the immune mechanism: Secondary | ICD-10-CM | POA: Diagnosis not present

## 2018-12-18 DIAGNOSIS — I248 Other forms of acute ischemic heart disease: Secondary | ICD-10-CM | POA: Diagnosis not present

## 2018-12-18 DIAGNOSIS — I513 Intracardiac thrombosis, not elsewhere classified: Secondary | ICD-10-CM | POA: Diagnosis not present

## 2018-12-18 DIAGNOSIS — I236 Thrombosis of atrium, auricular appendage, and ventricle as current complications following acute myocardial infarction: Secondary | ICD-10-CM | POA: Diagnosis not present

## 2018-12-18 DIAGNOSIS — I10 Essential (primary) hypertension: Secondary | ICD-10-CM | POA: Diagnosis not present

## 2018-12-18 DIAGNOSIS — I69319 Unspecified symptoms and signs involving cognitive functions following cerebral infarction: Secondary | ICD-10-CM | POA: Diagnosis not present

## 2018-12-18 DIAGNOSIS — E876 Hypokalemia: Secondary | ICD-10-CM | POA: Diagnosis not present

## 2018-12-18 DIAGNOSIS — E785 Hyperlipidemia, unspecified: Secondary | ICD-10-CM | POA: Diagnosis not present

## 2018-12-18 DIAGNOSIS — E1169 Type 2 diabetes mellitus with other specified complication: Secondary | ICD-10-CM | POA: Diagnosis present

## 2018-12-18 DIAGNOSIS — R1084 Generalized abdominal pain: Secondary | ICD-10-CM | POA: Diagnosis not present

## 2018-12-18 DIAGNOSIS — I420 Dilated cardiomyopathy: Secondary | ICD-10-CM | POA: Diagnosis not present

## 2018-12-18 DIAGNOSIS — G8929 Other chronic pain: Secondary | ICD-10-CM | POA: Diagnosis present

## 2018-12-18 DIAGNOSIS — Z8249 Family history of ischemic heart disease and other diseases of the circulatory system: Secondary | ICD-10-CM | POA: Diagnosis not present

## 2018-12-18 DIAGNOSIS — I110 Hypertensive heart disease with heart failure: Secondary | ICD-10-CM | POA: Diagnosis not present

## 2018-12-18 DIAGNOSIS — I5042 Chronic combined systolic (congestive) and diastolic (congestive) heart failure: Secondary | ICD-10-CM | POA: Diagnosis not present

## 2018-12-18 DIAGNOSIS — F419 Anxiety disorder, unspecified: Secondary | ICD-10-CM | POA: Diagnosis present

## 2018-12-18 DIAGNOSIS — K5900 Constipation, unspecified: Secondary | ICD-10-CM | POA: Diagnosis not present

## 2018-12-18 DIAGNOSIS — I5022 Chronic systolic (congestive) heart failure: Secondary | ICD-10-CM | POA: Diagnosis not present

## 2018-12-18 LAB — GLUCOSE, CAPILLARY
Glucose-Capillary: 161 mg/dL — ABNORMAL HIGH (ref 70–99)
Glucose-Capillary: 153 mg/dL — ABNORMAL HIGH (ref 70–99)
Glucose-Capillary: 148 mg/dL — ABNORMAL HIGH (ref 70–99)
Glucose-Capillary: 170 mg/dL — ABNORMAL HIGH (ref 70–99)

## 2018-12-18 LAB — BASIC METABOLIC PANEL
Anion gap: 10 (ref 5–15)
BUN: 9 mg/dL (ref 8–23)
CALCIUM: 8.5 mg/dL — AB (ref 8.9–10.3)
CO2: 26 mmol/L (ref 22–32)
Chloride: 97 mmol/L — ABNORMAL LOW (ref 98–111)
Creatinine, Ser: 0.85 mg/dL (ref 0.44–1.00)
GFR calc Af Amer: >60 mL/min (ref >60)
GFR calc non Af Amer: >60 mL/min (ref >60)
Glucose, Bld: 180 mg/dL — ABNORMAL HIGH (ref 70–99)
Potassium: 3.8 mmol/L (ref 3.5–5.1)
Sodium: 133 mmol/L — ABNORMAL LOW (ref 135–145)

## 2018-12-18 LAB — CBC
HCT: 40.7 % (ref 36.0–46.0)
Hemoglobin: 12.7 g/dL (ref 12.0–15.0)
MCH: 21.6 pg — ABNORMAL LOW (ref 26.0–34.0)
MCHC: 31.2 g/dL (ref 30.0–36.0)
MCV: 69.3 fL — AB (ref 80.0–100.0)
PLATELETS: 181 10*3/uL (ref 150–400)
RBC: 5.87 MIL/uL — ABNORMAL HIGH (ref 3.87–5.11)
RDW: 17.9 % — ABNORMAL HIGH (ref 11.5–15.5)
WBC: 5 10*3/uL (ref 4.0–10.5)
nRBC: 0 % (ref 0.0–0.2)

## 2018-12-18 LAB — HEPARIN LEVEL (UNFRACTIONATED): Heparin Unfractionated: 0.63 IU/mL (ref 0.30–0.70)

## 2018-12-18 LAB — HEPATIC FUNCTION PANEL
ALT: 59 U/L — ABNORMAL HIGH (ref 0–44)
AST: 86 U/L — ABNORMAL HIGH (ref 15–41)
Albumin: 3.2 g/dL — ABNORMAL LOW (ref 3.5–5.0)
Alkaline Phosphatase: 56 U/L (ref 38–126)
BILIRUBIN INDIRECT: 0.8 mg/dL (ref 0.3–0.9)
Bilirubin, Direct: 0.2 mg/dL (ref 0.0–0.2)
Total Bilirubin: 1 mg/dL (ref 0.3–1.2)
Total Protein: 6.1 g/dL — ABNORMAL LOW (ref 6.5–8.1)

## 2018-12-18 LAB — PROTIME-INR
INR: 1.58
PROTHROMBIN TIME: 18.7 s — AB (ref 11.4–15.2)

## 2018-12-18 LAB — MAGNESIUM: Magnesium: 2.5 mg/dL — ABNORMAL HIGH (ref 1.7–2.4)

## 2018-12-18 MED ORDER — METOCLOPRAMIDE HCL 5 MG/ML IJ SOLN
5.0000 mg | Freq: Four times a day (QID) | INTRAMUSCULAR | Status: DC | PRN
  Administered 2018-12-19: 5 mg via INTRAVENOUS
  Filled 2018-12-18: qty 2

## 2018-12-18 MED ORDER — METOPROLOL TARTRATE 5 MG/5ML IV SOLN
7.5000 mg | Freq: Four times a day (QID) | INTRAVENOUS | Status: DC
  Administered 2018-12-18 – 2018-12-20 (×8): 7.5 mg via INTRAVENOUS
  Filled 2018-12-18 (×7): qty 10

## 2018-12-18 MED ORDER — BISACODYL 10 MG RE SUPP
10.0000 mg | Freq: Every day | RECTAL | Status: DC
  Administered 2018-12-19 – 2018-12-21 (×2): 10 mg via RECTAL
  Filled 2018-12-18 (×3): qty 1

## 2018-12-18 MED ORDER — PROMETHAZINE HCL 25 MG/ML IJ SOLN
12.5000 mg | Freq: Once | INTRAMUSCULAR | Status: AC
  Administered 2018-12-18: 12.5 mg via INTRAVENOUS
  Filled 2018-12-18: qty 1

## 2018-12-18 MED ORDER — METOCLOPRAMIDE HCL 5 MG PO TABS
5.0000 mg | ORAL_TABLET | Freq: Three times a day (TID) | ORAL | Status: DC
  Administered 2018-12-18 – 2018-12-21 (×13): 5 mg via ORAL
  Filled 2018-12-18 (×14): qty 1

## 2018-12-18 MED ORDER — WARFARIN SODIUM 5 MG PO TABS
7.5000 mg | ORAL_TABLET | Freq: Once | ORAL | Status: DC
  Filled 2018-12-18: qty 1

## 2018-12-18 MED ORDER — WARFARIN SODIUM 5 MG PO TABS
7.5000 mg | ORAL_TABLET | Freq: Once | ORAL | Status: AC
  Administered 2018-12-18: 7.5 mg via ORAL

## 2018-12-18 NOTE — Progress Notes (Signed)
Pt husband remains at bedside, has called out numerous times for Pts nausea and vomiting. Write and NT have not seen any emesis. Husband speaks for pt. Also requested reglan IV and stated that PO reglan does not work well for pt. Writer requested from provider and received order for IV reglan. Will continue to monitor.

## 2018-12-18 NOTE — Progress Notes (Signed)
Progress Note  Patient Name: Kristen Lozano Date of Encounter: 12/18/2018  Primary Cardiologist: Pixie Casino, MD    Subjective   63 year old female with a history of a nonischemic cardiomyopathy.  She is been having problems with nausea and vomiting over the past several months.  She has not been able to take the oral Toprol-XL and has been on low-dose IV metoprolol. She vomited this morning.  She thinks she is ready to eat but she did not sound so convincing.     Inpatient Medications    Scheduled Meds: . aspirin EC  81 mg Oral Daily  . atorvastatin  20 mg Oral q1800  . feeding supplement (GLUCERNA SHAKE)  237 mL Oral Daily  . furosemide  40 mg Oral Daily  . insulin aspart  0-9 Units Subcutaneous TID WC  . insulin glargine  5 Units Subcutaneous QHS  . losartan  12.5 mg Oral Daily  . metoprolol tartrate  7.5 mg Intravenous Q6H  . pantoprazole (PROTONIX) IV  40 mg Intravenous Q24H  . sodium chloride flush  3 mL Intravenous Once  . sucralfate  1 g Oral TID WC & HS  . warfarin  7.5 mg Oral Once  . Warfarin - Pharmacist Dosing Inpatient   Does not apply q1800   Continuous Infusions: . heparin 800 Units/hr (12/17/18 0526)   PRN Meds: acetaminophen, guaiFENesin, hydrALAZINE, metoCLOPramide, morphine injection, nitroGLYCERIN, ondansetron, zolpidem   Vital Signs    Vitals:   12/17/18 2031 12/18/18 0039 12/18/18 0435 12/18/18 0529  BP: 116/84 94/74 98/72    Pulse: (!) 109 99 (!) 108   Resp: 18 16 20    Temp: 98.7 F (37.1 C) 98.3 F (36.8 C) 97.7 F (36.5 C)   TempSrc: Oral Oral Oral   SpO2: 98% 100% 100%   Weight:    48.3 kg  Height:        Intake/Output Summary (Last 24 hours) at 12/18/2018 1020 Last data filed at 12/18/2018 0600 Gross per 24 hour  Intake 477.89 ml  Output 1050 ml  Net -572.11 ml   Last 3 Weights 12/18/2018 12/17/2018 12/16/2018  Weight (lbs) 106 lb 7.7 oz 102 lb 15.3 oz 105 lb 13.1 oz  Weight (kg) 48.3 kg 46.7 kg 48 kg      Telemetry    Sinus tach  - Personally Reviewed  ECG     sinus tach  - Personally Reviewed  Physical Exam   GEN:  Chronically ill-appearing female, no acute distress Neck: No JVD Cardiac: RRR, tachycardic.  S3 gallop Respiratory: Clear to auscultation bilaterally. GI: Soft, nontender, non-distended  MS:  Very thin.  No edema. Neuro:  Nonfocal  Psych: Normal affect   Labs    Chemistry Recent Labs  Lab 12/16/18 2022 12/17/18 0955 12/18/18 0438  NA 137 137 133*  K 3.3* 3.3* 3.8  CL 99 97* 97*  CO2 27 28 26   GLUCOSE 170* 168* 180*  BUN 12 9 9   CREATININE 0.68 0.72 0.85  CALCIUM 8.6* 8.8* 8.5*  PROT 6.8 7.4  --   ALBUMIN 3.7 3.9  --   AST 32 38  --   ALT 25 24  --   ALKPHOS 48 50  --   BILITOT 1.7* 1.8*  --   GFRNONAA >60 >60 >60  GFRAA >60 >60 >60  ANIONGAP 11 12 10      Hematology Recent Labs  Lab 12/16/18 2022 12/17/18 0955 12/18/18 0438  WBC 10.3 8.5 5.0  RBC 6.35* 7.28* 5.87*  HGB 14.0 15.5* 12.7  HCT 44.1 50.4* 40.7  MCV 69.4* 69.2* 69.3*  MCH 22.0* 21.3* 21.6*  MCHC 31.7 30.8 31.2  RDW 18.2* 19.3* 17.9*  PLT 201 203 181    Cardiac Enzymes Recent Labs  Lab 12/17/18 0521 12/17/18 0955 12/17/18 1534  TROPONINI 1.15* 1.30* 1.27*    Recent Labs  Lab 12/17/18 0001 12/17/18 0323  TROPIPOC 0.54* 0.74*     BNP Recent Labs  Lab 12/16/18 2350  BNP 1,200.8*     DDimer No results for input(s): DDIMER in the last 168 hours.   Radiology    Ct Head Wo Contrast  Result Date: 12/17/2018 CLINICAL DATA:  63 year old female with altered mental status. EXAM: CT HEAD WITHOUT CONTRAST TECHNIQUE: Contiguous axial images were obtained from the base of the skull through the vertex without intravenous contrast. COMPARISON:  Head CT dated 10/25/2018 and MRI dated 10/26/2018 FINDINGS: Brain: The ventricles and sulci appropriate size for patient's age. There is mild chronic microvascular ischemic changes. Bilateral MCA territory old infarcts as seen on the prior CT.  There is no acute intracranial hemorrhage. No mass effect or midline shift. No extra-axial fluid collection. Vascular: No hyperdense vessel or unexpected calcification. Skull: Normal. Negative for fracture or focal lesion. Sinuses/Orbits: No acute finding. Other: None IMPRESSION: 1. No acute intracranial hemorrhage. 2. 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 Electronically Signed   By: Anner Crete M.D.   On: 12/17/2018 00:23   Ct Venogram Head  Result Date: 12/17/2018 CLINICAL DATA:  Altered mental status. Assess for dural venous sinus thrombosis. History of infarcts, sickle cell trait. EXAM: CT VENOGRAM HEAD TECHNIQUE: CT HEAD December 17, 2018. CONTRAST:  11m OMNIPAQUE IOHEXOL 300 MG/ML  SOLN COMPARISON:  CT HEAD December 17, 2018. FINDINGS: Normal contrast opacification of the superior sagittal sinus, torcula of the Herophili, bilateral transverse, sigmoid sinuses and included internal jugular veins. No suspicious filling defects or focal stenoses. Normal appearance of the internal cerebral veins. IMPRESSION: No dural venous sinus thrombosis. Electronically Signed   By: CElon AlasM.D.   On: 12/17/2018 03:06    Cardiac Studies     Patient Profile     63y.o. female with an idiopathic dilated cardiomyopathy. She is been unable to take all of her medications for several months. Because of nausea and vomiting.   Assessment & Plan    1.  Idiopathic dilated cardiomyopathy: Is been very difficult to treat her because she cannot/will not take her medications.  She is had lots of nausea and vomiting and cannot keep her medicines down. I have put the Lasix and potassium on hold for now since she is eating well and has very limited p.o. intake. I would restart Toprol-XL, losartan, and furosemide what she is taking p.o. well. We will continue heparin drip since she is not able to hold her Coumadin down.  Effective treatment of her  idiopathic  cardiomyopathy will depend on her being able to take her oral medications. In addition, it appears that she is developing malnutrition. I Wonder if there is a psychological component to her nausea and vomiting.  2.  Left ventricular thrombus: The patient has been on Coumadin.  She is currently on heparin because she is not able to eat.   3.  Sinus tachycardia: Continue IV metoprolol as needed for tachycardia.  Long-term, she needs to take long-acting metoprolol. Not been able to take the medication for quite some time.    4.  Troponin  elevation: Her troponin elevation is most likely due to demand ischemia in the setting her of her severe LV dysfunction and tachycardia. She is very weak and is unable to eat.  She is not a good candidate for invasive procedures at this point. Worsening, she is not having any episodes of chest pain.  She may be a candidate for heart catheterization once she is eating better and once she gets stronger.  For questions or updates, please contact [place] Please consult [email or web address] for contact info under        Signed, Mertie Moores, MD  12/18/2018, 10:20 AM

## 2018-12-18 NOTE — Progress Notes (Signed)
ANTICOAGULATION CONSULT NOTE - Follow Up  Pharmacy Consult for IV heparin/Warfarin Indication: LV thrombus  No Known Allergies  Patient Measurements: Height: 5\' 3"  (160 cm) Weight: 106 lb 7.7 oz (48.3 kg) IBW/kg (Calculated) : 52.4 Heparin Dosing Weight: actual  Vital Signs: Temp: 97.7 F (36.5 C) (01/24 0435) Temp Source: Oral (01/24 0435) BP: 98/72 (01/24 0435) Pulse Rate: 108 (01/24 0435)  Labs: Recent Labs    12/16/18 2022 12/17/18 0030 12/17/18 0521 12/17/18 0955 12/17/18 1534 12/18/18 0438  HGB 14.0  --   --  15.5*  --  12.7  HCT 44.1  --   --  50.4*  --  40.7  PLT 201  --   --  203  --  181  APTT  --  28  --   --   --   --   LABPROT  --  18.3*  --   --   --  18.7*  INR  --  1.54  --   --   --  1.58  HEPARINUNFRC  --   --   --  0.46 0.58 0.63  CREATININE 0.68  --   --  0.72  --  0.85  TROPONINI  --   --  1.15* 1.30* 1.27*  --     Estimated Creatinine Clearance: 51.7 mL/min (by C-G formula based on SCr of 0.85 mg/dL).   Medical History: Past Medical History:  Diagnosis Date  . Acute combined systolic and diastolic HF (heart failure) ([place]) 10/16/2018  . Allergic rhinitis    Requires cetirizine, singulair, and fluticasone.  . Anxiety    Has been on Xanax since 2009. Uses it for stress, anxiety, and insomnia. No contract yet.  . Arthritis   . CHF (congestive heart failure) (HCC)    EF 35% after stroke, presumed ischemic  . Chronic pain    Has OA of knees B. No Xrays in echart. Not requiring narcotics.  . Colitis 12/2017  . Depression   . Diabetes mellitus    Type 2, non insulin dependent. Was dx'd prior to 2008.  [facility] Kitchen Hyperglycemia   . Hyperlipemia   . Hypertension   . Sickle cell trait ([place])   . 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 aspirin EC  81 mg Oral Daily  . atorvastatin  20 mg Oral q1800  . feeding supplement (GLUCERNA SHAKE)  237 mL Oral Daily  . furosemide   40 mg Oral Daily  . insulin aspart  0-9 Units Subcutaneous TID WC  . insulin glargine  5 Units Subcutaneous QHS  . losartan  12.5 mg Oral Daily  . metoprolol tartrate  5 mg Intravenous Q6H  . pantoprazole (PROTONIX) IV  40 mg Intravenous Q24H  . sodium chloride flush  3 mL Intravenous Once  . sucralfate  1 g Oral TID WC & HS  . Warfarin - Pharmacist Dosing Inpatient   Does not apply q1800   Infusions:  . heparin 800 Units/hr (12/17/18 0526)    Assessment: 53 yoF with hx of LV thrombus on chronic warfarin admitted with abdominal pain. Found to have elevated troponin, Cardiology consulted, no cath at this time. INR= 1.54.  IV heparin and warfarin per Rx.  Home warfarin dose 5mg  daily except 7.5mg  Mon & Fri  Today. 12/18/18  Heparin level drawn heparin level therapeutic 0.63 on 800 units/hr  INR subtherpeutic 1.58  CBC: wnl but decreased to 12.7,  Plts wnl  No reported bleeding  Goal of Therapy:  INR 2-3  HL = 0.3-0.7 Monitor platelets by anticoagulation protocol: Yes   Plan:  1) Continue IV heparin drip at 800 units/hr 2) 7.5 mg Warfarin x1 to be given at 1000 today 3) Daily PT/INR, heparin level and CBC  Peggyann Juba, PharmD, BCPS Pager: [phone] 12/18/2018 6:59 AM

## 2018-12-18 NOTE — Progress Notes (Signed)
Pt with 3-4 episodes of clearish colored emesis. Pt has reglan po but pt requesting IV reglan or phenergan. Message sent to MD.

## 2018-12-18 NOTE — Progress Notes (Signed)
PROGRESS NOTE    Kristen Lozano  EPP:[id number] DOB: 02-22-1956 DOA: 12/16/2018 PCP: Audley Hose, MD    Brief Narrative:  Per Dr. [name] is a 63 y.o. female with medical history significant of hypertension, hyperlipidemia, diabetes mellitus, stroke, GERD, CHF with EF of 20%, colitis, sickle cell trait, left ventricle mural thrombus on Coumadin, GERD, depression, anxiety, chronic nausea, vomiting abdominal pain, who presents with nausea, vomiting and abdominal pain.  Patient has chronic nausea, vomiting, abdominal pain.  Has multiple ED visit and recent admission from 12/28-12/31.  Per discharge summary, "work-up done in the past including EGD and gastric emptying study without clear diagnosis. CT of the abdomen was done in November showed some liver congestion. KUB without any stool burden".  Per her daughter, patient continues to have nausea, vomiting and abdominal pain intermittently.  She has vomited several times with nonbilious, nonbloody vomitus.  Her abdominal pain is located in the central abdomen, intermittent, sharp, nonradiating.  Patient does not have diarrhea.  Patient has mild cough.  She is drowsy and has some mild confusion, but is still orientated x3 when I saw patient in ED. She is not sure whether she has any chest pain.  No unilateral weakness.  No facial droop or slurred speech.  Denies symptoms of UTI.  ED Course: pt was found to have WBC 10.3, BNP 1200, INR 1.54, PTT 28, troponin 0.57-->0.74, lipase 43, negative urinalysis, potassium 3.3, creatinine and BUN normal, ammonia 13, temperature normal, heart rate 42- 116, no tachypnea, oxygen saturation 97% on room air.  CT head is negative for acute intracranial abnormalities, but showed bilateral MCA infarction.  MR venogram is negative.  Patient is placed on telemetry bed for observation.  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  This is a chronic issue.  Patient had extensive work-up in the past without clear diagnosis made.  Patient has had a HIDA scan which was unremarkable, CT abdomen and pelvis which was unremarkable, upper endoscopy which was unremarkable, right upper quadrant ultrasound.  Repeat LFTs. Lipase normal on admission.  Patient still with some abdominal pain some nausea and some emesis.  Patient with a bout of emesis around 3 AM this morning.  Patient was on Reglan 3 times a day as needed at home and husband states he was given it to her on a regular basis.  Will change Reglan to 5 mg before meals and at bedtime.  Check abdominal films.  We will give a soapsuds enema.  Place on scheduled Dulcolax suppositories.  Pain management.  Follow.  If patient with continued nausea vomiting abdominal pain and inability to keep down anything patient likely with a poor prognosis.  May need GI to reassess versus palliative care evaluation.  Idiopathic dilated cardiomyopathy Patient noted to have idiopathic dilated cardiomyopathy.  2D echo with a EF of 20 to 25% with diffuse hypokinesis and ventricular septal abnormal function with dyssynergy unchanged from prior 2D echo.  Patient has been seen by cardiology and is noted per their note that patient's  dilated cardiomyopathy difficult to treat as patient unable to take her medications due to loss of nausea and vomiting and cannot keep her medications down.  Patient currently on IV Lopressor and oral Cozaar and Lasix.  Cardiology recommending to resume oral Toprol-XL, losartan and furosemide when  patient is taking p.o. well.  Patient on IV heparin with oral Coumadin but not sure as to whether patient is keeping Coumadin down.  Elevated troponin: Troponin 0.57-->0.74 >>> 1.15>>>> 1.30>>> 1.27. Patient denies any chest pain. Patient on IV heparin.  Fasting lipid panel with LDL of 33.  2D echo with EF of 20 to 25%, diffuse hypokinesis, trivial aortic valvular regurgitation, ventricular septum with abnormal function dyssynergy, mildly dilated left atrium, no significant change from prior 2D echo.  Patient presented with nausea emesis and abdominal pain which has been ongoing for several months.  Patient likely unable to keep most of her medications down.  Patient seen in consultation by cardiology who feel patient's elevated troponin most likely due to demand ischemia in the setting of his severe LV dysfunction and tachycardia.  Patient noted not to be a good candidate for invasive procedures at this point.  Per cardiology patient may be a candidate for heart catheterization when she is eating better, stronger and tolerating cardiac medications.  Continue IV Lopressor.  Continue Cozaar patient able to tolerate, statin and aspirin.  Cardiology following and appreciate input and recommendations.  LV (left ventricular) mural thrombus following MI: on coumadin, but INR=1.54 -Patient on Coumadin.  INR subtherapeutic at 1.58.  Patient on IV heparin and Coumadin per pharmacy.  Patient with ongoing nausea and emesis and abdominal pain.  Wonder if patient is truly absorbing Coumadin however will follow.  Acute metabolic encephalopathy: pt is still oriented x3.  CT head is negative for acute intracranial abnormalities MR venogram is negative.    Patient likely close to baseline as patient with prior history of CVA.  Follow.    HTN:  -Oral Toprol was changed to IV Lopressor and dose increased to 7.5 mg every 6 hours as patient noted to have nausea and emesis with poor oral intake.  Continue oral Cozaar if  able to tolerate.  Once patient's nausea and vomiting has improved we will change IV Lopressor to home dose oral Toprol.   Stroke [facility]): -on ASA, and lipitor if able to keep medications down with improvement of nausea and vomiting.  Chronic combined systolic and diastolic CHF (congestive heart failure) ([place]): BNP 1200, patient clinically looks euvolemic on examination.  No JVD.  Patient with no signs of crackles on examination and no signs of pulmonary edema.  Patient was given a dose of IV Lasix on admission.  Patient with poor oral intake.  Urine output of 1.925 L over the past 24 hours.  Patient with poor oral intake but patient has trace leg edema, but no JVD.  No acute respiratory distress.  Patient with nausea vomiting poor oral intake.  Continue home dose oral Lasix, Lipitor, aspirin, IV Lopressor, Cozaar.  Cardiology following.  Hypokalemia and hypomagnesemia: K= 3.3 and Mg 1.6  on admission. -Potassium now at 3.8.  Magnesium at 2.5.  Follow.  Type 2 diabetes mellitus with vascular disease ([place]): Last A1c 8.3 on 11/22/18, poorly controled. Patient is taking metformin, Lantus at home.  Patient with nausea and emesis and poor oral intake at this time.  CBG of 161 this morning.  Continue sliding scale insulin and Lantus 5 units daily.  Follow.  HLD (hyperlipidemia) -Continue Lipitor  if able to tolerate oral medications.  Severe protein calorie malnutrition Likely secondary to poor oral intake due to ongoing abdominal pain nausea and vomiting and inability to keep anything down.   DVT prophylaxis: Heparin Code Status: Full Family Communication: Updated husband at bedside. Disposition Plan: Likely home once tolerating oral intake, with clinical improvement.   Consultants:   Cardiology: Dr. Acie Fredrickson 12/17/2018  Procedures:   2D echo 12/17/2018  CT head without contrast 12/17/2018  Antimicrobials:   None   Subjective: Patient sleeping however easily arousable.  Patient with  some occasional bouts of confusion which per husband is at baseline.  No chest pain.  No shortness of breath.  Patient noted to have a bout of emesis around 3 AM this morning per nursing.  Patient on clears.  Patient unable to keep food down and eating less than 25% of her meals.  Objective: Vitals:   12/18/18 0435 12/18/18 0529 12/18/18 1316 12/18/18 1349  BP: 98/72  130/81 129/78  Pulse: (!) 108  (!) 117 (!) 114  Resp: 20  19   Temp: 97.7 F (36.5 C)  99.1 F (37.3 C) 98.8 F (37.1 C)  TempSrc: Oral  Oral Oral  SpO2: 100%  99%   Weight:  48.3 kg    Height:        Intake/Output Summary (Last 24 hours) at 12/18/2018 1708 Last data filed at 12/18/2018 1500 Gross per 24 hour  Intake 324.38 ml  Output 650 ml  Net -325.62 ml   Filed Weights   12/16/18 2011 12/17/18 0448 12/18/18 0529  Weight: 48 kg 46.7 kg 48.3 kg    Examination:  General exam: NAD.  Frail.  Cachectic.  Chronically ill-appearing. Respiratory system: Clear to auscultation. Respiratory effort normal. Cardiovascular system: Tachycardic.  S3 gallop.  No lower extremity edema.  No JVD.   Gastrointestinal system: Abdomen is nondistended, soft and nontender. No organomegaly or masses felt. Normal bowel sounds heard. Central nervous system: Alert and oriented. No focal neurological deficits. Extremities: Symmetric 5 x 5 power. Skin: No rashes, lesions or ulcers Psychiatry: Judgement and insight appear normal. Mood & affect appropriate.     Data Reviewed: I have personally reviewed following labs and imaging studies  CBC: Recent Labs  Lab 12/16/18 2022 12/17/18 0955 12/18/18 0438  WBC 10.3 8.5 5.0  NEUTROABS  --  6.7  --   HGB 14.0 15.5* 12.7  HCT 44.1 50.4* 40.7  MCV 69.4* 69.2* 69.3*  PLT 201 203 401   Basic Metabolic Panel: Recent Labs  Lab 12/16/18 2022 12/17/18 0521 12/17/18 0955 12/18/18 0438  NA 137  --  137 133*  K 3.3*  --  3.3* 3.8  CL 99  --  97* 97*  CO2 27  --  28 26  GLUCOSE 170*   --  168* 180*  BUN 12  --  9 9  CREATININE 0.68  --  0.72 0.85  CALCIUM 8.6*  --  8.8* 8.5*  MG  --  1.6*  --  2.5*   GFR: Estimated Creatinine Clearance: 51.7 mL/min (by C-G formula based on SCr of 0.85 mg/dL). Liver Function Tests: Recent Labs  Lab 12/16/18 2022 12/17/18 0955 12/18/18 0438  AST 32 38 86*  ALT 25 24 59*  ALKPHOS 48 50 56  BILITOT 1.7* 1.8* 1.0  PROT 6.8 7.4 6.1*  ALBUMIN 3.7 3.9 3.2*   Recent Labs  Lab 12/16/18 2022  LIPASE 43   Recent Labs  Lab 12/16/18 2350  AMMONIA 13  Coagulation Profile: Recent Labs  Lab 12/17/18 0030 12/18/18 0438  INR 1.54 1.58   Cardiac Enzymes: Recent Labs  Lab 12/17/18 0521 12/17/18 0955 12/17/18 1534  TROPONINI 1.15* 1.30* 1.27*   BNP (last 3 results) No results for input(s): PROBNP in the last 8760 hours. HbA1C: Recent Labs    12/17/18 0521  HGBA1C 8.6*   CBG: Recent Labs  Lab 12/17/18 1653 12/17/18 2030 12/18/18 0829 12/18/18 1145 12/18/18 1634  GLUCAP 181* 248* 161* 153* 148*   Lipid Profile: Recent Labs    12/17/18 0521  CHOL 89  HDL 47  LDLCALC 33  TRIG 43  CHOLHDL 1.9   Thyroid Function Tests: No results for input(s): TSH, T4TOTAL, FREET4, T3FREE, THYROIDAB in the last 72 hours. Anemia Panel: No results for input(s): VITAMINB12, FOLATE, FERRITIN, TIBC, IRON, RETICCTPCT in the last 72 hours. Sepsis Labs: No results for input(s): PROCALCITON, LATICACIDVEN in the last 168 hours.  No results found for this or any previous visit (from the past 240 hour(s)).       Radiology Studies: Ct Head Wo Contrast  Result Date: 12/17/2018 CLINICAL DATA:  63 year old female with altered mental status. EXAM: CT HEAD WITHOUT CONTRAST TECHNIQUE: Contiguous axial images were obtained from the base of the skull through the vertex without intravenous contrast. COMPARISON:  Head CT dated 10/25/2018 and MRI dated 10/26/2018 FINDINGS: Brain: The ventricles and sulci appropriate size for patient's age.  There is mild chronic microvascular ischemic changes. Bilateral MCA territory old infarcts as seen on the prior CT. There is no acute intracranial hemorrhage. No mass effect or midline shift. No extra-axial fluid collection. Vascular: No hyperdense vessel or unexpected calcification. Skull: Normal. Negative for fracture or focal lesion. Sinuses/Orbits: No acute finding. Other: None IMPRESSION: 1. No acute intracranial hemorrhage. 2. 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 Electronically Signed   By: Anner Crete M.D.   On: 12/17/2018 00:23   Ct Venogram Head  Result Date: 12/17/2018 CLINICAL DATA:  Altered mental status. Assess for dural venous sinus thrombosis. History of infarcts, sickle cell trait. EXAM: CT VENOGRAM HEAD TECHNIQUE: CT HEAD December 17, 2018. CONTRAST:  76m OMNIPAQUE IOHEXOL 300 MG/ML  SOLN COMPARISON:  CT HEAD December 17, 2018. FINDINGS: Normal contrast opacification of the superior sagittal sinus, torcula of the Herophili, bilateral transverse, sigmoid sinuses and included internal jugular veins. No suspicious filling defects or focal stenoses. Normal appearance of the internal cerebral veins. IMPRESSION: No dural venous sinus thrombosis. Electronically Signed   By: CElon AlasM.D.   On: 12/17/2018 03:06        Scheduled Meds: . aspirin EC  81 mg Oral Daily  . atorvastatin  20 mg Oral q1800  . feeding supplement (GLUCERNA SHAKE)  237 mL Oral Daily  . furosemide  40 mg Oral Daily  . insulin aspart  0-9 Units Subcutaneous TID WC  . insulin glargine  5 Units Subcutaneous QHS  . losartan  12.5 mg Oral Daily  . metoCLOPramide  5 mg Oral TID AC & HS  . metoprolol tartrate  7.5 mg Intravenous Q6H  . pantoprazole (PROTONIX) IV  40 mg Intravenous Q24H  . sodium chloride flush  3 mL Intravenous Once  . sucralfate  1 g Oral TID WC & HS  . Warfarin - Pharmacist Dosing Inpatient   Does not apply q1800   Continuous  Infusions: . heparin 800 Units/hr (12/18/18 1254)     LOS: 0 days    Time spent: 35 minutes  Irine Seal, MD [facility]  If 7PM-7AM, please contact night-coverage [email or web address] 12/18/2018, 5:08 PM

## 2018-12-18 NOTE — Progress Notes (Signed)
Pt with pink colored emesis after taking carafate. Zofran 4mg  IV given per pt request.

## 2018-12-18 NOTE — Progress Notes (Signed)
Pt refused the advancement of enema tubing. Attempt to administer Soap suds enema was unsuccessful.

## 2018-12-19 DIAGNOSIS — I5022 Chronic systolic (congestive) heart failure: Secondary | ICD-10-CM

## 2018-12-19 LAB — COMPREHENSIVE METABOLIC PANEL
ALT: 51 U/L — ABNORMAL HIGH (ref 0–44)
AST: 56 U/L — AB (ref 15–41)
Albumin: 3.4 g/dL — ABNORMAL LOW (ref 3.5–5.0)
Alkaline Phosphatase: 56 U/L (ref 38–126)
Anion gap: 10 (ref 5–15)
BUN: 10 mg/dL (ref 8–23)
CHLORIDE: 101 mmol/L (ref 98–111)
CO2: 24 mmol/L (ref 22–32)
Calcium: 8.6 mg/dL — ABNORMAL LOW (ref 8.9–10.3)
Creatinine, Ser: 0.8 mg/dL (ref 0.44–1.00)
GFR calc Af Amer: >60 mL/min (ref >60)
Glucose, Bld: 150 mg/dL — ABNORMAL HIGH (ref 70–99)
Potassium: 3.4 mmol/L — ABNORMAL LOW (ref 3.5–5.1)
Sodium: 135 mmol/L (ref 135–145)
Total Bilirubin: 1.4 mg/dL — ABNORMAL HIGH (ref 0.3–1.2)
Total Protein: 6.4 g/dL — ABNORMAL LOW (ref 6.5–8.1)

## 2018-12-19 LAB — CBC
HCT: 40.9 % (ref 36.0–46.0)
Hemoglobin: 13 g/dL (ref 12.0–15.0)
MCH: 21.8 pg — ABNORMAL LOW (ref 26.0–34.0)
MCHC: 31.8 g/dL (ref 30.0–36.0)
MCV: 68.6 fL — ABNORMAL LOW (ref 80.0–100.0)
Platelets: 182 10*3/uL (ref 150–400)
RBC: 5.96 MIL/uL — ABNORMAL HIGH (ref 3.87–5.11)
RDW: 17.7 % — ABNORMAL HIGH (ref 11.5–15.5)
WBC: 7.2 10*3/uL (ref 4.0–10.5)
nRBC: 0 % (ref 0.0–0.2)

## 2018-12-19 LAB — GLUCOSE, CAPILLARY
GLUCOSE-CAPILLARY: 200 mg/dL — AB (ref 70–99)
Glucose-Capillary: 119 mg/dL — ABNORMAL HIGH (ref 70–99)
Glucose-Capillary: 229 mg/dL — ABNORMAL HIGH (ref 70–99)
Glucose-Capillary: 180 mg/dL — ABNORMAL HIGH (ref 70–99)

## 2018-12-19 LAB — PROTIME-INR
INR: 2.41
PROTHROMBIN TIME: 25.9 s — AB (ref 11.4–15.2)

## 2018-12-19 LAB — MAGNESIUM: Magnesium: 1.8 mg/dL (ref 1.7–2.4)

## 2018-12-19 LAB — HEPARIN LEVEL (UNFRACTIONATED): HEPARIN UNFRACTIONATED: 0.8 [IU]/mL — AB (ref 0.30–0.70)

## 2018-12-19 MED ORDER — POTASSIUM CHLORIDE CRYS ER 20 MEQ PO TBCR
40.0000 meq | EXTENDED_RELEASE_TABLET | Freq: Once | ORAL | Status: AC
  Administered 2018-12-19: 40 meq via ORAL
  Filled 2018-12-19: qty 2

## 2018-12-19 MED ORDER — POTASSIUM CHLORIDE 20 MEQ PO PACK
40.0000 meq | PACK | Freq: Once | ORAL | Status: DC
  Filled 2018-12-19: qty 2

## 2018-12-19 MED ORDER — FUROSEMIDE 10 MG/ML IJ SOLN
40.0000 mg | Freq: Once | INTRAMUSCULAR | Status: AC
  Administered 2018-12-19: 40 mg via INTRAVENOUS
  Filled 2018-12-19: qty 4

## 2018-12-19 MED ORDER — WARFARIN SODIUM 4 MG PO TABS
4.0000 mg | ORAL_TABLET | Freq: Once | ORAL | Status: AC
  Administered 2018-12-19: 4 mg via ORAL
  Filled 2018-12-19: qty 1

## 2018-12-19 NOTE — Progress Notes (Signed)
PROGRESS NOTE    Kristen Lozano  GHW:[id number] DOB: 09/24/1956 DOA: 12/16/2018 PCP: Audley Hose, MD    Brief Narrative:  Per Dr. [name] is a 63 y.o. female with medical history significant of hypertension, hyperlipidemia, diabetes mellitus, stroke, GERD, CHF with EF of 20%, colitis, sickle cell trait, left ventricle mural thrombus on Coumadin, GERD, depression, anxiety, chronic nausea, vomiting abdominal pain, who presents with nausea, vomiting and abdominal pain.  Patient has chronic nausea, vomiting, abdominal pain.  Has multiple ED visit and recent admission from 12/28-12/31.  Per discharge summary, "work-up done in the past including EGD and gastric emptying study without clear diagnosis. CT of the abdomen was done in November showed some liver congestion. KUB without any stool burden".  Per her daughter, patient continues to have nausea, vomiting and abdominal pain intermittently.  She has vomited several times with nonbilious, nonbloody vomitus.  Her abdominal pain is located in the central abdomen, intermittent, sharp, nonradiating.  Patient does not have diarrhea.  Patient has mild cough.  She is drowsy and has some mild confusion, but is still orientated x3 when I saw patient in ED. She is not sure whether she has any chest pain.  No unilateral weakness.  No facial droop or slurred speech.  Denies symptoms of UTI.  ED Course: pt was found to have WBC 10.3, BNP 1200, INR 1.54, PTT 28, troponin 0.57-->0.74, lipase 43, negative urinalysis, potassium 3.3, creatinine and BUN normal, ammonia 13, temperature normal, heart rate 42- 116, no tachypnea, oxygen saturation 97% on room air.  CT head is negative for acute intracranial abnormalities, but showed bilateral MCA infarction.  MR venogram is negative.  Patient is placed on telemetry bed for observation.  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  This is a chronic issue.  Patient had extensive work-up in the past without clear diagnosis made.  Patient has had a HIDA scan which was unremarkable, CT abdomen and pelvis which was unremarkable, upper endoscopy which was unremarkable, right upper quadrant ultrasound.  Repeat LFTs. Lipase normal on admission.  Patient still with some abdominal pain some nausea and some emesis.  Patient with a bout of emesis around 3 AM this morning.  Patient was on Reglan 3 times a day as needed at home and husband states he was given it to her on a regular basis.  Reglan has been changed to 5 mg before meals and at bedtime.  Soapsuds enema ordered however patient unable to tolerate.  Patient placed on scheduled Dulcolax suppositories with small bowel movement this morning.  Continue pain management. If patient with continued nausea vomiting abdominal pain and inability to keep down anything patient likely with a poor prognosis.  May need GI to reassess versus palliative care evaluation.  Idiopathic dilated cardiomyopathy Patient noted to have idiopathic dilated cardiomyopathy.  2D echo with a EF of 20 to 25% with diffuse hypokinesis and ventricular septal abnormal function with dyssynergy unchanged from prior 2D echo.  Patient has been seen by cardiology and is noted per  their note that patient's dilated cardiomyopathy difficult to treat as patient unable to take her medications due to loss of nausea and vomiting and cannot keep her medications down.  Patient currently on IV Lopressor and oral Cozaar and Lasix.  Cardiology  recommending to resume oral Toprol-XL, losartan and furosemide when patient is taking p.o. well.  Patient on IV heparin with oral Coumadin but not sure as to whether patient is keeping Coumadin down.  INR this morning however therapeutic and as such patient may be keeping Coumadin down.  Elevated troponin: Troponin 0.57-->0.74 >>> 1.15>>>> 1.30>>> 1.27. Patient denies any chest pain. Patient on IV heparin.  Fasting lipid panel with LDL of 33.  2D echo with EF of 20 to 25%, diffuse hypokinesis, trivial aortic valvular regurgitation, ventricular septum with abnormal function dyssynergy, mildly dilated left atrium, no significant change from prior 2D echo.  Patient presented with nausea emesis and abdominal pain which has been ongoing for several months.  Patient likely unable to keep most of her medications down.  Patient seen in consultation by cardiology who feel patient's elevated troponin most likely due to demand ischemia in the setting of his severe LV dysfunction and tachycardia.  Patient noted not to be a good candidate for invasive procedures at this point.  Per cardiology patient may be a candidate for heart catheterization when she is eating better, stronger and tolerating cardiac medications.  Continue IV Lopressor.  Continue Cozaar if patient able to tolerate, statin and aspirin.  Cardiology following and appreciate input and recommendations.  LV (left ventricular) mural thrombus following MI: on coumadin, but INR= 2.41 -Patient on Coumadin.  INR now therapeutic.  IV heparin has been discontinued per pharmacy.  Coumadin per pharmacy. Patient with ongoing nausea and emesis and abdominal pain.   Acute metabolic encephalopathy: pt is still oriented x3.  CT head is negative for acute intracranial abnormalities MR venogram is negative.    Patient likely close to baseline as patient with prior history of CVA and per husband patient with some bouts of confusion which is chronic.  Follow.    HTN:    -Oral Toprol was changed to IV Lopressor and dose increased to 7.5 mg every 6 hours as patient noted to have nausea and emesis with poor oral intake.  Continue oral Cozaar if able to tolerate.  Once patient's nausea and vomiting has improved we will change IV Lopressor to home dose oral Toprol.   Stroke [facility]): -on ASA, and lipitor if able to keep medications down with improvement of nausea and vomiting.  Chronic combined systolic and diastolic CHF (congestive heart failure) ([place]): BNP 1200, patient clinically looks euvolemic on examination on admission.  Patient with positive JVD.  No lower extremity edema.  No crackles noted on examination. Patient was given a dose of IV Lasix on admission.  Patient with poor oral intake.  Urine output over the past 24 hours not properly recorded. No acute respiratory distress.  Patient with nausea vomiting poor oral intake.  Continue home dose Lipitor, aspirin, IV Lopressor, Cozaar.  Patient given a dose of IV Lasix per cardiology this morning.  Cardiology following.  Hypokalemia and hypomagnesemia: K= 3.3 and Mg 1.6  on admission. -Potassium now at 3.4.  Magnesium at 1.8.  K. Dur 40 mEq p.o. x1.  Follow.  Type 2 diabetes mellitus with vascular disease ([place]): Last A1c 8.3 on 11/22/18, poorly controled. Patient is taking metformin, Lantus at home.  Patient with nausea and emesis and poor oral intake at  this time.  CBG of 119 this morning.  Continue sliding scale insulin and Lantus 5 units daily.  Follow.  HLD (hyperlipidemia) -Continue Lipitor  if able to tolerate oral medications.  Severe protein calorie malnutrition Likely secondary to poor oral intake due to ongoing abdominal pain nausea and vomiting and inability to keep anything down.  Per nurse tech patient able to tolerate some pudding/full liquids this morning.  Patient on scheduled Reglan before meals and at bedtime.  Advance diet to a soft diet.   DVT prophylaxis: Heparin Code Status:  Full Family Communication: No family at bedside.  Disposition Plan: Likely home once tolerating oral intake, with clinical improvement.   Consultants:   Cardiology: Dr. Acie Fredrickson 12/17/2018  Procedures:   2D echo 12/17/2018  CT head without contrast 12/17/2018  Antimicrobials:   None   Subjective: Patient laying in bed easily arousable.  Denies any emesis this morning.  Patient states she ate some eggs this morning was able to keep it down however, Per nurse tech patient was able to tolerate some pudding this morning.  Patient denies any chest pain.  Patient denies any abdominal pain.  Patient given enema yesterday however had to be stopped as patient was complaining of pain.  Per nurse tech patient with small bowel movement this morning.  Objective: Vitals:   12/18/18 1316 12/18/18 1349 12/18/18 2029 12/19/18 0626  BP: 130/81 129/78 111/78 129/83  Pulse: (!) 117 (!) 114 100 (!) 54  Resp: 19  18 18   Temp: 99.1 F (37.3 C) 98.8 F (37.1 C) 98.6 F (37 C) 99.2 F (37.3 C)  TempSrc: Oral Oral Oral Oral  SpO2: 99%  99% 95%  Weight:      Height:        Intake/Output Summary (Last 24 hours) at 12/19/2018 1143 Last data filed at 12/19/2018 1024 Gross per 24 hour  Intake 448.41 ml  Output -  Net 448.41 ml   Filed Weights   12/16/18 2011 12/17/18 0448 12/18/18 0529  Weight: 48 kg 46.7 kg 48.3 kg    Examination:  General exam: NAD.  Frail.  Cachectic.  Chronically ill-appearing. Respiratory system: Lungs clear to auscultation bilaterally anterior lung fields.  Respiratory effort normal. Cardiovascular system: Tachycardic.  S3 gallop.  Positive JVD.  No lower extremity edema noted.  Gastrointestinal system: Abdomen is soft, nontender, nondistended, positive bowel sounds.  No rebound.  No guarding.  Central nervous system: Alert and oriented. No focal neurological deficits. Extremities: Symmetric 5 x 5 power. Skin: No rashes, lesions or ulcers Psychiatry: Judgement and  insight appear normal. Mood & affect appropriate.     Data Reviewed: I have personally reviewed following labs and imaging studies  CBC: Recent Labs  Lab 12/16/18 2022 12/17/18 0955 12/18/18 0438 12/19/18 0507  WBC 10.3 8.5 5.0 7.2  NEUTROABS  --  6.7  --   --   HGB 14.0 15.5* 12.7 13.0  HCT 44.1 50.4* 40.7 40.9  MCV 69.4* 69.2* 69.3* 68.6*  PLT 201 203 181 956   Basic Metabolic Panel: Recent Labs  Lab 12/16/18 2022 12/17/18 0521 12/17/18 0955 12/18/18 0438 12/19/18 0507  NA 137  --  137 133* 135  K 3.3*  --  3.3* 3.8 3.4*  CL 99  --  97* 97* 101  CO2 27  --  28 26 24   GLUCOSE 170*  --  168* 180* 150*  BUN 12  --  9 9 10   CREATININE 0.68  --  0.72 0.85 0.80  CALCIUM 8.6*  --  8.8* 8.5* 8.6*  MG  --  1.6*  --  2.5* 1.8   GFR: Estimated Creatinine Clearance: 54.9 mL/min (by C-G formula based on SCr of 0.8 mg/dL). Liver Function Tests: Recent Labs  Lab 12/16/18 2022 12/17/18 0955 12/18/18 0438 12/19/18 0507  AST 32 38 86* 56*  ALT 25 24 59* 51*  ALKPHOS 48 50 56 56  BILITOT 1.7* 1.8* 1.0 1.4*  PROT 6.8 7.4 6.1* 6.4*  ALBUMIN 3.7 3.9 3.2* 3.4*   Recent Labs  Lab 12/16/18 2022  LIPASE 43   Recent Labs  Lab 12/16/18 2350  AMMONIA 13   Coagulation Profile: Recent Labs  Lab 12/17/18 0030 12/18/18 0438 12/19/18 0507  INR 1.54 1.58 2.41   Cardiac Enzymes: Recent Labs  Lab 12/17/18 0521 12/17/18 0955 12/17/18 1534  TROPONINI 1.15* 1.30* 1.27*   BNP (last 3 results) No results for input(s): PROBNP in the last 8760 hours. HbA1C: Recent Labs    12/17/18 0521  HGBA1C 8.6*   CBG: Recent Labs  Lab 12/18/18 1145 12/18/18 1634 12/18/18 2030 12/19/18 0750 12/19/18 1122  GLUCAP 153* 148* 170* 119* 229*   Lipid Profile: Recent Labs    12/17/18 0521  CHOL 89  HDL 47  LDLCALC 33  TRIG 43  CHOLHDL 1.9   Thyroid Function Tests: No results for input(s): TSH, T4TOTAL, FREET4, T3FREE, THYROIDAB in the last 72 hours. Anemia Panel: No  results for input(s): VITAMINB12, FOLATE, FERRITIN, TIBC, IRON, RETICCTPCT in the last 72 hours. Sepsis Labs: No results for input(s): PROCALCITON, LATICACIDVEN in the last 168 hours.  No results found for this or any previous visit (from the past 240 hour(s)).       Radiology Studies: No results found.      Scheduled Meds: . aspirin EC  81 mg Oral Daily  . atorvastatin  20 mg Oral q1800  . bisacodyl  10 mg Rectal Daily  . feeding supplement (GLUCERNA SHAKE)  237 mL Oral Daily  . insulin aspart  0-9 Units Subcutaneous TID WC  . insulin glargine  5 Units Subcutaneous QHS  . losartan  12.5 mg Oral Daily  . metoCLOPramide  5 mg Oral TID AC & HS  . metoprolol tartrate  7.5 mg Intravenous Q6H  . pantoprazole (PROTONIX) IV  40 mg Intravenous Q24H  . potassium chloride  40 mEq Oral Once  . sodium chloride flush  3 mL Intravenous Once  . sucralfate  1 g Oral TID WC & HS  . warfarin  4 mg Oral ONCE-1800  . Warfarin - Pharmacist Dosing Inpatient   Does not apply q1800   Continuous Infusions:    LOS: 1 day    Time spent: 35 minutes    Irine Seal, MD [facility]  If 7PM-7AM, please contact night-coverage [email or web address] 12/19/2018, 11:43 AM

## 2018-12-19 NOTE — Progress Notes (Signed)
Pt husband requesting to speak to a GI specialist to ask them to put contents under a microscope. No emesis noted by nurse or NT this shift. Will continue to monitor.

## 2018-12-19 NOTE — Progress Notes (Addendum)
Progress Note  Patient Name: SHARAYA BORUFF Date of Encounter: 12/19/2018  Primary Cardiologist: Pixie Casino, MD   Subjective   Some SOB this AM  Inpatient Medications    Scheduled Meds: . aspirin EC  81 mg Oral Daily  . atorvastatin  20 mg Oral q1800  . bisacodyl  10 mg Rectal Daily  . feeding supplement (GLUCERNA SHAKE)  237 mL Oral Daily  . furosemide  40 mg Oral Daily  . insulin aspart  0-9 Units Subcutaneous TID WC  . insulin glargine  5 Units Subcutaneous QHS  . losartan  12.5 mg Oral Daily  . metoCLOPramide  5 mg Oral TID AC & HS  . metoprolol tartrate  7.5 mg Intravenous Q6H  . pantoprazole (PROTONIX) IV  40 mg Intravenous Q24H  . sodium chloride flush  3 mL Intravenous Once  . sucralfate  1 g Oral TID WC & HS  . Warfarin - Pharmacist Dosing Inpatient   Does not apply q1800   Continuous Infusions: . heparin 800 Units/hr (12/18/18 1254)   PRN Meds: acetaminophen, guaiFENesin, hydrALAZINE, metoCLOPramide (REGLAN) injection, morphine injection, nitroGLYCERIN, ondansetron, zolpidem   Vital Signs    Vitals:   12/18/18 1316 12/18/18 1349 12/18/18 2029 12/19/18 0626  BP: 130/81 129/78 111/78 129/83  Pulse: (!) 117 (!) 114 100 (!) 54  Resp: 19  18 18   Temp: 99.1 F (37.3 C) 98.8 F (37.1 C) 98.6 F (37 C) 99.2 F (37.3 C)  TempSrc: Oral Oral Oral Oral  SpO2: 99%  99% 95%  Weight:      Height:        Intake/Output Summary (Last 24 hours) at 12/19/2018 0703 Last data filed at 12/18/2018 1500 Gross per 24 hour  Intake 228.41 ml  Output -  Net 228.41 ml   Last 3 Weights 12/18/2018 12/17/2018 12/16/2018  Weight (lbs) 106 lb 7.7 oz 102 lb 15.3 oz 105 lb 13.1 oz  Weight (kg) 48.3 kg 46.7 kg 48 kg      Telemetry    SR and sinus tach - Personally Reviewed  ECG    na  Physical Exam   GEN: No acute distress.   Neck: elevated JVD Cardiac: RRR, no murmurs, rubs, or gallops.  Respiratory: Clear to auscultation bilaterally. GI: Soft, nontender,  non-distended  MS: No edema; No deformity. Neuro:  Nonfocal  Psych: Normal affect   Labs    Chemistry Recent Labs  Lab 12/17/18 0955 12/18/18 0438 12/19/18 0507  NA 137 133* 135  K 3.3* 3.8 3.4*  CL 97* 97* 101  CO2 28 26 24   GLUCOSE 168* 180* 150*  BUN 9 9 10   CREATININE 0.72 0.85 0.80  CALCIUM 8.8* 8.5* 8.6*  PROT 7.4 6.1* 6.4*  ALBUMIN 3.9 3.2* 3.4*  AST 38 86* 56*  ALT 24 59* 51*  ALKPHOS 50 56 56  BILITOT 1.8* 1.0 1.4*  GFRNONAA >60 >60 >60  GFRAA >60 >60 >60  ANIONGAP 12 10 10      Hematology Recent Labs  Lab 12/17/18 0955 12/18/18 0438 12/19/18 0507  WBC 8.5 5.0 7.2  RBC 7.28* 5.87* 5.96*  HGB 15.5* 12.7 13.0  HCT 50.4* 40.7 40.9  MCV 69.2* 69.3* 68.6*  MCH 21.3* 21.6* 21.8*  MCHC 30.8 31.2 31.8  RDW 19.3* 17.9* 17.7*  PLT 203 181 182    Cardiac Enzymes Recent Labs  Lab 12/17/18 0521 12/17/18 0955 12/17/18 1534  TROPONINI 1.15* 1.30* 1.27*    Recent Labs  Lab 12/17/18 0001 12/17/18  0323  TROPIPOC 0.54* 0.74*     BNP Recent Labs  Lab 12/16/18 2350  BNP 1,200.8*     DDimer No results for input(s): DDIMER in the last 168 hours.   Radiology    No results found.  Cardiac Studies     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 She is been unable to take all of her medications for several months. Because of nausea and vomiting.  Assessment & Plan    1. Chronic systolic HF - Jan 1610 echo LVEF 20-25%, grade II diastolic dysfunction - 96/0454 cath no significant CAD - from notes medical therapy has been limited due to chronic N/V, unable to keep her pills down - currently only on oral losartan.  - elevated JVD this AM, reports some SOB. Dose IV lasix 75m today, hold oral lasix.   2. LV thrombus - on heparin gtt, has not been able to keep oral coumadin down. - INR is therapeutic today, we can stop hep gtt.   3. Troponin elevation - peak 1.3, EKG nonspecific ST/T changes. No chest pain.   - recent cath 09/2018 without significant disease - possible demand ischemia with N/V, tachycardia - with inability to keep pills down would not be a cath candidate.    Not much to add over the weekend, once keeping oral pills down would work to get her back on Toprol . Call with questions over the weekend.   For questions or updates, please contact C[place] NewsPlease consult [email or web address] for contact info under        Signed, BCarlyle Dolly MD  12/19/2018, 7:03 AM

## 2018-12-19 NOTE — Progress Notes (Addendum)
ANTICOAGULATION CONSULT NOTE - Follow Up  Pharmacy Consult for IV heparin/Warfarin Indication: LV thrombus  No Known Allergies  Patient Measurements: Height: 5\' 3"  (160 cm) Weight: 106 lb 7.7 oz (48.3 kg) IBW/kg (Calculated) : 52.4 Heparin Dosing Weight: actual  Vital Signs: Temp: 99.2 F (37.3 C) (01/25 0626) Temp Source: Oral (01/25 0626) BP: 129/83 (01/25 0626) Pulse Rate: 54 (01/25 0626)  Labs: Recent Labs    12/17/18 0030 12/17/18 0521  12/17/18 0955 12/17/18 1534 12/18/18 0438 12/19/18 0507  HGB  --   --   --  15.5*  --  12.7 13.0  HCT  --   --   --  50.4*  --  40.7 40.9  PLT  --   --   --  203  --  181 182  APTT 28  --   --   --   --   --   --   LABPROT 18.3*  --   --   --   --  18.7* 25.9*  INR 1.54  --   --   --   --  1.58 2.41  HEPARINUNFRC  --   --    < > 0.46 0.58 0.63 0.80*  CREATININE  --   --   --  0.72  --  0.85 0.80  TROPONINI  --  1.15*  --  1.30* 1.27*  --   --    < > = values in this interval not displayed.    Estimated Creatinine Clearance: 54.9 mL/min (by C-G formula based on SCr of 0.8 mg/dL).   Medical History: Past Medical History:  Diagnosis Date  . Acute combined systolic and diastolic HF (heart failure) ([place]) 10/16/2018  . Allergic rhinitis    Requires cetirizine, singulair, and fluticasone.  . Anxiety    Has been on Xanax since 2009. Uses it for stress, anxiety, and insomnia. No contract yet.  . Arthritis   . CHF (congestive heart failure) (HCC)    EF 35% after stroke, presumed ischemic  . Chronic pain    Has OA of knees B. No Xrays in echart. Not requiring narcotics.  . Colitis 12/2017  . Depression   . Diabetes mellitus    Type 2, non insulin dependent. Was dx'd prior to 2008.  Marland Kitchen Hyperglycemia   . Hyperlipemia   . Hypertension   . Sickle cell trait ([place])   . Stroke [facility]) 09/05/14   Dominant left MCA infarcts secondary to unknown embolic source     Medications:  Scheduled:  . aspirin EC  81 mg Oral Daily  .  atorvastatin  20 mg Oral q1800  . bisacodyl  10 mg Rectal Daily  . feeding supplement (GLUCERNA SHAKE)  237 mL Oral Daily  . furosemide  40 mg Oral Daily  . insulin aspart  0-9 Units Subcutaneous TID WC  . insulin glargine  5 Units Subcutaneous QHS  . losartan  12.5 mg Oral Daily  . metoCLOPramide  5 mg Oral TID AC & HS  . metoprolol tartrate  7.5 mg Intravenous Q6H  . pantoprazole (PROTONIX) IV  40 mg Intravenous Q24H  . sodium chloride flush  3 mL Intravenous Once  . sucralfate  1 g Oral TID WC & HS  . Warfarin - Pharmacist Dosing Inpatient   Does not apply q1800   Infusions:  . heparin 800 Units/hr (12/18/18 1254)    Assessment: 32 yoF with hx of LV thrombus on chronic warfarin admitted 1/23 with abdominal pain. Found to have  elevated troponin, Cardiology consulted, no cath at this time. Admission INR= 1.54.  IV heparin and warfarin per Rx.  Home warfarin dose 5mg  daily except 7.5mg  Mon & Fri  Today. 12/19/18  Heparin level increased to supra- therapeutic 0.8 on 800 units/hr  INR with large increase overnight from 1.58 >> 2.41 (therapeutic)  Suspect that poor PO intake caused exaggerated warfarin response  RN/NT reports nausea overnight, but NO emesis.  CBC: Hgb remains stable at 13, Plt decreased/stable/WNL at 182.   LFTs: AST/ALT mildly elevated, Tbili 1.4 elevated, Albumin 3.4  No reported bleeding  Drug-drug interactions: Aspirin 81 mg  Goal of Therapy:  INR 2-3  HL = 0.3-0.7 Monitor platelets by anticoagulation protocol: Yes   Plan:  Discontinue heparin infusion for therapeutic INR. Recheck INR tomorrow, will NOT plan to resume Heparin unless INR < 2 Continue to monitor H&H and platelets Daily PT/INR, heparin level and CBC Warfarin 4 mg PO today at 1800 - reduced dose.   Gretta Arab PharmD, BCPS Pager [phone] 12/19/2018 7:05 AM

## 2018-12-20 LAB — GLUCOSE, CAPILLARY
Glucose-Capillary: 187 mg/dL — ABNORMAL HIGH (ref 70–99)
Glucose-Capillary: 198 mg/dL — ABNORMAL HIGH (ref 70–99)
Glucose-Capillary: 196 mg/dL — ABNORMAL HIGH (ref 70–99)
Glucose-Capillary: 196 mg/dL — ABNORMAL HIGH (ref 70–99)

## 2018-12-20 LAB — CBC
HCT: 40 % (ref 36.0–46.0)
Hemoglobin: 12.7 g/dL (ref 12.0–15.0)
MCH: 21.5 pg — ABNORMAL LOW (ref 26.0–34.0)
MCHC: 31.8 g/dL (ref 30.0–36.0)
MCV: 67.8 fL — ABNORMAL LOW (ref 80.0–100.0)
Platelets: 184 10*3/uL (ref 150–400)
RBC: 5.9 MIL/uL — ABNORMAL HIGH (ref 3.87–5.11)
RDW: 17.2 % — ABNORMAL HIGH (ref 11.5–15.5)
WBC: 8.6 10*3/uL (ref 4.0–10.5)
nRBC: 0 % (ref 0.0–0.2)

## 2018-12-20 LAB — BASIC METABOLIC PANEL
Anion gap: 6 (ref 5–15)
BUN: 10 mg/dL (ref 8–23)
CO2: 29 mmol/L (ref 22–32)
Calcium: 8.4 mg/dL — ABNORMAL LOW (ref 8.9–10.3)
Chloride: 99 mmol/L (ref 98–111)
Creatinine, Ser: 0.82 mg/dL (ref 0.44–1.00)
GFR calc non Af Amer: >60 mL/min (ref >60)
Glucose, Bld: 251 mg/dL — ABNORMAL HIGH (ref 70–99)
Potassium: 3.9 mmol/L (ref 3.5–5.1)
Sodium: 134 mmol/L — ABNORMAL LOW (ref 135–145)

## 2018-12-20 LAB — PROTIME-INR
INR: 2.63
Prothrombin Time: 27.7 seconds — ABNORMAL HIGH (ref 11.4–15.2)

## 2018-12-20 MED ORDER — WARFARIN SODIUM 5 MG PO TABS
5.0000 mg | ORAL_TABLET | Freq: Once | ORAL | Status: AC
  Administered 2018-12-20: 5 mg via ORAL
  Filled 2018-12-20: qty 1

## 2018-12-20 MED ORDER — METOPROLOL SUCCINATE ER 100 MG PO TB24
100.0000 mg | ORAL_TABLET | Freq: Every day | ORAL | Status: DC
  Administered 2018-12-20 – 2018-12-22 (×3): 100 mg via ORAL
  Filled 2018-12-20 (×3): qty 1

## 2018-12-20 MED ORDER — PANTOPRAZOLE SODIUM 40 MG PO TBEC
40.0000 mg | DELAYED_RELEASE_TABLET | Freq: Every day | ORAL | Status: DC
  Administered 2018-12-20 – 2018-12-22 (×3): 40 mg via ORAL
  Filled 2018-12-20 (×3): qty 1

## 2018-12-20 MED ORDER — FUROSEMIDE 40 MG PO TABS
40.0000 mg | ORAL_TABLET | Freq: Every day | ORAL | Status: DC
  Administered 2018-12-20 – 2018-12-22 (×3): 40 mg via ORAL
  Filled 2018-12-20 (×3): qty 1

## 2018-12-20 MED ORDER — METOPROLOL SUCCINATE ER 50 MG PO TB24
50.0000 mg | ORAL_TABLET | Freq: Every day | ORAL | Status: DC
  Administered 2018-12-20 – 2018-12-21 (×2): 50 mg via ORAL
  Filled 2018-12-20 (×2): qty 1

## 2018-12-20 MED ORDER — POTASSIUM CHLORIDE CRYS ER 10 MEQ PO TBCR
10.0000 meq | EXTENDED_RELEASE_TABLET | Freq: Every day | ORAL | Status: DC
  Administered 2018-12-20 – 2018-12-22 (×3): 10 meq via ORAL
  Filled 2018-12-20 (×3): qty 1

## 2018-12-20 NOTE — Progress Notes (Signed)
Pt able to tolerate PO medications in addition to tomato soup per her request. No complaints of nausea, abdominal discomfort, or vomiting this shift. Will continue to monitor.

## 2018-12-20 NOTE — Progress Notes (Signed)
Pt states was able to get a good night of rest and reports no nausea or vomiting. Will continue to monitor.

## 2018-12-20 NOTE — Progress Notes (Signed)
ANTICOAGULATION CONSULT NOTE - Follow Up  Pharmacy Consult for warfarin Indication: LV thrombus  No Known Allergies  Patient Measurements: Height: 5\' 3"  (160 cm) Weight: 106 lb 7.7 oz (48.3 kg) IBW/kg (Calculated) : 52.4 Heparin Dosing Weight: actual  Vital Signs: Temp: 98.7 F (37.1 C) (01/26 0506) Temp Source: Oral (01/26 0506) BP: 143/94 (01/26 0506) Pulse Rate: 81 (01/26 0506)  Labs: Recent Labs    12/17/18 0955 12/17/18 1534 12/18/18 0438 12/19/18 0507 12/20/18 0425  HGB 15.5*  --  12.7 13.0 12.7  HCT 50.4*  --  40.7 40.9 40.0  PLT 203  --  181 182 184  LABPROT  --   --  18.7* 25.9* 27.7*  INR  --   --  1.58 2.41 2.63  HEPARINUNFRC 0.46 0.58 0.63 0.80*  --   CREATININE 0.72  --  0.85 0.80 0.82  TROPONINI 1.30* 1.27*  --   --   --     Estimated Creatinine Clearance: 53.5 mL/min (by C-G formula based on SCr of 0.82 mg/dL).   Medical History: Past Medical History:  Diagnosis Date  . Acute combined systolic and diastolic HF (heart failure) ([place]) 10/16/2018  . Allergic rhinitis    Requires cetirizine, singulair, and fluticasone.  . Anxiety    Has been on Xanax since 2009. Uses it for stress, anxiety, and insomnia. No contract yet.  . Arthritis   . CHF (congestive heart failure) (HCC)    EF 35% after stroke, presumed ischemic  . Chronic pain    Has OA of knees B. No Xrays in echart. Not requiring narcotics.  . Colitis 12/2017  . Depression   . Diabetes mellitus    Type 2, non insulin dependent. Was dx'd prior to 2008.  Marland Kitchen Hyperglycemia   . Hyperlipemia   . Hypertension   . Sickle cell trait ([place])   . 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 aspirin EC  81 mg Oral Daily  . atorvastatin  20 mg Oral q1800  . bisacodyl  10 mg Rectal Daily  . feeding supplement (GLUCERNA SHAKE)  237 mL Oral Daily  . furosemide  40 mg Oral Daily  . insulin aspart  0-9 Units Subcutaneous TID  WC  . insulin glargine  5 Units Subcutaneous QHS  . losartan  12.5 mg Oral Daily  . metoCLOPramide  5 mg Oral TID AC & HS  . metoprolol succinate  100 mg Oral QPC breakfast  . metoprolol succinate  50 mg Oral QHS  . pantoprazole (PROTONIX) IV  40 mg Intravenous Q24H  . potassium chloride  10 mEq Oral Daily  . sodium chloride flush  3 mL Intravenous Once  . sucralfate  1 g Oral TID WC & HS  . Warfarin - Pharmacist Dosing Inpatient   Does not apply q1800   Infusions:    Assessment: 1 yoF with hx of LV thrombus on chronic warfarin admitted 1/23 with abdominal pain. Found to have elevated troponin, Cardiology consulted, no cath at this time. Admission INR= 1.54.  IV heparin and warfarin per Rx.  Home warfarin dose 5mg  daily except 7.5mg  Mon & Fri  Today. 12/20/18  INR continues to be therapeutic  Suspect that poor PO intake caused exaggerated warfarin response previous day but meal intake now improved per RN charting  CBC: stable.   LFTs: AST/ALT mildly elevated, Tbili 1.4 elevated, Albumin 3.4  No reported bleeding  Drug-drug  interactions: Aspirin 81 mg  Goal of Therapy:  INR 2-3  Monitor platelets by anticoagulation protocol: Yes   Plan:  1) Warfarin 5mg  this PM 2) Daily PT/INR, heparin level and CBC    Adrian Saran, PharmD, BCPS Pager [phone] 12/20/2018 9:28 AM

## 2018-12-20 NOTE — Progress Notes (Signed)
PROGRESS NOTE    Kristen Lozano  DZH:[id number] DOB: [birth year] DOA: 12/16/2018 PCP: Audley Hose, MD    Brief Narrative:  Per Dr. [name] is a 63 y.o. female with medical history significant of hypertension, hyperlipidemia, diabetes mellitus, stroke, GERD, CHF with EF of 20%, colitis, sickle cell trait, left ventricle mural thrombus on Coumadin, GERD, depression, anxiety, chronic nausea, vomiting abdominal pain, who presents with nausea, vomiting and abdominal pain.  Patient has chronic nausea, vomiting, abdominal pain.  Has multiple ED visit and recent admission from 12/28-12/31.  Per discharge summary, "work-up done in the past including EGD and gastric emptying study without clear diagnosis. CT of the abdomen was done in November showed some liver congestion. KUB without any stool burden".  Per her daughter, patient continues to have nausea, vomiting and abdominal pain intermittently.  She has vomited several times with nonbilious, nonbloody vomitus.  Her abdominal pain is located in the central abdomen, intermittent, sharp, nonradiating.  Patient does not have diarrhea.  Patient has mild cough.  She is drowsy and has some mild confusion, but is still orientated x3 when I saw patient in ED. She is not sure whether she has any chest pain.  No unilateral weakness.  No facial droop or slurred speech.  Denies symptoms of UTI.  ED Course: pt was found to have WBC 10.3, BNP 1200, INR 1.54, PTT 28, troponin 0.57-->0.74, lipase 43, negative urinalysis, potassium 3.3, creatinine and BUN normal, ammonia 13, temperature normal, heart rate 42- 116, no tachypnea, oxygen saturation 97% on room air.  CT head is negative for acute intracranial abnormalities, but showed bilateral MCA infarction.  MR venogram is negative.  Patient is placed on telemetry bed for observation.  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  This is a ?? chronic issue.??  ?? Secondary to constipation.  Patient had extensive work-up in the past without clear diagnosis made.  Patient has had a HIDA scan which was unremarkable, CT abdomen and pelvis which was unremarkable, upper endoscopy which was unremarkable, right upper quadrant ultrasound.  Repeat LFTs. Lipase normal on admission.  Patient was given an enema, Dulcolax suppositories daily and Reglan changed to before meals and at bedtime.  Patient had some significant bowel movements and now tolerating oral intake.  Diet has been advanced to a soft diet which patient has tolerated.  No further bouts of nausea or emesis over the past 24 hours.  Continue current regimen of Reglan 5 mg before meals at bedtime.  Dulcolax suppositories daily.  May need to be placed on a bowel regimen on discharge and Dulcolax changed to PRN.  Follow.  Idiopathic dilated cardiomyopathy Patient noted to have idiopathic dilated cardiomyopathy.  2D echo with a EF of 20 to 25% with diffuse hypokinesis and ventricular septal abnormal function with dyssynergy unchanged from prior 2D echo.  Patient has been seen by cardiology and is noted per their note that patient's dilated cardiomyopathy difficult to treat as patient unable to take her medications due to loss of nausea and  vomiting and cannot keep her medications down.  Patient currently on IV Lopressor and oral Cozaar and Lasix.  Cardiology recommending to resume oral Toprol-XL, losartan and furosemide when patient is taking p.o. well.  Patient now tolerating oral intake and as  such we will resume Toprol-XL and cardiac medications.  INR now therapeutic and as such IV heparin has been discontinued.  Coumadin per pharmacy.  Per cardiology.   Elevated troponin: Troponin 0.57-->0.74 >>> 1.15>>>> 1.30>>> 1.27. Patient denies any chest pain. Patient on IV heparin.  Fasting lipid panel with LDL of 33.  2D echo with EF of 20 to 25%, diffuse hypokinesis, trivial aortic valvular regurgitation, ventricular septum with abnormal function dyssynergy, mildly dilated left atrium, no significant change from prior 2D echo.  Patient presented with nausea emesis and abdominal pain which has been ongoing for several months.  Patient was unable to keep most of her medications down.  Patient seen in consultation by cardiology who feel patient's elevated troponin most likely due to demand ischemia in the setting of his severe LV dysfunction and tachycardia.  Patient noted not to be a good candidate for invasive procedures at this point.  Per cardiology patient may be a candidate for heart catheterization when she is eating better, stronger and tolerating cardiac medications.  Patient now tolerating oral intake better.  Patient tolerating a soft diet.  Change IV Lopressor back to home dose oral Toprol.  Continue Cozaar, statin, aspirin.  Cardiology following and appreciate input and recommendations.  LV (left ventricular) mural thrombus following MI: on coumadin, INR= 2.63 -Patient on Coumadin.  INR now therapeutic.  IV heparin has been discontinued per pharmacy.  Coumadin per pharmacy. Patient with with improvement of nausea and emesis and now tolerating oral intake.  Coumadin per pharmacy.   Acute metabolic encephalopathy: pt is still oriented x3.  CT head is negative for acute intracranial abnormalities MR venogram is negative.    Patient likely close to baseline as patient with prior history of CVA and per husband patient with some bouts of confusion which is chronic.  Follow.    HTN:  -Oral  Toprol was changed to IV Lopressor and dose increased to 7.5 mg every 6 hours as patient noted to have nausea and emesis with poor oral intake.  Continue oral Cozaar if able to tolerate.  Patient now tolerating oral intake.  Discontinue IV Lopressor resume home dose oral Toprol.   Stroke [facility]): -on ASA, and lipitor.  Patient now tolerating oral intake.  Follow.    Chronic combined systolic and diastolic CHF (congestive heart failure) ([place]): BNP 1200, patient clinically looks euvolemic on examination on admission.  Patient with positive JVD.  No lower extremity edema.  No crackles noted on examination. Patient was given a dose of IV Lasix on admission.  Patient with poor oral intake.  Urine output over the past 24 hours not properly recorded. No acute respiratory distress.  Patient with improvement with nausea and vomiting and now tolerating oral intake.  Patient on a soft diet. Continue home dose Lipitor, aspirin, Cozaar,.  Discontinue IV Lopressor resume home dose oral Toprol.  Resume home dose Lasix today which was held yesterday as patient given a dose of IV Lasix per cardiology on 12/19/2018. Cardiology following.  Hypokalemia and hypomagnesemia: K= 3.3 and Mg 1.6  on admission. -Potassium now at 3.9.  Magnesium at 1.8.  Follow.  Type 2 diabetes mellitus with vascular disease ([place]): Last A1c 8.3 on 11/22/18. Patient is taking metformin, Lantus at home.  Patient with nausea and emesis and poor oral intake at this time.  CBG of 187 this morning.  Continue sliding scale insulin and Lantus 5 units daily.  Follow.  HLD (hyperlipidemia) -Patient currently tolerating oral intake over the past 24 hours.  Continue statin.   Severe protein calorie malnutrition Likely secondary to poor oral intake due to ongoing abdominal pain nausea and vomiting and inability to keep anything down.  Patient advanced to a soft diet yesterday and tolerating oral intake.  No nausea or vomiting.  Denies any abdominal pain.   Continue current scheduled Reglan before meals and at bedtime as well as Dulcolax suppositories.  Patient will likely need a bowel regimen on discharge.    DVT prophylaxis: Coumadin. Code Status: Full Family Communication: Updated husband at bedside.  Disposition Plan: Likely home once tolerating oral intake, with clinical improvement and when okay with cardiology.   Consultants:   Cardiology: Dr. Acie Fredrickson 12/17/2018  Procedures:   2D echo 12/17/2018  CT head without contrast 12/17/2018  Antimicrobials:   None   Subjective: Patient sleeping however easily arousable.  Husband at bedside.  Patient denies any further nausea or emesis or abdominal pain.  Patient noted to have tolerated a soft diet.  Patient had bowel movements yesterday.  No bowel movement today.    Objective: Vitals:   12/19/18 1337 12/19/18 1746 12/19/18 2059 12/20/18 0506  BP:  114/71 115/90 (!) 143/94  Pulse: 100  (!) 106 81  Resp:   18 18  Temp:   99.5 F (37.5 C) 98.7 F (37.1 C)  TempSrc:   Oral Oral  SpO2:   97%   Weight:      Height:        Intake/Output Summary (Last 24 hours) at 12/20/2018 1204 Last data filed at 12/20/2018 0852 Gross per 24 hour  Intake 462 ml  Output -  Net 462 ml   Filed Weights   12/16/18 2011 12/17/18 0448 12/18/18 0529  Weight: 48 kg 46.7 kg 48.3 kg    Examination:  General exam: NAD.  Frail.  [place].  Chronically ill-appearing. Respiratory system: CTAB.  No wheezes, no crackles, no rhonchi. Respiratory effort normal. Cardiovascular system: RRR no murmurs rubs or gallops.  No lower extremity edema noted. Gastrointestinal system: Abdomen is nontender, nondistended, soft, positive bowel sounds.  No rebound.  No guarding.  Central nervous system: Alert and oriented. No focal neurological deficits. Extremities: Symmetric 5 x 5 power. Skin: No rashes, lesions or ulcers Psychiatry: Judgement and insight appear normal. Mood & affect appropriate.     Data Reviewed:  I have personally reviewed following labs and imaging studies  CBC: Recent Labs  Lab 12/16/18 2022 12/17/18 0955 12/18/18 0438 12/19/18 0507 12/20/18 0425  WBC 10.3 8.5 5.0 7.2 8.6  NEUTROABS  --  6.7  --   --   --   HGB 14.0 15.5* 12.7 13.0 12.7  HCT 44.1 50.4* 40.7 40.9 40.0  MCV 69.4* 69.2* 69.3* 68.6* 67.8*  PLT 201 203 181 182 413   Basic Metabolic Panel: Recent Labs  Lab 12/16/18 2022 12/17/18 0521 12/17/18 0955 12/18/18 0438 12/19/18 0507 12/20/18 0425  NA 137  --  137 133* 135 134*  K 3.3*  --  3.3* 3.8 3.4* 3.9  CL 99  --  97* 97* 101 99  CO2 27  --  28 26 24 29   GLUCOSE 170*  --  168* 180* 150* 251*  BUN 12  --  9 9 10  10  CREATININE 0.68  --  0.72 0.85 0.80 0.82  CALCIUM 8.6*  --  8.8* 8.5* 8.6* 8.4*  MG  --  1.6*  --  2.5* 1.8  --    GFR: Estimated Creatinine Clearance: 53.5 mL/min (by C-G formula based on SCr of 0.82 mg/dL). Liver Function Tests: Recent Labs  Lab 12/16/18 2022 12/17/18 0955 12/18/18 0438 12/19/18 0507  AST 32 38 86* 56*  ALT 25 24 59* 51*  ALKPHOS 48 50 56 56  BILITOT 1.7* 1.8* 1.0 1.4*  PROT 6.8 7.4 6.1* 6.4*  ALBUMIN 3.7 3.9 3.2* 3.4*   Recent Labs  Lab 12/16/18 2022  LIPASE 43   Recent Labs  Lab 12/16/18 2350  AMMONIA 13   Coagulation Profile: Recent Labs  Lab 12/17/18 0030 12/18/18 0438 12/19/18 0507 12/20/18 0425  INR 1.54 1.58 2.41 2.63   Cardiac Enzymes: Recent Labs  Lab 12/17/18 0521 12/17/18 0955 12/17/18 1534  TROPONINI 1.15* 1.30* 1.27*   BNP (last 3 results) No results for input(s): PROBNP in the last 8760 hours. HbA1C: No results for input(s): HGBA1C in the last 72 hours. CBG: Recent Labs  Lab 12/19/18 1122 12/19/18 1638 12/19/18 2059 12/20/18 0726 12/20/18 1144  GLUCAP 229* 180* 200* 187* 198*   Lipid Profile: No results for input(s): CHOL, HDL, LDLCALC, TRIG, CHOLHDL, LDLDIRECT in the last 72 hours. Thyroid Function Tests: No results for input(s): TSH, T4TOTAL, FREET4, T3FREE,  THYROIDAB in the last 72 hours. Anemia Panel: No results for input(s): VITAMINB12, FOLATE, FERRITIN, TIBC, IRON, RETICCTPCT in the last 72 hours. Sepsis Labs: No results for input(s): PROCALCITON, LATICACIDVEN in the last 168 hours.  No results found for this or any previous visit (from the past 240 hour(s)).       Radiology Studies: No results found.      Scheduled Meds: . aspirin EC  81 mg Oral Daily  . atorvastatin  20 mg Oral q1800  . bisacodyl  10 mg Rectal Daily  . feeding supplement (GLUCERNA SHAKE)  237 mL Oral Daily  . furosemide  40 mg Oral Daily  . insulin aspart  0-9 Units Subcutaneous TID WC  . insulin glargine  5 Units Subcutaneous QHS  . losartan  12.5 mg Oral Daily  . metoCLOPramide  5 mg Oral TID AC & HS  . metoprolol succinate  100 mg Oral QPC breakfast  . metoprolol succinate  50 mg Oral QHS  . pantoprazole  40 mg Oral Daily  . potassium chloride  10 mEq Oral Daily  . sodium chloride flush  3 mL Intravenous Once  . sucralfate  1 g Oral TID WC & HS  . warfarin  5 mg Oral ONCE-1800  . Warfarin - Pharmacist Dosing Inpatient   Does not apply q1800   Continuous Infusions:    LOS: 2 days    Time spent: 35 minutes    Irine Seal, MD [facility]  If 7PM-7AM, please contact night-coverage [email or web address] 12/20/2018, 12:04 PM

## 2018-12-21 LAB — CBC
HCT: 36.5 % (ref 36.0–46.0)
Hemoglobin: 11.6 g/dL — ABNORMAL LOW (ref 12.0–15.0)
MCH: 21.5 pg — ABNORMAL LOW (ref 26.0–34.0)
MCHC: 31.8 g/dL (ref 30.0–36.0)
MCV: 67.7 fL — ABNORMAL LOW (ref 80.0–100.0)
NRBC: 0 % (ref 0.0–0.2)
Platelets: 187 10*3/uL (ref 150–400)
RBC: 5.39 MIL/uL — AB (ref 3.87–5.11)
RDW: 16.5 % — ABNORMAL HIGH (ref 11.5–15.5)
WBC: 9.3 10*3/uL (ref 4.0–10.5)

## 2018-12-21 LAB — BASIC METABOLIC PANEL
Anion gap: 8 (ref 5–15)
BUN: 13 mg/dL (ref 8–23)
CALCIUM: 8.5 mg/dL — AB (ref 8.9–10.3)
CO2: 27 mmol/L (ref 22–32)
CREATININE: 0.95 mg/dL (ref 0.44–1.00)
Chloride: 96 mmol/L — ABNORMAL LOW (ref 98–111)
GFR calc Af Amer: >60 mL/min (ref >60)
GFR calc non Af Amer: >60 mL/min (ref >60)
Glucose, Bld: 191 mg/dL — ABNORMAL HIGH (ref 70–99)
Potassium: 4.3 mmol/L (ref 3.5–5.1)
SODIUM: 131 mmol/L — AB (ref 135–145)

## 2018-12-21 LAB — GLUCOSE, CAPILLARY
GLUCOSE-CAPILLARY: 217 mg/dL — AB (ref 70–99)
Glucose-Capillary: 179 mg/dL — ABNORMAL HIGH (ref 70–99)
Glucose-Capillary: 230 mg/dL — ABNORMAL HIGH (ref 70–99)
Glucose-Capillary: 204 mg/dL — ABNORMAL HIGH (ref 70–99)

## 2018-12-21 LAB — RAPID URINE DRUG SCREEN, HOSP PERFORMED
Amphetamines: NOT DETECTED
Barbiturates: NOT DETECTED
Benzodiazepines: NOT DETECTED
Cocaine: NOT DETECTED
Opiates: POSITIVE — AB
Tetrahydrocannabinol: NOT DETECTED

## 2018-12-21 LAB — PROTIME-INR
INR: 2.6
Prothrombin Time: 27.5 seconds — ABNORMAL HIGH (ref 11.4–15.2)

## 2018-12-21 MED ORDER — IVABRADINE HCL 5 MG PO TABS: 2.5000 mg | ORAL_TABLET | Freq: Two times a day (BID) | ORAL | Status: DC

## 2018-12-21 MED ORDER — POLYETHYLENE GLYCOL 3350 17 G PO PACK
17.0000 g | PACK | Freq: Every day | ORAL | Status: DC
  Administered 2018-12-21: 17 g via ORAL
  Filled 2018-12-21: qty 1

## 2018-12-21 MED ORDER — WARFARIN SODIUM 5 MG PO TABS
5.0000 mg | ORAL_TABLET | Freq: Once | ORAL | Status: AC
  Filled 2018-12-21: qty 1

## 2018-12-21 MED ORDER — IVABRADINE HCL 5 MG PO TABS
5.0000 mg | ORAL_TABLET | Freq: Two times a day (BID) | ORAL | Status: DC
  Administered 2018-12-22 (×2): 5 mg via ORAL
  Filled 2018-12-21 (×3): qty 1

## 2018-12-21 NOTE — Progress Notes (Addendum)
Progress Note  Patient Name: Kristen Lozano Date of Encounter: 12/21/2018  Primary Cardiologist: Pixie Casino, MD   Subjective   Patient denies chest pain or shortness of breath.  Had some abdominal pain last p.m., relieved by Reglan and a low-dose of morphine  She has had GI evaluation in the past by Dr. Havery Moros, husband is concerned that GI symptoms will happen again if he takes her home, wonders if GI can see her while she is here  Husband says that insurance did not cover ivabradine, feels it helped her, wonders if there are any options  Inpatient Medications    Scheduled Meds: . aspirin EC  81 mg Oral Daily  . atorvastatin  20 mg Oral q1800  . bisacodyl  10 mg Rectal Daily  . feeding supplement (GLUCERNA SHAKE)  237 mL Oral Daily  . furosemide  40 mg Oral Daily  . insulin aspart  0-9 Units Subcutaneous TID WC  . insulin glargine  5 Units Subcutaneous QHS  . losartan  12.5 mg Oral Daily  . metoCLOPramide  5 mg Oral TID AC & HS  . metoprolol succinate  100 mg Oral QPC breakfast  . metoprolol succinate  50 mg Oral QHS  . pantoprazole  40 mg Oral Daily  . polyethylene glycol  17 g Oral Daily  . potassium chloride  10 mEq Oral Daily  . sodium chloride flush  3 mL Intravenous Once  . sucralfate  1 g Oral TID WC & HS  . Warfarin - Pharmacist Dosing Inpatient   Does not apply q1800   Continuous Infusions:  PRN Meds: acetaminophen, guaiFENesin, hydrALAZINE, morphine injection, nitroGLYCERIN, ondansetron, zolpidem   Vital Signs    Vitals:   12/20/18 1246 12/20/18 2020 12/20/18 2300 12/21/18 0528  BP: 106/66 120/76  103/79  Pulse: (!) 115 (!) 111 98 (!) 105  Resp: 18 20  18   Temp: 97.7 F (36.5 C) 98.4 F (36.9 C)  99.2 F (37.3 C)  TempSrc: Oral Oral  Oral  SpO2: 96% 99%  100%  Weight:    48.3 kg  Height:        Intake/Output Summary (Last 24 hours) at 12/21/2018 0949 Last data filed at 12/21/2018 2836 Gross per 24 hour  Intake 440 ml  Output 300 ml    Net 140 ml   Last 3 Weights 12/21/2018 12/18/2018 12/17/2018  Weight (lbs) 106 lb 7.7 oz 106 lb 7.7 oz 102 lb 15.3 oz  Weight (kg) 48.3 kg 48.3 kg 46.7 kg      Telemetry    SR and sinus tach - Personally Reviewed  ECG    None today  Physical Exam   General: Well developed, very thin, female in no acute distress Head: Eyes PERRLA, No xanthomas.  Poor dentition normocephalic and atraumatic Lungs: Few rales bases. Heart: HRRR S1 S2, without MRG.  Pulses are 2+ & equal. No JVD. Abdomen: Bowel sounds are present, abdomen soft and non-tender without masses or  hernias noted. Msk: Normal strength and tone for age. Extremities: No clubbing, cyanosis or edema.    Skin:  No rashes or lesions noted. Neuro: Alert and oriented X 3. Psych:  Kermit Balo affect, responds appropriately   Labs    Chemistry Recent Labs  Lab 12/17/18 0955 12/18/18 0438 12/19/18 0507 12/20/18 0425 12/21/18 0416  NA 137 133* 135 134* 131*  K 3.3* 3.8 3.4* 3.9 4.3  CL 97* 97* 101 99 96*  CO2 28 26 24 29 27   GLUCOSE  168* 180* 150* 251* 191*  BUN 9 9 10 10 13   CREATININE 0.72 0.85 0.80 0.82 0.95  CALCIUM 8.8* 8.5* 8.6* 8.4* 8.5*  PROT 7.4 6.1* 6.4*  --   --   ALBUMIN 3.9 3.2* 3.4*  --   --   AST 38 86* 56*  --   --   ALT 24 59* 51*  --   --   ALKPHOS 50 56 56  --   --   BILITOT 1.8* 1.0 1.4*  --   --   GFRNONAA >60 >60 >60 >60 >60  GFRAA >60 >60 >60 >60 >60  ANIONGAP 12 10 10 6 8      Hematology Recent Labs  Lab 12/19/18 0507 12/20/18 0425 12/21/18 0416  WBC 7.2 8.6 9.3  RBC 5.96* 5.90* 5.39*  HGB 13.0 12.7 11.6*  HCT 40.9 40.0 36.5  MCV 68.6* 67.8* 67.7*  MCH 21.8* 21.5* 21.5*  MCHC 31.8 31.8 31.8  RDW 17.7* 17.2* 16.5*  PLT 182 184 187    Cardiac Enzymes Recent Labs  Lab 12/17/18 0521 12/17/18 0955 12/17/18 1534  TROPONINI 1.15* 1.30* 1.27*    Recent Labs  Lab 12/17/18 0001 12/17/18 0323  TROPIPOC 0.54* 0.74*     BNP Recent Labs  Lab 12/16/18 2350  BNP 1,200.8*    Lab  Results  Component Value Date   INR 2.60 12/21/2018   INR 2.63 12/20/2018   INR 2.41 12/19/2018    Radiology    No results found.  Cardiac Studies   ECHO: 12/17/2018 - Left ventricle: The cavity size was moderately dilated. Systolic   function was severely reduced. The estimated ejection fraction   was in the range of 20% to 25%. Slightly overestimated with 2D   speckle tracking. Diffuse hypokinesis. Although no diagnostic   regional wall motion abnormality was identified, this possibility   cannot be completely excluded on the basis of this study.   Features are consistent with a pseudonormal left ventricular   filling pattern, with concomitant abnormal relaxation and   increased filling pressure (grade 2 diastolic dysfunction). - Ventricular septum: Septal motion showed abnormal function and   dyssynergy. - Aortic valve: There was trivial regurgitation. - Mitral valve: There was moderate regurgitation. - Left atrium: The atrium was mildly dilated. - Tricuspid valve: There was mild-moderate regurgitation. - Pulmonary arteries: PA peak pressure: 42 mm Hg (S). - Pericardium, extracardiac: A trivial pericardial effusion was   identified. There was a left pleural effusion. Impressions: - No significant change from prior.  CARDIAC CATH: 10/14/2018  Mid RCA lesion is 15% stenosed.   No significant coronary obstructive disease with minimal 10 to 20% smooth narrowing in the mid RCA and otherwise normal LAD and left circumflex vessel.  Mild right heart pressure elevation with mild pulmonary hypertension with a PA mean pressure of 26 mm.  LVEDP 16 mm.  Recent echo Doppler with EF 20 to 25% and with patient's history of LV thrombus, left ventricular thrombus was not done.  RECOMMENDATION: Optimal guideline medical therapy for heart failure with reduced EF.  If blood pressure allows, consider Entresto.  With increased heart rate consider a ivabradine. Recommend resumption of  anticoagulation with warfarin tomorrow.  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 She is been unable to take all of her medications for several months because of nausea and vomiting.  Admitted 1/23 for nausea, vomiting, abdominal pain  Assessment & Plan    1. Chronic systolic HF -  Echo this admission with EF 20-25% and grade 2 diastolic dysfunction - Cath 09/2018, no significant CAD -Chronic issues with nausea and vomiting have limited medical therapy - Had been prescribed ivabradine 5 mg twice daily, Lasix 40 mg daily, 12.5 mg losartan daily and Toprol-XL 100 mg a.m. and 50 mg p.m. prior to admission -However, was only taking losartan PTA -Is currently on her home dose of metoprolol and losartan - 1/26, had some volume overload and got Lasix 40 mg IV -Now on Lasix 40 mg daily, home dose  2. LV thrombus - INR was 1.54 on admission and heparin started -Heparin discontinued once INR therapeutic  3. Troponin elevation -Peak troponin 1.3, nonspecific ECG changes -No chest pain - Recent cath showing 30% RCA, doubt another cath needed -Troponin elevation may be demand ischemia secondary to nausea, vomiting and tachycardia  Will make follow-up appointment For questions or updates, please contact [facility] Please consult [email or web address] for contact info under      Signed, Rosaria Ferries, PA-C  12/21/2018, 9:49 AM    Patient seen and independently examined with Rosaria Ferries, PA. We discussed all aspects of the encounter. I agree with the assessment and plan as stated above.  Remains in normal sinus rhythm on telemetry with improved heart rate at 93 - low 100 bpm.  Given her significant LV dysfunction would like her heart rate less than 80 bpm if possible.  I will ask the heart failure pharmacist at [facility] to check with patient's insurance to see how we can arrange to get ivabradine paid for.  I am going to start her on ivabradine 5  mg twice daily today.  She is now on p.o. Lasix but I's and O's are incomplete.  Weight has not really changed any with diuresis.  BP remains soft at 103/79 mmHg.  Labs today show creatinine 0.95 and potassium 4.3.  Continue Toprol-XL 50 mg nightly and 100 mg every morning, losartan 12.5 mg daily and Lasix 40 mg p.o. daily.  Continue on Coumadin for LV thrombus.  INR 2.6 today.  Signed  Fransico Him [facility] 12/21/2018

## 2018-12-21 NOTE — Progress Notes (Signed)
ANTICOAGULATION CONSULT NOTE - Follow Up Consult  Pharmacy Consult for warfarin Indication: hx CVA and LV thrombus  No Known Allergies  Patient Measurements: Height: 5\' 3"  (160 cm) Weight: 106 lb 7.7 oz (48.3 kg) IBW/kg (Calculated) : 52.4 Heparin Dosing Weight:   Vital Signs: Temp: 99.2 F (37.3 C) (01/27 0528) Temp Source: Oral (01/27 0528) BP: 103/79 (01/27 0528) Pulse Rate: 105 (01/27 0528)  Labs: Recent Labs    12/19/18 0507 12/20/18 0425 12/21/18 0416  HGB 13.0 12.7 11.6*  HCT 40.9 40.0 36.5  PLT 182 184 187  LABPROT 25.9* 27.7* 27.5*  INR 2.41 2.63 2.60  HEPARINUNFRC 0.80*  --   --   CREATININE 0.80 0.82 0.95    Estimated Creatinine Clearance: 46.2 mL/min (by C-G formula based on SCr of 0.95 mg/dL).   Medications:  PTA warfarin regimen: 5 mg daily except 7.5 mg on Mon and Fri  Assessment: Patient's a 63 y.o F with hx CVA and LV mural thrombus on warfarin PTA, presented to the ED on 12/17/18 with c/o n/v and abd pain.  Warfarin resumed on admission and pt was started on heparin d/t sub-therapeutic INR.  Heparin drip d/ced on 1/25 d/t therapeutic INR.  Today, 12/21/2018: - INR remains therapeutic at 2.60 - hgb down 11.6, plts stable - no signif. drug-drug intxns - 50-100% meals charted  Goal of Therapy:  INR 2-3 Monitor platelets by anticoagulation protocol: Yes   Plan:  - repeat warfarin 5 mg PO x1 - monitor for s/s bleeding  Otilia Kareem P 12/21/2018,1:11 PM

## 2018-12-21 NOTE — Progress Notes (Signed)
PROGRESS NOTE    Kristen Lozano  TZG:[id number] DOB: 06-20-1956 DOA: 12/16/2018 PCP: Audley Hose, MD    Brief Narrative:  Per Dr. [name] is a 63 y.o. female with medical history significant of hypertension, hyperlipidemia, diabetes mellitus, stroke, GERD, CHF with EF of 20%, colitis, sickle cell trait, left ventricle mural thrombus on Coumadin, GERD, depression, anxiety, chronic nausea, vomiting abdominal pain, who presents with nausea, vomiting and abdominal pain.  Patient has chronic nausea, vomiting, abdominal pain.  Has multiple ED visit and recent admission from 12/28-12/31.  Per discharge summary, "work-up done in the past including EGD and gastric emptying study without clear diagnosis. CT of the abdomen was done in November showed some liver congestion. KUB without any stool burden".  Per her daughter, patient continues to have nausea, vomiting and abdominal pain intermittently.  She has vomited several times with nonbilious, nonbloody vomitus.  Her abdominal pain is located in the central abdomen, intermittent, sharp, nonradiating.  Patient does not have diarrhea.  Patient has mild cough.  She is drowsy and has some mild confusion, but is still orientated x3 when I saw patient in ED. She is not sure whether she has any chest pain.  No unilateral weakness.  No facial droop or slurred speech.  Denies symptoms of UTI.  ED Course: pt was found to have WBC 10.3, BNP 1200, INR 1.54, PTT 28, troponin 0.57-->0.74, lipase 43, negative urinalysis, potassium 3.3, creatinine and BUN normal, ammonia 13, temperature normal, heart rate 42- 116, no tachypnea, oxygen saturation 97% on room air.  CT head is negative for acute intracranial abnormalities, but showed bilateral MCA infarction.  MR venogram is negative.  Patient is placed on telemetry bed for observation.  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  This is a ?? chronic issue.??  ??  Secondary to gout edema/idiopathic dilated cardiomyopathy with a EF of 20 to 25% plus or minus secondary to constipation.  Patient had extensive work-up in the past without clear diagnosis made.  Patient has had a HIDA scan which was unremarkable, CT abdomen and pelvis which was unremarkable, upper endoscopy which was unremarkable, right upper quadrant ultrasound.  Repeat LFTs. Lipase normal on admission.  Patient was given an enema, Dulcolax suppositories daily and Reglan changed to before meals and at bedtime.  Patient had some significant bowel movements and now tolerating oral intake.  Diet has been advanced to a soft diet which patient has tolerated.  No further bouts of nausea or emesis over the past 24 hours.  Continue current regimen of Reglan 5 mg before meals at bedtime.  Dulcolax suppositories daily.  Place on MiraLAX daily.  Patient started on ivabradine per cardiology.  Continue cardiac medications.  Has been insistent on stools to be checked for parasites and as such we will check a GI pathogen panel and will check stool for ova and parasites.  May need to be placed on a bowel regimen on discharge and Dulcolax changed to PRN.  Follow.  Idiopathic dilated cardiomyopathy Patient noted to  have idiopathic dilated cardiomyopathy.  2D echo with a EF of 20 to 25% with diffuse hypokinesis and ventricular septal abnormal function with dyssynergy unchanged from prior 2D echo.  Patient has been seen by cardiology and is noted per their note that patient's dilated cardiomyopathy  difficult to treat as patient unable to take her medications due to loss of nausea and vomiting and cannot keep her medications down.  Patient currently on oral Cozaar and Lasix.  Cardiology recommending to resume oral Toprol-XL, losartan and furosemide when patient is taking p.o. well.  Patient now tolerating oral intake and as such patient's home dose Toprol-XL has been resumed and her cardiac medications.  IV Lopressor has been discontinued.  INR now therapeutic and as such IV heparin has been discontinued.  Coumadin per pharmacy.  Per cardiology.   Elevated troponin: Troponin 0.57-->0.74 >>> 1.15>>>> 1.30>>> 1.27. Patient denies any chest pain. Patient on IV heparin.  Fasting lipid panel with LDL of 33.  2D echo with EF of 20 to 25%, diffuse hypokinesis, trivial aortic valvular regurgitation, ventricular septum with abnormal function dyssynergy, mildly dilated left atrium, no significant change from prior 2D echo.  Patient presented with nausea emesis and abdominal pain which has been ongoing for several months.  Patient was unable to keep most of her medications down.  Patient seen in consultation by cardiology who feel patient's elevated troponin most likely due to demand ischemia in the setting of his severe LV dysfunction and tachycardia.  Patient noted not to be a good candidate for invasive procedures at this point.  Per cardiology patient may be a candidate for heart catheterization when she is eating better, stronger and tolerating cardiac medications.  Patient now tolerating oral intake better.  Patient tolerating a soft diet.  IV Lopressor has been changed back to home dose oral Toprol. Continue Cozaar, statin, aspirin.  Patient has been started on ivabradine per cardiology.  Cardiology following and appreciate input and recommendations.  LV (left ventricular) mural thrombus following MI: on coumadin, INR= 2.60 -Patient on Coumadin.  INR now therapeutic.  IV heparin has been discontinued per  pharmacy.  Coumadin per pharmacy. Patient with with improvement of nausea and emesis and now tolerating oral intake.  Coumadin per pharmacy.   Acute metabolic encephalopathy: pt is still oriented x3.  CT head is negative for acute intracranial abnormalities MR venogram is negative.    Patient likely close to baseline as patient with prior history of CVA and per husband patient with some bouts of confusion which is chronic.  Follow.    HTN:  -Oral Toprol was changed to IV Lopressor and dose increased to 7.5 mg every 6 hours as patient noted to have nausea and emesis with poor oral intake.  Continue oral Cozaar if able to tolerate.  Patient now tolerating oral intake.  IV Lopressor has been changed back to patient's home dose oral Toprol.    Stroke [facility]): -on ASA, and lipitor.  Patient now tolerating oral intake.  Follow.    Chronic combined systolic and diastolic CHF (congestive heart failure) ([place]): BNP 1200, patient clinically looks euvolemic on examination on admission.  Patient with positive JVD.  No lower extremity edema.  No crackles noted on examination. Patient was given a dose of IV Lasix on admission.  Patient with poor oral intake.  Urine output over the past 24 hours not properly recorded. No acute respiratory distress.  Patient with improvement with nausea and vomiting and now tolerating oral intake.  Patient on a  soft diet. Continue home dose Lipitor, aspirin, Cozaar,.  Discontinued IV Lopressor and resumed home dose oral Toprol.  Resumed home dose Lasix on 12/20/2018,which was held on 12/19/2018, as patient given a dose of IV Lasix per cardiology on 12/19/2018.  Patient has been started back on ivabradine by cardiology.  Cardiology following.  Hypokalemia and hypomagnesemia: K= 3.3 and Mg 1.6  on admission. -Potassium now at 4.3.  Magnesium at 1.8.  Follow.  Type 2 diabetes mellitus with vascular disease ([place]): Last A1c 8.3 on 11/22/18. Patient is taking metformin, Lantus at home.   Patient with nausea and emesis and poor oral intake initially on admission.  Oral intake improving. CBG of 179 this morning.  Continue sliding scale insulin and Lantus 5 units daily.  Follow.  HLD (hyperlipidemia) -Patient currently tolerating oral intake over the past 24-48 hours.  Continue statin.   Severe protein calorie malnutrition Likely secondary to poor oral intake due to ongoing abdominal pain nausea and vomiting and inability to keep anything down.  Patient advanced to a soft diet and tolerating oral intake.  No nausea or vomiting.  Denies any abdominal pain.  Continue current scheduled Reglan before meals and at bedtime as well as Dulcolax suppositories.  We will add daily MiraLAX.  Patient will likely need a bowel regimen on discharge.    DVT prophylaxis: Coumadin. Code Status: Full Family Communication: Updated husband at bedside.  Disposition Plan: Likely home once tolerating oral intake, with clinical improvement and when okay with cardiology.   Consultants:   Cardiology: Dr. Acie Fredrickson 12/17/2018  Procedures:   2D echo 12/17/2018  CT head without contrast 12/17/2018  Antimicrobials:   None   Subjective: Patient sitting up in chair.  States she was able to tolerate breakfast this morning however husband states he was not at bedside.  Patient denies any emesis.  No bowel movement yesterday.  Denies any chest pain.  Denies any significant shortness of breath.  Patient tolerating her oral medications at this time.  Husband concerned patient will have nausea and vomiting and abdominal pain wants to get back home.  Per husband patient had some abdominal pain early on.  Patient's husband wanting stool to be checked for ova and parasites.  Objective: Vitals:   12/20/18 1246 12/20/18 2020 12/20/18 2300 12/21/18 0528  BP: 106/66 120/76  103/79  Pulse: (!) 115 (!) 111 98 (!) 105  Resp: 18 20  18   Temp: 97.7 F (36.5 C) 98.4 F (36.9 C)  99.2 F (37.3 C)  TempSrc: Oral Oral   Oral  SpO2: 96% 99%  100%  Weight:    48.3 kg  Height:        Intake/Output Summary (Last 24 hours) at 12/21/2018 1210 Last data filed at 12/21/2018 1000 Gross per 24 hour  Intake 560 ml  Output 300 ml  Net 260 ml   Filed Weights   12/17/18 0448 12/18/18 0529 12/21/18 0528  Weight: 46.7 kg 48.3 kg 48.3 kg    Examination:  General exam: NAD.  Frail.  Cachectic.  Chronically ill-appearing. Respiratory system: Lungs are to auscultation bilaterally.  No wheezes, no crackles, no rhonchi.  Normal respiratory effort. Cardiovascular system: Tachycardic.  No lower extremity edema.  Positive JVD.  Gastrointestinal system: Abdomen is soft, nontender, nondistended, positive bowel sounds.  No rebound.  No guarding.  Central nervous system: Alert and oriented. No focal neurological deficits. Extremities: Symmetric 5 x 5 power. Skin: No rashes, lesions or ulcers Psychiatry: Judgement and insight appear normal. Mood &  affect appropriate.     Data Reviewed: I have personally reviewed following labs and imaging studies  CBC: Recent Labs  Lab 12/17/18 0955 12/18/18 0438 12/19/18 0507 12/20/18 0425 12/21/18 0416  WBC 8.5 5.0 7.2 8.6 9.3  NEUTROABS 6.7  --   --   --   --   HGB 15.5* 12.7 13.0 12.7 11.6*  HCT 50.4* 40.7 40.9 40.0 36.5  MCV 69.2* 69.3* 68.6* 67.8* 67.7*  PLT 203 181 182 184 355   Basic Metabolic Panel: Recent Labs  Lab 12/17/18 0521 12/17/18 0955 12/18/18 0438 12/19/18 0507 12/20/18 0425 12/21/18 0416  NA  --  137 133* 135 134* 131*  K  --  3.3* 3.8 3.4* 3.9 4.3  CL  --  97* 97* 101 99 96*  CO2  --  28 26 24 29 27   GLUCOSE  --  168* 180* 150* 251* 191*  BUN  --  9 9 10 10 13   CREATININE  --  0.72 0.85 0.80 0.82 0.95  CALCIUM  --  8.8* 8.5* 8.6* 8.4* 8.5*  MG 1.6*  --  2.5* 1.8  --   --    GFR: Estimated Creatinine Clearance: 46.2 mL/min (by C-G formula based on SCr of 0.95 mg/dL). Liver Function Tests: Recent Labs  Lab 12/16/18 2022 12/17/18 0955  12/18/18 0438 12/19/18 0507  AST 32 38 86* 56*  ALT 25 24 59* 51*  ALKPHOS 48 50 56 56  BILITOT 1.7* 1.8* 1.0 1.4*  PROT 6.8 7.4 6.1* 6.4*  ALBUMIN 3.7 3.9 3.2* 3.4*   Recent Labs  Lab 12/16/18 2022  LIPASE 43   Recent Labs  Lab 12/16/18 2350  AMMONIA 13   Coagulation Profile: Recent Labs  Lab 12/17/18 0030 12/18/18 0438 12/19/18 0507 12/20/18 0425 12/21/18 0416  INR 1.54 1.58 2.41 2.63 2.60   Cardiac Enzymes: Recent Labs  Lab 12/17/18 0521 12/17/18 0955 12/17/18 1534  TROPONINI 1.15* 1.30* 1.27*   BNP (last 3 results) No results for input(s): PROBNP in the last 8760 hours. HbA1C: No results for input(s): HGBA1C in the last 72 hours. CBG: Recent Labs  Lab 12/20/18 0726 12/20/18 1144 12/20/18 1608 12/20/18 2052 12/21/18 0734  GLUCAP 187* 198* 196* 196* 179*   Lipid Profile: No results for input(s): CHOL, HDL, LDLCALC, TRIG, CHOLHDL, LDLDIRECT in the last 72 hours. Thyroid Function Tests: No results for input(s): TSH, T4TOTAL, FREET4, T3FREE, THYROIDAB in the last 72 hours. Anemia Panel: No results for input(s): VITAMINB12, FOLATE, FERRITIN, TIBC, IRON, RETICCTPCT in the last 72 hours. Sepsis Labs: No results for input(s): PROCALCITON, LATICACIDVEN in the last 168 hours.  No results found for this or any previous visit (from the past 240 hour(s)).       Radiology Studies: No results found.      Scheduled Meds: . aspirin EC  81 mg Oral Daily  . atorvastatin  20 mg Oral q1800  . bisacodyl  10 mg Rectal Daily  . feeding supplement (GLUCERNA SHAKE)  237 mL Oral Daily  . furosemide  40 mg Oral Daily  . insulin aspart  0-9 Units Subcutaneous TID WC  . insulin glargine  5 Units Subcutaneous QHS  . ivabradine  5 mg Oral BID WC  . losartan  12.5 mg Oral Daily  . metoCLOPramide  5 mg Oral TID AC & HS  . metoprolol succinate  100 mg Oral QPC breakfast  . metoprolol succinate  50 mg Oral QHS  . pantoprazole  40 mg Oral Daily  .  polyethylene  glycol  17 g Oral Daily  . potassium chloride  10 mEq Oral Daily  . sodium chloride flush  3 mL Intravenous Once  . sucralfate  1 g Oral TID WC & HS  . Warfarin - Pharmacist Dosing Inpatient   Does not apply q1800   Continuous Infusions:    LOS: 3 days    Time spent: 35 minutes    Irine Seal, MD [facility]  If 7PM-7AM, please contact night-coverage [email or web address] 12/21/2018, 12:10 PM

## 2018-12-21 NOTE — Care Management Important Message (Signed)
Important Message  Patient Details  Name: ANAISHA MAGO MRN: [id number] Date of Birth: 10-14-1956   Medicare Important Message Given:  Yes    Kerin Salen 12/21/2018, 11:03 AMImportant Message  Patient Details  Name: DENEISE GETTY MRN: [id number] Date of Birth: Oct 30, 1956   Medicare Important Message Given:  Yes    Kerin Salen 12/21/2018, 11:03 AM

## 2018-12-21 NOTE — Evaluation (Addendum)
Occupational Therapy Evaluation Patient Details Name: Kristen Lozano MRN: [id number] DOB: 03/27/1956 Today's Date: 12/21/2018    History of Present Illness 62 y.o. female with medical history significant of hypertension, hyperlipidemia, diabetes mellitus, stroke, GERD, CHF with EF of 20%, colitis, sickle cell trait, left ventricle mural thrombus on Coumadin, GERD, depression, anxiety, chronic nausea, vomiting abdominal pain, who presents with nausea, vomiting and abdominal pain.   Clinical Impression   This 62 y/o female presents with the above. Pt reports she is mod independent with ADL and using SPC for functional mobility at baseline. Pt demonstrating room level mobility using RW with overall minguard assist; she currently requires minguard assist for seated UB, toileting and standing grooming ADL, minA for LB ADL. Per chart review Pt with baseline cognitive deficits; requires intermittent safety cues this session and noted some confusion with answering therapist's questions regarding PLOF and home setup. Pt reports her daughter lives with her but no family present to confirm at time of eval. Will need to confirm prior to return home as feel pt will require 24hr supervision/assist at time of discharge. Will continue to follow acutely to progress pt's safety and independence with ADL and mobility.       Follow Up Recommendations  No OT follow up;Supervision/Assistance - 24 hour    Equipment Recommendations  Other (comment)(to be further assessed; DME needs appear to be met)           Precautions / Restrictions Precautions Precautions: Fall Restrictions Weight Bearing Restrictions: No      Mobility Bed Mobility               General bed mobility comments: OOB in recliner upon arrival  Transfers Overall transfer level: Needs assistance Equipment used: Rolling walker (2 wheeled) Transfers: Sit to/from Stand Sit to Stand: Min guard         General transfer comment: for  safety, VCs for safe hand placement and to back completely up to seated surface prior to sitting    Balance Overall balance assessment: Needs assistance         Standing balance support: Single extremity supported;Bilateral upper extremity supported;During functional activity Standing balance-Leahy Scale: Fair Standing balance comment: able to static stand with minguard for safety; use of bil UE support during mobility                           ADL either performed or assessed with clinical judgement   ADL Overall ADL's : Needs assistance/impaired Eating/Feeding: Supervision/ safety;Set up   Grooming: Min guard;Standing;Wash/dry hands   Upper Body Bathing: Min guard;Sitting   Lower Body Bathing: Min guard;Sit to/from stand   Upper Body Dressing : Set up;Sitting   Lower Body Dressing: Minimal assistance;Sit to/from stand   Toilet Transfer: Minimal assistance;Min guard;Ambulation;Regular Toilet;RW Toilet Transfer Details (indicate cue type and reason): cues to back all the way up to toilet prior to sitting Toileting- Clothing Manipulation and Hygiene: Min guard;Sit to/from stand Toileting - Clothing Manipulation Details (indicate cue type and reason): pt performing clothing management (mesh underwear and gown) with minguard for standing balance     Functional mobility during ADLs: Min guard;Rolling walker                           Pertinent Vitals/Pain Pain Assessment: No/denies pain     Hand Dominance Right   Extremity/Trunk Assessment Upper Extremity Assessment Upper Extremity Assessment: Generalized weakness     Lower Extremity Assessment Lower Extremity Assessment: Defer to PT evaluation       Communication Communication Communication: No difficulties   Cognition Arousal/Alertness: Awake/alert Behavior During Therapy: WFL for tasks assessed/performed;Flat affect Overall Cognitive Status: History of cognitive impairments - at baseline                                  General Comments: per chart review pt with baseline cognitive impairments; pt noted with slower processing, requires safety cues, intermittently requires repetition of questions. pt oriented to month, requires multiple choice to state she is in the hospital but once given options does so correctly   General Comments       Exercises     Shoulder Instructions      Home Living Family/patient expects to be discharged to:: Private residence Living Arrangements: Spouse/significant other;Children Available Help at Discharge: Family Type of Home: House Home Access: Stairs to enter Technical brewer of Steps: 1 Entrance Stairs-Rails: Right Home Layout: One level     Bathroom Shower/Tub: Tub/shower unit         [place]: [place] - single point;Shower seat;Walker - 2 wheels   Additional Comments: some homesetup obtained from previous admission due to pt's cognitive deficits       Prior Functioning/Environment Level of Independence: Needs assistance  Gait / Transfers Assistance Needed: pt reports she uses [facility] for mobility     Comments: unsure of pt accuracy with information provided         OT Problem List: Decreased strength;Decreased range of motion;Decreased activity tolerance;Decreased cognition;Decreased safety awareness      OT Treatment/Interventions: Self-care/ADL training;Therapeutic exercise;DME and/or AE instruction;Therapeutic activities;Patient/family education;Balance training    OT Goals(Current goals can be found in the care plan section) Acute Rehab OT Goals Patient Stated Goal: none stated OT Goal Formulation: With patient Time For Goal Achievement: 01/04/19 Potential to Achieve Goals: Good  OT Frequency: Min 2X/week   Barriers to D/C:            Co-evaluation              AM-PAC OT "6 Clicks" Daily Activity     Outcome Measure Help from another person eating meals?: None Help from another  person taking care of personal grooming?: None Help from another person toileting, which includes using toliet, bedpan, or urinal?: A Little Help from another person bathing (including washing, rinsing, drying)?: A Little Help from another person to put on and taking off regular upper body clothing?: None Help from another person to put on and taking off regular lower body clothing?: A Little 6 Click Score: 21   End of Session Equipment Utilized During Treatment: Gait belt;Rolling walker Nurse Communication: Mobility status  Activity Tolerance: Patient tolerated treatment well Patient left: in chair;with call bell/phone within reach;with chair alarm set  OT Visit Diagnosis: Muscle weakness (generalized) (M62.81)                Time: 4010-2725 OT Time Calculation (min): 15 min Charges:  OT General Charges $OT Visit: 1 Visit OT Evaluation $OT Eval Moderate Complexity: 1 Mod  Lou Cal, OT [employer] Pager [phone] Office [phone]   Raymondo Band 12/21/2018, 10:20 AM

## 2018-12-21 NOTE — Evaluation (Signed)
Physical Therapy Evaluation Patient Details Name: ROCHELLA BENNER MRN: [id number] DOB: 25-May-1956 Today's Date: 12/21/2018   History of Present Illness  63 y.o. female with medical history significant of hypertension, hyperlipidemia, diabetes mellitus, stroke, GERD, CHF with EF of 20%, colitis, sickle cell trait, left ventricle mural thrombus on Coumadin, GERD, depression, anxiety, chronic nausea, vomiting abdominal pain, who presents with nausea, vomiting and abdominal pain.  Clinical Impression  Pt ambulated 200' with RW with no loss of balance, HR 110 while walking. She appears to be at baseline of modified independent with mobility, no further PT indicated, will sign off.     Follow Up Recommendations No PT follow up    Equipment Recommendations  None recommended by PT    Recommendations for Other Services       Precautions / Restrictions Precautions Precautions: Fall Restrictions Weight Bearing Restrictions: No      Mobility  Bed Mobility               General bed mobility comments: OOB in recliner upon arrival  Transfers Overall transfer level: Needs assistance Equipment used: Rolling walker (2 wheeled) Transfers: Sit to/from Stand Sit to Stand: Supervision         General transfer comment: supervision for safety  Ambulation/Gait Ambulation/Gait assistance: Supervision Gait Distance (Feet): 200 Feet Assistive device: Rolling walker (2 wheeled) Gait Pattern/deviations: WFL(Within Functional Limits) Gait velocity: WNL   General Gait Details: steady with RW, no loss of balance, HR 110 walking, no dyspnea  Stairs            Wheelchair Mobility    Modified Rankin (Stroke Patients Only)       Balance Overall balance assessment: Needs assistance         Standing balance support: Single extremity supported;Bilateral upper extremity supported;During functional activity Standing balance-Leahy Scale: Fair Standing balance comment: able to  static stand with minguard for safety; use of bil UE support during mobility                             Pertinent Vitals/Pain Pain Assessment: No/denies pain    Home Living Family/patient expects to be discharged to:: Private residence Living Arrangements: Spouse/significant other;Children Available Help at Discharge: Family;Available 24 hours/day(lives with aunt "Boo") Type of Home: House Home Access: Stairs to enter Entrance Stairs-Rails: Right Entrance Stairs-Number of Steps: 1 Home Layout: One level Home Equipment: Cane - single point;Shower seat;Walker - 2 wheels Additional Comments: some home setup obtained from previous admission due to pt's cognitive deficits     Prior Function Level of Independence: Needs assistance   Gait / Transfers Assistance Needed: pt reports she uses SPC or RW for mobility  ADL's / Homemaking Assistance Needed: Reports indep with most ADLs. Ex-husband assists with household tasks and manages medications        Hand Dominance   Dominant Hand: Right    Extremity/Trunk Assessment   Upper Extremity Assessment Upper Extremity Assessment: Defer to OT evaluation    Lower Extremity Assessment Lower Extremity Assessment: Generalized weakness(B knee ext -4/5)    Cervical / Trunk Assessment Cervical / Trunk Assessment: Normal  Communication   Communication: No difficulties  Cognition Arousal/Alertness: Awake/alert Behavior During Therapy: WFL for tasks assessed/performed;Flat affect Overall Cognitive Status: History of cognitive impairments - at baseline  General Comments: per chart review pt with baseline cognitive impairments; pt noted with slower processing, requires safety cues, intermittently requires repetition of questions. pt oriented to month, requires multiple choice to state she is in the hospital but once given options does so correctly      General Comments       Exercises     Assessment/Plan    PT Assessment Patent does not need any further PT services  PT Problem List         PT Treatment Interventions      PT Goals (Current goals can be found in the Care Plan section)  Acute Rehab PT Goals Patient Stated Goal: none stated PT Goal Formulation: All assessment and education complete, DC therapy    Frequency     Barriers to discharge        Co-evaluation               AM-PAC PT "6 Clicks" Mobility  Outcome Measure Help needed turning from your back to your side while in a flat bed without using bedrails?: None Help needed moving from lying on your back to sitting on the side of a flat bed without using bedrails?: A Little Help needed moving to and from a bed to a chair (including a wheelchair)?: None Help needed standing up from a chair using your arms (e.g., wheelchair or bedside chair)?: None Help needed to walk in hospital room?: None Help needed climbing 3-5 steps with a railing? : A Little 6 Click Score: 22    End of Session Equipment Utilized During Treatment: Gait belt Activity Tolerance: Patient tolerated treatment well Patient left: in chair;with call bell/phone within reach;with chair alarm set Nurse Communication: Mobility status PT Visit Diagnosis: Muscle weakness (generalized) (M62.81)    Time: 1694-5038 PT Time Calculation (min) (ACUTE ONLY): 11 min   Charges:   PT Evaluation $PT Eval Low Complexity: 1 Low          Philomena Doheny PT 12/21/2018  Acute Rehabilitation Services Pager [phone] Office [phone]

## 2018-12-22 ENCOUNTER — Telehealth: Payer: Self-pay | Admitting: Internal Medicine

## 2018-12-22 ENCOUNTER — Other Ambulatory Visit: Payer: Self-pay | Admitting: Pharmacist Clinician (PhC)/ Clinical Pharmacy Specialist

## 2018-12-22 LAB — BASIC METABOLIC PANEL
Anion gap: 10 (ref 5–15)
BUN: 15 mg/dL (ref 8–23)
CO2: 25 mmol/L (ref 22–32)
Calcium: 8.5 mg/dL — ABNORMAL LOW (ref 8.9–10.3)
Chloride: 96 mmol/L — ABNORMAL LOW (ref 98–111)
Creatinine, Ser: 0.81 mg/dL (ref 0.44–1.00)
GFR calc non Af Amer: >60 mL/min (ref >60)
Glucose, Bld: 206 mg/dL — ABNORMAL HIGH (ref 70–99)
Potassium: 4.4 mmol/L (ref 3.5–5.1)
Sodium: 131 mmol/L — ABNORMAL LOW (ref 135–145)

## 2018-12-22 LAB — CBC
HCT: 38.1 % (ref 36.0–46.0)
HEMOGLOBIN: 12.1 g/dL (ref 12.0–15.0)
MCH: 21.7 pg — ABNORMAL LOW (ref 26.0–34.0)
MCHC: 31.8 g/dL (ref 30.0–36.0)
MCV: 68.3 fL — ABNORMAL LOW (ref 80.0–100.0)
Platelets: 216 10*3/uL (ref 150–400)
RBC: 5.58 MIL/uL — AB (ref 3.87–5.11)
RDW: 17 % — ABNORMAL HIGH (ref 11.5–15.5)
WBC: 8.6 10*3/uL (ref 4.0–10.5)
nRBC: 0 % (ref 0.0–0.2)

## 2018-12-22 LAB — GLUCOSE, CAPILLARY
GLUCOSE-CAPILLARY: 193 mg/dL — AB (ref 70–99)
GLUCOSE-CAPILLARY: 207 mg/dL — AB (ref 70–99)
Glucose-Capillary: 150 mg/dL — ABNORMAL HIGH (ref 70–99)

## 2018-12-22 LAB — PROTIME-INR
INR: 2.4
Prothrombin Time: 25.8 seconds — ABNORMAL HIGH (ref 11.4–15.2)

## 2018-12-22 MED ORDER — PROCHLORPERAZINE MALEATE 5 MG PO TABS: 5.0000 mg | ORAL_TABLET | Freq: Three times a day (TID) | ORAL | 0 refills | Status: DC

## 2018-12-22 MED ORDER — PROCHLORPERAZINE MALEATE 10 MG PO TABS
5.0000 mg | ORAL_TABLET | Freq: Three times a day (TID) | ORAL | Status: DC
  Administered 2018-12-22 (×3): 5 mg via ORAL
  Filled 2018-12-22 (×3): qty 1

## 2018-12-22 MED ORDER — SORBITOL 70 % SOLN
30.0000 mL | Freq: Once | Status: AC
  Administered 2018-12-22: 30 mL via ORAL
  Filled 2018-12-22: qty 30

## 2018-12-22 MED ORDER — IVABRADINE HCL 5 MG PO TABS: 5.0000 mg | ORAL_TABLET | Freq: Two times a day (BID) | ORAL | 0 refills | Status: AC

## 2018-12-22 MED ORDER — INSULIN GLARGINE 100 UNIT/ML ~~LOC~~ SOLN
8.0000 [IU] | Freq: Every day | SUBCUTANEOUS | Status: DC
  Filled 2018-12-22: qty 0.08

## 2018-12-22 MED ORDER — BISACODYL 10 MG RE SUPP: 10.0000 mg | Freq: Every day | RECTAL | 0 refills | Status: DC | PRN

## 2018-12-22 MED ORDER — WARFARIN SODIUM 5 MG PO TABS
7.5000 mg | ORAL_TABLET | Freq: Once | ORAL | Status: AC
  Administered 2018-12-22: 7.5 mg via ORAL
  Filled 2018-12-22: qty 1

## 2018-12-22 MED ORDER — POLYETHYLENE GLYCOL 3350 17 G PO PACK: 17.0000 g | PACK | Freq: Every day | ORAL | 0 refills | Status: AC | PRN

## 2018-12-22 NOTE — Progress Notes (Signed)
ANTICOAGULATION CONSULT NOTE - Follow Up Consult  Pharmacy Consult for warfarin Indication: hx CVA and LV thrombus  No Known Allergies  Patient Measurements: Height: 5\' 3"  (160 cm) Weight: 104 lb 3.2 oz (47.3 kg) IBW/kg (Calculated) : 52.4 Heparin Dosing Weight:   Vital Signs: Temp: 97.4 F (36.3 C) (01/28 0514) Temp Source: Oral (01/28 0514) BP: 136/70 (01/28 0840) Pulse Rate: 110 (01/28 0840)  Labs: Recent Labs    12/20/18 0425 12/21/18 0416 12/22/18 0425  HGB 12.7 11.6* 12.1  HCT 40.0 36.5 38.1  PLT 184 187 216  LABPROT 27.7* 27.5* 25.8*  INR 2.63 2.60 2.40  CREATININE 0.82 0.95 0.81    Estimated Creatinine Clearance: 53.1 mL/min (by C-G formula based on SCr of 0.81 mg/dL).   Medications:  PTA warfarin regimen: 5 mg daily except 7.5 mg on Mon and Fri  Assessment: Patient's a 63 y.o F with hx CVA and LV mural thrombus on warfarin PTA, presented to the ED on 12/17/18 with c/o n/v and abd pain.  Warfarin resumed on admission and pt was started on heparin d/t sub-therapeutic INR.  Heparin drip d/ced on 1/25 d/t therapeutic INR.  Today, 12/22/2018: - INR is therapeutic at 2.40. Warfarin dose NOT given on 1/27 - cbc stable - no signif. drug-drug intxns - 50-100% meals charted  Goal of Therapy:  INR 2-3 Monitor platelets by anticoagulation protocol: Yes   Plan:  - Warfarin 7.5 mg PO x1 - monitor for s/s bleeding  Justis Dupas P 12/22/2018,10:29 AM

## 2018-12-22 NOTE — Progress Notes (Signed)
Progress Note  Patient Name: Kristen Lozano Date of Encounter: 12/22/2018  Primary Cardiologist: Pixie Casino, MD   Subjective   No chest pain or SOB. Tolerating the ivabradine well. No more diarrhea, no abdominal pain. Ate a good breakfast today.   Inpatient Medications    Scheduled Meds: . aspirin EC  81 mg Oral Daily  . atorvastatin  20 mg Oral q1800  . bisacodyl  10 mg Rectal Daily  . feeding supplement (GLUCERNA SHAKE)  237 mL Oral Daily  . furosemide  40 mg Oral Daily  . insulin aspart  0-9 Units Subcutaneous TID WC  . insulin glargine  8 Units Subcutaneous QHS  . ivabradine  5 mg Oral BID WC  . losartan  12.5 mg Oral Daily  . metoprolol succinate  100 mg Oral QPC breakfast  . metoprolol succinate  50 mg Oral QHS  . pantoprazole  40 mg Oral Daily  . polyethylene glycol  17 g Oral Daily  . potassium chloride  10 mEq Oral Daily  . prochlorperazine  5 mg Oral TID AC & HS  . sodium chloride flush  3 mL Intravenous Once  . sucralfate  1 g Oral TID WC & HS  . warfarin  5 mg Oral ONCE-1800  . Warfarin - Pharmacist Dosing Inpatient   Does not apply q1800   Continuous Infusions:  PRN Meds: acetaminophen, guaiFENesin, hydrALAZINE, morphine injection, nitroGLYCERIN, ondansetron, zolpidem   Vital Signs    Vitals:   12/21/18 0528 12/21/18 1529 12/21/18 2148 12/22/18 0514  BP: 103/79 90/66 101/75 108/77  Pulse: (!) 105 100 (!) 110 (!) 107  Resp: 18 17 16 14   Temp: 99.2 F (37.3 C) 97.9 F (36.6 C) 97.9 F (36.6 C) (!) 97.4 F (36.3 C)  TempSrc: Oral Oral Oral Oral  SpO2: 100% 100% 100% 98%  Weight: 48.3 kg   47.3 kg  Height:        Intake/Output Summary (Last 24 hours) at 12/22/2018 0844 Last data filed at 12/22/2018 0200 Gross per 24 hour  Intake 1160 ml  Output 700 ml  Net 460 ml   Last 3 Weights 12/22/2018 12/21/2018 12/18/2018  Weight (lbs) 104 lb 3.2 oz 106 lb 7.7 oz 106 lb 7.7 oz  Weight (kg) 47.265 kg 48.3 kg 48.3 kg      Telemetry    Mostly ST,  frequent PACs, occ PVCs and pairs - Personally Reviewed  ECG    None today  Physical Exam   General: Well developed, thin, frail, female in no acute distress Head: Eyes PERRLA, No xanthomas.   Normocephalic and atraumatic. Poor dentition. Lungs: decreased BS bases but good air exchange. Heart: HRRR S1 S2, without MRG.  Pulses are 2+ & equal. No JVD. Abdomen: Bowel sounds are present, abdomen soft and non-tender without masses or  hernias noted. Msk: Normal strength and tone for age. Extremities: No clubbing, cyanosis or edema.    Skin:  No rashes or lesions noted. Neuro: Alert and oriented X 3. Psych:  Good affect, responds appropriately  Labs    Chemistry Recent Labs  Lab 12/17/18 0955 12/18/18 0438 12/19/18 0507 12/20/18 0425 12/21/18 0416 12/22/18 0425  NA 137 133* 135 134* 131* 131*  K 3.3* 3.8 3.4* 3.9 4.3 4.4  CL 97* 97* 101 99 96* 96*  CO2 28 26 24 29 27 25   GLUCOSE 168* 180* 150* 251* 191* 206*  BUN 9 9 10 10 13 15   CREATININE 0.72 0.85 0.80 0.82 0.95 0.81  CALCIUM 8.8* 8.5* 8.6* 8.4* 8.5* 8.5*  PROT 7.4 6.1* 6.4*  --   --   --   ALBUMIN 3.9 3.2* 3.4*  --   --   --   AST 38 86* 56*  --   --   --   ALT 24 59* 51*  --   --   --   ALKPHOS 50 56 56  --   --   --   BILITOT 1.8* 1.0 1.4*  --   --   --   GFRNONAA >60 >60 >60 >60 >60 >60  GFRAA >60 >60 >60 >60 >60 >60  ANIONGAP 12 10 10 6 8 10      Hematology Recent Labs  Lab 12/20/18 0425 12/21/18 0416 12/22/18 0425  WBC 8.6 9.3 8.6  RBC 5.90* 5.39* 5.58*  HGB 12.7 11.6* 12.1  HCT 40.0 36.5 38.1  MCV 67.8* 67.7* 68.3*  MCH 21.5* 21.5* 21.7*  MCHC 31.8 31.8 31.8  RDW 17.2* 16.5* 17.0*  PLT 184 187 216    Cardiac Enzymes Recent Labs  Lab 12/17/18 0521 12/17/18 0955 12/17/18 1534  TROPONINI 1.15* 1.30* 1.27*    Recent Labs  Lab 12/17/18 0001 12/17/18 0323  TROPIPOC 0.54* 0.74*     BNP Recent Labs  Lab 12/16/18 2350  BNP 1,200.8*    Lab Results  Component Value Date   INR 2.40  12/22/2018   INR 2.60 12/21/2018   INR 2.63 12/20/2018    Radiology    No results found.  Cardiac Studies   ECHO: 12/17/2018 - Left ventricle: The cavity size was moderately dilated. Systolic   function was severely reduced. The estimated ejection fraction   was in the range of 20% to 25%. Slightly overestimated with 2D   speckle tracking. Diffuse hypokinesis. Although no diagnostic   regional wall motion abnormality was identified, this possibility   cannot be completely excluded on the basis of this study.   Features are consistent with a pseudonormal left ventricular   filling pattern, with concomitant abnormal relaxation and   increased filling pressure (grade 2 diastolic dysfunction). - Ventricular septum: Septal motion showed abnormal function and   dyssynergy. - Aortic valve: There was trivial regurgitation. - Mitral valve: There was moderate regurgitation. - Left atrium: The atrium was mildly dilated. - Tricuspid valve: There was mild-moderate regurgitation. - Pulmonary arteries: PA peak pressure: 42 mm Hg (S). - Pericardium, extracardiac: A trivial pericardial effusion was   identified. There was a left pleural effusion. Impressions: - No significant change from prior.  CARDIAC CATH: 10/14/2018  Mid RCA lesion is 15% stenosed.   No significant coronary obstructive disease with minimal 10 to 20% smooth narrowing in the mid RCA and otherwise normal LAD and left circumflex vessel.  Mild right heart pressure elevation with mild pulmonary hypertension with a PA mean pressure of 26 mm.  LVEDP 16 mm.  Recent echo Doppler with EF 20 to 25% and with patient's history of LV thrombus, left ventricular thrombus was not done.  RECOMMENDATION: Optimal guideline medical therapy for heart failure with reduced EF.  If blood pressure allows, consider Entresto.  With increased heart rate consider a ivabradine. Recommend resumption of anticoagulation with warfarin  tomorrow.  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 She is been unable to take all of her medications for several months because of nausea and vomiting.  Admitted 1/23 for nausea, vomiting, abdominal pain  Assessment & Plan    1. Chronic  combined systolic and diastolic HF -Recent diagnosis of nonischemic cardiomyopathy with EF 19-62% grade 2 diastolic dysfunction by echo this admission -Got 1 dose of Lasix 40 mg IV on admission, since then has been on 40 mg p.o. daily -Was not taking ivabradine prior to admission because insurance would not cover. Nehemiah Massed, Pharm.D. is looking into this. Restarted ivabradine 5 mg twice daily on 1/27 -Continue home meds of Lasix 40 mg daily, losartan 12.5 mg daily and Toprol-XL 100 mg a.m. and 50 mg p.m.  2. LV thrombus - INR subtherapeutic on admission at 1.54 - She was on heparin until her INR was therapeutic -Continue Coumadin as outpatient  3. Troponin elevation -No chest pain - Cath 09/2018 with 30% RCA, no further ischemic evaluation indicated unless she has new ischemic ECG changes   Will make follow-up appointment For questions or updates, please contact [place] Please consult [email or web address] for contact info under      Signed, Rosaria Ferries, PA-C  12/22/2018, 8:44 AM

## 2018-12-22 NOTE — Progress Notes (Signed)
Pt had 4 large soft formed stools, specimen sent as ordered. Refused AM Miralax and Dulcolax suppository. SRP, RN

## 2018-12-22 NOTE — Discharge Summary (Signed)
Physician Discharge Summary  Kristen Lozano PPJ:[id number] DOB: 02-Apr-1956 DOA: 12/16/2018  PCP: Kristen Hose, MD  Admit date: 12/16/2018 Discharge date: 12/22/2018  Time spent: 60 minutes  Recommendations for Outpatient Follow-up:  1. Follow-up with Kristen. Debara Lozano, cardiology 01/14/2019.  On follow-up patient will need a basic metabolic profile done to follow-up on electrolytes and renal function. 2. Follow-up with Lozano, Kristen B, MD in 2 to 3 weeks.  On follow-up patient's abdominal pain nausea and vomiting will need to be reassessed.  GI pathogen panel and stool for ova and parasites which were pending at time of discharge will need to be followed up upon.   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 female with medical history significant of hypertension, hyperlipidemia, diabetes mellitus, stroke, GERD, CHF with EF of 20%, colitis, sickle cell trait, left ventricle mural thrombus on Coumadin, GERD, depression, anxiety, chronic nausea, vomiting abdominal pain, who presented with nausea, vomiting and abdominal pain.  Patient has chronic nausea, vomiting, abdominal pain.  Has multiple ED visit and recent admission from 12/28-12/31.  Per discharge summary, "work-up done in the past including EGD and gastric emptying study  without clear diagnosis. CT of the abdomen was done in November showed some liver congestion. KUB without any stool burden".  Per her daughter, patient continued to have nausea, vomiting and abdominal pain intermittently.  She has vomited several times with nonbilious, nonbloody vomitus.  Her abdominal pain is located in the central abdomen, intermittent, sharp, nonradiating.  Patient does not have diarrhea.  Patient has mild cough.  She is drowsy and has some mild confusion, but is still orientated x3 when I saw patient in ED. She is not sure whether she has any chest pain.  No unilateral weakness.  No facial droop or slurred speech.  Denies symptoms of UTI.  ED Course: pt was found to have WBC 10.3, BNP 1200, INR 1.54, PTT 28, troponin 0.57-->0.74, lipase 43, negative urinalysis, potassium 3.3, creatinine and BUN normal, ammonia 13, temperature normal, heart rate 42- 116, no tachypnea, oxygen saturation 97% on room air.  CT head is negative for acute intracranial abnormalities, but showed bilateral MCA infarction.  MR venogram is negative.  Patient is placed on telemetry bed for observation.   Hospital Course:  Abdominal pain,nausea, vomiting: No diarrhea. This is a ?? chronic issue.??  ??  Secondary to gut edema/idiopathic dilated cardiomyopathy with a EF of 20 to 25% plus or minus secondary to constipation. Patient had extensive work-up in the past without clear diagnosis made.  Patient has had a HIDA scan which was unremarkable, CT abdomen and pelvis which was unremarkable, upper endoscopy which was unremarkable, right upper quadrant ultrasound.  Repeat LFTs.Lipase normal on admission.  Patient was given an enema, Dulcolax suppositories daily and Reglan changed to before meals and  at bedtime.  Patient had some significant bowel movements and now tolerating oral intake.  Diet was advanced to a soft diet which patient has tolerated.  No further bouts of nausea or emesis over the past 24 hours.     Reglan was subsequently changed to Compazine before meals and at bedtime which patient tolerated.  Patient also maintained on Dulcolax suppositories daily.  Patient subsequently started on MiraLAX.  Patient started on ivabradine per cardiology.  Patient's husband was insistent on stools to be checked for parasites and as such GI pathogen panel and stool for ova and parasites were ordered and were pending on day of discharge.  Patient was started back on her cardiac medications.  Patient be discharged home on Compazine before meals x1 week and then subsequently as needed.  Patient is also to resume her home cardiac regimen as well as ivabradine which was started per cardiology.  Outpatient follow-up with PCP and cardiology.  Idiopathic dilated cardiomyopathy Patient noted to have idiopathic dilated cardiomyopathy.  2D echo with a EF of 20 to 25% with diffuse hypokinesis and ventricular septal abnormal function with dyssynergy unchanged from prior 2D echo.  Patient has been seen by cardiology and is noted per their note that patient's dilated cardiomyopathy difficult to treat as patient unable to take her medications due to loss of nausea and vomiting and cannot keep her medications down.    Patient was placed on home regimen of Cozaar and Lasix initially.  Cardiology consulted.  Cardiology recommended to resume oral Toprol-XL, losartan and furosemide when patient was taking p.o. well.  Patient improved clinically and subsequently started to tolerate oral intake and as such patient's home dose Toprol-XL has been resumed and her cardiac medications.  IV Lopressor has been discontinued once Toprol was resumed with good oral intake.  INR was therapeutic and as such IV heparin discontinued and patient maintained on Coumadin which he tolerated.  Ivabradine was subsequently started per cardiology.  Cardiology followed the patient throughout the hospitalization.  Outpatient follow-up with cardiology.  Elevated  troponin:Troponin 0.57-->0.74 >>> 1.15>>>> 1.30>>> 1.27. Patient denied any chest pain. Patient on IV heparin on admission due to elevated troponins and subtherapeutic INR.  Fasting lipid panel with LDL of 33.  2D echo with EF of 20 to 25%, diffuse hypokinesis, trivial aortic valvular regurgitation, ventricular septum with abnormal function dyssynergy, mildly dilated left atrium, no significant change from prior 2D echo.  Patient presented with nausea emesis and abdominal pain which has been ongoing for several months.  Patient was unable to keep most of her medications down.  Patient seen in consultation by cardiology who feel patient's elevated troponin most likely due to demand ischemia in the setting of his severe LV dysfunction and tachycardia.  Patient noted not to be a good candidate for invasive procedures at this point with nausea vomiting and poor oral intake.  Per cardiology patient may be a candidate for heart catheterization when she is eating better, stronger and tolerating cardiac medications.  Patient improved clinically and was tolerating oral intake and diet advanced to a soft diet.  Patient initially was on IV Lopressor which was subsequently changed back to home dose oral Toprol once patient was tolerating diet.  Patient also continued on home regimen of Cozaar, statin, aspirin. Patient has been started on ivabradine per cardiology.  Outpatient follow-up with cardiology.  LV (left ventricular) mural thrombus following MI: on coumadin, INR= 2.60 -On admission patient was noted to have a subtherapeutic INR felt likely secondary  to poor oral intake due to nausea vomiting abdominal pain and inability of patient to keep down the Coumadin.  Patient initially placed on IV heparin per pharmacy and as patient's oral intake improved Coumadin resumed.  INR subsequently became therapeutic and IV heparin discontinued.  Was 2.40 by day of discharge.  Outpatient follow-up.   Acute metabolic  encephalopathy: pt is stilloriented x3. CT head is negative for acute intracranial abnormalities MR venogram is negative.   Patient likely close to baseline as patient with prior history of CVA and per husband patient with some bouts of confusion which is chronic.   HTN:  -Oral Toprol was changed to IV Lopressor and dose increased to 7.5 mg every 6 hours as patient noted to have nausea and emesis with poor oral intake initially during the hospitalization on presentation.  Patient was also maintained on oral Cozaar.  Patient improved clinically and as diet was advanced and oral intake improved IV Lopressor was subsequently changed back to home dose oral Toprol.  Cardiology followed patient throughout the hospitalization.  Stroke [facility]): -on ASA, and lipitor.  Patient also maintained on home regimen Coumadin.  Chronic combined systolic and diastolic CHF (congestive heart failure) (HCC):BNP 1200 noted on admission, patient clinically looks euvolemic on examination on admission.  Patient with positive JVD during the hospitalization..  No lower extremity edema.  No crackles noted on examination. Patient was given a dose of IV Lasix on admission.  Patient with poor oral intake.  Urine output over the past 24 hours not properly recorded. No acute respiratory distress.  Patient with improvement with nausea and vomiting and subsequently started tolerating oral intake.  Patient's diet was advanced to a soft diet which he tolerated.  Patient's cardiac medications of Lipitor, aspirin, and Cozaar were continued.  Patient was initially placed on IV Lopressor on presentation due to nausea vomiting and poor oral intake well home dose Toprol was held.  As patient's nausea vomiting and oral intake improved IV Lopressor was discontinued and patient resumed back on home regimen of oral Toprol.  Patient's oral Lasix was also resumed which he tolerated.  Cardiology was consulted and followed the patient throughout the  hospitalization.  Patient was started back on ivabradine per cardiology which he tolerated.  Outpatient follow-up with cardiology.  Hypokalemiaandhypomagnesemia: K=3.3 and Mg 1.6on admission. -Electrolytes were repleted by day of discharge.   Type 2 diabetes mellitus with vascular disease (HCC):Last A1c8.3 on 11/22/18. Patient was takingmetformin, Lantusat home.  Patient with nausea and emesis and poor oral intake initially on admission.  Patient's Lantus dose was decreased to 5 units daily initially on admission and patient maintained on a sliding scale insulin.  Oral hypoglycemic agents were held.  As patient improved clinically and oral intake improved Lantus dose was adjusted to 8 units daily.  Patient be discharged home on home regimen of Lantus and metformin.  Outpatient follow-up with PCP.   HLD (hyperlipidemia) -Once patient's nausea and vomiting improved and patient was tolerating a diet patient was maintained on home regimen of statin.  Outpatient follow-up with cardiology.    Severe protein calorie malnutrition Likely secondary to poor oral intake due to ongoing abdominal pain nausea and vomiting and inability to keep anything down.  Patient improved clinically during the hospitalization and diet was subsequently advanced to a soft diet and tolerating oral intake.  No nausea or vomiting.  Denied any further abdominal pain.    Patient initially maintained on Reglan before meals and at bedtime as well as  Dulcolax suppositories.  MiraLAX was also added.  Reglan discontinued and patient placed on Compazine before meals and at bedtime with continued clinical improvement.  Patient will be discharged home on Compazine before her meals as well as a bowel regimen.  Outpatient follow-up.    Procedures:  2D echo 12/17/2018  CT head without contrast 12/17/2018  Consultations:  Cardiology: Kristen. Acie Fredrickson 12/17/2018  Discharge Exam: Vitals:   12/22/18 1245 12/22/18 1352  BP: 98/72  107/66  Pulse: (!) 106 (!) 102  Resp: 16   Temp: 97.9 F (36.6 C)   SpO2: 97%     General: Frail. NAD. Cachetic Cardiovascular: Regular rate rhythm no murmurs rubs or gallops. Respiratory: Clear to auscultation bilaterally.  No wheezes, no crackles, no rhonchi.  Discharge Instructions   Discharge Instructions    Diet - low sodium heart healthy   Complete by:  As directed    Increase activity slowly   Complete by:  As directed      Allergies as of 12/22/2018   No Known Allergies     Medication List    STOP taking these medications   metoCLOPramide 5 MG tablet Commonly known as:  REGLAN   ondansetron 8 MG disintegrating tablet Commonly known as:  ZOFRAN ODT   promethazine 12.5 MG tablet Commonly known as:  PHENERGAN     TAKE these medications   Acetaminophen 500 MG coapsule Take 500 mg by mouth daily as needed for mild pain or headache.   ASPIRIN LOW DOSE 81 MG EC tablet Generic drug:  aspirin Take 1 tablet (81 mg total) by mouth daily. What changed:  See the new instructions.   atorvastatin 20 MG tablet Commonly known as:  LIPITOR Take 1 tablet (20 mg total) by mouth daily at 6 PM. Please schedule appointment for refills.   bisacodyl 10 MG suppository Commonly known as:  DULCOLAX Place 1 suppository (10 mg total) rectally daily as needed for moderate constipation.   docusate sodium 100 MG capsule Commonly known as:  COLACE Take 1 capsule (100 mg total) by mouth 2 (two) times daily as needed for mild constipation.   furosemide 40 MG tablet Commonly known as:  LASIX Take 1 tablet (40 mg total) by mouth daily.   GLUCERNA Liqd Take 237 mLs by mouth daily. What changed:  Another medication with the same name was removed. Continue taking this medication, and follow the directions you see here.   guaiFENesin 100 MG/5ML Soln Commonly known as:  ROBITUSSIN Take 15 mLs (300 mg total) by mouth 4 (four) times daily. What changed:    when to take  this  reasons to take this   ivabradine 5 MG Tabs tablet Commonly known as:  CORLANOR Take 1 tablet (5 mg total) by mouth 2 (two) times daily with a meal.   LANTUS SOLOSTAR 100 UNIT/ML Solostar Pen Generic drug:  Insulin Glargine Inject 10 Units into the skin at bedtime. What changed:  Another medication with the same name was removed. Continue taking this medication, and follow the directions you see here.   losartan 25 MG tablet Commonly known as:  COZAAR Take 0.5 tablets (12.5 mg total) by mouth daily.   metFORMIN 500 MG 24 hr tablet Commonly known as:  GLUCOPHAGE-XR Take 2 tablets (1,000 mg total) by mouth 2 (two) times daily.   metoprolol succinate 50 MG 24 hr tablet Commonly known as:  TOPROL-XL Take 1 tablet (50 mg total) by mouth at bedtime. Take with or immediately following a meal. What  changed:  Another medication with the same name was changed. Make sure you understand how and when to take each.   metoprolol succinate 100 MG 24 hr tablet Commonly known as:  TOPROL-XL Take 1 tablet (100 mg total) by mouth daily. Take with or immediately following a meal.  In the Morning What changed:  when to take this   oxyCODONE 5 MG immediate release tablet Commonly known as:  Oxy IR/ROXICODONE Take 1 tablet (5 mg total) by mouth every 6 (six) hours as needed for moderate pain.   pantoprazole 40 MG tablet Commonly known as:  PROTONIX Take 1 tablet (40 mg total) by mouth daily with supper.   polyethylene glycol packet Commonly known as:  MIRALAX / GLYCOLAX Take 17 g by mouth daily as needed.   potassium chloride 10 MEQ tablet Commonly known as:  K-DUR Take 1 tablet (10 mEq total) by mouth daily.   prochlorperazine 5 MG tablet Commonly known as:  COMPAZINE Take 1 tablet (5 mg total) by mouth 3 (three) times daily before meals. Take 1 tablet 3 times daily before meals x1 week and then every 6 hours as needed.   sucralfate 1 GM/10ML suspension Commonly known as:   CARAFATE Take 10 mLs (1 g total) by mouth 4 (four) times daily -  with meals and at bedtime for 10 days.   warfarin 5 MG tablet Commonly known as:  COUMADIN Take as directed. If you are unsure how to take this medication, talk to your nurse or doctor. Original instructions:  Take 1 to 1.5 tablets by mouth daily as directed by coumadin clinic  Take 1.5 pills on Mondays and Fridays and 1 pill Tuesday, Wed. THursday, Saturday and Sunday What changed:    how much to take  how to take this  when to take this  additional instructions      No Known Allergies Follow-up Information    Pixie Casino, MD Follow up on 01/14/2019.   Specialty:  Cardiology Why:  Please arrive at 9:30 am for a 9:45 am appt. Contact information: [place] [id number] [phone]        Kristen Hose, MD. Schedule an appointment as soon as possible for a visit in 2 week(s).   Specialty:  Internal Medicine Why:  f/u in 2-3 weeks. Contact information: [place] [address] [phone]            The results of significant diagnostics from this hospitalization (including imaging, microbiology, ancillary and laboratory) are listed below for reference.    Significant Diagnostic Studies: Dg Abd 1 View  Result Date: 11/23/2018 CLINICAL DATA:  Abdominal pain, nausea, vomiting EXAM: ABDOMEN - 1 VIEW COMPARISON:  None. FINDINGS: The bowel gas pattern is normal. No radio-opaque calculi or other significant radiographic abnormality are seen. IMPRESSION: Negative. Electronically Signed   By: Kathreen Devoid   On: 11/23/2018 10:06   Ct Head Wo Contrast  Result Date: 12/17/2018 CLINICAL DATA:  63 year old female with altered mental status. EXAM: CT HEAD WITHOUT CONTRAST TECHNIQUE: Contiguous axial images were obtained from the base of the skull through the vertex without intravenous contrast. COMPARISON:  Head CT dated 10/25/2018 and MRI dated  10/26/2018 FINDINGS: Brain: The ventricles and sulci appropriate size for patient's age. There is mild chronic microvascular ischemic changes. Bilateral MCA territory old infarcts as seen on the prior CT. There is no acute intracranial hemorrhage. No mass effect or midline shift. No extra-axial fluid  collection. Vascular: No hyperdense vessel or unexpected calcification. Skull: Normal. Negative for fracture or focal lesion. Sinuses/Orbits: No acute finding. Other: None IMPRESSION: 1. No acute intracranial hemorrhage. 2. 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 Electronically Signed   By: Anner Crete M.D.   On: 12/17/2018 00:23   Dg Acute Abd 2+v Abd (supine,erect,decub) + 1v Chest  Result Date: 12/09/2018 CLINICAL DATA:  Nausea, vomiting and abdominal pain for 2 days. History of sickle cell disease. EXAM: DG ABDOMEN ACUTE W/ 1V CHEST COMPARISON:  Abdominal radiographs, 11/23/2018. Chest radiographs, 11/21/2018. FINDINGS: Normal bowel gas pattern.  No free air. No evidence of renal or ureteral stones. Stable vascular calcifications in the pelvis. Cardiac silhouette is mildly enlarged. No mediastinal or hilar masses. There are thickened interstitial markings bilaterally, less prominent than on the prior exam. Possible small effusions. No pneumothorax. No acute or significant skeletal abnormality. IMPRESSION: 1. No acute findings in the abdomen or pelvis. No bowel obstruction or free air. 2. Mild cardiomegaly with bilateral interstitial thickening, less prominent than on the prior study, but still consistent with mild interstitial pulmonary edema. No evidence of pneumonia. Electronically Signed   By: Lajean Manes M.D.   On: 12/09/2018 14:51   Ct Venogram Head  Result Date: 12/17/2018 CLINICAL DATA:  Altered mental status. Assess for dural venous sinus thrombosis. History of infarcts, sickle cell trait. EXAM: CT VENOGRAM HEAD TECHNIQUE: CT HEAD December 17, 2018.  CONTRAST:  40m OMNIPAQUE IOHEXOL 300 MG/ML  SOLN COMPARISON:  CT HEAD December 17, 2018. FINDINGS: Normal contrast opacification of the superior sagittal sinus, torcula of the Herophili, bilateral transverse, sigmoid sinuses and included internal jugular veins. No suspicious filling defects or focal stenoses. Normal appearance of the internal cerebral veins. IMPRESSION: No dural venous sinus thrombosis. Electronically Signed   By: CElon AlasM.D.   On: 12/17/2018 03:06    Microbiology: No results found for this or any previous visit (from the past 240 hour(s)).   Labs: Basic Metabolic Panel: Recent Labs  Lab 12/17/18 0521  12/18/18 0438 12/19/18 0507 12/20/18 0425 12/21/18 0416 12/22/18 0425  NA  --    < > 133* 135 134* 131* 131*  K  --    < > 3.8 3.4* 3.9 4.3 4.4  CL  --    < > 97* 101 99 96* 96*  CO2  --    < > 26 24 29 27 25   GLUCOSE  --    < > 180* 150* 251* 191* 206*  BUN  --    < > 9 10 10 13 15   CREATININE  --    < > 0.85 0.80 0.82 0.95 0.81  CALCIUM  --    < > 8.5* 8.6* 8.4* 8.5* 8.5*  MG 1.6*  --  2.5* 1.8  --   --   --    < > = values in this interval not displayed.   Liver Function Tests: Recent Labs  Lab 12/16/18 2022 12/17/18 0955 12/18/18 0438 12/19/18 0507  AST 32 38 86* 56*  ALT 25 24 59* 51*  ALKPHOS 48 50 56 56  BILITOT 1.7* 1.8* 1.0 1.4*  PROT 6.8 7.4 6.1* 6.4*  ALBUMIN 3.7 3.9 3.2* 3.4*   Recent Labs  Lab 12/16/18 2022  LIPASE 43   Recent Labs  Lab 12/16/18 2350  AMMONIA 13   CBC: Recent Labs  Lab 12/17/18 0955 12/18/18 0438 12/19/18 0507 12/20/18 0425 12/21/18 0416 12/22/18 0425  WBC 8.5  5.0 7.2 8.6 9.3 8.6  NEUTROABS 6.7  --   --   --   --   --   HGB 15.5* 12.7 13.0 12.7 11.6* 12.1  HCT 50.4* 40.7 40.9 40.0 36.5 38.1  MCV 69.2* 69.3* 68.6* 67.8* 67.7* 68.3*  PLT 203 181 182 184 187 216   Cardiac Enzymes: Recent Labs  Lab 12/17/18 0521 12/17/18 0955 12/17/18 1534  TROPONINI 1.15* 1.30* 1.27*   BNP: BNP (last 3  results) Recent Labs    10/19/18 2051 10/25/18 1053 12/16/18 2350  BNP 495.6* 267.6* 1,200.8*    ProBNP (last 3 results) No results for input(s): PROBNP in the last 8760 hours.  CBG: Recent Labs  Lab 12/21/18 1737 12/21/18 2144 12/22/18 0750 12/22/18 1159 12/22/18 1703  GLUCAP 217* 204* 150* 193* 207*       Signed:  Irine Seal MD.  [facility] 12/22/2018, 5:44 PM

## 2018-12-22 NOTE — Progress Notes (Signed)
Occupational Therapy Treatment Patient Details Name: Kristen Lozano MRN: [id number] DOB: Dec 19, 1955 Today's Date: 12/22/2018    History of present illness 62 y.o. female with medical history significant of hypertension, hyperlipidemia, diabetes mellitus, stroke, GERD, CHF with EF of 20%, colitis, sickle cell trait, left ventricle mural thrombus on Coumadin, GERD, depression, anxiety, chronic nausea, vomiting abdominal pain, who presents with nausea, vomiting and abdominal pain.   OT comments  Pt continues to make slow progress with adls and is at min guard to supervision level with most simple adls in the room. Until cognitive status improves, unsure if pt can get to mod I level. Will continue to follow.   Follow Up Recommendations  No OT follow up;Supervision/Assistance - 24 hour    Equipment Recommendations       Recommendations for Other Services      Precautions / Restrictions Precautions Precautions: Fall Restrictions Weight Bearing Restrictions: No       Mobility Bed Mobility Overal bed mobility: Modified Independent                Transfers Overall transfer level: Needs assistance Equipment used: Rolling walker (2 wheeled) Transfers: Sit to/from Stand Sit to Stand: Supervision         General transfer comment: supervision for safety    Balance Overall balance assessment: Needs assistance Sitting-balance support: Feet supported Sitting balance-Leahy Scale: Good     Standing balance support: Bilateral upper extremity supported;During functional activity Standing balance-Leahy Scale: Fair Standing balance comment: able to static stand with minguard for safety; use of bil UE support during mobility                           ADL either performed or assessed with clinical judgement   ADL Overall ADL's : Needs assistance/impaired Eating/Feeding: Supervision/ safety;Set up   Grooming: Wash/dry hands;Wash/dry face;Oral  care;Supervision/safety;Standing Grooming Details (indicate cue type and reason): Pt walked to sink to groom.             Lower Body Dressing: Min guard;Sit to/from stand;Cueing for compensatory techniques Lower Body Dressing Details (indicate cue type and reason): a few commands needed to be repeated with gestures but pt able to physically complete with no hands on assist. Toilet Transfer: Min guard;Ambulation;RW;Regular Glass blower/designer Details (indicate cue type and reason): Pt walked to bathroom with min guard assist.  Completed toileting without cues or physical assist.  Again...seems to do better with simple rote tasks. Toileting- Clothing Manipulation and Hygiene: Supervision/safety;Sit to/from stand       Functional mobility during ADLs: Min guard;Rolling walker General ADL Comments: Pt at min guard to S level with all basic adls in the room.     Vision       Perception     Praxis      Cognition Arousal/Alertness: Awake/alert Behavior During Therapy: WFL for tasks assessed/performed;Flat affect Overall Cognitive Status: History of cognitive impairments - at baseline                                 General Comments: Pt continues with slow processing, difficulty following multistep commands, confusion when asked to read clock but does well with simple every day tasks.        Exercises     Shoulder Instructions       General Comments Feel pt will remain at S level with adls due to cognitive  deficits.    Pertinent Vitals/ Pain       Pain Assessment: No/denies pain  Home Living                                          Prior Functioning/Environment              Frequency  Min 2X/week        Progress Toward Goals  OT Goals(current goals can now be found in the care plan section)  Progress towards OT goals: Progressing toward goals  Acute Rehab OT Goals Patient Stated Goal: none stated OT Goal Formulation:  With patient Time For Goal Achievement: 01/04/19 Potential to Achieve Goals: Good ADL Goals Pt Will Perform Grooming: with modified independence;standing Pt Will Perform Lower Body Dressing: with modified independence;sit to/from stand Pt Will Transfer to Toilet: with modified independence;ambulating Pt Will Perform Toileting - Clothing Manipulation and hygiene: with modified independence;sit to/from stand  Plan Discharge plan remains appropriate    Co-evaluation                 AM-PAC OT "6 Clicks" Daily Activity     Outcome Measure   Help from another person eating meals?: None Help from another person taking care of personal grooming?: None Help from another person toileting, which includes using toliet, bedpan, or urinal?: A Little Help from another person bathing (including washing, rinsing, drying)?: A Little Help from another person to put on and taking off regular upper body clothing?: None Help from another person to put on and taking off regular lower body clothing?: A Little 6 Click Score: 21    End of Session Equipment Utilized During Treatment: Gait belt;Rolling walker  OT Visit Diagnosis: Muscle weakness (generalized) (M62.81)   Activity Tolerance Patient tolerated treatment well   Patient Left in chair;with call bell/phone within reach;with chair alarm set   Nurse Communication Mobility status        Time: 8341-9622 OT Time Calculation (min): 18 min  Charges: OT General Charges $OT Visit: 1 Visit OT Treatments $Self Care/Home Management : 8-22 mins  Jinger Neighbors, OTR/L [phone]   Glenford Peers 12/22/2018, 12:12 PM

## 2018-12-22 NOTE — Telephone Encounter (Signed)
Prior authorization for Corlanor good to 12.31.2020.  Prescription at pharmacy, copay $3.90 per month.

## 2018-12-22 NOTE — Progress Notes (Signed)
Patient stated feeling nauseous. While spitting in emesis basin, no vomit noted. Zofran given per provider order. Will continue to monitor.

## 2018-12-22 NOTE — Telephone Encounter (Signed)
Received fax from [facility] that patient has been approved for Corlanor 5mg  tablet until 11/25/2019

## 2018-12-23 LAB — GASTROINTESTINAL PANEL BY PCR, STOOL (REPLACES STOOL CULTURE)

## 2018-12-24 LAB — OVA + PARASITE EXAM

## 2018-12-24 LAB — O&P RESULT

## 2018-12-25 ENCOUNTER — Other Ambulatory Visit: Payer: Self-pay | Admitting: Internal Medicine

## 2018-12-26 ENCOUNTER — Other Ambulatory Visit: Payer: Self-pay

## 2018-12-26 ENCOUNTER — Encounter (HOSPITAL_COMMUNITY): Payer: Self-pay | Admitting: *Deleted

## 2018-12-26 ENCOUNTER — Emergency Department (HOSPITAL_COMMUNITY)
Admission: EM | Admit: 2018-12-26 | Discharge: 2018-12-26 | Disposition: A | Discharge status: Home / Self Care | Payer: Medicare HMO | Attending: Emergency Medicine | Admitting: Emergency Medicine

## 2018-12-26 ENCOUNTER — Emergency Department (HOSPITAL_COMMUNITY): Payer: Medicare HMO

## 2018-12-26 DIAGNOSIS — I5041 Acute combined systolic (congestive) and diastolic (congestive) heart failure: Secondary | ICD-10-CM | POA: Diagnosis not present

## 2018-12-26 DIAGNOSIS — Z7982 Long term (current) use of aspirin: Secondary | ICD-10-CM | POA: Insufficient documentation

## 2018-12-26 DIAGNOSIS — Z79899 Other long term (current) drug therapy: Secondary | ICD-10-CM | POA: Insufficient documentation

## 2018-12-26 DIAGNOSIS — I110 Hypertensive heart disease with heart failure: Secondary | ICD-10-CM | POA: Diagnosis not present

## 2018-12-26 DIAGNOSIS — E1165 Type 2 diabetes mellitus with hyperglycemia: Secondary | ICD-10-CM | POA: Insufficient documentation

## 2018-12-26 DIAGNOSIS — E1110 Type 2 diabetes mellitus with ketoacidosis without coma: Secondary | ICD-10-CM | POA: Diagnosis not present

## 2018-12-26 DIAGNOSIS — Z794 Long term (current) use of insulin: Secondary | ICD-10-CM | POA: Diagnosis not present

## 2018-12-26 DIAGNOSIS — E1159 Type 2 diabetes mellitus with other circulatory complications: Secondary | ICD-10-CM | POA: Insufficient documentation

## 2018-12-26 DIAGNOSIS — R112 Nausea with vomiting, unspecified: Secondary | ICD-10-CM

## 2018-12-26 LAB — CBC
HCT: 39.7 % (ref 36.0–46.0)
Hemoglobin: 12.6 g/dL (ref 12.0–15.0)
MCH: 21.6 pg — ABNORMAL LOW (ref 26.0–34.0)
MCHC: 31.7 g/dL (ref 30.0–36.0)
MCV: 68 fL — AB (ref 80.0–100.0)
Platelets: 355 10*3/uL (ref 150–400)
RBC: 5.84 MIL/uL — ABNORMAL HIGH (ref 3.87–5.11)
RDW: 16.4 % — ABNORMAL HIGH (ref 11.5–15.5)
WBC: 9.6 10*3/uL (ref 4.0–10.5)
nRBC: 0 % (ref 0.0–0.2)

## 2018-12-26 LAB — COMPREHENSIVE METABOLIC PANEL
ALT: 42 U/L (ref 0–44)
AST: 35 U/L (ref 15–41)
Albumin: 3.5 g/dL (ref 3.5–5.0)
Alkaline Phosphatase: 66 U/L (ref 38–126)
Anion gap: 11 (ref 5–15)
BUN: 15 mg/dL (ref 8–23)
CO2: 24 mmol/L (ref 22–32)
Calcium: 9.4 mg/dL (ref 8.9–10.3)
Chloride: 103 mmol/L (ref 98–111)
Creatinine, Ser: 0.73 mg/dL (ref 0.44–1.00)
GFR calc Af Amer: >60 mL/min (ref >60)
GFR calc non Af Amer: >60 mL/min (ref >60)
Glucose, Bld: 146 mg/dL — ABNORMAL HIGH (ref 70–99)
Potassium: 4.1 mmol/L (ref 3.5–5.1)
SODIUM: 138 mmol/L (ref 135–145)
Total Bilirubin: 1.3 mg/dL — ABNORMAL HIGH (ref 0.3–1.2)
Total Protein: 7.2 g/dL (ref 6.5–8.1)

## 2018-12-26 LAB — URINALYSIS, ROUTINE W REFLEX MICROSCOPIC
Bilirubin Urine: NEGATIVE
Glucose, UA: NEGATIVE mg/dL
Hgb urine dipstick: NEGATIVE
Ketones, ur: 5 mg/dL — AB
Leukocytes, UA: NEGATIVE
Nitrite: NEGATIVE
Protein, ur: 100 mg/dL — AB
Specific Gravity, Urine: 1.01 (ref 1.005–1.030)
pH: 7 (ref 5.0–8.0)

## 2018-12-26 LAB — LIPASE, BLOOD: Lipase: 49 U/L (ref 11–51)

## 2018-12-26 LAB — PROTIME-INR
INR: 1.48
Prothrombin Time: 17.8 seconds — ABNORMAL HIGH (ref 11.4–15.2)

## 2018-12-26 MED ORDER — MORPHINE SULFATE (PF) 2 MG/ML IV SOLN
2.0000 mg | Freq: Once | INTRAVENOUS | Status: AC
  Administered 2018-12-26: 2 mg via INTRAVENOUS
  Filled 2018-12-26: qty 1

## 2018-12-26 MED ORDER — SODIUM CHLORIDE 0.9 % IV SOLN
INTRAVENOUS | Status: DC
  Administered 2018-12-26: 22:00:00 via INTRAVENOUS

## 2018-12-26 MED ORDER — METOPROLOL TARTRATE 5 MG/5ML IV SOLN
5.0000 mg | Freq: Once | INTRAVENOUS | Status: AC
  Administered 2018-12-26: 5 mg via INTRAVENOUS
  Filled 2018-12-26: qty 5

## 2018-12-26 MED ORDER — METOCLOPRAMIDE HCL 5 MG/ML IJ SOLN
5.0000 mg | Freq: Once | INTRAMUSCULAR | Status: AC
  Administered 2018-12-26: 5 mg via INTRAVENOUS
  Filled 2018-12-26: qty 2

## 2018-12-26 NOTE — ED Notes (Signed)
Patient transported to X-ray 

## 2018-12-26 NOTE — ED Notes (Signed)
Pt declined wheelchair for discharge.

## 2018-12-26 NOTE — ED Triage Notes (Signed)
Pt presents with intermittent abd pain that has been going for a few months.  Pt had blood work done and a stool sample was taken on Thursday.  Pt began to have abd pain yesterday and was able to keep her food down.  Pt has since had two episodes of emesis consisting of saliva. Pt endorses nausea but no diarrhea.

## 2018-12-26 NOTE — ED Provider Notes (Signed)
[facility] DEPT Provider Note   CSN: [id number] Arrival date & time: 12/26/18  2000     History   Chief Complaint Chief Complaint  Patient presents with  . Abdominal Pain    HPI SHAQUEL JOSEPHSON is a 63 y.o. female.  [age]-year-old female with history of multiple comorbidities including CHF and chronic abdominal pain presents with emesis x24 hours.  This is been a recurrent problem according to the old records.  She states that nothing makes it better or worse and her physicians have not been able to find an etiology.  Was just discharged from the hospital 3 days ago for similar symptoms.  Due to the emesis patient had resultant tachycardia and required IV Lopressor.  Today she states that she had nonbilious emesis which nothing makes better or worse.  No associated fever.  Denies any diarrhea or bloody stools.  States morphine and Reglan helped for the past.     Past Medical History:  Diagnosis Date  . Acute combined systolic and diastolic HF (heart failure) ([place]) 10/16/2018  . Allergic rhinitis    Requires cetirizine, singulair, and fluticasone.  . Anxiety    Has been on Xanax since 2009. Uses it for stress, anxiety, and insomnia. No contract yet.  . Arthritis   . CHF (congestive heart failure) (H[facility])    EF 35% after stroke, presumed ischemic  . Chronic pain    Has OA of knees B. No Xrays in echart. Not requiring narcotics.  . Colitis 12/2017  . Depression   . Diabetes mellitus    Type 2, non insulin dependent. Was dx'd prior to 2008.  Marland Kitchen Hyperglycemia   . Hyperlipemia   . Hypertension   . Sickle cell trait ([place])   . Stroke [facility]) 09/05/14   Dominant left MCA infarcts secondary to unknown embolic source     Patient Active Problem List   Diagnosis Date Noted  . Protein-calorie malnutrition, severe 12/18/2018  . Acute metabolic encephalopathy 78/67/5449  . General weakness 10/25/2018  . Acute combined systolic and diastolic HF (heart  failure) ([place]) 10/16/2018  . HLD (hyperlipidemia) 09/21/2018  . GERD (gastroesophageal reflux disease) 09/21/2018  . Nausea and vomiting 09/08/2018  . Nausea with vomiting 09/07/2018  . Dyspnea   . Hypomagnesemia   . Sinus tachycardia   . Malnutrition of moderate degree 08/13/2018  . Enteritis   . Pleural effusion   . Nausea 08/11/2018  . Dilated cardiomyopathy ([place])   . Nausea & vomiting 08/09/2018  . Type 2 diabetes mellitus with vascular disease ([place]) 08/09/2018  . Abdominal pain 08/09/2018  . Infectious colitis 01/11/2018  . Hypokalemia 01/11/2018  . Uncontrolled type 2 diabetes mellitus with hyperglycemia ([place])   . Chronic combined systolic and diastolic CHF (congestive heart failure) ([place]) 12/16/2016  . Monitoring for long-term anticoagulant use 11/14/2016  . DKA (diabetic ketoacidoses) ([place]) 11/01/2016  . Diabetes mellitus 11/01/2016  . Elevated troponin   . Nonischemic cardiomyopathy ([place])   . LV (left ventricular) mural thrombus following MI ([place])   . Cognitive deficit due to recent stroke 10/18/2014  . History of completed stroke 10/18/2014  . Abnormal stress test 10/17/2014  . Dysphagia, pharyngoesophageal phase 09/23/2014  . Abnormal CT scan 09/23/2014  . Dilated bile duct 09/23/2014  . Urinary retention 09/07/2014  . 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 Stroke ([place]) 09/05/2014  . Cerebral infarction ([place]) 09/05/2014  . Non compliance w medication regimen 08/10/2012  . Fatigue 02/18/2012  . Essential hypertension 03/22/2011  .  Healthcare maintenance 03/22/2011  . Chronic pain   . ALLERGIC RHINITIS, SEASONAL 03/14/2008  . SICKLE CELL TRAIT 10/31/2006  . Anxiety state 10/31/2006  . DEPRESSION 10/31/2006    Past Surgical History:  Procedure Laterality Date  . BIOPSY  09/23/2018   Procedure: BIOPSY;  Surgeon: Yetta Flock, MD;  Location: [facility];  Service: Gastroenterology;;  . ESOPHAGOGASTRODUODENOSCOPY (EGD) WITH PROPOFOL N/A  09/23/2018   Procedure: ESOPHAGOGASTRODUODENOSCOPY (EGD) WITH PROPOFOL;  Surgeon: Yetta Flock, MD;  Location: [facility];  Service: Gastroenterology;  Laterality: N/A;  . RIGHT/LEFT HEART CATH AND CORONARY ANGIOGRAPHY N/A 10/14/2018   Procedure: RIGHT/LEFT HEART CATH AND CORONARY ANGIOGRAPHY;  Surgeon: Troy Sine, MD;  Location: [facility] CV LAB;  Service: Cardiovascular;  Laterality: N/A;  . TEE WITHOUT CARDIOVERSION N/A 09/06/2014   Procedure: TRANSESOPHAGEAL ECHOCARDIOGRAM (TEE);  Surgeon: Pixie Casino, MD;  Location: [facility] ENDOSCOPY;  Service: Cardiovascular;  Laterality: N/A;     OB History   No obstetric history on file.      Home Medications    Prior to Admission medications   Medication Sig Start Date End Date Taking? Authorizing Provider  Acetaminophen 500 MG coapsule Take 500 mg by mouth daily as needed for mild pain or headache.     [provider]  ASPIRIN LOW DOSE 81 MG EC tablet Take 1 tablet (81 mg total) by mouth daily. Patient taking differently: Take 81 mg by mouth daily.  01/22/18   Hilty, Nadean Corwin, MD  atorvastatin (LIPITOR) 20 MG tablet Take 1 tablet (20 mg total) by mouth daily at 6 PM. Please schedule appointment for refills. 01/02/18   Hilty, Nadean Corwin, MD  bisacodyl (DULCOLAX) 10 MG suppository Place 1 suppository (10 mg total) rectally daily as needed for moderate constipation. 12/22/18   Eugenie Filler, MD  docusate sodium (COLACE) 100 MG capsule Take 1 capsule (100 mg total) by mouth 2 (two) times daily as needed for mild constipation. Patient not taking: Reported on 12/09/2018 10/16/18   Isaiah Serge, NP  furosemide (LASIX) 40 MG tablet Take 1 tablet (40 mg total) by mouth daily. 10/17/18   Isaiah Serge, NP  GLUCERNA (GLUCERNA) LIQD Take 237 mLs by mouth daily.    [provider]  guaiFENesin (ROBITUSSIN) 100 MG/5ML SOLN Take 15 mLs (300 mg total) by mouth 4 (four) times daily. Patient taking differently: Take 15 mLs  by mouth every 6 (six) hours as needed for cough.  10/16/18   Isaiah Serge, NP  ivabradine (CORLANOR) 5 MG TABS tablet Take 1 tablet (5 mg total) by mouth 2 (two) times daily with a meal. 12/22/18   Eugenie Filler, MD  LANTUS SOLOSTAR 100 UNIT/ML Solostar Pen Inject 10 Units into the skin at bedtime.  12/09/18   [provider]  losartan (COZAAR) 25 MG tablet Take 0.5 tablets (12.5 mg total) by mouth daily. 10/17/18   Isaiah Serge, NP  metFORMIN (GLUCOPHAGE-XR) 500 MG 24 hr tablet Take 2 tablets (1,000 mg total) by mouth 2 (two) times daily. 10/17/18   Isaiah Serge, NP  metoprolol succinate (TOPROL-XL) 100 MG 24 hr tablet Take 1 tablet (100 mg total) by mouth daily. Take with or immediately following a meal.  In the Morning Patient taking differently: Take 100 mg by mouth every morning. Take with or immediately following a meal.  In the Morning 10/17/18   Isaiah Serge, NP  metoprolol succinate (TOPROL-XL) 50 MG 24 hr tablet Take 1 tablet (50  mg total) by mouth at bedtime. Take with or immediately following a meal. 10/16/18   Isaiah Serge, NP  oxyCODONE (OXY IR/ROXICODONE) 5 MG immediate release tablet Take 1 tablet (5 mg total) by mouth every 6 (six) hours as needed for moderate pain. Patient not taking: Reported on 12/09/2018 11/24/18   Dhungel, Flonnie Overman, MD  pantoprazole (PROTONIX) 40 MG tablet Take 1 tablet (40 mg total) by mouth daily with supper. 10/16/18   Isaiah Serge, NP  polyethylene glycol (MIRALAX / GLYCOLAX) packet Take 17 g by mouth daily as needed. 12/22/18   Eugenie Filler, MD  potassium chloride (K-DUR) 10 MEQ tablet Take 1 tablet (10 mEq total) by mouth daily. 09/02/18   Georgette Shell, MD  prochlorperazine (COMPAZINE) 5 MG tablet Take 1 tablet (5 mg total) by mouth 3 (three) times daily before meals. Take 1 tablet 3 times daily before meals x1 week and then every 6 hours as needed. 12/22/18   Eugenie Filler, MD  sucralfate (CARAFATE) 1 GM/10ML  suspension Take 10 mLs (1 g total) by mouth 4 (four) times daily -  with meals and at bedtime for 10 days. 10/19/18 12/09/18  Long, Wonda Olds, MD  TRULICITY 0.24 OX/7.3ZH SOPN Inject 0.75 mg into the skin once a week. 12/21/18   [provider]  warfarin (COUMADIN) 5 MG tablet Take 1 tablet daily except 1.5 tablets on Mondays and Fridays, Or As Directed  by coumadin clinic 12/25/18   Pixie Casino, MD    Family History Family History  Problem Relation Age of Onset  . Diabetes Mother   . Hypertension Mother   . Heart disease Father   . Heart attack Father   . Hypertension Father   . Diabetes Father   . Ovarian cancer Sister   . Liver cancer Sister   . Sickle cell anemia Daughter   . Schizophrenia Daughter   . Hypertension Sister   . Hypertension Brother   . Hypertension Daughter   . Kidney disease Sister        x2  . Kidney disease Brother   . Stroke Neg Hx   . Esophageal cancer Neg Hx   . Colon cancer Neg Hx   . Colon polyps Neg Hx     Social History Social History   Tobacco Use  . Smoking status: Never Smoker  . Smokeless tobacco: Never Used  Substance Use Topics  . Alcohol use: No    Alcohol/week: 0.0 standard drinks  . Drug use: No     Allergies   Patient has no known allergies.   Review of Systems Review of Systems  All other systems reviewed and are negative.    Physical Exam Updated Vital Signs BP (!) 162/100 (BP Location: Left Arm)   Pulse (!) 141   Temp 97.8 F (36.6 C) (Oral)   Resp 18   Ht 1.6 m (5' 3" )   Wt 48.5 kg   SpO2 98%   BMI 18.95 kg/m   Physical Exam Vitals signs and nursing note reviewed.  Constitutional:      General: She is not in acute distress.    Appearance: Normal appearance. She is well-developed. She is not toxic-appearing.  HENT:     Head: Normocephalic and atraumatic.  Eyes:     General: Lids are normal.     Conjunctiva/sclera: Conjunctivae normal.     Pupils: Pupils are equal, round, and reactive to  light.  Neck:     Musculoskeletal: Normal range  of motion and neck supple.     Thyroid: No thyroid mass.     Trachea: No tracheal deviation.  Cardiovascular:     Rate and Rhythm: Regular rhythm. Tachycardia present.     Heart sounds: Normal heart sounds. No murmur. No gallop.   Pulmonary:     Effort: Pulmonary effort is normal. No respiratory distress.     Breath sounds: Normal breath sounds. No stridor. No decreased breath sounds, wheezing, rhonchi or rales.  Abdominal:     General: Bowel sounds are normal. There is no distension.     Palpations: Abdomen is soft. There is no fluid wave.     Tenderness: There is no abdominal tenderness. There is no rebound.  Musculoskeletal: Normal range of motion.        General: No tenderness.  Skin:    General: Skin is warm and dry.     Findings: No abrasion or rash.  Neurological:     Mental Status: She is alert and oriented to person, place, and time.     GCS: GCS eye subscore is 4. GCS verbal subscore is 5. GCS motor subscore is 6.     Cranial Nerves: No cranial nerve deficit.     Sensory: No sensory deficit.  Psychiatric:        Attention and Perception: She is inattentive.        Mood and Affect: Affect is blunt.        Speech: Speech is delayed.        Behavior: Behavior is cooperative.      ED Treatments / Results  Labs (all labs ordered are listed, but only abnormal results are displayed) Labs Reviewed  LIPASE, BLOOD  COMPREHENSIVE METABOLIC PANEL  CBC  URINALYSIS, ROUTINE W REFLEX MICROSCOPIC  PROTIME-INR    EKG EKG Interpretation  Date/Time:  Saturday December 26 2018 20:19:05 EST Ventricular Rate:  131 PR Interval:    QRS Duration: 85 QT Interval:  316 QTC Calculation: 467 R Axis:   -28 Text Interpretation:  Sinus tachycardia with irregular rate Probable left atrial enlargement Borderline left axis deviation Baseline wander in lead(s) V3 Confirmed by Lacretia Leigh ([id number]) on 12/26/2018 8:30:50 PM   Radiology No  results found.  Procedures Procedures (including critical care time)  Medications Ordered in ED Medications  0.9 %  sodium chloride infusion (has no administration in time range)  metoprolol tartrate (LOPRESSOR) injection 5 mg (has no administration in time range)  metoCLOPramide (REGLAN) injection 5 mg (has no administration in time range)  morphine 2 MG/ML injection 2 mg (has no administration in time range)     Initial Impression / Assessment and Plan / ED Course  I have reviewed the triage vital signs and the nursing notes.  Pertinent labs & imaging results that were available during my care of the patient were reviewed by me and considered in my medical decision making (see chart for details).     Patient medicated with IV fluids as well as given Lopressor here and heart rate has improved.  Treated with antiemetics as well 2.  Patient able to take orals without emesis.  Was instructed to follow-up with her doctor.  Final Clinical Impressions(s) / ED Diagnoses   Final diagnoses:  None    ED Discharge Orders    None       Lacretia Leigh, MD 12/26/18 2303

## 2018-12-30 ENCOUNTER — Emergency Department (HOSPITAL_COMMUNITY)
Admission: EM | Admit: 2018-12-30 | Discharge: 2018-12-31 | Disposition: A | Discharge status: Home / Self Care | Payer: Medicare HMO | Attending: Emergency Medicine | Admitting: Emergency Medicine

## 2018-12-30 ENCOUNTER — Other Ambulatory Visit: Payer: Self-pay

## 2018-12-30 DIAGNOSIS — Z7982 Long term (current) use of aspirin: Secondary | ICD-10-CM | POA: Diagnosis not present

## 2018-12-30 DIAGNOSIS — E119 Type 2 diabetes mellitus without complications: Secondary | ICD-10-CM | POA: Diagnosis not present

## 2018-12-30 DIAGNOSIS — I110 Hypertensive heart disease with heart failure: Secondary | ICD-10-CM | POA: Insufficient documentation

## 2018-12-30 DIAGNOSIS — Z7984 Long term (current) use of oral hypoglycemic drugs: Secondary | ICD-10-CM | POA: Insufficient documentation

## 2018-12-30 DIAGNOSIS — I5042 Chronic combined systolic (congestive) and diastolic (congestive) heart failure: Secondary | ICD-10-CM | POA: Insufficient documentation

## 2018-12-30 DIAGNOSIS — R1084 Generalized abdominal pain: Secondary | ICD-10-CM

## 2018-12-30 DIAGNOSIS — R109 Unspecified abdominal pain: Secondary | ICD-10-CM | POA: Diagnosis not present

## 2018-12-30 DIAGNOSIS — Z79899 Other long term (current) drug therapy: Secondary | ICD-10-CM | POA: Diagnosis not present

## 2018-12-30 DIAGNOSIS — Z7901 Long term (current) use of anticoagulants: Secondary | ICD-10-CM | POA: Diagnosis not present

## 2018-12-30 DIAGNOSIS — Z794 Long term (current) use of insulin: Secondary | ICD-10-CM | POA: Diagnosis not present

## 2018-12-30 DIAGNOSIS — R112 Nausea with vomiting, unspecified: Secondary | ICD-10-CM | POA: Diagnosis not present

## 2018-12-30 DIAGNOSIS — G8929 Other chronic pain: Secondary | ICD-10-CM | POA: Insufficient documentation

## 2018-12-30 LAB — COMPREHENSIVE METABOLIC PANEL
ALBUMIN: 3.4 g/dL — AB (ref 3.5–5.0)
ALT: 42 U/L (ref 0–44)
AST: 59 U/L — ABNORMAL HIGH (ref 15–41)
Alkaline Phosphatase: 55 U/L (ref 38–126)
Anion gap: 11 (ref 5–15)
BUN: 11 mg/dL (ref 8–23)
CO2: 23 mmol/L (ref 22–32)
Calcium: 9.2 mg/dL (ref 8.9–10.3)
Chloride: 101 mmol/L (ref 98–111)
Creatinine, Ser: 0.8 mg/dL (ref 0.44–1.00)
GFR calc Af Amer: >60 mL/min (ref >60)
GFR calc non Af Amer: >60 mL/min (ref >60)
Glucose, Bld: 326 mg/dL — ABNORMAL HIGH (ref 70–99)
Potassium: 3.9 mmol/L (ref 3.5–5.1)
Sodium: 135 mmol/L (ref 135–145)
Total Bilirubin: 1.2 mg/dL (ref 0.3–1.2)
Total Protein: 7.1 g/dL (ref 6.5–8.1)

## 2018-12-30 LAB — CBC
HCT: 38.7 % (ref 36.0–46.0)
HEMOGLOBIN: 12.4 g/dL (ref 12.0–15.0)
MCH: 21.8 pg — ABNORMAL LOW (ref 26.0–34.0)
MCHC: 32 g/dL (ref 30.0–36.0)
MCV: 68.1 fL — ABNORMAL LOW (ref 80.0–100.0)
Platelets: 353 10*3/uL (ref 150–400)
RBC: 5.68 MIL/uL — ABNORMAL HIGH (ref 3.87–5.11)
RDW: 17.2 % — ABNORMAL HIGH (ref 11.5–15.5)
WBC: 9.6 10*3/uL (ref 4.0–10.5)
nRBC: 0.2 % (ref 0.0–0.2)

## 2018-12-30 LAB — I-STAT TROPONIN, ED: Troponin i, poc: 0.04 ng/mL (ref 0.00–0.08)

## 2018-12-30 LAB — LIPASE, BLOOD: Lipase: 32 U/L (ref 11–51)

## 2018-12-30 MED ORDER — SODIUM CHLORIDE 0.9 % IV BOLUS
500.0000 mL | Freq: Once | INTRAVENOUS | Status: AC
  Administered 2018-12-30: 500 mL via INTRAVENOUS

## 2018-12-30 MED ORDER — KETOROLAC TROMETHAMINE 30 MG/ML IJ SOLN
15.0000 mg | Freq: Once | INTRAMUSCULAR | Status: AC
  Administered 2018-12-30: 15 mg via INTRAVENOUS
  Filled 2018-12-30: qty 1

## 2018-12-30 MED ORDER — INSULIN GLARGINE 100 UNIT/ML ~~LOC~~ SOLN
10.0000 [IU] | Freq: Once | SUBCUTANEOUS | Status: AC
  Administered 2018-12-30: 10 [IU] via SUBCUTANEOUS
  Filled 2018-12-30: qty 0.1

## 2018-12-30 MED ORDER — METOPROLOL SUCCINATE ER 50 MG PO TB24
50.0000 mg | ORAL_TABLET | Freq: Every day | ORAL | Status: DC
  Filled 2018-12-30: qty 1

## 2018-12-30 MED ORDER — SODIUM CHLORIDE 0.9% FLUSH
3.0000 mL | Freq: Once | INTRAVENOUS | Status: AC
  Administered 2018-12-30: 3 mL via INTRAVENOUS

## 2018-12-30 NOTE — ED Triage Notes (Signed)
Pt here with husband, who provides history. Pt has emesis and nausea. Pt was here for the same a few days ago. Pt was given reglan and morphine at that time. Pt's husband wants meds at home. Pts husband states no relief with phenergan, zofran, reglan.

## 2018-12-30 NOTE — ED Notes (Addendum)
Attempted to administer Metoprolol extended release and patient refused to take it, stating "I cannot take this medication unless it is crushed and in juice." Patient and family counseling provided on why this particular medication cannot be crushed. EDP made aware of refusal.

## 2018-12-30 NOTE — ED Provider Notes (Signed)
  Face-to-face evaluation   History: Patient presents for evaluation of nausea and vomiting.  Recent ED evaluation preceded by recent hospitalization.  Apparently she has ongoing difficulty with nausea, vomiting and abdominal pain.  She also recently had cardiac problems, with tachycardia and myocardial injury manifested by elevated troponin associated with the inability to eat, drink and take her medications.  Physical exam: Frail, elderly female who was initially sitting up and laid down for examination.  She does not respond with answers to questions.  Heart tachycardic without murmur, or rub.  Lungs clear anteriorly.  Abdomen hypoactive bowel sounds soft, nontender to palpation.  Medical screening examination/treatment/procedure(s) were conducted as a shared visit with non-physician practitioner(s) and myself.  I personally evaluated the patient during the encounter    Daleen Bo, MD 12/31/18 [id number]

## 2018-12-30 NOTE — ED Provider Notes (Signed)
[facility] DEPT Provider Note   CSN: [id number] Arrival date & time: 12/30/18  1834     History   Chief Complaint Chief Complaint  Patient presents with  . Abdominal Pain  . Emesis    HPI Kristen Lozano is a 63 y.o. female with a PMH of CHF, Type 2 Diabetes, CVA, and chronic abdominal pain presenting with intermittent diffuse abdominal pain and clear emesis onset last night. Husband reports multiple episodes of vomiting. Husband is the historian. Husband states patient has had similar symptoms for months and has been evaluated by GI without a clear etiology of her symptoms. Husband reports morphine and reglan have helped her symptoms in the past. Husband denies fever, shortness of breath, chest pain, chills, cough, congestion, dysuria, or diarrhea. Patient was previously evaluated in the ER four days ago. Last BM was yesterday and it was normal.   HPI  Past Medical History:  Diagnosis Date  . Acute combined systolic and diastolic HF (heart failure) ([place]) 10/16/2018  . Allergic rhinitis    Requires cetirizine, singulair, and fluticasone.  . Anxiety    Has been on Xanax since 2009. Uses it for stress, anxiety, and insomnia. No contract yet.  . Arthritis   . CHF (congestive heart failure) (HCC)    EF 35% after stroke, presumed ischemic  . Chronic pain    Has OA of knees B. No Xrays in echart. Not requiring narcotics.  . Colitis 12/2017  . Depression   . Diabetes mellitus    Type 2, non insulin dependent. Was dx'd prior to 2008.  Marland Kitchen Hyperglycemia   . Hyperlipemia   . Hypertension   . Sickle cell trait ([place])   . Stroke [place]) 09/05/14   Dominant left MCA infarcts secondary to unknown embolic source     Patient Active Problem List   Diagnosis Date Noted  . Protein-calorie malnutrition, severe 12/18/2018  . Acute metabolic encephalopathy 29/79/8921  . General weakness 10/25/2018  . Acute combined systolic and diastolic HF (heart failure) ([place])  10/16/2018  . HLD (hyperlipidemia) 09/21/2018  . GERD (gastroesophageal reflux disease) 09/21/2018  . Nausea and vomiting 09/08/2018  . Nausea with vomiting 09/07/2018  . Dyspnea   . Hypomagnesemia   . Sinus tachycardia   . Malnutrition of moderate degree 08/13/2018  . Enteritis   . Pleural effusion   . Nausea 08/11/2018  . Dilated cardiomyopathy ([place])   . Nausea & vomiting 08/09/2018  . Type 2 diabetes mellitus with vascular disease ([place]) 08/09/2018  . Abdominal pain 08/09/2018  . Infectious colitis 01/11/2018  . Hypokalemia 01/11/2018  . Uncontrolled type 2 diabetes mellitus with hyperglycemia ([place])   . Chronic combined systolic and diastolic CHF (congestive heart failure) ([place]) 12/16/2016  . Monitoring for long-term anticoagulant use 11/14/2016  . DKA (diabetic ketoacidoses) ([place]) 11/01/2016  . Diabetes mellitus 11/01/2016  . Elevated troponin   . Nonischemic cardiomyopathy ([place])   . LV (left ventricular) mural thrombus following MI ([place])   . Cognitive deficit due to recent stroke 10/18/2014  . History of completed stroke 10/18/2014  . Abnormal stress test 10/17/2014  . Dysphagia, pharyngoesophageal phase 09/23/2014  . Abnormal CT scan 09/23/2014  . Dilated bile duct 09/23/2014  . Urinary retention 09/07/2014  . 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 Stroke ([place]) 09/05/2014  . Cerebral infarction ([place]) 09/05/2014  . Non compliance w medication regimen 08/10/2012  . Fatigue 02/18/2012  . Essential hypertension 03/22/2011  . Healthcare maintenance 03/22/2011  . Chronic pain   .  ALLERGIC RHINITIS, SEASONAL 03/14/2008  . SICKLE CELL TRAIT 10/31/2006  . Anxiety state 10/31/2006  . DEPRESSION 10/31/2006    Past Surgical History:  Procedure Laterality Date  . BIOPSY  09/23/2018   Procedure: BIOPSY;  Surgeon: Yetta Flock, MD;  Location: [facility];  Service: Gastroenterology;;  . ESOPHAGOGASTRODUODENOSCOPY (EGD) WITH PROPOFOL N/A 09/23/2018   Procedure:  ESOPHAGOGASTRODUODENOSCOPY (EGD) WITH PROPOFOL;  Surgeon: Yetta Flock, MD;  Location: [facility];  Service: Gastroenterology;  Laterality: N/A;  . RIGHT/LEFT HEART CATH AND CORONARY ANGIOGRAPHY N/A 10/14/2018   Procedure: RIGHT/LEFT HEART CATH AND CORONARY ANGIOGRAPHY;  Surgeon: Troy Sine, MD;  Location: [facility];  Service: Cardiovascular;  Laterality: N/A;  . TEE WITHOUT CARDIOVERSION N/A 09/06/2014   Procedure: TRANSESOPHAGEAL ECHOCARDIOGRAM (TEE);  Surgeon: Pixie Casino, MD;  [facility] END[facility];  Service: Cardiovascular;  Laterality: N/A;     OB History   No obstetric history on file.      Home Medications    Prior to Admission medications   Medication Sig Start Date End Date Taking? Authorizing Provider  Acetaminophen 500 MG coapsule Take 500 mg by mouth daily as needed for mild pain or headache.     [provider]  ASPIRIN LOW DOSE 81 MG EC tablet Take 1 tablet (81 mg total) by mouth daily. Patient taking differently: Take 81 mg by mouth daily.  01/22/18   Hilty, Nadean Corwin, MD  atorvastatin (LIPITOR) 20 MG tablet Take 1 tablet (20 mg total) by mouth daily at 6 PM. Please schedule appointment for refills. 01/02/18   Hilty, Nadean Corwin, MD  bisacodyl (DULCOLAX) 10 MG suppository Place 1 suppository (10 mg total) rectally daily as needed for moderate constipation. 12/22/18   Eugenie Filler, MD  docusate sodium (COLACE) 100 MG capsule Take 1 capsule (100 mg total) by mouth 2 (two) times daily as needed for mild constipation. Patient not taking: Reported on 12/09/2018 10/16/18   Isaiah Serge, NP  furosemide (LASIX) 40 MG tablet Take 1 tablet (40 mg total) by mouth daily. 10/17/18   Isaiah Serge, NP  GLUCERNA (GLUCERNA) LIQD Take 237 mLs by mouth daily.    [provider]  guaiFENesin (ROBITUSSIN) 100 MG/5ML SOLN Take 15 mLs (300 mg total) by mouth 4 (four) times daily. Patient taking differently: Take 15 mLs by mouth every 6 (six)  hours as needed for cough.  10/16/18   Isaiah Serge, NP  ivabradine (CORLANOR) 5 MG TABS tablet Take 1 tablet (5 mg total) by mouth 2 (two) times daily with a meal. 12/22/18   Eugenie Filler, MD  LANTUS SOLOSTAR 100 UNIT/ML Solostar Pen Inject 10 Units into the skin at bedtime.  12/09/18   [provider]  losartan (COZAAR) 25 MG tablet Take 0.5 tablets (12.5 mg total) by mouth daily. 10/17/18   Isaiah Serge, NP  metFORMIN (GLUCOPHAGE-XR) 500 MG 24 hr tablet Take 2 tablets (1,000 mg total) by mouth 2 (two) times daily. 10/17/18   Isaiah Serge, NP  metoprolol succinate (TOPROL-XL) 100 MG 24 hr tablet Take 1 tablet (100 mg total) by mouth daily. Take with or immediately following a meal.  In the Morning Patient taking differently: Take 100 mg by mouth every morning. Take with or immediately following a meal.  In the Morning 10/17/18   Isaiah Serge, NP  metoprolol succinate (TOPROL-XL) 50 MG 24 hr tablet Take 1 tablet (50 mg total) by mouth at bedtime. Take with or immediately  following a meal. 10/16/18   Isaiah Serge, NP  oxyCODONE (OXY IR/ROXICODONE) 5 MG immediate release tablet Take 1 tablet (5 mg total) by mouth every 6 (six) hours as needed for moderate pain. Patient not taking: Reported on 12/09/2018 11/24/18   Dhungel, Flonnie Overman, MD  pantoprazole (PROTONIX) 40 MG tablet Take 1 tablet (40 mg total) by mouth daily with supper. 10/16/18   Isaiah Serge, NP  polyethylene glycol (MIRALAX / GLYCOLAX) packet Take 17 g by mouth daily as needed. 12/22/18   Eugenie Filler, MD  potassium chloride (K-DUR) 10 MEQ tablet Take 1 tablet (10 mEq total) by mouth daily. 09/02/18   Georgette Shell, MD  prochlorperazine (COMPAZINE) 5 MG tablet Take 1 tablet (5 mg total) by mouth 3 (three) times daily before meals. Take 1 tablet 3 times daily before meals x1 week and then every 6 hours as needed. 12/22/18   Eugenie Filler, MD  sucralfate (CARAFATE) 1 GM/10ML suspension Take 10 mLs (1  g total) by mouth 4 (four) times daily -  with meals and at bedtime for 10 days. 10/19/18 12/09/18  Long, Wonda Olds, MD  TRULICITY 5.62 ZH/0.8MV SOPN Inject 0.75 mg into the skin once a week. 12/21/18   [provider]  warfarin (COUMADIN) 5 MG tablet Take 1 tablet daily except 1.5 tablets on Mondays and Fridays, Or As Directed  by coumadin clinic 12/25/18   Pixie Casino, MD    Family History Family History  Problem Relation Age of Onset  . Diabetes Mother   . Hypertension Mother   . Heart disease Father   . Heart attack Father   . Hypertension Father   . Diabetes Father   . Ovarian cancer Sister   . Liver cancer Sister   . Sickle cell anemia Daughter   . Schizophrenia Daughter   . Hypertension Sister   . Hypertension Brother   . Hypertension Daughter   . Kidney disease Sister        x2  . Kidney disease Brother   . Stroke Neg Hx   . Esophageal cancer Neg Hx   . Colon cancer Neg Hx   . Colon polyps Neg Hx     Social History Social History   Tobacco Use  . Smoking status: Never Smoker  . Smokeless tobacco: Never Used  Substance Use Topics  . Alcohol use: No    Alcohol/week: 0.0 standard drinks  . Drug use: No     Allergies   Patient has no known allergies.   Review of Systems Review of Systems  Constitutional: Negative for activity change, appetite change, chills, fever and unexpected weight change.  HENT: Negative for congestion, rhinorrhea and sore throat.   Eyes: Negative for visual disturbance.  Respiratory: Negative for cough and shortness of breath.   Cardiovascular: Negative for chest pain.  Gastrointestinal: Positive for abdominal pain, nausea and vomiting. Negative for constipation and diarrhea.  Endocrine: Negative for polydipsia, polyphagia and polyuria.  Genitourinary: Negative for dysuria, flank pain and frequency.  Musculoskeletal: Negative for back pain.  Skin: Negative for rash.  Neurological: Negative for weakness.    Psychiatric/Behavioral: The patient is not nervous/anxious.     Physical Exam Updated Vital Signs BP (!) 144/98 (BP Location: Left Arm)   Pulse (!) 115   Temp 98.4 F (36.9 C) (Oral)   Resp 18   Ht 5\' 3"  (1.6 m)   Wt 48.5 kg   SpO2 100%   BMI 18.94 kg/m  Physical Exam Vitals signs and nursing note reviewed.  Constitutional:      General: She is not in acute distress.    Appearance: She is not diaphoretic.  HENT:     Head: Normocephalic and atraumatic.     Mouth/Throat:     Mouth: Mucous membranes are dry.     Pharynx: No pharyngeal swelling or oropharyngeal exudate.  Eyes:     General: No scleral icterus.    Extraocular Movements: Extraocular movements intact.     Pupils: Pupils are equal, round, and reactive to light.  Neck:     Musculoskeletal: Normal range of motion and neck supple.  Cardiovascular:     Rate and Rhythm: Regular rhythm. Tachycardia present.     Heart sounds: Normal heart sounds. No murmur. No friction rub. No gallop.   Pulmonary:     Effort: Pulmonary effort is normal. No respiratory distress.     Breath sounds: Normal breath sounds. No wheezing or rales.  Abdominal:     General: Bowel sounds are normal. There is no distension.     Palpations: Abdomen is soft. Abdomen is not rigid. There is no mass.     Tenderness: There is generalized abdominal tenderness. There is no right CVA tenderness, left CVA tenderness, guarding or rebound. Negative signs include Murphy's sign and McBurney's sign.     Hernia: No hernia is present.  Musculoskeletal: Normal range of motion.  Skin:    General: Skin is warm.     Findings: No rash.  Neurological:     Mental Status: She is alert.     Comments: Patient is only alert to person. Patient is not able to state where she is or today's date/year.     ED Treatments / Results  Labs (all labs ordered are listed, but only abnormal results are displayed) Labs Reviewed  COMPREHENSIVE METABOLIC PANEL - Abnormal;  Notable for the following components:      Result Value   Glucose, Bld 326 (*)    Albumin 3.4 (*)    AST 59 (*)    All other components within normal limits  CBC - Abnormal; Notable for the following components:   RBC 5.68 (*)    MCV 68.1 (*)    MCH 21.8 (*)    RDW 17.2 (*)    All other components within normal limits  LIPASE, BLOOD  URINALYSIS, ROUTINE W REFLEX MICROSCOPIC  I-STAT TROPONIN, ED    EKG EKG Interpretation  Date/Time:  Wednesday December 30 2018 21:11:45 EST Ventricular Rate:  118 PR Interval:    QRS Duration: 86 QT Interval:  344 QTC Calculation: 482 R Axis:   -37 Text Interpretation:  Sinus tachycardia Biatrial enlargement Left ventricular hypertrophy Anterior Q waves, possibly due to LVH Nonspecific T abnormalities, lateral leads since last tracing no significant change Confirmed by Daleen Bo [phone]) on 12/30/2018 10:59:14 PM   Radiology No results found.  Procedures Procedures (including critical care time)  Medications Ordered in ED Medications  insulin glargine (LANTUS) injection 10 Units (has no administration in time range)  metoprolol succinate (TOPROL-XL) 24 hr tablet 50 mg (has no administration in time range)  sodium chloride flush (NS) 0.9 % injection 3 mL (3 mLs Intravenous Given 12/30/18 2122)  sodium chloride 0.9 % bolus 500 mL (0 mLs Intravenous Stopped 12/30/18 2222)  ketorolac (TORADOL) 30 MG/ML injection 15 mg (15 mg Intravenous Given 12/30/18 2212)     Initial Impression / Assessment and Plan / ED Course  I have reviewed  the triage vital signs and the nursing notes.  Pertinent labs & imaging results that were available during my care of the patient were reviewed by me and considered in my medical decision making (see chart for details).  Clinical Course as of Dec 30 2310  Wed Dec 30, 2018  2235 Patient states pain has resolved. Husband requested home medications.    [AH]    Clinical Course User Index [AH] Arville Lime, PA-C     Patient is nontoxic, nonseptic appearing, in no apparent distress.  Patient's pain and other symptoms adequately managed in emergency department.  Fluid bolus given.  Labs, imaging and vitals reviewed.  Patient does not meet the SIRS or Sepsis criteria.  On repeat exam patient does not have a surgical abdomin and there are no peritoneal signs.  No indication of appendicitis, bowel obstruction, bowel perforation, cholecystitis, or diverticulitis at this time. Provided home medications while in the ER. Second troponin and repeat EKG pending.  Findings and plan of care discussed with supervising physician Dr. Eulis Foster who personally evaluated and examined this patient.  At shift change care was transferred to Quincy Carnes, PA-C who will follow pending studies, re-evaluate and determine disposition.    Final Clinical Impressions(s) / ED Diagnoses   Final diagnoses:  Generalized abdominal pain  Nausea and vomiting, intractability of vomiting not specified, unspecified vomiting type    ED Discharge Orders    None       Arville Lime, PA-C 12/30/18 2312    Daleen Bo, MD 12/31/18 2243

## 2018-12-30 NOTE — Discharge Instructions (Addendum)
You have been seen today for generalized abdominal pain, nausea, and vomiting. Please read and follow all provided instructions.   1. Medications: usual home medications 2. Treatment: rest, drink plenty of fluids 3. Follow Up: Please follow up with your primary doctor in 2 days for discussion of your diagnoses and further evaluation after today's visit; if you do not have a primary care doctor use the resource guide provided to find one; Please return to the ER for any new or worsening symptoms. Please obtain all of your results from medical records or have your doctors office obtain the results - share them with your doctor - you should be seen at your doctors office. Call today to arrange your follow up.   Take medications as prescribed. Please review all of the medicines and only take them if you do not have an allergy to them. Return to the emergency room for worsening condition or new concerning symptoms. Follow up with your regular doctor. If you don't have a regular doctor use one of the numbers below to establish a primary care doctor.  Please be aware that if you are taking birth control pills, taking other prescriptions, ESPECIALLY ANTIBIOTICS may make the birth control ineffective - if this is the case, either do not engage in sexual activity or use alternative methods of birth control such as condoms until you have finished the medicine and your family doctor says it is OK to restart them. If you are on a blood thinner such as COUMADIN, be aware that any other medicine that you take may cause the coumadin to either work too much, or not enough - you should have your coumadin level rechecked in next 7 days if this is the case.  ?  It is also a possibility that you have an allergic reaction to any of the medicines that you have been prescribed - Everybody reacts differently to medications and while MOST people have no trouble with most medicines, you may have a reaction such as nausea, vomiting,  rash, swelling, shortness of breath. If this is the case, please stop taking the medicine immediately and contact your physician.  ?  You should return to the ER if you develop severe or worsening symptoms.   Emergency Department Resource Guide 1) Find a Doctor and Pay Out of Pocket Although you won't have to find out who is covered by your insurance plan, it is a good idea to ask around and get recommendations. You will then need to call the office and see if the doctor you have chosen will accept you as a new patient and what types of options they offer for patients who are self-pay. Some doctors offer discounts or will set up payment plans for their patients who do not have insurance, but you will need to ask so you aren't surprised when you get to your appointment.  2) Contact Your Local Health Department Not all health departments have doctors that can see patients for sick visits, but many do, so it is worth a call to see if yours does. If you don't know where your local health department is, you can check in your phone book. The CDC also has a tool to help you locate your state's health department, and many state websites also have listings of all of their local health departments.  3) Find a [facility] If your illness is not likely to be very severe or complicated, you may want to try a walk in clinic. These are  popping up all over the country in pharmacies, drugstores, and shopping centers. They're usually staffed by nurse practitioners or physician assistants that have been trained to treat common illnesses and complaints. They're usually fairly quick and inexpensive. However, if you have serious medical issues or chronic medical problems, these are probably not your best option.  No Primary Care Doctor: Call Health Connect at  [phone] - they can help you locate a primary care doctor that  accepts your insurance, provides certain services, etc. Physician Referral [phone]  Emergency Department Resource Guide 1) Find a Doctor and Pay Out of Pocket Although you won't have to find out who is covered by your insurance plan, it is a good idea to ask around and get recommendations. You will then need to call the office and see if the doctor you have chosen will accept you as a new patient and what types of options they offer for patients who are self-pay. Some doctors offer discounts or will set up payment plans for their patients who do not have insurance, but you will need to ask so you aren't surprised when you get to your appointment.  2) Contact Your Local Health Department Not all health departments have doctors that can see patients for sick visits, but many do, so it is worth a call to see if yours does. If you don't know where your local health department is, you can check in your phone book. The CDC also has a tool to help you locate your state's health department, and many state websites also have listings of all of their local health departments.  3) Find a [facility] If your illness is not likely to be very severe or complicated, you may want to try a walk in clinic. These are popping up all over the country in pharmacies, drugstores, and shopping centers. They're usually staffed by nurse practitioners or physician assistants that have been trained to treat common illnesses and complaints. They're usually fairly quick and inexpensive. However, if you have serious medical issues or chronic medical problems, these are probably not your best option.  No Primary Care Doctor: Call Health Connect at  [phone] - they can help you locate a primary care doctor that  accepts your insurance, provides certain services, etc. Physician Referral Service- [phone]  Chronic Pain Problems: Organization         Address  Phone   Notes  [facility]  [phone] Patients need to be referred by their primary care doctor.    Medication Assistance: Organization         Address  Phone   Notes  [facility] Medication [facility] [address] [place], [place] [address] [phone] --Must be a resident of [facility] -- Must have NO insurance coverage whatsoever (no Medicaid/ Medicare, etc.) -- The pt. MUST have a primary care doctor that directs their care regularly and follows them in the community   [facility]  [phone]   [employer]  [phone]    Agencies that provide inexpensive medical care: Organization         Address  Phone   Notes  [place]  [phone]   [facility]    [phone]   [facility] [place], [place] [address] [phone]   [place] [address] [place] [phone]   [facility]    [phone])  [facility]    [phone]   [place] [address], [place] Phone:  [phone], Fax:  [phone] Hours of Operation:  9 am - 6 pm, M-F.  Also accepts Medicaid/Medicare and self-pay.  [facility] for [place] [address], [address], [place] Phone: [phone], Fax: [phone]. Hours of Operation:  8:30 am - 5:30 pm, M-F.  Also accepts Medicaid and self-pay.  [facility] [address] [place] Phone: [phone]   [place], [place], [place] [phone], Ext. [phone] Mondays & Thursdays: 7-9 AM.  First 15 patients are seen on a first come, first serve basis.    [place] Providers:  Organization         Address  Phone   Notes  [place] [address], [place] [phone] Also accepts self-pay patients.  [facility] [address], [place]  [phone]   [place], [address], [place] [phone]   [facility] [address], [place] [phone]   Lucianne Lei [address], [place]   [phone] Only accepts [place] patients after they have their name applied to their card.   Self-Pay (no insurance) in [facility]:  Organization         Address  Phone   Notes  Sickle Cell Patients, [facility] [place] [phone]   [facility] [place] [phone]   [facility] [place]  [place], [place], [place] [phone]   [facility]/Dr. Osei-Bonsu  [address] [place] or [address], [place] [phone] Phone number for both [place] and [place] locations is the same.  [facility]gent [facility] and [facility] [address] [place] [phone]   [facility] [address], [place] or [address] [phone]   [facility] [address] [place] [phone], phone; [phone], fax Sees patients 1st and 3rd Saturday of every month.  Must not qualify for public or private insurance (i.e. Medicaid, Medicare, [place] Health Choice, Veterans' Benefits)  Household income should be no more than 200% of the poverty level The clinic cannot treat you if you are pregnant or think you are pregnant  Sexually transmitted diseases are not treated at the clinic.

## 2018-12-31 ENCOUNTER — Emergency Department (HOSPITAL_COMMUNITY)
Admission: EM | Admit: 2018-12-31 | Discharge: 2018-12-31 | Disposition: A | Discharge status: Home / Self Care | Payer: Medicare HMO | Source: Home / Self Care | Attending: Emergency Medicine | Admitting: Emergency Medicine

## 2018-12-31 ENCOUNTER — Other Ambulatory Visit: Payer: Self-pay

## 2018-12-31 ENCOUNTER — Encounter (HOSPITAL_COMMUNITY): Payer: Self-pay | Admitting: Emergency Medicine

## 2018-12-31 DIAGNOSIS — I5042 Chronic combined systolic (congestive) and diastolic (congestive) heart failure: Secondary | ICD-10-CM | POA: Insufficient documentation

## 2018-12-31 DIAGNOSIS — G8929 Other chronic pain: Secondary | ICD-10-CM

## 2018-12-31 DIAGNOSIS — Z7982 Long term (current) use of aspirin: Secondary | ICD-10-CM

## 2018-12-31 DIAGNOSIS — R112 Nausea with vomiting, unspecified: Secondary | ICD-10-CM | POA: Diagnosis not present

## 2018-12-31 DIAGNOSIS — E119 Type 2 diabetes mellitus without complications: Secondary | ICD-10-CM | POA: Insufficient documentation

## 2018-12-31 DIAGNOSIS — Z7984 Long term (current) use of oral hypoglycemic drugs: Secondary | ICD-10-CM

## 2018-12-31 DIAGNOSIS — R109 Unspecified abdominal pain: Secondary | ICD-10-CM | POA: Insufficient documentation

## 2018-12-31 DIAGNOSIS — I110 Hypertensive heart disease with heart failure: Secondary | ICD-10-CM

## 2018-12-31 LAB — CBC WITH DIFFERENTIAL/PLATELET
Abs Immature Granulocytes: 0.05 10*3/uL (ref 0.00–0.07)
Basophils Absolute: 0.1 10*3/uL (ref 0.0–0.1)
Basophils Relative: 1 %
Eosinophils Absolute: 0 10*3/uL (ref 0.0–0.5)
Eosinophils Relative: 0 %
HEMATOCRIT: 39.3 % (ref 36.0–46.0)
Hemoglobin: 12.6 g/dL (ref 12.0–15.0)
IMMATURE GRANULOCYTES: 1 %
Lymphocytes Relative: 17 %
Lymphs Abs: 1.7 10*3/uL (ref 0.7–4.0)
MCH: 21.2 pg — ABNORMAL LOW (ref 26.0–34.0)
MCHC: 32.1 g/dL (ref 30.0–36.0)
MCV: 66.2 fL — AB (ref 80.0–100.0)
Monocytes Absolute: 1.3 10*3/uL — ABNORMAL HIGH (ref 0.1–1.0)
Monocytes Relative: 13 %
Neutro Abs: 6.6 10*3/uL (ref 1.7–7.7)
Neutrophils Relative %: 68 %
Platelets: 304 10*3/uL (ref 150–400)
RBC: 5.94 MIL/uL — ABNORMAL HIGH (ref 3.87–5.11)
RDW: 16.1 % — ABNORMAL HIGH (ref 11.5–15.5)
WBC: 9.8 10*3/uL (ref 4.0–10.5)
nRBC: 0.2 % (ref 0.0–0.2)

## 2018-12-31 LAB — URINALYSIS, ROUTINE W REFLEX MICROSCOPIC
Bilirubin Urine: NEGATIVE
Glucose, UA: >=500 mg/dL — AB
Hgb urine dipstick: NEGATIVE
Ketones, ur: NEGATIVE mg/dL
Leukocytes, UA: NEGATIVE
Nitrite: NEGATIVE
Protein, ur: >=300 mg/dL — AB
Specific Gravity, Urine: 1.011 (ref 1.005–1.030)
pH: 6 (ref 5.0–8.0)

## 2018-12-31 LAB — COMPREHENSIVE METABOLIC PANEL
ALT: 112 U/L — ABNORMAL HIGH (ref 0–44)
ANION GAP: 12 (ref 5–15)
AST: 130 U/L — ABNORMAL HIGH (ref 15–41)
Albumin: 3.2 g/dL — ABNORMAL LOW (ref 3.5–5.0)
Alkaline Phosphatase: 52 U/L (ref 38–126)
BUN: 12 mg/dL (ref 8–23)
CO2: 23 mmol/L (ref 22–32)
Calcium: 8.9 mg/dL (ref 8.9–10.3)
Chloride: 101 mmol/L (ref 98–111)
Creatinine, Ser: 0.9 mg/dL (ref 0.44–1.00)
GFR calc Af Amer: >60 mL/min (ref >60)
GFR calc non Af Amer: >60 mL/min (ref >60)
Glucose, Bld: 214 mg/dL — ABNORMAL HIGH (ref 70–99)
Potassium: 4 mmol/L (ref 3.5–5.1)
Sodium: 136 mmol/L (ref 135–145)
Total Bilirubin: 1.7 mg/dL — ABNORMAL HIGH (ref 0.3–1.2)
Total Protein: 6.5 g/dL (ref 6.5–8.1)

## 2018-12-31 LAB — I-STAT TROPONIN, ED: Troponin i, poc: 0.04 ng/mL (ref 0.00–0.08)

## 2018-12-31 LAB — LIPASE, BLOOD: LIPASE: 20 U/L (ref 11–51)

## 2018-12-31 MED ORDER — MORPHINE SULFATE (PF) 4 MG/ML IV SOLN
4.0000 mg | Freq: Once | INTRAVENOUS | Status: AC
  Administered 2018-12-31: 4 mg via INTRAVENOUS
  Filled 2018-12-31: qty 1

## 2018-12-31 MED ORDER — LACTATED RINGERS IV BOLUS
1000.0000 mL | Freq: Once | INTRAVENOUS | Status: AC
  Administered 2018-12-31: 1000 mL via INTRAVENOUS

## 2018-12-31 MED ORDER — METOCLOPRAMIDE HCL 5 MG/ML IJ SOLN
10.0000 mg | Freq: Once | INTRAMUSCULAR | Status: AC
  Administered 2018-12-31: 10 mg via INTRAVENOUS
  Filled 2018-12-31: qty 2

## 2018-12-31 NOTE — ED Triage Notes (Signed)
Pt here for stomach pain per husband. Pt has had pain for months and seeing gastroenterologist with no relief. States he was at [place] with no relief last night. Pain 10/10

## 2018-12-31 NOTE — ED Notes (Signed)
Patient verbalizes understanding of discharge instructions. Opportunity for questioning and answers were provided. Armband removed by staff, pt discharged from ED.  

## 2018-12-31 NOTE — ED Notes (Signed)
Patient has a history of HTN and will take evening BP medication at home.

## 2018-12-31 NOTE — ED Provider Notes (Signed)
Assumed care from [facility] and Dr. Eulis Foster at shift change.  See prior notes for full H&P.  Briefly, 63 year old female here with nausea and vomiting.  Has been having intermittent issues with this for several months.  Recently admitted with elevated troponin and tachycardia, felt to be related to her poor oral intake and vomiting.  Labs thus far reassuring.  Symptoms controlled here.  Plan:  Delta trop and UA pending.  If negative, can discharge home.  Results for orders placed or performed during the hospital encounter of 12/30/18  Lipase, blood  Result Value Ref Range   Lipase 32 11 - 51 U/L  Comprehensive metabolic panel  Result Value Ref Range   Sodium 135 135 - 145 mmol/L   Potassium 3.9 3.5 - 5.1 mmol/L   Chloride 101 98 - 111 mmol/L   CO2 23 22 - 32 mmol/L   Glucose, Bld 326 (H) 70 - 99 mg/dL   BUN 11 8 - 23 mg/dL   Creatinine, Ser 0.80 0.44 - 1.00 mg/dL   Calcium 9.2 8.9 - 10.3 mg/dL   Total Protein 7.1 6.5 - 8.1 g/dL   Albumin 3.4 (L) 3.5 - 5.0 g/dL   AST 59 (H) 15 - 41 U/L   ALT 42 0 - 44 U/L   Alkaline Phosphatase 55 38 - 126 U/L   Total Bilirubin 1.2 0.3 - 1.2 mg/dL   GFR calc non Af Amer >60 >60 mL/min   GFR calc Af Amer >60 >60 mL/min   Anion gap 11 5 - 15  CBC  Result Value Ref Range   WBC 9.6 4.0 - 10.5 K/uL   RBC 5.68 (H) 3.87 - 5.11 MIL/uL   Hemoglobin 12.4 12.0 - 15.0 g/dL   HCT 38.7 36.0 - 46.0 %   MCV 68.1 (L) 80.0 - 100.0 fL   MCH 21.8 (L) 26.0 - 34.0 pg   MCHC 32.0 30.0 - 36.0 g/dL   RDW 17.2 (H) 11.5 - 15.5 %   Platelets 353 150 - 400 K/uL   nRBC 0.2 0.0 - 0.2 %  Urinalysis, Routine w reflex microscopic  Result Value Ref Range   Color, Urine YELLOW YELLOW   APPearance CLEAR CLEAR   Specific Gravity, Urine 1.011 1.005 - 1.030   pH 6.0 5.0 - 8.0   Glucose, UA >=500 (A) NEGATIVE mg/dL   Hgb urine dipstick NEGATIVE NEGATIVE   Bilirubin Urine NEGATIVE NEGATIVE   Ketones, ur NEGATIVE NEGATIVE mg/dL   Protein, ur >=300 (A) NEGATIVE mg/dL   Nitrite  NEGATIVE NEGATIVE   Leukocytes, UA NEGATIVE NEGATIVE   RBC / HPF 0-5 0 - 5 RBC/hpf   WBC, UA 0-5 0 - 5 WBC/hpf   Bacteria, UA RARE (A) NONE SEEN   Squamous Epithelial / LPF 0-5 0 - 5   Mucus PRESENT    Hyaline Casts, UA PRESENT   I-stat troponin, ED  Result Value Ref Range   Troponin i, poc 0.04 0.00 - 0.08 ng/mL   Comment 3          I-stat troponin, ED  Result Value Ref Range   Troponin i, poc 0.04 0.00 - 0.08 ng/mL   Comment 3           Ct Head Wo Contrast  Result Date: 12/17/2018 CLINICAL DATA:  63 year old female with altered mental status. EXAM: CT HEAD WITHOUT CONTRAST TECHNIQUE: Contiguous axial images were obtained from the base of the skull through the vertex without intravenous contrast. COMPARISON:  Head CT dated  10/25/2018 and MRI dated 10/26/2018 FINDINGS: Brain: The ventricles and sulci appropriate size for patient's age. There is mild chronic microvascular ischemic changes. Bilateral MCA territory old infarcts as seen on the prior CT. There is no acute intracranial hemorrhage. No mass effect or midline shift. No extra-axial fluid collection. Vascular: No hyperdense vessel or unexpected calcification. Skull: Normal. Negative for fracture or focal lesion. Sinuses/Orbits: No acute finding. Other: None IMPRESSION: 1. No acute intracranial hemorrhage. 2. 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 Electronically Signed   By: Anner Crete M.D.   On: 12/17/2018 00:23   Dg Abd Acute 2+v W 1v Chest  Result Date: 12/26/2018 CLINICAL DATA:  Intermittent abdominal pain for a few months. Vomiting yesterday. EXAM: DG ABDOMEN ACUTE W/ 1V CHEST COMPARISON:  12/09/2018 FINDINGS: Cardiac enlargement. No vascular congestion or edema. There is infiltration or atelectasis in both lung bases, slightly greater on the left. Similar to prior study. No blunting of costophrenic angles. No pneumothorax. Mediastinal contours appear intact. Scattered gas and stool  throughout the colon. No small or large bowel distention. No free intra-abdominal air. No abnormal air-fluid levels. No radiopaque stones. Visualized bones and soft tissues appear intact. IMPRESSION: Cardiac enlargement. Infiltration or atelectasis in the lung bases, slightly greater on the left. Nonobstructive bowel gas pattern. Electronically Signed   By: Lucienne Capers M.D.   On: 12/26/2018 21:35   Dg Acute Abd 2+v Abd (supine,erect,decub) + 1v Chest  Result Date: 12/09/2018 CLINICAL DATA:  Nausea, vomiting and abdominal pain for 2 days. History of sickle cell disease. EXAM: DG ABDOMEN ACUTE W/ 1V CHEST COMPARISON:  Abdominal radiographs, 11/23/2018. Chest radiographs, 11/21/2018. FINDINGS: Normal bowel gas pattern.  No free air. No evidence of renal or ureteral stones. Stable vascular calcifications in the pelvis. Cardiac silhouette is mildly enlarged. No mediastinal or hilar masses. There are thickened interstitial markings bilaterally, less prominent than on the prior exam. Possible small effusions. No pneumothorax. No acute or significant skeletal abnormality. IMPRESSION: 1. No acute findings in the abdomen or pelvis. No bowel obstruction or free air. 2. Mild cardiomegaly with bilateral interstitial thickening, less prominent than on the prior study, but still consistent with mild interstitial pulmonary edema. No evidence of pneumonia. Electronically Signed   By: Lajean Manes M.D.   On: 12/09/2018 14:51   Ct Venogram Head  Result Date: 12/17/2018 CLINICAL DATA:  Altered mental status. Assess for dural venous sinus thrombosis. History of infarcts, sickle cell trait. EXAM: CT VENOGRAM HEAD TECHNIQUE: CT HEAD December 17, 2018. CONTRAST:  50mL OMNIPAQUE IOHEXOL 300 MG/ML  SOLN COMPARISON:  CT HEAD December 17, 2018. FINDINGS: Normal contrast opacification of the superior sagittal sinus, torcula of the Herophili, bilateral transverse, sigmoid sinuses and included internal jugular veins. No suspicious  filling defects or focal stenoses. Normal appearance of the internal cerebral veins. IMPRESSION: No dural venous sinus thrombosis. Electronically Signed   By: Elon Alas M.D.   On: 12/17/2018 03:06    1:21 AM Delta trop unchanged.  UA without signs of infection.  Patient without any current pain.  VSS. Feel she is stable for discharge.  Can follow-up with PCP and/or GI specialist.  Return here for any new/acute changes.   Larene Pickett, PA-C 12/31/18 0125    Molpus, Jenny Reichmann, MD 12/31/18 [phone]

## 2018-12-31 NOTE — ED Provider Notes (Signed)
[facility] EMERGENCY DEPARTMENT Provider Note   CSN: [id number] Arrival date & time: 12/31/18  1703     History   Chief Complaint No chief complaint on file.   HPI CIARAH PEACE is a 63 y.o. female.  The history is provided by the patient, the spouse and medical records. No language interpreter was used.  Abdominal Pain     64 year old female with history of diabetes, CHF, chronic pain, colitis, hypertension who has severe protein calorie malnutrition presented for evaluation of abdominal pain.  Patient is a poor historian, history obtained through patient and through husband who is at bedside.  Patient has had recurrent abdominal pain for many months.  She also has persistent nausea and vomiting and facial constipation or diarrhea.  The symptoms are not new and she has been evaluated extensively for this in the past.  Her last ER visit was yesterday for the same complaint.  She did receive some treatment and symptoms got better but when she came home her symptoms resumed.  No report of any fever or chills, no bloody vomit, or any dysuria.  Husband mentioned that she even have stool sample to test for parasite without any definitive diagnosis.  Difficult to assess the severity of her symptoms as patient is a poor historian.  Past Medical History:  Diagnosis Date  . Acute combined systolic and diastolic HF (heart failure) ([place]) 10/16/2018  . Allergic rhinitis    Requires cetirizine, singulair, and fluticasone.  . Anxiety    Has been on Xanax since 2009. Uses it for stress, anxiety, and insomnia. No contract yet.  . Arthritis   . CHF (congestive heart failure) (H[facility])    EF 35% after stroke, presumed ischemic  . Chronic pain    Has OA of knees B. No Xrays in echart. Not requiring narcotics.  . Colitis 12/2017  . Depression   . Diabetes mellitus    Type 2, non insulin dependent. Was dx'd prior to 2008.  Marland Kitchen Hyperglycemia   . Hyperlipemia   . Hypertension   .  Sickle cell trait ([place])   . Stroke [facility]) 09/05/14   Dominant left MCA infarcts secondary to unknown embolic source     Patient Active Problem List   Diagnosis Date Noted  . Protein-calorie malnutrition, severe 12/18/2018  . Acute metabolic encephalopathy 40/98/1191  . General weakness 10/25/2018  . Acute combined systolic and diastolic HF (heart failure) ([place]) 10/16/2018  . HLD (hyperlipidemia) 09/21/2018  . GERD (gastroesophageal reflux disease) 09/21/2018  . Nausea and vomiting 09/08/2018  . Nausea with vomiting 09/07/2018  . Dyspnea   . Hypomagnesemia   . Sinus tachycardia   . Malnutrition of moderate degree 08/13/2018  . Enteritis   . Pleural effusion   . Nausea 08/11/2018  . Dilated cardiomyopathy ([place])   . Nausea & vomiting 08/09/2018  . Type 2 diabetes mellitus with vascular disease ([place]) 08/09/2018  . Abdominal pain 08/09/2018  . Infectious colitis 01/11/2018  . Hypokalemia 01/11/2018  . Uncontrolled type 2 diabetes mellitus with hyperglycemia ([place])   . Chronic combined systolic and diastolic CHF (congestive heart failure) ([place]) 12/16/2016  . Monitoring for long-term anticoagulant use 11/14/2016  . DKA (diabetic ketoacidoses) ([place]) 11/01/2016  . Diabetes mellitus 11/01/2016  . Elevated troponin   . Nonischemic cardiomyopathy ([place])   . LV (left ventricular) mural thrombus following MI ([place])   . Cognitive deficit due to recent stroke 10/18/2014  . History of completed stroke 10/18/2014  . Abnormal stress test  10/17/2014  . Dysphagia, pharyngoesophageal phase 09/23/2014  . Abnormal CT scan 09/23/2014  . Dilated bile duct 09/23/2014  . Urinary retention 09/07/2014  . 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 Stroke ([place]) 09/05/2014  . Cerebral infarction ([place]) 09/05/2014  . Non compliance w medication regimen 08/10/2012  . Fatigue 02/18/2012  . Essential hypertension 03/22/2011  . Healthcare maintenance 03/22/2011  . Chronic pain   . ALLERGIC RHINITIS,  SEASONAL 03/14/2008  . SICKLE CELL TRAIT 10/31/2006  . Anxiety state 10/31/2006  . DEPRESSION 10/31/2006    Past Surgical History:  Procedure Laterality Date  . BIOPSY  09/23/2018   Procedure: BIOPSY;  Surgeon: Yetta Flock, MD;  Location: [facility];  Service: Gastroenterology;;  . ESOPHAGOGASTRODUODENOSCOPY (EGD) WITH PROPOFOL N/A 09/23/2018   Procedure: ESOPHAGOGASTRODUODENOSCOPY (EGD) WITH PROPOFOL;  Surgeon: Yetta Flock, MD;  Location: [facility] ENDOS[facility];  Service: Gastroenterology;  Laterality: N/A;  . RIGHT/LEFT HEART CATH AND CORONARY ANGIOGRAPHY N/A 10/14/2018   Procedure: RIGHT/LEFT HEART CATH AND CORONARY ANGIOGRAPHY;  Surgeon: Troy Sine, MD;  Location: [facility];  Service: Cardiovascular;  Laterality: N/A;  . TEE WITHOUT CARDIOVERSION N/A 09/06/2014   Procedure: TRANSESOPHAGEAL ECHOCARDIOGRAM (TEE);  Surgeon: Pixie Casino, MD;  Location: [facility] ENDOSCOPY;  Service: Cardiovascular;  Laterality: N/A;     OB History   No obstetric history on file.      Home Medications    Prior to Admission medications   Medication Sig Start Date End Date Taking? Authorizing Provider  Acetaminophen 500 MG coapsule Take 500 mg by mouth daily as needed for mild pain or headache.     [provider]  ASPIRIN LOW DOSE 81 MG EC tablet Take 1 tablet (81 mg total) by mouth daily. Patient taking differently: Take 81 mg by mouth daily.  01/22/18   Hilty, Nadean Corwin, MD  atorvastatin (LIPITOR) 20 MG tablet Take 1 tablet (20 mg total) by mouth daily at 6 PM. Please schedule appointment for refills. 01/02/18   Hilty, Nadean Corwin, MD  bisacodyl (DULCOLAX) 10 MG suppository Place 1 suppository (10 mg total) rectally daily as needed for moderate constipation. 12/22/18   Eugenie Filler, MD  docusate sodium (COLACE) 100 MG capsule Take 1 capsule (100 mg total) by mouth 2 (two) times daily as needed for mild constipation. Patient not taking: Reported on 12/09/2018 10/16/18    Isaiah Serge, NP  furosemide (LASIX) 40 MG tablet Take 1 tablet (40 mg total) by mouth daily. 10/17/18   Isaiah Serge, NP  GLUCERNA (GLUCERNA) LIQD Take 237 mLs by mouth daily.    [provider]  guaiFENesin (ROBITUSSIN) 100 MG/5ML SOLN Take 15 mLs (300 mg total) by mouth 4 (four) times daily. Patient taking differently: Take 15 mLs by mouth every 6 (six) hours as needed for cough.  10/16/18   Isaiah Serge, NP  ivabradine (CORLANOR) 5 MG TABS tablet Take 1 tablet (5 mg total) by mouth 2 (two) times daily with a meal. 12/22/18   Eugenie Filler, MD  LANTUS SOLOSTAR 100 UNIT/ML Solostar Pen Inject 10 Units into the skin at bedtime.  12/09/18   [provider]  losartan (COZAAR) 25 MG tablet Take 0.5 tablets (12.5 mg total) by mouth daily. 10/17/18   Isaiah Serge, NP  metFORMIN (GLUCOPHAGE-XR) 500 MG 24 hr tablet Take 2 tablets (1,000 mg total) by mouth 2 (two) times daily. 10/17/18   Isaiah Serge, NP  metoprolol succinate (TOPROL-XL) 100 MG 24 hr tablet Take  1 tablet (100 mg total) by mouth daily. Take with or immediately following a meal.  In the Morning Patient taking differently: Take 100 mg by mouth every morning. Take with or immediately following a meal.  In the Morning 10/17/18   Isaiah Serge, NP  metoprolol succinate (TOPROL-XL) 50 MG 24 hr tablet Take 1 tablet (50 mg total) by mouth at bedtime. Take with or immediately following a meal. 10/16/18   Isaiah Serge, NP  oxyCODONE (OXY IR/ROXICODONE) 5 MG immediate release tablet Take 1 tablet (5 mg total) by mouth every 6 (six) hours as needed for moderate pain. Patient not taking: Reported on 12/09/2018 11/24/18   Dhungel, Flonnie Overman, MD  pantoprazole (PROTONIX) 40 MG tablet Take 1 tablet (40 mg total) by mouth daily with supper. 10/16/18   Isaiah Serge, NP  polyethylene glycol (MIRALAX / GLYCOLAX) packet Take 17 g by mouth daily as needed. Patient taking differently: Take 17 g by mouth daily as needed for  mild constipation.  12/22/18   Eugenie Filler, MD  potassium chloride (K-DUR) 10 MEQ tablet Take 1 tablet (10 mEq total) by mouth daily. 09/02/18   Georgette Shell, MD  prochlorperazine (COMPAZINE) 5 MG tablet Take 1 tablet (5 mg total) by mouth 3 (three) times daily before meals. Take 1 tablet 3 times daily before meals x1 week and then every 6 hours as needed. Patient taking differently: Take 5 mg by mouth 3 (three) times daily before meals. Take 1 tablet 3 times daily before meals x1 week and then every 6 hours as needed for nausea 12/22/18   Eugenie Filler, MD  sucralfate (CARAFATE) 1 GM/10ML suspension Take 10 mLs (1 g total) by mouth 4 (four) times daily -  with meals and at bedtime for 10 days. 10/19/18 12/30/18  Long, Wonda Olds, MD  TRULICITY 1.44 RX/5.4MG SOPN Inject 0.75 mg into the skin once a week. 12/21/18   [provider]  warfarin (COUMADIN) 5 MG tablet Take 1 tablet daily except 1.5 tablets on Mondays and Fridays, Or As Directed  by coumadin clinic 12/25/18   Pixie Casino, MD    Family History Family History  Problem Relation Age of Onset  . Diabetes Mother   . Hypertension Mother   . Heart disease Father   . Heart attack Father   . Hypertension Father   . Diabetes Father   . Ovarian cancer Sister   . Liver cancer Sister   . Sickle cell anemia Daughter   . Schizophrenia Daughter   . Hypertension Sister   . Hypertension Brother   . Hypertension Daughter   . Kidney disease Sister        x2  . Kidney disease Brother   . Stroke Neg Hx   . Esophageal cancer Neg Hx   . Colon cancer Neg Hx   . Colon polyps Neg Hx     Social History Social History   Tobacco Use  . Smoking status: Never Smoker  . Smokeless tobacco: Never Used  Substance Use Topics  . Alcohol use: No    Alcohol/week: 0.0 standard drinks  . Drug use: No     Allergies   Patient has no known allergies.   Review of Systems Review of Systems  Unable to perform ROS: Dementia    Gastrointestinal: Positive for abdominal pain.     Physical Exam Updated Vital Signs BP (!) 151/98   Pulse (!) 111   Temp 97.8 F (36.6 C) (Oral)  Resp 14   Wt 48.5 kg   SpO2 98%   BMI 18.94 kg/m   Physical Exam Vitals signs and nursing note reviewed.  Constitutional:      General: She is not in acute distress.    Appearance: She is well-developed.     Comments: Elderly female, in no acute discomfort.  HENT:     Head: Atraumatic.     Mouth/Throat:     Mouth: Mucous membranes are moist.  Eyes:     Conjunctiva/sclera: Conjunctivae normal.  Neck:     Musculoskeletal: Neck supple.  Cardiovascular:     Rate and Rhythm: Rhythm irregular.     Heart sounds: No murmur.  Pulmonary:     Effort: Pulmonary effort is normal.     Breath sounds: Normal breath sounds. No wheezing.  Abdominal:     General: Abdomen is flat. There is no distension.     Tenderness: There is no abdominal tenderness.  Skin:    Findings: No rash.  Neurological:     Mental Status: She is alert.  Psychiatric:        Mood and Affect: Affect is blunt.      ED Treatments / Results  Labs (all labs ordered are listed, but only abnormal results are displayed) Labs Reviewed  CBC WITH DIFFERENTIAL/PLATELET - Abnormal; Notable for the following components:      Result Value   RBC 5.94 (*)    MCV 66.2 (*)    MCH 21.2 (*)    RDW 16.1 (*)    Monocytes Absolute 1.3 (*)    All other components within normal limits  COMPREHENSIVE METABOLIC PANEL - Abnormal; Notable for the following components:   Glucose, Bld 214 (*)    Albumin 3.2 (*)    AST 130 (*)    ALT 112 (*)    Total Bilirubin 1.7 (*)    All other components within normal limits  LIPASE, BLOOD  URINALYSIS, ROUTINE W REFLEX MICROSCOPIC    EKG None  Radiology No results found.  Procedures Procedures (including critical care time)  Medications Ordered in ED Medications  morphine 4 MG/ML injection 4 mg (4 mg Intravenous Given 12/31/18  1818)  metoCLOPramide (REGLAN) injection 10 mg (10 mg Intravenous Given 12/31/18 1818)  lactated ringers bolus 1,000 mL (0 mLs Intravenous Stopped 12/31/18 2049)     Initial Impression / Assessment and Plan / ED Course  I have reviewed the triage vital signs and the nursing notes.  Pertinent labs & imaging results that were available during my care of the patient were reviewed by me and considered in my medical decision making (see chart for details).     BP (!) 151/98   Pulse (!) 111   Temp 97.8 F (36.6 C) (Oral)   Resp 14   Wt 48.5 kg   SpO2 98%   BMI 18.94 kg/m    Final Clinical Impressions(s) / ED Diagnoses   Final diagnoses:  Chronic abdominal pain    ED Discharge Orders    None     6:01 PM Patient has been seen in the ED multiple times for similar complaint including abdominal pain nausea vomiting and occasional diarrhea.  She has had extensive work-up in the past without any definitive finding.  She was seen in the ED yesterday for the same.  She is here complaining of abdominal pain with associate nausea and vomiting.  She is not actively vomiting, vital sign shown hypotension with blood pressure of 167/93 but otherwise no  fever and she is not tachycardic.  Husband mentioned she does receive resolution of her symptoms with morphine and Reglan in the past.  Will recheck labs, provide symptomatic treatment.  She has a soft nontender abdomen on exam.  Do not think additional advanced imaging is indicated at this time.  9:03 PM Labs at baseline.  After receiving treatment, patient felt better.  Patient does request opiate pain medication to go home however I do not think is appropriate in the setting of acute on chronic abdominal pain.  Encourage patient to follow-up closely with primary care provider for further management of her condition.   Domenic Moras, PA-C 12/31/18 2104    Lennice Sites, DO 01/01/19 [phone]

## 2019-01-04 ENCOUNTER — Encounter (INDEPENDENT_AMBULATORY_CARE_PROVIDER_SITE_OTHER): Payer: Self-pay

## 2019-01-04 ENCOUNTER — Ambulatory Visit (INDEPENDENT_AMBULATORY_CARE_PROVIDER_SITE_OTHER): Payer: Medicare HMO | Admitting: Pharmacist

## 2019-01-04 DIAGNOSIS — Z7901 Long term (current) use of anticoagulants: Secondary | ICD-10-CM

## 2019-01-04 DIAGNOSIS — I236 Thrombosis of atrium, auricular appendage, and ventricle as current complications following acute myocardial infarction: Secondary | ICD-10-CM

## 2019-01-04 DIAGNOSIS — Z5181 Encounter for therapeutic drug level monitoring: Secondary | ICD-10-CM | POA: Diagnosis not present

## 2019-01-04 LAB — POCT INR: INR: 1.3 — AB (ref 2.0–3.0)

## 2019-01-04 NOTE — Patient Instructions (Signed)
Take 2 tablets today, 1.5 tablets tomorrow then resume same dosage 1 pill everyday except 1.5 pills on Mondays and Fridays.  Recheck in 1 weeks.  Eat Green vegetables 2 times a week.  [facility] [phone] [address] [phone]

## 2019-01-14 ENCOUNTER — Ambulatory Visit: Payer: Medicare HMO | Admitting: Internal Medicine

## 2019-01-15 ENCOUNTER — Ambulatory Visit (INDEPENDENT_AMBULATORY_CARE_PROVIDER_SITE_OTHER): Payer: Medicare HMO | Admitting: *Deleted

## 2019-01-15 ENCOUNTER — Encounter: Payer: Self-pay | Admitting: *Deleted

## 2019-01-15 DIAGNOSIS — Z5181 Encounter for therapeutic drug level monitoring: Secondary | ICD-10-CM

## 2019-01-15 DIAGNOSIS — I236 Thrombosis of atrium, auricular appendage, and ventricle as current complications following acute myocardial infarction: Secondary | ICD-10-CM | POA: Diagnosis not present

## 2019-01-15 DIAGNOSIS — Z7901 Long term (current) use of anticoagulants: Secondary | ICD-10-CM

## 2019-01-15 LAB — POCT INR: INR: 3.9 — AB (ref 2.0–3.0)

## 2019-01-15 NOTE — Patient Instructions (Signed)
Description   Do not take any Coumadin then resume same dosage 1 pill everyday except 1.5 pills on Mondays and Fridays.  Recheck in 10 days.  Eat Green vegetables 2 times a week.  C[facility] Clinic [phone] Main [phone]

## 2019-02-04 ENCOUNTER — Encounter (HOSPITAL_COMMUNITY): Payer: Self-pay

## 2019-02-04 ENCOUNTER — Emergency Department (HOSPITAL_COMMUNITY)
Admission: EM | Admit: 2019-02-04 | Discharge: 2019-02-05 | Discharge status: Against Medical Advice | Payer: Medicare HMO | Attending: Emergency Medicine | Admitting: Emergency Medicine

## 2019-02-04 ENCOUNTER — Other Ambulatory Visit: Payer: Self-pay

## 2019-02-04 DIAGNOSIS — R109 Unspecified abdominal pain: Secondary | ICD-10-CM | POA: Diagnosis present

## 2019-02-04 DIAGNOSIS — Z5321 Procedure and treatment not carried out due to patient leaving prior to being seen by health care provider: Secondary | ICD-10-CM | POA: Diagnosis not present

## 2019-02-04 LAB — CBC
HCT: 40.7 % (ref 36.0–46.0)
Hemoglobin: 13.2 g/dL (ref 12.0–15.0)
MCH: 21.5 pg — ABNORMAL LOW (ref 26.0–34.0)
MCHC: 32.4 g/dL (ref 30.0–36.0)
MCV: 66.3 fL — ABNORMAL LOW (ref 80.0–100.0)
Platelets: 296 10*3/uL (ref 150–400)
RBC: 6.14 MIL/uL — ABNORMAL HIGH (ref 3.87–5.11)
RDW: 16.5 % — ABNORMAL HIGH (ref 11.5–15.5)
WBC: 5 10*3/uL (ref 4.0–10.5)
nRBC: 0 % (ref 0.0–0.2)

## 2019-02-04 MED ORDER — SODIUM CHLORIDE 0.9% FLUSH: 3.0000 mL | Freq: Once | INTRAVENOUS | Status: DC

## 2019-02-04 NOTE — ED Triage Notes (Signed)
Pt here for nausea and emesis since this afternoon.  Pt ate breakfast but unable to eat rest of meals due to nausea.  Family here states she's dehydrated.

## 2019-02-05 LAB — LIPASE, BLOOD: Lipase: 23 U/L (ref 11–51)

## 2019-02-05 LAB — COMPREHENSIVE METABOLIC PANEL
ALT: 45 U/L — ABNORMAL HIGH (ref 0–44)
AST: 52 U/L — ABNORMAL HIGH (ref 15–41)
Albumin: 3 g/dL — ABNORMAL LOW (ref 3.5–5.0)
Alkaline Phosphatase: 69 U/L (ref 38–126)
Anion gap: 7 (ref 5–15)
BILIRUBIN TOTAL: 1 mg/dL (ref 0.3–1.2)
BUN: 8 mg/dL (ref 8–23)
CO2: 28 mmol/L (ref 22–32)
Calcium: 8.3 mg/dL — ABNORMAL LOW (ref 8.9–10.3)
Chloride: 97 mmol/L — ABNORMAL LOW (ref 98–111)
Creatinine, Ser: 0.78 mg/dL (ref 0.44–1.00)
GFR calc Af Amer: >60 mL/min (ref >60)
GFR calc non Af Amer: >60 mL/min (ref >60)
GLUCOSE: 191 mg/dL — AB (ref 70–99)
Potassium: 3.2 mmol/L — ABNORMAL LOW (ref 3.5–5.1)
Sodium: 132 mmol/L — ABNORMAL LOW (ref 135–145)
Total Protein: 6.4 g/dL — ABNORMAL LOW (ref 6.5–8.1)

## 2019-02-06 ENCOUNTER — Emergency Department (HOSPITAL_COMMUNITY): Payer: Medicare HMO

## 2019-02-06 ENCOUNTER — Encounter (HOSPITAL_COMMUNITY): Payer: Self-pay | Admitting: Emergency Medicine

## 2019-02-06 ENCOUNTER — Other Ambulatory Visit: Payer: Self-pay

## 2019-02-06 ENCOUNTER — Emergency Department (HOSPITAL_COMMUNITY)
Admission: EM | Admit: 2019-02-06 | Discharge: 2019-02-06 | Disposition: A | Discharge status: Home / Self Care | Payer: Medicare HMO | Attending: Emergency Medicine | Admitting: Emergency Medicine

## 2019-02-06 DIAGNOSIS — E119 Type 2 diabetes mellitus without complications: Secondary | ICD-10-CM | POA: Insufficient documentation

## 2019-02-06 DIAGNOSIS — B349 Viral infection, unspecified: Secondary | ICD-10-CM | POA: Diagnosis not present

## 2019-02-06 DIAGNOSIS — I110 Hypertensive heart disease with heart failure: Secondary | ICD-10-CM | POA: Diagnosis not present

## 2019-02-06 DIAGNOSIS — I5041 Acute combined systolic (congestive) and diastolic (congestive) heart failure: Secondary | ICD-10-CM | POA: Diagnosis not present

## 2019-02-06 DIAGNOSIS — Z79899 Other long term (current) drug therapy: Secondary | ICD-10-CM | POA: Diagnosis not present

## 2019-02-06 DIAGNOSIS — R05 Cough: Secondary | ICD-10-CM | POA: Diagnosis present

## 2019-02-06 MED ORDER — OSELTAMIVIR PHOSPHATE 75 MG PO CAPS
75.0000 mg | ORAL_CAPSULE | Freq: Once | ORAL | Status: AC
  Administered 2019-02-06: 75 mg via ORAL
  Filled 2019-02-06: qty 1

## 2019-02-06 MED ORDER — OSELTAMIVIR PHOSPHATE 75 MG PO CAPS: 75.0000 mg | ORAL_CAPSULE | Freq: Two times a day (BID) | ORAL | 0 refills | Status: DC

## 2019-02-06 NOTE — ED Provider Notes (Signed)
[facility] DEPARTMENT Provider Note   CSN: [id number] Arrival date & time: 02/06/19  1016    History   Chief Complaint Chief Complaint  Patient presents with  . Cough    HPI Kristen Lozano is a 63 y.o. female.     Level 5 caveat for inability to give history.  Seen on 02/04/2019 for nausea and vomiting.  Husband today reports a cough.  She has multiple health problems well documented in the medical history.  His only concern is her cough.  He is concerned about the coronavirus.  No recent travel or exposures.  Severity is mild.  The make symptoms better or worse     Past Medical History:  Diagnosis Date  . Acute combined systolic and diastolic HF (heart failure) ([place]) 10/16/2018  . Allergic rhinitis    Requires cetirizine, singulair, and fluticasone.  . Anxiety    Has been on Xanax since 2009. Uses it for stress, anxiety, and insomnia. No contract yet.  . Arthritis   . CHF (congestive heart failure) (HCC)    EF 35% after stroke, presumed ischemic  . Chronic pain    Has OA of knees B. No Xrays in echart. Not requiring narcotics.  . Colitis 12/2017  . Depression   . Diabetes mellitus    Type 2, non insulin dependent. Was dx'd prior to 2008.  Marland Kitchen Hyperglycemia   . Hyperlipemia   . Hypertension   . Sickle cell trait ([place])   . Stroke [facility]) 09/05/14   Dominant left MCA infarcts secondary to unknown embolic source     Patient Active Problem List   Diagnosis Date Noted  . Protein-calorie malnutrition, severe 12/18/2018  . Acute metabolic encephalopathy 81/19/1478  . General weakness 10/25/2018  . Acute combined systolic and diastolic HF (heart failure) ([place]) 10/16/2018  . GERD (gastroesophageal reflux disease) 09/21/2018  . Nausea and vomiting 09/08/2018  . Nausea with vomiting 09/07/2018  . Dyspnea   . Hypomagnesemia   . Sinus tachycardia   . Malnutrition of moderate degree 08/13/2018  . Enteritis   . Pleural effusion   . Nausea  08/11/2018  . Dilated cardiomyopathy ([place])   . Nausea & vomiting 08/09/2018  . 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 Abdominal pain 08/09/2018  . Type II diabetes mellitus, uncontrolled ([place]) 06/03/2018  . Diabetic peripheral neuropathy ([place]) 06/03/2018  . Infectious colitis 01/11/2018  . Hypokalemia 01/11/2018  . Chronic combined systolic and diastolic CHF (congestive heart failure) ([place]) 12/16/2016  . 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 Monitoring for long-term anticoagulant use 11/14/2016  . Hyperlipidemia 11/14/2016  . ACC/AHA stage B congestive heart failure due to ischemic cardiomyopathy ([place]) 11/14/2016  . DKA (diabetic ketoacidoses) ([place]) 11/01/2016  . Diabetes mellitus ([place]) 11/01/2016  . Elevated troponin   . Nonischemic cardiomyopathy ([place])   . Cognitive deficit due to recent stroke 10/18/2014  . History of cerebrovascular accident 10/18/2014  . Abnormal stress test 10/17/2014  . Dysphagia, pharyngoesophageal phase 09/23/2014  . Abnormal CT scan 09/23/2014  . Dilated bile duct 09/23/2014  . Urinary retention 09/07/2014  . 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 Cerebrovascular accident (CVA) due to occlusion of cerebral artery ([place]) 09/05/2014  . Cerebral infarction ([place]) 09/05/2014  . Non compliance w medication regimen 08/10/2012  . Fatigue 02/18/2012  . Essential hypertension 03/22/2011  . Healthcare maintenance 03/22/2011  . Chronic pain   . ALLERGIC RHINITIS, SEASONAL 03/14/2008  . Sickle cell  trait ([place]) 10/31/2006  . Anxiety state 10/31/2006  . DEPRESSION 10/31/2006    Past Surgical History:  Procedure Laterality Date  . BIOPSY  09/23/2018   Procedure: BIOPSY;  Surgeon: Yetta Flock, MD;  Location: [facility] ENDOSCOPY;  Service: Gastroenterology;;  . ESOPHAGOGASTRODUODENOSCOPY (EGD) WITH PROPOFOL N/A 09/23/2018   Procedure: ESOPHAGOGASTRODUODENOSCOPY (EGD) WITH PROPOFOL;  Surgeon: Yetta Flock, MD;  Location: [facility] ENDOSCOPY;  Service: Gastroenterology;  Laterality: N/A;  . RIGHT/LEFT HEART CATH AND CORONARY ANGIOGRAPHY N/A 10/14/2018   Procedure: RIGHT/LEFT HEART CATH AND CORONARY ANGIOGRAPHY;  Surgeon: Troy Sine, MD;  Location: [facility] CV LAB;  Service: Cardiovascular;  Laterality: N/A;  . TEE WITHOUT CARDIOVERSION N/A 09/06/2014   Procedure: TRANSESOPHAGEAL ECHOCARDIOGRAM (TEE);  Surgeon: Pixie Casino, MD;  Location: [facility] ENDOSCOPY;  Service: Cardiovascular;  Laterality: N/A;     OB History   No obstetric history on file.      Home Medications    Prior to Admission medications   Medication Sig Start Date End Date Taking? Authorizing Provider  Acetaminophen 500 MG coapsule Take 500 mg by mouth daily as needed for mild pain or headache.     [provider]  ASPIRIN LOW DOSE 81 MG EC tablet Take 1 tablet (81 mg total) by mouth daily. Patient taking differently: Take 81 mg by mouth daily.  01/22/18   Hilty, Nadean Corwin, MD  atorvastatin (LIPITOR) 20 MG tablet Take 1 tablet (20 mg total) by mouth daily at 6 PM. Please schedule appointment for refills. 01/02/18   Hilty, Nadean Corwin, MD  bisacodyl (DULCOLAX) 10 MG suppository Place 1 suppository (10 mg total) rectally daily as needed for moderate constipation. 12/22/18   Eugenie Filler, MD  docusate sodium (COLACE) 100 MG capsule Take 1 capsule (100 mg total) by mouth 2 (two) times daily as needed for mild constipation. Patient not taking: Reported on 12/09/2018 10/16/18   Isaiah Serge, NP  furosemide (LASIX) 40 MG tablet Take 1 tablet (40 mg total) by mouth daily. 10/17/18   Isaiah Serge, NP  GLUCERNA (GLUCERNA) LIQD Take 237 mLs by mouth daily.    [provider]  guaiFENesin (ROBITUSSIN) 100 MG/5ML SOLN Take 15 mLs (300 mg total) by mouth 4 (four) times daily. Patient taking differently: Take 15 mLs by mouth every 6 (six) hours as needed for cough.  10/16/18   Isaiah Serge, NP  ivabradine  (CORLANOR) 5 MG TABS tablet Take 1 tablet (5 mg total) by mouth 2 (two) times daily with a meal. 12/22/18   Eugenie Filler, MD  LANTUS SOLOSTAR 100 UNIT/ML Solostar Pen Inject 10 Units into the skin at bedtime.  12/09/18   [provider]  losartan (COZAAR) 25 MG tablet Take 0.5 tablets (12.5 mg total) by mouth daily. 10/17/18   Isaiah Serge, NP  metFORMIN (GLUCOPHAGE-XR) 500 MG 24 hr tablet Take 2 tablets (1,000 mg total) by mouth 2 (two) times daily. 10/17/18   Isaiah Serge, NP  metoprolol succinate (TOPROL-XL) 100 MG 24 hr tablet Take 1 tablet (100 mg total) by mouth daily. Take with or immediately following a meal.  In the Morning Patient taking differently: Take 100 mg by mouth every morning. Take with or immediately following a meal.  In the Morning 10/17/18   Isaiah Serge, NP  metoprolol succinate (TOPROL-XL) 50 MG 24 hr tablet Take 1 tablet (50 mg total) by mouth at bedtime. Take with or immediately following a meal. 10/16/18   Dorene Ar,  Otilio Carpen, NP  oseltamivir (TAMIFLU) 75 MG capsule Take 1 capsule (75 mg total) by mouth every 12 (twelve) hours. 02/06/19   Nat Christen, MD  oxyCODONE (OXY IR/ROXICODONE) 5 MG immediate release tablet Take 1 tablet (5 mg total) by mouth every 6 (six) hours as needed for moderate pain. Patient not taking: Reported on 12/09/2018 11/24/18   Dhungel, Flonnie Overman, MD  pantoprazole (PROTONIX) 40 MG tablet Take 1 tablet (40 mg total) by mouth daily with supper. 10/16/18   Isaiah Serge, NP  polyethylene glycol (MIRALAX / GLYCOLAX) packet Take 17 g by mouth daily as needed. Patient taking differently: Take 17 g by mouth daily as needed for mild constipation.  12/22/18   Eugenie Filler, MD  potassium chloride (K-DUR) 10 MEQ tablet Take 1 tablet (10 mEq total) by mouth daily. 09/02/18   Georgette Shell, MD  prochlorperazine (COMPAZINE) 5 MG tablet Take 1 tablet (5 mg total) by mouth 3 (three) times daily before meals. Take 1 tablet 3 times daily  before meals x1 week and then every 6 hours as needed. Patient taking differently: Take 5 mg by mouth 3 (three) times daily before meals. Take 1 tablet 3 times daily before meals x1 week and then every 6 hours as needed for nausea 12/22/18   Eugenie Filler, MD  sucralfate (CARAFATE) 1 GM/10ML suspension Take 10 mLs (1 g total) by mouth 4 (four) times daily -  with meals and at bedtime for 10 days. 10/19/18 12/30/18  Long, Wonda Olds, MD  TRULICITY 5.27 PO/2.4MP SOPN Inject 0.75 mg into the skin once a week. 12/21/18   [provider]  warfarin (COUMADIN) 5 MG tablet Take 1 tablet daily except 1.5 tablets on Mondays and Fridays, Or As Directed  by coumadin clinic 12/25/18   Pixie Casino, MD    Family History Family History  Problem Relation Age of Onset  . Diabetes Mother   . Hypertension Mother   . Heart disease Father   . Heart attack Father   . Hypertension Father   . Diabetes Father   . Ovarian cancer Sister   . Liver cancer Sister   . Sickle cell anemia Daughter   . Schizophrenia Daughter   . Hypertension Sister   . Hypertension Brother   . Hypertension Daughter   . Kidney disease Sister        x2  . Kidney disease Brother   . Stroke Neg Hx   . Esophageal cancer Neg Hx   . Colon cancer Neg Hx   . Colon polyps Neg Hx     Social History Social History   Tobacco Use  . Smoking status: Never Smoker  . Smokeless tobacco: Never Used  Substance Use Topics  . Alcohol use: No    Alcohol/week: 0.0 standard drinks  . Drug use: No     Allergies   Patient has no known allergies.   Review of Systems Review of Systems  Unable to perform ROS: Other (Inability to give history)     Physical Exam Updated Vital Signs BP (!) 162/98 (BP Location: Left Arm)   Pulse 76   Temp 98.2 F (36.8 C) (Oral)   Resp (!) 22   SpO2 100%   Physical Exam Vitals signs and nursing note reviewed.  Constitutional:      Appearance: She is well-developed.     Comments: Baseline  behavior, unable to give full history  HENT:     Head: Normocephalic and atraumatic.  Eyes:  Conjunctiva/sclera: Conjunctivae normal.  Neck:     Musculoskeletal: Neck supple.  Cardiovascular:     Rate and Rhythm: Normal rate and regular rhythm.  Pulmonary:     Effort: Pulmonary effort is normal.     Breath sounds: Normal breath sounds.  Abdominal:     General: Bowel sounds are normal.     Palpations: Abdomen is soft.  Musculoskeletal: Normal range of motion.  Skin:    General: Skin is warm and dry.  Neurological:     Mental Status: She is alert.     Comments: Moving all 4 extremities  Psychiatric:        Behavior: Behavior normal.     Comments: Normal behavior per husband      ED Treatments / Results  Labs (all labs ordered are listed, but only abnormal results are displayed) Labs Reviewed - No data to display  EKG None  Radiology Dg Chest 2 View  Result Date: 02/06/2019 CLINICAL DATA:  Acute cough EXAM: CHEST - 2 VIEW COMPARISON:  12/26/2018 and prior radiograph FINDINGS: Cardiomegaly, pulmonary vascular congestion and mild interstitial opacities noted. Bilateral LOWER lung opacities and small bilateral pleural effusions again noted. No pneumothorax or acute bony abnormality. IMPRESSION: Cardiomegaly with pulmonary vascular congestion and probable very mild interstitial edema. Bilateral LOWER lung opacities/atelectasis and small bilateral pleural effusions. Electronically Signed   By: Margarette Canada M.D.   On: 02/06/2019 11:01    Procedures Procedures (including critical care time)  Medications Ordered in ED Medications  oseltamivir (TAMIFLU) capsule 75 mg (75 mg Oral Given 02/06/19 1310)     Initial Impression / Assessment and Plan / ED Course  I have reviewed the triage vital signs and the nursing notes.  Pertinent labs & imaging results that were available during my care of the patient were reviewed by me and considered in my medical decision making (see chart  for details).        Patient presents with a cough.  No other obvious concerns.  Chest x-ray reviewed.  I suspect a viral syndrome.  Will Rx Tamiflu.  Return if worse.  Final Clinical Impressions(s) / ED Diagnoses   Final diagnoses:  Viral syndrome    ED Discharge Orders         Ordered    oseltamivir (TAMIFLU) 75 MG capsule  Every 12 hours     02/06/19 1316           Nat Christen, MD 02/06/19 1330

## 2019-02-06 NOTE — ED Notes (Signed)
Patient transported to X-ray 

## 2019-02-06 NOTE — ED Notes (Signed)
ED Provider at bedside. 

## 2019-02-06 NOTE — ED Triage Notes (Signed)
Pt reports cough and general malaise, denies n/v.

## 2019-02-06 NOTE — Discharge Instructions (Addendum)
Kristen Lozano may have a very small amount of inflammation in her lungs.  Will start antiviral medication.  You can crush this medicine to give her.  Follow-up with your primary care doctor or return if worse.

## 2019-02-06 NOTE — ED Notes (Signed)
Pt given discharge instructions and follow up information. Pt given the opportunity to ask questions. Pt verbalized understanding.  

## 2019-02-19 ENCOUNTER — Telehealth: Payer: Self-pay | Admitting: Internal Medicine

## 2019-02-19 NOTE — Telephone Encounter (Signed)
Patient spouse called wanting to set up an appt for his wife for coumadin check.  He said she always goes to the [address] location.

## 2019-02-22 NOTE — Telephone Encounter (Signed)
LMOM to schedule drive by INR

## 2019-02-23 ENCOUNTER — Telehealth: Payer: Self-pay | Admitting: Pharmacist

## 2019-02-23 NOTE — Telephone Encounter (Signed)
1. Do you currently have a fever? no (yes = cancel and refer to pcp for e-visit) 2. Have you recently travelled on a cruise, internationally, or to [place], [place], [place], [place], [place], or [place], [place] [employer]) ? no (yes = cancel, stay home, monitor symptoms, and contact pcp or initiate e-visit if symptoms develop) 3. Have you been in contact with someone that is currently pending confirmation of Covid19 testing or has been confirmed to have the [place] virus?  no (yes = cancel, stay home, away from tested individual, monitor symptoms, and contact pcp or initiate e-visit if symptoms develop) 4. Are you currently experiencing fatigue or cough? no (yes = pt should be prepared to have a mask placed at the time of their visit). Cough from fluid in lungs-was evaluated at [facility]  Pt. Advised that we are restricting visitors at this time and anyone present in the vehicle should meet the above criteria as well. Advised that visit will be at curbside for finger stick ONLY and will receive call with instructions. Pt also advised to please bring own pen for signature of arrival document.   Husband will be driving patient- he has answered no to all questions

## 2019-02-23 NOTE — Telephone Encounter (Signed)
Pt called appointment scheduled for 02/25/19 at 3:30pm.

## 2019-02-28 ENCOUNTER — Encounter (HOSPITAL_COMMUNITY): Payer: Self-pay | Admitting: Emergency Medicine

## 2019-02-28 ENCOUNTER — Other Ambulatory Visit: Payer: Self-pay

## 2019-02-28 ENCOUNTER — Emergency Department (HOSPITAL_COMMUNITY): Payer: Medicare HMO

## 2019-02-28 ENCOUNTER — Emergency Department (HOSPITAL_COMMUNITY)
Admission: EM | Admit: 2019-02-28 | Discharge: 2019-02-28 | Disposition: A | Discharge status: Home / Self Care | Payer: Medicare HMO | Attending: Emergency Medicine | Admitting: Emergency Medicine

## 2019-02-28 DIAGNOSIS — Z7901 Long term (current) use of anticoagulants: Secondary | ICD-10-CM | POA: Insufficient documentation

## 2019-02-28 DIAGNOSIS — I10 Essential (primary) hypertension: Secondary | ICD-10-CM | POA: Diagnosis not present

## 2019-02-28 DIAGNOSIS — D573 Sickle-cell trait: Secondary | ICD-10-CM | POA: Diagnosis not present

## 2019-02-28 DIAGNOSIS — I5040 Unspecified combined systolic (congestive) and diastolic (congestive) heart failure: Secondary | ICD-10-CM | POA: Diagnosis not present

## 2019-02-28 DIAGNOSIS — Z79899 Other long term (current) drug therapy: Secondary | ICD-10-CM | POA: Insufficient documentation

## 2019-02-28 DIAGNOSIS — Z7982 Long term (current) use of aspirin: Secondary | ICD-10-CM | POA: Insufficient documentation

## 2019-02-28 DIAGNOSIS — R609 Edema, unspecified: Secondary | ICD-10-CM

## 2019-02-28 DIAGNOSIS — E119 Type 2 diabetes mellitus without complications: Secondary | ICD-10-CM | POA: Diagnosis not present

## 2019-02-28 DIAGNOSIS — Z7984 Long term (current) use of oral hypoglycemic drugs: Secondary | ICD-10-CM | POA: Diagnosis not present

## 2019-02-28 DIAGNOSIS — R600 Localized edema: Secondary | ICD-10-CM | POA: Diagnosis not present

## 2019-02-28 LAB — BASIC METABOLIC PANEL
Anion gap: 11 (ref 5–15)
BUN: 12 mg/dL (ref 8–23)
CO2: 28 mmol/L (ref 22–32)
Calcium: 8.7 mg/dL — ABNORMAL LOW (ref 8.9–10.3)
Chloride: 98 mmol/L (ref 98–111)
Creatinine, Ser: 0.83 mg/dL (ref 0.44–1.00)
GFR calc Af Amer: >60 mL/min (ref >60)
GFR calc non Af Amer: >60 mL/min (ref >60)
Glucose, Bld: 152 mg/dL — ABNORMAL HIGH (ref 70–99)
Potassium: 3.5 mmol/L (ref 3.5–5.1)
Sodium: 137 mmol/L (ref 135–145)

## 2019-02-28 LAB — CBC
HCT: 44.5 % (ref 36.0–46.0)
Hemoglobin: 14.5 g/dL (ref 12.0–15.0)
MCH: 21.6 pg — ABNORMAL LOW (ref 26.0–34.0)
MCHC: 32.6 g/dL (ref 30.0–36.0)
MCV: 66.2 fL — ABNORMAL LOW (ref 80.0–100.0)
Platelets: 254 10*3/uL (ref 150–400)
RBC: 6.72 MIL/uL — ABNORMAL HIGH (ref 3.87–5.11)
RDW: 17.8 % — ABNORMAL HIGH (ref 11.5–15.5)
WBC: 5.7 10*3/uL (ref 4.0–10.5)
nRBC: 0 % (ref 0.0–0.2)

## 2019-02-28 LAB — PROTIME-INR
INR: 6.7 (ref 0.8–1.2)
Prothrombin Time: 57.5 seconds — ABNORMAL HIGH (ref 11.4–15.2)

## 2019-02-28 MED ORDER — FUROSEMIDE 10 MG/ML IJ SOLN
40.0000 mg | Freq: Once | INTRAMUSCULAR | Status: AC
  Administered 2019-02-28: 40 mg via INTRAVENOUS
  Filled 2019-02-28: qty 4

## 2019-02-28 MED ORDER — METOPROLOL TARTRATE 5 MG/5ML IV SOLN: 5.0000 mg | Freq: Once | INTRAVENOUS | Status: DC

## 2019-02-28 NOTE — Discharge Instructions (Addendum)
Your INR is high Do not take Coumadin tonight Call the Coumadin clinic tomorrow and discuss when to restart and have your INR rechecked Eat plenty of leafy greens Keep your feet elevated as much as possible Take Lasix as prescribed Purchase and wear compression stockings tomorrow-sent as electronic prescription to [facility]

## 2019-02-28 NOTE — ED Triage Notes (Addendum)
Patient arrives to ED via POV with her husband who provides the history. Patients husband reports that patient has had edema to her lower legs for the past 4-5 days. He reports that she takes a fluid pill daily but he gave her 2 yesterday and the swelling has improved some today. Patient's husband reports that patient has not reported sob or chest pain or appeared sob with rest or exertion. He also stated patient has not had any cough, fever, chills or any recent sick contacts.

## 2019-02-28 NOTE — ED Notes (Signed)
ED Provider at bedside. 

## 2019-02-28 NOTE — ED Notes (Signed)
Pt up for discharge. Husband was not answering, granddaughter called, arranging transport.

## 2019-02-28 NOTE — ED Provider Notes (Signed)
[facility] EMERGENCY DEPARTMENT Provider Note   CSN: [id number] Arrival date & time: 02/28/19  7829    History   Chief Complaint Chief Complaint  Patient presents with  . Leg Swelling    HPI Kristen Lozano is a 63 y.o. female.     HPI  5 caveat secondary to patient being unable to give her own history 63 year old female history of CHF, colitis, hypertension, hyperlipidemia, sickle cell trait, stroke presents today with reports that she has had increased peripheral swelling.  Patient was transported here via EMS.  History is obtained from EMS providers.  Has been is not able to be in hospital due to covered pandemic.  Attempted to call the husband had been unable to reach him. No travel, sick contacts, or covid exposures reported.  Past Medical History:  Diagnosis Date  . Acute combined systolic and diastolic HF (heart failure) ([place]) 10/16/2018  . Allergic rhinitis    Requires cetirizine, singulair, and fluticasone.  . Anxiety    Has been on Xanax since 2009. Uses it for stress, anxiety, and insomnia. No contract yet.  . Arthritis   . CHF (congestive heart failure) (HCC)    EF 35% after stroke, presumed ischemic  . Chronic pain    Has OA of knees B. No Xrays in echart. Not requiring narcotics.  . Colitis 12/2017  . Depression   . Diabetes mellitus    Type 2, non insulin dependent. Was dx'd prior to 2008.  Marland Kitchen Hyperglycemia   . Hyperlipemia   . Hypertension   . Sickle cell trait ([place])   . Stroke [facility]) 09/05/14   Dominant left MCA infarcts secondary to unknown embolic source     Patient Active Problem List   Diagnosis Date Noted  . Protein-calorie malnutrition, severe 12/18/2018  . Acute metabolic encephalopathy 56/21/3086  . General weakness 10/25/2018  . Acute combined systolic and diastolic HF (heart failure) ([place]) 10/16/2018  . GERD (gastroesophageal reflux disease) 09/21/2018  . Nausea and vomiting 09/08/2018  . Nausea with vomiting  09/07/2018  . Dyspnea   . Hypomagnesemia   . Sinus tachycardia   . Malnutrition of moderate degree 08/13/2018  . Enteritis   . Pleural effusion   . Nausea 08/11/2018  . Dilated cardiomyopathy ([place])   . Nausea & vomiting 08/09/2018  . 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 Abdominal pain 08/09/2018  . Type II diabetes mellitus, uncontrolled ([place]) 06/03/2018  . Diabetic peripheral neuropathy ([place]) 06/03/2018  . Infectious colitis 01/11/2018  . Hypokalemia 01/11/2018  . Chronic combined systolic and diastolic CHF (congestive heart failure) ([place]) 12/16/2016  . 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 Monitoring for long-term anticoagulant use 11/14/2016  . Hyperlipidemia 11/14/2016  . ACC/AHA stage B congestive heart failure due to ischemic cardiomyopathy ([place]) 11/14/2016  . DKA (diabetic ketoacidoses) ([place]) 11/01/2016  . Diabetes mellitus ([place]) 11/01/2016  . Elevated troponin   . Nonischemic cardiomyopathy ([place])   . Cognitive deficit due to recent stroke 10/18/2014  . History of cerebrovascular accident 10/18/2014  . Abnormal stress test 10/17/2014  . Dysphagia, pharyngoesophageal phase 09/23/2014  . Abnormal CT scan 09/23/2014  . Dilated bile duct 09/23/2014  . Urinary retention 09/07/2014  . 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 Cerebrovascular accident (CVA) due to occlusion of cerebral artery ([place]) 09/05/2014  . Cerebral infarction ([place]) 09/05/2014  . Non compliance w medication regimen 08/10/2012  . Fatigue 02/18/2012  . Essential hypertension 03/22/2011  .  Healthcare maintenance 03/22/2011  . Chronic pain   . ALLERGIC RHINITIS, SEASONAL 03/14/2008  . Sickle cell trait ([place]) 10/31/2006  . Anxiety state 10/31/2006  . DEPRESSION 10/31/2006    Past Surgical History:  Procedure Laterality Date  . BIOPSY  09/23/2018   Procedure: BIOPSY;  Surgeon: Yetta Flock, MD;  Location: [facility] ENDOSCOPY;  Service:  Gastroenterology;;  . ESOPHAGOGASTRODUODENOSCOPY (EGD) WITH PROPOFOL N/A 09/23/2018   Procedure: ESOPHAGOGASTRODUODENOSCOPY (EGD) WITH PROPOFOL;  Surgeon: Yetta Flock, MD;  Location: [facility] ENDOSCOPY;  Service: Gastroenterology;  Laterality: N/A;  . RIGHT/LEFT HEART CATH AND CORONARY ANGIOGRAPHY N/A 10/14/2018   Procedure: RIGHT/LEFT HEART CATH AND CORONARY ANGIOGRAPHY;  Surgeon: Troy Sine, MD;  Location: [facility] LAB;  Service: Cardiovascular;  Laterality: N/A;  . TEE WITHOUT CARDIOVERSION N/A 09/06/2014   Procedure: TRANSESOPHAGEAL ECHOCARDIOGRAM (TEE);  Surgeon: Pixie Casino, MD;  Location: [facility] ENDOSCOPY;  Service: Cardiovascular;  Laterality: N/A;     OB History   No obstetric history on file.      Home Medications    Prior to Admission medications   Medication Sig Start Date End Date Taking? Authorizing Provider  Acetaminophen 500 MG coapsule Take 500 mg by mouth daily as needed for mild pain or headache.     [provider]  ASPIRIN LOW DOSE 81 MG EC tablet Take 1 tablet (81 mg total) by mouth daily. Patient taking differently: Take 81 mg by mouth daily.  01/22/18   Hilty, Nadean Corwin, MD  atorvastatin (LIPITOR) 20 MG tablet Take 1 tablet (20 mg total) by mouth daily at 6 PM. Please schedule appointment for refills. 01/02/18   Hilty, Nadean Corwin, MD  bisacodyl (DULCOLAX) 10 MG suppository Place 1 suppository (10 mg total) rectally daily as needed for moderate constipation. 12/22/18   Eugenie Filler, MD  docusate sodium (COLACE) 100 MG capsule Take 1 capsule (100 mg total) by mouth 2 (two) times daily as needed for mild constipation. Patient not taking: Reported on 12/09/2018 10/16/18   Isaiah Serge, NP  furosemide (LASIX) 40 MG tablet Take 1 tablet (40 mg total) by mouth daily. 10/17/18   Isaiah Serge, NP  GLUCERNA (GLUCERNA) LIQD Take 237 mLs by mouth daily.    [provider]  guaiFENesin (ROBITUSSIN) 100 MG/5ML SOLN Take 15 mLs (300 mg  total) by mouth 4 (four) times daily. Patient taking differently: Take 15 mLs by mouth every 6 (six) hours as needed for cough.  10/16/18   Isaiah Serge, NP  ivabradine (CORLANOR) 5 MG TABS tablet Take 1 tablet (5 mg total) by mouth 2 (two) times daily with a meal. 12/22/18   Eugenie Filler, MD  LANTUS SOLOSTAR 100 UNIT/ML Solostar Pen Inject 10 Units into the skin at bedtime.  12/09/18   [provider]  losartan (COZAAR) 25 MG tablet Take 0.5 tablets (12.5 mg total) by mouth daily. 10/17/18   Isaiah Serge, NP  metFORMIN (GLUCOPHAGE-XR) 500 MG 24 hr tablet Take 2 tablets (1,000 mg total) by mouth 2 (two) times daily. 10/17/18   Isaiah Serge, NP  metoprolol succinate (TOPROL-XL) 100 MG 24 hr tablet Take 1 tablet (100 mg total) by mouth daily. Take with or immediately following a meal.  In the Morning Patient taking differently: Take 100 mg by mouth every morning. Take with or immediately following a meal.  In the Morning 10/17/18   Isaiah Serge, NP  metoprolol succinate (TOPROL-XL) 50 MG 24 hr tablet Take 1 tablet (  50 mg total) by mouth at bedtime. Take with or immediately following a meal. 10/16/18   Isaiah Serge, NP  oseltamivir (TAMIFLU) 75 MG capsule Take 1 capsule (75 mg total) by mouth every 12 (twelve) hours. 02/06/19   Nat Christen, MD  oxyCODONE (OXY IR/ROXICODONE) 5 MG immediate release tablet Take 1 tablet (5 mg total) by mouth every 6 (six) hours as needed for moderate pain. Patient not taking: Reported on 12/09/2018 11/24/18   Dhungel, Flonnie Overman, MD  pantoprazole (PROTONIX) 40 MG tablet Take 1 tablet (40 mg total) by mouth daily with supper. 10/16/18   Isaiah Serge, NP  polyethylene glycol (MIRALAX / GLYCOLAX) packet Take 17 g by mouth daily as needed. Patient taking differently: Take 17 g by mouth daily as needed for mild constipation.  12/22/18   Eugenie Filler, MD  potassium chloride (K-DUR) 10 MEQ tablet Take 1 tablet (10 mEq total) by mouth daily. 09/02/18    Georgette Shell, MD  prochlorperazine (COMPAZINE) 5 MG tablet Take 1 tablet (5 mg total) by mouth 3 (three) times daily before meals. Take 1 tablet 3 times daily before meals x1 week and then every 6 hours as needed. Patient taking differently: Take 5 mg by mouth 3 (three) times daily before meals. Take 1 tablet 3 times daily before meals x1 week and then every 6 hours as needed for nausea 12/22/18   Eugenie Filler, MD  sucralfate (CARAFATE) 1 GM/10ML suspension Take 10 mLs (1 g total) by mouth 4 (four) times daily -  with meals and at bedtime for 10 days. 10/19/18 12/30/18  Long, Wonda Olds, MD  TRULICITY 9.62 IW/9.7LG SOPN Inject 0.75 mg into the skin once a week. 12/21/18   [provider]  warfarin (COUMADIN) 5 MG tablet Take 1 tablet daily except 1.5 tablets on Mondays and Fridays, Or As Directed  by coumadin clinic 12/25/18   Pixie Casino, MD    Family History Family History  Problem Relation Age of Onset  . Diabetes Mother   . Hypertension Mother   . Heart disease Father   . Heart attack Father   . Hypertension Father   . Diabetes Father   . Ovarian cancer Sister   . Liver cancer Sister   . Sickle cell anemia Daughter   . Schizophrenia Daughter   . Hypertension Sister   . Hypertension Brother   . Hypertension Daughter   . Kidney disease Sister        x2  . Kidney disease Brother   . Stroke Neg Hx   . Esophageal cancer Neg Hx   . Colon cancer Neg Hx   . Colon polyps Neg Hx     Social History Social History   Tobacco Use  . Smoking status: Never Smoker  . Smokeless tobacco: Never Used  Substance Use Topics  . Alcohol use: No    Alcohol/week: 0.0 standard drinks  . Drug use: No     Allergies   Patient has no known allergies.   Review of Systems Review of Systems  Unable to perform ROS: Other     Physical Exam Updated Vital Signs BP 138/84   Pulse 93   Temp 98.6 F (37 C) (Oral)   Resp 16   SpO2 96%   Physical Exam Vitals signs and  nursing note reviewed.  Constitutional:      Appearance: Normal appearance.  HENT:     Head: Normocephalic and atraumatic.     Right Ear: External  ear normal.     Left Ear: External ear normal.     Nose: Nose normal.     Mouth/Throat:     Mouth: Mucous membranes are moist.  Eyes:     Pupils: Pupils are equal, round, and reactive to light.  Neck:     Musculoskeletal: Normal range of motion.  Cardiovascular:     Rate and Rhythm: Normal rate and regular rhythm.  Pulmonary:     Effort: Pulmonary effort is normal.  Abdominal:     General: Abdomen is flat.     Palpations: Abdomen is soft.  Musculoskeletal:        General: Swelling present. No tenderness or signs of injury.  Skin:    General: Skin is warm and dry.     Capillary Refill: Capillary refill takes less than 2 seconds.  Neurological:     General: No focal deficit present.     Mental Status: She is alert. Mental status is at baseline.     Comments: Patient awake and oriented to person and place      ED Treatments / Results  Labs (all labs ordered are listed, but only abnormal results are displayed) Labs Reviewed  CBC - Abnormal; Notable for the following components:      Result Value   RBC 6.72 (*)    MCV 66.2 (*)    MCH 21.6 (*)    RDW 17.8 (*)    All other components within normal limits  PROTIME-INR - Abnormal; Notable for the following components:   Prothrombin Time 57.5 (*)    INR 6.7 (*)    All other components within normal limits  BASIC METABOLIC PANEL    EKG EKG Interpretation  Date/Time:  Sunday February 28 2019 10:06:18 EDT Ventricular Rate:  97 PR Interval:    QRS Duration: 96 QT Interval:  424 QTC Calculation: 539 R Axis:   91 Text Interpretation:  Sinus rhythm Biatrial enlargement Right axis deviation  left ventricular hypertrophy Nonspecific T abnormalities, lateral leads No significant change since last tracing Confirmed by Virgel Manifold [id number]) on 02/28/2019 11:57:38 AM   Radiology Dg  Chest Port 1 View  Result Date: 02/28/2019 CLINICAL DATA:  Lower extremity edema past 4-5 days. EXAM: PORTABLE CHEST 1 VIEW COMPARISON:  02/06/2019; 12/26/2018; 12/09/2018; chest CT-10/11/2018 FINDINGS: Grossly unchanged enlarged cardiac silhouette and mediastinal contours with atherosclerotic plaque within the thoracic aorta. The pulmonary vasculature appears indistinct with cephalization of flow. Grossly unchanged trace/small layering bilateral effusions with associated bibasilar heterogeneous/consolidative opacities. No focal airspace opacities. No pleural effusion or pneumothorax. No acute osseous abnormalities. IMPRESSION: Similar findings of cardiomegaly, pulmonary edema, small/trace pleural effusions and associated bibasilar opacities, atelectasis versus infiltrate. Electronically Signed   By: Sandi Mariscal M.D.   On: 02/28/2019 10:51    Procedures Procedures (including critical care time)  Medications Ordered in ED Medications - No data to display   Initial Impression / Assessment and Plan / ED Course  I have reviewed the triage vital signs and the nursing notes.  Pertinent labs & imaging results that were available during my care of the patient were reviewed by me and considered in my medical decision making (see chart for details).        This is a 63 year old female who was sent home with reports that she was here due to increased swelling of her legs.  EMS report there has been such as increased swelling of her legs.  I attempted to call the husband multiple times.  Number  I have had for him was [phone].  Nurse reports that she spoke to him.  However I have called and has not been answered.  I have also attempted to call the number listed in the chart which is [phone]. Elevated INR- plan hold coumadin tonight and call primary tomorrow for instructions    Discussed with Sister Inez Catalina low, phone number is [phone] She reports that she is patient's power of attorney She  reports patient has had difficulty walking and the pain her legs from increased swelling despite Lasix We discussed using compression stockings. INR is back and significantly elevated at 6.7 Patient's hemoglobin is normal at 14.5 Awaiting chemistries   Final Clinical Impressions(s) / ED Diagnoses   Final diagnoses:  None    ED Discharge Orders    None       Pattricia Boss, MD 03/01/19 1022

## 2019-02-28 NOTE — ED Notes (Addendum)
All appropriate discharge materials reviewed at length with patient and care giver. Time for questions provided to care giver. Pt caregiver has no other questions at this time and verbalizes understanding of all provided materials. The granddaughter is the pt ride.

## 2019-03-10 ENCOUNTER — Telehealth: Payer: Self-pay

## 2019-03-10 NOTE — Telephone Encounter (Signed)
lmom for prescreen and drive thru aware  

## 2019-03-15 ENCOUNTER — Other Ambulatory Visit: Payer: Self-pay | Admitting: Cardiology

## 2019-03-15 NOTE — Telephone Encounter (Signed)
Not sure why this refill came to me I am not on Dr. Lysbeth Penner team.  But refill them.  She needs follow up with him as well.  Was in ER on 02/27/19  - telehealth should be ok but her husband or sister would have to be on call and her sister is POA.

## 2019-03-19 ENCOUNTER — Other Ambulatory Visit: Payer: Self-pay | Admitting: Cardiology

## 2019-03-25 ENCOUNTER — Ambulatory Visit (INDEPENDENT_AMBULATORY_CARE_PROVIDER_SITE_OTHER): Payer: Medicare HMO

## 2019-03-25 ENCOUNTER — Other Ambulatory Visit: Payer: Self-pay

## 2019-03-25 ENCOUNTER — Telehealth: Payer: Self-pay | Admitting: Cardiology

## 2019-03-25 DIAGNOSIS — I236 Thrombosis of atrium, auricular appendage, and ventricle as current complications following acute myocardial infarction: Secondary | ICD-10-CM

## 2019-03-25 DIAGNOSIS — Z5181 Encounter for therapeutic drug level monitoring: Secondary | ICD-10-CM

## 2019-03-25 DIAGNOSIS — Z7901 Long term (current) use of anticoagulants: Secondary | ICD-10-CM

## 2019-03-25 LAB — POCT INR: INR: 4 — AB (ref 2.0–3.0)

## 2019-03-25 NOTE — Telephone Encounter (Signed)
Opened by accident

## 2019-03-25 NOTE — Patient Instructions (Signed)
Description   Called spoke with pt's husband, advised to have pt skip today's dosage of Coumadin, then start taking 1 pill everyday except 1.5 pills on Mondays.  Recheck in 2 weeks.  Eat Green vegetables 2 times a week.  [facility] [phone] Main [phone]

## 2019-04-07 ENCOUNTER — Other Ambulatory Visit: Payer: Self-pay | Admitting: Cardiology

## 2019-04-07 ENCOUNTER — Telehealth: Payer: Self-pay

## 2019-04-07 NOTE — Telephone Encounter (Signed)
lmom for prescreen  

## 2019-04-08 ENCOUNTER — Other Ambulatory Visit: Payer: Self-pay

## 2019-04-08 ENCOUNTER — Ambulatory Visit (INDEPENDENT_AMBULATORY_CARE_PROVIDER_SITE_OTHER): Payer: Medicare HMO | Admitting: *Deleted

## 2019-04-08 DIAGNOSIS — I236 Thrombosis of atrium, auricular appendage, and ventricle as current complications following acute myocardial infarction: Secondary | ICD-10-CM

## 2019-04-08 DIAGNOSIS — Z7901 Long term (current) use of anticoagulants: Secondary | ICD-10-CM

## 2019-04-08 DIAGNOSIS — Z5181 Encounter for therapeutic drug level monitoring: Secondary | ICD-10-CM

## 2019-04-08 LAB — POCT INR: INR: 1.9 — AB (ref 2.0–3.0)

## 2019-04-08 NOTE — Telephone Encounter (Signed)
This is Dr. Hilty's pt 

## 2019-04-08 NOTE — Patient Instructions (Signed)
Description   Called spoke with pt's husband, Gwenlyn Perking (on [place]), advised to have pt take 1.5 pills today, then continue taking 1 pill everyday except 1.5 pills on Mondays.  Recheck in 2 weeks.  Eat Green vegetables 2 times a week.  [facility] [phone] Main [phone]

## 2019-04-10 NOTE — Telephone Encounter (Signed)
Kristen Lozano Dr.Hilty needs virtual visit with this pt or APP.  Ok to fill meds but she needs to have some type visit, either phone or video.   Thanks Mickel Baas

## 2019-04-12 NOTE — Telephone Encounter (Signed)
Med refilled. Message routed to [facility] scheduling pool to contact patient for appointment.

## 2019-04-21 ENCOUNTER — Telehealth: Payer: Self-pay

## 2019-04-21 NOTE — Telephone Encounter (Signed)
[place]

## 2019-04-22 ENCOUNTER — Telehealth: Payer: Self-pay | Admitting: Internal Medicine

## 2019-04-22 NOTE — Telephone Encounter (Signed)
Kristen Lozano I have called this patient and left her a VM to call and schedule this appointment.

## 2019-04-22 NOTE — Telephone Encounter (Signed)
Called patient and LVM to call back to schedule appointment.

## 2019-05-07 ENCOUNTER — Other Ambulatory Visit: Payer: Self-pay | Admitting: Internal Medicine

## 2019-05-07 NOTE — Telephone Encounter (Signed)
1. COVID-19 Pre-Screening Questions:  . In the past 7 to 10 days have you had a cough,  shortness of breath, headache, congestion, fever (100 or greater) body aches, chills, sore throat, or sudden loss of taste or sense of smell? No . Have you been around anyone with known Covid 19.  No . Have you been around anyone who is awaiting Covid 19 test results in the past 7 to 10 days?  No . Have you been around anyone who has been exposed to Covid 19, or has mentioned symptoms of Covid 19 within the past 7 to 10 days?  No    2. Pt advised of visitor restrictions (no visitors allowed except if needed to conduct the visit). Also advised to arrive at appointment time and wear a mask. Pt is aware we are no longer doing drive thru and that appt will be upstairs in the office.

## 2019-05-11 ENCOUNTER — Ambulatory Visit (INDEPENDENT_AMBULATORY_CARE_PROVIDER_SITE_OTHER): Payer: Medicare HMO | Admitting: *Deleted

## 2019-05-11 ENCOUNTER — Other Ambulatory Visit: Payer: Self-pay

## 2019-05-11 DIAGNOSIS — Z7901 Long term (current) use of anticoagulants: Secondary | ICD-10-CM | POA: Diagnosis not present

## 2019-05-11 DIAGNOSIS — I236 Thrombosis of atrium, auricular appendage, and ventricle as current complications following acute myocardial infarction: Secondary | ICD-10-CM | POA: Diagnosis not present

## 2019-05-11 DIAGNOSIS — Z5181 Encounter for therapeutic drug level monitoring: Secondary | ICD-10-CM | POA: Diagnosis not present

## 2019-05-11 LAB — POCT INR: INR: 2.5 (ref 2.0–3.0)

## 2019-05-11 NOTE — Patient Instructions (Signed)
Description   Continue taking 1 pill everyday except 1.5 pills on Mondays.  Recheck in 6 weeks.  Eat Green vegetables 2 times a week.  [facility] Clinic [phone] Main [phone]

## 2019-06-01 ENCOUNTER — Other Ambulatory Visit: Payer: Self-pay | Admitting: Cardiology

## 2019-06-04 ENCOUNTER — Other Ambulatory Visit: Payer: Self-pay | Admitting: Cardiology

## 2019-06-04 NOTE — Telephone Encounter (Signed)
Rx(s) sent to pharmacy electronically.  

## 2019-06-10 ENCOUNTER — Ambulatory Visit
Admission: RE | Admit: 2019-06-10 | Discharge: 2019-06-10 | Disposition: A | Discharge status: Home / Self Care | Payer: Medicare HMO | Source: Ambulatory Visit | Attending: Internal Medicine | Admitting: Internal Medicine

## 2019-06-10 ENCOUNTER — Other Ambulatory Visit: Payer: Self-pay

## 2019-06-10 ENCOUNTER — Other Ambulatory Visit: Payer: Self-pay | Admitting: Internal Medicine

## 2019-06-10 ENCOUNTER — Inpatient Hospital Stay (HOSPITAL_COMMUNITY)
Admission: EM | Admit: 2019-06-10 | Discharge: 2019-06-13 | DRG: 293 | Disposition: A | Discharge status: Home / Self Care | Payer: Medicare Other | Attending: Internal Medicine | Admitting: Internal Medicine

## 2019-06-10 ENCOUNTER — Emergency Department (HOSPITAL_COMMUNITY): Payer: Medicare Other

## 2019-06-10 DIAGNOSIS — I420 Dilated cardiomyopathy: Secondary | ICD-10-CM | POA: Diagnosis present

## 2019-06-10 DIAGNOSIS — Z79899 Other long term (current) drug therapy: Secondary | ICD-10-CM | POA: Diagnosis not present

## 2019-06-10 DIAGNOSIS — D573 Sickle-cell trait: Secondary | ICD-10-CM | POA: Diagnosis not present

## 2019-06-10 DIAGNOSIS — I110 Hypertensive heart disease with heart failure: Secondary | ICD-10-CM | POA: Diagnosis not present

## 2019-06-10 DIAGNOSIS — I5023 Acute on chronic systolic (congestive) heart failure: Secondary | ICD-10-CM | POA: Diagnosis present

## 2019-06-10 DIAGNOSIS — E876 Hypokalemia: Secondary | ICD-10-CM

## 2019-06-10 DIAGNOSIS — F418 Other specified anxiety disorders: Secondary | ICD-10-CM | POA: Diagnosis not present

## 2019-06-10 DIAGNOSIS — F411 Generalized anxiety disorder: Secondary | ICD-10-CM | POA: Diagnosis present

## 2019-06-10 DIAGNOSIS — K219 Gastro-esophageal reflux disease without esophagitis: Secondary | ICD-10-CM | POA: Diagnosis present

## 2019-06-10 DIAGNOSIS — E119 Type 2 diabetes mellitus without complications: Secondary | ICD-10-CM

## 2019-06-10 DIAGNOSIS — G894 Chronic pain syndrome: Secondary | ICD-10-CM | POA: Diagnosis present

## 2019-06-10 DIAGNOSIS — E1142 Type 2 diabetes mellitus with diabetic polyneuropathy: Secondary | ICD-10-CM | POA: Diagnosis not present

## 2019-06-10 DIAGNOSIS — I10 Essential (primary) hypertension: Secondary | ICD-10-CM | POA: Diagnosis present

## 2019-06-10 DIAGNOSIS — Z7901 Long term (current) use of anticoagulants: Secondary | ICD-10-CM | POA: Diagnosis not present

## 2019-06-10 DIAGNOSIS — Z7982 Long term (current) use of aspirin: Secondary | ICD-10-CM

## 2019-06-10 DIAGNOSIS — K7460 Unspecified cirrhosis of liver: Secondary | ICD-10-CM

## 2019-06-10 DIAGNOSIS — E109 Type 1 diabetes mellitus without complications: Secondary | ICD-10-CM | POA: Diagnosis not present

## 2019-06-10 DIAGNOSIS — Z832 Family history of diseases of the blood and blood-forming organs and certain disorders involving the immune mechanism: Secondary | ICD-10-CM

## 2019-06-10 DIAGNOSIS — Z794 Long term (current) use of insulin: Secondary | ICD-10-CM

## 2019-06-10 DIAGNOSIS — Z8673 Personal history of transient ischemic attack (TIA), and cerebral infarction without residual deficits: Secondary | ICD-10-CM

## 2019-06-10 DIAGNOSIS — I5021 Acute systolic (congestive) heart failure: Secondary | ICD-10-CM | POA: Diagnosis not present

## 2019-06-10 DIAGNOSIS — I509 Heart failure, unspecified: Secondary | ICD-10-CM

## 2019-06-10 DIAGNOSIS — G8929 Other chronic pain: Secondary | ICD-10-CM | POA: Diagnosis present

## 2019-06-10 DIAGNOSIS — Z20828 Contact with and (suspected) exposure to other viral communicable diseases: Secondary | ICD-10-CM | POA: Diagnosis present

## 2019-06-10 DIAGNOSIS — E785 Hyperlipidemia, unspecified: Secondary | ICD-10-CM | POA: Diagnosis not present

## 2019-06-10 DIAGNOSIS — M7989 Other specified soft tissue disorders: Secondary | ICD-10-CM

## 2019-06-10 LAB — CBC WITH DIFFERENTIAL/PLATELET
Abs Immature Granulocytes: 0.01 10*3/uL (ref 0.00–0.07)
Basophils Absolute: 0.1 10*3/uL (ref 0.0–0.1)
Basophils Relative: 1 %
Eosinophils Absolute: 0.1 10*3/uL (ref 0.0–0.5)
Eosinophils Relative: 1 %
HCT: 45.3 % (ref 36.0–46.0)
Hemoglobin: 14.6 g/dL (ref 12.0–15.0)
Immature Granulocytes: 0 %
Lymphocytes Relative: 19 %
Lymphs Abs: 1.2 10*3/uL (ref 0.7–4.0)
MCH: 22.1 pg — ABNORMAL LOW (ref 26.0–34.0)
MCHC: 32.2 g/dL (ref 30.0–36.0)
MCV: 68.4 fL — ABNORMAL LOW (ref 80.0–100.0)
Monocytes Absolute: 0.7 10*3/uL (ref 0.1–1.0)
Monocytes Relative: 12 %
Neutro Abs: 4.2 10*3/uL (ref 1.7–7.7)
Neutrophils Relative %: 67 %
Platelets: 234 10*3/uL (ref 150–400)
RBC: 6.62 MIL/uL — ABNORMAL HIGH (ref 3.87–5.11)
RDW: 18.1 % — ABNORMAL HIGH (ref 11.5–15.5)
WBC: 6.3 10*3/uL (ref 4.0–10.5)
nRBC: 0 % (ref 0.0–0.2)

## 2019-06-10 LAB — BASIC METABOLIC PANEL
Anion gap: 11 (ref 5–15)
BUN: 8 mg/dL (ref 8–23)
CO2: 29 mmol/L (ref 22–32)
Calcium: 8.1 mg/dL — ABNORMAL LOW (ref 8.9–10.3)
Chloride: 101 mmol/L (ref 98–111)
Creatinine, Ser: 1 mg/dL (ref 0.44–1.00)
GFR calc Af Amer: >60 mL/min (ref >60)
GFR calc non Af Amer: 59 mL/min — ABNORMAL LOW (ref >60)
Glucose, Bld: 80 mg/dL (ref 70–99)
Potassium: 2.7 mmol/L — CL (ref 3.5–5.1)
Sodium: 141 mmol/L (ref 135–145)

## 2019-06-10 LAB — MAGNESIUM: Magnesium: 1.3 mg/dL — ABNORMAL LOW (ref 1.7–2.4)

## 2019-06-10 LAB — CBG MONITORING, ED: Glucose-Capillary: 117 mg/dL — ABNORMAL HIGH (ref 70–99)

## 2019-06-10 LAB — BRAIN NATRIURETIC PEPTIDE: B Natriuretic Peptide: 1557 pg/mL — ABNORMAL HIGH (ref 0.0–100.0)

## 2019-06-10 MED ORDER — POLYETHYLENE GLYCOL 3350 17 G PO PACK: 17.0000 g | PACK | Freq: Every day | ORAL | Status: DC | PRN

## 2019-06-10 MED ORDER — ACETAMINOPHEN 325 MG PO TABS: 650.0000 mg | ORAL_TABLET | ORAL | Status: DC | PRN

## 2019-06-10 MED ORDER — PANTOPRAZOLE SODIUM 40 MG PO TBEC
40.0000 mg | DELAYED_RELEASE_TABLET | Freq: Every day | ORAL | Status: DC
  Administered 2019-06-11 – 2019-06-12 (×2): 40 mg via ORAL
  Filled 2019-06-10 (×2): qty 1

## 2019-06-10 MED ORDER — SODIUM CHLORIDE 0.9% FLUSH: 3.0000 mL | INTRAVENOUS | Status: DC | PRN

## 2019-06-10 MED ORDER — INSULIN ASPART 100 UNIT/ML ~~LOC~~ SOLN
0.0000 [IU] | Freq: Three times a day (TID) | SUBCUTANEOUS | Status: DC
  Administered 2019-06-11: 1 [IU] via SUBCUTANEOUS
  Administered 2019-06-12 (×2): 2 [IU] via SUBCUTANEOUS
  Administered 2019-06-12: 07:00:00 1 [IU] via SUBCUTANEOUS
  Administered 2019-06-13: 3 [IU] via SUBCUTANEOUS

## 2019-06-10 MED ORDER — POTASSIUM CHLORIDE CRYS ER 20 MEQ PO TBCR
40.0000 meq | EXTENDED_RELEASE_TABLET | Freq: Once | ORAL | Status: AC
  Administered 2019-06-11: 40 meq via ORAL
  Filled 2019-06-10: qty 2

## 2019-06-10 MED ORDER — ATORVASTATIN CALCIUM 10 MG PO TABS: 20.0000 mg | ORAL_TABLET | Freq: Every day | ORAL | Status: DC

## 2019-06-10 MED ORDER — IVABRADINE HCL 5 MG PO TABS
5.0000 mg | ORAL_TABLET | Freq: Two times a day (BID) | ORAL | Status: DC
  Administered 2019-06-11 – 2019-06-13 (×5): 5 mg via ORAL
  Filled 2019-06-10 (×6): qty 1

## 2019-06-10 MED ORDER — OXYCODONE HCL 5 MG PO TABS: 5.0000 mg | ORAL_TABLET | Freq: Four times a day (QID) | ORAL | Status: DC | PRN

## 2019-06-10 MED ORDER — INSULIN GLARGINE 100 UNIT/ML ~~LOC~~ SOLN
10.0000 [IU] | Freq: Every day | SUBCUTANEOUS | Status: DC
  Filled 2019-06-10 (×2): qty 0.1

## 2019-06-10 MED ORDER — GUAIFENESIN 100 MG/5ML PO SOLN: 15.0000 mL | Freq: Four times a day (QID) | ORAL | Status: DC | PRN

## 2019-06-10 MED ORDER — ACETAMINOPHEN 500 MG PO TABS: 500.0000 mg | ORAL_TABLET | Freq: Every day | ORAL | Status: DC | PRN

## 2019-06-10 MED ORDER — METOPROLOL SUCCINATE ER 100 MG PO TB24
100.0000 mg | ORAL_TABLET | Freq: Every day | ORAL | Status: DC
  Administered 2019-06-11 – 2019-06-13 (×3): 100 mg via ORAL
  Filled 2019-06-10 (×3): qty 1

## 2019-06-10 MED ORDER — BISACODYL 10 MG RE SUPP: 10.0000 mg | Freq: Every day | RECTAL | Status: DC | PRN

## 2019-06-10 MED ORDER — POTASSIUM CHLORIDE CRYS ER 10 MEQ PO TBCR
10.0000 meq | EXTENDED_RELEASE_TABLET | Freq: Every day | ORAL | Status: DC
  Administered 2019-06-12 – 2019-06-13 (×2): 10 meq via ORAL
  Filled 2019-06-10 (×3): qty 1

## 2019-06-10 MED ORDER — ASPIRIN EC 81 MG PO TBEC
81.0000 mg | DELAYED_RELEASE_TABLET | Freq: Every day | ORAL | Status: DC
  Administered 2019-06-11 – 2019-06-13 (×3): 81 mg via ORAL
  Filled 2019-06-10 (×4): qty 1

## 2019-06-10 MED ORDER — FUROSEMIDE 10 MG/ML IJ SOLN
40.0000 mg | Freq: Once | INTRAMUSCULAR | Status: AC
  Administered 2019-06-10: 40 mg via INTRAVENOUS
  Filled 2019-06-10: qty 4

## 2019-06-10 MED ORDER — LOSARTAN POTASSIUM 25 MG PO TABS
12.5000 mg | ORAL_TABLET | Freq: Every day | ORAL | Status: DC
  Administered 2019-06-11 – 2019-06-13 (×3): 12.5 mg via ORAL
  Filled 2019-06-10 (×3): qty 1

## 2019-06-10 MED ORDER — ONDANSETRON HCL 4 MG/2ML IJ SOLN
4.0000 mg | Freq: Four times a day (QID) | INTRAMUSCULAR | Status: DC | PRN
  Administered 2019-06-12: 4 mg via INTRAVENOUS
  Filled 2019-06-10: qty 2

## 2019-06-10 MED ORDER — PROCHLORPERAZINE MALEATE 5 MG PO TABS: 5.0000 mg | ORAL_TABLET | Freq: Four times a day (QID) | ORAL | Status: DC | PRN

## 2019-06-10 MED ORDER — DOCUSATE SODIUM 100 MG PO CAPS: 100.0000 mg | ORAL_CAPSULE | Freq: Two times a day (BID) | ORAL | Status: DC | PRN

## 2019-06-10 MED ORDER — INSULIN ASPART 100 UNIT/ML ~~LOC~~ SOLN: 0.0000 [IU] | Freq: Every day | SUBCUTANEOUS | Status: DC

## 2019-06-10 MED ORDER — SUCRALFATE 1 GM/10ML PO SUSP
1.0000 g | Freq: Three times a day (TID) | ORAL | Status: DC
  Administered 2019-06-11 – 2019-06-13 (×10): 1 g via ORAL
  Filled 2019-06-10 (×9): qty 10

## 2019-06-10 MED ORDER — MAGNESIUM SULFATE 2 GM/50ML IV SOLN
2.0000 g | Freq: Once | INTRAVENOUS | Status: AC
  Administered 2019-06-10: 2 g via INTRAVENOUS
  Filled 2019-06-10: qty 50

## 2019-06-10 MED ORDER — METOPROLOL SUCCINATE ER 50 MG PO TB24
50.0000 mg | ORAL_TABLET | Freq: Every day | ORAL | Status: DC
  Administered 2019-06-11 – 2019-06-12 (×2): 50 mg via ORAL
  Filled 2019-06-10 (×2): qty 1

## 2019-06-10 MED ORDER — FUROSEMIDE 10 MG/ML IJ SOLN
40.0000 mg | Freq: Two times a day (BID) | INTRAMUSCULAR | Status: DC
  Administered 2019-06-11: 08:00:00 40 mg via INTRAVENOUS
  Filled 2019-06-10: qty 4

## 2019-06-10 MED ORDER — SODIUM CHLORIDE 0.9% FLUSH
3.0000 mL | Freq: Two times a day (BID) | INTRAVENOUS | Status: DC
  Administered 2019-06-11 – 2019-06-13 (×5): 3 mL via INTRAVENOUS

## 2019-06-10 MED ORDER — POTASSIUM CHLORIDE 10 MEQ/100ML IV SOLN
10.0000 meq | INTRAVENOUS | Status: AC
  Administered 2019-06-10 (×2): 10 meq via INTRAVENOUS
  Filled 2019-06-10 (×2): qty 100

## 2019-06-10 MED ORDER — SODIUM CHLORIDE 0.9 % IV SOLN
250.0000 mL | INTRAVENOUS | Status: DC | PRN
  Administered 2019-06-10: 250 mL via INTRAVENOUS

## 2019-06-10 MED ORDER — ENSURE ENLIVE PO LIQD
237.0000 mL | Freq: Every day | ORAL | Status: DC
  Administered 2019-06-11 – 2019-06-13 (×3): 237 mL via ORAL

## 2019-06-10 NOTE — ED Triage Notes (Addendum)
Per Pt's family she was seen from primary dr today with excessive fluid built up in her legs.Pt is taking Lasix. Having confusion this morning.  Pt's sugars were low this morning according to family member. Pt is not having any sob, chest pain. No cough or fevers.

## 2019-06-10 NOTE — ED Notes (Signed)
ED TO INPATIENT HANDOFF REPORT  ED Nurse Name and Phone #:  [phone]  S Name/Age/Gender Kristen Lozano 63 y.o. female Room/Bed: 037C/037C  Code Status   Code Status: Prior  Home/SNF/Other Home Patient oriented to: self Is this baseline? No   Triage Complete: Triage complete  Chief Complaint fluid retention  Triage Note Per Pt's family she was seen from primary dr today with excessive fluid built up in her legs.Pt is taking Lasix. Having confusion this morning.  Pt's sugars were low this morning according to family member. Pt is not having any sob, chest pain. No cough or fevers.    Allergies No Known Allergies  Level of Care/Admitting Diagnosis ED Disposition    ED Disposition Condition [facility]: [place] [[id number]]  Level of Care: Telemetry Cardiac [103]  Covid Evaluation: Asymptomatic Screening Protocol (No Symptoms)  Diagnosis: Acute CHF (congestive heart failure) ([place]) [315176]  Admitting Physician: Elwyn Reach [[id number]]  Attending Physician: Elwyn Reach [[id number]]  Estimated length of stay: past midnight tomorrow  Certification:: I certify this patient will need inpatient services for at least 2 midnights  PT Class (Do Not Modify): Inpatient [101]  PT Acc Code (Do Not Modify): Private [1]       B Medical/Surgery History Past Medical History:  Diagnosis Date  . 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 Allergic rhinitis    Requires cetirizine, singulair, and fluticasone.  . Anxiety    Has been on Xanax since 2009. Uses it for stress, anxiety, and insomnia. No contract yet.  . Arthritis   . CHF (congestive heart failure) ([facility])    EF 35% after stroke, presumed ischemic  . Chronic pain    Has OA of knees B. No Xrays in echart. Not requiring narcotics.  . Colitis 12/2017  . Depression   . Diabetes mellitus    Type 2, non insulin dependent. Was dx'd prior to 2008.  [facility]  Hyperglycemia   . Hyperlipemia   . Hypertension   . Sickle cell trait ([place])   . 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 BIOPSY  09/23/2018   Procedure: BIOPSY;  Surgeon: Yetta Flock, MD;  Location: [facility] ENDOSCOPY;  Service: Gastroenterology;;  . ESOPHAGOGASTRODUODENOSCOPY (EGD) WITH PROPOFOL N/A 09/23/2018   Procedure: ESOPHAGOGASTRODUODENOSCOPY (EGD) WITH PROPOFOL;  Surgeon: Yetta Flock, MD;  Location: [facility] ENDOSCOPY;  Service: Gastroenterology;  Laterality: N/A;  . RIGHT/LEFT HEART CATH AND CORONARY ANGIOGRAPHY N/A 10/14/2018   Procedure: RIGHT/LEFT HEART CATH AND CORONARY ANGIOGRAPHY;  Surgeon: Troy Sine, MD;  Location: [place] CV LAB;  Service: Cardiovascular;  Laterality: N/A;  . TEE WITHOUT CARDIOVERSION N/A 09/06/2014   Procedure: TRANSESOPHAGEAL ECHOCARDIOGRAM (TEE);  Surgeon: Pixie Casino, MD;  Location: [facility] ENDOSCOPY;  Service: Cardiovascular;  Laterality: N/A;     A IV Location/Drains/Wounds Patient Lines/Drains/Airways Status   Active Line/Drains/Airways    Name:   Placement date:   Placement time:   Site:   Days:   Peripheral IV 06/10/19 Left Forearm   06/10/19    2042    Forearm   less than 1          Intake/Output Last 24 hours  Intake/Output Summary (Last 24 hours) at 06/10/2019 2249 Last data filed at 06/10/2019 2158 Gross per 24 hour  Intake 89.3 ml  Output -  Net 89.3 ml    Labs/Imaging Results for orders placed or performed during the hospital encounter of 06/10/19 (from the past 48 hour(s))  CBG monitoring, ED     Status: Abnormal   Collection Time: 06/10/19  8:04 PM  Result Value Ref Range   Glucose-Capillary 117 (H) 70 - 99 mg/dL  Brain natriuretic peptide     Status: Abnormal   Collection Time: 06/10/19  8:09 PM  Result Value Ref Range   B Natriuretic Peptide 1,557.0 (H) 0.0 - 100.0 pg/mL    Comment: Performed at  [facility] [address] [place], [place] [address]  Basic metabolic panel     Status: Abnormal   Collection Time: 06/10/19  8:09 PM  Result Value Ref Range   Sodium 141 135 - 145 mmol/L   Potassium 2.7 (LL) 3.5 - 5.1 mmol/L    Comment: CRITICAL RESULT CALLED TO, READ BACK BY AND VERIFIED WITH: PEREZ P,RN 06/10/19 2044 WAYK    Chloride 101 98 - 111 mmol/L   CO2 29 22 - 32 mmol/L   Glucose, Bld 80 70 - 99 mg/dL   BUN 8 8 - 23 mg/dL   Creatinine, Ser 1.00 0.44 - 1.00 mg/dL   Calcium 8.1 (L) 8.9 - 10.3 mg/dL   GFR calc non Af Amer 59 (L) >60 mL/min   GFR calc Af Amer >60 >60 mL/min   Anion gap 11 5 - 15    Comment: Performed at [facility], [place] [address] [place], [place] [address]  CBC with Differential     Status: Abnormal   Collection Time: 06/10/19  8:09 PM  Result Value Ref Range   WBC 6.3 4.0 - 10.5 K/uL   RBC 6.62 (H) 3.87 - 5.11 MIL/uL   Hemoglobin 14.6 12.0 - 15.0 g/dL   HCT 45.3 36.0 - 46.0 %   MCV 68.4 (L) 80.0 - 100.0 fL    Comment: REPEATED TO VERIFY   MCH 22.1 (L) 26.0 - 34.0 pg   MCHC 32.2 30.0 - 36.0 g/dL   RDW 18.1 (H) 11.5 - 15.5 %   Platelets 234 150 - 400 K/uL    Comment: REPEATED TO VERIFY CONSISTENT WITH PREVIOUS RESULT    nRBC 0.0 0.0 - 0.2 %   Neutrophils Relative % 67 %   Neutro Abs 4.2 1.7 - 7.7 K/uL   Lymphocytes Relative 19 %   Lymphs Abs 1.2 0.7 - 4.0 K/uL   Monocytes Relative 12 %   Monocytes Absolute 0.7 0.1 - 1.0 K/uL   Eosinophils Relative 1 %   Eosinophils Absolute 0.1 0.0 - 0.5 K/uL   Basophils Relative 1 %   Basophils Absolute 0.1 0.0 - 0.1 K/uL   Immature Granulocytes 0 %   Abs Immature Granulocytes 0.01 0.00 - 0.07 K/uL    Comment: Performed at [facility], [address], [place], [place] [address]  Magnesium     Status: Abnormal   Collection Time: 06/10/19  8:49 PM  Result Value Ref Range   Magnesium 1.3 (L) 1.7 - 2.4 mg/dL    Comment: Performed at [facility] [address] [place],  [place] [address]   Dg Chest 2 View  Result Date: 06/10/2019 CLINICAL DATA:  Increasing shortness of breath. EXAM: CHEST - 2 VIEW COMPARISON:  February 28, 2019 FINDINGS: Small bilateral pleural effusions with underlying opacities are identified, more prominent the interval. Stable cardiomegaly. No other changes. IMPRESSION: Increasing bilateral pleural effusions with underlying  opacities. No other acute abnormalities. Electronically Signed   By: Dorise Bullion III M.D   On: 06/10/2019 10:52   Dg Chest Portable 1 View  Result Date: 06/10/2019 CLINICAL DATA:  Increased lower extremity edema EXAM: PORTABLE CHEST 1 VIEW COMPARISON:  06/10/2019, 02/28/2019, 02/06/2019 FINDINGS: Small moderate bilateral pleural effusions. Prominent cardiomegaly with vascular congestion and slight worsening of interstitial pulmonary edema. Persistent bibasilar consolidations. Right-sided effusion appears slightly increased. Aortic atherosclerosis. IMPRESSION: 1. Small moderate bilateral pleural effusions, slightly increased on the right side 2. Cardiomegaly with vascular congestion and slight worsening of interstitial pulmonary edema 3. Continued dense atelectasis or pneumonia at both bases Electronically Signed   By: Donavan Foil M.D.   On: 06/10/2019 21:31    Pending Labs Unresulted Labs (From admission, onward)    Start     Ordered   06/10/19 2217  SARS Coronavirus 2 (CEPHEID - Performed in [facility] hospital lab), Hosp Order  (Asymptomatic Patients Labs)  Once,   STAT    Question:  Rule Out  Answer:  Yes   06/10/19 2216          Vitals/Pain Today's Vitals   06/10/19 2004 06/10/19 2030 06/10/19 2041 06/10/19 2158  BP:  137/86    Pulse:  (!) 105    Resp:  (!) 25    Temp:      TempSrc:      SpO2:  100%    PainSc: 0-No pain  0-No pain 0-No pain    Isolation Precautions No active isolations  Medications Medications  furosemide (LASIX) injection 40 mg (has no administration in time range)  potassium chloride  10 mEq in 100 mL IVPB (0 mEq Intravenous Stopped 06/10/19 2158)    Mobility non-ambulatory Low fall risk   Focused Assessments Cardiac Assessment Handoff:  Cardiac Rhythm: Normal sinus rhythm Lab Results  Component Value Date   CKTOTAL 112 09/22/2018   TROPONINI 1.27 ([place]) 12/17/2018   Lab Results  Component Value Date   DDIMER <0.27 11/21/2018   Does the Patient currently have chest pain? No     R Recommendations: See Admitting Provider Note  Report given to:   Additional Notes:

## 2019-06-10 NOTE — ED Provider Notes (Signed)
[facility] EMERGENCY DEPARTMENT Provider Note   CSN: [id number] Arrival date & time: 06/10/19  1952     History   Chief Complaint Chief Complaint  Patient presents with  . Leg Swelling    FLUID RETENTION    HPI Kristen Lozano is a 63 y.o. female.     HPI Patient is a 63 year old female with past medical history of HFrEF (last EF 20 to 25%), HTN, T2DM, HLD, previous CVA, chronic pain and sickle cell trait who presents to the emergency department today for evaluation of increasing bilateral lower extremity edema over the last several weeks.  She denies any current shortness of breath, chest pain or palpitations. Denies any fever or chills.  Patient is accompanied by her husband at the bedside, who provided majority of the patient's history.  Patient's husband states that they saw the patient's PCP either yesterday or earlier today and that she was given "a shot" that is described to be most likely a diuretic, he says that they were then told to come to the emergency department where she could get a an IV medicine that could "take away all the fluid in her legs once and for all."   Past Medical History:  Diagnosis Date  . Acute combined systolic and diastolic HF (heart failure) ([place]) 10/16/2018  . Allergic rhinitis    Requires cetirizine, singulair, and fluticasone.  . Anxiety    Has been on Xanax since 2009. Uses it for stress, anxiety, and insomnia. No contract yet.  . Arthritis   . CHF (congestive heart failure) (HCC)    EF 35% after stroke, presumed ischemic  . Chronic pain    Has OA of knees B. No Xrays in echart. Not requiring narcotics.  . Colitis 12/2017  . Depression   . Diabetes mellitus    Type 2, non insulin dependent. Was dx'd prior to 2008.  Marland Kitchen Hyperglycemia   . Hyperlipemia   . Hypertension   . Sickle cell trait ([place])   . Stroke [facility]) 09/05/14   Dominant left MCA infarcts secondary to unknown embolic source     Patient Active Problem List    Diagnosis Date Noted  . Protein-calorie malnutrition, severe 12/18/2018  . Acute metabolic encephalopathy 37/16/9678  . General weakness 10/25/2018  . Acute combined systolic and diastolic HF (heart failure) ([place]) 10/16/2018  . GERD (gastroesophageal reflux disease) 09/21/2018  . Nausea and vomiting 09/08/2018  . Nausea with vomiting 09/07/2018  . Dyspnea   . Hypomagnesemia   . Sinus tachycardia   . Malnutrition of moderate degree 08/13/2018  . Enteritis   . Pleural effusion   . Nausea 08/11/2018  . Dilated cardiomyopathy ([place])   . Nausea & vomiting 08/09/2018  . Type 2 diabetes mellitus with vascular disease ([place]) 08/09/2018  . Abdominal pain 08/09/2018  . Type II diabetes mellitus, uncontrolled ([place]) 06/03/2018  . Diabetic peripheral neuropathy ([place]) 06/03/2018  . Infectious colitis 01/11/2018  . Hypokalemia 01/11/2018  . Chronic combined systolic and diastolic CHF (congestive heart failure) ([place]) 12/16/2016  . [place] thrombus of heart following MI ([place]) 11/14/2016  . Monitoring for long-term anticoagulant use 11/14/2016  . Hyperlipidemia 11/14/2016  . ACC/AHA stage B congestive heart failure due to ischemic cardiomyopathy ([place]) 11/14/2016  . DKA (diabetic ketoacidoses) ([place]) 11/01/2016  . Diabetes mellitus ([place]) 11/01/2016  . Elevated troponin   . Nonischemic cardiomyopathy ([place])   . Cognitive deficit due to recent stroke 10/18/2014  . History of cerebrovascular accident 10/18/2014  . Abnormal  stress test 10/17/2014  . Dysphagia, pharyngoesophageal phase 09/23/2014  . Abnormal CT scan 09/23/2014  . Dilated bile duct 09/23/2014  . Urinary retention 09/07/2014  . Secondary cardiomyopathy ([place]) 09/06/2014  . Cerebrovascular accident (CVA) due to occlusion of cerebral artery ([place]) 09/05/2014  . Cerebral infarction ([place]) 09/05/2014  . Non compliance w medication regimen 08/10/2012  . Fatigue 02/18/2012  . Essential hypertension 03/22/2011  . Healthcare maintenance  03/22/2011  . Chronic pain   . ALLERGIC RHINITIS, SEASONAL 03/14/2008  . Sickle cell trait ([place]) 10/31/2006  . Anxiety state 10/31/2006  . DEPRESSION 10/31/2006    Past Surgical History:  Procedure Laterality Date  . BIOPSY  09/23/2018   Procedure: BIOPSY;  Surgeon: Yetta Flock, MD;  Location: [facility] ENDOSCOPY;  Service: Gastroenterology;;  . ESOPHAGOGASTRODUODENOSCOPY (EGD) WITH PROPOFOL N/A 09/23/2018   Procedure: ESOPHAGOGASTRODUODENOSCOPY (EGD) WITH PROPOFOL;  Surgeon: Yetta Flock, MD;  Location: [facility] ENDOSCOPY;  Service: Gastroenterology;  Laterality: N/A;  . RIGHT/LEFT HEART CATH AND CORONARY ANGIOGRAPHY N/A 10/14/2018   Procedure: RIGHT/LEFT HEART CATH AND CORONARY ANGIOGRAPHY;  Surgeon: Troy Sine, MD;  Location: [facility] CV LAB;  Service: Cardiovascular;  Laterality: N/A;  . TEE WITHOUT CARDIOVERSION N/A 09/06/2014   Procedure: TRANSESOPHAGEAL ECHOCARDIOGRAM (TEE);  Surgeon: Pixie Casino, MD;  Location: [facility] ENDOSCOPY;  Service: Cardiovascular;  Laterality: N/A;     OB History   No obstetric history on file.      Home Medications    Prior to Admission medications   Medication Sig Start Date End Date Taking? Authorizing Provider  Acetaminophen 500 MG coapsule Take 500 mg by mouth daily as needed for mild pain or headache.     [provider]  ASPIRIN LOW DOSE 81 MG EC tablet Take 1 tablet (81 mg total) by mouth daily. Patient taking differently: Take 81 mg by mouth daily.  01/22/18   Hilty, Nadean Corwin, MD  atorvastatin (LIPITOR) 20 MG tablet Take 1 tablet (20 mg total) by mouth daily at 6 PM. Please schedule appointment for refills. 01/02/18   Hilty, Nadean Corwin, MD  bisacodyl (DULCOLAX) 10 MG suppository Place 1 suppository (10 mg total) rectally daily as needed for moderate constipation. 12/22/18   Eugenie Filler, MD  docusate sodium (COLACE) 100 MG capsule Take 1 capsule (100 mg total) by mouth 2 (two) times daily as needed for mild  constipation. Patient not taking: Reported on 12/09/2018 10/16/18   Isaiah Serge, NP  furosemide (LASIX) 40 MG tablet Take 1 tablet (40 mg total) by mouth daily. <PLEASE MAKE APPOINTMENT FOR REFILLS> 05/07/19   Hilty, Nadean Corwin, MD  GLUCERNA (GLUCERNA) LIQD Take 237 mLs by mouth daily.    [provider]  guaiFENesin (ROBITUSSIN) 100 MG/5ML SOLN Take 15 mLs (300 mg total) by mouth 4 (four) times daily. Patient taking differently: Take 15 mLs by mouth every 6 (six) hours as needed for cough.  10/16/18   Isaiah Serge, NP  ivabradine (CORLANOR) 5 MG TABS tablet Take 1 tablet (5 mg total) by mouth 2 (two) times daily with a meal. 12/22/18   Eugenie Filler, MD  LANTUS SOLOSTAR 100 UNIT/ML Solostar Pen Inject 10 Units into the skin at bedtime.  12/09/18   [provider]  losartan (COZAAR) 25 MG tablet Take 0.5 tablets (12.5 mg total) by mouth daily. <PLEASE MAKE APPOINTMENT FOR REFILLS> 05/07/19   Hilty, Nadean Corwin, MD  metFORMIN (GLUCOPHAGE-XR) 500 MG 24 hr tablet Take 2 tablets (1,000 mg total) by mouth  2 (two) times daily. 10/17/18   Isaiah Serge, NP  metoprolol succinate (TOPROL-XL) 100 MG 24 hr tablet Take 1 tablet (100 mg total) by mouth every morning. Take with or immediately following a meal. In the Morning 05/07/19   Hilty, Nadean Corwin, MD  metoprolol succinate (TOPROL-XL) 50 MG 24 hr tablet Take 1 tablet (50 mg total) by mouth at bedtime. Take with or immediately following a meal. <PLEASE MAKE APPOINTMENT FOR REFILLS> 05/07/19   Hilty, Nadean Corwin, MD  oseltamivir (TAMIFLU) 75 MG capsule Take 1 capsule (75 mg total) by mouth every 12 (twelve) hours. 02/06/19   Nat Christen, MD  oxyCODONE (OXY IR/ROXICODONE) 5 MG immediate release tablet Take 1 tablet (5 mg total) by mouth every 6 (six) hours as needed for moderate pain. Patient not taking: Reported on 12/09/2018 11/24/18   Dhungel, Flonnie Overman, MD  pantoprazole (PROTONIX) 40 MG tablet Take 1 tablet (40 mg total) by mouth daily with  supper. 06/04/19   Hilty, Nadean Corwin, MD  polyethylene glycol (MIRALAX / GLYCOLAX) packet Take 17 g by mouth daily as needed. Patient taking differently: Take 17 g by mouth daily as needed for mild constipation.  12/22/18   Eugenie Filler, MD  potassium chloride (K-DUR) 10 MEQ tablet Take 1 tablet (10 mEq total) by mouth daily. 09/02/18   Georgette Shell, MD  prochlorperazine (COMPAZINE) 5 MG tablet Take 1 tablet (5 mg total) by mouth 3 (three) times daily before meals. Take 1 tablet 3 times daily before meals x1 week and then every 6 hours as needed. Patient taking differently: Take 5 mg by mouth 3 (three) times daily before meals. Take 1 tablet 3 times daily before meals x1 week and then every 6 hours as needed for nausea 12/22/18   Eugenie Filler, MD  sucralfate (CARAFATE) 1 GM/10ML suspension Take 10 mLs (1 g total) by mouth 4 (four) times daily -  with meals and at bedtime for 10 days. 10/19/18 12/30/18  Long, Wonda Olds, MD  TRULICITY 3.29 JJ/8.8CZ SOPN Inject 0.75 mg into the skin once a week. 12/21/18   [provider]  warfarin (COUMADIN) 5 MG tablet Take 1 tablet daily except 1.5 tablets on Mondays and Fridays, Or As Directed  by coumadin clinic 12/25/18   Pixie Casino, MD    Family History Family History  Problem Relation Age of Onset  . Diabetes Mother   . Hypertension Mother   . Heart disease Father   . Heart attack Father   . Hypertension Father   . Diabetes Father   . Ovarian cancer Sister   . Liver cancer Sister   . Sickle cell anemia Daughter   . Schizophrenia Daughter   . Hypertension Sister   . Hypertension Brother   . Hypertension Daughter   . Kidney disease Sister        x2  . Kidney disease Brother   . Stroke Neg Hx   . Esophageal cancer Neg Hx   . Colon cancer Neg Hx   . Colon polyps Neg Hx     Social History Social History   Tobacco Use  . Smoking status: Never Smoker  . Smokeless tobacco: Never Used  Substance Use Topics  . Alcohol  use: No    Alcohol/week: 0.0 standard drinks  . Drug use: No     Allergies   Patient has no known allergies.   Review of Systems Review of Systems ROS negative except for that described above in the HPI  Physical Exam Updated Vital Signs BP 137/86   Pulse (!) 105   Temp 98.3 F (36.8 C) (Oral)   Resp (!) 25   SpO2 100%   Physical Exam Vitals signs and nursing note reviewed.  Constitutional:      General: She is not in acute distress.    Appearance: She is well-developed.  HENT:     Head: Normocephalic and atraumatic.     Right Ear: External ear normal.     Left Ear: External ear normal.  Eyes:     Conjunctiva/sclera: Conjunctivae normal.  Neck:     Musculoskeletal: Neck supple.  Cardiovascular:     Rate and Rhythm: Regular rhythm. Tachycardia present.     Pulses: Normal pulses.     Heart sounds: No murmur.     Comments: Lower extremity pitting edema is 2+ all the way up to the patient's knees Pulmonary:     Effort: Pulmonary effort is normal. No respiratory distress.     Breath sounds: Normal air entry. Decreased breath sounds (throughout) present.  Abdominal:     Palpations: Abdomen is soft.     Tenderness: There is no abdominal tenderness.  Musculoskeletal:     Right lower leg: 2+ Pitting Edema present.     Left lower leg: 2+ Pitting Edema present.  Skin:    General: Skin is warm and dry.  Neurological:     Mental Status: She is alert.      ED Treatments / Results  Labs (all labs ordered are listed, but only abnormal results are displayed) Labs Reviewed  BRAIN NATRIURETIC PEPTIDE - Abnormal; Notable for the following components:      Result Value   B Natriuretic Peptide 1,557.0 (*)    All other components within normal limits  BASIC METABOLIC PANEL - Abnormal; Notable for the following components:   Potassium 2.7 (*)    Calcium 8.1 (*)    GFR calc non Af Amer 59 (*)    All other components within normal limits  CBC WITH DIFFERENTIAL/PLATELET -  Abnormal; Notable for the following components:   RBC 6.62 (*)    MCV 68.4 (*)    MCH 22.1 (*)    RDW 18.1 (*)    All other components within normal limits  MAGNESIUM - Abnormal; Notable for the following components:   Magnesium 1.3 (*)    All other components within normal limits  BASIC METABOLIC PANEL - Abnormal; Notable for the following components:   Potassium 3.0 (*)    Calcium 8.0 (*)    All other components within normal limits  CBC WITH DIFFERENTIAL/PLATELET - Abnormal; Notable for the following components:   RBC 6.38 (*)    MCV 67.1 (*)    MCH 21.8 (*)    RDW 17.8 (*)    All other components within normal limits  GLUCOSE, CAPILLARY - Abnormal; Notable for the following components:   Glucose-Capillary 53 (*)    All other components within normal limits  GLUCOSE, CAPILLARY - Abnormal; Notable for the following components:   Glucose-Capillary 53 (*)    All other components within normal limits  PROTIME-INR - Abnormal; Notable for the following components:   Prothrombin Time 26.9 (*)    INR 2.5 (*)    All other components within normal limits  BASIC METABOLIC PANEL - Abnormal; Notable for the following components:   Potassium 3.3 (*)    Glucose, Bld 61 (*)    Calcium 8.0 (*)    All other components within  normal limits  GLUCOSE, CAPILLARY - Abnormal; Notable for the following components:   Glucose-Capillary 135 (*)    All other components within normal limits  GLUCOSE, CAPILLARY - Abnormal; Notable for the following components:   Glucose-Capillary 121 (*)    All other components within normal limits  PROTIME-INR - Abnormal; Notable for the following components:   Prothrombin Time 34.2 (*)    INR 3.4 (*)    All other components within normal limits  GLUCOSE, CAPILLARY - Abnormal; Notable for the following components:   Glucose-Capillary 143 (*)    All other components within normal limits  CBG MONITORING, ED - Abnormal; Notable for the following components:    Glucose-Capillary 117 (*)    All other components within normal limits  SARS CORONAVIRUS 2 (HOSPITAL ORDER, [facility])  GLUCOSE, CAPILLARY  MAGNESIUM  GLUCOSE, CAPILLARY  COMPREHENSIVE METABOLIC PANEL  CBC  MAGNESIUM    EKG EKG Interpretation  Date/Time:  Thursday June 10 2019 19:58:04 EDT Ventricular Rate:  103 PR Interval:  160 QRS Duration: 78 QT Interval:  328 QTC Calculation: 429 R Axis:   62 Text Interpretation:  Sinus tachycardia Nonspecific T wave abnormality No significant change since last tracing Confirmed by Lajean Saver [phone]) on 06/10/2019 11:16:17 PM   Radiology Dg Chest 2 View  Result Date: 06/10/2019 CLINICAL DATA:  Increasing shortness of breath. EXAM: CHEST - 2 VIEW COMPARISON:  February 28, 2019 FINDINGS: Small bilateral pleural effusions with underlying opacities are identified, more prominent the interval. Stable cardiomegaly. No other changes. IMPRESSION: Increasing bilateral pleural effusions with underlying opacities. No other acute abnormalities. Electronically Signed   By: Dorise Bullion III M.D   On: 06/10/2019 10:52   Dg Chest Portable 1 View  Result Date: 06/10/2019 CLINICAL DATA:  Increased lower extremity edema EXAM: PORTABLE CHEST 1 VIEW COMPARISON:  06/10/2019, 02/28/2019, 02/06/2019 FINDINGS: Small moderate bilateral pleural effusions. Prominent cardiomegaly with vascular congestion and slight worsening of interstitial pulmonary edema. Persistent bibasilar consolidations. Right-sided effusion appears slightly increased. Aortic atherosclerosis. IMPRESSION: 1. Small moderate bilateral pleural effusions, slightly increased on the right side 2. Cardiomegaly with vascular congestion and slight worsening of interstitial pulmonary edema 3. Continued dense atelectasis or pneumonia at both bases Electronically Signed   By: Donavan Foil M.D.   On: 06/10/2019 21:31    Procedures Procedures (including critical care time)   Medications Ordered in ED Medications  0.9 %  sodium chloride infusion (250 mLs Intravenous New Bag/Given 06/10/19 2358)  potassium chloride 10 mEq in 100 mL IVPB (0 mEq Intravenous Stopped 06/10/19 2256)  furosemide (LASIX) injection 40 mg (40 mg Intravenous Given 06/10/19 2257)  potassium chloride SA (K-DUR) CR tablet 40 mEq (40 mEq Oral Given 06/11/19 0006)  magnesium sulfate IVPB 2 g 50 mL (2 g Intravenous New Bag/Given 06/10/19 2359)     Initial Impression / Assessment and Plan / ED Course  I have reviewed the triage vital signs and the nursing notes.  Pertinent labs & imaging results that were available during my care of the patient were reviewed by me and considered in my medical decision making (see chart for details).  Differentials considered: Community-acquired pneumonia, acute heart failure exacerbation, malnutrition  EM Physician interpretation of Labs & Imaging: . CBC grossly unremarkable . BMP with serum hypokalemia with potassium 2.7 . Elevated serum BNP at 1,557 . Hypomagnesemia with serum magnesium 1.3  Medical Decision Making:  KOREA SEVERS is a 63 y.o. female history as above significant for  HFrEF (last EF 20-25%) who presented to the emergency department today with her husband for further evaluation and treatment of aggressive bilateral lower extremity edema.  She arrived afebrile mildly tachycardic but otherwise hemodynamically stable.  She had a significant serum hypokalemia and evidence of acute heart failure exacerbation.  She will be admitted to the triad hospitalist service for further inpatient management and care.   The plan for this patient was discussed with my attending physician, Dr. Lajean Saver, who voiced agreement and who oversaw evaluation and treatment of this patient.   CLINICAL IMPRESSION: 1. Cirrhosis of liver with ascites, unspecified hepatic cirrhosis type (HCC)   2. Leg swelling   3. Hypokalemia   4. Hypomagnesemia      Disposition: Admit   Shabana Armentrout A. Jimmye Norman, MD Resident Physician, PGY-3 Emergency Medicine [facility] of Medicine    Jefm Petty, MD 06/12/19 2094    Lajean Saver, MD 06/14/19 [phone]

## 2019-06-10 NOTE — ED Notes (Signed)
Husband Cell #- [phone]

## 2019-06-10 NOTE — H&P (Signed)
History and Physical   Kristen Lozano LKG:[id number] DOB: 1956/07/27 DOA: 06/10/2019  Referring MD/NP/PA: Dr. Ashok Cordia  PCP: Audley Hose, MD   Outpatient Specialists: None  Patient coming from: Home  Chief Complaint: Lower extremity edema with shortness of breath  HPI: Kristen Lozano is a 63 y.o. female with medical history significant of systolic dysfunction CHF with EF of 20%, hypertension, previous CVA, diabetes, hypertension, depression with anxiety, chronic pain and sickle cell trait who presented to the ER with progressive shortness of breath and leg swelling.  Patient has worsening edema.  She has been on Lasix and other cardiac medications.  She called her PCP yesterday to complaint.  She was asked to take her medications but the swelling has persisted and gotten worse so she decided to come to the ER.  Patient denied any chest pain.  She had persistent orthopnea and PND.  She was noted to have fluid overload and is being admitted for treatment..  ED Course: Temperature is 98.3 blood pressure 120/94 pulse 105 respiratory 25 oxygen sat 99% on room air.  Potassium is 2.7 otherwise chemistry appears to be within normal proBNP 1557, CBC essentially within normal.  Chest x-ray shows small moderate bilateral pleural effusions slightly increased on the right side.  Also cardiac megaly with vascular congestion.  Atelectasis at both bases.  Patient initiated on IV Lasix and being admitted for treatment.  Review of Systems: As per HPI otherwise 10 point review of systems negative.    Past Medical History:  Diagnosis Date  . Acute combined systolic and diastolic HF (heart failure) ([place]) 10/16/2018  . Allergic rhinitis    Requires cetirizine, singulair, and fluticasone.  . Anxiety    Has been on Xanax since 2009. Uses it for stress, anxiety, and insomnia. No contract yet.  . Arthritis   . CHF (congestive heart failure) (HCC)    EF 35% after stroke, presumed ischemic  . Chronic  pain    Has OA of knees B. No Xrays in echart. Not requiring narcotics.  . Colitis 12/2017  . Depression   . Diabetes mellitus    Type 2, non insulin dependent. Was dx'd prior to 2008.  Marland Kitchen Hyperglycemia   . Hyperlipemia   . Hypertension   . Sickle cell trait ([place])   . 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 BIOPSY  09/23/2018   Procedure: BIOPSY;  Surgeon: Yetta Flock, MD;  Location: [facility] ENDOSCOPY;  Service: Gastroenterology;;  . ESOPHAGOGASTRODUODENOSCOPY (EGD) WITH PROPOFOL N/A 09/23/2018   Procedure: ESOPHAGOGASTRODUODENOSCOPY (EGD) WITH PROPOFOL;  Surgeon: Yetta Flock, MD;  Location: [facility] ENDOSCOPY;  Service: Gastroenterology;  Laterality: N/A;  . RIGHT/LEFT HEART CATH AND CORONARY ANGIOGRAPHY N/A 10/14/2018   Procedure: RIGHT/LEFT HEART CATH AND CORONARY ANGIOGRAPHY;  Surgeon: Troy Sine, MD;  Location: [place] [facility];  Service: Cardiovascular;  Laterality: N/A;  . TEE WITHOUT CARDIOVERSION N/A 09/06/2014   Procedure: TRANSESOPHAGEAL ECHOCARDIOGRAM (TEE);  Surgeon: Pixie Casino, MD;  Location: [facility] ENDOSCOPY;  Service: Cardiovascular;  Laterality: N/A;     reports that she has never smoked. She has never used smokeless tobacco. She reports that she does not drink alcohol or use drugs.  No Known Allergies  Family History  Problem Relation Age of Onset  . Diabetes Mother   . Hypertension Mother   . Heart disease Father   . Heart  attack Father   . Hypertension Father   . Diabetes Father   . Ovarian cancer Sister   . Liver cancer Sister   . Sickle cell anemia Daughter   . Schizophrenia Daughter   . Hypertension Sister   . Hypertension Brother   . Hypertension Daughter   . Kidney disease Sister        x2  . Kidney disease Brother   . Stroke Neg Hx   . Esophageal cancer Neg Hx   . Colon cancer Neg Hx   . Colon polyps Neg Hx      Prior  to Admission medications   Medication Sig Start Date End Date Taking? Authorizing Provider  Acetaminophen 500 MG coapsule Take 500 mg by mouth daily as needed for mild pain or headache.     [provider]  ASPIRIN LOW DOSE 81 MG EC tablet Take 1 tablet (81 mg total) by mouth daily. Patient taking differently: Take 81 mg by mouth daily.  01/22/18   Hilty, Nadean Corwin, MD  atorvastatin (LIPITOR) 20 MG tablet Take 1 tablet (20 mg total) by mouth daily at 6 PM. Please schedule appointment for refills. 01/02/18   Hilty, Nadean Corwin, MD  bisacodyl (DULCOLAX) 10 MG suppository Place 1 suppository (10 mg total) rectally daily as needed for moderate constipation. 12/22/18   Eugenie Filler, MD  docusate sodium (COLACE) 100 MG capsule Take 1 capsule (100 mg total) by mouth 2 (two) times daily as needed for mild constipation. Patient not taking: Reported on 12/09/2018 10/16/18   Isaiah Serge, NP  furosemide (LASIX) 40 MG tablet Take 1 tablet (40 mg total) by mouth daily. <PLEASE MAKE APPOINTMENT FOR REFILLS> 05/07/19   Hilty, Nadean Corwin, MD  GLUCERNA (GLUCERNA) LIQD Take 237 mLs by mouth daily.    [provider]  guaiFENesin (ROBITUSSIN) 100 MG/5ML SOLN Take 15 mLs (300 mg total) by mouth 4 (four) times daily. Patient taking differently: Take 15 mLs by mouth every 6 (six) hours as needed for cough.  10/16/18   Isaiah Serge, NP  ivabradine (CORLANOR) 5 MG TABS tablet Take 1 tablet (5 mg total) by mouth 2 (two) times daily with a meal. 12/22/18   Eugenie Filler, MD  LANTUS SOLOSTAR 100 UNIT/ML Solostar Pen Inject 10 Units into the skin at bedtime.  12/09/18   [provider]  losartan (COZAAR) 25 MG tablet Take 0.5 tablets (12.5 mg total) by mouth daily. <PLEASE MAKE APPOINTMENT FOR REFILLS> 05/07/19   Hilty, Nadean Corwin, MD  metFORMIN (GLUCOPHAGE-XR) 500 MG 24 hr tablet Take 2 tablets (1,000 mg total) by mouth 2 (two) times daily. 10/17/18   Isaiah Serge, NP  metoprolol succinate  (TOPROL-XL) 100 MG 24 hr tablet Take 1 tablet (100 mg total) by mouth every morning. Take with or immediately following a meal. In the Morning 05/07/19   Hilty, Nadean Corwin, MD  metoprolol succinate (TOPROL-XL) 50 MG 24 hr tablet Take 1 tablet (50 mg total) by mouth at bedtime. Take with or immediately following a meal. <PLEASE MAKE APPOINTMENT FOR REFILLS> 05/07/19   Hilty, Nadean Corwin, MD  oseltamivir (TAMIFLU) 75 MG capsule Take 1 capsule (75 mg total) by mouth every 12 (twelve) hours. 02/06/19   Nat Christen, MD  oxyCODONE (OXY IR/ROXICODONE) 5 MG immediate release tablet Take 1 tablet (5 mg total) by mouth every 6 (six) hours as needed for moderate pain. Patient not taking: Reported on 12/09/2018 11/24/18   Louellen Molder, MD  pantoprazole (Simla)  40 MG tablet Take 1 tablet (40 mg total) by mouth daily with supper. 06/04/19   Hilty, Nadean Corwin, MD  polyethylene glycol (MIRALAX / GLYCOLAX) packet Take 17 g by mouth daily as needed. Patient taking differently: Take 17 g by mouth daily as needed for mild constipation.  12/22/18   Eugenie Filler, MD  potassium chloride (K-DUR) 10 MEQ tablet Take 1 tablet (10 mEq total) by mouth daily. 09/02/18   Georgette Shell, MD  prochlorperazine (COMPAZINE) 5 MG tablet Take 1 tablet (5 mg total) by mouth 3 (three) times daily before meals. Take 1 tablet 3 times daily before meals x1 week and then every 6 hours as needed. Patient taking differently: Take 5 mg by mouth 3 (three) times daily before meals. Take 1 tablet 3 times daily before meals x1 week and then every 6 hours as needed for nausea 12/22/18   Eugenie Filler, MD  sucralfate (CARAFATE) 1 GM/10ML suspension Take 10 mLs (1 g total) by mouth 4 (four) times daily -  with meals and at bedtime for 10 days. 10/19/18 12/30/18  Long, Wonda Olds, MD  TRULICITY 9.98 PJ/8.2NK SOPN Inject 0.75 mg into the skin once a week. 12/21/18   [provider]  warfarin (COUMADIN) 5 MG tablet Take 1 tablet daily except  1.5 tablets on Mondays and Fridays, Or As Directed  by coumadin clinic 12/25/18   Pixie Casino, MD    Physical Exam: Vitals:   06/10/19 2030 06/10/19 2230 06/10/19 2245 06/10/19 2300  BP: 137/86 (!) 140/95 (!) 128/94 (!) 128/99  Pulse: (!) 105     Resp: (!) 25 (!) 23 (!) 21 (!) 23  Temp:      TempSrc:      SpO2: 100%         Constitutional: Cachectic, chronically ill looking Vitals:   06/10/19 2030 06/10/19 2230 06/10/19 2245 06/10/19 2300  BP: 137/86 (!) 140/95 (!) 128/94 (!) 128/99  Pulse: (!) 105     Resp: (!) 25 (!) 23 (!) 21 (!) 23  Temp:      TempSrc:      SpO2: 100%      Eyes: PERRL, lids and conjunctivae normal ENMT: Mucous membranes are moist. Posterior pharynx clear of any exudate or lesions.Normal dentition.  Neck: normal, supple, no masses, no thyromegaly Respiratory: clear to auscultation bilaterally, no wheezing, no crackles. Normal respiratory effort. No accessory muscle use.  Cardiovascular: Sinus tachycardia no murmurs / rubs / gallops. 2+ extremity edema. 2+ pedal pulses. No carotid bruits.  Abdomen: no tenderness, no masses palpated. No hepatosplenomegaly. Bowel sounds positive.  Musculoskeletal: no clubbing / cyanosis. No joint deformity upper and lower extremities. Good ROM, no contractures. Normal muscle tone.  Skin: no rashes, lesions, ulcers. No induration Neurologic: CN 2-12 grossly intact. Sensation intact, DTR normal. Strength 5/5 in all 4.  Psychiatric: Normal judgment and insight. Alert and oriented x 3. Normal mood.     Labs on Admission: I have personally reviewed following labs and imaging studies  CBC: Recent Labs  Lab 06/10/19 2009  WBC 6.3  NEUTROABS 4.2  HGB 14.6  HCT 45.3  MCV 68.4*  PLT 539   Basic Metabolic Panel: Recent Labs  Lab 06/10/19 2009 06/10/19 2049  NA 141  --   K 2.7*  --   CL 101  --   CO2 29  --   GLUCOSE 80  --   BUN 8  --   CREATININE 1.00  --  CALCIUM 8.1*  --   MG  --  1.3*   GFR: CrCl  cannot be calculated (Unknown ideal weight.). Liver Function Tests: No results for input(s): AST, ALT, ALKPHOS, BILITOT, PROT, ALBUMIN in the last 168 hours. No results for input(s): LIPASE, AMYLASE in the last 168 hours. No results for input(s): AMMONIA in the last 168 hours. Coagulation Profile: No results for input(s): INR, PROTIME in the last 168 hours. Cardiac Enzymes: No results for input(s): CKTOTAL, CKMB, CKMBINDEX, TROPONINI in the last 168 hours. BNP (last 3 results) No results for input(s): PROBNP in the last 8760 hours. HbA1C: No results for input(s): HGBA1C in the last 72 hours. CBG: Recent Labs  Lab 06/10/19 2004  GLUCAP 117*   Lipid Profile: No results for input(s): CHOL, HDL, LDLCALC, TRIG, CHOLHDL, LDLDIRECT in the last 72 hours. Thyroid Function Tests: No results for input(s): TSH, T4TOTAL, FREET4, T3FREE, THYROIDAB in the last 72 hours. Anemia Panel: No results for input(s): VITAMINB12, FOLATE, FERRITIN, TIBC, IRON, RETICCTPCT in the last 72 hours. Urine analysis:    Component Value Date/Time   COLORURINE YELLOW 12/30/2018 1910   APPEARANCEUR CLEAR 12/30/2018 1910   LABSPEC 1.011 12/30/2018 1910   PHURINE 6.0 12/30/2018 1910   GLUCOSEU >=500 (A) 12/30/2018 1910   HGBUR NEGATIVE 12/30/2018 1910   BILIRUBINUR NEGATIVE 12/30/2018 1910   KETONESUR NEGATIVE 12/30/2018 1910   PROTEINUR >=300 (A) 12/30/2018 1910   UROBILINOGEN 1.0 09/07/2014 1405   NITRITE NEGATIVE 12/30/2018 1910   LEUKOCYTESUR NEGATIVE 12/30/2018 1910   Sepsis Labs: @LABRCNTIP (procalcitonin:4,lacticidven:4) )No results found for this or any previous visit (from the past 240 hour(s)).   Radiological Exams on Admission: Dg Chest 2 View  Result Date: 06/10/2019 CLINICAL DATA:  Increasing shortness of breath. EXAM: CHEST - 2 VIEW COMPARISON:  February 28, 2019 FINDINGS: Small bilateral pleural effusions with underlying opacities are identified, more prominent the interval. Stable cardiomegaly. No  other changes. IMPRESSION: Increasing bilateral pleural effusions with underlying opacities. No other acute abnormalities. Electronically Signed   By: Dorise Bullion III M.D   On: 06/10/2019 10:52   Dg Chest Portable 1 View  Result Date: 06/10/2019 CLINICAL DATA:  Increased lower extremity edema EXAM: PORTABLE CHEST 1 VIEW COMPARISON:  06/10/2019, 02/28/2019, 02/06/2019 FINDINGS: Small moderate bilateral pleural effusions. Prominent cardiomegaly with vascular congestion and slight worsening of interstitial pulmonary edema. Persistent bibasilar consolidations. Right-sided effusion appears slightly increased. Aortic atherosclerosis. IMPRESSION: 1. Small moderate bilateral pleural effusions, slightly increased on the right side 2. Cardiomegaly with vascular congestion and slight worsening of interstitial pulmonary edema 3. Continued dense atelectasis or pneumonia at both bases Electronically Signed   By: Donavan Foil M.D.   On: 06/10/2019 21:31    EKG: Independently reviewed.  Shows sinus tachycardia with a rate of 103.  Diffuse T wave changes but nonspecific.  Assessment/Plan Principal Problem:   Acute CHF (congestive heart failure) (HCC) Active Problems:   Anxiety state   Chronic pain   Essential hypertension   Diabetes mellitus ([place])   Dilated cardiomyopathy (HCC)   GERD (gastroesophageal reflux disease)     #1 acute on chronic systolic dysfunction: Patient having exacerbation of her CHF.  She is on ARB with her Lasix.  She most likely has chronic CHF from her low EF but exacerbation now.  The oral Lasix is not working.  We will admit her for IV Lasix.  Diurese patient aggressively then discharged home on oral agents.  Continue other cardiac medications.  #2 hypokalemia: She is on potassium supplementation  at home.  This seems not to be working adequately.  Replete potassium aggressively and follow.  #3 essential hypertension: Continue with home regimen and adjust as necessary.  #4  diabetes: Blood sugars well controlled.  Continue treatment.  #5 GERD: Continue with PPIs  #6 anxiety with depression: Continue home regimen.  #7 chronic pain syndrome: We will continue with home regimen as well.  #8 dilated cardiomyopathy with chronic anticoagulation: Resume warfarin per pharmacy     DVT prophylaxis: Warfarin Code Status: Full code Family Communication: Discussed fully with the patient Disposition Plan: Home Consults called: None Admission status: Inpatient  Severity of Illness: The appropriate patient status for this patient is INPATIENT. Inpatient status is judged to be reasonable and necessary in order to provide the required intensity of service to ensure the patient's safety. The patient's presenting symptoms, physical exam findings, and initial radiographic and laboratory data in the context of their chronic comorbidities is felt to place them at high risk for further clinical deterioration. Furthermore, it is not anticipated that the patient will be medically stable for discharge from the hospital within 2 midnights of admission. The following factors support the patient status of inpatient.   " The patient's presenting symptoms include shortness of breath and leg swelling. " The worrisome physical exam findings include 2+ pedal edema. " The initial radiographic and laboratory data are worrisome because of chest x-ray showing CHF with markedly elevated proBNP. " The chronic co-morbidities include advanced systolic dysfunction CHF.   * I certify that at the point of admission it is my clinical judgment that the patient will require inpatient hospital care spanning beyond 2 midnights from the point of admission due to high intensity of service, high risk for further deterioration and high frequency of surveillance required.Barbette Merino MD [facility] Pager [phone]  If 7PM-7AM, please contact night-coverage [email or web address] Password [facility]   06/10/2019, 11:28 PM

## 2019-06-11 ENCOUNTER — Encounter (HOSPITAL_COMMUNITY): Payer: Self-pay

## 2019-06-11 DIAGNOSIS — I5021 Acute systolic (congestive) heart failure: Secondary | ICD-10-CM | POA: Diagnosis not present

## 2019-06-11 DIAGNOSIS — E119 Type 2 diabetes mellitus without complications: Secondary | ICD-10-CM | POA: Diagnosis not present

## 2019-06-11 DIAGNOSIS — G8929 Other chronic pain: Secondary | ICD-10-CM | POA: Diagnosis not present

## 2019-06-11 DIAGNOSIS — Z794 Long term (current) use of insulin: Secondary | ICD-10-CM

## 2019-06-11 DIAGNOSIS — I110 Hypertensive heart disease with heart failure: Secondary | ICD-10-CM | POA: Diagnosis not present

## 2019-06-11 DIAGNOSIS — F411 Generalized anxiety disorder: Secondary | ICD-10-CM | POA: Diagnosis not present

## 2019-06-11 LAB — GLUCOSE, CAPILLARY
Glucose-Capillary: 121 mg/dL — ABNORMAL HIGH (ref 70–99)
Glucose-Capillary: 135 mg/dL — ABNORMAL HIGH (ref 70–99)
Glucose-Capillary: 143 mg/dL — ABNORMAL HIGH (ref 70–99)
Glucose-Capillary: 53 mg/dL — ABNORMAL LOW (ref 70–99)
Glucose-Capillary: 53 mg/dL — ABNORMAL LOW (ref 70–99)
Glucose-Capillary: 73 mg/dL (ref 70–99)
Glucose-Capillary: 74 mg/dL (ref 70–99)

## 2019-06-11 LAB — CBC WITH DIFFERENTIAL/PLATELET
Abs Immature Granulocytes: 0.05 10*3/uL (ref 0.00–0.07)
Basophils Absolute: 0.1 10*3/uL (ref 0.0–0.1)
Basophils Relative: 1 %
Eosinophils Absolute: 0.1 10*3/uL (ref 0.0–0.5)
Eosinophils Relative: 2 %
HCT: 42.8 % (ref 36.0–46.0)
Hemoglobin: 13.9 g/dL (ref 12.0–15.0)
Immature Granulocytes: 1 %
Lymphocytes Relative: 16 %
Lymphs Abs: 1 10*3/uL (ref 0.7–4.0)
MCH: 21.8 pg — ABNORMAL LOW (ref 26.0–34.0)
MCHC: 32.5 g/dL (ref 30.0–36.0)
MCV: 67.1 fL — ABNORMAL LOW (ref 80.0–100.0)
Monocytes Absolute: 0.8 10*3/uL (ref 0.1–1.0)
Monocytes Relative: 12 %
Neutro Abs: 4.4 10*3/uL (ref 1.7–7.7)
Neutrophils Relative %: 68 %
Platelets: 223 10*3/uL (ref 150–400)
RBC: 6.38 MIL/uL — ABNORMAL HIGH (ref 3.87–5.11)
RDW: 17.8 % — ABNORMAL HIGH (ref 11.5–15.5)
WBC: 6.4 10*3/uL (ref 4.0–10.5)
nRBC: 0 % (ref 0.0–0.2)

## 2019-06-11 LAB — BASIC METABOLIC PANEL
Anion gap: 11 (ref 5–15)
Anion gap: 10 (ref 5–15)
BUN: 10 mg/dL (ref 8–23)
BUN: 10 mg/dL (ref 8–23)
CO2: 29 mmol/L (ref 22–32)
CO2: 29 mmol/L (ref 22–32)
Calcium: 8 mg/dL — ABNORMAL LOW (ref 8.9–10.3)
Calcium: 8 mg/dL — ABNORMAL LOW (ref 8.9–10.3)
Chloride: 102 mmol/L (ref 98–111)
Chloride: 103 mmol/L (ref 98–111)
Creatinine, Ser: 0.97 mg/dL (ref 0.44–1.00)
Creatinine, Ser: 0.92 mg/dL (ref 0.44–1.00)
GFR calc Af Amer: >60 mL/min (ref >60)
GFR calc Af Amer: >60 mL/min (ref >60)
GFR calc non Af Amer: >60 mL/min (ref >60)
GFR calc non Af Amer: >60 mL/min (ref >60)
Glucose, Bld: 85 mg/dL (ref 70–99)
Glucose, Bld: 61 mg/dL — ABNORMAL LOW (ref 70–99)
Potassium: 3 mmol/L — ABNORMAL LOW (ref 3.5–5.1)
Potassium: 3.3 mmol/L — ABNORMAL LOW (ref 3.5–5.1)
Sodium: 142 mmol/L (ref 135–145)
Sodium: 142 mmol/L (ref 135–145)

## 2019-06-11 LAB — MAGNESIUM: Magnesium: 2.2 mg/dL (ref 1.7–2.4)

## 2019-06-11 LAB — PROTIME-INR
INR: 2.5 — ABNORMAL HIGH (ref 0.8–1.2)
Prothrombin Time: 26.9 seconds — ABNORMAL HIGH (ref 11.4–15.2)

## 2019-06-11 LAB — SARS CORONAVIRUS 2 BY RT PCR (HOSPITAL ORDER, PERFORMED IN ~~LOC~~ HOSPITAL LAB): SARS Coronavirus 2: NEGATIVE

## 2019-06-11 MED ORDER — POLYVINYL ALCOHOL 1.4 % OP SOLN: 1.0000 [drp] | OPHTHALMIC | Status: DC | PRN

## 2019-06-11 MED ORDER — HYDRALAZINE HCL 20 MG/ML IJ SOLN: 10.0000 mg | INTRAMUSCULAR | Status: DC | PRN

## 2019-06-11 MED ORDER — MUSCLE RUB 10-15 % EX CREA: 1.0000 "application " | TOPICAL_CREAM | CUTANEOUS | Status: DC | PRN

## 2019-06-11 MED ORDER — HYDROCORTISONE 1 % EX CREA: 1.0000 "application " | TOPICAL_CREAM | Freq: Three times a day (TID) | CUTANEOUS | Status: DC | PRN

## 2019-06-11 MED ORDER — HYDROCORTISONE (PERIANAL) 2.5 % EX CREA: 1.0000 "application " | TOPICAL_CREAM | Freq: Four times a day (QID) | CUTANEOUS | Status: DC | PRN

## 2019-06-11 MED ORDER — WARFARIN SODIUM 5 MG PO TABS
5.0000 mg | ORAL_TABLET | Freq: Once | ORAL | Status: AC
  Administered 2019-06-11: 5 mg via ORAL
  Filled 2019-06-11: qty 1

## 2019-06-11 MED ORDER — SENNOSIDES-DOCUSATE SODIUM 8.6-50 MG PO TABS: 2.0000 | ORAL_TABLET | Freq: Every evening | ORAL | Status: DC | PRN

## 2019-06-11 MED ORDER — LORATADINE 10 MG PO TABS: 10.0000 mg | ORAL_TABLET | Freq: Every day | ORAL | Status: DC | PRN

## 2019-06-11 MED ORDER — MAGNESIUM SULFATE 4 GM/100ML IV SOLN
4.0000 g | Freq: Once | INTRAVENOUS | Status: AC
  Administered 2019-06-11: 4 g via INTRAVENOUS
  Filled 2019-06-11: qty 100

## 2019-06-11 MED ORDER — POTASSIUM CHLORIDE CRYS ER 20 MEQ PO TBCR
40.0000 meq | EXTENDED_RELEASE_TABLET | Freq: Three times a day (TID) | ORAL | Status: DC
  Filled 2019-06-11: qty 2

## 2019-06-11 MED ORDER — LIP MEDEX EX OINT: 1.0000 "application " | TOPICAL_OINTMENT | CUTANEOUS | Status: DC | PRN

## 2019-06-11 MED ORDER — FUROSEMIDE 10 MG/ML IJ SOLN
40.0000 mg | Freq: Three times a day (TID) | INTRAMUSCULAR | Status: AC
  Administered 2019-06-11 (×2): 40 mg via INTRAVENOUS
  Filled 2019-06-11 (×4): qty 4

## 2019-06-11 MED ORDER — SALINE SPRAY 0.65 % NA SOLN: 1.0000 | NASAL | Status: DC | PRN

## 2019-06-11 MED ORDER — ALUM & MAG HYDROXIDE-SIMETH 200-200-20 MG/5ML PO SUSP: 30.0000 mL | ORAL | Status: DC | PRN

## 2019-06-11 MED ORDER — WARFARIN - PHARMACIST DOSING INPATIENT: Freq: Every day | Status: DC

## 2019-06-11 MED ORDER — POTASSIUM CHLORIDE CRYS ER 20 MEQ PO TBCR
40.0000 meq | EXTENDED_RELEASE_TABLET | Freq: Three times a day (TID) | ORAL | Status: AC
  Administered 2019-06-11 (×3): 40 meq via ORAL
  Filled 2019-06-11 (×2): qty 2

## 2019-06-11 MED ORDER — PHENOL 1.4 % MT LIQD: 1.0000 | OROMUCOSAL | Status: DC | PRN

## 2019-06-11 NOTE — Progress Notes (Signed)
Inpatient Diabetes Program Recommendations  AACE/ADA: New Consensus Statement on Inpatient Glycemic Control (2015)  Target Ranges:  Prepandial:   less than 140 mg/dL      Peak postprandial:   less than 180 mg/dL (1-2 hours)      Critically ill patients:  140 - 180 mg/dL   Lab Results  Component Value Date   GLUCAP 135 (H) 06/11/2019   HGBA1C 8.6 (H) 12/17/2018    Review of Glycemic Control Results for Kristen Lozano, Kristen Lozano (MRN [id number]) as of 06/11/2019 13:27  Ref. Range 06/10/2019 23:36 06/11/2019 00:03 06/11/2019 00:30 06/11/2019 06:04 06/11/2019 11:11  Glucose-Capillary Latest Ref Range: 70 - 99 mg/dL 53 (L) 53 (L) 73 74 135 (H)   Diabetes history: Type 2 DM Outpatient Diabetes medications: Lantus 10 units QHS, Metformin 8811 mg BID, Trulicity 0.31 mg Q/wk Current orders for Inpatient glycemic control: Lantus 10 units QHS, Novolog 0-9 units TID, Novolog 0-5 units QHS  Inpatient Diabetes Program Recommendations:    Noted hypoglycemia on 7/16 of 53 mg/dL. Patient did not received QHS dose of Lantus.Given current trends, consider discontinuing Lantus QHS dose and further evaluate insulin requirements while inpatient.   Thanks, Bronson Curb, MSN, RNC-OB Diabetes Coordinator [phone] (8a-5p)

## 2019-06-11 NOTE — Progress Notes (Signed)
ANTICOAGULATION CONSULT NOTE - Initial Consult  Pharmacy Consult for Warfarin  Indication: stroke, mural thrombus  Allergies  Allergen Reactions  . Metformin And Related Nausea And Vomiting  . Lipitor [Atorvastatin]     Patient Measurements: Height: 5\' 3"  (160 cm) Weight: 126 lb 12.8 oz (57.5 kg)(scale a) IBW/kg (Calculated) : 52.4  Vital Signs: Temp: 97.8 F (36.6 C) (07/17 1153) Temp Source: Oral (07/17 1153) BP: 114/84 (07/17 1153) Pulse Rate: 103 (07/17 1153)  Labs: Recent Labs    06/10/19 2009 06/11/19 0150 06/11/19 0610  HGB 14.6 13.9  --   HCT 45.3 42.8  --   PLT 234 223  --   LABPROT  --  26.9*  --   INR  --  2.5*  --   CREATININE 1.00 0.97 0.92    Estimated Creatinine Clearance: 51.1 mL/min (by C-G formula based on SCr of 0.92 mg/dL).   Medical History: Past Medical History:  Diagnosis Date  . Acute combined systolic and diastolic HF (heart failure) ([place]) 10/16/2018  . Allergic rhinitis    Requires cetirizine, singulair, and fluticasone.  . Anxiety    Has been on Xanax since 2009. Uses it for stress, anxiety, and insomnia. No contract yet.  . Arthritis   . CHF (congestive heart failure) (HCC)    EF 35% after stroke, presumed ischemic  . Chronic pain    Has OA of knees B. No Xrays in echart. Not requiring narcotics.  . Colitis 12/2017  . Depression   . Diabetes mellitus    Type 2, non insulin dependent. Was dx'd prior to 2008.  Marland Kitchen Hyperglycemia   . Hyperlipemia   . Hypertension   . Sickle cell trait ([place])   . 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 Admit INR 2.5 on PTA dose of 5 mg daily EXCEPT for 7.5 mg on Mondays only. Pharmacy consulted to resume dosing this admission.   INR today is therapeutic. Will continue with PTA dose today. No bleeding noted  Goal of Therapy:  INR 2-3 Monitor platelets by anticoagulation  protocol: Yes   Plan:  - Warfarin 5 mg x 1 dose at 1800 today - Will continue to monitor for any signs/symptoms of bleeding and will follow up with PT/INR in the a.m.   Thank you for allowing pharmacy to be a part of this patient's care.  Alycia Rossetti, PharmD, BCPS Clinical Pharmacist Clinical phone for 06/11/2019: [phone] 06/11/2019 2:34 PM   **Pharmacist phone directory can now be found on [email or web address] ([email or web address]).  Listed under [place].

## 2019-06-11 NOTE — Progress Notes (Signed)
Return call to [facility]) [phone], left message.

## 2019-06-11 NOTE — Progress Notes (Signed)
PROGRESS NOTE    Kristen Lozano  WUJ:[id number] DOB: May 07, 1956 DOA: 06/10/2019 PCP: Audley Hose, MD   Brief Narrative:  63 year old female with systolic CHF ejection fraction 20%, essential hypertension, CVA, diabetes mellitus type 2, depression, chronic pain came to the hospital complains of progressive shortness of breath and bilateral lower extremity swelling.  She was diagnosed with acute CHF exacerbation and started on IV Lasix.   Assessment & Plan:   Principal Problem:   Acute CHF (congestive heart failure) (HCC) Active Problems:   Anxiety state   Chronic pain   Essential hypertension   Diabetes mellitus (HCC)   Dilated cardiomyopathy (HCC)   GERD (gastroesophageal reflux disease)  Acute on chronic systolic congestive heart failure, ejection fraction 20%, class III - Fluid restriction, increase Lasix 40 mg to 3 times daily IV - Monitor blood pressure, monitor electrolytes - Continue home cardiac regimen. -Supportive care. - Continue Toprol-XL, losartan  Hypokalemia/hypomagnesemia -Repletion.  Essential hypertension -Resume home meds.  Getting aggressive diuresis.  Diabetes mellitus type 2 -Insulin sliding scale and Accu-Chek.  Lantus 10 units daily  GERD -PPI  Chronic pain -Continue home meds  Anxiety/depression -Continue home meds.  Chronic anticoagulation for history of left ventricular mural thrombus -On Coumadin per pharmacy.    DVT prophylaxis: Coumadin Code Status: Full code Family Communication: None at bedside Disposition Plan: Maintain hospital stay for 2 weeks next 24 hours for IV diuresis  Consultants:   None  Procedures:   None  Antimicrobials:   None   Subjective: Patient still reports of some exertional shortness of breath and significant amount of bilateral lower extremity edema causing her some difficulty walking.  Review of Systems Otherwise negative except as per HPI, including: General: Denies fever, chills,  night sweats or unintended weight loss. Resp: Denies cough, wheezing,  Cardiac: Denies chest pain, palpitations, orthopnea, paroxysmal nocturnal dyspnea. GI: Denies abdominal pain, nausea, vomiting, diarrhea or constipation GU: Denies dysuria, frequency, hesitancy or incontinence MS: Denies muscle aches, joint pain or swelling Neuro: Denies headache, neurologic deficits (focal weakness, numbness, tingling), abnormal gait Psych: Denies anxiety, depression, SI/HI/AVH Skin: Denies new rashes or lesions ID: Denies sick contacts, exotic exposures, travel  Objective: Vitals:   06/10/19 2327 06/10/19 2332 06/11/19 0303 06/11/19 0819  BP: (!) 151/98 (!) 151/98 (!) 131/94 (!) 147/82  Pulse: (!) 110 (!) 110 68 76  Resp: 18 18 18 17   Temp: 98.3 F (36.8 C) 98.2 F (36.8 C) 98 F (36.7 C) (!) 97.3 F (36.3 C)  TempSrc: Oral Oral Oral Oral  SpO2: 98% 98% 96% 98%  Weight:  57.6 kg 57.5 kg   Height:  5' 3"  (1.6 m)      Intake/Output Summary (Last 24 hours) at 06/11/2019 1125 Last data filed at 06/11/2019 1004 Gross per 24 hour  Intake 924.81 ml  Output 1950 ml  Net -1025.19 ml   Filed Weights   06/10/19 2332 06/11/19 0303  Weight: 57.6 kg 57.5 kg    Examination:  General exam: Appears calm and comfortable  Respiratory system: Bibasilar crackles Cardiovascular system: S1 & S2 heard, RRR. No JVD, murmurs, rubs, gallops or clicks.  3+ bilateral lower extremity pitting edema Gastrointestinal system: Abdomen is nondistended, soft and nontender. No organomegaly or masses felt. Normal bowel sounds heard. Central nervous system: Alert and oriented. No focal neurological deficits. Extremities: Symmetric 5 x 5 power. Skin: No rashes, lesions or ulcers Psychiatry: Judgement and insight appear normal. Mood & affect appropriate.     Data Reviewed:  CBC: Recent Labs  Lab 06/10/19 2009 06/11/19 0150  WBC 6.3 6.4  NEUTROABS 4.2 4.4  HGB 14.6 13.9  HCT 45.3 42.8  MCV 68.4* 67.1*  PLT  234 233   Basic Metabolic Panel: Recent Labs  Lab 06/10/19 2009 06/10/19 2049 06/11/19 0150 06/11/19 0610  NA 141  --  142 142  K 2.7*  --  3.0* 3.3*  CL 101  --  102 103  CO2 29  --  29 29  GLUCOSE 80  --  85 61*  BUN 8  --  10 10  CREATININE 1.00  --  0.97 0.92  CALCIUM 8.1*  --  8.0* 8.0*  MG  --  1.3*  --  2.2   GFR: Estimated Creatinine Clearance: 51.1 mL/min (by C-G formula based on SCr of 0.92 mg/dL). Liver Function Tests: No results for input(s): AST, ALT, ALKPHOS, BILITOT, PROT, ALBUMIN in the last 168 hours. No results for input(s): LIPASE, AMYLASE in the last 168 hours. No results for input(s): AMMONIA in the last 168 hours. Coagulation Profile: Recent Labs  Lab 06/11/19 0150  INR 2.5*   Cardiac Enzymes: No results for input(s): CKTOTAL, CKMB, CKMBINDEX, TROPONINI in the last 168 hours. BNP (last 3 results) No results for input(s): PROBNP in the last 8760 hours. HbA1C: No results for input(s): HGBA1C in the last 72 hours. CBG: Recent Labs  Lab 06/10/19 2336 06/11/19 0003 06/11/19 0030 06/11/19 0604 06/11/19 1111  GLUCAP 53* 53* 73 74 135*   Lipid Profile: No results for input(s): CHOL, HDL, LDLCALC, TRIG, CHOLHDL, LDLDIRECT in the last 72 hours. Thyroid Function Tests: No results for input(s): TSH, T4TOTAL, FREET4, T3FREE, THYROIDAB in the last 72 hours. Anemia Panel: No results for input(s): VITAMINB12, FOLATE, FERRITIN, TIBC, IRON, RETICCTPCT in the last 72 hours. Sepsis Labs: No results for input(s): PROCALCITON, LATICACIDVEN in the last 168 hours.  Recent Results (from the past 240 hour(s))  SARS Coronavirus 2 (CEPHEID - Performed in [place] hospital lab), Hosp Order     Status: None   Collection Time: 06/10/19 10:53 PM   Specimen: Nasopharyngeal Swab  Result Value Ref Range Status   SARS Coronavirus 2 NEGATIVE NEGATIVE Final    Comment: (NOTE) If result is NEGATIVE SARS-CoV-2 target nucleic acids are NOT DETECTED. The SARS-CoV-2 RNA  is generally detectable in upper and lower  respiratory specimens during the acute phase of infection. The lowest  concentration of SARS-CoV-2 viral copies this assay can detect is 250  copies / mL. A negative result does not preclude SARS-CoV-2 infection  and should not be used as the sole basis for treatment or other  patient management decisions.  A negative result may occur with  improper specimen collection / handling, submission of specimen other  than nasopharyngeal swab, presence of viral mutation(s) within the  areas targeted by this assay, and inadequate number of viral copies  (<250 copies / mL). A negative result must be combined with clinical  observations, patient history, and epidemiological information. If result is POSITIVE SARS-CoV-2 target nucleic acids are DETECTED. The SARS-CoV-2 RNA is generally detectable in upper and lower  respiratory specimens dur ing the acute phase of infection.  Positive  results are indicative of active infection with SARS-CoV-2.  Clinical  correlation with patient history and other diagnostic information is  necessary to determine patient infection status.  Positive results do  not rule out bacterial infection or co-infection with other viruses. If result is PRESUMPTIVE POSTIVE SARS-CoV-2 nucleic acids MAY BE  PRESENT.   A presumptive positive result was obtained on the submitted specimen  and confirmed on repeat testing.  While 2019 novel coronavirus  (SARS-CoV-2) nucleic acids may be present in the submitted sample  additional confirmatory testing may be necessary for epidemiological  and / or clinical management purposes  to differentiate between  SARS-CoV-2 and other Sarbecovirus currently known to infect humans.  If clinically indicated additional testing with an alternate test  methodology [phone]) is advised. The SARS-CoV-2 RNA is generally  detectable in upper and lower respiratory sp ecimens during the acute  phase of infection.  The expected result is Negative. Fact Sheet for Patients:  StrictlyIdeas.no Fact Sheet for Healthcare Providers: BankingDealers.co.za This test is not yet approved or cleared by the [place] FDA and has been authorized for detection and/or diagnosis of SARS-CoV-2 by FDA under an Emergency Use Authorization (EUA).  This EUA will remain in effect (meaning this test can be used) for the duration of the COVID-19 declaration under Section 564(b)(1) of the Act, 21 U.S.C. section 360bbb-3(b)(1), unless the authorization is terminated or revoked sooner. Performed at [facility], [place] [address], [place], [place] [address]          Radiology Studies: Dg Chest 2 View  Result Date: 06/10/2019 CLINICAL DATA:  Increasing shortness of breath. EXAM: CHEST - 2 VIEW COMPARISON:  February 28, 2019 FINDINGS: Small bilateral pleural effusions with underlying opacities are identified, more prominent the interval. Stable cardiomegaly. No other changes. IMPRESSION: Increasing bilateral pleural effusions with underlying opacities. No other acute abnormalities. Electronically Signed   By: Dorise Bullion III M.D   On: 06/10/2019 10:52   Dg Chest Portable 1 View  Result Date: 06/10/2019 CLINICAL DATA:  Increased lower extremity edema EXAM: PORTABLE CHEST 1 VIEW COMPARISON:  06/10/2019, 02/28/2019, 02/06/2019 FINDINGS: Small moderate bilateral pleural effusions. Prominent cardiomegaly with vascular congestion and slight worsening of interstitial pulmonary edema. Persistent bibasilar consolidations. Right-sided effusion appears slightly increased. Aortic atherosclerosis. IMPRESSION: 1. Small moderate bilateral pleural effusions, slightly increased on the right side 2. Cardiomegaly with vascular congestion and slight worsening of interstitial pulmonary edema 3. Continued dense atelectasis or pneumonia at both bases Electronically Signed   By: Donavan Foil M.D.    On: 06/10/2019 21:31        Scheduled Meds: . aspirin EC  81 mg Oral Daily  . feeding supplement (ENSURE ENLIVE)  237 mL Oral Daily  . furosemide  40 mg Intravenous Q8H  . insulin aspart  0-5 Units Subcutaneous QHS  . insulin aspart  0-9 Units Subcutaneous TID WC  . insulin glargine  10 Units Subcutaneous QHS  . ivabradine  5 mg Oral BID WC  . losartan  12.5 mg Oral Daily  . metoprolol succinate  100 mg Oral Daily  . metoprolol succinate  50 mg Oral QHS  . pantoprazole  40 mg Oral Q supper  . potassium chloride  10 mEq Oral Daily  . potassium chloride  40 mEq Oral TID WC  . sodium chloride flush  3 mL Intravenous Q12H  . sucralfate  1 g Oral TID WC & HS  . Warfarin - Pharmacist Dosing Inpatient   Does not apply q1800   Continuous Infusions: . sodium chloride 250 mL (06/10/19 2358)     LOS: 1 day   Time spent= 35 mins     Arsenio Loader, MD [facility]  If 7PM-7AM, please contact night-coverage [email or web address] 06/11/2019, 11:25 AM

## 2019-06-11 NOTE — Progress Notes (Signed)
Patient admitting CBG 53.  Gave patient juice and something to eat per request.  Patient CBG came up to 73.

## 2019-06-11 NOTE — Progress Notes (Signed)
Spoke with patient's husband, Al who is patient's primary contact once patient arrived to the floor.  Provided husband with an update and room/unit phone numbers.

## 2019-06-11 NOTE — Progress Notes (Signed)
Patient's husband stated that patient has a n/v reaction to lipitor and metformin and asked that we do not give these to patient.  RN added items to allergy list.  Lipitor is scheduled in the AM.  RN notified [facility].

## 2019-06-11 NOTE — Progress Notes (Signed)
ANTICOAGULATION CONSULT NOTE - Initial Consult  Pharmacy Consult for Warfarin  Indication: stroke, mural thrombus  No Known Allergies  Patient Measurements: Height: 5\' 3"  (160 cm) Weight: 127 lb (57.6 kg)(scale a) IBW/kg (Calculated) : 52.4  Vital Signs: Temp: 98.2 F (36.8 C) (07/16 2332) Temp Source: Oral (07/16 2332) BP: 151/98 (07/16 2332) Pulse Rate: 110 (07/16 2332)  Labs: Recent Labs    06/10/19 2009  HGB 14.6  HCT 45.3  PLT 234  CREATININE 1.00    Estimated Creatinine Clearance: 47 mL/min (by C-G formula based on SCr of 1 mg/dL).   Medical History: Past Medical History:  Diagnosis Date  . Acute combined systolic and diastolic HF (heart failure) ([place]) 10/16/2018  . Allergic rhinitis    Requires cetirizine, singulair, and fluticasone.  . Anxiety    Has been on Xanax since 2009. Uses it for stress, anxiety, and insomnia. No contract yet.  . Arthritis   . CHF (congestive heart failure) (HCC)    EF 35% after stroke, presumed ischemic  . Chronic pain    Has OA of knees B. No Xrays in echart. Not requiring narcotics.  . Colitis 12/2017  . Depression   . Diabetes mellitus    Type 2, non insulin dependent. Was dx'd prior to 2008.  Marland Kitchen Hyperglycemia   . Hyperlipemia   . Hypertension   . Sickle cell trait ([place])   . 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

## 2019-06-12 DIAGNOSIS — I5021 Acute systolic (congestive) heart failure: Secondary | ICD-10-CM | POA: Diagnosis not present

## 2019-06-12 DIAGNOSIS — I110 Hypertensive heart disease with heart failure: Secondary | ICD-10-CM | POA: Diagnosis not present

## 2019-06-12 LAB — PROTIME-INR
INR: 3.4 — ABNORMAL HIGH (ref 0.8–1.2)
Prothrombin Time: 34.2 seconds — ABNORMAL HIGH (ref 11.4–15.2)

## 2019-06-12 LAB — MAGNESIUM: Magnesium: 2.1 mg/dL (ref 1.7–2.4)

## 2019-06-12 LAB — COMPREHENSIVE METABOLIC PANEL
ALT: 33 U/L (ref 0–44)
AST: 77 U/L — ABNORMAL HIGH (ref 15–41)
Albumin: 2.4 g/dL — ABNORMAL LOW (ref 3.5–5.0)
Alkaline Phosphatase: 113 U/L (ref 38–126)
Anion gap: 12 (ref 5–15)
BUN: 18 mg/dL (ref 8–23)
CO2: 26 mmol/L (ref 22–32)
Calcium: 8.5 mg/dL — ABNORMAL LOW (ref 8.9–10.3)
Chloride: 100 mmol/L (ref 98–111)
Creatinine, Ser: 1.17 mg/dL — ABNORMAL HIGH (ref 0.44–1.00)
GFR calc Af Amer: 57 mL/min — ABNORMAL LOW (ref >60)
GFR calc non Af Amer: 49 mL/min — ABNORMAL LOW (ref >60)
Glucose, Bld: 133 mg/dL — ABNORMAL HIGH (ref 70–99)
Potassium: 4.3 mmol/L (ref 3.5–5.1)
Sodium: 138 mmol/L (ref 135–145)
Total Bilirubin: 0.9 mg/dL (ref 0.3–1.2)
Total Protein: 6.3 g/dL — ABNORMAL LOW (ref 6.5–8.1)

## 2019-06-12 LAB — CBC
HCT: 48.8 % — ABNORMAL HIGH (ref 36.0–46.0)
Hemoglobin: 15.9 g/dL — ABNORMAL HIGH (ref 12.0–15.0)
MCH: 22.1 pg — ABNORMAL LOW (ref 26.0–34.0)
MCHC: 32.6 g/dL (ref 30.0–36.0)
MCV: 67.8 fL — ABNORMAL LOW (ref 80.0–100.0)
Platelets: 235 10*3/uL (ref 150–400)
RBC: 7.2 MIL/uL — ABNORMAL HIGH (ref 3.87–5.11)
RDW: 18.6 % — ABNORMAL HIGH (ref 11.5–15.5)
WBC: 5.8 10*3/uL (ref 4.0–10.5)
nRBC: 0.5 % — ABNORMAL HIGH (ref 0.0–0.2)

## 2019-06-12 LAB — GLUCOSE, CAPILLARY
Glucose-Capillary: 126 mg/dL — ABNORMAL HIGH (ref 70–99)
Glucose-Capillary: 162 mg/dL — ABNORMAL HIGH (ref 70–99)
Glucose-Capillary: 180 mg/dL — ABNORMAL HIGH (ref 70–99)
Glucose-Capillary: 106 mg/dL — ABNORMAL HIGH (ref 70–99)

## 2019-06-12 NOTE — Progress Notes (Signed)
ANTICOAGULATION CONSULT NOTE - Follow-up Consult  Pharmacy Consult for Warfarin  Indication: stroke, mural thrombus  Allergies  Allergen Reactions  . Metformin And Related Nausea And Vomiting  . Lipitor [Atorvastatin]     Patient Measurements: Height: 5\' 3"  (160 cm) Weight: 121 lb 1.6 oz (54.9 kg) IBW/kg (Calculated) : 52.4  Vital Signs: Temp: 97.8 F (36.6 C) (07/18 0507) Temp Source: Oral (07/18 0507) BP: 102/75 (07/18 0507) Pulse Rate: 88 (07/18 0507)  Labs: Recent Labs    06/10/19 2009 06/11/19 0150 06/11/19 0610 06/12/19 0345  HGB 14.6 13.9  --  15.9*  HCT 45.3 42.8  --  48.8*  PLT 234 223  --  235  LABPROT  --  26.9*  --  34.2*  INR  --  2.5*  --  3.4*  CREATININE 1.00 0.97 0.92 1.17*    Estimated Creatinine Clearance: 40.2 mL/min (A) (by C-G formula based on SCr of 1.17 mg/dL (H)).   Medical History: Past Medical History:  Diagnosis Date  . Acute combined systolic and diastolic HF (heart failure) ([place]) 10/16/2018  . Allergic rhinitis    Requires cetirizine, singulair, and fluticasone.  . Anxiety    Has been on Xanax since 2009. Uses it for stress, anxiety, and insomnia. No contract yet.  . Arthritis   . CHF (congestive heart failure) (HCC)    EF 35% after stroke, presumed ischemic  . Chronic pain    Has OA of knees B. No Xrays in echart. Not requiring narcotics.  . Colitis 12/2017  . Depression   . Diabetes mellitus    Type 2, non insulin dependent. Was dx'd prior to 2008.  Marland Kitchen Hyperglycemia   . Hyperlipemia   . Hypertension   . Sickle cell trait ([place])   . 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 Admit INR 2.5 on PTA dose of 5 mg daily EXCEPT for 7.5 mg on Mondays only. Pharmacy consulted to resume dosing this admission.   INR 3.4 - supratherapeutic. No bleeding noted  Goal of Therapy:  INR 2-3 Monitor platelets by  anticoagulation protocol: Yes   Plan:  - No Warfarin dose tonight - Monitor for s/s of bleeding and follow up INR in the a.m.   Lorel Monaco, PharmD PGY1 Ambulatory Care Resident [employer] # [phone]

## 2019-06-12 NOTE — Progress Notes (Signed)
Patient had episode of emesis with food consistency.  Given IV Zofran.  VSS.  Patient currently resting.  [facility] notified.

## 2019-06-12 NOTE — Progress Notes (Signed)
PROGRESS NOTE    Kristen Lozano  XVQ:[id number] DOB: 30-May-1956 DOA: 06/10/2019 PCP: Audley Hose, MD   Brief Narrative:  62 year old female with systolic CHF ejection fraction 20%, essential hypertension, CVA, diabetes mellitus type 2, depression, chronic pain came to the hospital complains of progressive shortness of breath and bilateral lower extremity swelling.  Patient was diagnosed with acute CHF exacerbation and started on IV Lasix.  06/12/2019: Patient seen.  Patient is a very poor historian.  No significant history from patient.  Leg edema persists.  We will continue current management for now.   Assessment & Plan:   Principal Problem:   Acute CHF (congestive heart failure) (HCC) Active Problems:   Anxiety state   Chronic pain   Essential hypertension   Diabetes mellitus (HCC)   Dilated cardiomyopathy (HCC)   GERD (gastroesophageal reflux disease)  Acute on chronic systolic congestive heart failure, ejection fraction 20%, class III - Fluid restriction, increase Lasix 40 mg to 3 times daily IV - Monitor blood pressure, monitor electrolytes - Continue home cardiac regimen. -Supportive care. - Continue Toprol-XL, losartan 06/12/2019: Continue diuretics for now.  Hypokalemia/hypomagnesemia -Repleted.  Potassium this morning is 4.3.    Essential hypertension -Optimized.   -Continue current medication.    Diabetes mellitus type 2 - Optimized - Continue sliding scale insulin and Accu-Chek.    GERD -PPI  Chronic pain -Continue home meds  Anxiety/depression -Continue home meds.  Chronic anticoagulation for history of left ventricular mural thrombus -On Coumadin per pharmacy.    DVT prophylaxis: Coumadin Code Status: Full code Family Communication:   Disposition Plan: This will depend on hospital course.  Consultants:   None  Procedures:   None  Antimicrobials:   None   Subjective: Poor historian No significant history from patient Leg  edema persists.  Objective: Vitals:   06/12/19 0119 06/12/19 0200 06/12/19 0507 06/12/19 1111  BP:  106/83 102/75 109/84  Pulse:  88 88 84  Resp:   18 15  Temp:  (!) 97.5 F (36.4 C) 97.8 F (36.6 C)   TempSrc:  Oral Oral   SpO2:  100% 95% 99%  Weight: 54.9 kg     Height:        Intake/Output Summary (Last 24 hours) at 06/12/2019 1255 Last data filed at 06/12/2019 1122 Gross per 24 hour  Intake 540 ml  Output 202 ml  Net 338 ml   Filed Weights   06/11/19 0303 06/12/19 0100 06/12/19 0119  Weight: 57.5 kg 57.7 kg 54.9 kg    Examination:  General exam: Appears calm and comfortable. Thin lady  Respiratory system: Decreased air entry. Cardiovascular system: S1 & S2 Gastrointestinal system: Abdomen is nondistended, soft and nontender. No organomegaly or masses felt. Normal bowel sounds heard. Central nervous system: Alert and oriented.  Patient moves all extremities.   Extremities: Bilateral ankle edema.  Data Reviewed:   CBC: Recent Labs  Lab 06/10/19 2009 06/11/19 0150 06/12/19 0345  WBC 6.3 6.4 5.8  NEUTROABS 4.2 4.4  --   HGB 14.6 13.9 15.9*  HCT 45.3 42.8 48.8*  MCV 68.4* 67.1* 67.8*  PLT 234 223 093   Basic Metabolic Panel: Recent Labs  Lab 06/10/19 2009 06/10/19 2049 06/11/19 0150 06/11/19 0610 06/12/19 0345  NA 141  --  142 142 138  K 2.7*  --  3.0* 3.3* 4.3  CL 101  --  102 103 100  CO2 29  --  29 29 26   GLUCOSE 80  --  85  61* 133*  BUN 8  --  10 10 18   CREATININE 1.00  --  0.97 0.92 1.17*  CALCIUM 8.1*  --  8.0* 8.0* 8.5*  MG  --  1.3*  --  2.2 2.1   GFR: Estimated Creatinine Clearance: 40.2 mL/min (A) (by C-G formula based on SCr of 1.17 mg/dL (H)). Liver Function Tests: Recent Labs  Lab 06/12/19 0345  AST 77*  ALT 33  ALKPHOS 113  BILITOT 0.9  PROT 6.3*  ALBUMIN 2.4*   No results for input(s): LIPASE, AMYLASE in the last 168 hours. No results for input(s): AMMONIA in the last 168 hours. Coagulation Profile: Recent Labs  Lab  06/11/19 0150 06/12/19 0345  INR 2.5* 3.4*   Cardiac Enzymes: No results for input(s): CKTOTAL, CKMB, CKMBINDEX, TROPONINI in the last 168 hours. BNP (last 3 results) No results for input(s): PROBNP in the last 8760 hours. HbA1C: No results for input(s): HGBA1C in the last 72 hours. CBG: Recent Labs  Lab 06/11/19 1111 06/11/19 1621 06/11/19 2048 06/12/19 0611 06/12/19 1113  GLUCAP 135* 121* 143* 126* 162*   Lipid Profile: No results for input(s): CHOL, HDL, LDLCALC, TRIG, CHOLHDL, LDLDIRECT in the last 72 hours. Thyroid Function Tests: No results for input(s): TSH, T4TOTAL, FREET4, T3FREE, THYROIDAB in the last 72 hours. Anemia Panel: No results for input(s): VITAMINB12, FOLATE, FERRITIN, TIBC, IRON, RETICCTPCT in the last 72 hours. Sepsis Labs: No results for input(s): PROCALCITON, LATICACIDVEN in the last 168 hours.  Recent Results (from the past 240 hour(s))  SARS Coronavirus 2 (CEPHEID - Performed in [place] hospital lab), Hosp Order     Status: None   Collection Time: 06/10/19 10:53 PM   Specimen: Nasopharyngeal Swab  Result Value Ref Range Status   SARS Coronavirus 2 NEGATIVE NEGATIVE Final    Comment: (NOTE) If result is NEGATIVE SARS-CoV-2 target nucleic acids are NOT DETECTED. The SARS-CoV-2 RNA is generally detectable in upper and lower  respiratory specimens during the acute phase of infection. The lowest  concentration of SARS-CoV-2 viral copies this assay can detect is 250  copies / mL. A negative result does not preclude SARS-CoV-2 infection  and should not be used as the sole basis for treatment or other  patient management decisions.  A negative result may occur with  improper specimen collection / handling, submission of specimen other  than nasopharyngeal swab, presence of viral mutation(s) within the  areas targeted by this assay, and inadequate number of viral copies  (<250 copies / mL). A negative result must be combined with clinical   observations, patient history, and epidemiological information. If result is POSITIVE SARS-CoV-2 target nucleic acids are DETECTED. The SARS-CoV-2 RNA is generally detectable in upper and lower  respiratory specimens dur ing the acute phase of infection.  Positive  results are indicative of active infection with SARS-CoV-2.  Clinical  correlation with patient history and other diagnostic information is  necessary to determine patient infection status.  Positive results do  not rule out bacterial infection or co-infection with other viruses. If result is PRESUMPTIVE POSTIVE SARS-CoV-2 nucleic acids MAY BE PRESENT.   A presumptive positive result was obtained on the submitted specimen  and confirmed on repeat testing.  While 2019 novel coronavirus  (SARS-CoV-2) nucleic acids may be present in the submitted sample  additional confirmatory testing may be necessary for epidemiological  and / or clinical management purposes  to differentiate between  SARS-CoV-2 and other Sarbecovirus currently known to infect humans.  If clinically  indicated additional testing with an alternate test  methodology [phone]) is advised. The SARS-CoV-2 RNA is generally  detectable in upper and lower respiratory sp ecimens during the acute  phase of infection. The expected result is Negative. Fact Sheet for Patients:  StrictlyIdeas.no Fact Sheet for Healthcare Providers: BankingDealers.co.za This test is not yet approved or cleared by the [place] FDA and has been authorized for detection and/or diagnosis of SARS-CoV-2 by FDA under an Emergency Use Authorization (EUA).  This EUA will remain in effect (meaning this test can be used) for the duration of the COVID-19 declaration under Section 564(b)(1) of the Act, 21 U.S.C. section 360bbb-3(b)(1), unless the authorization is terminated or revoked sooner. Performed at [facility], [address],  [place], [place] [address]          Radiology Studies: Dg Chest Portable 1 View  Result Date: 06/10/2019 CLINICAL DATA:  Increased lower extremity edema EXAM: PORTABLE CHEST 1 VIEW COMPARISON:  06/10/2019, 02/28/2019, 02/06/2019 FINDINGS: Small moderate bilateral pleural effusions. Prominent cardiomegaly with vascular congestion and slight worsening of interstitial pulmonary edema. Persistent bibasilar consolidations. Right-sided effusion appears slightly increased. Aortic atherosclerosis. IMPRESSION: 1. Small moderate bilateral pleural effusions, slightly increased on the right side 2. Cardiomegaly with vascular congestion and slight worsening of interstitial pulmonary edema 3. Continued dense atelectasis or pneumonia at both bases Electronically Signed   By: Donavan Foil M.D.   On: 06/10/2019 21:31        Scheduled Meds: . aspirin EC  81 mg Oral Daily  . feeding supplement (ENSURE ENLIVE)  237 mL Oral Daily  . furosemide  40 mg Intravenous Q8H  . insulin aspart  0-5 Units Subcutaneous QHS  . insulin aspart  0-9 Units Subcutaneous TID WC  . ivabradine  5 mg Oral BID WC  . losartan  12.5 mg Oral Daily  . metoprolol succinate  100 mg Oral Daily  . metoprolol succinate  50 mg Oral QHS  . pantoprazole  40 mg Oral Q supper  . potassium chloride  10 mEq Oral Daily  . sodium chloride flush  3 mL Intravenous Q12H  . sucralfate  1 g Oral TID WC & HS  . Warfarin - Pharmacist Dosing Inpatient   Does not apply q1800   Continuous Infusions: . sodium chloride 250 mL (06/10/19 2358)     LOS: 2 days   Time spent= 35 mins    Bonnell Public, MD [facility]  If 7PM-7AM, please contact night-coverage [email or web address] 06/12/2019, 12:55 PM

## 2019-06-13 DIAGNOSIS — I110 Hypertensive heart disease with heart failure: Secondary | ICD-10-CM | POA: Diagnosis not present

## 2019-06-13 DIAGNOSIS — I5021 Acute systolic (congestive) heart failure: Secondary | ICD-10-CM | POA: Diagnosis not present

## 2019-06-13 LAB — CBC
HCT: 46 % (ref 36.0–46.0)
Hemoglobin: 15.2 g/dL — ABNORMAL HIGH (ref 12.0–15.0)
MCH: 21.9 pg — ABNORMAL LOW (ref 26.0–34.0)
MCHC: 33 g/dL (ref 30.0–36.0)
MCV: 66.4 fL — ABNORMAL LOW (ref 80.0–100.0)
Platelets: 228 10*3/uL (ref 150–400)
RBC: 6.93 MIL/uL — ABNORMAL HIGH (ref 3.87–5.11)
RDW: 17.8 % — ABNORMAL HIGH (ref 11.5–15.5)
WBC: 5.4 10*3/uL (ref 4.0–10.5)
nRBC: 0.7 % — ABNORMAL HIGH (ref 0.0–0.2)

## 2019-06-13 LAB — GLUCOSE, CAPILLARY
Glucose-Capillary: 95 mg/dL (ref 70–99)
Glucose-Capillary: 174 mg/dL — ABNORMAL HIGH (ref 70–99)

## 2019-06-13 LAB — COMPREHENSIVE METABOLIC PANEL
ALT: 48 U/L — ABNORMAL HIGH (ref 0–44)
AST: 82 U/L — ABNORMAL HIGH (ref 15–41)
Albumin: 2.3 g/dL — ABNORMAL LOW (ref 3.5–5.0)
Alkaline Phosphatase: 115 U/L (ref 38–126)
Anion gap: 12 (ref 5–15)
BUN: 23 mg/dL (ref 8–23)
CO2: 27 mmol/L (ref 22–32)
Calcium: 8.5 mg/dL — ABNORMAL LOW (ref 8.9–10.3)
Chloride: 98 mmol/L (ref 98–111)
Creatinine, Ser: 1.12 mg/dL — ABNORMAL HIGH (ref 0.44–1.00)
GFR calc Af Amer: >60 mL/min (ref >60)
GFR calc non Af Amer: 52 mL/min — ABNORMAL LOW (ref >60)
Glucose, Bld: 111 mg/dL — ABNORMAL HIGH (ref 70–99)
Potassium: 4.5 mmol/L (ref 3.5–5.1)
Sodium: 137 mmol/L (ref 135–145)
Total Bilirubin: 1 mg/dL (ref 0.3–1.2)
Total Protein: 6.2 g/dL — ABNORMAL LOW (ref 6.5–8.1)

## 2019-06-13 LAB — PROTIME-INR
INR: 2.8 — ABNORMAL HIGH (ref 0.8–1.2)
Prothrombin Time: 29.1 seconds — ABNORMAL HIGH (ref 11.4–15.2)

## 2019-06-13 LAB — MAGNESIUM: Magnesium: 2 mg/dL (ref 1.7–2.4)

## 2019-06-13 MED ORDER — FUROSEMIDE 40 MG PO TABS: 40.0000 mg | ORAL_TABLET | Freq: Two times a day (BID) | ORAL | 1 refills | Status: AC

## 2019-06-13 MED ORDER — WARFARIN SODIUM 2.5 MG PO TABS: 2.5000 mg | ORAL_TABLET | Freq: Once | ORAL | Status: DC

## 2019-06-13 MED ORDER — HYDROCORTISONE (PERIANAL) 2.5 % EX CREA: 1.0000 "application " | TOPICAL_CREAM | Freq: Four times a day (QID) | CUTANEOUS | 0 refills | Status: AC | PRN

## 2019-06-13 MED ORDER — GUAIFENESIN 100 MG/5ML PO SOLN: 15.0000 mL | Freq: Four times a day (QID) | ORAL | 0 refills | Status: AC | PRN

## 2019-06-13 MED ORDER — GLUCERNA PO LIQD: 237.0000 mL | Freq: Every day | ORAL | 0 refills | Status: AC

## 2019-06-13 MED ORDER — LORATADINE 10 MG PO TABS: 10.0000 mg | ORAL_TABLET | Freq: Every day | ORAL | 0 refills | Status: AC | PRN

## 2019-06-13 MED ORDER — PHENOL 1.4 % MT LIQD: 1.0000 | OROMUCOSAL | 0 refills | Status: AC | PRN

## 2019-06-13 MED ORDER — WARFARIN SODIUM 3 MG PO TABS: 3.0000 mg | ORAL_TABLET | Freq: Once | ORAL | 0 refills | Status: AC

## 2019-06-13 MED ORDER — POLYVINYL ALCOHOL 1.4 % OP SOLN: 1.0000 [drp] | OPHTHALMIC | 0 refills | Status: AC | PRN

## 2019-06-13 MED ORDER — ACETAMINOPHEN 500 MG PO CAPS: 500.0000 mg | ORAL_CAPSULE | Freq: Every day | ORAL | 0 refills | Status: AC | PRN

## 2019-06-13 NOTE — Progress Notes (Signed)
Patient sitting on side of bed crying states supposed to go home today has taken her tele off CCMD informed she will not let writer put her tele back on writer notified Dr.Ogbata he reported would let her go home.

## 2019-06-13 NOTE — Progress Notes (Signed)
Patient's POA- Betty requests for MD to contact her today with updates on patient.  This RN updated patient's POA of patient's night.

## 2019-06-13 NOTE — Plan of Care (Signed)
  Problem: Clinical Measurements: Goal: Will remain free from infection Outcome: Progressing Goal: Respiratory complications will improve Outcome: Progressing Goal: Cardiovascular complication will be avoided Outcome: Progressing   Problem: Activity: Goal: Risk for activity intolerance will decrease Outcome: Progressing   Problem: Elimination: Goal: Will not experience complications related to urinary retention Outcome: Progressing   Problem: Pain Managment: Goal: General experience of comfort will improve Outcome: Progressing   Problem: Safety: Goal: Ability to remain free from injury will improve Outcome: Progressing   Problem: Cardiac: Goal: Ability to achieve and maintain adequate cardiopulmonary perfusion will improve Outcome: Progressing

## 2019-06-13 NOTE — Progress Notes (Signed)
ANTICOAGULATION CONSULT NOTE - Follow-up Consult  Pharmacy Consult for Warfarin  Indication: stroke, mural thrombus  Allergies  Allergen Reactions  . Metformin And Related Nausea And Vomiting  . Lipitor [Atorvastatin] Other (See Comments)    Stomach pains    Patient Measurements: Height: 5\' 3"  (160 cm) Weight: 128 lb 9.6 oz (58.3 kg) IBW/kg (Calculated) : 52.4  Vital Signs: Temp: 97.7 F (36.5 C) (07/19 0537) Temp Source: Oral (07/19 0537) BP: 103/78 (07/19 0537) Pulse Rate: 84 (07/19 0537)  Labs: Recent Labs    06/11/19 0150 06/11/19 0610 06/12/19 0345 06/13/19 0337  HGB 13.9  --  15.9* 15.2*  HCT 42.8  --  48.8* 46.0  PLT 223  --  235 228  LABPROT 26.9*  --  34.2* 29.1*  INR 2.5*  --  3.4* 2.8*  CREATININE 0.97 0.92 1.17* 1.12*    Estimated Creatinine Clearance: 42 mL/min (A) (by C-G formula based on SCr of 1.12 mg/dL (H)).   Medical History: Past Medical History:  Diagnosis Date  . Acute combined systolic and diastolic HF (heart failure) ([place]) 10/16/2018  . Allergic rhinitis    Requires cetirizine, singulair, and fluticasone.  . Anxiety    Has been on Xanax since 2009. Uses it for stress, anxiety, and insomnia. No contract yet.  . Arthritis   . CHF (congestive heart failure) (HCC)    EF 35% after stroke, presumed ischemic  . Chronic pain    Has OA of knees B. No Xrays in echart. Not requiring narcotics.  . Colitis 12/2017  . Depression   . Diabetes mellitus    Type 2, non insulin dependent. Was dx'd prior to 2008.  Marland Kitchen Hyperglycemia   . Hyperlipemia   . Hypertension   . Sickle cell trait ([place])   . 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 Admit INR 2.5 on PTA dose of 5 mg daily EXCEPT for 7.5 mg on Mondays only. Pharmacy consulted to resume dosing this admission. CBC stable - no bleeding noted.  INR today is 2.8   Goal of Therapy:   INR 2-3 Monitor platelets by anticoagulation protocol: Yes   Plan:  - Warfarin 2.5 mg x1 - Daily INR  - Monitor for s/s of bleeding  Lorel Monaco, PharmD PGY1 Ambulatory Care Resident [employer] # [phone]

## 2019-06-13 NOTE — Discharge Summary (Signed)
Physician Discharge Summary  Patient ID: Kristen Lozano MRN: [id number] DOB/AGE: December 31, 1955 63 y.o.  Admit date: 06/10/2019 Discharge date: 06/13/2019  Admission Diagnoses:  Discharge Diagnoses:  Principal Problem:   Acute CHF (congestive heart failure) ([place]) Active Problems:   Anxiety state   Chronic pain   Essential hypertension   Diabetes mellitus ([place])   Dilated cardiomyopathy (HCC)   GERD (gastroesophageal reflux disease)   Discharged Condition: stable  Hospital Course: Patient is a 63 year old female with past medical history significant for systolic CHF with ejection fraction of 20%, essential hypertension, CVA, diabetes mellitus type 2, depression and chronic pain.  Patient was admitted for further assessment and management of CHF exacerbation.  Patient was managed medically with IV Lasix.  Patient has improved significantly will be discharged to the care of the primary care provider.   Hypokalemia/hypomagnesemia: -Repleted.   -Potassium prior to discharge was 4.3.      Essential hypertension: -Optimized.   -Continue current medication.    Diabetes mellitus type 2: - Optimized  GERD -PPI  Chronic pain:: -Continued home medication.    Anxiety/depression: -Continued home medication.    Chronic anticoagulation for history of left ventricular mural thrombus: -On Coumadin.   Consults: None  Discharge Exam: Blood pressure (!) 131/92, pulse 93, temperature 97.8 F (36.6 C), temperature source Oral, resp. rate 20, height 5' 3"  (1.6 m), weight 58.3 kg, SpO2 100 %.  Disposition: Discharge disposition: 01-Home or Self Care   Discharge Instructions    Call MD for:   Complete by: As directed    Diet - low sodium heart healthy   Complete by: As directed    Increase activity slowly   Complete by: As directed      Allergies as of 06/13/2019      Reactions   Metformin And Related Nausea And Vomiting   Lipitor [atorvastatin] Other (See Comments)   Stomach pains      Medication List    STOP taking these medications   bisacodyl 10 MG suppository Commonly known as: DULCOLAX   oxyCODONE 5 MG immediate release tablet Commonly known as: Oxy IR/ROXICODONE   prochlorperazine 5 MG tablet Commonly known as: COMPAZINE     TAKE these medications   Acetaminophen 500 MG coapsule Take 1 capsule (500 mg total) by mouth daily as needed. What changed: reasons to take this   ASPIRIN LOW DOSE 81 MG EC tablet Generic drug: aspirin Take 1 tablet (81 mg total) by mouth daily. What changed: See the new instructions.   docusate sodium 100 MG capsule Commonly known as: COLACE Take 1 capsule (100 mg total) by mouth 2 (two) times daily as needed for mild constipation.   furosemide 40 MG tablet Commonly known as: LASIX Take 1 tablet (40 mg total) by mouth 2 (two) times daily. Take the second dose around 3 PM What changed:   when to take this  additional instructions   Glucerna Liqd Take 237 mLs by mouth daily.   guaiFENesin 100 MG/5ML Soln Commonly known as: ROBITUSSIN Take 15 mLs (300 mg total) by mouth every 6 (six) hours as needed for cough. What changed:   when to take this  reasons to take this   hydrocortisone 2.5 % rectal cream Commonly known as: ANUSOL-HC Apply 1 application topically 4 (four) times daily as needed for hemorrhoids.   insulin glargine 100 unit/mL Sopn Commonly known as: LANTUS Inject 15 Units into the skin at bedtime.   ivabradine 5 MG Tabs tablet Commonly known as: [employer]  Take 1 tablet (5 mg total) by mouth 2 (two) times daily with a meal. What changed: when to take this   loratadine 10 MG tablet Commonly known as: CLARITIN Take 1 tablet (10 mg total) by mouth daily as needed for allergies.   losartan 25 MG tablet Commonly known as: COZAAR Take 0.5 tablets (12.5 mg total) by mouth daily. <PLEASE MAKE APPOINTMENT FOR REFILLS>   metoprolol succinate 100 MG 24 hr tablet Commonly known as:  TOPROL-XL Take 1 tablet (100 mg total) by mouth every morning. Take with or immediately following a meal. In the Morning What changed: when to take this   metoprolol succinate 50 MG 24 hr tablet Commonly known as: TOPROL-XL Take 1 tablet (50 mg total) by mouth at bedtime. Take with or immediately following a meal. <PLEASE MAKE APPOINTMENT FOR REFILLS> What changed:   how much to take  when to take this   pantoprazole 40 MG tablet Commonly known as: PROTONIX Take 1 tablet (40 mg total) by mouth daily with supper. What changed:   when to take this  reasons to take this   phenol 1.4 % Liqd Commonly known as: CHLORASEPTIC Use as directed 1 spray in the mouth or throat as needed for throat irritation / pain.   polyethylene glycol 17 g packet Commonly known as: MIRALAX / GLYCOLAX Take 17 g by mouth daily as needed.   polyvinyl alcohol 1.4 % ophthalmic solution Commonly known as: LIQUIFILM TEARS Place 1 drop into both eyes as needed for dry eyes.   potassium chloride 10 MEQ tablet Commonly known as: K-DUR Take 1 tablet (10 mEq total) by mouth daily. What changed: how much to take   sucralfate 1 GM/10ML suspension Commonly known as: Carafate Take 10 mLs (1 g total) by mouth 4 (four) times daily -  with meals and at bedtime for 10 days.   warfarin 3 MG tablet Commonly known as: COUMADIN Take as directed. If you are unsure how to take this medication, talk to your nurse or doctor. Original instructions: Take 1 tablet (3 mg total) by mouth one time only at 6 PM. What changed:   medication strength  how much to take  how to take this  when to take this  additional instructions        Signed: Bonnell Public 06/13/2019, 12:43 PM

## 2019-06-21 ENCOUNTER — Other Ambulatory Visit: Payer: Self-pay

## 2019-06-21 ENCOUNTER — Encounter: Payer: Self-pay | Admitting: Internal Medicine

## 2019-06-21 ENCOUNTER — Telehealth: Payer: Self-pay | Admitting: Internal Medicine

## 2019-06-21 ENCOUNTER — Ambulatory Visit (INDEPENDENT_AMBULATORY_CARE_PROVIDER_SITE_OTHER): Payer: Medicare HMO | Admitting: Internal Medicine

## 2019-06-21 VITALS — BP 126/80 | HR 56 | Temp 97.3°F | Ht 63.5 in | Wt 124.4 lb

## 2019-06-21 DIAGNOSIS — I5023 Acute on chronic systolic (congestive) heart failure: Secondary | ICD-10-CM | POA: Diagnosis not present

## 2019-06-21 DIAGNOSIS — Z8673 Personal history of transient ischemic attack (TIA), and cerebral infarction without residual deficits: Secondary | ICD-10-CM

## 2019-06-21 DIAGNOSIS — I428 Other cardiomyopathies: Secondary | ICD-10-CM

## 2019-06-21 MED ORDER — ENTRESTO 49-51 MG PO TABS: 1.0000 | ORAL_TABLET | Freq: Two times a day (BID) | ORAL | 4 refills | Status: AC

## 2019-06-21 NOTE — Progress Notes (Signed)
OFFICE NOTE  Chief Complaint:  Follow-up heart failure  Primary Care Physician: Audley Hose, MD  HPI:  Kristen Lozano is a 63 y.o. female with a past medial history significant HTN, DM2, sickle cell trait, OA.  She was admitted recently with a L brain CVA and Echo/TEE demonstrated EF ~ 35%.  Work up included a nuclear stress test which demonstrated ant-septal and inf scar with small area of distal anterior ischemia.  However, cath was felt to be contraindicated in the setting of a recent CVA.  Coumadin was considered given reduced EF and likely thromboembolic CVA.  However, the family was hesitant to start this medication.  Prior to DC Dr. Percival Spanish mentioned an EP eval for possible LINQ device.  It does not appear that this was done.  Unfortunately, the DC summary has not been completed.  She returns for follow up.  She is here with her sister. She still continues to struggle with some expressive aphasia. She denies chest pain, dyspnea, syncope, dizziness, orthopnea, PND or edema. She appears to have a difficult situation at home. Her sister tells me that her husband does not allow her to take some medications at times. Apparently, the [facility] is coming out to discuss medications further with the patient and her husband. Her sugars have been increasing. She was apparently taken off of insulin and placed on metformin at discharge.   Studies:  - Echo (10/15): EF 35% to 40%. Severe global HK,  Gr 1 diastolic dysfunction, mild MR, mild TR, PA peak pressure: 28 mm Hg (S).  - TEE (10/15):  No LAA thrombus.  No PFO  LVEF 30-35%  - Nuclear (10/15):  Anteroseptal and inferior wall scar. Potential inducible ischemia in a small region of the distal anterior wall. Global hypokinesis and near akinesis of the septum. EF 37%; Intermediate-risk   - Carotid US (10/15):  1-39% internal carotid artery stenosis bilaterally   Kristen Lozano returns today for follow-up. She is asymptomatic. She continues to have  significant expressive aphasia. The family is not interested in cardiac catheterization due to the risk of recurrent stroke. Her stress test indicated anteroseptal and inferior wall scar with possible small area potential reversible ischemia. They understand that she could have multivessel coronary disease however are concerned about the risks of heart catheterization. At this point they wish to elect medical therapy.  12/16/2016  Kristen Lozano returns today for follow-up. She was recently hospitalized for hyperglycemia, confusion and vomiting. While there was concern for LV thrombus in the past this was again identified on echo during this hospitalization. She was started on warfarin and INR is 1.9 today. EF however did improve from 30-35% up to 40-45% on echo. She is on carvedilol and lisinopril as well as low-dose aspirin and warfarin. Blood pressure is mildly reduced today however she seems to be asymptomatic with this.  10/07/2018  Kristen Lozano is seen today in follow-up.  She was hospitalized in September 2019 with acute on chronic systolic and diastolic heart failure as well as persistent nausea and vomiting and abdominal pain.  At the time she was seen by Dr. Angelena Form in consultation.  A repeat echo shows further decline in LVEF down to 20 to 25%.  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  A thrombus was not noted during this study.  It was also mentioned that she had not had a prior ischemic evaluation.  She had declined ischemia evaluation at the time when  I first saw her due to recent stroke, but did not follow-up for pursuing this.  She is back again today accompanied by her sister.  At this point she is not really able to make decisions on her own and her sister is her power of attorney.  We discussed the worsening cardiomyopathy and possibility given her history of diabetes and other cardia vascular risk factors that this could certainly be an ischemic  cardiomyopathy.  My recommendation would be for heart catheterization. Her sister notes that the patient has been doing well, in fact denies any shortness of breath and is able to get around without difficulty.  06/21/2019  Kristen Lozano is seen today in follow-up.  He was hospitalized for acute congestive heart failure discharged on June 13, 2019.  This was apparently precipitated by the recent death of their daughter which made her upset and she was perhaps noncompliant with diet or medications.  She was diuresed and now is on 40 mg of Lasix twice daily.  Discharge weight was 58 kg and today she is 56 kg.  She denies any worsening shortness of breath or chest pain.  Blood pressure was assessed at 126/80.  Unfortunately, her last echo in January showed no significant improvement in LVEF which was 20 to 25%.  Her cardiac catheterization last year demonstrated no significant coronary disease with severe global hypokinesis.  Medical therapy was recommended.  She had persistent tachycardia despite a beta-blocker and was started on ivabradine.  Heart rate today is in the 50s.  We had previously discussed starting her on Entresto but had not had an opportunity to do that.  PMHx:  Past Medical History:  Diagnosis Date   Acute combined systolic and diastolic HF (heart failure) ([place]) 10/16/2018   Allergic rhinitis    Requires cetirizine, singulair, and fluticasone.   Anxiety    Has been on Xanax since 2009. Uses it for stress, anxiety, and insomnia. No contract yet.   Arthritis    CHF (congestive heart failure) (HCC)    EF 35% after stroke, presumed ischemic   Chronic pain    Has OA of knees B. No Xrays in echart. Not requiring narcotics.   Colitis 12/2017   Depression    Diabetes mellitus    Type 2, non insulin dependent. Was dx'd prior to 2008.   Hyperglycemia    Hyperlipemia    Hypertension    Sickle cell trait ([place])    Stroke ([place]) 09/05/14   Dominant left MCA infarcts secondary to  unknown embolic source     Past Surgical History:  Procedure Laterality Date   BIOPSY  09/23/2018   Procedure: BIOPSY;  Surgeon: Yetta Flock, MD;  Location: [facility];  Service: Gastroenterology;;   ESOPHAGOGASTRODUODENOSCOPY (EGD) WITH PROPOFOL N/A 09/23/2018   Procedure: ESOPHAGOGASTRODUODENOSCOPY (EGD) WITH PROPOFOL;  Surgeon: Yetta Flock, MD;  Location: [facility];  Service: Gastroenterology;  Laterality: N/A;   RIGHT/LEFT HEART CATH AND CORONARY ANGIOGRAPHY N/A 10/14/2018   Procedure: RIGHT/LEFT HEART CATH AND CORONARY ANGIOGRAPHY;  Surgeon: Troy Sine, MD;  Location: [place] [facility];  Service: Cardiovascular;  Laterality: N/A;   TEE WITHOUT CARDIOVERSION N/A 09/06/2014   Procedure: TRANSESOPHAGEAL ECHOCARDIOGRAM (TEE);  Surgeon: Pixie Casino, MD;  Location: [facility];  Service: Cardiovascular;  Laterality: N/A;    FAMHx:  Family History  Problem Relation Age of Onset   Diabetes Mother    Hypertension Mother    Heart disease Father    Heart attack Father  Hypertension Father    Diabetes Father    Ovarian cancer Sister    Liver cancer Sister    Sickle cell anemia Daughter    Schizophrenia Daughter    Hypertension Sister    Hypertension Brother    Hypertension Daughter    Kidney disease Sister        x2   Kidney disease Brother    Stroke Neg Hx    Esophageal cancer Neg Hx    Colon cancer Neg Hx    Colon polyps Neg Hx     SOCHx:   reports that she has never smoked. She has never used smokeless tobacco. She reports that she does not drink alcohol or use drugs.  ALLERGIES:  Allergies  Allergen Reactions   Metformin And Related Nausea And Vomiting   Lipitor [Atorvastatin] Other (See Comments)    Stomach pains    ROS: Pertinent items noted in HPI and remainder of comprehensive ROS otherwise negative.  HOME MEDS: Current Outpatient Medications on File Prior to Visit  Medication Sig Dispense Refill     Acetaminophen 500 MG coapsule Take 1 capsule (500 mg total) by mouth daily as needed. 30 capsule 0   ASPIRIN LOW DOSE 81 MG EC tablet Take 1 tablet (81 mg total) by mouth daily. (Patient taking differently: Take 81 mg by mouth daily. ) 30 tablet 6   docusate sodium (COLACE) 100 MG capsule Take 1 capsule (100 mg total) by mouth 2 (two) times daily as needed for mild constipation.     furosemide (LASIX) 40 MG tablet Take 1 tablet (40 mg total) by mouth 2 (two) times daily. Take the second dose around 3 PM 90 tablet 1   Glucerna (GLUCERNA) LIQD Take 237 mLs by mouth daily. 7110 mL 0   guaiFENesin (ROBITUSSIN) 100 MG/5ML SOLN Take 15 mLs (300 mg total) by mouth every 6 (six) hours as needed for cough. 236 mL 0   hydrocortisone (ANUSOL-HC) 2.5 % rectal cream Apply 1 application topically 4 (four) times daily as needed for hemorrhoids. 30 g 0   insulin glargine (LANTUS) 100 unit/mL SOPN Inject 15 Units into the skin at bedtime.     ivabradine (CORLANOR) 5 MG TABS tablet Take 1 tablet (5 mg total) by mouth 2 (two) times daily with a meal. (Patient taking differently: Take 5 mg by mouth daily. ) 60 tablet 0   loratadine (CLARITIN) 10 MG tablet Take 1 tablet (10 mg total) by mouth daily as needed for allergies. 30 tablet 0   metoprolol succinate (TOPROL-XL) 100 MG 24 hr tablet Take 1 tablet (100 mg total) by mouth every morning. Take with or immediately following a meal. In the Morning (Patient taking differently: Take 100 mg by mouth every morning. Take with or immediately following a meal.  In the Morning) 90 tablet 1   metoprolol succinate (TOPROL-XL) 50 MG 24 hr tablet Take 1 tablet (50 mg total) by mouth at bedtime. Take with or immediately following a meal. <PLEASE MAKE APPOINTMENT FOR REFILLS> (Patient taking differently: Take 25 mg by mouth every evening. Take with or immediately following a meal. <PLEASE MAKE APPOINTMENT FOR REFILLS>) 90 tablet 1   pantoprazole (PROTONIX) 40 MG tablet  Take 1 tablet (40 mg total) by mouth daily with supper. (Patient taking differently: Take 40 mg by mouth daily as needed (indigestion). ) 30 tablet 4   phenol (CHLORASEPTIC) 1.4 % LIQD Use as directed 1 spray in the mouth or throat as needed for throat irritation / pain.  250 mL 0   polyethylene glycol (MIRALAX / GLYCOLAX) packet Take 17 g by mouth daily as needed. 14 each 0   polyvinyl alcohol (LIQUIFILM TEARS) 1.4 % ophthalmic solution Place 1 drop into both eyes as needed for dry eyes. 15 mL 0   potassium chloride (K-DUR) 10 MEQ tablet Take 1 tablet (10 mEq total) by mouth daily. (Patient taking differently: Take 5 mEq by mouth daily. ) 30 tablet 0   warfarin (COUMADIN) 3 MG tablet Take 1 tablet (3 mg total) by mouth one time only at 6 PM. 30 tablet 0   sucralfate (CARAFATE) 1 GM/10ML suspension Take 10 mLs (1 g total) by mouth 4 (four) times daily -  with meals and at bedtime for 10 days. (Patient not taking: Reported on 06/12/2019) 420 mL 0   No current facility-administered medications on file prior to visit.     LABS/IMAGING: No results found for this or any previous visit (from the past 48 hour(s)). No results found.  LIPID PANEL:    Component Value Date/Time   CHOL 89 12/17/2018 0521   TRIG 43 12/17/2018 0521   HDL 47 12/17/2018 0521   CHOLHDL 1.9 12/17/2018 0521   VLDL 9 12/17/2018 0521   LDLCALC 33 12/17/2018 0521     WEIGHTS: Wt Readings from Last 3 Encounters:  06/21/19 124 lb 6.4 oz (56.4 kg)  06/13/19 128 lb 9.6 oz (58.3 kg)  12/31/18 106 lb 14.8 oz (48.5 kg)    VITALS: Pulse (!) 56    Temp (!) 97.3 F (36.3 C)    Ht 5' 3.5" (1.613 m)    Wt 124 lb 6.4 oz (56.4 kg)    BMI 21.69 kg/m   EXAM: General appearance: alert and no distress Neck: no carotid bruit, no JVD and thyroid not enlarged, symmetric, no tenderness/mass/nodules Lungs: clear to auscultation bilaterally Heart: regular rate and rhythm Abdomen: soft, non-tender; bowel sounds normal; no masses,   no organomegaly Extremities: extremities normal, atraumatic, no cyanosis or edema Pulses: 2+ and symmetric Skin: Skin color, texture, turgor normal. No rashes or lesions Neurologic: Mental status: Quiet, does not seem to understand questions Psych: Flat affect  EKG: Deferred  ASSESSMENT: 1. Acute on chronic systolic congestive heart failure, LVEF 20 to 25% (07/2018) 2. History of ischemic stroke secondary to presumed LV thrombus 3. Nonischemic cardiomyopathy-no significant coronary disease by cath (09/2018) 4. Diabetes 5. Hypertension 6. Dyslipidemia  PLAN: 1.   Ms. Decatur was recently admitted with acute on chronic systolic congestive heart failure.  She had weight gain and swelling and shortness of breath.  She was diuresed and follows up today.  She is now on twice daily Lasix.  Her EF is not significantly improved.  She had no significant coronary disease by cath in November.  I think that we do have room in the blood pressure to add Entresto.  I would discontinue her losartan and place her on Entresto 49/51 mg daily.  Continuing Toprol XL as well as ivabradine.  We will plan a repeat echo in 6 months as well as follow-up with me in 3 months to potentially titrate up her Entresto.  Pixie Casino, MD, [facility], [place] Director of the Advanced Lipid Disorders &  Cardiovascular Risk Reduction Clinic Diplomate of the American Board of Clinical Lipidology Attending Cardiologist  Direct Dial: [phone]   Fax: [phone]  Website:  [email or web address] Osgood Jamil Castillo 06/21/2019, 5:05 PM

## 2019-06-21 NOTE — Telephone Encounter (Signed)
° ° °  COVID-19 Pre-Screening Questions:   In the past 7 to 10 days have you had a cough,  shortness of breath, headache, congestion, fever (100 or greater) body aches, chills, sore throat, or sudden loss of taste or sense of smell? No  Have you been around anyone with known Covid 19.  Have you been around anyone who is awaiting Covid 19 test results in the past 7 to 10 days? no  Have you been around anyone who has been exposed to Covid 19, or has mentioned symptoms of Covid 19 within the past 7 to 10 days? no  If you have any concerns/questions about symptoms patients report during screening (either on the phone or at threshold). Contact the provider seeing the patient or DOD for further guidance.  If neither are available contact a member of the leadership team.

## 2019-06-21 NOTE — Patient Instructions (Addendum)
Medication Instructions:  STOP the Losartan START Entresto 49-51 mg twice daily  If you need a refill on your cardiac medications before your next appointment, please call your pharmacy.   Lab work: None ordered If you have labs (blood work) drawn today and your tests are completely normal, you will receive your results only by: Marland Kitchen MyChart Message (if you have MyChart) OR . A paper copy in the mail If you have any lab test that is abnormal or we need to change your treatment, we will call you to review the results.  Testing/Procedures: Your physician has requested that you have an limited echocardiogram in 6 months. Echocardiography is a painless test that uses sound waves to create images of your heart. It provides your doctor with information about the size and shape of your heart and how well your heart's chambers and valves are working. You may receive an ultrasound enhancing agent through an IV if needed to better visualize your heart during the echo.This procedure takes approximately one hour. There are no restrictions for this procedure. This will take place at the [address].    Follow-Up: At [facility], you and your health needs are our priority.  As part of our continuing mission to provide you with exceptional heart care, we have created designated Provider Care Teams.  These Care Teams include your primary Cardiologist (physician) and Advanced Practice Providers (APPs -  Physician Assistants and Nurse Practitioners) who all work together to provide you with the care you need, when you need it. You will need a follow up appointment in 3 months.  You may see Pixie Casino, MD or one of the following Advanced Practice Providers on your designated Care Team: [place], [place] . Fabian Sharp, PA-C

## 2019-06-22 ENCOUNTER — Telehealth: Payer: Self-pay | Admitting: *Deleted

## 2019-06-22 NOTE — Telephone Encounter (Signed)
Left message for pt to call back to Prescreen for Covid 19.

## 2019-06-30 ENCOUNTER — Ambulatory Visit (INDEPENDENT_AMBULATORY_CARE_PROVIDER_SITE_OTHER): Payer: Medicare HMO | Admitting: Pharmacist

## 2019-06-30 ENCOUNTER — Other Ambulatory Visit: Payer: Self-pay

## 2019-06-30 DIAGNOSIS — I236 Thrombosis of atrium, auricular appendage, and ventricle as current complications following acute myocardial infarction: Secondary | ICD-10-CM

## 2019-06-30 DIAGNOSIS — Z7901 Long term (current) use of anticoagulants: Secondary | ICD-10-CM

## 2019-06-30 DIAGNOSIS — Z5181 Encounter for therapeutic drug level monitoring: Secondary | ICD-10-CM | POA: Diagnosis not present

## 2019-06-30 LAB — POCT INR: INR: 3.1 — AB (ref 2.0–3.0)

## 2019-06-30 NOTE — Patient Instructions (Signed)
Description   Take 1/2 tablet today, then continue taking 1 pill everyday except 1.5 pills on Mondays.  Recheck in 5 weeks.  Eat Green vegetables 2 times a week.  [facility] [phone] Main [phone]

## 2019-07-19 ENCOUNTER — Other Ambulatory Visit: Payer: Self-pay

## 2019-07-19 ENCOUNTER — Inpatient Hospital Stay (HOSPITAL_COMMUNITY)
Admission: EM | Admit: 2019-07-19 | Discharge: 2019-07-27 | DRG: 291 | Disposition: E | Payer: Medicare HMO | Attending: Pulmonary Disease | Admitting: Pulmonary Disease

## 2019-07-19 ENCOUNTER — Encounter (HOSPITAL_COMMUNITY): Payer: Self-pay

## 2019-07-19 DIAGNOSIS — E785 Hyperlipidemia, unspecified: Secondary | ICD-10-CM | POA: Diagnosis present

## 2019-07-19 DIAGNOSIS — I429 Cardiomyopathy, unspecified: Secondary | ICD-10-CM

## 2019-07-19 DIAGNOSIS — R4701 Aphasia: Secondary | ICD-10-CM | POA: Diagnosis present

## 2019-07-19 DIAGNOSIS — M170 Bilateral primary osteoarthritis of knee: Secondary | ICD-10-CM | POA: Diagnosis present

## 2019-07-19 DIAGNOSIS — F419 Anxiety disorder, unspecified: Secondary | ICD-10-CM | POA: Diagnosis present

## 2019-07-19 DIAGNOSIS — Z7901 Long term (current) use of anticoagulants: Secondary | ICD-10-CM

## 2019-07-19 DIAGNOSIS — E440 Moderate protein-calorie malnutrition: Secondary | ICD-10-CM | POA: Diagnosis present

## 2019-07-19 DIAGNOSIS — Z832 Family history of diseases of the blood and blood-forming organs and certain disorders involving the immune mechanism: Secondary | ICD-10-CM

## 2019-07-19 DIAGNOSIS — Z8041 Family history of malignant neoplasm of ovary: Secondary | ICD-10-CM

## 2019-07-19 DIAGNOSIS — Z841 Family history of disorders of kidney and ureter: Secondary | ICD-10-CM

## 2019-07-19 DIAGNOSIS — I472 Ventricular tachycardia: Secondary | ICD-10-CM | POA: Diagnosis present

## 2019-07-19 DIAGNOSIS — R1915 Other abnormal bowel sounds: Secondary | ICD-10-CM

## 2019-07-19 DIAGNOSIS — Z8249 Family history of ischemic heart disease and other diseases of the circulatory system: Secondary | ICD-10-CM

## 2019-07-19 DIAGNOSIS — J942 Hemothorax: Secondary | ICD-10-CM

## 2019-07-19 DIAGNOSIS — D6489 Other specified anemias: Secondary | ICD-10-CM | POA: Diagnosis present

## 2019-07-19 DIAGNOSIS — R188 Other ascites: Secondary | ICD-10-CM

## 2019-07-19 DIAGNOSIS — R570 Cardiogenic shock: Secondary | ICD-10-CM

## 2019-07-19 DIAGNOSIS — K5900 Constipation, unspecified: Secondary | ICD-10-CM | POA: Diagnosis not present

## 2019-07-19 DIAGNOSIS — G3184 Mild cognitive impairment, so stated: Secondary | ICD-10-CM | POA: Diagnosis present

## 2019-07-19 DIAGNOSIS — I4729 Other ventricular tachycardia: Secondary | ICD-10-CM

## 2019-07-19 DIAGNOSIS — R112 Nausea with vomiting, unspecified: Secondary | ICD-10-CM | POA: Diagnosis present

## 2019-07-19 DIAGNOSIS — E1159 Type 2 diabetes mellitus with other circulatory complications: Secondary | ICD-10-CM | POA: Diagnosis present

## 2019-07-19 DIAGNOSIS — Z8673 Personal history of transient ischemic attack (TIA), and cerebral infarction without residual deficits: Secondary | ICD-10-CM

## 2019-07-19 DIAGNOSIS — E876 Hypokalemia: Secondary | ICD-10-CM | POA: Diagnosis present

## 2019-07-19 DIAGNOSIS — E872 Acidosis: Secondary | ICD-10-CM | POA: Diagnosis present

## 2019-07-19 DIAGNOSIS — I236 Thrombosis of atrium, auricular appendage, and ventricle as current complications following acute myocardial infarction: Secondary | ICD-10-CM | POA: Diagnosis present

## 2019-07-19 DIAGNOSIS — I10 Essential (primary) hypertension: Secondary | ICD-10-CM | POA: Diagnosis present

## 2019-07-19 DIAGNOSIS — I509 Heart failure, unspecified: Secondary | ICD-10-CM

## 2019-07-19 DIAGNOSIS — N182 Chronic kidney disease, stage 2 (mild): Secondary | ICD-10-CM | POA: Diagnosis present

## 2019-07-19 DIAGNOSIS — Z20828 Contact with and (suspected) exposure to other viral communicable diseases: Secondary | ICD-10-CM | POA: Diagnosis present

## 2019-07-19 DIAGNOSIS — R110 Nausea: Secondary | ICD-10-CM

## 2019-07-19 DIAGNOSIS — Z79899 Other long term (current) drug therapy: Secondary | ICD-10-CM

## 2019-07-19 DIAGNOSIS — Z794 Long term (current) use of insulin: Secondary | ICD-10-CM

## 2019-07-19 DIAGNOSIS — Z833 Family history of diabetes mellitus: Secondary | ICD-10-CM

## 2019-07-19 DIAGNOSIS — I4901 Ventricular fibrillation: Secondary | ICD-10-CM | POA: Diagnosis not present

## 2019-07-19 DIAGNOSIS — F329 Major depressive disorder, single episode, unspecified: Secondary | ICD-10-CM | POA: Diagnosis present

## 2019-07-19 DIAGNOSIS — N170 Acute kidney failure with tubular necrosis: Secondary | ICD-10-CM | POA: Diagnosis present

## 2019-07-19 DIAGNOSIS — Z7982 Long term (current) use of aspirin: Secondary | ICD-10-CM

## 2019-07-19 DIAGNOSIS — J9601 Acute respiratory failure with hypoxia: Secondary | ICD-10-CM | POA: Diagnosis present

## 2019-07-19 DIAGNOSIS — Z66 Do not resuscitate: Secondary | ICD-10-CM | POA: Diagnosis not present

## 2019-07-19 DIAGNOSIS — Z888 Allergy status to other drugs, medicaments and biological substances status: Secondary | ICD-10-CM

## 2019-07-19 DIAGNOSIS — D573 Sickle-cell trait: Secondary | ICD-10-CM | POA: Diagnosis present

## 2019-07-19 DIAGNOSIS — R109 Unspecified abdominal pain: Secondary | ICD-10-CM

## 2019-07-19 DIAGNOSIS — I493 Ventricular premature depolarization: Secondary | ICD-10-CM | POA: Diagnosis present

## 2019-07-19 DIAGNOSIS — I428 Other cardiomyopathies: Secondary | ICD-10-CM

## 2019-07-19 DIAGNOSIS — I469 Cardiac arrest, cause unspecified: Secondary | ICD-10-CM

## 2019-07-19 DIAGNOSIS — J90 Pleural effusion, not elsewhere classified: Secondary | ICD-10-CM | POA: Diagnosis present

## 2019-07-19 DIAGNOSIS — Z6821 Body mass index (BMI) 21.0-21.9, adult: Secondary | ICD-10-CM

## 2019-07-19 DIAGNOSIS — I5043 Acute on chronic combined systolic (congestive) and diastolic (congestive) heart failure: Secondary | ICD-10-CM | POA: Diagnosis present

## 2019-07-19 DIAGNOSIS — Z800 Family history of malignant neoplasm of digestive organs: Secondary | ICD-10-CM

## 2019-07-19 DIAGNOSIS — Z818 Family history of other mental and behavioral disorders: Secondary | ICD-10-CM

## 2019-07-19 DIAGNOSIS — G8929 Other chronic pain: Secondary | ICD-10-CM | POA: Diagnosis present

## 2019-07-19 DIAGNOSIS — I130 Hypertensive heart and chronic kidney disease with heart failure and stage 1 through stage 4 chronic kidney disease, or unspecified chronic kidney disease: Secondary | ICD-10-CM | POA: Diagnosis not present

## 2019-07-19 DIAGNOSIS — E1122 Type 2 diabetes mellitus with diabetic chronic kidney disease: Secondary | ICD-10-CM | POA: Diagnosis present

## 2019-07-19 DIAGNOSIS — I471 Supraventricular tachycardia: Secondary | ICD-10-CM | POA: Diagnosis present

## 2019-07-19 LAB — CBC
HCT: 39.2 % (ref 36.0–46.0)
Hemoglobin: 13.1 g/dL (ref 12.0–15.0)
MCH: 22.2 pg — ABNORMAL LOW (ref 26.0–34.0)
MCHC: 33.4 g/dL (ref 30.0–36.0)
MCV: 66.4 fL — ABNORMAL LOW (ref 80.0–100.0)
Platelets: 290 10*3/uL (ref 150–400)
RBC: 5.9 MIL/uL — ABNORMAL HIGH (ref 3.87–5.11)
RDW: 15.9 % — ABNORMAL HIGH (ref 11.5–15.5)
WBC: 7.7 10*3/uL (ref 4.0–10.5)
nRBC: 0 % (ref 0.0–0.2)

## 2019-07-19 LAB — COMPREHENSIVE METABOLIC PANEL
ALT: 44 U/L (ref 0–44)
AST: 72 U/L — ABNORMAL HIGH (ref 15–41)
Albumin: 2.4 g/dL — ABNORMAL LOW (ref 3.5–5.0)
Alkaline Phosphatase: 345 U/L — ABNORMAL HIGH (ref 38–126)
Anion gap: 12 (ref 5–15)
BUN: 11 mg/dL (ref 8–23)
CO2: 24 mmol/L (ref 22–32)
Calcium: 8.8 mg/dL — ABNORMAL LOW (ref 8.9–10.3)
Chloride: 98 mmol/L (ref 98–111)
Creatinine, Ser: 1.04 mg/dL — ABNORMAL HIGH (ref 0.44–1.00)
GFR calc Af Amer: >60 mL/min (ref >60)
GFR calc non Af Amer: 57 mL/min — ABNORMAL LOW (ref >60)
Glucose, Bld: 234 mg/dL — ABNORMAL HIGH (ref 70–99)
Potassium: 4 mmol/L (ref 3.5–5.1)
Sodium: 134 mmol/L — ABNORMAL LOW (ref 135–145)
Total Bilirubin: 2.3 mg/dL — ABNORMAL HIGH (ref 0.3–1.2)
Total Protein: 6.2 g/dL — ABNORMAL LOW (ref 6.5–8.1)

## 2019-07-19 LAB — LIPASE, BLOOD: Lipase: 25 U/L (ref 11–51)

## 2019-07-19 MED ORDER — ONDANSETRON HCL 4 MG/2ML IJ SOLN
4.0000 mg | Freq: Once | INTRAMUSCULAR | Status: AC | PRN
  Administered 2019-07-19: 4 mg via INTRAVENOUS
  Filled 2019-07-19: qty 2

## 2019-07-19 MED ORDER — SODIUM CHLORIDE 0.9% FLUSH
3.0000 mL | Freq: Once | INTRAVENOUS | Status: AC
  Administered 2019-07-20: 3 mL via INTRAVENOUS

## 2019-07-19 NOTE — ED Triage Notes (Signed)
Pt accompanied by spouse who reports she has not had a BM since last week and abd pain. Nausea, no vomiting.

## 2019-07-20 ENCOUNTER — Emergency Department (HOSPITAL_COMMUNITY): Payer: Medicare HMO

## 2019-07-20 ENCOUNTER — Encounter (HOSPITAL_COMMUNITY): Payer: Self-pay | Admitting: Internal Medicine

## 2019-07-20 ENCOUNTER — Observation Stay (HOSPITAL_COMMUNITY): Payer: Medicare HMO

## 2019-07-20 DIAGNOSIS — Z20828 Contact with and (suspected) exposure to other viral communicable diseases: Secondary | ICD-10-CM | POA: Diagnosis present

## 2019-07-20 DIAGNOSIS — G8929 Other chronic pain: Secondary | ICD-10-CM | POA: Diagnosis present

## 2019-07-20 DIAGNOSIS — R4701 Aphasia: Secondary | ICD-10-CM | POA: Diagnosis present

## 2019-07-20 DIAGNOSIS — E876 Hypokalemia: Secondary | ICD-10-CM | POA: Diagnosis present

## 2019-07-20 DIAGNOSIS — J90 Pleural effusion, not elsewhere classified: Secondary | ICD-10-CM | POA: Diagnosis not present

## 2019-07-20 DIAGNOSIS — D573 Sickle-cell trait: Secondary | ICD-10-CM | POA: Diagnosis present

## 2019-07-20 DIAGNOSIS — E1122 Type 2 diabetes mellitus with diabetic chronic kidney disease: Secondary | ICD-10-CM | POA: Diagnosis present

## 2019-07-20 DIAGNOSIS — Z6821 Body mass index (BMI) 21.0-21.9, adult: Secondary | ICD-10-CM | POA: Diagnosis not present

## 2019-07-20 DIAGNOSIS — J942 Hemothorax: Secondary | ICD-10-CM | POA: Diagnosis not present

## 2019-07-20 DIAGNOSIS — N182 Chronic kidney disease, stage 2 (mild): Secondary | ICD-10-CM | POA: Diagnosis present

## 2019-07-20 DIAGNOSIS — R188 Other ascites: Secondary | ICD-10-CM | POA: Diagnosis present

## 2019-07-20 DIAGNOSIS — I10 Essential (primary) hypertension: Secondary | ICD-10-CM

## 2019-07-20 DIAGNOSIS — K5900 Constipation, unspecified: Secondary | ICD-10-CM

## 2019-07-20 DIAGNOSIS — M170 Bilateral primary osteoarthritis of knee: Secondary | ICD-10-CM | POA: Diagnosis present

## 2019-07-20 DIAGNOSIS — F329 Major depressive disorder, single episode, unspecified: Secondary | ICD-10-CM | POA: Diagnosis present

## 2019-07-20 DIAGNOSIS — I472 Ventricular tachycardia: Secondary | ICD-10-CM | POA: Diagnosis present

## 2019-07-20 DIAGNOSIS — I428 Other cardiomyopathies: Secondary | ICD-10-CM | POA: Diagnosis present

## 2019-07-20 DIAGNOSIS — R112 Nausea with vomiting, unspecified: Secondary | ICD-10-CM

## 2019-07-20 DIAGNOSIS — I471 Supraventricular tachycardia: Secondary | ICD-10-CM | POA: Diagnosis present

## 2019-07-20 DIAGNOSIS — I5023 Acute on chronic systolic (congestive) heart failure: Secondary | ICD-10-CM | POA: Diagnosis not present

## 2019-07-20 DIAGNOSIS — Z66 Do not resuscitate: Secondary | ICD-10-CM | POA: Diagnosis not present

## 2019-07-20 DIAGNOSIS — E872 Acidosis: Secondary | ICD-10-CM | POA: Diagnosis present

## 2019-07-20 DIAGNOSIS — I509 Heart failure, unspecified: Secondary | ICD-10-CM

## 2019-07-20 DIAGNOSIS — I469 Cardiac arrest, cause unspecified: Secondary | ICD-10-CM | POA: Diagnosis not present

## 2019-07-20 DIAGNOSIS — I4729 Other ventricular tachycardia: Secondary | ICD-10-CM

## 2019-07-20 DIAGNOSIS — I236 Thrombosis of atrium, auricular appendage, and ventricle as current complications following acute myocardial infarction: Secondary | ICD-10-CM | POA: Diagnosis not present

## 2019-07-20 DIAGNOSIS — I5043 Acute on chronic combined systolic (congestive) and diastolic (congestive) heart failure: Secondary | ICD-10-CM | POA: Diagnosis present

## 2019-07-20 DIAGNOSIS — J9601 Acute respiratory failure with hypoxia: Secondary | ICD-10-CM | POA: Diagnosis present

## 2019-07-20 DIAGNOSIS — E440 Moderate protein-calorie malnutrition: Secondary | ICD-10-CM | POA: Diagnosis present

## 2019-07-20 DIAGNOSIS — I493 Ventricular premature depolarization: Secondary | ICD-10-CM | POA: Diagnosis present

## 2019-07-20 DIAGNOSIS — G3184 Mild cognitive impairment, so stated: Secondary | ICD-10-CM | POA: Diagnosis present

## 2019-07-20 DIAGNOSIS — E1159 Type 2 diabetes mellitus with other circulatory complications: Secondary | ICD-10-CM

## 2019-07-20 DIAGNOSIS — N170 Acute kidney failure with tubular necrosis: Secondary | ICD-10-CM | POA: Diagnosis present

## 2019-07-20 DIAGNOSIS — I130 Hypertensive heart and chronic kidney disease with heart failure and stage 1 through stage 4 chronic kidney disease, or unspecified chronic kidney disease: Secondary | ICD-10-CM | POA: Diagnosis present

## 2019-07-20 DIAGNOSIS — R570 Cardiogenic shock: Secondary | ICD-10-CM | POA: Diagnosis not present

## 2019-07-20 LAB — COMPREHENSIVE METABOLIC PANEL
ALT: 39 U/L (ref 0–44)
AST: 70 U/L — ABNORMAL HIGH (ref 15–41)
Albumin: 2.1 g/dL — ABNORMAL LOW (ref 3.5–5.0)
Alkaline Phosphatase: 317 U/L — ABNORMAL HIGH (ref 38–126)
Anion gap: 9 (ref 5–15)
BUN: 14 mg/dL (ref 8–23)
CO2: 27 mmol/L (ref 22–32)
Calcium: 8.1 mg/dL — ABNORMAL LOW (ref 8.9–10.3)
Chloride: 101 mmol/L (ref 98–111)
Creatinine, Ser: 1.21 mg/dL — ABNORMAL HIGH (ref 0.44–1.00)
GFR calc Af Amer: 55 mL/min — ABNORMAL LOW (ref >60)
GFR calc non Af Amer: 47 mL/min — ABNORMAL LOW (ref >60)
Glucose, Bld: 195 mg/dL — ABNORMAL HIGH (ref 70–99)
Potassium: 4 mmol/L (ref 3.5–5.1)
Sodium: 137 mmol/L (ref 135–145)
Total Bilirubin: 2.3 mg/dL — ABNORMAL HIGH (ref 0.3–1.2)
Total Protein: 5.8 g/dL — ABNORMAL LOW (ref 6.5–8.1)

## 2019-07-20 LAB — CBC WITH DIFFERENTIAL/PLATELET
Abs Immature Granulocytes: 0.03 10*3/uL (ref 0.00–0.07)
Basophils Absolute: 0.1 10*3/uL (ref 0.0–0.1)
Basophils Relative: 1 %
Eosinophils Absolute: 0 10*3/uL (ref 0.0–0.5)
Eosinophils Relative: 0 %
HCT: 36.4 % (ref 36.0–46.0)
Hemoglobin: 12.1 g/dL (ref 12.0–15.0)
Immature Granulocytes: 0 %
Lymphocytes Relative: 10 %
Lymphs Abs: 0.7 10*3/uL (ref 0.7–4.0)
MCH: 22.3 pg — ABNORMAL LOW (ref 26.0–34.0)
MCHC: 33.2 g/dL (ref 30.0–36.0)
MCV: 67 fL — ABNORMAL LOW (ref 80.0–100.0)
Monocytes Absolute: 0.7 10*3/uL (ref 0.1–1.0)
Monocytes Relative: 10 %
Neutro Abs: 5.7 10*3/uL (ref 1.7–7.7)
Neutrophils Relative %: 79 %
Platelets: 273 10*3/uL (ref 150–400)
RBC: 5.43 MIL/uL — ABNORMAL HIGH (ref 3.87–5.11)
RDW: 15.8 % — ABNORMAL HIGH (ref 11.5–15.5)
WBC: 7.2 10*3/uL (ref 4.0–10.5)
nRBC: 0 % (ref 0.0–0.2)

## 2019-07-20 LAB — TROPONIN I (HIGH SENSITIVITY)
Troponin I (High Sensitivity): 27 ng/L — ABNORMAL HIGH (ref <18)
Troponin I (High Sensitivity): 27 ng/L — ABNORMAL HIGH (ref <18)

## 2019-07-20 LAB — PROTIME-INR
INR: 1.2 (ref 0.8–1.2)
Prothrombin Time: 14.6 seconds (ref 11.4–15.2)

## 2019-07-20 LAB — GLUCOSE, CAPILLARY
Glucose-Capillary: 131 mg/dL — ABNORMAL HIGH (ref 70–99)
Glucose-Capillary: 119 mg/dL — ABNORMAL HIGH (ref 70–99)

## 2019-07-20 LAB — MAGNESIUM
Magnesium: 1.7 mg/dL (ref 1.7–2.4)
Magnesium: 1.6 mg/dL — ABNORMAL LOW (ref 1.7–2.4)

## 2019-07-20 LAB — HEPARIN LEVEL (UNFRACTIONATED)
Heparin Unfractionated: 0.54 IU/mL (ref 0.30–0.70)
Heparin Unfractionated: 0.82 IU/mL — ABNORMAL HIGH (ref 0.30–0.70)

## 2019-07-20 LAB — TSH: TSH: 5.06 u[IU]/mL — ABNORMAL HIGH (ref 0.350–4.500)

## 2019-07-20 LAB — CBG MONITORING, ED: Glucose-Capillary: 163 mg/dL — ABNORMAL HIGH (ref 70–99)

## 2019-07-20 LAB — BRAIN NATRIURETIC PEPTIDE: B Natriuretic Peptide: 378.1 pg/mL — ABNORMAL HIGH (ref 0.0–100.0)

## 2019-07-20 LAB — SARS CORONAVIRUS 2 (TAT 6-24 HRS): SARS Coronavirus 2: NEGATIVE

## 2019-07-20 MED ORDER — POTASSIUM CHLORIDE CRYS ER 20 MEQ PO TBCR
20.0000 meq | EXTENDED_RELEASE_TABLET | Freq: Every day | ORAL | Status: DC
  Filled 2019-07-20 (×2): qty 1

## 2019-07-20 MED ORDER — BISACODYL 10 MG RE SUPP
10.0000 mg | Freq: Every day | RECTAL | Status: DC | PRN
  Administered 2019-07-20: 21:00:00 10 mg via RECTAL
  Filled 2019-07-20: qty 1

## 2019-07-20 MED ORDER — FLEET ENEMA 7-19 GM/118ML RE ENEM
1.0000 | ENEMA | RECTAL | Status: DC | PRN
  Filled 2019-07-20: qty 1

## 2019-07-20 MED ORDER — ASPIRIN EC 81 MG PO TBEC
81.0000 mg | DELAYED_RELEASE_TABLET | Freq: Every day | ORAL | Status: DC
  Administered 2019-07-20: 81 mg via ORAL
  Filled 2019-07-20: qty 1

## 2019-07-20 MED ORDER — MAGNESIUM SULFATE 2 GM/50ML IV SOLN
2.0000 g | Freq: Once | INTRAVENOUS | Status: AC
  Administered 2019-07-20: 07:00:00 2 g via INTRAVENOUS
  Filled 2019-07-20: qty 50

## 2019-07-20 MED ORDER — METOPROLOL SUCCINATE ER 50 MG PO TB24
100.0000 mg | ORAL_TABLET | Freq: Every morning | ORAL | Status: DC
  Administered 2019-07-20 (×2): 100 mg via ORAL
  Filled 2019-07-20 (×2): qty 1

## 2019-07-20 MED ORDER — INSULIN ASPART 100 UNIT/ML ~~LOC~~ SOLN
0.0000 [IU] | Freq: Three times a day (TID) | SUBCUTANEOUS | Status: DC
  Administered 2019-07-20: 2 [IU] via SUBCUTANEOUS
  Administered 2019-07-20: 1 [IU] via SUBCUTANEOUS

## 2019-07-20 MED ORDER — SUCRALFATE 1 GM/10ML PO SUSP: 1.0000 g | Freq: Three times a day (TID) | ORAL | Status: DC

## 2019-07-20 MED ORDER — METOPROLOL SUCCINATE ER 25 MG PO TB24: 25.0000 mg | ORAL_TABLET | Freq: Every evening | ORAL | Status: DC

## 2019-07-20 MED ORDER — POLYETHYLENE GLYCOL 3350 17 G PO PACK: 17.0000 g | PACK | Freq: Every day | ORAL | Status: DC | PRN

## 2019-07-20 MED ORDER — SACUBITRIL-VALSARTAN 24-26 MG PO TABS
1.0000 | ORAL_TABLET | Freq: Two times a day (BID) | ORAL | Status: DC
  Administered 2019-07-20: 21:00:00 1 via ORAL
  Filled 2019-07-20 (×4): qty 1

## 2019-07-20 MED ORDER — METOPROLOL TARTRATE 5 MG/5ML IV SOLN
5.0000 mg | Freq: Once | INTRAVENOUS | Status: AC
  Administered 2019-07-20: 5 mg via INTRAVENOUS
  Filled 2019-07-20: qty 5

## 2019-07-20 MED ORDER — IVABRADINE HCL 5 MG PO TABS
5.0000 mg | ORAL_TABLET | Freq: Every day | ORAL | Status: DC
  Administered 2019-07-20: 11:00:00 5 mg via ORAL
  Filled 2019-07-20 (×2): qty 1

## 2019-07-20 MED ORDER — BISACODYL 5 MG PO TBEC: 5.0000 mg | DELAYED_RELEASE_TABLET | Freq: Every day | ORAL | Status: DC

## 2019-07-20 MED ORDER — ACETAMINOPHEN 650 MG RE SUPP: 650.0000 mg | Freq: Four times a day (QID) | RECTAL | Status: DC | PRN

## 2019-07-20 MED ORDER — INSULIN GLARGINE 100 UNITS/ML SOLOSTAR PEN: 15.0000 [IU] | PEN_INJECTOR | Freq: Every day | SUBCUTANEOUS | Status: DC

## 2019-07-20 MED ORDER — INSULIN GLARGINE 100 UNIT/ML ~~LOC~~ SOLN
15.0000 [IU] | Freq: Every day | SUBCUTANEOUS | Status: DC
  Administered 2019-07-20: 21:00:00 15 [IU] via SUBCUTANEOUS
  Filled 2019-07-20 (×2): qty 0.15

## 2019-07-20 MED ORDER — ACETAMINOPHEN 325 MG PO TABS: 650.0000 mg | ORAL_TABLET | Freq: Four times a day (QID) | ORAL | Status: DC | PRN

## 2019-07-20 MED ORDER — HEPARIN (PORCINE) 25000 UT/250ML-% IV SOLN
700.0000 [IU]/h | INTRAVENOUS | Status: DC
  Administered 2019-07-20: 800 [IU]/h via INTRAVENOUS
  Filled 2019-07-20: qty 250

## 2019-07-20 MED ORDER — FUROSEMIDE 10 MG/ML IJ SOLN
40.0000 mg | Freq: Two times a day (BID) | INTRAMUSCULAR | Status: DC
  Administered 2019-07-20 (×2): 40 mg via INTRAVENOUS
  Filled 2019-07-20 (×2): qty 4

## 2019-07-20 MED ORDER — METOPROLOL SUCCINATE ER 50 MG PO TB24
50.0000 mg | ORAL_TABLET | Freq: Every evening | ORAL | Status: DC
  Administered 2019-07-20: 50 mg via ORAL
  Filled 2019-07-20: qty 2

## 2019-07-20 NOTE — Progress Notes (Signed)
Patient admitted; noted to be oriented to self only.  Husband at bedside to assist w/health hx and care.  Anytime husband even mentions leaving, patient starts crying hysterically and clings to him physically.  Permission granted to have husband remain at bedside d/t patient's cognitive deficits.  Tele indicates intermittent runs of 2-3 beats of NSVT at a time; pt remains asymptomatic; VSS.  Baseline ST in low 100s.

## 2019-07-20 NOTE — H&P (Signed)
History and Physical    Kristen Lozano FTD:[id number] DOB: 08-19-1956 DOA: 07/20/2019  PCP: Audley Hose, MD  Patient coming from: Home.  Chief Complaint: Constipation.  HPI: Kristen Lozano is a 63 y.o. female with history of systolic heart failure last EF measured was 20-25% in January 2020, history of stroke, hypertension, diabetes mellitus, sickle cell trait presents to the ER because of increasing constipation over the last 3 days.  Had some episodes of nausea vomiting.  Denies any abdominal pain.  Denies any chest pain or shortness of breath and has been compliant with the Lasix as per the family.  ED Course: In the ER patient was found to have brief runs of sinus tachycardia with nonsustained V. tach.  On-call cardiologist was consulted at this time recommended to closely follow metabolic panel.  Chest x-ray shows worsening bilateral pleural effusion more on the right side.  KUB was unremarkable.  Patient has been admitted for further management of sinus tachycardia with nonsustained V. tach in the setting of cardiomyopathy with constipation.  Review of Systems: As per HPI, rest all negative.   Past Medical History:  Diagnosis Date   Acute combined systolic and diastolic HF (heart failure) (HCC) 10/16/2018   Allergic rhinitis    Requires cetirizine, singulair, and fluticasone.   Anxiety    Has been on Xanax since 2009. Uses it for stress, anxiety, and insomnia. No contract yet.   Arthritis    CHF (congestive heart failure) (HCC)    EF 35% after stroke, presumed ischemic   Chronic pain    Has OA of knees B. No Xrays in echart. Not requiring narcotics.   Colitis 12/2017   Depression    Diabetes mellitus    Type 2, non insulin dependent. Was dx'd prior to 2008.   Hyperglycemia    Hyperlipemia    Hypertension    Sickle cell trait ([place])    Stroke ([place]) 09/05/14   Dominant left MCA infarcts secondary to unknown embolic source     Past Surgical History:    Procedure Laterality Date   BIOPSY  09/23/2018   Procedure: BIOPSY;  Surgeon: Yetta Flock, MD;  Location: [facility];  Service: Gastroenterology;;   ESOPHAGOGASTRODUODENOSCOPY (EGD) WITH PROPOFOL N/A 09/23/2018   Procedure: ESOPHAGOGASTRODUODENOSCOPY (EGD) WITH PROPOFOL;  Surgeon: Yetta Flock, MD;  Location: [facility];  Service: Gastroenterology;  Laterality: N/A;   RIGHT/LEFT HEART CATH AND CORONARY ANGIOGRAPHY N/A 10/14/2018   Procedure: RIGHT/LEFT HEART CATH AND CORONARY ANGIOGRAPHY;  Surgeon: Troy Sine, MD;  Location: [facility];  Service: Cardiovascular;  Laterality: N/A;   TEE WITHOUT CARDIOVERSION N/A 09/06/2014   Procedure: TRANSESOPHAGEAL ECHOCARDIOGRAM (TEE);  Surgeon: Pixie Casino, MD;  Location: [facility] ENDOSCOPY;  Service: Cardiovascular;  Laterality: N/A;     reports that she has never smoked. She has never used smokeless tobacco. She reports that she does not drink alcohol or use drugs.  Allergies  Allergen Reactions   Metformin And Related Nausea And Vomiting   Lipitor [Atorvastatin] Other (See Comments)    Stomach pains    Family History  Problem Relation Age of Onset   Diabetes Mother    Hypertension Mother    Heart disease Father    Heart attack Father    Hypertension Father    Diabetes Father    Ovarian cancer Sister    Liver cancer Sister    Sickle cell anemia Daughter    Schizophrenia Daughter    Hypertension Sister  Hypertension Brother    Hypertension Daughter    Kidney disease Sister        x2   Kidney disease Brother    Stroke Neg Hx    Esophageal cancer Neg Hx    Colon cancer Neg Hx    Colon polyps Neg Hx     Prior to Admission medications   Medication Sig Start Date End Date Taking? Authorizing Provider  acetaminophen (TYLENOL) 500 MG tablet Take 500 mg by mouth every 6 (six) hours as needed for mild pain or headache.   Yes [provider]  ASPIRIN LOW DOSE 81 MG EC  tablet Take 1 tablet (81 mg total) by mouth daily. Patient taking differently: Take 81 mg by mouth daily.  01/22/18  Yes Hilty, Nadean Corwin, MD  Docusate Sodium (CORRECTOL EXTRA GENTLE PO) Take 1 capsule by mouth daily.   Yes [provider]  furosemide (LASIX) 40 MG tablet Take 1 tablet (40 mg total) by mouth 2 (two) times daily. Take the second dose around 3 PM 06/13/19  Yes Bonnell Public, MD  guaiFENesin (ROBITUSSIN) 100 MG/5ML SOLN Take 15 mLs (300 mg total) by mouth every 6 (six) hours as needed for cough. 06/13/19  Yes Bonnell Public, MD  hydrocortisone (ANUSOL-HC) 2.5 % rectal cream Apply 1 application topically 4 (four) times daily as needed for hemorrhoids. 06/13/19  Yes Dana Allan I, MD  insulin glargine (LANTUS) 100 unit/mL SOPN Inject 15 Units into the skin at bedtime.   Yes [provider]  ivabradine (CORLANOR) 5 MG TABS tablet Take 1 tablet (5 mg total) by mouth 2 (two) times daily with a meal. Patient taking differently: Take 5 mg by mouth daily.  12/22/18  Yes Eugenie Filler, MD  loratadine (CLARITIN) 10 MG tablet Take 1 tablet (10 mg total) by mouth daily as needed for allergies. 06/13/19  Yes Dana Allan I, MD  metoprolol succinate (TOPROL-XL) 100 MG 24 hr tablet Take 1 tablet (100 mg total) by mouth every morning. Take with or immediately following a meal. In the Morning Patient taking differently: Take 100 mg by mouth every morning. Take with or immediately following a meal.  In the Morning 05/07/19  Yes Hilty, Nadean Corwin, MD  metoprolol succinate (TOPROL-XL) 50 MG 24 hr tablet Take 1 tablet (50 mg total) by mouth at bedtime. Take with or immediately following a meal. <PLEASE MAKE APPOINTMENT FOR REFILLS> Patient taking differently: Take 25 mg by mouth every evening. Take with or immediately following a meal. <PLEASE MAKE APPOINTMENT FOR REFILLS> 05/07/19  Yes Hilty, Nadean Corwin, MD  pantoprazole (PROTONIX) 40 MG tablet Take 1 tablet (40 mg total)  by mouth daily with supper. Patient taking differently: Take 40 mg by mouth daily as needed (indigestion).  06/04/19  Yes Hilty, Nadean Corwin, MD  polyethylene glycol (MIRALAX / GLYCOLAX) packet Take 17 g by mouth daily as needed. Patient taking differently: Take 17 g by mouth daily as needed for mild constipation.  12/22/18  Yes Eugenie Filler, MD  polyvinyl alcohol (LIQUIFILM TEARS) 1.4 % ophthalmic solution Place 1 drop into both eyes as needed for dry eyes. 06/13/19  Yes Dana Allan I, MD  potassium chloride (K-DUR) 10 MEQ tablet Take 1 tablet (10 mEq total) by mouth daily. Patient taking differently: Take 5 mEq by mouth daily.  09/02/18  Yes Georgette Shell, MD  sacubitril-valsartan (ENTRESTO) 49-51 MG Take 1 tablet by mouth 2 (two) times daily. 06/21/19  Yes Hilty, Nadean Corwin, MD  warfarin (COUMADIN) 3 MG tablet Take 1 tablet (3 mg total) by mouth one time only at 6 PM. 06/13/19 07/20/19 Yes Ogbata, Babs Bertin, MD  Acetaminophen 500 MG coapsule Take 1 capsule (500 mg total) by mouth daily as needed. Patient not taking: Reported on 07/20/2019 06/13/19   Dana Allan I, MD  docusate sodium (COLACE) 100 MG capsule Take 1 capsule (100 mg total) by mouth 2 (two) times daily as needed for mild constipation. Patient not taking: Reported on 07/20/2019 10/16/18   Isaiah Serge, NP  phenol (CHLORASEPTIC) 1.4 % LIQD Use as directed 1 spray in the mouth or throat as needed for throat irritation / pain. Patient not taking: Reported on 07/20/2019 06/13/19   Dana Allan I, MD  sucralfate (CARAFATE) 1 GM/10ML suspension Take 10 mLs (1 g total) by mouth 4 (four) times daily -  with meals and at bedtime for 10 days. Patient not taking: Reported on 06/12/2019 10/19/18 12/30/18  Long, Wonda Olds, MD    Physical Exam: Constitutional: Moderately built and nourished. Vitals:   07/20/19 0200 07/20/19 0315 07/20/19 0330 07/20/19 0345  BP: 115/79 127/90 133/79 126/72  Pulse: (!) 110     Resp: 20 20 (!)  21 (!) 28  Temp:      TempSrc:      SpO2: 96%      Eyes: Anicteric no pallor. ENMT: No discharge from the ears eyes nose and mouth. Neck: No mass felt.  No neck rigidity. Respiratory: No rhonchi or crepitations. Cardiovascular: S1-S2 heard. Abdomen: Soft nontender bowel sounds present. Musculoskeletal: No edema. Skin: No rash. Neurologic: Alert awake oriented to time place and person.  Moves all extremities. Psychiatric: Appears normal.   Labs on Admission: I have personally reviewed following labs and imaging studies  CBC: Recent Labs  Lab 06/30/2019 1833  WBC 7.7  HGB 13.1  HCT 39.2  MCV 66.4*  PLT 086   Basic Metabolic Panel: Recent Labs  Lab 07/18/2019 1833 07/20/19 0131  NA 134*  --   K 4.0  --   CL 98  --   CO2 24  --   GLUCOSE 234*  --   BUN 11  --   CREATININE 1.04*  --   CALCIUM 8.8*  --   MG  --  1.7   GFR: CrCl cannot be calculated (Unknown ideal weight.). Liver Function Tests: Recent Labs  Lab 07/10/2019 1833  AST 72*  ALT 44  ALKPHOS 345*  BILITOT 2.3*  PROT 6.2*  ALBUMIN 2.4*   Recent Labs  Lab 07/10/2019 1833  LIPASE 25   No results for input(s): AMMONIA in the last 168 hours. Coagulation Profile: No results for input(s): INR, PROTIME in the last 168 hours. Cardiac Enzymes: No results for input(s): CKTOTAL, CKMB, CKMBINDEX, TROPONINI in the last 168 hours. BNP (last 3 results) No results for input(s): PROBNP in the last 8760 hours. HbA1C: No results for input(s): HGBA1C in the last 72 hours. CBG: No results for input(s): GLUCAP in the last 168 hours. Lipid Profile: No results for input(s): CHOL, HDL, LDLCALC, TRIG, CHOLHDL, LDLDIRECT in the last 72 hours. Thyroid Function Tests: No results for input(s): TSH, T4TOTAL, FREET4, T3FREE, THYROIDAB in the last 72 hours. Anemia Panel: No results for input(s): VITAMINB12, FOLATE, FERRITIN, TIBC, IRON, RETICCTPCT in the last 72 hours. Urine analysis:    Component Value Date/Time    COLORURINE YELLOW 12/30/2018 1910   APPEARANCEUR CLEAR 12/30/2018 1910   LABSPEC 1.011 12/30/2018 1910   PHURINE 6.0  12/30/2018 1910   GLUCOSEU >=500 (A) 12/30/2018 1910   HGBUR NEGATIVE 12/30/2018 1910   BILIRUBINUR NEGATIVE 12/30/2018 1910   KETONESUR NEGATIVE 12/30/2018 1910   PROTEINUR >=300 (A) 12/30/2018 1910   UROBILINOGEN 1.0 09/07/2014 1405   NITRITE NEGATIVE 12/30/2018 1910   LEUKOCYTESUR NEGATIVE 12/30/2018 1910   Sepsis Labs: @LABRCNTIP (procalcitonin:4,lacticidven:4) )No results found for this or any previous visit (from the past 240 hour(s)).   Radiological Exams on Admission: Dg Chest Port 1 View  Result Date: 07/20/2019 CLINICAL DATA:  Abdominal pain with nausea EXAM: PORTABLE CHEST 1 VIEW COMPARISON:  06/10/2019 FINDINGS: Large right-sided pleural effusion. Small moderate left-sided pleural effusion. Findings on the right have significantly increased compared to prior. Cardiomediastinal silhouette obscured. Vascular congestion and underlying pulmonary edema. Bibasilar consolidations. IMPRESSION: 1. Large right-sided pleural effusion, increased compared to prior. Small moderate left pleural effusion, grossly similar 2. Probable enlargement of the cardiomediastinal silhouette. Vascular congestion with underlying edema. Dense basilar consolidations. Electronically Signed   By: Donavan Foil M.D.   On: 07/20/2019 01:53   Dg Abd Portable 2 Views  Result Date: 07/20/2019 CLINICAL DATA:  Abdominal pain EXAM: PORTABLE ABDOMEN - 2 VIEW COMPARISON:  12/26/2018 FINDINGS: Large right-sided pleural effusion. Small moderate left pleural effusion. No gross free air beneath the diaphragm. Nonspecific diffuse decreased bowel gas with minimal gas in the stomach and left lower quadrant. No radiopaque calculi. IMPRESSION: 1. Large right pleural effusion and small moderate left pleural effusion 2. Nonspecific diffuse decreased bowel gas. Electronically Signed   By: Donavan Foil M.D.   On:  07/20/2019 02:29    EKG: Independently reviewed.  Sinus tachycardia with nonsustained V. tach.  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 Nonsustained V. tach with periods of sinus tachycardia in setting of cardiomyopathy last EF measured was 20 to 25% -cardiology has been consulted.  Patient is on metoprolol.  Was given 1 dose of IV metoprolol in the ER.  Closely monitor in telemetry follow metabolic panel magnesium levels and cardiology has been consulted.  Patient may have missed metoprolol dose because of vomiting. 2. Worsening bilateral pleural effusion more on the right side with history of cardiomyopathy -we will keep patient on IV Lasix.  If does not improve may need thoracentesis.  Patient is on Entresto metoprolol. 3. Constipation -no definite evidence of any stool burden in the x-ray.  Closely observe.  May need an MRI if patient still has constipation. 4. History of stroke with LV thrombus on Coumadin.  Since patient may need thoracentesis we will keep patient on heparin and hold Coumadin. 5. Diabetes mellitus type 2 on Lantus.  Follow sliding scale coverage. 6. Chronic kidney disease stage II closely follow metabolic panel.  On IV Lasix Entresto.   DVT prophylaxis: Heparin. Code Status: Full code. Family Communication: Patient Lozano. Disposition Plan: Home. Consults called: Cardiology. Admission status: Observation   Rise Patience MD [facility] Pager [phone].  If 7PM-7AM, please contact night-coverage [email or web address] Password [email or web address]  07/20/2019, 4:09 AM

## 2019-07-20 NOTE — Consult Note (Signed)
Advanced Heart Failure Team Consult Note   Primary Physician: Audley Hose, MD PCP-Cardiologist:  Pixie Casino, MD  Reason for Consultation: CHF  HPI:    Kristen Lozano is seen today for evaluation of CHF at the request of Dr. Hal Hope.   63 yo with history of nonischemic cardiomyopathy, LV thrombus/CVA, diabetes, HTN was admitted with bilateral pleural effusions.  Patient has a long history of cardiomyopathy, first noted to have EF 35-40% on echo in 10/15 at time of admission with CVA.  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  Echoes in 9/19 and 1/20 showed EF 20-25%.  LHC/RHC in 12/19 showed no significant CAD and low cardiac output.   Patient was last admitted with a CHF exacerbation in 7/20.  Husband helps here with history, she has difficulty with word-finding that is a residual from her prior CVA.  2-3 weeks ago, she had a lot of swelling in her legs, but this has actually gone down.  She notes chronic dyspnea with moderate exertion like walking up stairs/inclines.  No dyspnea generally walking around her house.  Mild orthopnea, sleeps on a couple pillows.  She can to the ER because of abdominal distention and constipation, not because of shortness of breath.  CXR then showed large right and small-moderate left pleural effusions.   She was also noted to have runs of atrial tachycardia and short NSVT while in the ER.  She was admitted for management of pleural effusions and arrhythmias.    Review of Systems: All systems reviewed and negative except as per HPI.   Home Medications Prior to Admission medications   Medication Sig Start Date End Date Taking? Authorizing Provider  acetaminophen (TYLENOL) 500 MG tablet Take 500 mg by mouth every 6 (six) hours as needed for mild pain or headache.   Yes [provider]  ASPIRIN LOW DOSE 81 MG EC tablet Take 1 tablet (81 mg total) by mouth daily. Patient  taking differently: Take 81 mg by mouth daily.  01/22/18  Yes Hilty, Nadean Corwin, MD  Docusate Sodium (CORRECTOL EXTRA GENTLE PO) Take 1 capsule by mouth daily.   Yes [provider]  furosemide (LASIX) 40 MG tablet Take 1 tablet (40 mg total) by mouth 2 (two) times daily. Take the second dose around 3 PM 06/13/19  Yes Bonnell Public, MD  guaiFENesin (ROBITUSSIN) 100 MG/5ML SOLN Take 15 mLs (300 mg total) by mouth every 6 (six) hours as needed for cough. 06/13/19  Yes Bonnell Public, MD  hydrocortisone (ANUSOL-HC) 2.5 % rectal cream Apply 1 application topically 4 (four) times daily as needed for hemorrhoids. 06/13/19  Yes Dana Allan I, MD  insulin glargine (LANTUS) 100 unit/mL SOPN Inject 15 Units into the skin at bedtime.   Yes [provider]  ivabradine (CORLANOR) 5 MG TABS tablet Take 1 tablet (5 mg total) by mouth 2 (two) times daily with a meal. Patient taking differently: Take 5 mg by mouth daily.  12/22/18  Yes Eugenie Filler, MD  loratadine (CLARITIN) 10 MG tablet Take 1 tablet (10 mg total) by mouth daily as needed for allergies. 06/13/19  Yes Dana Allan I, MD  metoprolol succinate (TOPROL-XL) 100 MG 24 hr tablet Take 1 tablet (100 mg total) by mouth every morning. Take with or immediately following a meal. In the Morning Patient taking differently: Take 100 mg by mouth every morning. Take with or immediately following a  meal.  In the Morning 05/07/19  Yes Hilty, Nadean Corwin, MD  metoprolol succinate (TOPROL-XL) 50 MG 24 hr tablet Take 1 tablet (50 mg total) by mouth at bedtime. Take with or immediately following a meal. <PLEASE MAKE APPOINTMENT FOR REFILLS> Patient taking differently: Take 25 mg by mouth every evening. Take with or immediately following a meal. <PLEASE MAKE APPOINTMENT FOR REFILLS> 05/07/19  Yes Hilty, Nadean Corwin, MD  pantoprazole (PROTONIX) 40 MG tablet Take 1 tablet (40 mg total) by mouth daily with supper. Patient taking differently: Take  40 mg by mouth daily as needed (indigestion).  06/04/19  Yes Hilty, Nadean Corwin, MD  polyethylene glycol (MIRALAX / GLYCOLAX) packet Take 17 g by mouth daily as needed. Patient taking differently: Take 17 g by mouth daily as needed for mild constipation.  12/22/18  Yes Eugenie Filler, MD  polyvinyl alcohol (LIQUIFILM TEARS) 1.4 % ophthalmic solution Place 1 drop into both eyes as needed for dry eyes. 06/13/19  Yes Dana Allan I, MD  potassium chloride (K-DUR) 10 MEQ tablet Take 1 tablet (10 mEq total) by mouth daily. Patient taking differently: Take 5 mEq by mouth daily.  09/02/18  Yes Georgette Shell, MD  sacubitril-valsartan (ENTRESTO) 49-51 MG Take 1 tablet by mouth 2 (two) times daily. 06/21/19  Yes Hilty, Nadean Corwin, MD  warfarin (COUMADIN) 3 MG tablet Take 1 tablet (3 mg total) by mouth one time only at 6 PM. 06/13/19 07/20/19 Yes Ogbata, Babs Bertin, MD  Acetaminophen 500 MG coapsule Take 1 capsule (500 mg total) by mouth daily as needed. Patient not taking: Reported on 07/20/2019 06/13/19   Dana Allan I, MD  docusate sodium (COLACE) 100 MG capsule Take 1 capsule (100 mg total) by mouth 2 (two) times daily as needed for mild constipation. Patient not taking: Reported on 07/20/2019 10/16/18   Isaiah Serge, NP  phenol (CHLORASEPTIC) 1.4 % LIQD Use as directed 1 spray in the mouth or throat as needed for throat irritation / pain. Patient not taking: Reported on 07/20/2019 06/13/19   Dana Allan I, MD  sucralfate (CARAFATE) 1 GM/10ML suspension Take 10 mLs (1 g total) by mouth 4 (four) times daily -  with meals and at bedtime for 10 days. Patient not taking: Reported on 06/12/2019 10/19/18 12/30/18  Long, Wonda Olds, MD    Past Medical History: 1. Chronic systolic CHF: Nonischemic cardiomyopathy.   - Echo (10/15): EF 35-40%.  - Echo (12/17): EF 40-45% with suspected apical thrombus.  - Echo (9/19): EF 20-25% - Echo (2/20): EF 20-25%, diffuse hypokinesis, moderate MR.  - LHC/RHC  (11/19): No significant CAD; mean RA 7, PA 41/18, mean PCWP 17, CI 2.1 (Fick) and 1.6 (thermo).  2. H/o LV thrombus: On warfarin chronically.  3. Type 2 diabetes 4. Depression 5. 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  6. Sickle cell trait 7. HTN 8. Hyperlipidemia.   Past Surgical History: Past Surgical History:  Procedure Laterality Date   BIOPSY  09/23/2018   Procedure: BIOPSY;  Surgeon: Yetta Flock, MD;  Location: [facility];  Service: Gastroenterology;;   ESOPHAGOGASTRODUODENOSCOPY (EGD) WITH PROPOFOL N/A 09/23/2018   Procedure: ESOPHAGOGASTRODUODENOSCOPY (EGD) WITH PROPOFOL;  Surgeon: Yetta Flock, MD;  Location: [facility];  Service: Gastroenterology;  Laterality: N/A;   RIGHT/LEFT HEART CATH AND CORONARY ANGIOGRAPHY N/A 10/14/2018   Procedure: RIGHT/LEFT HEART CATH AND CORONARY ANGIOGRAPHY;  Surgeon: Troy Sine, MD;  Location: [facility];  Service: Cardiovascular;  Laterality: N/A;   TEE WITHOUT CARDIOVERSION N/A 09/06/2014   Procedure: TRANSESOPHAGEAL ECHOCARDIOGRAM (TEE);  Surgeon: Pixie Casino, MD;  Location: [facility];  Service: Cardiovascular;  Laterality: N/A;    Family History: Family History  Problem Relation Age of Onset   Diabetes Mother    Hypertension Mother    Heart disease Father    Heart attack Father    Hypertension Father    Diabetes Father    Ovarian cancer Sister    Liver cancer Sister    Sickle cell anemia Daughter    Schizophrenia Daughter    Hypertension Sister    Hypertension Brother    Hypertension Daughter    Kidney disease Sister        x2   Kidney disease Brother    Stroke Neg Hx    Esophageal cancer Neg Hx    Colon cancer Neg Hx    Colon polyps Neg Hx     Social History: Social History   Socioeconomic History   Marital status: Married    Spouse name: Not on file   Number of children: 2   Years of education: 9 TH   Highest education  level: Not on file  Occupational History   Occupation: Disabled  Scientist, product/process development strain: Not on file   Food insecurity    Worry: Not on file    Inability: Not on file   Transportation needs    Medical: Not on file    Non-medical: Not on file  Tobacco Use   Smoking status: Never Smoker   Smokeless tobacco: Never Used  Substance and Sexual Activity   Alcohol use: No    Alcohol/week: 0.0 standard drinks   Drug use: No   Sexual activity: Not Currently  Lifestyle   Physical activity    Days per week: Not on file    Minutes per session: Not on file   Stress: Not on file  Relationships   Social connections    Talks on phone: Not on file    Gets together: Not on file    Attends religious service: Not on file    Active member of club or organization: Not on file    Attends meetings of clubs or organizations: Not on file    Relationship status: Not on file  Other Topics Concern   Not on file  Social History Narrative   Patient is married with 2 children.   Patient is right handed.   Patient has 9 th grade education.   Patient drinks 2 cups daily.    Allergies:  Allergies  Allergen Reactions   Metformin And Related Nausea And Vomiting   Lipitor [Atorvastatin] Other (See Comments)    Stomach pains    Objective:    Vital Signs:   Temp:  [98 F (36.7 C)-98.5 F (36.9 C)] 98 F (36.7 C) (08/24 2225) Pulse Rate:  [63-155] 101 (08/25 0800) Resp:  [14-29] 20 (08/25 1030) BP: (103-172)/(67-92) 120/67 (08/25 1030) SpO2:  [89 %-100 %] 94 % (08/25 0800) Weight:  [56.4 kg] 56.4 kg (08/25 0400)    Weight change: Filed Weights   07/20/19 0400  Weight: 56.4 kg    Intake/Output:  No intake or output data in the 24 hours ending 07/20/19 1104    Physical Exam    General:  Well appearing. No resp difficulty HEENT: normal Neck: supple. JVP does not appear significantly elevated. Carotids 2+ bilat; no bruits. No lymphadenopathy or  thyromegaly appreciated. Cor: PMI  lateral. Regular rate & rhythm. No rubs, gallops or murmurs. Lungs: Decrease breath sounds R>L Abdomen: soft, nontender, moderately distended. No hepatosplenomegaly. No bruits or masses. Hypoactive bowel sounds. Extremities: no cyanosis, clubbing, rash. 1+ ankle edema.  Neuro: alert & orientedx3, cranial nerves grossly intact. moves all 4 extremities w/o difficulty. Affect pleasant   Telemetry   NSR, personally reviewed  EKG    NSR with narrow QRS with run of atrial tachycardia (personally reviewed).   Labs   Basic Metabolic Panel: Recent Labs  Lab 06/26/2019 1833 07/20/19 0131 07/20/19 0420  NA 134*  --  137  K 4.0  --  4.0  CL 98  --  101  CO2 24  --  27  GLUCOSE 234*  --  195*  BUN 11  --  14  CREATININE 1.04*  --  1.21*  CALCIUM 8.8*  --  8.1*  MG  --  1.7 1.6*    Liver Function Tests: Recent Labs  Lab 07/06/2019 1833 07/20/19 0420  AST 72* 70*  ALT 44 39  ALKPHOS 345* 317*  BILITOT 2.3* 2.3*  PROT 6.2* 5.8*  ALBUMIN 2.4* 2.1*   Recent Labs  Lab 07/14/2019 1833  LIPASE 25   No results for input(s): AMMONIA in the last 168 hours.  CBC: Recent Labs  Lab 07/18/2019 1833 07/20/19 0420  WBC 7.7 7.2  NEUTROABS  --  5.7  HGB 13.1 12.1  HCT 39.2 36.4  MCV 66.4* 67.0*  PLT 290 273    Cardiac Enzymes: No results for input(s): CKTOTAL, CKMB, CKMBINDEX, TROPONINI in the last 168 hours.  BNP: BNP (last 3 results) Recent Labs    12/16/18 2350 06/10/19 2009 07/20/19 0108  BNP 1,200.8* 1,557.0* 378.1*    ProBNP (last 3 results) No results for input(s): PROBNP in the last 8760 hours.   CBG: No results for input(s): GLUCAP in the last 168 hours.  Coagulation Studies: Recent Labs    07/20/19 0420  LABPROT 14.6  INR 1.2     Imaging   Dg Chest Port 1 View  Result Date: 07/20/2019 CLINICAL DATA:  Abdominal pain with nausea EXAM: PORTABLE CHEST 1 VIEW COMPARISON:  06/10/2019 FINDINGS: Large right-sided pleural  effusion. Small moderate left-sided pleural effusion. Findings on the right have significantly increased compared to prior. Cardiomediastinal silhouette obscured. Vascular congestion and underlying pulmonary edema. Bibasilar consolidations. IMPRESSION: 1. Large right-sided pleural effusion, increased compared to prior. Small moderate left pleural effusion, grossly similar 2. Probable enlargement of the cardiomediastinal silhouette. Vascular congestion with underlying edema. Dense basilar consolidations. Electronically Signed   By: Donavan Foil M.D.   On: 07/20/2019 01:53   Dg Abd Portable 2 Views  Result Date: 07/20/2019 CLINICAL DATA:  Abdominal pain EXAM: PORTABLE ABDOMEN - 2 VIEW COMPARISON:  12/26/2018 FINDINGS: Large right-sided pleural effusion. Small moderate left pleural effusion. No gross free air beneath the diaphragm. Nonspecific diffuse decreased bowel gas with minimal gas in the stomach and left lower quadrant. No radiopaque calculi. IMPRESSION: 1. Large right pleural effusion and small moderate left pleural effusion 2. Nonspecific diffuse decreased bowel gas. Electronically Signed   By: Donavan Foil M.D.   On: 07/20/2019 02:29      Medications:     Current Medications:  aspirin EC  81 mg Oral Daily   furosemide  40 mg Intravenous Q12H   insulin aspart  0-9 Units Subcutaneous TID WC   insulin glargine  15 Units Subcutaneous QHS   ivabradine  5 mg Oral Daily  metoprolol succinate  100 mg Oral q morning - 10a   metoprolol succinate  50 mg Oral QPM   potassium chloride  20 mEq Oral Daily   sacubitril-valsartan  1 tablet Oral BID     Infusions:  heparin 800 Units/hr (07/20/19 0539)     Assessment/Plan   1. Acute on chronic systolic CHF: Last echo in 1/20 with EF 20-25%.  Nonischemic cardiomyopathy, cath in 12/19 showed no significant CAD.  RHC in 12/19 showed low cardiac output, 2.1 by Fick and 1.6 by thermo.  Patient was admitted in 7/20 with CHF, now admitted  with abdominal distention and pleural effusion.  Chronic NYHA class III symptoms.  Exam is not very impressive for JVD or peripheral edema, but she does have large right/small-mod left pleural effusions and abdominal distention is concerning for ascites.   - Agree with repeat echo.  Will need to consider ICD in the future if EF remains low, NSVT noted in ER.  Not CRT candidate with narrow QRS.  - Lasix 40 mg IV bid for now for gentle diuresis.   - Restart Entresto 24/26 bid.  She had been on 49/51 bid but stopped as she was concerned it was making her constipated (do not think this was causing constipation).  - Can continue ivabradine 5 mg bid, I have not seen atrial fibrillation.  - Continue Toprol XL 100 qam/50 qpm for now, especially with atrial tachycardia.  - Add spironolactone if BP and creatinine remain stable.  2. H/o LV thrombus and CVA: She has residual aphasia.  CVA was in 10/15.  - Warfarin on hold for thoracentesis, she is on heparin gtt.  - Long-term, she can be on warfarin alone without ASA.  3. Atrial tachycardia: Noted on ECG today.   - Continue Toprol XL at current dose and follow telemetry to see how often she has this.  4. NSVT: Noted on telemetry.   - Continue Toprol XL.  - Need to consider ICD placement.  5. Pleural effusion: Large on right, small-moderate on left.  Most likely due to CHF.  - INR 1.2 today, would arrange for thoracentesis with IR.  Would send studies for Light's criteria. 6. Abdominal distension: ?Ascites.  Will order abdominal US to assess.   Length of Stay: 0  Loralie Champagne, MD  07/20/2019, 11:04 AM  Advanced Heart Failure Team Pager [phone] (M-F; 7a - 4p)  Please contact [facility] for night-coverage after hours (4p -7a ) and weekends on [email or web address]

## 2019-07-20 NOTE — ED Provider Notes (Signed)
[facility] EMERGENCY DEPARTMENT Provider Note   CSN: [id number] Arrival date & time: 07/03/2019  1756     History   Chief Complaint Chief Complaint  Patient presents with  . Constipation    HPI Kristen Lozano is a 63 y.o. female.     Patient presents to the emergency department with a chief complaint of constipation.  She states that she has had small bowel movements over the past week, but has not had a significant bowel movement.  She has refused any stool softeners offered to her by her husband.  She denies any fever, chills, nausea, vomiting, or diarrhea.  She reports having some crampy abdominal pain.  The history is provided by the patient. No language interpreter was used.    Past Medical History:  Diagnosis Date  . Acute combined systolic and diastolic HF (heart failure) ([place]) 10/16/2018  . Allergic rhinitis    Requires cetirizine, singulair, and fluticasone.  . Anxiety    Has been on Xanax since 2009. Uses it for stress, anxiety, and insomnia. No contract yet.  . Arthritis   . CHF (congestive heart failure) (HCC)    EF 35% after stroke, presumed ischemic  . Chronic pain    Has OA of knees B. No Xrays in echart. Not requiring narcotics.  . Colitis 12/2017  . Depression   . Diabetes mellitus    Type 2, non insulin dependent. Was dx'd prior to 2008.  Marland Kitchen Hyperglycemia   . Hyperlipemia   . Hypertension   . Sickle cell trait ([place])   . Stroke [facility]) 09/05/14   Dominant left MCA infarcts secondary to unknown embolic source     Patient Active Problem List   Diagnosis Date Noted  . Acute CHF (congestive heart failure) ([place]) 06/10/2019  . Protein-calorie malnutrition, severe 12/18/2018  . Acute metabolic encephalopathy [id number]  . General weakness 10/25/2018  . Acute combined systolic and diastolic HF (heart failure) ([place]) 10/16/2018  . GERD (gastroesophageal reflux disease) 09/21/2018  . Nausea and vomiting 09/08/2018  . Nausea with vomiting  09/07/2018  . Dyspnea   . Hypomagnesemia   . Sinus tachycardia   . Malnutrition of moderate degree 08/13/2018  . Enteritis   . Pleural effusion   . Nausea 08/11/2018  . Dilated cardiomyopathy ([place])   . Nausea & vomiting 08/09/2018  . 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 Abdominal pain 08/09/2018  . Type II diabetes mellitus, uncontrolled ([place]) 06/03/2018  . Diabetic peripheral neuropathy ([place]) 06/03/2018  . Infectious colitis 01/11/2018  . Hypokalemia 01/11/2018  . Chronic combined systolic and diastolic CHF (congestive heart failure) ([place]) 12/16/2016  . Mural thrombus of heart following MI ([place]) 11/14/2016  . Monitoring for long-term anticoagulant use 11/14/2016  . Hyperlipidemia 11/14/2016  . ACC/AHA stage B congestive heart failure due to ischemic cardiomyopathy ([place]) 11/14/2016  . DKA (diabetic ketoacidoses) ([place]) 11/01/2016  . Diabetes mellitus ([place]) 11/01/2016  . Elevated troponin   . Nonischemic cardiomyopathy ([place])   . Cognitive deficit due to recent stroke 10/18/2014  . History of cerebrovascular accident 10/18/2014  . Abnormal stress test 10/17/2014  . Dysphagia, pharyngoesophageal phase 09/23/2014  . Abnormal CT scan 09/23/2014  . Dilated bile duct 09/23/2014  . Urinary retention 09/07/2014  . 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 Cerebrovascular accident (CVA) due to occlusion of cerebral artery ([place]) 09/05/2014  . Cerebral infarction ([place]) 09/05/2014  . Non compliance w medication regimen 08/10/2012  . Fatigue 02/18/2012  . Essential  hypertension 03/22/2011  . Healthcare maintenance 03/22/2011  . Chronic pain   . ALLERGIC RHINITIS, SEASONAL 03/14/2008  . Sickle cell trait ([place]) 10/31/2006  . Anxiety state 10/31/2006  . DEPRESSION 10/31/2006    Past Surgical History:  Procedure Laterality Date  . BIOPSY  09/23/2018   Procedure: BIOPSY;  Surgeon: Yetta Flock, MD;  Location: [facility] ENDOSCOPY;  Service:  Gastroenterology;;  . ESOPHAGOGASTRODUODENOSCOPY (EGD) WITH PROPOFOL N/A 09/23/2018   Procedure: ESOPHAGOGASTRODUODENOSCOPY (EGD) WITH PROPOFOL;  Surgeon: Yetta Flock, MD;  Location: [facility] ENDOSCOPY;  Service: Gastroenterology;  Laterality: N/A;  . RIGHT/LEFT HEART CATH AND CORONARY ANGIOGRAPHY N/A 10/14/2018   Procedure: RIGHT/LEFT HEART CATH AND CORONARY ANGIOGRAPHY;  Surgeon: Troy Sine, MD;  Location: [place] CV LAB;  Service: Cardiovascular;  Laterality: N/A;  . TEE WITHOUT CARDIOVERSION N/A 09/06/2014   Procedure: TRANSESOPHAGEAL ECHOCARDIOGRAM (TEE);  Surgeon: Pixie Casino, MD;  Location: [facility] ENDOSCOPY;  Service: Cardiovascular;  Laterality: N/A;     OB History   No obstetric history on file.      Home Medications    Prior to Admission medications   Medication Sig Start Date End Date Taking? Authorizing Provider  Acetaminophen 500 MG coapsule Take 1 capsule (500 mg total) by mouth daily as needed. 06/13/19   Dana Allan I, MD  ASPIRIN LOW DOSE 81 MG EC tablet Take 1 tablet (81 mg total) by mouth daily. Patient taking differently: Take 81 mg by mouth daily.  01/22/18   Hilty, Nadean Corwin, MD  docusate sodium (COLACE) 100 MG capsule Take 1 capsule (100 mg total) by mouth 2 (two) times daily as needed for mild constipation. 10/16/18   Isaiah Serge, NP  furosemide (LASIX) 40 MG tablet Take 1 tablet (40 mg total) by mouth 2 (two) times daily. Take the second dose around 3 PM 06/13/19   Dana Allan I, MD  guaiFENesin (ROBITUSSIN) 100 MG/5ML SOLN Take 15 mLs (300 mg total) by mouth every 6 (six) hours as needed for cough. 06/13/19   Bonnell Public, MD  hydrocortisone (ANUSOL-HC) 2.5 % rectal cream Apply 1 application topically 4 (four) times daily as needed for hemorrhoids. 06/13/19   Dana Allan I, MD  insulin glargine (LANTUS) 100 unit/mL SOPN Inject 15 Units into the skin at bedtime.    [provider]  ivabradine (CORLANOR) 5 MG TABS tablet  Take 1 tablet (5 mg total) by mouth 2 (two) times daily with a meal. Patient taking differently: Take 5 mg by mouth daily.  12/22/18   Eugenie Filler, MD  loratadine (CLARITIN) 10 MG tablet Take 1 tablet (10 mg total) by mouth daily as needed for allergies. 06/13/19   Dana Allan I, MD  metoprolol succinate (TOPROL-XL) 100 MG 24 hr tablet Take 1 tablet (100 mg total) by mouth every morning. Take with or immediately following a meal. In the Morning Patient taking differently: Take 100 mg by mouth every morning. Take with or immediately following a meal.  In the Morning 05/07/19   Hilty, Nadean Corwin, MD  metoprolol succinate (TOPROL-XL) 50 MG 24 hr tablet Take 1 tablet (50 mg total) by mouth at bedtime. Take with or immediately following a meal. <PLEASE MAKE APPOINTMENT FOR REFILLS> Patient taking differently: Take 25 mg by mouth every evening. Take with or immediately following a meal. <PLEASE MAKE APPOINTMENT FOR REFILLS> 05/07/19   Hilty, Nadean Corwin, MD  pantoprazole (PROTONIX) 40 MG tablet Take 1 tablet (40 mg total) by mouth daily with supper. Patient taking  differently: Take 40 mg by mouth daily as needed (indigestion).  06/04/19   Hilty, Nadean Corwin, MD  phenol (CHLORASEPTIC) 1.4 % LIQD Use as directed 1 spray in the mouth or throat as needed for throat irritation / pain. 06/13/19   Dana Allan I, MD  polyethylene glycol (MIRALAX / GLYCOLAX) packet Take 17 g by mouth daily as needed. 12/22/18   Eugenie Filler, MD  polyvinyl alcohol (LIQUIFILM TEARS) 1.4 % ophthalmic solution Place 1 drop into both eyes as needed for dry eyes. 06/13/19   Dana Allan I, MD  potassium chloride (K-DUR) 10 MEQ tablet Take 1 tablet (10 mEq total) by mouth daily. Patient taking differently: Take 5 mEq by mouth daily.  09/02/18   Georgette Shell, MD  sacubitril-valsartan (ENTRESTO) 49-51 MG Take 1 tablet by mouth 2 (two) times daily. 06/21/19   Hilty, Nadean Corwin, MD  sucralfate (CARAFATE) 1 GM/10ML  suspension Take 10 mLs (1 g total) by mouth 4 (four) times daily -  with meals and at bedtime for 10 days. Patient not taking: Reported on 06/12/2019 10/19/18 12/30/18  Margette Fast, MD  warfarin (COUMADIN) 3 MG tablet Take 1 tablet (3 mg total) by mouth one time only at 6 PM. 06/13/19 07/13/19  Bonnell Public, MD    Family History Family History  Problem Relation Age of Onset  . Diabetes Mother   . Hypertension Mother   . Heart disease Father   . Heart attack Father   . Hypertension Father   . Diabetes Father   . Ovarian cancer Sister   . Liver cancer Sister   . Sickle cell anemia Daughter   . Schizophrenia Daughter   . Hypertension Sister   . Hypertension Brother   . Hypertension Daughter   . Kidney disease Sister        x2  . Kidney disease Brother   . Stroke Neg Hx   . Esophageal cancer Neg Hx   . Colon cancer Neg Hx   . Colon polyps Neg Hx     Social History Social History   Tobacco Use  . Smoking status: Never Smoker  . Smokeless tobacco: Never Used  Substance Use Topics  . Alcohol use: No    Alcohol/week: 0.0 standard drinks  . Drug use: No     Allergies   Metformin and related and Lipitor [atorvastatin]   Review of Systems Review of Systems  All other systems reviewed and are negative.    Physical Exam Updated Vital Signs BP 121/77 (BP Location: Left Arm)   Pulse (!) 111   Temp 98 F (36.7 C) (Oral)   Resp 14   SpO2 96%   Physical Exam Vitals signs and nursing note reviewed.  Constitutional:      General: She is not in acute distress.    Appearance: She is well-developed.  HENT:     Head: Normocephalic and atraumatic.  Eyes:     Conjunctiva/sclera: Conjunctivae normal.  Neck:     Musculoskeletal: Neck supple.  Cardiovascular:     Rate and Rhythm: Normal rate and regular rhythm.     Heart sounds: No murmur.  Pulmonary:     Effort: Pulmonary effort is normal. No respiratory distress.     Breath sounds: Normal breath sounds.   Abdominal:     Palpations: Abdomen is soft.     Tenderness: There is no abdominal tenderness.     Comments: No focal tenderness  Genitourinary:    Comments: Digital rectal exam  reveals no fecal impaction, no bleeding Musculoskeletal: Normal range of motion.  Skin:    General: Skin is warm and dry.  Neurological:     Mental Status: She is alert and oriented to person, place, and time.  Psychiatric:        Mood and Affect: Mood normal.        Behavior: Behavior normal.      ED Treatments / Results  Labs (all labs ordered are listed, but only abnormal results are displayed) Labs Reviewed  COMPREHENSIVE METABOLIC PANEL - Abnormal; Notable for the following components:      Result Value   Sodium 134 (*)    Glucose, Bld 234 (*)    Creatinine, Ser 1.04 (*)    Calcium 8.8 (*)    Total Protein 6.2 (*)    Albumin 2.4 (*)    AST 72 (*)    Alkaline Phosphatase 345 (*)    Total Bilirubin 2.3 (*)    GFR calc non Af Amer 57 (*)    All other components within normal limits  CBC - Abnormal; Notable for the following components:   RBC 5.90 (*)    MCV 66.4 (*)    MCH 22.2 (*)    RDW 15.9 (*)    All other components within normal limits  LIPASE, BLOOD  URINALYSIS, ROUTINE W REFLEX MICROSCOPIC    EKG EKG Interpretation  Date/Time:  Tuesday July 20 2019 01:07:36 EDT Ventricular Rate:  157 PR Interval:    QRS Duration: 90 QT Interval:  297 QTC Calculation: 480 R Axis:   -55 Text Interpretation:  sinus tachycardia with nonsustained VT Ventricular tachycardia, unsustained LAD, consider left anterior fascicular block Anterior infarct, old Nonspecific T abnormalities, lateral leads Confirmed by Ezequiel Essex [phone]) on 07/20/2019 1:54:12 AM   Radiology Dg Chest Port 1 View  Result Date: 07/20/2019 CLINICAL DATA:  Abdominal pain with nausea EXAM: PORTABLE CHEST 1 VIEW COMPARISON:  06/10/2019 FINDINGS: Large right-sided pleural effusion. Small moderate left-sided pleural  effusion. Findings on the right have significantly increased compared to prior. Cardiomediastinal silhouette obscured. Vascular congestion and underlying pulmonary edema. Bibasilar consolidations. IMPRESSION: 1. Large right-sided pleural effusion, increased compared to prior. Small moderate left pleural effusion, grossly similar 2. Probable enlargement of the cardiomediastinal silhouette. Vascular congestion with underlying edema. Dense basilar consolidations. Electronically Signed   By: Donavan Foil M.D.   On: 07/20/2019 01:53    Procedures Procedures (including critical care time) CRITICAL CARE Performed by: Montine Circle  Non-sustained v-tach Total critical care time: 40 minutes  Critical care time was exclusive of separately billable procedures and treating other patients.  Critical care was necessary to treat or prevent imminent or life-threatening deterioration.  Critical care was time spent personally by me on the following activities: development of treatment plan with patient and/or surrogate as well as nursing, discussions with consultants, evaluation of patient's response to treatment, examination of patient, obtaining history from patient or surrogate, ordering and performing treatments and interventions, ordering and review of laboratory studies, ordering and review of radiographic studies, pulse oximetry and re-evaluation of patient's condition.  Medications Ordered in ED Medications  sodium chloride flush (NS) 0.9 % injection 3 mL (has no administration in time range)  ondansetron (ZOFRAN) injection 4 mg (4 mg Intravenous Given 07/07/2019 2218)     Initial Impression / Assessment and Plan / ED Course  I have reviewed the triage vital signs and the nursing notes.  Pertinent labs & imaging results that were available during my  care of the patient were reviewed by me and considered in my medical decision making (see chart for details).        Patient with chief complaint  of constipation.  She has been having small bowel movements over the past week.  She does not have any focal abdominal tenderness on my exam.  Digital rectal exam reveals no fecal impaction.  Will try MiraLAX and Colace.  Patient and husband understand and agree with the plan.  Return precautions given.  Prior to discharge, patient HR increased to 170s.  Then dropped to 150s.  Had a few beats of non-sustained vtach, RN said this happened a couple of times, but only one episode was captured and it was 2-3 beats.  She denies any CP or SOB.  She is a very poor historian and wants to go home.  I suspect this is the reason she is not very forthcoming with her hx.  I discussed with cardiology, Dr. Paticia Stack, due to a few runs of non-sustained v-tach. Dr. Paticia Stack recommends optimizing electrolytes, admitting to medicine and non-emergent consult in AM.  States he will add the patient to the list.  2:18 AM Discussed case with Dr. Hal Hope, who will bring the patient in.  Discussed chest x-ray finding of pleural effusion, which is worse than prior.  BNP is improved from priors.  She is not hypoxic.  She is not on oxygen.     Final Clinical Impressions(s) / ED Diagnoses   Final diagnoses:  Non-sustained ventricular tachycardia ([place])  Constipation, unspecified constipation type    ED Discharge Orders    None       Montine Circle, PA-C 07/20/19 0443    Ezequiel Essex, MD 07/20/19 [phone]

## 2019-07-20 NOTE — ED Notes (Addendum)
Admitting provider notified of pt's HR of 155. Order to give morning dose of 100mg  metoprolol verbally given. New EKGs were taken by this RN.

## 2019-07-20 NOTE — ED Notes (Signed)
Patient transported to CT 

## 2019-07-20 NOTE — Progress Notes (Signed)
[place] for heparin Indication: h/o mural thrombus  Allergies  Allergen Reactions  . Metformin And Related Nausea And Vomiting  . Lipitor [Atorvastatin] Other (See Comments)    Stomach pains    Patient Measurements: Height: 5' 3.5" (161.3 cm) Weight: 124 lb 5.4 oz (56.4 kg) IBW/kg (Calculated) : 53.55  Vital Signs: BP: 131/85 (08/25 1252) Pulse Rate: 105 (08/25 1252)  Labs: Recent Labs    07/04/2019 1833 07/20/19 0108 07/20/19 0328 07/20/19 0420 07/20/19 1225  HGB 13.1  --   --  12.1  --   HCT 39.2  --   --  36.4  --   PLT 290  --   --  273  --   LABPROT  --   --   --  14.6  --   INR  --   --   --  1.2  --   HEPARINUNFRC  --   --   --   --  0.54  CREATININE 1.04*  --   --  1.21*  --   TROPONINIHS  --  27* 27*  --   --     Estimated Creatinine Clearance: 39.7 mL/min (A) (by C-G formula based on SCr of 1.21 mg/dL (H)).   Medical History: Past Medical History:  Diagnosis Date  . Acute combined systolic and diastolic HF (heart failure) ([place]) 10/16/2018  . Allergic rhinitis    Requires cetirizine, singulair, and fluticasone.  . Anxiety    Has been on Xanax since 2009. Uses it for stress, anxiety, and insomnia. No contract yet.  . Arthritis   . CHF (congestive heart failure) (HCC)    EF 35% after stroke, presumed ischemic  . Chronic pain    Has OA of knees B. No Xrays in echart. Not requiring narcotics.  . Colitis 12/2017  . Depression   . Diabetes mellitus    Type 2, non insulin dependent. Was dx'd prior to 2008.  Marland Kitchen Hyperglycemia   . Hyperlipemia   . Hypertension   . Sickle cell trait ([place])   . Stroke [facility]) 09/05/14   Dominant left MCA infarcts secondary to unknown embolic source     Assessment: 63yo female c/o constipation w/ plan from EDP to treat at home but prior to ED discharge pt had several runs of non-sustained Vtach, now to be admitted for further w/u, to transition from Coumadin for h/o mural thrombus to  heparin.  INR 1.2 this morning. Initial heparin level this afternoon on 800 units/hr, came back therapeutic at 0.54. No s/sx of bleeding. No infusion issues per nursing.  Goal of Therapy:  Heparin level 0.3-0.7 units/ml Monitor platelets by anticoagulation protocol: Yes   Plan:  Continue heparin infusion at 800 units/hr Monitor daily HL, CBC, and for s/sx of bleeding  Antonietta Jewel, PharmD, [place] Pharmacist  Phone: [phone]  Please check AMION for all [place] phone numbers After 10:00 PM, call [place] [phone] 07/20/2019 1:20 PM

## 2019-07-20 NOTE — Progress Notes (Signed)
ANTICOAGULATION CONSULT NOTE - Initial Consult  Pharmacy Consult for heparin Indication: h/o mural thrombus  Allergies  Allergen Reactions  . Metformin And Related Nausea And Vomiting  . Lipitor [Atorvastatin] Other (See Comments)    Stomach pains    Patient Measurements: Height: 5' 3.5" (161.3 cm) Weight: 124 lb 5.4 oz (56.4 kg) IBW/kg (Calculated) : 53.55  Vital Signs: Temp: 98 F (36.7 C) (08/24 2225) Temp Source: Oral (08/24 2225) BP: 116/84 (08/25 0515) Pulse Rate: 51 (08/25 0521)  Labs: Recent Labs    07/09/2019 1833 07/20/19 0108 07/20/19 0328 07/20/19 0420  HGB 13.1  --   --  12.1  HCT 39.2  --   --  36.4  PLT 290  --   --  273  LABPROT  --   --   --  14.6  INR  --   --   --  1.2  CREATININE 1.04*  --   --  1.21*  TROPONINIHS  --  27* 27*  --     Estimated Creatinine Clearance: 39.7 mL/min (A) (by C-G formula based on SCr of 1.21 mg/dL (H)).   Medical History: Past Medical History:  Diagnosis Date  . Acute combined systolic and diastolic HF (heart failure) ([place]) 10/16/2018  . Allergic rhinitis    Requires cetirizine, singulair, and fluticasone.  . Anxiety    Has been on Xanax since 2009. Uses it for stress, anxiety, and insomnia. No contract yet.  . Arthritis   . CHF (congestive heart failure) (HCC)    EF 35% after stroke, presumed ischemic  . Chronic pain    Has OA of knees B. No Xrays in echart. Not requiring narcotics.  . Colitis 12/2017  . Depression   . Diabetes mellitus    Type 2, non insulin dependent. Was dx'd prior to 2008.  Marland Kitchen Hyperglycemia   . Hyperlipemia   . Hypertension   . Sickle cell trait ([place])   . 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 INR 1.2 - start heparin drip 800 uts/hr - previously therapeutic at this rate  Goal of Therapy:  Heparin level 0.3-0.7 units/ml Monitor platelets by anticoagulation protocol: Yes   Plan:  Will start heparin gtt at 800 units/hr (previously therapeutic at this rate)  Continue heparin while  INR <2 and monitor heparin levels and CBC.  Bonnita Nasuti Pharm.D. CPP, BCPS Clinical Pharmacist [phone] 07/20/2019 5:31 AM

## 2019-07-20 NOTE — ED Notes (Signed)
ED TO INPATIENT HANDOFF REPORT  ED Nurse Name and Phone #: .  S Name/Age/Gender Kristen Lozano 63 y.o. female Room/Bed: 037C/037C  Code Status   Code Status: Full Code  Home/SNF/Other Home Patient oriented to: self, place, time and situation Is this baseline? Yes   Triage Complete: Triage complete  Chief Complaint Constipation  Triage Note Pt accompanied by spouse who reports she has not had a BM since last week and abd pain. Nausea, no vomiting.    Allergies Allergies  Allergen Reactions  . Metformin And Related Nausea And Vomiting  . Lipitor [Atorvastatin] Other (See Comments)    Stomach pains    Level of Care/Admitting Diagnosis ED Disposition    ED Disposition Condition Comment   Admit  Hospital Area: [facility] [[id number]]  Level of Care: Progressive [102]  I expect the patient will be discharged within 24 hours: No (not a candidate for 5C-Observation unit)  Covid Evaluation: Asymptomatic Screening Protocol (No Symptoms)  Diagnosis: [facility]ustained [id number]  Admitting Physician: Rise Patience [phone]  Attending Physician: Rise Patience Lei.Right  PT Class (Do Not Modify): Observation [104]  PT Acc Code (Do Not Modify): Observation [10022]       B Medical/Surgery History Past Medical History:  Diagnosis Date  . Acute combined systolic and diastolic HF (heart failure) ([place]) 10/16/2018  . Allergic rhinitis    Requires cetirizine, singulair, and fluticasone.  . Anxiety    Has been on Xanax since 2009. Uses it for stress, anxiety, and insomnia. No contract yet.  . Arthritis   . CHF (congestive heart failure) (HCC)    EF 35% after stroke, presumed ischemic  . Chronic pain    Has OA of knees B. No Xrays in echart. Not requiring narcotics.  . Colitis 12/2017  . Depression   . Diabetes mellitus    Type 2, non insulin dependent. Was dx'd prior to 2008.  Marland Kitchen Hyperglycemia   . Hyperlipemia   .  Hypertension   . Sickle cell trait ([place])   . Stroke [facility]) 09/05/14   Dominant left MCA infarcts secondary to unknown embolic source    Past Surgical History:  Procedure Laterality Date  . BIOPSY  09/23/2018   Procedure: BIOPSY;  Surgeon: Yetta Flock, MD;  Location: [facility] ENDOSCOPY;  Service: Gastroenterology;;  . ESOPHAGOGASTRODUODENOSCOPY (EGD) WITH PROPOFOL N/A 09/23/2018   Procedure: ESOPHAGOGASTRODUODENOSCOPY (EGD) WITH PROPOFOL;  Surgeon: Yetta Flock, MD;  Location: [facility] ENDOSCOPY;  Service: Gastroenterology;  Laterality: N/A;  . RIGHT/LEFT HEART CATH AND CORONARY ANGIOGRAPHY N/A 10/14/2018   Procedure: RIGHT/LEFT HEART CATH AND CORONARY ANGIOGRAPHY;  Surgeon: Troy Sine, MD;  Location: [facility] CV LAB;  Service: Cardiovascular;  Laterality: N/A;  . TEE WITHOUT CARDIOVERSION N/A 09/06/2014   Procedure: TRANSESOPHAGEAL ECHOCARDIOGRAM (TEE);  Surgeon: Pixie Casino, MD;  Location: [facility] ENDOSCOPY;  Service: Cardiovascular;  Laterality: N/A;     A IV Location/Drains/Wounds Patient Lines/Drains/Airways Status   Active Line/Drains/Airways    Name:   Placement date:   Placement time:   Site:   Days:   Peripheral IV 07/03/2019 Right Antecubital   07/15/2019    2218    Antecubital   1   Peripheral IV 07/20/19 Left Forearm   07/20/19    0118    Forearm   less than 1          Intake/Output Last 24 hours No intake or output data in the 24 hours ending 07/20/19 0839  Labs/Imaging  Results for orders placed or performed during the hospital encounter of 07/05/2019 (from the past 48 hour(s))  Lipase, blood     Status: None   Collection Time: 07/23/2019  6:33 PM  Result Value Ref Range   Lipase 25 11 - 51 U/L    Comment: Performed at [facility], [place] [address], [place], [place] [address]  Comprehensive metabolic panel     Status: Abnormal   Collection Time: 06/30/2019  6:33 PM  Result Value Ref Range   Sodium 134 (L) 135 - 145 mmol/L   Potassium 4.0 3.5 - 5.1 mmol/L    Chloride 98 98 - 111 mmol/L   CO2 24 22 - 32 mmol/L   Glucose, Bld 234 (H) 70 - 99 mg/dL   BUN 11 8 - 23 mg/dL   Creatinine, Ser 1.04 (H) 0.44 - 1.00 mg/dL   Calcium 8.8 (L) 8.9 - 10.3 mg/dL   Total Protein 6.2 (L) 6.5 - 8.1 g/dL   Albumin 2.4 (L) 3.5 - 5.0 g/dL   AST 72 (H) 15 - 41 U/L   ALT 44 0 - 44 U/L   Alkaline Phosphatase 345 (H) 38 - 126 U/L   Total Bilirubin 2.3 (H) 0.3 - 1.2 mg/dL   GFR calc non Af Amer 57 (L) >60 mL/min   GFR calc Af Amer >60 >60 mL/min   Anion gap 12 5 - 15    Comment: Performed at [facility], [place] [address], [place], [place] [address]  CBC     Status: Abnormal   Collection Time: 07/08/2019  6:33 PM  Result Value Ref Range   WBC 7.7 4.0 - 10.5 K/uL   RBC 5.90 (H) 3.87 - 5.11 MIL/uL   Hemoglobin 13.1 12.0 - 15.0 g/dL   HCT 39.2 36.0 - 46.0 %   MCV 66.4 (L) 80.0 - 100.0 fL   MCH 22.2 (L) 26.0 - 34.0 pg   MCHC 33.4 30.0 - 36.0 g/dL   RDW 15.9 (H) 11.5 - 15.5 %   Platelets 290 150 - 400 K/uL   nRBC 0.0 0.0 - 0.2 %    Comment: Performed at [facility], [place] [address] [place], [place] [address]  Troponin I (High Sensitivity)     Status: Abnormal   Collection Time: 07/20/19  1:08 AM  Result Value Ref Range   Troponin I (High Sensitivity) 27 (H) <18 ng/L    Comment: (NOTE) Elevated high sensitivity troponin I (hsTnI) values and significant  changes across serial measurements may suggest ACS but many other  chronic and acute conditions are known to elevate hsTnI results.  Refer to the "Links" section for chest pain algorithms and additional  guidance. Performed at [facility], [place] [address], [place], [place] [address]   Brain natriuretic peptide     Status: Abnormal   Collection Time: 07/20/19  1:08 AM  Result Value Ref Range   B Natriuretic Peptide 378.1 (H) 0.0 - 100.0 pg/mL    Comment: Performed at [facility] [address] [place], [place] [address]  Magnesium     Status: None   Collection Time: 07/20/19  1:31  AM  Result Value Ref Range   Magnesium 1.7 1.7 - 2.4 mg/dL    Comment: Performed at [facility] [address] [place], [place] [address]  Troponin I (High Sensitivity)     Status: Abnormal   Collection Time: 07/20/19  3:28 AM  Result Value Ref  Range   Troponin I (High Sensitivity) 27 (H) <18 ng/L    Comment: (NOTE) Elevated high sensitivity troponin I (hsTnI) values and significant  changes across serial measurements may suggest ACS but many other  chronic and acute conditions are known to elevate hsTnI results.  Refer to the "Links" section for chest pain algorithms and additional  guidance. Performed at [facility], [place] [address], [place], [place] [address]   Protime-INR     Status: None   Collection Time: 07/20/19  4:20 AM  Result Value Ref Range   Prothrombin Time 14.6 11.4 - 15.2 seconds   INR 1.2 0.8 - 1.2    Comment: (NOTE) INR goal varies based on device and disease states. Performed at [facility], [place] [address] [place], [place] [address]   Comprehensive metabolic panel     Status: Abnormal   Collection Time: 07/20/19  4:20 AM  Result Value Ref Range   Sodium 137 135 - 145 mmol/L   Potassium 4.0 3.5 - 5.1 mmol/L   Chloride 101 98 - 111 mmol/L   CO2 27 22 - 32 mmol/L   Glucose, Bld 195 (H) 70 - 99 mg/dL   BUN 14 8 - 23 mg/dL   Creatinine, Ser 1.21 (H) 0.44 - 1.00 mg/dL   Calcium 8.1 (L) 8.9 - 10.3 mg/dL   Total Protein 5.8 (L) 6.5 - 8.1 g/dL   Albumin 2.1 (L) 3.5 - 5.0 g/dL   AST 70 (H) 15 - 41 U/L   ALT 39 0 - 44 U/L   Alkaline Phosphatase 317 (H) 38 - 126 U/L   Total Bilirubin 2.3 (H) 0.3 - 1.2 mg/dL   GFR calc non Af Amer 47 (L) >60 mL/min   GFR calc Af Amer 55 (L) >60 mL/min   Anion gap 9 5 - 15    Comment: Performed at [facility], [place] [address], [place], [place] [address]  Magnesium     Status: Abnormal   Collection Time: 07/20/19  4:20 AM  Result Value Ref Range   Magnesium 1.6 (L) 1.7 - 2.4 mg/dL    Comment:  Performed at [facility] [address], [place],  [address]  CBC WITH DIFFERENTIAL     Status: Abnormal   Collection Time: 07/20/19  4:20 AM  Result Value Ref Range   WBC 7.2 4.0 - 10.5 K/uL   RBC 5.43 (H) 3.87 - 5.11 MIL/uL   Hemoglobin 12.1 12.0 - 15.0 g/dL   HCT 36.4 36.0 - 46.0 %   MCV 67.0 (L) 80.0 - 100.0 fL   MCH 22.3 (L) 26.0 - 34.0 pg   MCHC 33.2 30.0 - 36.0 g/dL   RDW 15.8 (H) 11.5 - 15.5 %   Platelets 273 150 - 400 K/uL    Comment: REPEATED TO VERIFY   nRBC 0.0 0.0 - 0.2 %   Neutrophils Relative % 79 %   Neutro Abs 5.7 1.7 - 7.7 K/uL   Lymphocytes Relative 10 %   Lymphs Abs 0.7 0.7 - 4.0 K/uL   Monocytes Relative 10 %   Monocytes Absolute 0.7 0.1 - 1.0 K/uL   Eosinophils Relative 0 %   Eosinophils Absolute 0.0 0.0 - 0.5 K/uL   Basophils Relative 1 %   Basophils Absolute 0.1 0.0 - 0.1 K/uL   Immature Granulocytes 0 %   Abs Immature Granulocytes 0.03 0.00 - 0.07 K/uL    Comment: Performed at [facility], [place]  [address] [place], [place] [address]  TSH     Status: Abnormal   Collection Time: 07/20/19  4:20 AM  Result Value Ref Range   TSH 5.060 (H) 0.350 - 4.500 uIU/mL    Comment: Performed by a 3rd Generation assay with a functional sensitivity of <=0.01 uIU/mL. Performed at [facility], [place] [address], [place], [place] [address]    Dg Chest Port 1 View  Result Date: 07/20/2019 CLINICAL DATA:  Abdominal pain with nausea EXAM: PORTABLE CHEST 1 VIEW COMPARISON:  06/10/2019 FINDINGS: Large right-sided pleural effusion. Small moderate left-sided pleural effusion. Findings on the right have significantly increased compared to prior. Cardiomediastinal silhouette obscured. Vascular congestion and underlying pulmonary edema. Bibasilar consolidations. IMPRESSION: 1. Large right-sided pleural effusion, increased compared to prior. Small moderate left pleural effusion, grossly similar 2. Probable enlargement of the cardiomediastinal silhouette.  Vascular congestion with underlying edema. Dense basilar consolidations. Electronically Signed   By: Donavan Foil M.D.   On: 07/20/2019 01:53   Dg Abd Portable 2 Views  Result Date: 07/20/2019 CLINICAL DATA:  Abdominal pain EXAM: PORTABLE ABDOMEN - 2 VIEW COMPARISON:  12/26/2018 FINDINGS: Large right-sided pleural effusion. Small moderate left pleural effusion. No gross free air beneath the diaphragm. Nonspecific diffuse decreased bowel gas with minimal gas in the stomach and left lower quadrant. No radiopaque calculi. IMPRESSION: 1. Large right pleural effusion and small moderate left pleural effusion 2. Nonspecific diffuse decreased bowel gas. Electronically Signed   By: Donavan Foil M.D.   On: 07/20/2019 02:29    Pending Labs Unresulted Labs (From admission, onward)    Start     Ordered   07/20/19 1100  Heparin level (unfractionated)  Once-Timed,   STAT     07/20/19 0530   07/20/19 0500  Protime-INR  Daily,   R     07/20/19 0402   07/20/19 0417  Hemoglobin A1c  Once,   STAT    Comments: To assess prior glycemic control    07/20/19 0417   07/20/19 0235  SARS CORONAVIRUS 2 (TAT 6-12 HRS) Nasal Swab Aptima Multi Swab  (Asymptomatic/Tier 2 Patients Labs)  Once,   STAT    Question Answer Comment  Is this test for diagnosis or screening Screening   Symptomatic for COVID-19 as defined by CDC No   Hospitalized for COVID-19 No   Admitted to ICU for COVID-19 No   Previously tested for COVID-19 Yes   Resident in a congregate (group) care setting No   Employed in healthcare setting No   Pregnant No      07/20/19 0234   07/23/2019 1833  Urinalysis, Routine w reflex microscopic  ONCE - STAT,   STAT     07/11/2019 1832          Vitals/Pain Today's Vitals   07/20/19 0645 07/20/19 0700 07/20/19 0715 07/20/19 0800  BP: 114/72 121/80 112/82 125/88  Pulse: (!) 101 75 96 (!) 101  Resp: (!) 21 16 (!) 21 (!) 29  Temp:      TempSrc:      SpO2: 100% 100% 100% 94%  Weight:      Height:       PainSc:        Isolation Precautions Airborne and Contact precautions  Medications Medications  aspirin EC tablet 81 mg (has no administration in time range)  ivabradine (CORLANOR) tablet 5 mg (has no administration in time range)  metoprolol succinate (TOPROL-XL) 24 hr tablet 100 mg (100 mg Oral Given 07/20/19 0441)  metoprolol succinate (TOPROL-XL) 24 hr tablet 25 mg (has no administration in time range)  insulin glargine (LANTUS) Solostar Pen 15 Units (has no administration in time range)  polyethylene glycol (MIRALAX / GLYCOLAX) packet 17 g (has no administration in time range)  acetaminophen (TYLENOL) tablet 650 mg (has no administration in time range)    Or  acetaminophen (TYLENOL) suppository 650 mg (has no administration in time range)  insulin aspart (novoLOG) injection 0-9 Units (has no administration in time range)  furosemide (LASIX) injection 40 mg (40 mg Intravenous Given 07/20/19 0441)  potassium chloride SA (K-DUR) CR tablet 20 mEq (20 mEq Oral Not Given 07/20/19 0441)  heparin ADULT infusion 100 units/mL (25000 units/284mL sodium chloride 0.45%) (800 Units/hr Intravenous New Bag/Given 07/20/19 0539)  sodium chloride flush (NS) 0.9 % injection 3 mL (3 mLs Intravenous Given by Other 07/20/19 0336)  ondansetron (ZOFRAN) injection 4 mg (4 mg Intravenous Given 06/28/2019 2218)  metoprolol tartrate (LOPRESSOR) injection 5 mg (5 mg Intravenous Given 07/20/19 0208)  magnesium sulfate IVPB 2 g 50 mL (0 g Intravenous Stopped 07/20/19 0743)    Mobility walks High fall risk   Focused Assessments Cardiac Assessment Handoff:  Cardiac Rhythm: Sinus tachycardia Lab Results  Component Value Date   CKTOTAL 112 09/22/2018   TROPONINI 1.27 ([place]) 12/17/2018   Lab Results  Component Value Date   DDIMER <0.27 11/21/2018   Does the Patient currently have chest pain? No     R Recommendations: See Admitting Provider Note  Report given to:   Additional Notes: .

## 2019-07-20 NOTE — ED Notes (Signed)
Admitting provider notified of pt's HR maintaining 110-155 bpm. Provider called and told this RN to notify him if HR increased again to 150s and he would place order for 5mg  labetalol IV.

## 2019-07-20 NOTE — Progress Notes (Signed)
ANTICOAGULATION CONSULT NOTE - Initial Consult  Pharmacy Consult for heparin Indication: h/o mural thrombus  Allergies  Allergen Reactions  . Metformin And Related Nausea And Vomiting  . Lipitor [Atorvastatin] Other (See Comments)    Stomach pains    Patient Measurements: Height: 5' 3.5" (161.3 cm) Weight: 124 lb 5.4 oz (56.4 kg) IBW/kg (Calculated) : 53.55  Vital Signs: Temp: 98 F (36.7 C) (08/24 2225) Temp Source: Oral (08/24 2225) BP: 139/85 (08/25 0415) Pulse Rate: 110 (08/25 0200)  Labs: Recent Labs    07/24/2019 1833 07/20/19 0108  HGB 13.1  --   HCT 39.2  --   PLT 290  --   CREATININE 1.04*  --   TROPONINIHS  --  27*    Estimated Creatinine Clearance: 46.2 mL/min (A) (by C-G formula based on SCr of 1.04 mg/dL (H)).   Medical History: Past Medical History:  Diagnosis Date  . Acute combined systolic and diastolic HF (heart failure) ([place]) 10/16/2018  . Allergic rhinitis    Requires cetirizine, singulair, and fluticasone.  . Anxiety    Has been on Xanax since 2009. Uses it for stress, anxiety, and insomnia. No contract yet.  . Arthritis   . CHF (congestive heart failure) (HCC)    EF 35% after stroke, presumed ischemic  . Chronic pain    Has OA of knees B. No Xrays in echart. Not requiring narcotics.  . Colitis 12/2017  . Depression   . Diabetes mellitus    Type 2, non insulin dependent. Was dx'd prior to 2008.  Marland Kitchen Hyperglycemia   . Hyperlipemia   . Hypertension   . Sickle cell trait ([place])   . Stroke [facility]) 09/05/14   Dominant left MCA infarcts secondary to unknown embolic source     Assessment: 63yo female c/o constipation w/ plan from EDP to treat at home but prior to ED discharge pt had several runs of non-sustained Vtach, now to be admitted for further w/u, to transition from Coumadin for h/o mural thrombus to heparin.  Goal of Therapy:  Heparin level 0.3-0.7 units/ml Monitor platelets by anticoagulation protocol: Yes   Plan:  Will start  heparin gtt at 800 units/hr (previously therapeutic at this rate) when INR <2 and monitor heparin levels and CBC.  Wynona Neat, PharmD, BCPS  07/20/2019,4:30 AM

## 2019-07-20 NOTE — ED Notes (Signed)
Lunch Tray Ordered @ 1010-per Deidre Ala, RN called by Levada Dy

## 2019-07-20 NOTE — ED Notes (Signed)
Handoff report given to [place], [place]

## 2019-07-20 NOTE — ED Notes (Signed)
Ordered pt hospital bed.

## 2019-07-20 NOTE — Progress Notes (Signed)
PROGRESS NOTE    HOA BRIGGS  PXT:[id number] DOB: 02/19/1956 DOA: 07/07/2019 PCP: Audley Hose, MD    Brief Narrative:  63 year old female who presented with constipation.  She does have significant past medical history for systolic heart failure, CVA, hypertension, type 2 diabetes mellitus and sickle cell trait.  She reported worsening constipation for the last 3 days, associated with nausea and vomiting.  No abdominal pain.  In the emergency department she was noted to have sinus tachycardia with nonsustained ventricular tachycardia.  Blood pressure was 115/79, heart rate 110, respirate 21-28, oxygen saturation 96%, her lungs are clear to auscultation bilaterally, heart S1-S2 present, tachycardic, abdomen was soft and nontender, bowel sounds are present, no lower extremity edema. Sodium 134, potassium 4.0, chloride 98, bicarb 24, glucose 234, BUN 11, creatinine 1.0, troponin high sensitivity 27, white count 7.7, hemoglobin 13.1, hematocrit 39.2, platelets 290.  SARS COVID-19 was negative. Chest film with large pleural effusion, possible loculation. Positive bilateral interstitial infiltrates.  EKG 105 bpm, normal axis, normal intervals, sinus rhythm with poor R wave progression.  Repeat tracings with positive PVCs.  Patient was admitted to the hospital with a working diagnosis of nonsustained ventricular tachycardia's, complicated with a right large pleural effusion.    Assessment & Plan:   Principal Problem:   Non-sustained ventricular tachycardia (HCC) Active Problems:   Essential hypertension   Secondary cardiomyopathy (HCC)   Nonischemic cardiomyopathy (HCC)   Mural thrombus of heart following MI ([place])   Type 2 diabetes mellitus with vascular disease (HCC)   Malnutrition of moderate degree   Nausea with vomiting   Bilateral pleural effusion   1. Acute on chronic systolic heart failure decompensation/ complicated with worsening right pleural effusion and non sustained  ventricular tachycardia. Patient with hypervolemia, with worsening right pleural effusion and pulmonary edema. Will procede with US guided thoracentesis, suspected transudate. Follow on LDH, protein, pH and cell count. Check cultures. Continue diuresis with furosemide 40 mg IV bid, continue blood pressure and oxymetry monitoring. Continue ivabradine, metoprolol and entresto for heart failure management.   2. CVA with LV thrombus. Will continue to hold on warfarin for thoracentesis, continue anticoagulation with heparin drip for now. Patient foes have mild cognitive impairment.   3. T2DM. Will continue glucose cover and monitoring with insulin sliding scale. Patient is tolerating po well. Basal insulin with 15 units daily.   4. Stage 2 CKD with hypokalemia. Stable renal function with serum cr at 1,2 with K at 4,0 and serum bicarbonate at 27. Will follow on renal panel in am. Continue diuresis with furosemide   5. Constipation. Continue bowel regimen, with dulcolax and miralax.   Patient continue to be volume overload and high risk of decompensation, will change to inpatient.   DVT prophylaxis: heparin   Code Status:  full Family Communication: I spoke with patient's husband at the bedside and all questions were addressed.  Disposition Plan/ discharge barriers:  Pending clinical improvement.   Body mass index is 21.68 kg/m. Malnutrition Type:      Malnutrition Characteristics:      Nutrition Interventions:     RN Pressure Injury Documentation:     Consultants:   Cardiology   Procedures:     Antimicrobials:       Subjective: Patient continue to have dyspnea, very weak and deconditioned, most of the information from her husband at the bedside. Patient not cooperative with examination or interrogation.   Objective: Vitals:   07/20/19 1100 07/20/19 1115 07/20/19 1130 07/20/19 1252  BP: 96/65  92/66 131/85  Pulse:    (!) 105  Resp: 12 (!) 25  (!) 28  Temp:       TempSrc:      SpO2:    90%  Weight:      Height:       No intake or output data in the 24 hours ending 07/20/19 1342 Filed Weights   07/20/19 0400  Weight: 56.4 kg    Examination:   General: deconditioned and ill looking appearing  Neurology: Awake and alert, non focal  E ENT: positive pallor, no icterus, oral mucosa moist Cardiovascular: Positive JVD. S1-S2 present, rhythmic, no gallops, rubs, or murmurs. ++ pitting bilateral lower extremity edema. Pulmonary: decreased breath sounds at the right side no wheezing, rhonchi or rales. Gastrointestinal. Abdomen positive distention but with no organomegaly, non tender, no rebound or guarding Skin. No rashes Musculoskeletal: no joint deformities     Data Reviewed: I have personally reviewed following labs and imaging studies  CBC: Recent Labs  Lab 07/10/2019 1833 07/20/19 0420  WBC 7.7 7.2  NEUTROABS  --  5.7  HGB 13.1 12.1  HCT 39.2 36.4  MCV 66.4* 67.0*  PLT 290 696   Basic Metabolic Panel: Recent Labs  Lab 07/05/2019 1833 07/20/19 0131 07/20/19 0420  NA 134*  --  137  K 4.0  --  4.0  CL 98  --  101  CO2 24  --  27  GLUCOSE 234*  --  195*  BUN 11  --  14  CREATININE 1.04*  --  1.21*  CALCIUM 8.8*  --  8.1*  MG  --  1.7 1.6*   GFR: Estimated Creatinine Clearance: 39.7 mL/min (A) (by C-G formula based on SCr of 1.21 mg/dL (H)). Liver Function Tests: Recent Labs  Lab 07/05/2019 1833 07/20/19 0420  AST 72* 70*  ALT 44 39  ALKPHOS 345* 317*  BILITOT 2.3* 2.3*  PROT 6.2* 5.8*  ALBUMIN 2.4* 2.1*   Recent Labs  Lab 07/25/2019 1833  LIPASE 25   No results for input(s): AMMONIA in the last 168 hours. Coagulation Profile: Recent Labs  Lab 07/20/19 0420  INR 1.2   Cardiac Enzymes: No results for input(s): CKTOTAL, CKMB, CKMBINDEX, TROPONINI in the last 168 hours. BNP (last 3 results) No results for input(s): PROBNP in the last 8760 hours. HbA1C: No results for input(s): HGBA1C in the last 72 hours. CBG:  Recent Labs  Lab 07/20/19 1247  GLUCAP 163*   Lipid Profile: No results for input(s): CHOL, HDL, LDLCALC, TRIG, CHOLHDL, LDLDIRECT in the last 72 hours. Thyroid Function Tests: Recent Labs    07/20/19 0420  TSH 5.060*   Anemia Panel: No results for input(s): VITAMINB12, FOLATE, FERRITIN, TIBC, IRON, RETICCTPCT in the last 72 hours.    Radiology Studies: I have reviewed all of the imaging during this hospital visit personally     Scheduled Meds: . aspirin EC  81 mg Oral Daily  . furosemide  40 mg Intravenous Q12H  . insulin aspart  0-9 Units Subcutaneous TID WC  . insulin glargine  15 Units Subcutaneous QHS  . ivabradine  5 mg Oral Daily  . metoprolol succinate  100 mg Oral q morning - 10a  . metoprolol succinate  50 mg Oral QPM  . potassium chloride  20 mEq Oral Daily  . sacubitril-valsartan  1 tablet Oral BID   Continuous Infusions: . heparin 800 Units/hr (07/20/19 0539)     LOS: 0 days  Samanatha Brammer Gerome Apley, MD

## 2019-07-20 NOTE — Progress Notes (Signed)
[place] for heparin Indication: h/o mural thrombus  Allergies  Allergen Reactions  . Metformin And Related Nausea And Vomiting  . Lipitor [Atorvastatin] Other (See Comments)    Stomach pains    Patient Measurements: Height: 5' 3.5" (161.3 cm) Weight: 119 lb 0.8 oz (54 kg) IBW/kg (Calculated) : 53.55  Vital Signs: Temp: 97.6 F (36.4 C) (08/25 1900) Temp Source: Oral (08/25 1900) BP: 83/73 (08/25 1900) Pulse Rate: 105 (08/25 1900)  Labs: Recent Labs    07/22/2019 1833 07/20/19 0108 07/20/19 0328 07/20/19 0420 07/20/19 1225 07/20/19 1843  HGB 13.1  --   --  12.1  --   --   HCT 39.2  --   --  36.4  --   --   PLT 290  --   --  273  --   --   LABPROT  --   --   --  14.6  --   --   INR  --   --   --  1.2  --   --   HEPARINUNFRC  --   --   --   --  0.54 0.82*  CREATININE 1.04*  --   --  1.21*  --   --   TROPONINIHS  --  27* 27*  --   --   --     Estimated Creatinine Clearance: 39.7 mL/min (A) (by C-G formula based on SCr of 1.21 mg/dL (H)).   Medical History: Past Medical History:  Diagnosis Date  . Acute combined systolic and diastolic HF (heart failure) ([place]) 10/16/2018  . Allergic rhinitis    Requires cetirizine, singulair, and fluticasone.  . Anxiety    Has been on Xanax since 2009. Uses it for stress, anxiety, and insomnia. No contract yet.  . Arthritis   . CHF (congestive heart failure) (HCC)    EF 35% after stroke, presumed ischemic  . Chronic pain    Has OA of knees B. No Xrays in echart. Not requiring narcotics.  . Colitis 12/2017  . Depression   . Diabetes mellitus    Type 2, non insulin dependent. Was dx'd prior to 2008.  [place] Kitchen Hyperglycemia   . Hyperlipemia   . Hypertension   . Sickle cell trait ([place])   . 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  Evening heparin level above goal at 0.82. No bleeding issues noted.   Goal of Therapy:  Heparin level 0.3-0.7 units/ml Monitor platelets by anticoagulation protocol: Yes   Plan: Reduce heparin infusion to 700 units/hr Monitor daily HL, CBC, and for s/sx of bleeding  Erin Hearing PharmD., BCPS Clinical Pharmacist 07/20/2019 8:32 PM

## 2019-07-21 ENCOUNTER — Inpatient Hospital Stay (HOSPITAL_COMMUNITY): Payer: Medicare HMO | Admitting: Certified Registered"

## 2019-07-21 ENCOUNTER — Inpatient Hospital Stay (HOSPITAL_COMMUNITY): Payer: Medicare HMO

## 2019-07-21 DIAGNOSIS — R570 Cardiogenic shock: Secondary | ICD-10-CM

## 2019-07-21 DIAGNOSIS — J942 Hemothorax: Secondary | ICD-10-CM

## 2019-07-21 DIAGNOSIS — I5023 Acute on chronic systolic (congestive) heart failure: Secondary | ICD-10-CM

## 2019-07-21 DIAGNOSIS — I469 Cardiac arrest, cause unspecified: Secondary | ICD-10-CM

## 2019-07-21 LAB — POCT I-STAT EG7
Acid-base deficit: 2 mmol/L (ref 0.0–2.0)
Bicarbonate: 27 mmol/L (ref 20.0–28.0)
Calcium, Ion: 1.01 mmol/L — ABNORMAL LOW (ref 1.15–1.40)
HCT: 22 % — ABNORMAL LOW (ref 36.0–46.0)
Hemoglobin: 7.5 g/dL — ABNORMAL LOW (ref 12.0–15.0)
O2 Saturation: 37 %
Patient temperature: 98.6
Potassium: 3.5 mmol/L (ref 3.5–5.1)
Sodium: 144 mmol/L (ref 135–145)
TCO2: 29 mmol/L (ref 22–32)
pCO2, Ven: 79.1 mmHg (ref 44.0–60.0)
pH, Ven: 7.142 — CL (ref 7.250–7.430)
pO2, Ven: 29 mmHg — CL (ref 32.0–45.0)

## 2019-07-21 LAB — COMPREHENSIVE METABOLIC PANEL
ALT: 60 U/L — ABNORMAL HIGH (ref 0–44)
AST: 122 U/L — ABNORMAL HIGH (ref 15–41)
Albumin: 1.6 g/dL — ABNORMAL LOW (ref 3.5–5.0)
Alkaline Phosphatase: 262 U/L — ABNORMAL HIGH (ref 38–126)
Anion gap: 16 — ABNORMAL HIGH (ref 5–15)
BUN: 18 mg/dL (ref 8–23)
CO2: 25 mmol/L (ref 22–32)
Calcium: 7.9 mg/dL — ABNORMAL LOW (ref 8.9–10.3)
Chloride: 101 mmol/L (ref 98–111)
Creatinine, Ser: 1.87 mg/dL — ABNORMAL HIGH (ref 0.44–1.00)
GFR calc Af Amer: 32 mL/min — ABNORMAL LOW (ref >60)
GFR calc non Af Amer: 28 mL/min — ABNORMAL LOW (ref >60)
Glucose, Bld: 174 mg/dL — ABNORMAL HIGH (ref 70–99)
Potassium: 3.3 mmol/L — ABNORMAL LOW (ref 3.5–5.1)
Sodium: 142 mmol/L (ref 135–145)
Total Bilirubin: 1.6 mg/dL — ABNORMAL HIGH (ref 0.3–1.2)
Total Protein: 4.4 g/dL — ABNORMAL LOW (ref 6.5–8.1)

## 2019-07-21 LAB — HEPARIN LEVEL (UNFRACTIONATED): Heparin Unfractionated: 0.56 IU/mL (ref 0.30–0.70)

## 2019-07-21 LAB — CBC
HCT: 28.5 % — ABNORMAL LOW (ref 36.0–46.0)
Hemoglobin: 9.4 g/dL — ABNORMAL LOW (ref 12.0–15.0)
MCH: 22.8 pg — ABNORMAL LOW (ref 26.0–34.0)
MCHC: 33 g/dL (ref 30.0–36.0)
MCV: 69 fL — ABNORMAL LOW (ref 80.0–100.0)
Platelets: 214 10*3/uL (ref 150–400)
RBC: 4.13 MIL/uL (ref 3.87–5.11)
RDW: 15.7 % — ABNORMAL HIGH (ref 11.5–15.5)
WBC: 15.4 10*3/uL — ABNORMAL HIGH (ref 4.0–10.5)
nRBC: 1 % — ABNORMAL HIGH (ref 0.0–0.2)

## 2019-07-21 LAB — GLUCOSE, CAPILLARY
Glucose-Capillary: 108 mg/dL — ABNORMAL HIGH (ref 70–99)
Glucose-Capillary: 115 mg/dL — ABNORMAL HIGH (ref 70–99)

## 2019-07-21 LAB — PROTIME-INR
INR: 2 — ABNORMAL HIGH (ref 0.8–1.2)
Prothrombin Time: 22 seconds — ABNORMAL HIGH (ref 11.4–15.2)

## 2019-07-21 LAB — HEMOGLOBIN A1C
Hgb A1c MFr Bld: 7.3 % — ABNORMAL HIGH (ref 4.8–5.6)
Mean Plasma Glucose: 163 mg/dL

## 2019-07-21 LAB — MAGNESIUM: Magnesium: 2.4 mg/dL (ref 1.7–2.4)

## 2019-07-21 LAB — LACTIC ACID, PLASMA: Lactic Acid, Venous: 8.2 mmol/L (ref 0.5–1.9)

## 2019-07-21 LAB — PHOSPHORUS: Phosphorus: 8.1 mg/dL — ABNORMAL HIGH (ref 2.5–4.6)

## 2019-07-21 MED ORDER — NOREPINEPHRINE 4 MG/250ML-% IV SOLN
INTRAVENOUS | Status: AC
  Administered 2019-07-21: 07:00:00 50 ug/min via INTRAVENOUS
  Filled 2019-07-21: qty 500

## 2019-07-21 MED ORDER — TRAMADOL HCL 50 MG PO TABS
50.0000 mg | ORAL_TABLET | Freq: Once | ORAL | Status: AC
  Administered 2019-07-21: 03:00:00 50 mg via ORAL
  Filled 2019-07-21: qty 1

## 2019-07-21 MED ORDER — NOREPINEPHRINE 4 MG/250ML-% IV SOLN
2.0000 ug/min | INTRAVENOUS | Status: DC
  Administered 2019-07-21 (×2): 50 ug/min via INTRAVENOUS
  Filled 2019-07-21 (×2): qty 250

## 2019-07-21 MED ORDER — SODIUM CHLORIDE 0.9 % IV SOLN: 250.0000 mL | INTRAVENOUS | Status: DC

## 2019-07-21 MED ORDER — EPINEPHRINE 1 MG/10ML IJ SOSY
PREFILLED_SYRINGE | INTRAMUSCULAR | Status: AC
  Filled 2019-07-21: qty 40

## 2019-07-21 MED ORDER — SODIUM BICARBONATE 8.4 % IV SOLN
50.0000 meq | Freq: Once | INTRAVENOUS | Status: AC
  Administered 2019-07-21: 07:00:00 50 meq via INTRAVENOUS

## 2019-07-21 MED ORDER — NOREPINEPHRINE 16 MG/250ML-% IV SOLN: 0.0000 ug/min | INTRAVENOUS | Status: DC

## 2019-07-21 MED ORDER — CHLORHEXIDINE GLUCONATE CLOTH 2 % EX PADS: 6.0000 | MEDICATED_PAD | Freq: Every day | CUTANEOUS | Status: DC

## 2019-07-21 MED ORDER — SODIUM BICARBONATE 8.4 % IV SOLN
100.0000 meq | Freq: Once | INTRAVENOUS | Status: AC
  Administered 2019-07-21: 06:00:00 100 meq via INTRAVENOUS

## 2019-07-21 MED ORDER — SODIUM BICARBONATE 8.4 % IV SOLN
INTRAVENOUS | Status: AC
  Administered 2019-07-21: 100 meq via INTRAVENOUS
  Filled 2019-07-21: qty 500

## 2019-07-22 ENCOUNTER — Telehealth: Payer: Self-pay

## 2019-07-22 NOTE — Telephone Encounter (Signed)
Received dc from [employer]  Dc is for burial and a patient of Doctor Scatliffe.   DC will be taken to Pulmonary Unit for signature.   On 07/23/2019 Received signed dc back from Doctor [place]. I called the funeral home to let them know the dc is ready for pickup.

## 2019-07-24 MED FILL — Medication: Qty: 1 | Status: AC

## 2019-07-27 NOTE — Consult Note (Signed)
NAME:  Kristen Lozano, MRN:  [id number], DOB:  June 03, 1956, LOS: 1 ADMISSION DATE:  07/26/2019, CONSULTATION DATE:  08-09-2019 REFERRING MD: Dr Cathlean Sauer  , CHIEF COMPLAINT:  Cardiac arrest  Brief History   63 year old female with LVEF 20% admitted for NSVT and pleural effusion. Suffered cardiac arrest in the AM hours of 8/26.  History of present illness   63 year old female with PMH as below, which is significant for CHF, DM, HLD, HTN, LV thrombus and CVA. She presented to the ED 8/25 with complaints of nausea/vomiting and constipation. While in the ED she was noted to have several runs of NSVT and worsening pleural effusion. She was admitted to the hospitalist service and the heart failure team was consulted. Their plan was to treat medically with entresto and diuresis and consider ICD if EF remained low. Warfarin was held for possible thoracentesis.   In the AM hours of 8/26 she suffered a cardiac arrest. She became bradycardic and then PEA. Total of 3 arrests, total time around 30 mins. Defibrillated twice for VF during this episode. In the post code setting she remained in shock. She was posturing immediately post code as well.   Past Medical History   has a past medical history of Acute combined systolic and diastolic HF (heart failure) ([place]) (10/16/2018), Allergic rhinitis, Anxiety, Arthritis, CHF (congestive heart failure) ([place]), Chronic pain, Colitis (12/2017), Depression, Diabetes mellitus, Hyperglycemia, Hyperlipemia, Hypertension, Sickle cell trait ([place]), and Stroke ([place]) (09/05/14).   Significant Hospital Events   8/25 admit for abd distension, pleural effusoin, NSVT  Consults:  Cardiology  Procedures:  ETT 8/26 >  Significant Diagnostic Tests:  Echo 8/26 >>>  US abdomen 8/25 > Findings most consistent with cirrhosis. Moderate volume ascites.  Micro Data:  COVID neg  Antimicrobials:    Interim history/subjective:    Objective   Blood pressure (!) 74/15, pulse (!) 25,  temperature 97.6 F (36.4 C), temperature source Oral, resp. rate (!) 25, height 5' 3.5" (1.613 m), weight 54 kg, SpO2 (!) 40 %.    Vent Mode: PRVC FiO2 (%):  [100 %] 100 % Set Rate:  [22 bmp-30 bmp] 30 bmp Vt Set:  [400 mL-420 mL] 420 mL PEEP:  [5 cmH20] 5 cmH20 Plateau Pressure:  [34 cmH20] 34 cmH20   Intake/Output Summary (Last 24 hours) at 2019-08-09 0631 Last data filed at August 09, 2019 0600 Gross per 24 hour  Intake 644.83 ml  Output 3 ml  Net 641.83 ml   Filed Weights   07/20/19 0400 07/20/19 1350  Weight: 56.4 kg 54 kg    Examination: General: frail elderly appearing female on vent HENT: [place]/AT, pupils 5 mm and fixed Lungs: diminished R Cardiovascular: Tachy, regular, no MRG Abdomen: Distended, taut, hypoactive.  Extremities: no acute deformity Neuro: unresponsive flexor posturing GU: foley  Resolved Hospital Problem list     Assessment & Plan:   Cardiac arrest: Etiology unclear. AM labs pending. Initially PEA, but was defibrillated x 2 for VF at later stages of arrest.  Posturing on exam.  - Transfer to ICU - Not stable to consider TTM - Echo pending - CHF service following - Husband endorses full code.  - EEG to rule out seizure, suspect his is myoclonus - Poor prognosis, DNR should she arrest again. Discussion with myself, Dr. Gilford Raid, Husband, and sister.   Cardiogenic shock: known LVEF 20-25%. Looks worse on bedside echo post arrest.  - ICU hemodynamic monitoring - Levophed for MAP goal 65 - Place CVL,  arterial line - ensure lactic clearing  AKI - follow BMP - poor candidate for HD should she progress - KVO, volume overloaded.   Acute on chronic systolic CHF - advanced CHF service following  Pleural effusion causing complete opacification of R hemithorax. CHF vs hepatic hydrothorax vs hemothorax.  - Not stable for thora  Update 0750 - patient becoming bradycardic to 40s. Arrest imminent. Patient now DNR. Family brought to bedside.    Labs    CBC: Recent Labs  Lab 06/27/2019 1833 07/20/19 0420 07-28-2019 0522 07/28/19 0618  WBC 7.7 7.2 15.4*  --   NEUTROABS  --  5.7  --   --   HGB 13.1 12.1 9.4* 7.5*  HCT 39.2 36.4 28.5* 22.0*  MCV 66.4* 67.0* 69.0*  --   PLT 290 273 214  --     Basic Metabolic Panel: Recent Labs  Lab 07/06/2019 1833 07/20/19 0131 07/20/19 0420 July 28, 2019 0522 07/28/19 0525 28-Jul-2019 0618  NA 134*  --  137  --  142 144  K 4.0  --  4.0  --  3.3* 3.5  CL 98  --  101  --  101  --   CO2 24  --  27  --  25  --   GLUCOSE 234*  --  195*  --  174*  --   BUN 11  --  14  --  18  --   CREATININE 1.04*  --  1.21*  --  1.87*  --   CALCIUM 8.8*  --  8.1*  --  7.9*  --   MG  --  1.7 1.6* 2.4  --   --    GFR: Estimated Creatinine Clearance: 25.7 mL/min (A) (by C-G formula based on SCr of 1.87 mg/dL (H)). Recent Labs  Lab 07/02/2019 1833 07/20/19 0420 07/28/2019 0522 2019-07-28 0525  WBC 7.7 7.2 15.4*  --   LATICACIDVEN  --   --   --  8.2*    Liver Function Tests: Recent Labs  Lab 06/29/2019 1833 07/20/19 0420 28-Jul-2019 0525  AST 72* 70* 122*  ALT 44 39 60*  ALKPHOS 345* 317* 262*  BILITOT 2.3* 2.3* 1.6*  PROT 6.2* 5.8* 4.4*  ALBUMIN 2.4* 2.1* 1.6*   Recent Labs  Lab 06/29/2019 1833  LIPASE 25   No results for input(s): AMMONIA in the last 168 hours.  ABG    Component Value Date/Time   PHART 7.411 10/14/2018 1057   PCO2ART 45.5 10/14/2018 1057   PO2ART 85.0 10/14/2018 1057   HCO3 27.0 07-28-19 0618   TCO2 29 07/28/2019 0618   ACIDBASEDEF 2.0 2019/07/28 0618   O2SAT 37.0 July 28, 2019 0618     Coagulation Profile: Recent Labs  Lab 07/20/19 0420 2019/07/28 0522  INR 1.2 2.0*    Cardiac Enzymes: No results for input(s): CKTOTAL, CKMB, CKMBINDEX, TROPONINI in the last 168 hours.  HbA1C: Hgb A1c MFr Bld  Date/Time Value Ref Range Status  07/20/2019 04:20 AM 7.3 (H) 4.8 - 5.6 % Final    Comment:    (NOTE)         Prediabetes: 5.7 - 6.4         Diabetes: >6.4         Glycemic control for  adults with diabetes: <7.0   12/17/2018 05:21 AM 8.6 (H) 4.8 - 5.6 % Final    Comment:    (NOTE) Pre diabetes:          5.7%-6.4% Diabetes:              >  6.4% Glycemic control for   <7.0% adults with diabetes     CBG: Recent Labs  Lab 07/20/19 1247 07/20/19 1638 07/20/19 2048 2019-07-25 0513 July 25, 2019 0607  GLUCAP 163* 131* 119* 108* 115*    Review of Systems:   unable  Past Medical History  She,  has a past medical history of Acute combined systolic and diastolic HF (heart failure) ([place]) (10/16/2018), Allergic rhinitis, Anxiety, Arthritis, CHF (congestive heart failure) ([place]), Chronic pain, Colitis (12/2017), Depression, Diabetes mellitus, Hyperglycemia, Hyperlipemia, Hypertension, Sickle cell trait ([place]), and Stroke ([place]) (09/05/14).   Surgical History    Past Surgical History:  Procedure Laterality Date  . BIOPSY  09/23/2018   Procedure: BIOPSY;  Surgeon: Yetta Flock, MD;  Location: [facility] ENDOSCOPY;  Service: Gastroenterology;;  . ESOPHAGOGASTRODUODENOSCOPY (EGD) WITH PROPOFOL N/A 09/23/2018   Procedure: ESOPHAGOGASTRODUODENOSCOPY (EGD) WITH PROPOFOL;  Surgeon: Yetta Flock, MD;  Location: [facility] ENDOSCOPY;  Service: Gastroenterology;  Laterality: N/A;  . RIGHT/LEFT HEART CATH AND CORONARY ANGIOGRAPHY N/A 10/14/2018   Procedure: RIGHT/LEFT HEART CATH AND CORONARY ANGIOGRAPHY;  Surgeon: Troy Sine, MD;  Location: [place] CV LAB;  Service: Cardiovascular;  Laterality: N/A;  . TEE WITHOUT CARDIOVERSION N/A 09/06/2014   Procedure: TRANSESOPHAGEAL ECHOCARDIOGRAM (TEE);  Surgeon: Pixie Casino, MD;  Location: [facility] ENDOSCOPY;  Service: Cardiovascular;  Laterality: N/A;     Social History   reports that she has never smoked. She has never used smokeless tobacco. She reports that she does not drink alcohol or use drugs.   Family History   Her family history includes Diabetes in her father and mother; Heart attack in her father; Heart disease in her father;  Hypertension in her brother, daughter, father, mother, and sister; Kidney disease in her brother and sister; Liver cancer in her sister; Ovarian cancer in her sister; Schizophrenia in her daughter; Sickle cell anemia in her daughter. There is no history of Stroke, Esophageal cancer, Colon cancer, or Colon polyps.   Allergies Allergies  Allergen Reactions  . Metformin And Related Nausea And Vomiting  . Lipitor [Atorvastatin] Other (See Comments)    Stomach pains     Home Medications  Prior to Admission medications   Medication Sig Start Date End Date Taking? Authorizing Provider  acetaminophen (TYLENOL) 500 MG tablet Take 500 mg by mouth every 6 (six) hours as needed for mild pain or headache.   Yes [provider]  ASPIRIN LOW DOSE 81 MG EC tablet Take 1 tablet (81 mg total) by mouth daily. Patient taking differently: Take 81 mg by mouth daily.  01/22/18  Yes Hilty, Nadean Corwin, MD  Docusate Sodium (CORRECTOL EXTRA GENTLE PO) Take 1 capsule by mouth daily.   Yes [provider]  furosemide (LASIX) 40 MG tablet Take 1 tablet (40 mg total) by mouth 2 (two) times daily. Take the second dose around 3 PM 06/13/19  Yes Bonnell Public, MD  guaiFENesin (ROBITUSSIN) 100 MG/5ML SOLN Take 15 mLs (300 mg total) by mouth every 6 (six) hours as needed for cough. 06/13/19  Yes Bonnell Public, MD  hydrocortisone (ANUSOL-HC) 2.5 % rectal cream Apply 1 application topically 4 (four) times daily as needed for hemorrhoids. 06/13/19  Yes Dana Allan I, MD  insulin glargine (LANTUS) 100 unit/mL SOPN Inject 15 Units into the skin at bedtime.   Yes [provider]  ivabradine (CORLANOR) 5 MG TABS tablet Take 1 tablet (5 mg total) by mouth 2 (two) times daily with a meal. Patient taking differently: Take 5  mg by mouth daily.  12/22/18  Yes Eugenie Filler, MD  loratadine (CLARITIN) 10 MG tablet Take 1 tablet (10 mg total) by mouth daily as needed for allergies. 06/13/19  Yes  Dana Allan I, MD  metoprolol succinate (TOPROL-XL) 100 MG 24 hr tablet Take 1 tablet (100 mg total) by mouth every morning. Take with or immediately following a meal. In the Morning Patient taking differently: Take 100 mg by mouth every morning. Take with or immediately following a meal.  In the Morning 05/07/19  Yes Hilty, Nadean Corwin, MD  metoprolol succinate (TOPROL-XL) 50 MG 24 hr tablet Take 1 tablet (50 mg total) by mouth at bedtime. Take with or immediately following a meal. <PLEASE MAKE APPOINTMENT FOR REFILLS> Patient taking differently: Take 25 mg by mouth every evening. Take with or immediately following a meal. <PLEASE MAKE APPOINTMENT FOR REFILLS> 05/07/19  Yes Hilty, Nadean Corwin, MD  pantoprazole (PROTONIX) 40 MG tablet Take 1 tablet (40 mg total) by mouth daily with supper. Patient taking differently: Take 40 mg by mouth daily as needed (indigestion).  06/04/19  Yes Hilty, Nadean Corwin, MD  polyethylene glycol (MIRALAX / GLYCOLAX) packet Take 17 g by mouth daily as needed. Patient taking differently: Take 17 g by mouth daily as needed for mild constipation.  12/22/18  Yes Eugenie Filler, MD  polyvinyl alcohol (LIQUIFILM TEARS) 1.4 % ophthalmic solution Place 1 drop into both eyes as needed for dry eyes. 06/13/19  Yes Dana Allan I, MD  potassium chloride (K-DUR) 10 MEQ tablet Take 1 tablet (10 mEq total) by mouth daily. Patient taking differently: Take 5 mEq by mouth daily.  09/02/18  Yes Georgette Shell, MD  sacubitril-valsartan (ENTRESTO) 49-51 MG Take 1 tablet by mouth 2 (two) times daily. 06/21/19  Yes Hilty, Nadean Corwin, MD  warfarin (COUMADIN) 3 MG tablet Take 1 tablet (3 mg total) by mouth one time only at 6 PM. 06/13/19 07/20/19 Yes Ogbata, Babs Bertin, MD  Acetaminophen 500 MG coapsule Take 1 capsule (500 mg total) by mouth daily as needed. Patient not taking: Reported on 07/20/2019 06/13/19   Dana Allan I, MD  docusate sodium (COLACE) 100 MG capsule Take 1 capsule  (100 mg total) by mouth 2 (two) times daily as needed for mild constipation. Patient not taking: Reported on 07/20/2019 10/16/18   Isaiah Serge, NP  phenol (CHLORASEPTIC) 1.4 % LIQD Use as directed 1 spray in the mouth or throat as needed for throat irritation / pain. Patient not taking: Reported on 07/20/2019 06/13/19   Dana Allan I, MD  sucralfate (CARAFATE) 1 GM/10ML suspension Take 10 mLs (1 g total) by mouth 4 (four) times daily -  with meals and at bedtime for 10 days. Patient not taking: Reported on 06/12/2019 10/19/18 12/30/18  Long, Wonda Olds, MD     CRITICAL CARE Performed by: Corey Harold   Total critical care time: 90 minutes  Critical care time was exclusive of separately billable procedures and treating other patients.  Critical care was necessary to treat or prevent imminent or life-threatening deterioration.  Critical care was time spent personally by me on the following activities: development of treatment plan with patient and/or surrogate as well as nursing, discussions with consultants, evaluation of patient's response to treatment, examination of patient, obtaining history from patient or surrogate, ordering and performing treatments and interventions, ordering and review of laboratory studies, ordering and review of radiographic studies, pulse oximetry and re-evaluation of patient's condition.    Eddie Dibbles  [place] [place] Pager [phone] or [phone]  August 18, 2019 7:49 AM

## 2019-07-27 NOTE — Significant Event (Addendum)
RN found pt with bradycardia and pt went into PEA arrest. Code Blue was called and code team responded. ACLS performed. Pt had multiple epinephrine doses, bicarb, adenosine, and was shocked once for Vfib. She had ROSC. Placed on Levophed for low BP. PCCM called and informed about pt coming to ICU.  KJKG, NP [facility] Addendum: About 8 mins after arriving in ICU the pt brady'd down to the 40s and did not have a pulse. CPR was started immediately with ACLS. Pt received 4 Epis, an amp of bicarb, and was shocked once for VFib. ROSC attained. PCCM here and taking over care.  NP talked to husband about pt's status and risk of asystole again given her issues. Explained grim prognosis. Husband still wants wife to be a FULL CODE.  Patrica Duel, NP [facility]

## 2019-07-27 NOTE — Progress Notes (Signed)
eLink Physician-Brief Progress Note Patient Name: Kristen Lozano DOB: 1956/05/17 MRN: [id number]   Date of Service  08-09-2019  HPI/Events of Note  Pt admitted 07/07/2019 with non-sustained ventricular tachycardia. Transferred to ICU after cardiac arrest on the floor earlier tonight, coded a second time on arrival to  ICU, she has a history of cardiomyopathy with heart failure and LV thrombus.  eICU Interventions  Code blue management, New patient evaluation.        [place] U Ogan 2019/08/09, 6:00 AM

## 2019-07-27 NOTE — Plan of Care (Signed)
  Problem: Metabolic: Goal: Ability to maintain appropriate glucose levels will improve Outcome: Progressing   Problem: Skin Integrity: Goal: Risk for impaired skin integrity will decrease Outcome: Progressing   Problem: Tissue Perfusion: Goal: Adequacy of tissue perfusion will improve Outcome: Progressing   Problem: Coping: Goal: Ability to adjust to condition or change in health will improve Outcome: Not Progressing   Problem: Health Behavior/Discharge Planning: Goal: Ability to manage health-related needs will improve Outcome: Not Progressing   Problem: Nutritional: Goal: Maintenance of adequate nutrition will improve Outcome: Not Progressing Goal: Progress toward achieving an optimal weight will improve Outcome: Not Progressing   Problem: Coping: Goal: Ability to identify and develop effective coping behavior will improve Outcome: Not Progressing   Problem: Self-Concept: Goal: Level of anxiety will decrease Outcome: Not Progressing Goal: Ability to modify response to factors that promote anxiety will improve Outcome: Not Progressing

## 2019-07-27 NOTE — Code Documentation (Signed)
  Patient Name: Kristen Lozano   MRN: [id number]   Date of Birth/ Sex: 06-Dec-1955 , female      Admission Date: 07/01/2019  Attending Provider: Tawni Millers,*  Primary Diagnosis: Non-sustained ventricular tachycardia [facility])   Indication: Pt was in her usual state of health until this 0458 AM, when she was noted to be unresponsive, brady down into asystole. Code blue was subsequently called. At the time of arrival on scene, ACLS protocol was underway.   Technical Description:  - CPR performance duration:  27 minutes  - Was defibrillation or cardioversion used? Yes - V fib during one pulse check   - Was external pacer placed? No  - Was patient intubated pre/post CPR? Yes   Medications Administered: Y = Yes; Blank = No Amiodarone    Atropine  y  Calcium    Epinephrine  y  Lidocaine    Magnesium    Norepinephrine    Phenylephrine    Sodium bicarbonate  y  Vasopressin     Post CPR evaluation:  - Final Status - Was patient successfully resuscitated ? Yes - What is current rhythm? Sinus Tachy  - What is current hemodynamic status? Stable   Miscellaneous Information:  - Labs sent, including: CBC, BMP, ABG, Lactic acid  - Primary team notified?  Yes  - Family Notified? Yes  - Additional notes/ transfer status: Patient was in SVT post code, given 6 mg adenosine. [facility] hospitalist at bedside. Signed out patient to PCCM.     Gifford Shave, MD  08/17/19, 5:28 AM

## 2019-07-27 NOTE — Progress Notes (Signed)
During the night patient complained about abdominal pain. Provider notified and KUB order to follow up with pain. Patient was at there baseline until about 0455 and HR dropped in 30's then PEA. Code blue and ACLS started. Code team arrived at bedside ROSC was achieved. Request for ICU bed. Patient remained unstable Levophed gtt started by rapid response RN per orders. ICU bed approved and patient transferred from 2c02 to [place] report at bedside given to [place]. Husband was at bedside and was supported by chaplain updates provider by MD.

## 2019-07-27 NOTE — Code Documentation (Signed)
  Patient Name: Kristen Lozano   MRN: [id number]   Date of Birth/ Sex: Jun 01, 1956 , female      Admission Date: 07/03/2019  Attending Provider: Tawni Millers,*  Primary Diagnosis: Non-sustained ventricular tachycardia [facility])   Indication: Pt was in her usual state of health until this 0548  AM, when she was noted to be PEA. Code blue was subsequently called. At the time of arrival on scene, ACLS protocol was underway.   Technical Description:  - CPR performance duration:  9 minute  - Was defibrillation or cardioversion used? Yes   - Was external pacer placed? No  - Was patient intubated pre/post CPR? Yes   Medications Administered: Y = Yes; Blank = No Amiodarone  y  Atropine    Calcium    Epinephrine  y  Lidocaine    Magnesium    Norepinephrine    Phenylephrine    Sodium bicarbonate  y  Vasopressin     Post CPR evaluation:  - Final Status - Was patient successfully resuscitated ? Yes - What is current rhythm? Sinus Tach  - What is current hemodynamic status? Stable  Miscellaneous Information:  - Labs sent, including: None - where collected after first code   - Primary team notified?  Yes  - Family Notified? Yes  - Additional notes/ transfer status: PCCM at bedside      Gifford Shave, MD  2019-07-22, 6:02 AM

## 2019-07-27 NOTE — Progress Notes (Signed)
Initial Nutrition Assessment  DOCUMENTATION CODES:   Not applicable  INTERVENTION:   If warranted and once enteral accesses if obtained:   Vital AF 1.2 @ 45 ml/hr (1080 ml)   Provides: 1296 kcals, 81 grams protein, 876 ml free water.   NUTRITION DIAGNOSIS:   Inadequate oral intake related to inability to eat as evidenced by NPO status.  GOAL:   Patient will meet greater than or equal to 90% of their needs  MONITOR:   Diet advancement, Vent status, TF tolerance, Weight trends, Labs, I & O's  REASON FOR ASSESSMENT:   Ventilator    ASSESSMENT:   Patient with PMH significant for CHF, CVA, HTN, DM, and sickle cell trait. Presents this admission with nonsustained V tach in setting of cardiomyopathy and worsening bilateral pleural effusions.   8/25- ultrasound consistent with cirrhosis with ascites   8/26- cardiac arrest x3, intubated   RD working remotely.  Pt not a candidate for TTM.  Requiring pressor support. Shows to be volume overloaded- not stable for thoracentesis. Noted to have poor prognosis made DNR. RD to leave TF recommendations.   Admission weight: 56.4 kg Current weight: 54 kg  Pt with severe malnutrition diagnosis in the past. Unable to diagnosis at this time without NFPE and history.   Patient is currently intubated on ventilator support MV: 12.4 L/min Temp (24hrs), Avg:97.7 F (36.5 C), Min:97.6 F (36.4 C), Max:97.8 F (36.6 C)   I/O: +642 ml since admit  Drips: levophed Medications: 40 mg lasix BID, SS novolog, lantus, 20 mEq KCl daily  Labs: Phosphorus 8.1 (H) LFTs elevated CBG 119-234  Diet Order:   Diet Order            Diet heart healthy/carb modified Room service appropriate? Yes; Fluid consistency: Thin; Fluid restriction: 1200 mL Fluid  Diet effective now              EDUCATION NEEDS:   Not appropriate for education at this time  Skin:  Skin Assessment: Reviewed RN Assessment  Last BM:  8/23  Height:   Ht Readings  from Last 1 Encounters:  04-Aug-2019 5\' 3"  (1.6 m)    Weight:   Wt Readings from Last 1 Encounters:  08-04-19 54 kg    Ideal Body Weight:  54.5 kg  BMI:  Body mass index is 21.09 kg/m.  Estimated Nutritional Needs:   Kcal:  1292 kcal  Protein:  75-95 grams  Fluid:  >/= 1.3 L/day   Mariana Single RD, LDN Clinical Nutrition Pager # - [phone]

## 2019-07-27 NOTE — Progress Notes (Addendum)
[id number] Patient arrived from ER without notice or report.  [id number] PEA arrest  CPR  4 Epi 1 Bicarb  V-fib 1 Shock  ROSC 0557  0645 PEA Arrest  CPR  1 Epi  ROSC [id number]

## 2019-07-27 NOTE — Transfer of Care (Signed)
OOD intubation 

## 2019-07-27 DEATH — deceased

## 2019-08-26 NOTE — Death Summary Note (Signed)
Kristen Lozano was a 63 y.o. female admitted on 06/30/2019 with nausea and vomiting.  Noted to have episodes of NSVT and pleural effusion.  Seen by heart failure team.  She developed cardiac arrest on 8/26 with PEA and then ventricular fibrillation.  She had multiple episodes of cardiac arrest.  Family updated, and decided against further resuscitative measures.  She expired on 07/28/19.  Final diagnoses: PEA Cardiac arrest Ventricular fibrillation Acute on chronic systolic and diastolic CHF Cardiogenic shock Acute kidney injury from ATN Acute hypoxic respiratory failure Non sustained ventricular tachycardia History of CVA History of Left Ventricular thrombus Diabetes mellitus type 2 Acute on chronic right pleural effusion History of sickle cell trait Metabolic acidosis with lactic acidosis Hypokalemia Hypocalcemia Anemia of critical illness and chronic disease   Chesley Mires, MD [place] 08/03/2019, 3:50 PM

## 2019-10-01 ENCOUNTER — Ambulatory Visit: Payer: Medicare HMO | Admitting: Internal Medicine

## 2019-12-22 ENCOUNTER — Other Ambulatory Visit (HOSPITAL_COMMUNITY): Payer: Medicare HMO

## 2020-07-19 IMAGING — CT CT ABD-PELV W/ CM
2 of 5 series · 14 of 46 positions shown, 16 images · IV contrast (Omni 300)
Comparison: 01/09/2018 and 09/07/2014

CLINICAL DATA: Abdominal pain with nausea, vomiting and diarrhea 3
days.

EXAM:
CT ABDOMEN AND PELVIS WITH CONTRAST
TECHNIQUE: Multidetector CT imaging of the abdomen and pelvis was performed
using the standard protocol following bolus administration of
intravenous contrast.
CONTRAST:  100mL OMNIPAQUE IOHEXOL 300 MG/ML  SOLN

[Series 3: a/p w/ 5mm · axial · 0.60mm/px · z∈[+716,+1152]mm · 11 of 99 slices shown, 13 images]
[im 6/99  soft-tissue]
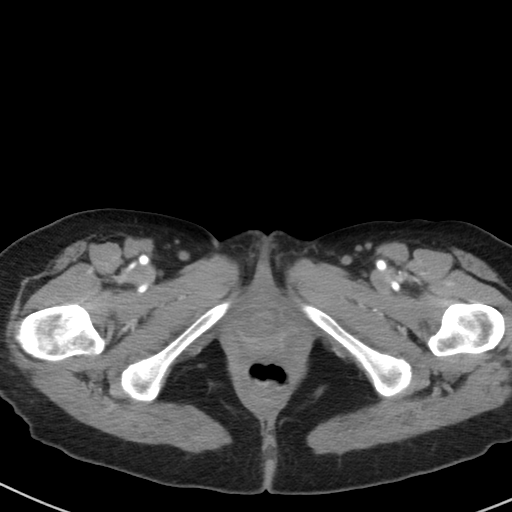
[im 6/99  bone]
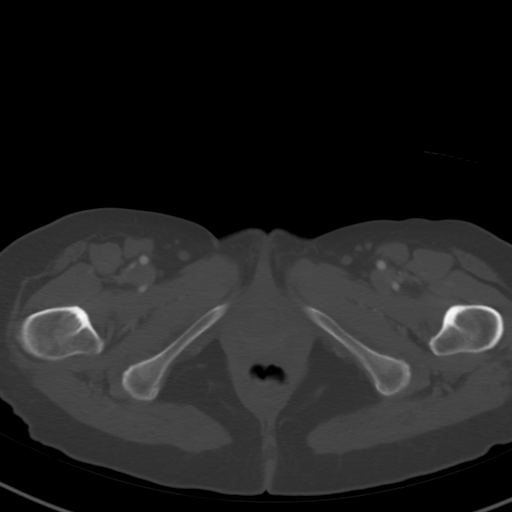
[im 16/99  soft-tissue]
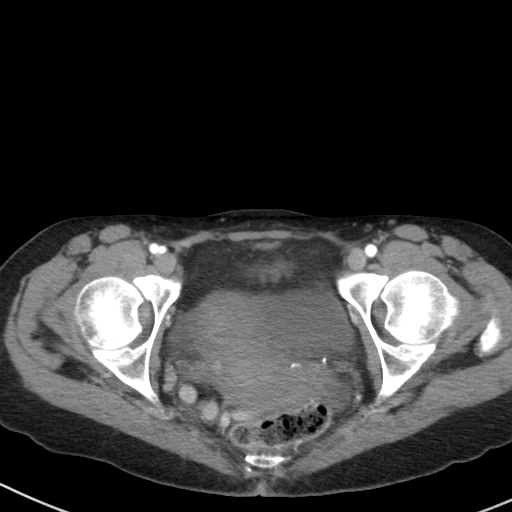
[im 26/99  soft-tissue]
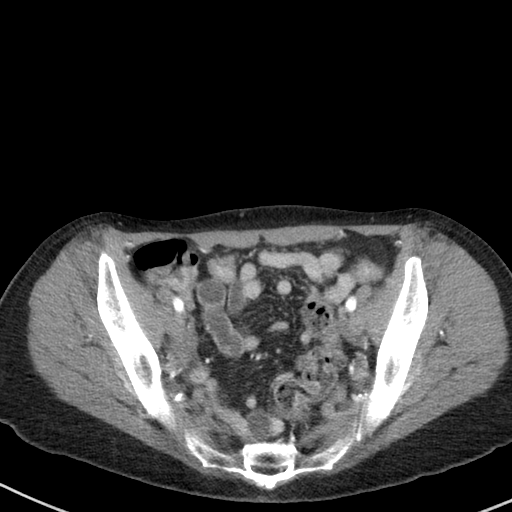
[im 31/99  soft-tissue]
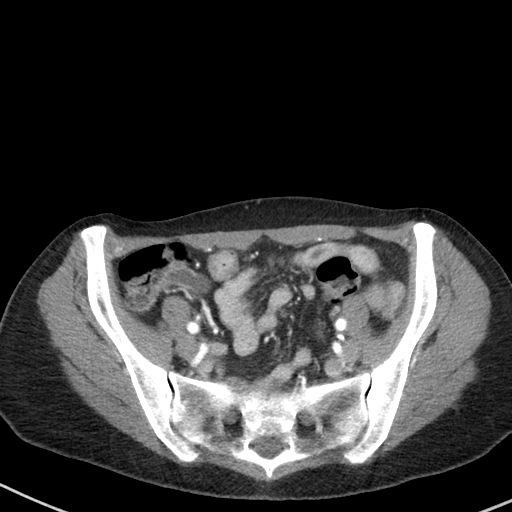
[im 42/99  soft-tissue]
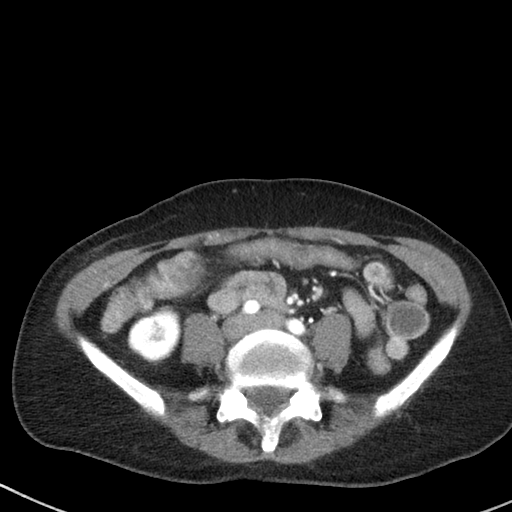
[im 52/99  soft-tissue]
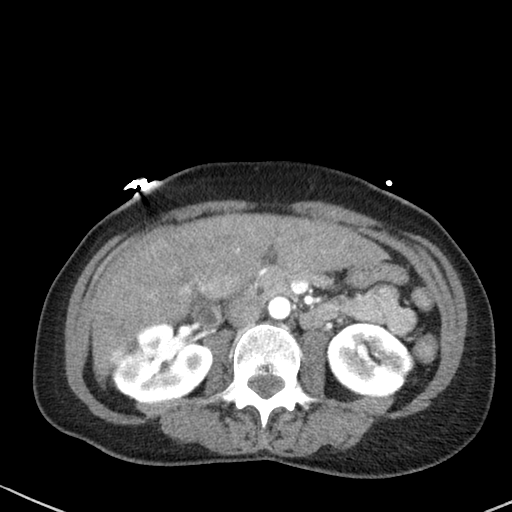
[im 57/99  soft-tissue]
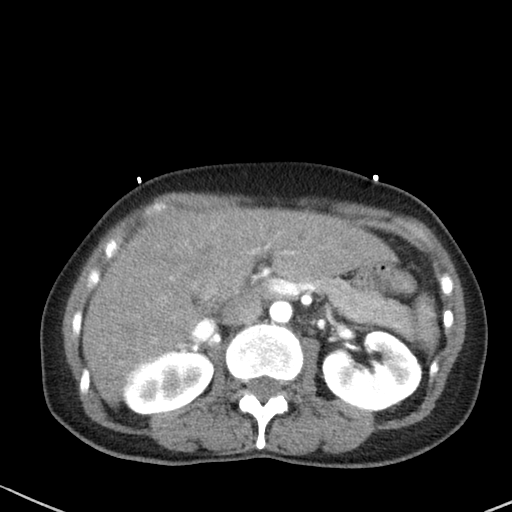
[im 68/99  soft-tissue]
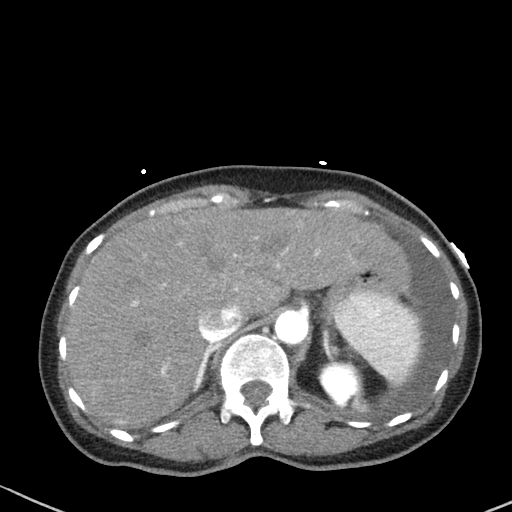
[im 73/99  soft-tissue]
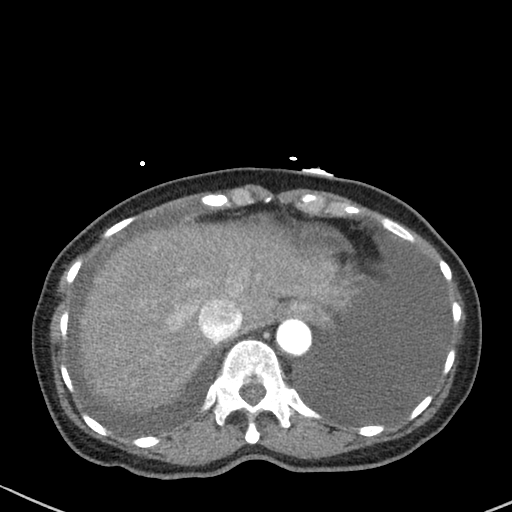
[im 73/99  bone]
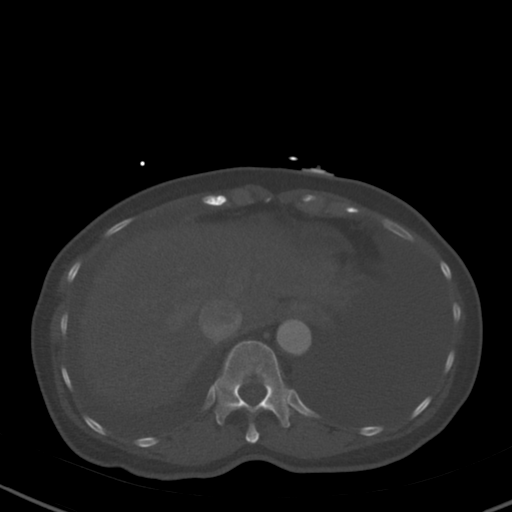
[im 83/99  soft-tissue]
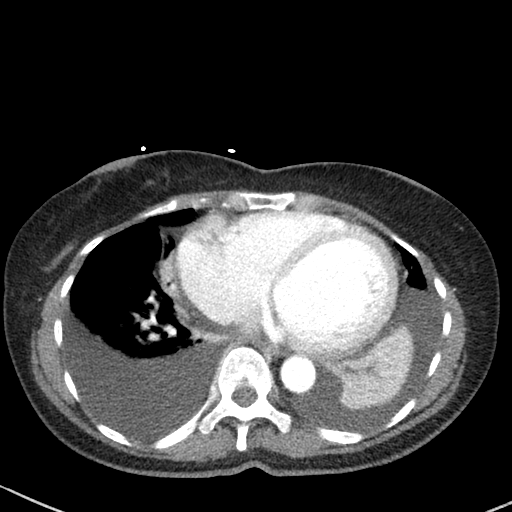
[im 93/99  soft-tissue]
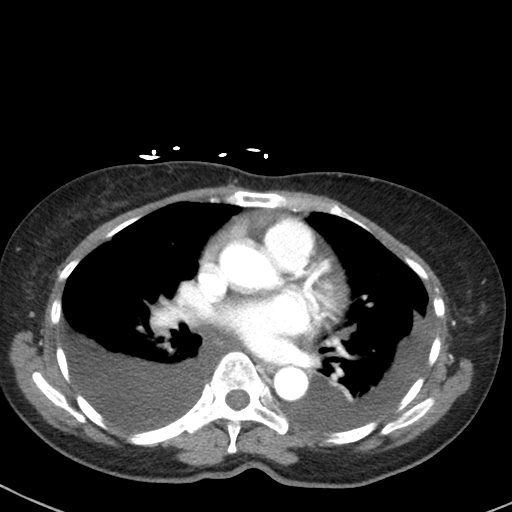

[Series 6: a/p w/ cor · coronal · 0.54mm/px · 3 of 151 slices shown]
[im 51/151  soft-tissue]
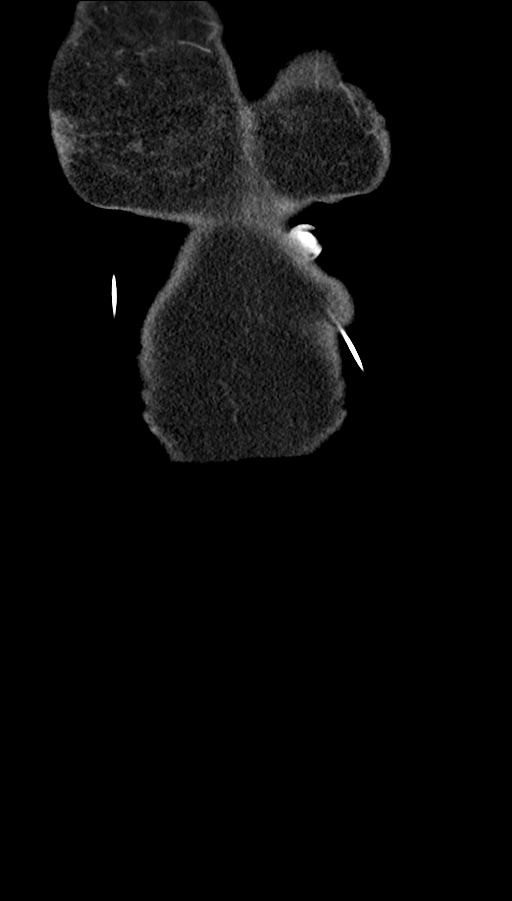
[im 67/151  soft-tissue]
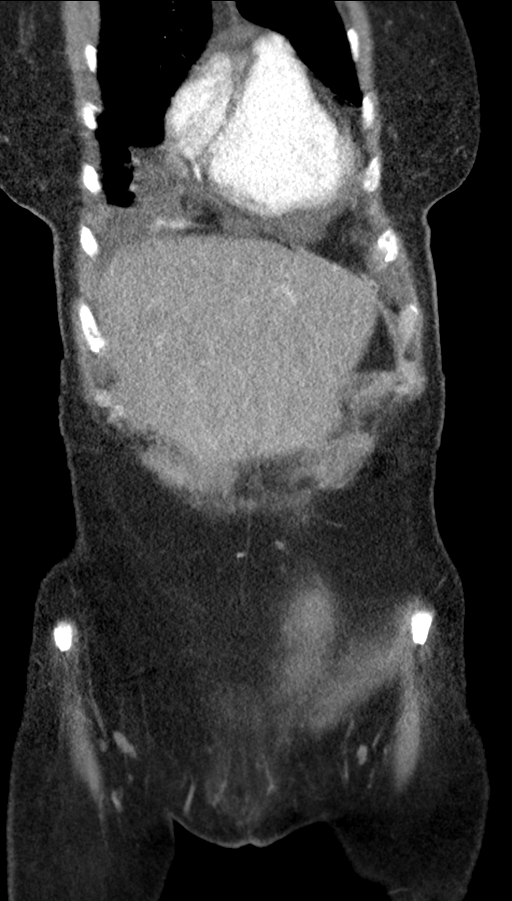
[im 84/151  soft-tissue]
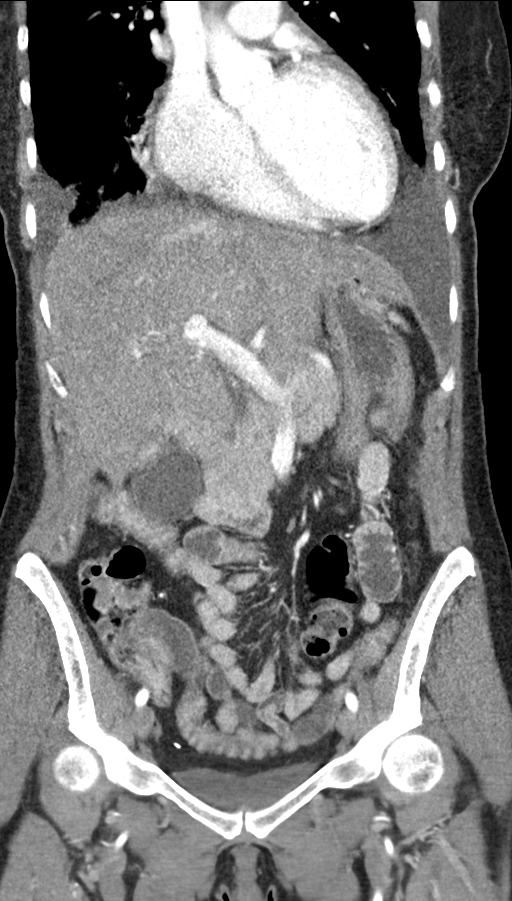

[14 of 46 positions shown; findings below may reference images not displayed]

FINDINGS: Lower chest: Examination demonstrates moderate size bilateral
pleural effusions with associated compressive atelectasis in the
lung bases. Mild cardiomegaly.

Hepatobiliary: Mild diffuse low-attenuation of the liver without
focal mass. Gallbladder and biliary tree are normal.

Pancreas: Normal.

Spleen: Normal.

Adrenals/Urinary Tract: Adrenal glands are normal. Kidneys are
normal in size with patchy bilateral renal cortical scarring. Few
small subcentimeter bilateral cortical hypodensities too small to
characterize but likely cysts. No hydronephrosis or nephrolithiasis.
Bladder and ureters are normal..

Stomach/Bowel: Stomach is within normal. Mild wall thickening of
several small bowel loops over the left lower quadrant which may be
due to regional enteritis of infectious or inflammatory nature. No
bowel obstruction.. Appendix is normal. Mild decompression of the
transverse and descending colon as the colon is otherwise
unremarkable.

Vascular/Lymphatic: Normal.

Reproductive: Normal.

Other: No free fluid or focal inflammatory change.

Musculoskeletal: Unremarkable.
IMPRESSION: Few small bowel loops over the left lower quadrant with wall
thickening which may be due to a regional enteritis of infectious or
inflammatory nature. No bowel obstruction.

Moderate size bilateral pleural effusions with bibasilar
atelectasis.

Bilateral renal cortical scarring. Couple small subcentimeter
bilateral renal cortical hypodensities too small to characterize but
likely cysts.

Attic steatosis.

Mild cardiomegaly.

## 2020-08-31 IMAGING — US US ABDOMEN LIMITED
1 series · 14 of 25 positions shown · non-contrast
Comparison: CT 09/01/2018

CLINICAL DATA: Abdominal pain with vomiting and elevated LFTs as
well as elevated bilirubin.

EXAM:
ULTRASOUND ABDOMEN LIMITED RIGHT UPPER QUADRANT

[Series 1: us abdomen limited · 14 of 51 slices shown]
[im 1/51]
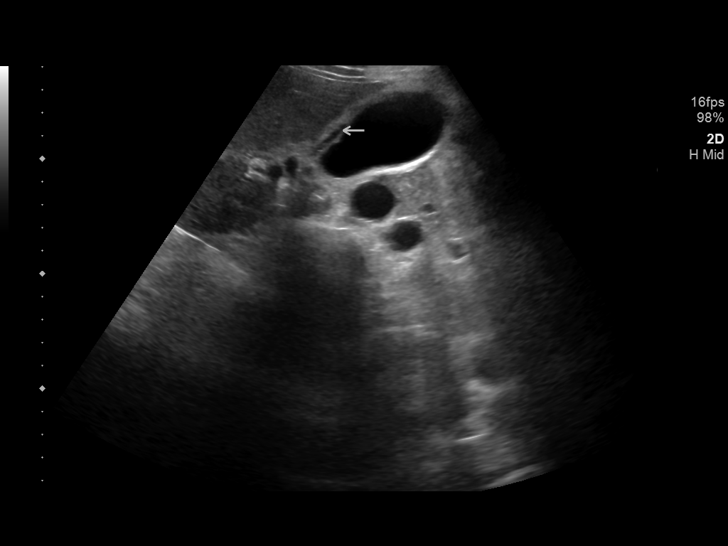
[im 5/51]
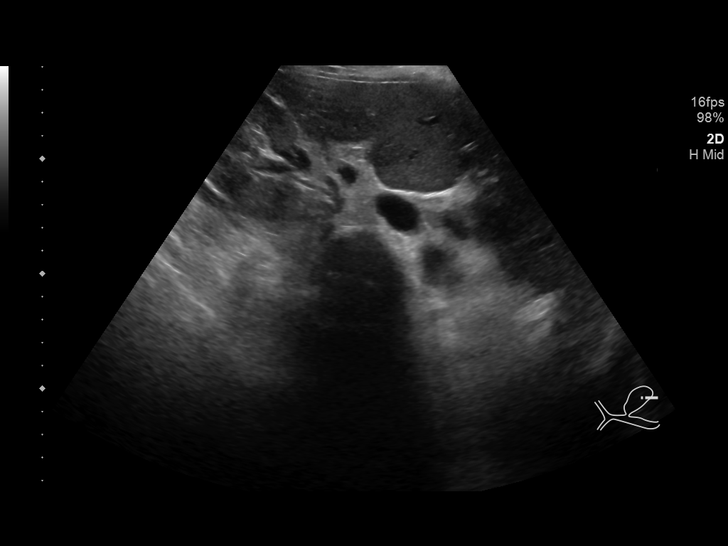
[im 9/51]
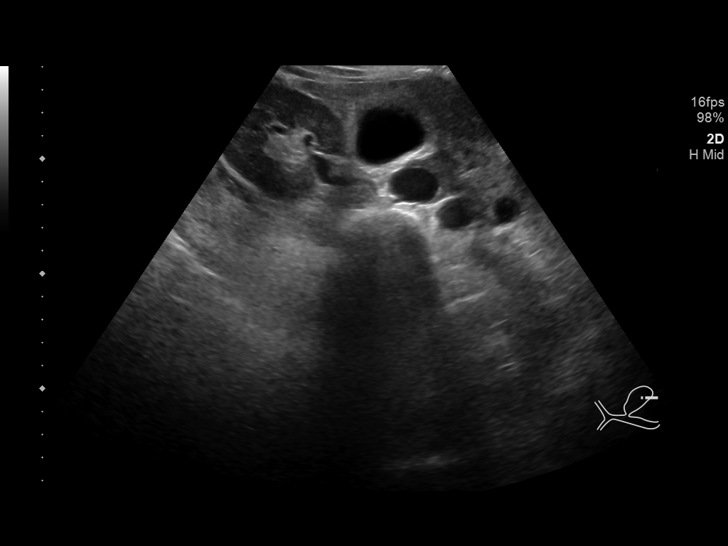
[im 13/51]
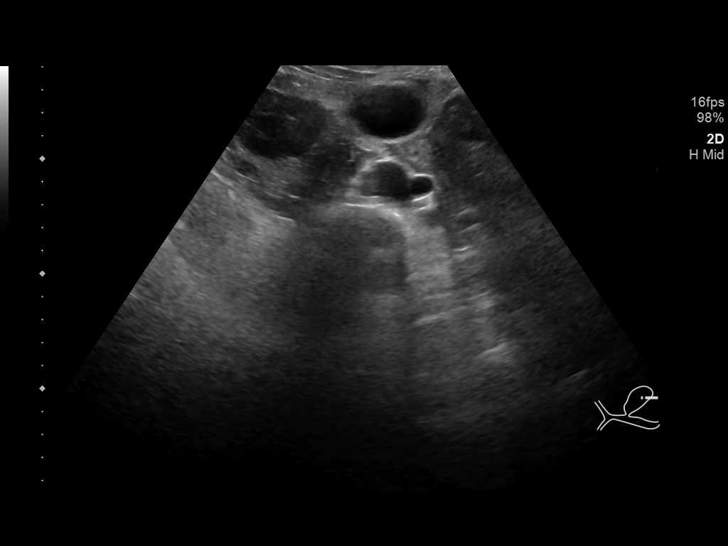
[im 17/51]
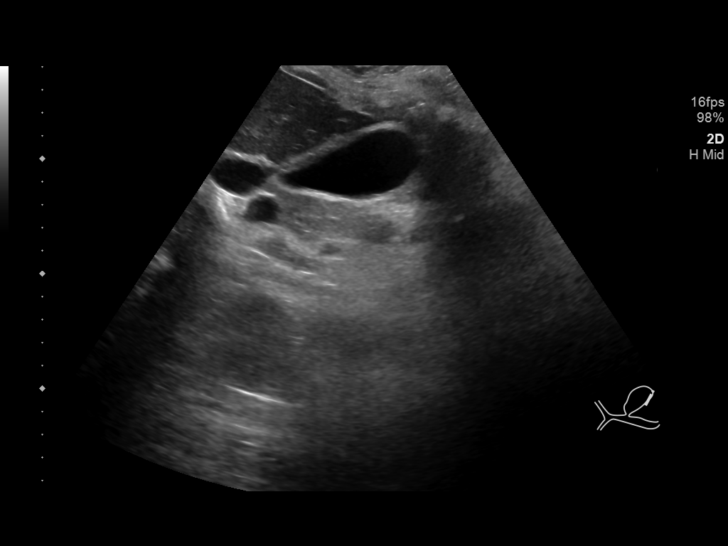
[im 19/51]
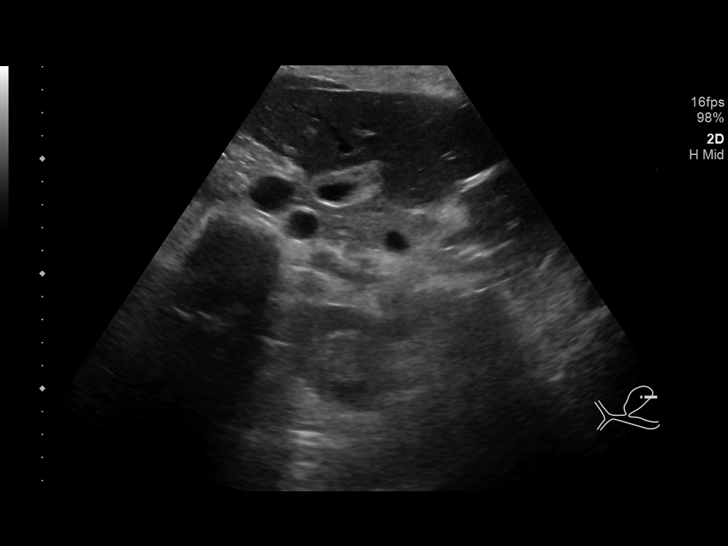
[im 23/51]
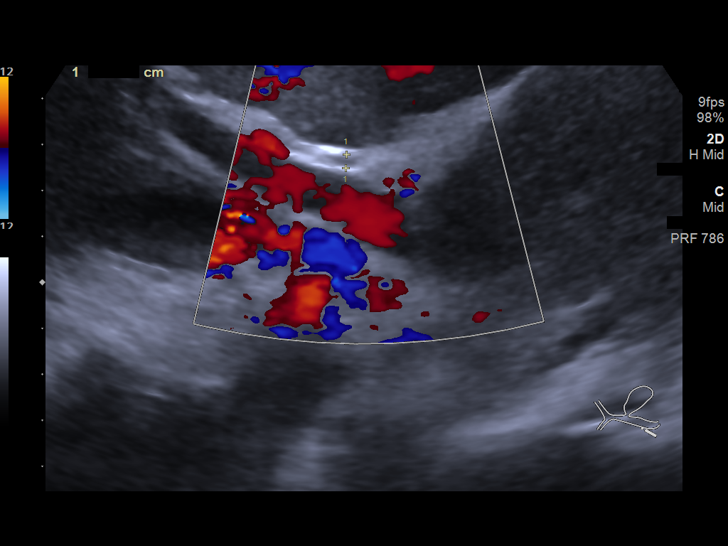
[im 28/51]
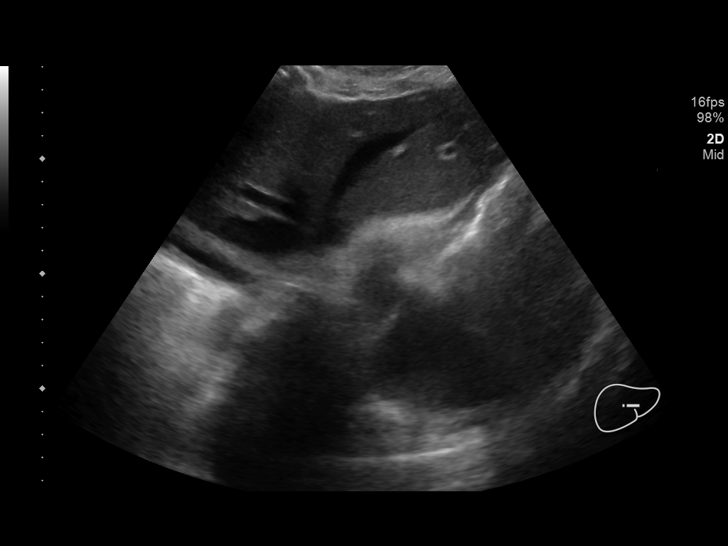
[im 32/51]
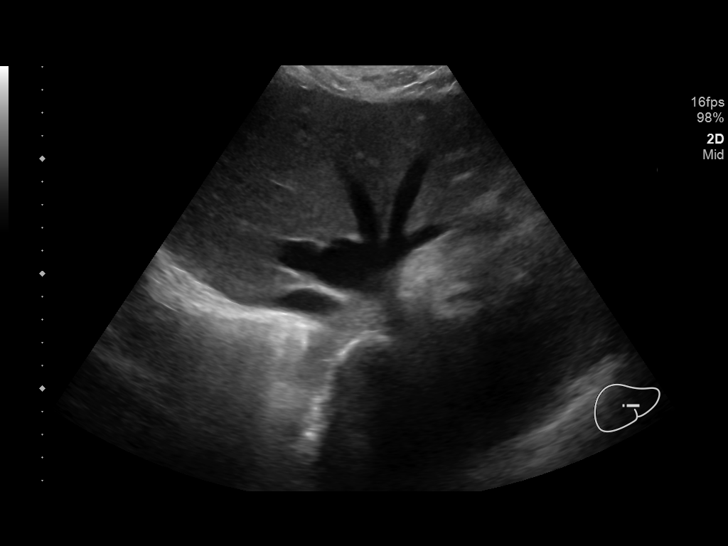
[im 34/51]
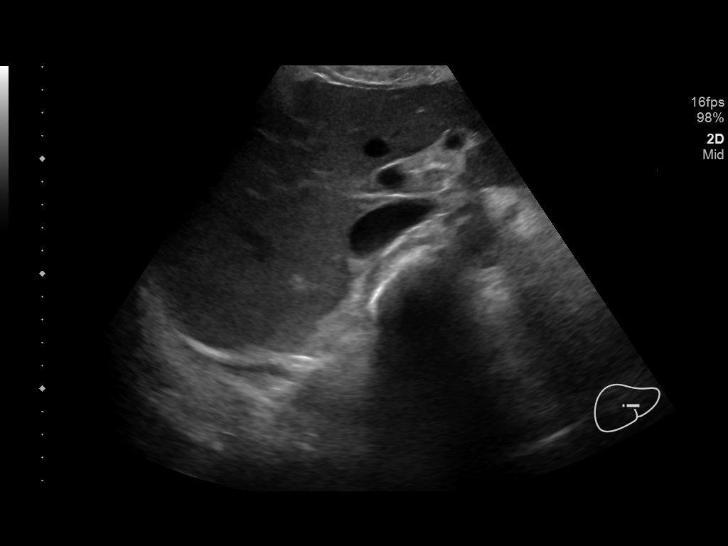
[im 38/51]
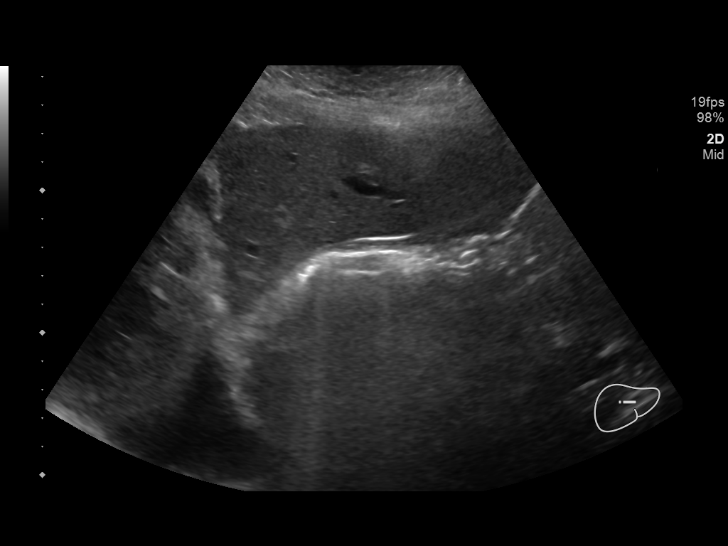
[im 42/51]
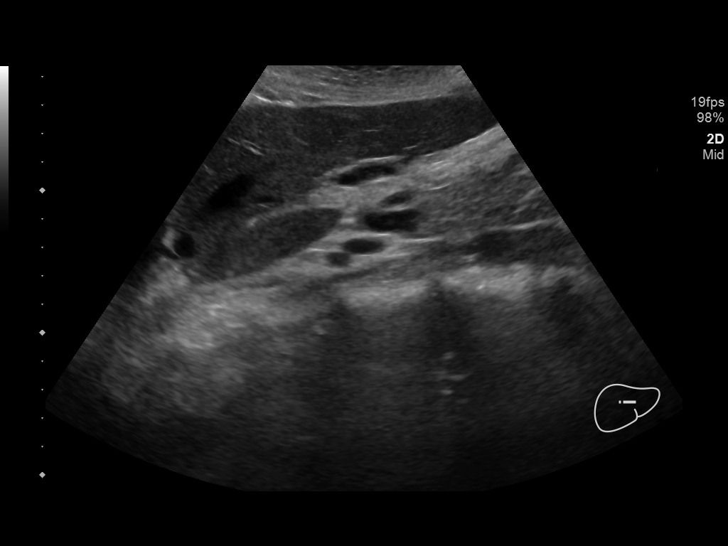
[im 46/51]
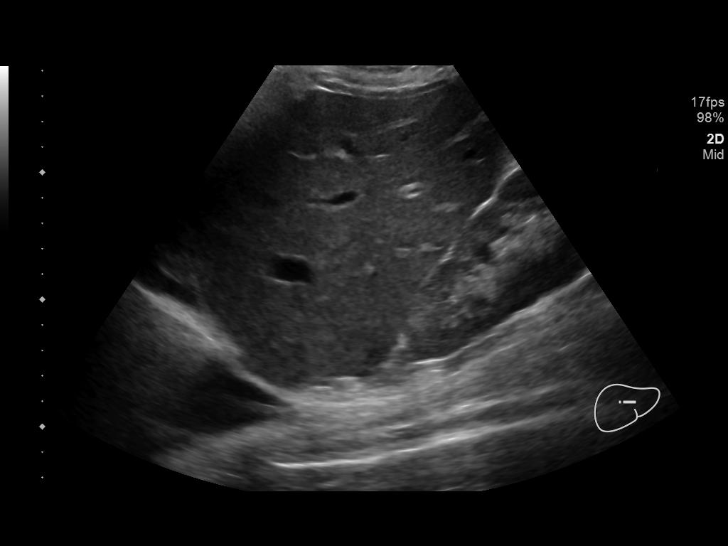
[im 51/51]
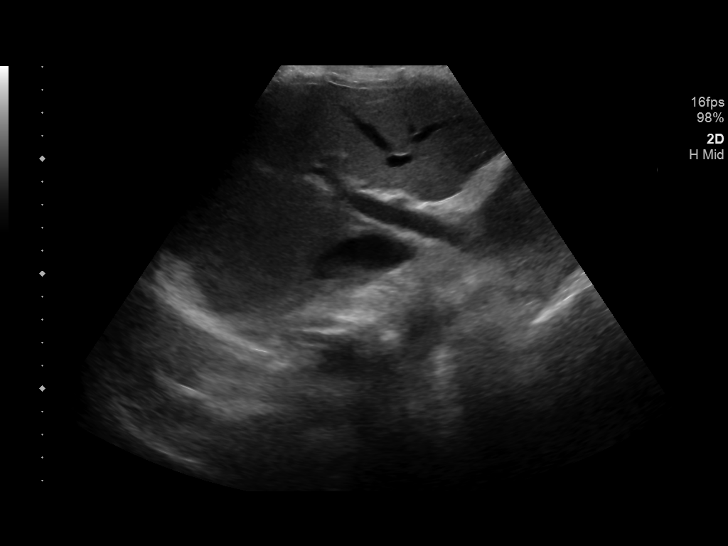

[14 of 25 positions shown; findings below may reference images not displayed]

FINDINGS: Gallbladder:

No evidence of gallstones or sludge. Mild wall thickening measuring
5.7 mm. Possible tiny amount of pericholecystic fluid.

Common bile duct:

Diameter: 3.1 mm.

Liver:

No focal lesion identified. Within normal limits in parenchymal
echogenicity. Portal vein is patent on color Doppler imaging with
normal direction of blood flow towards the liver.

Right pleural effusion.
IMPRESSION: Nonspecific gallbladder wall thickening. No evidence of gallstones
or sludge. HIDA scan may be helpful to assess gallbladder function.

Normal liver and biliary tree.

Right pleural effusion.

## 2020-10-31 IMAGING — DX DG CHEST 2V
2 series · 2 of 2 positions shown · non-contrast
Comparison: Chest radiograph 10/25/2018

CLINICAL DATA: Patient with nausea, vomiting and diarrhea

EXAM:
CHEST - 2 VIEW

[chest lat]
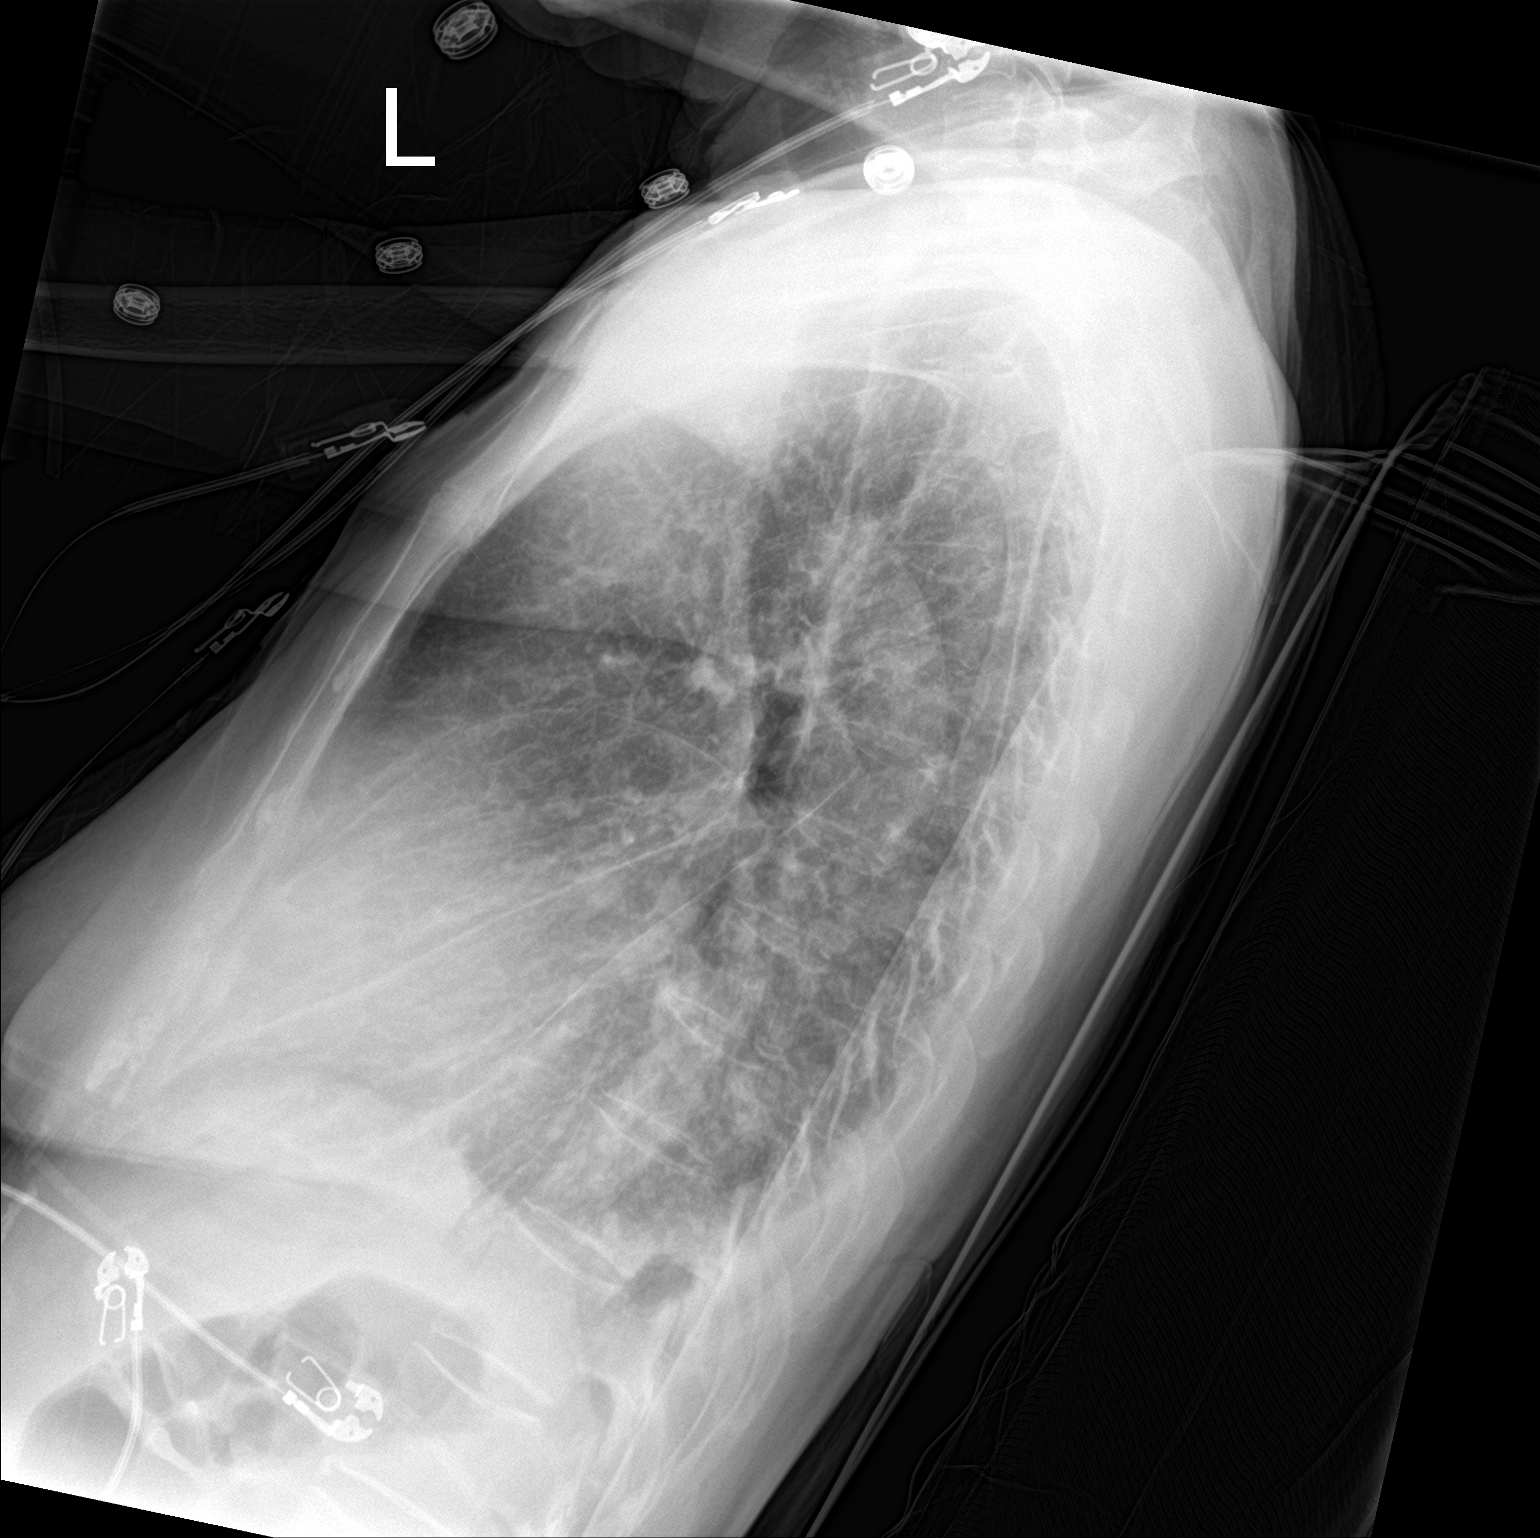

[chest ap]
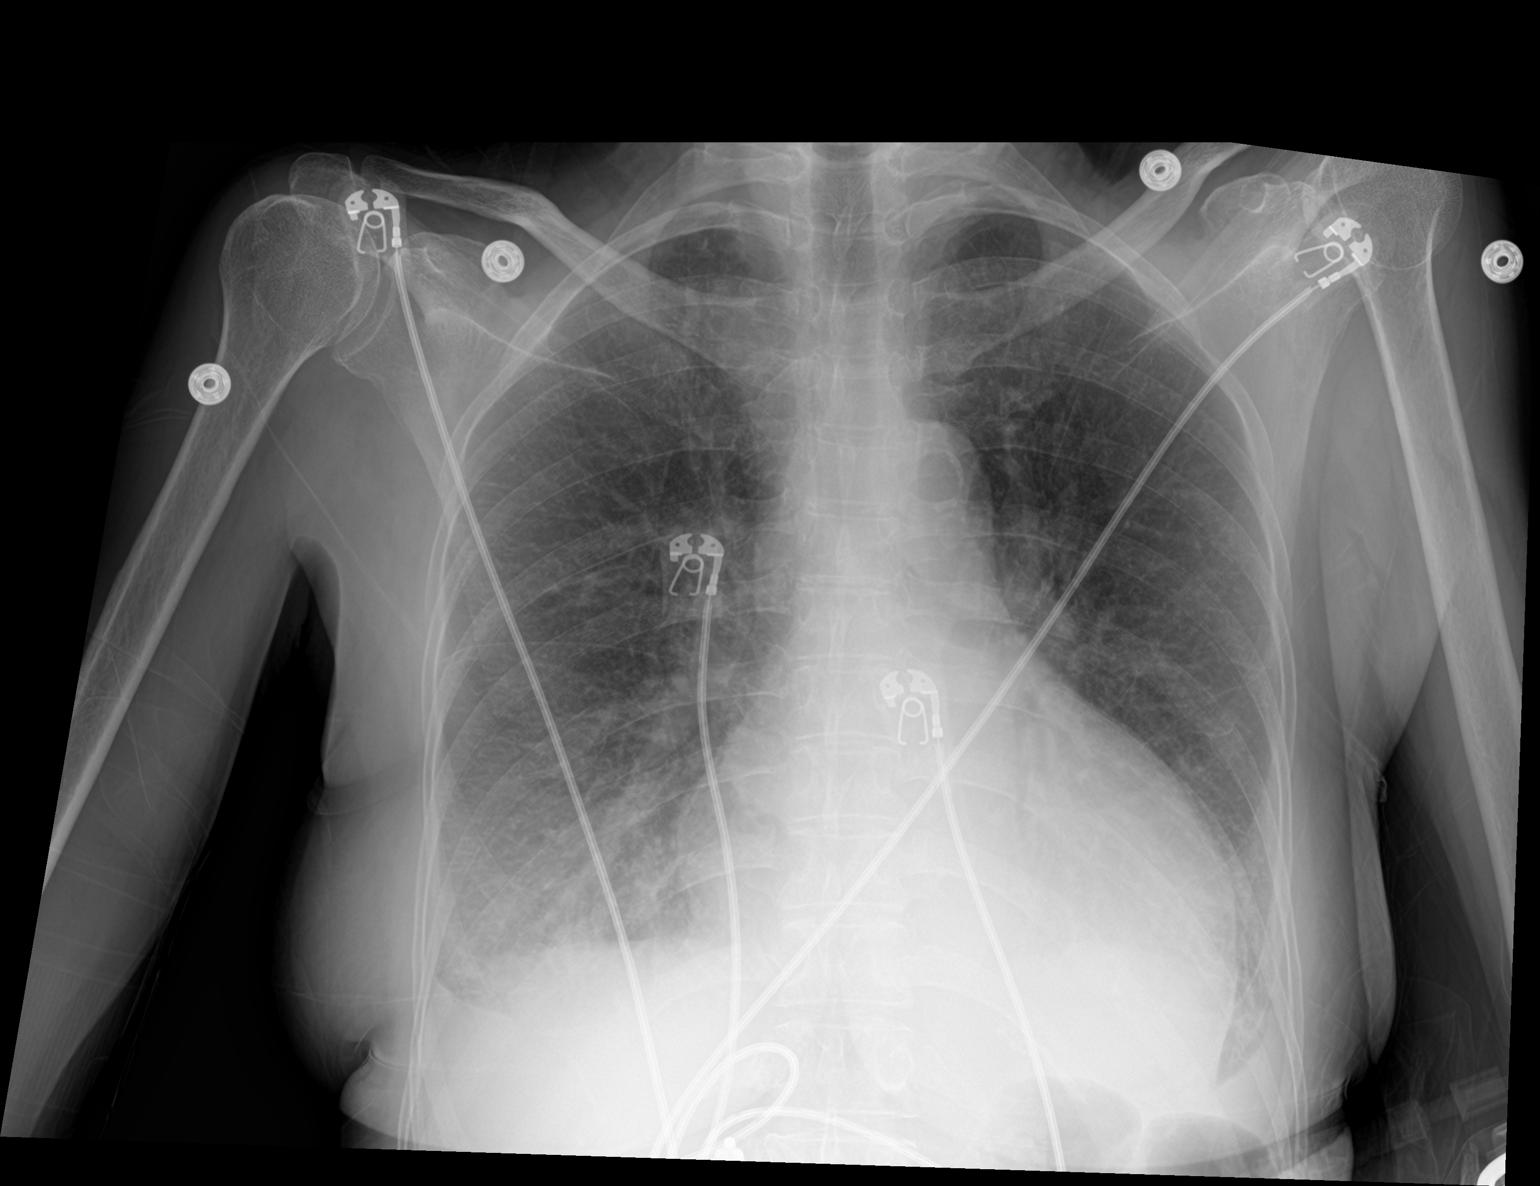

[2 of 2 positions shown; findings below may reference images not displayed]

FINDINGS: Monitoring leads overlie the patient. Stable cardiomegaly. Aortic
atherosclerosis. Bilateral interstitial opacities. Small bilateral
pleural effusions. Thoracic spine degenerative changes.
IMPRESSION: Cardiomegaly with mild interstitial opacities favored to represent
edema. Small bilateral pleural effusions.
